# Patient Record
Sex: Female | Born: 1950 | Race: Black or African American | Hispanic: No | Marital: Single | Smoking: Never smoker
Health system: Southern US, Community
[De-identification: ages and names within clinical notes are randomized; demographics above are authoritative.]

## PROBLEM LIST (undated history)

## (undated) DIAGNOSIS — I10 Essential (primary) hypertension: Secondary | ICD-10-CM

## (undated) DIAGNOSIS — I2510 Atherosclerotic heart disease of native coronary artery without angina pectoris: Secondary | ICD-10-CM

## (undated) DIAGNOSIS — E119 Type 2 diabetes mellitus without complications: Secondary | ICD-10-CM

## (undated) DIAGNOSIS — I509 Heart failure, unspecified: Secondary | ICD-10-CM

## (undated) DIAGNOSIS — N2581 Secondary hyperparathyroidism of renal origin: Secondary | ICD-10-CM

## (undated) DIAGNOSIS — K219 Gastro-esophageal reflux disease without esophagitis: Secondary | ICD-10-CM

## (undated) DIAGNOSIS — Z992 Dependence on renal dialysis: Secondary | ICD-10-CM

## (undated) DIAGNOSIS — D649 Anemia, unspecified: Secondary | ICD-10-CM

## (undated) DIAGNOSIS — J189 Pneumonia, unspecified organism: Secondary | ICD-10-CM

## (undated) DIAGNOSIS — I639 Cerebral infarction, unspecified: Secondary | ICD-10-CM

## (undated) DIAGNOSIS — E785 Hyperlipidemia, unspecified: Secondary | ICD-10-CM

## (undated) DIAGNOSIS — M4842XA Fatigue fracture of vertebra, cervical region, initial encounter for fracture: Secondary | ICD-10-CM

## (undated) DIAGNOSIS — G5621 Lesion of ulnar nerve, right upper limb: Secondary | ICD-10-CM

## (undated) DIAGNOSIS — N186 End stage renal disease: Secondary | ICD-10-CM

## (undated) DIAGNOSIS — E1140 Type 2 diabetes mellitus with diabetic neuropathy, unspecified: Secondary | ICD-10-CM

---

## 2014-06-24 DIAGNOSIS — E119 Type 2 diabetes mellitus without complications: Secondary | ICD-10-CM

## 2014-06-24 DIAGNOSIS — I10 Essential (primary) hypertension: Secondary | ICD-10-CM | POA: Diagnosis present

## 2016-02-25 ENCOUNTER — Other Ambulatory Visit: Payer: Self-pay | Admitting: Cardiology

## 2016-03-01 ENCOUNTER — Ambulatory Visit
Admission: RE | Admit: 2016-03-01 | Discharge: 2016-03-01 | Disposition: A | Discharge status: Home / Self Care | Payer: Medicare Other | Source: Ambulatory Visit | Attending: Cardiology | Admitting: Cardiology

## 2016-03-01 ENCOUNTER — Encounter: Payer: Self-pay | Admitting: *Deleted

## 2016-03-01 ENCOUNTER — Encounter
Admission: RE | Disposition: A | Discharge status: Home / Self Care | Payer: Self-pay | Source: Ambulatory Visit | Attending: Cardiology

## 2016-03-01 DIAGNOSIS — Z8249 Family history of ischemic heart disease and other diseases of the circulatory system: Secondary | ICD-10-CM | POA: Insufficient documentation

## 2016-03-01 DIAGNOSIS — Z7982 Long term (current) use of aspirin: Secondary | ICD-10-CM | POA: Diagnosis not present

## 2016-03-01 DIAGNOSIS — E782 Mixed hyperlipidemia: Secondary | ICD-10-CM | POA: Insufficient documentation

## 2016-03-01 DIAGNOSIS — I10 Essential (primary) hypertension: Secondary | ICD-10-CM | POA: Diagnosis not present

## 2016-03-01 DIAGNOSIS — I259 Chronic ischemic heart disease, unspecified: Secondary | ICD-10-CM | POA: Insufficient documentation

## 2016-03-01 DIAGNOSIS — Z880 Allergy status to penicillin: Secondary | ICD-10-CM | POA: Diagnosis not present

## 2016-03-01 DIAGNOSIS — R0789 Other chest pain: Secondary | ICD-10-CM | POA: Insufficient documentation

## 2016-03-01 DIAGNOSIS — I208 Other forms of angina pectoris: Secondary | ICD-10-CM

## 2016-03-01 DIAGNOSIS — Z794 Long term (current) use of insulin: Secondary | ICD-10-CM | POA: Insufficient documentation

## 2016-03-01 DIAGNOSIS — Z79899 Other long term (current) drug therapy: Secondary | ICD-10-CM | POA: Diagnosis not present

## 2016-03-01 DIAGNOSIS — E119 Type 2 diabetes mellitus without complications: Secondary | ICD-10-CM | POA: Insufficient documentation

## 2016-03-01 DIAGNOSIS — R0600 Dyspnea, unspecified: Secondary | ICD-10-CM | POA: Diagnosis present

## 2016-03-01 HISTORY — PX: CARDIAC CATHETERIZATION: SHX172

## 2016-03-01 HISTORY — DX: Essential (primary) hypertension: I10

## 2016-03-01 HISTORY — DX: Atherosclerotic heart disease of native coronary artery without angina pectoris: I25.10

## 2016-03-01 LAB — GLUCOSE, CAPILLARY: GLUCOSE-CAPILLARY: 100 mg/dL — AB (ref 65–99)

## 2016-03-01 SURGERY — LEFT HEART CATH AND CORONARY ANGIOGRAPHY
Anesthesia: Moderate Sedation | Laterality: Left

## 2016-03-01 MED ORDER — FENTANYL CITRATE (PF) 100 MCG/2ML IJ SOLN
INTRAMUSCULAR | Status: DC | PRN
  Administered 2016-03-01: 25 ug via INTRAVENOUS

## 2016-03-01 MED ORDER — ATROPINE SULFATE 1 MG/10ML IJ SOSY
PREFILLED_SYRINGE | INTRAMUSCULAR | Status: AC
  Filled 2016-03-01: qty 20

## 2016-03-01 MED ORDER — IOPAMIDOL (ISOVUE-300) INJECTION 61%
INTRAVENOUS | Status: DC | PRN
  Administered 2016-03-01: 90 mL via INTRAVENOUS

## 2016-03-01 MED ORDER — SODIUM CHLORIDE 0.9 % WEIGHT BASED INFUSION
3.0000 mL/kg/h | INTRAVENOUS | Status: DC
  Administered 2016-03-01: 3 mL/kg/h via INTRAVENOUS

## 2016-03-01 MED ORDER — SODIUM CHLORIDE 0.9% FLUSH: 3.0000 mL | Freq: Two times a day (BID) | INTRAVENOUS | Status: DC

## 2016-03-01 MED ORDER — MIDAZOLAM HCL 2 MG/2ML IJ SOLN
INTRAMUSCULAR | Status: DC | PRN
  Administered 2016-03-01: 1 mg via INTRAVENOUS

## 2016-03-01 MED ORDER — HEPARIN (PORCINE) IN NACL 2-0.9 UNIT/ML-% IJ SOLN
INTRAMUSCULAR | Status: AC
  Filled 2016-03-01: qty 1000

## 2016-03-01 MED ORDER — EPINEPHRINE HCL 0.1 MG/ML IJ SOSY
PREFILLED_SYRINGE | INTRAMUSCULAR | Status: AC
  Filled 2016-03-01: qty 20

## 2016-03-01 MED ORDER — SODIUM CHLORIDE 0.9% FLUSH: 3.0000 mL | INTRAVENOUS | Status: DC | PRN

## 2016-03-01 MED ORDER — ASPIRIN 81 MG PO CHEW
81.0000 mg | CHEWABLE_TABLET | ORAL | Status: DC
Start: 2016-03-02 — End: 2016-03-01

## 2016-03-01 MED ORDER — FENTANYL CITRATE (PF) 100 MCG/2ML IJ SOLN
INTRAMUSCULAR | Status: AC
  Filled 2016-03-01: qty 2

## 2016-03-01 MED ORDER — MIDAZOLAM HCL 2 MG/2ML IJ SOLN
INTRAMUSCULAR | Status: AC
  Filled 2016-03-01: qty 2

## 2016-03-01 MED ORDER — SODIUM CHLORIDE 0.9 % IV SOLN: 250.0000 mL | INTRAVENOUS | Status: DC | PRN

## 2016-03-01 MED ORDER — SODIUM CHLORIDE 0.9 % WEIGHT BASED INFUSION: 1.0000 mL/kg/h | INTRAVENOUS | Status: DC

## 2016-03-01 MED ORDER — MAGNESIUM SULFATE 50 % IJ SOLN
INTRAMUSCULAR | Status: AC
  Filled 2016-03-01: qty 2

## 2016-03-01 SURGICAL SUPPLY — 9 items
CATH INFINITI 5FR ANG PIGTAIL (CATHETERS) ×3 IMPLANT
CATH INFINITI 5FR JL4 (CATHETERS) ×3 IMPLANT
CATH INFINITI JR4 5F (CATHETERS) ×3 IMPLANT
DEVICE CLOSURE MYNXGRIP 5F (Vascular Products) ×3 IMPLANT
KIT MANI 3VAL PERCEP (MISCELLANEOUS) ×3 IMPLANT
NEEDLE PERC 18GX7CM (NEEDLE) ×3 IMPLANT
PACK CARDIAC CATH (CUSTOM PROCEDURE TRAY) ×3 IMPLANT
SHEATH PINNACLE 5F 10CM (SHEATH) ×3 IMPLANT
WIRE EMERALD 3MM-J .035X150CM (WIRE) ×3 IMPLANT

## 2016-03-01 NOTE — Progress Notes (Signed)
Pt clinically stable post heart cath, no bleeding nor hematoma at right groin site, Dr Ubaldo Glassing in to see pt with questions answered, ready for discharge with rtc appt given,

## 2016-03-01 NOTE — Discharge Instructions (Signed)

## 2017-12-09 ENCOUNTER — Other Ambulatory Visit: Payer: Self-pay

## 2017-12-09 ENCOUNTER — Emergency Department
Admission: EM | Admit: 2017-12-09 | Discharge: 2017-12-09 | Disposition: A | Discharge status: Home / Self Care | Payer: Medicare Other | Attending: Emergency Medicine | Admitting: Emergency Medicine

## 2017-12-09 ENCOUNTER — Emergency Department: Payer: Medicare Other

## 2017-12-09 DIAGNOSIS — R0789 Other chest pain: Secondary | ICD-10-CM | POA: Diagnosis present

## 2017-12-09 DIAGNOSIS — I2510 Atherosclerotic heart disease of native coronary artery without angina pectoris: Secondary | ICD-10-CM | POA: Insufficient documentation

## 2017-12-09 DIAGNOSIS — I10 Essential (primary) hypertension: Secondary | ICD-10-CM | POA: Diagnosis not present

## 2017-12-09 DIAGNOSIS — R0602 Shortness of breath: Secondary | ICD-10-CM | POA: Diagnosis not present

## 2017-12-09 DIAGNOSIS — Z79899 Other long term (current) drug therapy: Secondary | ICD-10-CM | POA: Diagnosis not present

## 2017-12-09 DIAGNOSIS — R079 Chest pain, unspecified: Secondary | ICD-10-CM

## 2017-12-09 DIAGNOSIS — Z7982 Long term (current) use of aspirin: Secondary | ICD-10-CM | POA: Diagnosis not present

## 2017-12-09 LAB — CBC WITH DIFFERENTIAL/PLATELET
Basophils Absolute: 0.1 10*3/uL (ref 0–0.1)
Basophils Relative: 1 %
Eosinophils Absolute: 0.2 10*3/uL (ref 0–0.7)
Eosinophils Relative: 3 %
HEMATOCRIT: 31.7 % — AB (ref 35.0–47.0)
Hemoglobin: 10.3 g/dL — ABNORMAL LOW (ref 12.0–16.0)
LYMPHS ABS: 2 10*3/uL (ref 1.0–3.6)
LYMPHS PCT: 23 %
MCH: 27.3 pg (ref 26.0–34.0)
MCHC: 32.4 g/dL (ref 32.0–36.0)
MCV: 84.2 fL (ref 80.0–100.0)
MONO ABS: 0.6 10*3/uL (ref 0.2–0.9)
MONOS PCT: 7 %
NEUTROS ABS: 5.8 10*3/uL (ref 1.4–6.5)
Neutrophils Relative %: 66 %
Platelets: 218 10*3/uL (ref 150–440)
RBC: 3.77 MIL/uL — ABNORMAL LOW (ref 3.80–5.20)
RDW: 14.6 % — AB (ref 11.5–14.5)
WBC: 8.7 10*3/uL (ref 3.6–11.0)

## 2017-12-09 LAB — TROPONIN I
Troponin I: <0.03 ng/mL (ref <0.03)
Troponin I: <0.03 ng/mL (ref <0.03)

## 2017-12-09 LAB — COMPREHENSIVE METABOLIC PANEL
ALBUMIN: 3.7 g/dL (ref 3.5–5.0)
ALK PHOS: 178 U/L — AB (ref 38–126)
ALT: 26 U/L (ref 14–54)
ANION GAP: 8 (ref 5–15)
AST: 36 U/L (ref 15–41)
BUN: 29 mg/dL — ABNORMAL HIGH (ref 6–20)
CO2: 22 mmol/L (ref 22–32)
Calcium: 9 mg/dL (ref 8.9–10.3)
Chloride: 110 mmol/L (ref 101–111)
Creatinine, Ser: 1.68 mg/dL — ABNORMAL HIGH (ref 0.44–1.00)
GFR calc Af Amer: 36 mL/min — ABNORMAL LOW (ref >60)
GFR calc non Af Amer: 31 mL/min — ABNORMAL LOW (ref >60)
GLUCOSE: 160 mg/dL — AB (ref 65–99)
Potassium: 4.8 mmol/L (ref 3.5–5.1)
SODIUM: 140 mmol/L (ref 135–145)
Total Bilirubin: 0.6 mg/dL (ref 0.3–1.2)
Total Protein: 8 g/dL (ref 6.5–8.1)

## 2017-12-09 MED ORDER — ASPIRIN 81 MG PO CHEW
324.0000 mg | CHEWABLE_TABLET | Freq: Once | ORAL | Status: AC
  Administered 2017-12-09: 324 mg via ORAL
  Filled 2017-12-09: qty 4

## 2017-12-09 NOTE — ED Provider Notes (Signed)
[facility] Provider Note ____________________________________________   I have reviewed the triage vital signs and the triage nursing note.  HISTORY  Chief Complaint Chest Pain   Historian Patient  HPI Deanna [name] is a 67 y.o. female presents by EMS from [facility] with episode of chest pain this morning.  She states that she was bending over to get change when she was down low she had catching pain over the left lower chest wall.  When she stood up she felt like it was severe and sharp and she felt short of breath with it.  Patient states there was a nurse in the area who told her she should go to get checked out.  Patient has primary care at [facility].  Patient reports cardiac cath about 3 years ago and was told there is no problems.  She does take blood pressure medication.  States she was told that she should not take daily aspirin.  But no reported allergy.  No recent illness or coughing or fevers.  No pleuritic chest pain.  Chest pain lasted probably less than 15 or 20 minutes and is currently gone.  Denies indigestion.  Denies trauma or overuse or pulled muscle.     Past Medical History:  Diagnosis Date  . Coronary artery disease   . Hypertension     Patient Active Problem List   Diagnosis Date Noted  . 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 CARDIAC CATHETERIZATION Left 03/01/2016   Procedure: Left Heart Cath and Coronary Angiography;  Surgeon: Teodoro Spray, MD;  Location: [facility];  Service: Cardiovascular;  Laterality: Left;    Prior to Admission medications   Medication Sig Start Date End Date Taking? Authorizing Provider  aspirin EC 81 MG tablet Take 81 mg by mouth daily.    [provider]  atorvastatin (LIPITOR) 40 MG tablet Take 40 mg by mouth daily.    [provider]  gabapentin (NEURONTIN) 100 MG capsule  Take 300 mg by mouth.    [provider]  insulin aspart protamine- aspart (NOVOLOG MIX 70/30) (70-30) 100 UNIT/ML injection Inject into the skin.    [provider]  insulin glargine (LANTUS) 100 UNIT/ML injection Inject 65 Units into the skin at bedtime.    [provider]  lisinopril-hydrochlorothiazide (PRINZIDE,ZESTORETIC) 20-12.5 MG tablet Take 1 tablet by mouth daily.    [provider]  metFORMIN (GLUCOPHAGE) 500 MG tablet Take 500 mg by mouth 2 (two) times daily with a meal.    [provider]  metoprolol succinate (TOPROL-XL) 100 MG 24 hr tablet Take 100 mg by mouth daily. Take with or immediately following a meal.    [provider]    Allergies  Allergen Reactions  . Penicillins Rash    History reviewed. No pertinent family history.  Social History Social History   Tobacco Use  . Smoking status: Never Smoker  Substance Use Topics  . Alcohol use: No  . Drug use: No    Review of Systems  Constitutional: Negative for fever. Eyes: Negative for visual changes. ENT: Negative for sore throat. Cardiovascular: Positive as per HPI for chest pain. Respiratory: She felt a little short of breath during the episode, but none now.  No recent coughing or fevers.   Gastrointestinal: Negative for abdominal pain, vomiting and diarrhea. Genitourinary: Negative for dysuria. Musculoskeletal: Negative for back pain. Skin: Negative for rash. Neurological:  Negative for headache.  ____________________________________________   PHYSICAL EXAM:  VITAL SIGNS: ED Triage Vitals  Enc Vitals Group     BP      Pulse      Resp      Temp      Temp src      SpO2      Weight      Height      Head Circumference      Peak Flow      Pain Score      Pain Loc      Pain Edu?      Excl. in [place]?      Constitutional: Alert and oriented. Well appearing and in no distress. HEENT   Head: Normocephalic and atraumatic.      Eyes:  Conjunctivae are normal. Pupils equal and round.       Ears:         Nose: No congestion/rhinnorhea.   Mouth/Throat: Mucous membranes are moist.   Neck: No stridor. Cardiovascular/Chest: Normal rate, regular rhythm.  No murmurs, rubs, or gallops. Respiratory: Normal respiratory effort without tachypnea nor retractions. Breath sounds are clear and equal bilaterally. No wheezes/rales/rhonchi. Gastrointestinal: Soft. No distention, no guarding, no rebound. Nontender.    Genitourinary/rectal:Deferred Musculoskeletal: Nontender with normal range of motion in all extremities. No joint effusions.  No lower extremity tenderness.  No edema. Neurologic:  Normal speech and language. No gross or focal neurologic deficits are appreciated. Skin:  Skin is warm, dry and intact. No rash noted. Psychiatric: Mood and affect are normal. Speech and behavior are normal. Patient exhibits appropriate insight and judgment.   ____________________________________________  LABS (pertinent positives/negatives) I, Lisa Roca, MD the attending physician have reviewed the labs noted below.  Labs Reviewed  COMPREHENSIVE METABOLIC PANEL - Abnormal; Notable for the following components:      Result Value   Glucose, Bld 160 (*)    BUN 29 (*)    Creatinine, Ser 1.68 (*)    Alkaline Phosphatase 178 (*)    GFR calc non Af Amer 31 (*)    GFR calc Af Amer 36 (*)    All other components within normal limits  CBC WITH DIFFERENTIAL/PLATELET - Abnormal; Notable for the following components:   RBC 3.77 (*)    Hemoglobin 10.3 (*)    HCT 31.7 (*)    RDW 14.6 (*)    All other components within normal limits  TROPONIN I  TROPONIN I    ____________________________________________    EKG I, Lisa Roca, MD, the attending physician have personally viewed and interpreted all ECGs.  94 bpm. normal sinus rhythm.  Narrow QS.  Normal axis.  Normal ST and T  wave ____________________________________________  RADIOLOGY   Chest x-ray two-view reviewed and interpreted by myself: no pneumonia or edema  Radiologist interpretation:FINDINGS: Cardiac contours upper limits of normal. No consolidative pulmonary opacities. No pleural effusion or pneumothorax. Left basilar atelectasis. Thoracic spine degenerative changes.  IMPRESSION: Left basilar atelectasis. __________________________________________  PROCEDURES  Procedure(s) performed: None  Procedures  Critical Care performed: None   ____________________________________________  ED COURSE / ASSESSMENT AND PLAN  Pertinent labs & imaging results that were available during my care of the patient were reviewed by me and considered in my medical decision making (see chart for details).   Chart review indicates patient had seen Dr. Ubaldo Glassing in 2017 and had a cardiac cath at that point time which showed no intervenable lesions.  She did have coronary disease with 100%  RCA lesion but she had collateral flow to the distal RCA.  No interventions at that time.  Does not look like she has had follow-up with cardiology since that point time.  At that point time she was on 81 mg aspirin.  Quite unclear to me why she would be told not to be on aspirin right now.  She reports no history of internal bleeding.  She does have stage III kidney disease.  We discussed that given no allergy, one-time dosing today seems safe and reasonable.  Initial laboratory studies are all reassuring.  As is her initial EKG.  Patient was really hesitant to stay for repeat testing, but chose to repeat at about 2 hours as a compromise.  She understands that typically I would retest at 3-4 hours but she is not willing to wait that long.  Unclear etiology of the chest discomfort episode, but is gone now, seems unlikely ACS.  Potentially musculoskeletal or even esophageal spasm.  We discussed return precautions and were  to follow-up.  DIFFERENTIAL DIAGNOSIS: Differential diagnosis includes, but is not limited to, ACS, aortic dissection, pulmonary embolism, cardiac tamponade, pneumothorax, pneumonia, pericarditis, myocarditis, GI-related causes including esophagitis/gastritis, and musculoskeletal chest wall pain.   CONSULTATIONS:  None   Patient / Family / Caregiver informed of clinical course, medical decision-making process, and agree with plan.   I discussed return precautions, follow-up instructions, and discharge instructions with patient and/or family.  Discharge Instructions :You are evaluated for chest discomfort, and although no certain cause was found, your exam and evaluation are overall reassuring in the emergency department today.  Return to emergency department immediately for any worsening or uncontrolled chest pain, nausea, sweats, weakness, numbness, dizziness passing out, or any other symptoms concerning to you.    ___________________________________________   FINAL CLINICAL IMPRESSION(S) / ED DIAGNOSES   Final diagnoses:  Nonspecific chest pain      ___________________________________________        Note: This dictation was prepared with Dragon dictation. Any transcriptional errors that result from this process are unintentional    Lisa Roca, MD 12/09/17 1439

## 2017-12-09 NOTE — Discharge Instructions (Signed)
You are evaluated for chest discomfort, and although no certain cause was found, your exam and evaluation are overall reassuring in the emergency department today.  Return to emergency department immediately for any worsening or uncontrolled chest pain, nausea, sweats, weakness, numbness, dizziness passing out, or any other symptoms concerning to you.

## 2017-12-09 NOTE — ED Triage Notes (Signed)
Pt arrived via [facility] after she was at work and developed chest pain, on arrival here chest pain has resolved

## 2018-04-18 ENCOUNTER — Other Ambulatory Visit: Payer: Self-pay | Admitting: Internal Medicine

## 2018-04-18 DIAGNOSIS — Z13820 Encounter for screening for osteoporosis: Secondary | ICD-10-CM

## 2018-05-09 ENCOUNTER — Ambulatory Visit
Admission: RE | Admit: 2018-05-09 | Discharge: 2018-05-09 | Disposition: A | Discharge status: Home / Self Care | Payer: Medicare Other | Source: Ambulatory Visit | Attending: Internal Medicine | Admitting: Internal Medicine

## 2018-05-09 DIAGNOSIS — Z13820 Encounter for screening for osteoporosis: Secondary | ICD-10-CM | POA: Insufficient documentation

## 2018-05-09 DIAGNOSIS — M85851 Other specified disorders of bone density and structure, right thigh: Secondary | ICD-10-CM | POA: Diagnosis not present

## 2021-05-27 ENCOUNTER — Other Ambulatory Visit: Payer: Self-pay | Admitting: Family Medicine

## 2021-05-27 ENCOUNTER — Ambulatory Visit
Admission: RE | Admit: 2021-05-27 | Discharge: 2021-05-27 | Disposition: A | Discharge status: Home / Self Care | Payer: Medicare HMO | Source: Ambulatory Visit | Attending: Family Medicine | Admitting: Family Medicine

## 2021-05-27 ENCOUNTER — Ambulatory Visit
Admission: RE | Admit: 2021-05-27 | Discharge: 2021-05-27 | Disposition: A | Discharge status: Home / Self Care | Payer: Medicare HMO | Attending: Family Medicine | Admitting: Family Medicine

## 2021-05-27 DIAGNOSIS — R0609 Other forms of dyspnea: Secondary | ICD-10-CM

## 2021-05-27 DIAGNOSIS — R0600 Dyspnea, unspecified: Secondary | ICD-10-CM

## 2021-09-12 ENCOUNTER — Other Ambulatory Visit: Payer: Self-pay | Admitting: Family Medicine

## 2021-09-12 DIAGNOSIS — Z1231 Encounter for screening mammogram for malignant neoplasm of breast: Secondary | ICD-10-CM

## 2021-09-23 ENCOUNTER — Emergency Department
Admission: EM | Admit: 2021-09-23 | Discharge: 2021-09-23 | Disposition: A | Discharge status: Home / Self Care | Payer: Medicare HMO | Attending: Emergency Medicine | Admitting: Emergency Medicine

## 2021-09-23 ENCOUNTER — Other Ambulatory Visit: Payer: Self-pay

## 2021-09-23 DIAGNOSIS — H6992 Unspecified Eustachian tube disorder, left ear: Secondary | ICD-10-CM | POA: Insufficient documentation

## 2021-09-23 DIAGNOSIS — Z7982 Long term (current) use of aspirin: Secondary | ICD-10-CM | POA: Insufficient documentation

## 2021-09-23 DIAGNOSIS — I2510 Atherosclerotic heart disease of native coronary artery without angina pectoris: Secondary | ICD-10-CM | POA: Insufficient documentation

## 2021-09-23 DIAGNOSIS — Z79899 Other long term (current) drug therapy: Secondary | ICD-10-CM | POA: Insufficient documentation

## 2021-09-23 DIAGNOSIS — I10 Essential (primary) hypertension: Secondary | ICD-10-CM | POA: Insufficient documentation

## 2021-09-23 DIAGNOSIS — Z955 Presence of coronary angioplasty implant and graft: Secondary | ICD-10-CM | POA: Insufficient documentation

## 2021-09-23 DIAGNOSIS — H60392 Other infective otitis externa, left ear: Secondary | ICD-10-CM | POA: Diagnosis not present

## 2021-09-23 DIAGNOSIS — H6121 Impacted cerumen, right ear: Secondary | ICD-10-CM | POA: Insufficient documentation

## 2021-09-23 DIAGNOSIS — H9201 Otalgia, right ear: Secondary | ICD-10-CM | POA: Diagnosis present

## 2021-09-23 DIAGNOSIS — H6982 Other specified disorders of Eustachian tube, left ear: Secondary | ICD-10-CM

## 2021-09-23 MED ORDER — NEOMYCIN-POLYMYXIN-HC 3.5-10000-1 OT SOLN: 3.0000 [drp] | Freq: Four times a day (QID) | OTIC | 0 refills | Status: AC

## 2021-09-23 MED ORDER — FLUTICASONE PROPIONATE 50 MCG/ACT NA SUSP: 1.0000 | Freq: Two times a day (BID) | NASAL | 0 refills | Status: DC

## 2021-09-23 MED ORDER — CARBAMIDE PEROXIDE 6.5 % OT SOLN: 5.0000 [drp] | Freq: Two times a day (BID) | OTIC | 1 refills | Status: DC

## 2021-09-23 NOTE — ED Notes (Signed)
Pt complains of pain in both ears and hearing loss. Denies any other symptom

## 2021-09-23 NOTE — ED Triage Notes (Signed)
C/o hearing loss to right ear x3 months, and acute hearing loss with drainage and pain to left ear x2 weeks. Denies fevers. Pt. States she has had colds with cough intermittently x2 months. No meds today.

## 2021-09-23 NOTE — ED Provider Notes (Signed)
[facility] Emergency Department Provider Note  ____________________________________________  Time seen: Approximately 10:21 PM  I have reviewed the triage vital signs and the nursing notes.   HISTORY  Chief Complaint Ear Drainage (C/o hearing loss to right ear x3 months, and acute hearing loss with drainage and pain to left ear x2 weeks. Denies fevers. Pt. States she has had colds with cough intermittently x2 months.)    HPI Deanna [name] is a 70 y.o. female who presents to the emergency department complaining of bilateral ear issues.  Patient states that she is having some muffled hearing as well as left ear pain to the right, she states that it feels like there is something in her right ear.  No fevers or chills, nasal congestion, sore throat, cough.  She has been using water and hydrogen peroxide in both ears trying to alleviate symptoms.       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 Authorizing Provider  carbamide peroxide (DEBROX) 6.5 % OTIC solution Place 5 drops into the right ear 2 (two) times daily. 09/23/21 09/23/22 Yes Kelsee Preslar, Charline Bills, PA-C  fluticasone (FLONASE) 50 MCG/ACT nasal spray Place 1 spray into both nostrils 2 (two) times daily. 09/23/21  Yes Leviticus Harton, Charline Bills, PA-C  neomycin-polymyxin-hydrocortisone (CORTISPORIN) OTIC solution Place 3 drops into the left ear 4 (four) times daily for 10 days. 09/23/21 10/03/21 Yes Enrrique Mierzwa, Charline Bills, PA-C  aspirin EC 81 MG  tablet Take 81 mg by mouth daily.    [provider]  atorvastatin (LIPITOR) 40 MG tablet Take 40 mg by mouth daily.    [provider]  gabapentin (NEURONTIN) 100 MG capsule Take 300 mg by mouth.    [provider]  insulin aspart protamine- aspart (NOVOLOG MIX 70/30) (70-30) 100 UNIT/ML injection Inject into the skin.    [provider]  insulin glargine (LANTUS) 100 UNIT/ML injection Inject 65 Units into the skin at bedtime.    [provider]  lisinopril-hydrochlorothiazide (PRINZIDE,ZESTORETIC) 20-12.5 MG tablet Take 1 tablet by mouth daily.    [provider]  metFORMIN (GLUCOPHAGE) 500 MG tablet Take 500 mg by mouth 2 (two) times daily with a meal.    [provider]  metoprolol succinate (TOPROL-XL) 100 MG 24 hr tablet Take 100 mg by mouth daily. Take with or immediately following a meal.    [provider]    Allergies Penicillins  No family history on file.  Social History Social History   Tobacco Use   Smoking status: Never  Substance Use Topics   Alcohol use: No   Drug use: No     Review of Systems  Constitutional: No fever/chills Eyes: No visual changes. No discharge ENT: Bilateral ear issues Cardiovascular: no chest pain. Respiratory: no cough. No SOB. Gastrointestinal: No abdominal pain.  No nausea, no vomiting.  No diarrhea.  No constipation. Musculoskeletal: Negative for musculoskeletal pain. Skin: Negative for rash, abrasions, lacerations, ecchymosis. Neurological: Negative for headaches, focal weakness or numbness.  10 System ROS otherwise negative.  ____________________________________________   PHYSICAL EXAM:  VITAL SIGNS: ED Triage Vitals  Enc Vitals Group     BP 09/23/21 2052 (!) 143/71     Pulse Rate 09/23/21 2052 95     Resp --      Temp 09/23/21 2052 98.9 F (37.2 C)     Temp Source 09/23/21 2052 Oral     SpO2 09/23/21 2052 98 %     Weight --      Height --       Head Circumference --      Peak Flow --      Pain Score 09/23/21 2056 0     Pain Loc --      Pain Edu? --      Excl. in [place]? --      Constitutional: Alert and oriented. Well appearing and in no acute distress. Eyes: Conjunctivae are normal. PERRL. EOMI. Head: Atraumatic. ENT:      Ears: Visualization of the left EAC reveals erythema and edema consistent with otitis externa.  Canal is clear of cerumen.  TM is slightly bulging consistent with eustachian tube dysfunction but no injection concerning for otitis media.  EAC on right is completely blocked with hardened cerumen.  Unable to dislodge with ear curette.  Unable to see TM at this time.      Nose: No congestion/rhinnorhea.      Mouth/Throat: Mucous membranes are moist.  Neck: No stridor.   Cardiovascular: Normal rate, regular rhythm. Normal S1 and S2.  Good peripheral circulation. Respiratory: Normal respiratory effort without tachypnea or retractions. Lungs CTAB. Good air entry to the bases with no decreased or absent breath sounds. Musculoskeletal: Full range of motion to all extremities. No gross deformities appreciated. Neurologic:  Normal speech and language. No gross focal neurologic deficits are appreciated.  Skin:  Skin is warm, dry and intact. No rash noted. Psychiatric: Mood and affect are normal. Speech and behavior are normal. Patient exhibits appropriate insight and judgement.   ____________________________________________   LABS (all labs ordered are listed, but only abnormal results are displayed)  Labs Reviewed - No data to display ____________________________________________  EKG   ____________________________________________  RADIOLOGY   No results found.  ____________________________________________    PROCEDURES  Procedure(s) performed:    Procedures    Medications - No data to display   ____________________________________________   INITIAL IMPRESSION / ASSESSMENT AND PLAN / ED  COURSE  Pertinent labs & imaging results that were available during my care of the patient were reviewed by me and considered in my medical decision making (see chart for details).  Review of the [place] CSRS was performed in accordance of the [place] prior to dispensing any controlled drugs.           Patient's diagnosis is consistent with otitis externa and eustachian tube dysfunction to the left ear, cerumen impaction to the right ear.  Patient presents with bilateral ear issues.  Patiently placed on Debrox drops for cerumen impaction, Cortisporin drops for otitis externa and Flonase for eustachian tube dysfunction.  Follow-up primary care or ENT as needed.  Return precautions discussed with the patient..  Patient is given ED precautions to return to the ED for any worsening or new symptoms.     ____________________________________________  FINAL CLINICAL IMPRESSION(S) / ED DIAGNOSES  Final diagnoses:  Impacted cerumen of right ear  Other infective acute otitis externa of left ear  Eustachian tube dysfunction, left      NEW  MEDICATIONS STARTED DURING THIS VISIT:  ED Discharge Orders          Ordered    fluticasone (FLONASE) 50 MCG/ACT nasal spray  2 times daily        09/23/21 2233    carbamide peroxide (DEBROX) 6.5 % OTIC solution  2 times daily        09/23/21 2233    neomycin-polymyxin-hydrocortisone (CORTISPORIN) OTIC solution  4 times daily        09/23/21 2233                This chart was dictated using voice recognition software/Dragon. Despite best efforts to proofread, errors can occur which can change the meaning. Any change was purely unintentional.    Darletta Moll, PA-C 09/23/21 2235    Naaman Plummer, MD 09/24/21 [phone]

## 2021-10-31 DIAGNOSIS — I5020 Unspecified systolic (congestive) heart failure: Secondary | ICD-10-CM | POA: Diagnosis present

## 2021-12-24 DIAGNOSIS — T464X5A Adverse effect of angiotensin-converting-enzyme inhibitors, initial encounter: Secondary | ICD-10-CM

## 2021-12-24 HISTORY — DX: Adverse effect of angiotensin-converting-enzyme inhibitors, initial encounter: T46.4X5A

## 2021-12-26 ENCOUNTER — Emergency Department: Payer: Medicare HMO

## 2021-12-26 ENCOUNTER — Inpatient Hospital Stay
Admission: EM | Admit: 2021-12-26 | Discharge: 2022-01-13 | DRG: 064 | Disposition: A | Discharge status: Home Health | Payer: Medicare HMO | Attending: Internal Medicine | Admitting: Internal Medicine

## 2021-12-26 ENCOUNTER — Other Ambulatory Visit: Payer: Self-pay

## 2021-12-26 DIAGNOSIS — D649 Anemia, unspecified: Secondary | ICD-10-CM | POA: Diagnosis present

## 2021-12-26 DIAGNOSIS — Z7982 Long term (current) use of aspirin: Secondary | ICD-10-CM

## 2021-12-26 DIAGNOSIS — T380X5A Adverse effect of glucocorticoids and synthetic analogues, initial encounter: Secondary | ICD-10-CM | POA: Diagnosis not present

## 2021-12-26 DIAGNOSIS — J122 Parainfluenza virus pneumonia: Secondary | ICD-10-CM | POA: Diagnosis present

## 2021-12-26 DIAGNOSIS — M4802 Spinal stenosis, cervical region: Secondary | ICD-10-CM | POA: Diagnosis present

## 2021-12-26 DIAGNOSIS — R29702 NIHSS score 2: Secondary | ICD-10-CM | POA: Diagnosis not present

## 2021-12-26 DIAGNOSIS — R531 Weakness: Secondary | ICD-10-CM

## 2021-12-26 DIAGNOSIS — Z880 Allergy status to penicillin: Secondary | ICD-10-CM

## 2021-12-26 DIAGNOSIS — I429 Cardiomyopathy, unspecified: Secondary | ICD-10-CM | POA: Diagnosis present

## 2021-12-26 DIAGNOSIS — Z794 Long term (current) use of insulin: Secondary | ICD-10-CM

## 2021-12-26 DIAGNOSIS — R778 Other specified abnormalities of plasma proteins: Secondary | ICD-10-CM

## 2021-12-26 DIAGNOSIS — I639 Cerebral infarction, unspecified: Secondary | ICD-10-CM | POA: Diagnosis not present

## 2021-12-26 DIAGNOSIS — I2510 Atherosclerotic heart disease of native coronary artery without angina pectoris: Secondary | ICD-10-CM | POA: Diagnosis present

## 2021-12-26 DIAGNOSIS — R29706 NIHSS score 6: Secondary | ICD-10-CM | POA: Diagnosis not present

## 2021-12-26 DIAGNOSIS — R10A1 Flank pain, right side: Secondary | ICD-10-CM

## 2021-12-26 DIAGNOSIS — E1165 Type 2 diabetes mellitus with hyperglycemia: Secondary | ICD-10-CM | POA: Diagnosis not present

## 2021-12-26 DIAGNOSIS — Z955 Presence of coronary angioplasty implant and graft: Secondary | ICD-10-CM

## 2021-12-26 DIAGNOSIS — N186 End stage renal disease: Secondary | ICD-10-CM | POA: Diagnosis present

## 2021-12-26 DIAGNOSIS — E785 Hyperlipidemia, unspecified: Secondary | ICD-10-CM | POA: Diagnosis present

## 2021-12-26 DIAGNOSIS — E1140 Type 2 diabetes mellitus with diabetic neuropathy, unspecified: Secondary | ICD-10-CM | POA: Diagnosis present

## 2021-12-26 DIAGNOSIS — J181 Lobar pneumonia, unspecified organism: Secondary | ICD-10-CM

## 2021-12-26 DIAGNOSIS — G8191 Hemiplegia, unspecified affecting right dominant side: Secondary | ICD-10-CM | POA: Diagnosis present

## 2021-12-26 DIAGNOSIS — I10 Essential (primary) hypertension: Secondary | ICD-10-CM | POA: Diagnosis present

## 2021-12-26 DIAGNOSIS — N189 Chronic kidney disease, unspecified: Secondary | ICD-10-CM | POA: Diagnosis present

## 2021-12-26 DIAGNOSIS — Z992 Dependence on renal dialysis: Secondary | ICD-10-CM

## 2021-12-26 DIAGNOSIS — D631 Anemia in chronic kidney disease: Secondary | ICD-10-CM | POA: Diagnosis present

## 2021-12-26 DIAGNOSIS — M6281 Muscle weakness (generalized): Principal | ICD-10-CM

## 2021-12-26 DIAGNOSIS — N289 Disorder of kidney and ureter, unspecified: Secondary | ICD-10-CM

## 2021-12-26 DIAGNOSIS — E871 Hypo-osmolality and hyponatremia: Secondary | ICD-10-CM | POA: Diagnosis present

## 2021-12-26 DIAGNOSIS — N179 Acute kidney failure, unspecified: Secondary | ICD-10-CM | POA: Diagnosis present

## 2021-12-26 DIAGNOSIS — E1122 Type 2 diabetes mellitus with diabetic chronic kidney disease: Secondary | ICD-10-CM | POA: Diagnosis present

## 2021-12-26 DIAGNOSIS — E119 Type 2 diabetes mellitus without complications: Secondary | ICD-10-CM

## 2021-12-26 DIAGNOSIS — Z7984 Long term (current) use of oral hypoglycemic drugs: Secondary | ICD-10-CM

## 2021-12-26 DIAGNOSIS — Z9181 History of falling: Secondary | ICD-10-CM

## 2021-12-26 DIAGNOSIS — E875 Hyperkalemia: Secondary | ICD-10-CM

## 2021-12-26 DIAGNOSIS — I5020 Unspecified systolic (congestive) heart failure: Secondary | ICD-10-CM | POA: Diagnosis present

## 2021-12-26 DIAGNOSIS — J9601 Acute respiratory failure with hypoxia: Secondary | ICD-10-CM | POA: Diagnosis present

## 2021-12-26 DIAGNOSIS — R200 Anesthesia of skin: Secondary | ICD-10-CM

## 2021-12-26 DIAGNOSIS — N2581 Secondary hyperparathyroidism of renal origin: Secondary | ICD-10-CM | POA: Diagnosis present

## 2021-12-26 DIAGNOSIS — B181 Chronic viral hepatitis B without delta-agent: Secondary | ICD-10-CM | POA: Diagnosis present

## 2021-12-26 DIAGNOSIS — E8809 Other disorders of plasma-protein metabolism, not elsewhere classified: Secondary | ICD-10-CM | POA: Diagnosis present

## 2021-12-26 DIAGNOSIS — E8889 Other specified metabolic disorders: Secondary | ICD-10-CM | POA: Diagnosis present

## 2021-12-26 DIAGNOSIS — Z888 Allergy status to other drugs, medicaments and biological substances status: Secondary | ICD-10-CM

## 2021-12-26 DIAGNOSIS — R29701 NIHSS score 1: Secondary | ICD-10-CM | POA: Diagnosis present

## 2021-12-26 DIAGNOSIS — R109 Unspecified abdominal pain: Secondary | ICD-10-CM

## 2021-12-26 DIAGNOSIS — Z20822 Contact with and (suspected) exposure to covid-19: Secondary | ICD-10-CM | POA: Diagnosis present

## 2021-12-26 DIAGNOSIS — D472 Monoclonal gammopathy: Secondary | ICD-10-CM | POA: Diagnosis present

## 2021-12-26 DIAGNOSIS — R29703 NIHSS score 3: Secondary | ICD-10-CM | POA: Diagnosis not present

## 2021-12-26 DIAGNOSIS — J9811 Atelectasis: Secondary | ICD-10-CM | POA: Diagnosis present

## 2021-12-26 DIAGNOSIS — I5023 Acute on chronic systolic (congestive) heart failure: Secondary | ICD-10-CM | POA: Diagnosis present

## 2021-12-26 DIAGNOSIS — N170 Acute kidney failure with tubular necrosis: Secondary | ICD-10-CM | POA: Diagnosis present

## 2021-12-26 DIAGNOSIS — Z79899 Other long term (current) drug therapy: Secondary | ICD-10-CM

## 2021-12-26 DIAGNOSIS — J1008 Influenza due to other identified influenza virus with other specified pneumonia: Secondary | ICD-10-CM | POA: Diagnosis present

## 2021-12-26 DIAGNOSIS — R7989 Other specified abnormal findings of blood chemistry: Secondary | ICD-10-CM

## 2021-12-26 DIAGNOSIS — R29898 Other symptoms and signs involving the musculoskeletal system: Secondary | ICD-10-CM | POA: Diagnosis present

## 2021-12-26 DIAGNOSIS — I132 Hypertensive heart and chronic kidney disease with heart failure and with stage 5 chronic kidney disease, or end stage renal disease: Secondary | ICD-10-CM | POA: Diagnosis present

## 2021-12-26 DIAGNOSIS — E876 Hypokalemia: Secondary | ICD-10-CM | POA: Diagnosis present

## 2021-12-26 LAB — COMPREHENSIVE METABOLIC PANEL
ALT: 8 U/L (ref 0–44)
AST: 13 U/L — ABNORMAL LOW (ref 15–41)
Albumin: 3.2 g/dL — ABNORMAL LOW (ref 3.5–5.0)
Alkaline Phosphatase: 116 U/L (ref 38–126)
Anion gap: 10 (ref 5–15)
BUN: 47 mg/dL — ABNORMAL HIGH (ref 8–23)
CO2: 22 mmol/L (ref 22–32)
Calcium: 7.4 mg/dL — ABNORMAL LOW (ref 8.9–10.3)
Chloride: 104 mmol/L (ref 98–111)
Creatinine, Ser: 3.84 mg/dL — ABNORMAL HIGH (ref 0.44–1.00)
GFR, Estimated: 12 mL/min — ABNORMAL LOW (ref >60)
Glucose, Bld: 117 mg/dL — ABNORMAL HIGH (ref 70–99)
Potassium: 3.2 mmol/L — ABNORMAL LOW (ref 3.5–5.1)
Sodium: 136 mmol/L (ref 135–145)
Total Bilirubin: 0.4 mg/dL (ref 0.3–1.2)
Total Protein: 7.6 g/dL (ref 6.5–8.1)

## 2021-12-26 LAB — DIFFERENTIAL
Abs Immature Granulocytes: 0.05 10*3/uL (ref 0.00–0.07)
Basophils Absolute: 0 10*3/uL (ref 0.0–0.1)
Basophils Relative: 0 %
Eosinophils Absolute: 0.1 10*3/uL (ref 0.0–0.5)
Eosinophils Relative: 1 %
Immature Granulocytes: 1 %
Lymphocytes Relative: 20 %
Lymphs Abs: 1.9 10*3/uL (ref 0.7–4.0)
Monocytes Absolute: 0.6 10*3/uL (ref 0.1–1.0)
Monocytes Relative: 7 %
Neutro Abs: 6.9 10*3/uL (ref 1.7–7.7)
Neutrophils Relative %: 71 %

## 2021-12-26 LAB — CBC
HCT: 32.8 % — ABNORMAL LOW (ref 36.0–46.0)
Hemoglobin: 10.2 g/dL — ABNORMAL LOW (ref 12.0–15.0)
MCH: 26.5 pg (ref 26.0–34.0)
MCHC: 31.1 g/dL (ref 30.0–36.0)
MCV: 85.2 fL (ref 80.0–100.0)
Platelets: 234 10*3/uL (ref 150–400)
RBC: 3.85 MIL/uL — ABNORMAL LOW (ref 3.87–5.11)
RDW: 14.6 % (ref 11.5–15.5)
WBC: 9.6 10*3/uL (ref 4.0–10.5)
nRBC: 0 % (ref 0.0–0.2)

## 2021-12-26 LAB — PROTIME-INR
INR: 1 (ref 0.8–1.2)
Prothrombin Time: 13.1 seconds (ref 11.4–15.2)

## 2021-12-26 LAB — URINALYSIS, ROUTINE W REFLEX MICROSCOPIC
Bacteria, UA: NONE SEEN
Bilirubin Urine: NEGATIVE
Glucose, UA: 150 mg/dL — AB
Ketones, ur: NEGATIVE mg/dL
Nitrite: NEGATIVE
Protein, ur: 100 mg/dL — AB
Specific Gravity, Urine: 1.011 (ref 1.005–1.030)
pH: 5 (ref 5.0–8.0)

## 2021-12-26 LAB — APTT: aPTT: 30 seconds (ref 24–36)

## 2021-12-26 LAB — TROPONIN I (HIGH SENSITIVITY)
Troponin I (High Sensitivity): 22 ng/L — ABNORMAL HIGH (ref <18)
Troponin I (High Sensitivity): 24 ng/L — ABNORMAL HIGH (ref <18)

## 2021-12-26 LAB — BRAIN NATRIURETIC PEPTIDE: B Natriuretic Peptide: 203 pg/mL — ABNORMAL HIGH (ref 0.0–100.0)

## 2021-12-26 MED ORDER — SODIUM CHLORIDE 0.9% FLUSH: 3.0000 mL | Freq: Once | INTRAVENOUS | Status: DC

## 2021-12-26 MED ORDER — POTASSIUM CHLORIDE 10 MEQ/100ML IV SOLN
10.0000 meq | INTRAVENOUS | Status: AC
  Administered 2021-12-26 – 2021-12-27 (×2): 10 meq via INTRAVENOUS
  Filled 2021-12-26: qty 100

## 2021-12-26 MED ORDER — SODIUM CHLORIDE 0.9 % IV BOLUS
1000.0000 mL | Freq: Once | INTRAVENOUS | Status: AC
  Administered 2021-12-26: 1000 mL via INTRAVENOUS

## 2021-12-26 NOTE — ED Notes (Signed)
Report received from Katie, RN

## 2021-12-26 NOTE — ED Notes (Signed)
Dr. Patel at the bedside  

## 2021-12-26 NOTE — Assessment & Plan Note (Addendum)
Continue insulin Semglee.  NovoLog and sliding scale insulin.   ? ?

## 2021-12-26 NOTE — Assessment & Plan Note (Addendum)
Right-sided weakness not explained by MRI of the brain.  Neurology ordered MRI of the cervical spine and thoracic spine and an MRI of the brain. ? ?

## 2021-12-26 NOTE — ED Triage Notes (Addendum)
Pt comes with c/o numbness two days ago. Pt also recently started on new medication and since has been having several issues. Pt has been falling, feeling weak and dizziness.  ? ?Pt states she can't lift her right arm. Pt states the symptoms come and go.  ? ? ?

## 2021-12-26 NOTE — Assessment & Plan Note (Addendum)
Patient is euvolemic at this point.  With acute kidney injury holding diuretics.  Repeating echo with stroke. Last EF last year 30%. ?

## 2021-12-26 NOTE — Assessment & Plan Note (Addendum)
Restart Toprol low-dose at night. ?

## 2021-12-26 NOTE — Consult Note (Signed)
[facility] ?[facility] Tele[facility] Consult [facility] ? ?Stat Consult ? ?Patient Name:   Deanna [name], Deanna [name] ?Date of Birth:   03-Mar-1951 ?Identification Number:   MRN - [id number] ?Date of Service:   12/26/2021 20:23:05 ? ?Diagnosis: ?      I63.9 - Cerebrovascular accident (CVA), unspecified mechanism ([place]) ? ?Impression ?Patient presents to the hospital for right-sided sensorimotor symptoms. Onset 2 days ago, well outside the window for any acute stroke therapies. Recommend admission to the hospital for stroke work-up. MRI brain without contrast, TTE. Continue aspirin. Neuro follow-up. ? ?Our recommendations are outlined below. ? ?Disposition: ?Neurology will follow ? ? ? ?---------------------------------------------------------------------------------------------------- ?Advanced Imaging: ?Advanced Imaging Not Completed because: ? ?Subacute strokes on CT and/or LKW beyond 6-24 hrs per guidelines for thrombectomy consideration, vascular studies can be completed routinely on admission ? ? ? ?Metrics: ?[facility] Notification Time: 12/26/2021 20:21:00 ?Stamp Time: 12/26/2021 20:23:05 ?Callback Response Time: 12/26/2021 20:23:31 ? ? ?CT HEAD: ?As Per Radiologist CT Head Showed No Acute Hemorrhage or Acute Core Infarct ? ? ? ?---------------------------------------------------------------------------------------------------- ? ?Chief Complaint: ?Right-sided weakness ? ?History of Present Illness: ?Patient is a 71 year old Female. ?Patient presents to the hospital for right-sided weakness. Started 2 days ago. Involving the arm and leg. Never had this before. Denies any history of stroke. She does have a history of CKD, cardiomyopathy, CHF, hypertension, hyperlipidemia, diabetes, monoclonal gammopathy. On exam she has decreased sensation on the right side as well as right-sided weakness when compared to the left. She also seems to be having some pain in her right shoulder and right arm as well that seems to  be limiting some movements in her right arm which she says is also new over the past 2 days. ? ? ?Past Medical History: ?Other PMH:  See HPI Above ? ?Medications: ? ?No Anticoagulant use  ?Antiplatelet use: Yes asa ?Reviewed EMR for current medications ? ?Allergies:  ?Reviewed ? ?Social History: ?Drug Use: No ? ?Family History: ? ?There is no family history of premature cerebrovascular disease pertinent to this consultation ? ?ROS : ?14 Points Review of Systems was performed and was negative except mentioned in HPI. ? ?Past Surgical History: ?There Is No Surgical History Contributory To Today?s Visit ? ? ?Examination: ?BP(/), Pulse(80), ?1A: Level of Consciousness - Alert; keenly responsive + 0 ?1B: Ask Month and Age - Both Questions Right + 0 ?1C: Blink Eyes & Squeeze Hands - Performs Both Tasks + 0 ?2: Test Horizontal Extraocular Movements - Normal + 0 ?3: Test Visual Fields - No Visual Loss + 0 ?4: Test Facial Palsy (Use Grimace if Obtunded) - Normal symmetry + 0 ?5A: Test Left Arm Motor Drift - No Drift for 10 Seconds + 0 ?5B: Test Right Arm Motor Drift - No Effort Against Gravity + 3 ?6A: Test Left Leg Motor Drift - No Drift for 5 Seconds + 0 ?6B: Test Right Leg Motor Drift - Some Effort Against Gravity + 2 ?7: Test Limb Ataxia (FNF/Heel-Shin) - No Ataxia + 0 ?8: Test Sensation - Mild-Moderate Loss: Less Sharp/More Dull + 1 ?9: Test Language/Aphasia - Normal; No aphasia + 0 ?10: Test Dysarthria - Normal + 0 ?11: Test Extinction/Inattention - No abnormality + 0 ? ?NIHSS Score: 6 ? ? ? ? ?Patient / Family was informed the Neurology Consult would occur via TeleHealth consult by way of interactive audio and video telecommunications and consented to receiving care in this manner. ? ?Patient is being evaluated for possible acute neurologic impairment and high probability of imminent or life -  threatening deterioration.I spent total of 35 minutes providing care to this patient, including time for face to face visit  via telemedicine, review of medical records, imaging studies and discussion of findings with providers, the patient and / or family. ? ? ?Dr Knox Royalty ? ? ?[facility] ?[phone] ? ?Case [id number] ? ?

## 2021-12-26 NOTE — ED Notes (Signed)
ED Provider at bedside. 

## 2021-12-26 NOTE — ED Provider Notes (Signed)
? ?[facility] ?Provider Note ? ? Event Date/Time  ? First MD Initiated Contact with Patient 12/26/21 1839   ?  (approximate) ?History  ?Numbness ? ?HPI ?Deanna [name] is a 71 y.o. female with a stated past medical history of hypertension, CAD, and type 2 diabetes who presents for right upper and lower extremity numbness/weakness that began 2 days prior to arrival and has been worsening since onset.  Patient states that she cannot walk without dragging her right foot and has had to walk with a walker over the past 2 days.  Patient states that she has had recent medication changes but cannot tell me exactly what medications were changed.  Patient states that she is currently on gabapentin for neuropathy at 300 3 times daily but it is "not working anymore".  Patient also endorsing decreased p.o. intake over the last few weeks as well.  Patient denies any headache, vision changes, slurred speech, facial droop, or weakness/numbness/paresthesias in the left side ?Physical Exam  ?Triage Vital Signs: ?ED Triage Vitals  ?Enc Vitals Group  ?   BP 12/26/21 1621 139/75  ?   Pulse Rate 12/26/21 1621 89  ?   Resp 12/26/21 1621 19  ?   Temp 12/26/21 1621 98 ?F (36.7 ?C)  ?   Temp src --   ?   SpO2 12/26/21 1621 100 %  ?   Weight --   ?   Height --   ?   Head Circumference --   ?   Peak Flow --   ?   Pain Score 12/26/21 1619 5  ?   Pain Loc --   ?   Pain Edu? --   ?   Excl. in [place]? --   ? ?Most recent vital signs: ?Vitals:  ? 12/26/21 2145 12/26/21 2200  ?BP: (!) 163/86 (!) 168/83  ?Pulse: 94 (!) 101  ?Resp: 14 19  ?Temp:    ?SpO2: 100% 100%  ? ?General: Awake, oriented x4. ?CV:  Good peripheral perfusion.  ?Resp:  Normal effort.  ?Abd:  No distention.  ?Other:  Patient is an elderly African-American female laying in bed in no distress.  Significant weakness to the right upper extremity grip.  If I hold patient's right arm up, she is able to keep it straight out in front of her without drift however she is  unable to lift her arm into that position.  Patient's left lower extremity weakness and drift ?ED Results / Procedures / Treatments  ?Labs ?(all labs ordered are listed, but only abnormal results are displayed) ?Labs Reviewed  ?CBC - Abnormal; Notable for the following components:  ?    Result Value  ? RBC 3.85 (*)   ? Hemoglobin 10.2 (*)   ? HCT 32.8 (*)   ? All other components within normal limits  ?COMPREHENSIVE METABOLIC PANEL - Abnormal; Notable for the following components:  ? Potassium 3.2 (*)   ? Glucose, Bld 117 (*)   ? BUN 47 (*)   ? Creatinine, Ser 3.84 (*)   ? Calcium 7.4 (*)   ? Albumin 3.2 (*)   ? AST 13 (*)   ? GFR, Estimated 12 (*)   ? All other components within normal limits  ?BRAIN NATRIURETIC PEPTIDE - Abnormal; Notable for the following components:  ? B Natriuretic Peptide 203.0 (*)   ? All other components within normal limits  ?URINALYSIS, ROUTINE W REFLEX MICROSCOPIC - Abnormal; Notable for the following components:  ? Color, Urine  YELLOW (*)   ? APPearance HAZY (*)   ? Glucose, UA 150 (*)   ? Hgb urine dipstick SMALL (*)   ? Protein, ur 100 (*)   ? Leukocytes,Ua TRACE (*)   ? All other components within normal limits  ?TROPONIN I (HIGH SENSITIVITY) - Abnormal; Notable for the following components:  ? Troponin I (High Sensitivity) 22 (*)   ? All other components within normal limits  ?TROPONIN I (HIGH SENSITIVITY) - Abnormal; Notable for the following components:  ? Troponin I (High Sensitivity) 24 (*)   ? All other components within normal limits  ?DIFFERENTIAL  ?PROTIME-INR  ?APTT  ?CBG MONITORING, ED  ? ?EKG ?ED ECG REPORT ?I, Naaman Plummer, the attending physician, personally viewed and interpreted this ECG. ?Date: 12/26/2021 ?EKG Time: 1623 ?Rate: 91 ?Rhythm: normal sinus rhythm ?QRS Axis: normal ?Intervals: normal ?ST/T Wave abnormalities: normal ?Narrative Interpretation: no evidence of acute ischemia ?RADIOLOGY ?ED MD interpretation: CT of the head without contrast interpreted by me  shows no evidence of acute abnormalities including no intracerebral hemorrhage, obvious masses, or significant edema ?-Agree with radiology assessment ?Official radiology report(s): ?CT HEAD WO CONTRAST ? ?Result Date: 12/26/2021 ?CLINICAL DATA:  Intermittent numbness, weakness, and dizziness. Unable to lift right arm. EXAM: CT HEAD WITHOUT CONTRAST TECHNIQUE: Contiguous axial images were obtained from the base of the skull through the vertex without intravenous contrast. RADIATION DOSE REDUCTION: This exam was performed according to the departmental dose-optimization program which includes automated exposure control, adjustment of the mA and/or kV according to patient size and/or use of iterative reconstruction technique. COMPARISON:  None. FINDINGS: Brain: No evidence of acute infarction, hemorrhage, hydrocephalus, extra-axial collection or mass lesion/mass effect. Cerebral volume within normal limits for age. Scattered mild periventricular and subcortical white matter hypodensities are nonspecific, but favored to reflect chronic microvascular ischemic changes. Vascular: Atherosclerotic vascular calcification of the carotid siphons. No hyperdense vessel. Skull: Normal. Negative for fracture or focal lesion. Sinuses/Orbits: No acute finding. Scattered mild paranasal sinus mucosal thickening. The orbits are unremarkable. Other: None. IMPRESSION: 1. No acute intracranial abnormality. 2. Mild chronic microvascular ischemic changes. Electronically Signed   By: Titus Dubin M.D.   On: 12/26/2021 16:52   ?PROCEDURES: ?Critical Care performed: No ?.1-3 Lead EKG Interpretation ?Performed by: Naaman Plummer, MD ?Authorized by: Naaman Plummer, MD  ? ?  Interpretation: abnormal   ?  ECG rate:  102 ?  ECG rate assessment: tachycardic   ?  Rhythm: sinus tachycardia   ?  Ectopy: none   ?  Conduction: normal   ?MEDICATIONS ORDERED IN ED: ?Medications  ?potassium chloride 10 mEq in 100 mL IVPB (0 mEq Intravenous Paused 12/26/21  2225)  ?sodium chloride 0.9 % bolus 1,000 mL (0 mLs Intravenous Paused 12/26/21 2239)  ? ?IMPRESSION / MDM / ASSESSMENT AND PLAN / ED COURSE  ?I reviewed the triage vital signs and the nursing notes. ?             ?               ?The patient is on the cardiac monitor to evaluate for evidence of arrhythmia and/or significant heart rate changes. ?Patient is a 71 year old female who presents with symptoms concerning for nonacute stroke ?PMH risk factors: Hypertension, hyperlipidemia ?Neurologic Deficits: Right upper and lower extremity weakness, right upper and lower extremity numbness ?Last known Well Time: 2 days prior to arrival ?NIH Stroke Score: 4 ?Given History and Exam I have lower suspicion for infectious  etiology, neurologic changes secondary to toxicologic ingestion, seizure, complex migraine. ?Presentation concerning for possible stroke requiring workup. ? ?Workup: ?Labs: POC glucose, CBC, BMP, LFTs, Troponin, PT/INR, PTT, Type and Screen ?Other Diagnostics: ECG, CXR, non-contrast head CT followed by CTA brain and neck (to r/o large vessel occlusion amenable to thrombectomy) ?Interventions: Patient's not eligible for TPA due to time since symptom onset ? ?Consult: hospitalist ?Disposition: Admit ? ?  ?FINAL CLINICAL IMPRESSION(S) / ED DIAGNOSES  ? ?Final diagnoses:  ?Muscle weakness of right upper extremity  ?Weakness of right lower extremity  ?Right sided numbness  ? ?Rx / DC Orders  ? ?ED Discharge Orders   ? ? None  ? ?  ? ?Note:  This document was prepared using Dragon voice recognition software and may include unintentional dictation errors. ?  ?Naaman Plummer, MD ?12/26/21 2315 ? ?

## 2021-12-26 NOTE — ED Notes (Signed)
Pt to MRI

## 2021-12-26 NOTE — H&P (Signed)
?History and Physical  ? ? ?PatientSkyelyn [name] EXH:[id number] DOB: March 07, 1951 ?DOA: 12/26/2021 ?DOS: the patient was seen and examined on 12/27/2021 ?PCP: [facility] [place]  ?Patient coming from: Home ? ?Chief Complaint:  ?Chief Complaint  ?Patient presents with  ? Numbness  ? ?HPI: Deanna [name] is a 71 y.o. female with medical history significant of hypertension, CAD with recent stents presenting with right-sided weakness, worse since yesterday evening.  Daughter states that she thinks that after having the cath in the right arm she started having weakness in the right arm discussed with her that usually cardiac catheterization does not cause weakness she also has weakness in her right leg.  Patient is otherwise unable to tell me any other details but denies any complaints and does not report any headaches blurred vision speech or chest pain/ palpitation / abd pain fevers chills or any other complaints.  ? ?In the emergency room today patient hemoglobin of 10.2, otherwise normal CBC, CMP shows potassium of 3.2 glucose 117 creatinine of 3.84 with a GFR of 12, troponin of 22 with repeat  of 24, patient's BNP is 3.0, CBC shows a hemoglobin of 10.0 and otherwise normal white count and platelet count. ? ?Review of Systems: Review of Systems  ?Neurological:  Positive for weakness.  ?All other systems reviewed and are negative. ? ?Past Medical History:  ?Diagnosis Date  ? Coronary artery disease   ? Hypertension   ? ?Past Surgical History:  ?Procedure Laterality Date  ? CARDIAC CATHETERIZATION Left 03/01/2016  ? Procedure: Left Heart Cath and Coronary Angiography;  Surgeon: Teodoro Spray, MD;  Location: [facility];  Service: Cardiovascular;  Laterality: Left;  ? ?Social History:  reports that she has never smoked. She does not have any smokeless tobacco history on file. She reports that she does not drink alcohol and does not use drugs. ? ?Allergies  ?Allergen Reactions  ? Penicillins  Rash  ? ? ?No family history on file. ? ?Prior to Admission medications   ?Medication Sig Start Date End Date Taking? Authorizing Provider  ?aspirin EC 81 MG tablet Take 81 mg by mouth daily.    [provider]  ?atorvastatin (LIPITOR) 40 MG tablet Take 40 mg by mouth daily.    [provider]  ?carbamide peroxide (DEBROX) 6.5 % OTIC solution Place 5 drops into the right ear 2 (two) times daily. 09/23/21 09/23/22  Cuthriell, Charline Bills, PA-C  ?fluticasone (FLONASE) 50 MCG/ACT nasal spray Place 1 spray into both nostrils 2 (two) times daily. 09/23/21   Cuthriell, Charline Bills, PA-C  ?gabapentin (NEURONTIN) 100 MG capsule Take 300 mg by mouth.    [provider]  ?insulin aspart protamine- aspart (NOVOLOG MIX 70/30) (70-30) 100 UNIT/ML injection Inject into the skin.    [provider]  ?insulin glargine (LANTUS) 100 UNIT/ML injection Inject 65 Units into the skin at bedtime.    [provider]  ?lisinopril-hydrochlorothiazide (PRINZIDE,ZESTORETIC) 20-12.5 MG tablet Take 1 tablet by mouth daily.    [provider]  ?metFORMIN (GLUCOPHAGE) 500 MG tablet Take 500 mg by mouth 2 (two) times daily with a meal.    [provider]  ?metoprolol succinate (TOPROL-XL) 100 MG 24 hr tablet Take 100 mg by mouth daily. Take with or immediately following a meal.    [provider]  ? ? ?Physical Exam: ?Vitals:  ? 12/26/21 2310 12/26/21 2330 12/27/21 0000 12/27/21 0030  ?BP: (!) 169/85 (!) 168/70 (!) 151/72 (!) 158/73  ?  Pulse: 90 89 91 91  ?Resp: '12 14 20 '$ (!) 24  ?Temp:      ?SpO2: 100% 100% 100% 100%  ?Physical Exam ?Vitals and nursing note reviewed.  ?Constitutional:   ?   General: She is not in acute distress. ?   Appearance: Normal appearance. She is not ill-appearing, toxic-appearing or diaphoretic.  ?HENT:  ?   Head: Normocephalic and atraumatic.  ?   Right Ear: Hearing and external ear normal.  ?   Left Ear: Hearing and external ear normal.  ?   Nose: Nose  normal. No nasal deformity.  ?   Mouth/Throat:  ?   Lips: Pink.  ?   Mouth: Mucous membranes are moist.  ?   Tongue: No lesions. Tongue does not deviate from midline.  ?   Pharynx: Oropharynx is clear.  ?Eyes:  ?   Extraocular Movements: Extraocular movements intact.  ?   Pupils: Pupils are equal, round, and reactive to light.  ?Neck:  ?   Vascular: No carotid bruit.  ?Cardiovascular:  ?   Rate and Rhythm: Normal rate and regular rhythm.  ?   Pulses: Normal pulses.  ?   Heart sounds: Normal heart sounds.  ?Pulmonary:  ?   Effort: Pulmonary effort is normal.  ?   Breath sounds: Normal breath sounds.  ?Abdominal:  ?   General: Bowel sounds are normal. There is no distension.  ?   Palpations: Abdomen is soft. There is no mass.  ?   Tenderness: There is no abdominal tenderness. There is no guarding.  ?   Hernia: No hernia is present.  ?Musculoskeletal:  ?   Right lower leg: No edema.  ?   Left lower leg: No edema.  ?Skin: ?   General: Skin is warm.  ?Neurological:  ?   Mental Status: She is alert and oriented to person, place, and time.  ?   Cranial Nerves: Cranial nerves 2-12 are intact. No cranial nerve deficit, dysarthria or facial asymmetry.  ?   Motor: Weakness present.  ?   Deep Tendon Reflexes:  ?   Reflex Scores: ?     Bicep reflexes are 1+ on the right side and 1+ on the left side. ?     Patellar reflexes are 1+ on the right side and 1+ on the left side. ?   Comments: Weakness on right side and unable to move the [place] and RLE. ?  ?Psychiatric:     ?   Attention and Perception: Attention normal.     ?   Mood and Affect: Mood normal.     ?   Speech: Speech normal.     ?   Behavior: Behavior normal.     ?   Cognition and Memory: Cognition normal.  ? ? ?Data Reviewed: ?Results for orders placed or performed during the hospital encounter of 12/26/21 (from the past 24 hour(s))  ?CBC     Status: Abnormal  ? Collection Time: 12/26/21  4:21 PM  ?Result Value Ref Range  ? WBC 9.6 4.0 - 10.5 K/uL  ? RBC 3.85 (L) 3.87 -  5.11 MIL/uL  ? Hemoglobin 10.2 (L) 12.0 - 15.0 g/dL  ? HCT 32.8 (L) 36.0 - 46.0 %  ? MCV 85.2 80.0 - 100.0 fL  ? MCH 26.5 26.0 - 34.0 pg  ? MCHC 31.1 30.0 - 36.0 g/dL  ? RDW 14.6 11.5 - 15.5 %  ? Platelets 234 150 - 400 K/uL  ? nRBC 0.0 0.0 -  0.2 %  ?Differential     Status: None  ? Collection Time: 12/26/21  4:21 PM  ?Result Value Ref Range  ? Neutrophils Relative % 71 %  ? Neutro Abs 6.9 1.7 - 7.7 K/uL  ? Lymphocytes Relative 20 %  ? Lymphs Abs 1.9 0.7 - 4.0 K/uL  ? Monocytes Relative 7 %  ? Monocytes Absolute 0.6 0.1 - 1.0 K/uL  ? Eosinophils Relative 1 %  ? Eosinophils Absolute 0.1 0.0 - 0.5 K/uL  ? Basophils Relative 0 %  ? Basophils Absolute 0.0 0.0 - 0.1 K/uL  ? Immature Granulocytes 1 %  ? Abs Immature Granulocytes 0.05 0.00 - 0.07 K/uL  ?Comprehensive metabolic panel     Status: Abnormal  ? Collection Time: 12/26/21  4:21 PM  ?Result Value Ref Range  ? Sodium 136 135 - 145 mmol/L  ? Potassium 3.2 (L) 3.5 - 5.1 mmol/L  ? Chloride 104 98 - 111 mmol/L  ? CO2 22 22 - 32 mmol/L  ? Glucose, Bld 117 (H) 70 - 99 mg/dL  ? BUN 47 (H) 8 - 23 mg/dL  ? Creatinine, Ser 3.84 (H) 0.44 - 1.00 mg/dL  ? Calcium 7.4 (L) 8.9 - 10.3 mg/dL  ? Total Protein 7.6 6.5 - 8.1 g/dL  ? Albumin 3.2 (L) 3.5 - 5.0 g/dL  ? AST 13 (L) 15 - 41 U/L  ? ALT 8 0 - 44 U/L  ? Alkaline Phosphatase 116 38 - 126 U/L  ? Total Bilirubin 0.4 0.3 - 1.2 mg/dL  ? GFR, Estimated 12 (L) >60 mL/min  ? Anion gap 10 5 - 15  ?Brain natriuretic peptide     Status: Abnormal  ? Collection Time: 12/26/21  4:21 PM  ?Result Value Ref Range  ? B Natriuretic Peptide 203.0 (H) 0.0 - 100.0 pg/mL  ?Urinalysis, Routine w reflex microscopic     Status: Abnormal  ? Collection Time: 12/26/21  4:21 PM  ?Result Value Ref Range  ? Color, Urine YELLOW (A) YELLOW  ? APPearance HAZY (A) CLEAR  ? Specific Gravity, Urine 1.011 1.005 - 1.030  ? pH 5.0 5.0 - 8.0  ? Glucose, UA 150 (A) NEGATIVE mg/dL  ? Hgb urine dipstick SMALL (A) NEGATIVE  ? Bilirubin Urine NEGATIVE NEGATIVE  ? Ketones,  ur NEGATIVE NEGATIVE mg/dL  ? Protein, ur 100 (A) NEGATIVE mg/dL  ? Nitrite NEGATIVE NEGATIVE  ? Leukocytes,Ua TRACE (A) NEGATIVE  ? RBC / HPF 0-5 0 - 5 RBC/hpf  ? WBC, UA 0-5 0 - 5 WBC/hpf  ? Bacteria, UA

## 2021-12-26 NOTE — ED Notes (Addendum)
Teleneurologist at the bedside evaluating patient ?

## 2021-12-26 NOTE — ED Notes (Signed)
Called pt's grand-daughter, [name], and provided an update. States she is on the way here to see pt ?

## 2021-12-27 ENCOUNTER — Observation Stay: Payer: Medicare HMO

## 2021-12-27 ENCOUNTER — Encounter: Payer: Self-pay | Admitting: Internal Medicine

## 2021-12-27 DIAGNOSIS — D649 Anemia, unspecified: Secondary | ICD-10-CM | POA: Diagnosis present

## 2021-12-27 DIAGNOSIS — I639 Cerebral infarction, unspecified: Secondary | ICD-10-CM | POA: Diagnosis not present

## 2021-12-27 DIAGNOSIS — E876 Hypokalemia: Secondary | ICD-10-CM | POA: Diagnosis present

## 2021-12-27 DIAGNOSIS — J181 Lobar pneumonia, unspecified organism: Secondary | ICD-10-CM

## 2021-12-27 DIAGNOSIS — R531 Weakness: Secondary | ICD-10-CM

## 2021-12-27 DIAGNOSIS — N179 Acute kidney failure, unspecified: Secondary | ICD-10-CM | POA: Diagnosis not present

## 2021-12-27 DIAGNOSIS — N189 Chronic kidney disease, unspecified: Secondary | ICD-10-CM

## 2021-12-27 DIAGNOSIS — E119 Type 2 diabetes mellitus without complications: Secondary | ICD-10-CM

## 2021-12-27 LAB — CBC
HCT: 32.7 % — ABNORMAL LOW (ref 36.0–46.0)
Hemoglobin: 10.3 g/dL — ABNORMAL LOW (ref 12.0–15.0)
MCH: 26.8 pg (ref 26.0–34.0)
MCHC: 31.5 g/dL (ref 30.0–36.0)
MCV: 84.9 fL (ref 80.0–100.0)
Platelets: 231 10*3/uL (ref 150–400)
RBC: 3.85 MIL/uL — ABNORMAL LOW (ref 3.87–5.11)
RDW: 14.6 % (ref 11.5–15.5)
WBC: 12.3 10*3/uL — ABNORMAL HIGH (ref 4.0–10.5)
nRBC: 0 % (ref 0.0–0.2)

## 2021-12-27 LAB — URINE DRUG SCREEN, QUALITATIVE (ARMC ONLY)
Amphetamines, Ur Screen: NOT DETECTED
Barbiturates, Ur Screen: NOT DETECTED
Benzodiazepine, Ur Scrn: NOT DETECTED
Cannabinoid 50 Ng, Ur ~~LOC~~: NOT DETECTED
Cocaine Metabolite,Ur ~~LOC~~: NOT DETECTED
MDMA (Ecstasy)Ur Screen: NOT DETECTED
Methadone Scn, Ur: NOT DETECTED
Opiate, Ur Screen: NOT DETECTED
Phencyclidine (PCP) Ur S: NOT DETECTED
Tricyclic, Ur Screen: NOT DETECTED

## 2021-12-27 LAB — HIV ANTIBODY (ROUTINE TESTING W REFLEX): HIV Screen 4th Generation wRfx: NONREACTIVE

## 2021-12-27 LAB — GLUCOSE, CAPILLARY
Glucose-Capillary: 152 mg/dL — ABNORMAL HIGH (ref 70–99)
Glucose-Capillary: 124 mg/dL — ABNORMAL HIGH (ref 70–99)
Glucose-Capillary: 146 mg/dL — ABNORMAL HIGH (ref 70–99)

## 2021-12-27 LAB — LIPID PANEL
Cholesterol: 182 mg/dL (ref 0–200)
HDL: 80 mg/dL (ref >40)
LDL Cholesterol: 87 mg/dL (ref 0–99)
Total CHOL/HDL Ratio: 2.3 RATIO
Triglycerides: 77 mg/dL (ref <150)
VLDL: 15 mg/dL (ref 0–40)

## 2021-12-27 LAB — RESP PANEL BY RT-PCR (FLU A&B, COVID) ARPGX2
Influenza A by PCR: NEGATIVE
Influenza B by PCR: NEGATIVE
SARS Coronavirus 2 by RT PCR: NEGATIVE

## 2021-12-27 LAB — CREATININE, SERUM
Creatinine, Ser: 3.49 mg/dL — ABNORMAL HIGH (ref 0.44–1.00)
GFR, Estimated: 14 mL/min — ABNORMAL LOW (ref >60)

## 2021-12-27 MED ORDER — ACETAMINOPHEN 325 MG PO TABS
650.0000 mg | ORAL_TABLET | ORAL | Status: DC | PRN
  Administered 2021-12-27 – 2022-01-06 (×11): 650 mg via ORAL
  Filled 2021-12-27 (×11): qty 2

## 2021-12-27 MED ORDER — ENSURE ENLIVE PO LIQD
237.0000 mL | Freq: Two times a day (BID) | ORAL | Status: DC
  Administered 2021-12-28 – 2022-01-01 (×9): 237 mL via ORAL

## 2021-12-27 MED ORDER — AZITHROMYCIN 500 MG PO TABS
250.0000 mg | ORAL_TABLET | Freq: Every day | ORAL | Status: AC
  Administered 2021-12-28 – 2021-12-31 (×4): 250 mg via ORAL
  Filled 2021-12-27 (×4): qty 1

## 2021-12-27 MED ORDER — ACETAMINOPHEN 650 MG RE SUPP: 650.0000 mg | RECTAL | Status: DC | PRN

## 2021-12-27 MED ORDER — AZITHROMYCIN 500 MG PO TABS
500.0000 mg | ORAL_TABLET | Freq: Every day | ORAL | Status: AC
  Administered 2021-12-27: 500 mg via ORAL
  Filled 2021-12-27: qty 1

## 2021-12-27 MED ORDER — STROKE: EARLY STAGES OF RECOVERY BOOK: Freq: Once | Status: DC

## 2021-12-27 MED ORDER — INSULIN ASPART 100 UNIT/ML IJ SOLN
0.0000 [IU] | Freq: Three times a day (TID) | INTRAMUSCULAR | Status: DC
  Administered 2021-12-31: 5 [IU] via SUBCUTANEOUS
  Administered 2021-12-31: 3 [IU] via SUBCUTANEOUS
  Administered 2021-12-31: 2 [IU] via SUBCUTANEOUS
  Administered 2022-01-01: 5 [IU] via SUBCUTANEOUS
  Filled 2021-12-27 (×6): qty 1

## 2021-12-27 MED ORDER — ATORVASTATIN CALCIUM 20 MG PO TABS
40.0000 mg | ORAL_TABLET | Freq: Every evening | ORAL | Status: DC
  Administered 2021-12-27: 40 mg via ORAL
  Filled 2021-12-27: qty 2

## 2021-12-27 MED ORDER — ASPIRIN 300 MG RE SUPP
300.0000 mg | Freq: Every day | RECTAL | Status: DC
  Filled 2021-12-27: qty 1

## 2021-12-27 MED ORDER — ACETAMINOPHEN 160 MG/5ML PO SOLN
650.0000 mg | ORAL | Status: DC | PRN
  Filled 2021-12-27: qty 20.3

## 2021-12-27 MED ORDER — INSULIN ASPART 100 UNIT/ML IJ SOLN
0.0000 [IU] | Freq: Every day | INTRAMUSCULAR | Status: DC
  Administered 2021-12-30: 2 [IU] via SUBCUTANEOUS
  Administered 2021-12-31: 3 [IU] via SUBCUTANEOUS
  Administered 2022-01-02 (×2): 5 [IU] via SUBCUTANEOUS
  Administered 2022-01-03: 4 [IU] via SUBCUTANEOUS
  Filled 2021-12-27 (×5): qty 1

## 2021-12-27 MED ORDER — SODIUM CHLORIDE 0.9 % IV SOLN
2.0000 g | INTRAVENOUS | Status: AC
  Administered 2021-12-27 – 2021-12-31 (×5): 2 g via INTRAVENOUS
  Filled 2021-12-27 (×5): qty 2

## 2021-12-27 MED ORDER — CLOPIDOGREL BISULFATE 75 MG PO TABS
75.0000 mg | ORAL_TABLET | Freq: Every day | ORAL | Status: DC
  Administered 2021-12-27 – 2021-12-28 (×2): 75 mg via ORAL
  Filled 2021-12-27 (×2): qty 1

## 2021-12-27 MED ORDER — ASPIRIN 325 MG PO TABS
325.0000 mg | ORAL_TABLET | Freq: Every day | ORAL | Status: DC
  Administered 2021-12-27: 325 mg via ORAL
  Filled 2021-12-27: qty 1

## 2021-12-27 MED ORDER — METOPROLOL SUCCINATE ER 50 MG PO TB24
25.0000 mg | ORAL_TABLET | Freq: Every day | ORAL | Status: DC
  Administered 2021-12-27 – 2022-01-07 (×11): 25 mg via ORAL
  Filled 2021-12-27 (×12): qty 1

## 2021-12-27 MED ORDER — ASPIRIN EC 81 MG PO TBEC
81.0000 mg | DELAYED_RELEASE_TABLET | Freq: Every day | ORAL | Status: DC
Start: 2021-12-28 — End: 2022-01-13
  Administered 2021-12-28 – 2022-01-12 (×16): 81 mg via ORAL
  Filled 2021-12-27 (×16): qty 1

## 2021-12-27 MED ORDER — SODIUM CHLORIDE 0.9 % IV SOLN: INTRAVENOUS | Status: DC

## 2021-12-27 MED ORDER — ADULT MULTIVITAMIN W/MINERALS CH
1.0000 | ORAL_TABLET | Freq: Every day | ORAL | Status: DC
  Administered 2021-12-27 – 2022-01-04 (×8): 1 via ORAL
  Filled 2021-12-27 (×8): qty 1

## 2021-12-27 MED ORDER — HEPARIN SODIUM (PORCINE) 5000 UNIT/ML IJ SOLN
5000.0000 [IU] | Freq: Three times a day (TID) | INTRAMUSCULAR | Status: DC
  Administered 2021-12-27 – 2022-01-07 (×31): 5000 [IU] via SUBCUTANEOUS
  Filled 2021-12-27 (×33): qty 1

## 2021-12-27 NOTE — Evaluation (Signed)
Physical Therapy Evaluation Patient Details Name: Deanna [name] MRN: [id number] DOB: 22-Jan-1951 Today's Date: 12/27/2021  History of Present Illness  71 y.o. female with medical history significant of hypertension, CAD with recent stents presenting with right-sided weakness, worse since yesterday evening. 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 No  hemorrhage or mass effect.  Clinical Impression  Pt seen by OT earlier this date and she had issues with dizziness while trying to move/standing.  No symptoms during PT exam, took BPs multiple times and multiple postures with no overt or symptomatic changes and consistent readings in the 140-150s/70-90 range.  Pt reports she is unable to do more than a few steps at a time with the walker and someone close in her apartment, reports she has fallen multiple times in the bathroom (even with help) and does not hae grab bars, etc in her bathroom, recommending bed side commode with rails for safety.  Pt was able to ambulate more than her self reported baseline, was highly fatigued with the effort after the 15 ft she managed today.     Recommendations for follow up therapy are one component of a multi-disciplinary discharge planning process, led by the attending physician.  Recommendations may be updated based on patient status, additional functional criteria and insurance authorization.  Follow Up Recommendations Home health PT    Assistance Recommended at Discharge Intermittent Supervision/Assistance  Patient can return home with the following  Help with stairs or ramp for entrance;Assist for transportation;Assistance with cooking/housework;A little help with walking and/or transfers;A little help with bathing/dressing/bathroom    Equipment Recommendations BSC/3in1  Recommendations for Other Services       Functional Status Assessment Patient has  had a recent decline in their functional status and demonstrates the ability to make significant improvements in function in a reasonable and predictable amount of time.     Precautions / Restrictions Precautions Precautions: Fall Restrictions Weight Bearing Restrictions: No      Mobility  Bed Mobility Overal bed mobility: Needs Assistance Bed Mobility: Supine to Sit     Supine to sit: Supervision     General bed mobility comments: Pt able to get herself to sitting w/o assist, or hesitation.  No lighthheadedness, etc    Transfers Overall transfer level: Modified independent Equipment used: Rolling walker (2 wheels)               General transfer comment: heavy forward lean and use of UEs, but able to rise w/o direct assist and only minimal additional cuing for set up and encouragement    Ambulation/Gait Ambulation/Gait assistance: Min guard Gait Distance (Feet): 15 Feet Assistive device: Rolling walker (2 wheels)         General Gait Details: Pt with some R LE WBing hesitancy that she reports as near baseline/not new.  She did not show a lot of confidence but did not have any LOBs, dizziness or buckling and was able to walk at least as far as her normal self reported in-home typical limit.  Stairs            Wheelchair Mobility    Modified Rankin (Stroke Patients Only)       Balance Overall balance assessment: Needs assistance Sitting-balance support: No upper extremity supported, Feet supported Sitting balance-[name]: Fair Sitting balance - Comments: no dizziness reported or overt symptoms in seated     Standing balance-[name]: Fair Standing balance comment: reliant  on walker, poor standing tolerance                             Pertinent Vitals/Pain Pain Assessment Pain Assessment: Faces Faces Pain Scale: Hurts a little bit Pain Location: chest pain when she coughs    Home Living Family/patient expects to be  discharged to:: Private residence Living Arrangements: Other relatives (grandchildren) Available Help at Discharge: Family;Available 24 hours/day Type of Home: Apartment Home Access: Stairs to enter Entrance Stairs-Rails: Right Entrance Stairs-Number of Steps: 14   Home Layout: One level Home Equipment: Building control surveyor (4 wheels)      Prior Function Prior Level of Function : Needs assist       Physical Assist : Mobility (physical);ADLs (physical) Mobility (physical): Transfers;Gait;Stairs ADLs (physical): Bathing;Dressing;Toileting;IADLs Mobility Comments: Pt reports that she always has someone with her but does not always need phyiscal assist, reports functional mobility of short distances (~24ft) with RW, and heavy assist stair navigation. Pt endorses countless falls (d/t knee buckling and "passing out"). Pt reports that her caregivers have fallen when providing assistance during mobility ADLs Comments: At baseline, pt is independent with feeding, seated grooming, seated UB dressing, and seated peri-care. Family assists with LB dressing, bathing, and toilet transfers     Hand Dominance   Dominant Hand: Right    Extremity/Trunk Assessment   Upper Extremity Assessment Upper Extremity Assessment: Generalized weakness;RUE deficits/detail (strength in b/l UE is symmetrical. Sensation RUE<LUE. Coordination WFL) RUE Deficits / Details: functional t/o, appears to have = strength, AROM and coordination in b/l UEs RUE Sensation: decreased light touch RUE Coordination: WNL    Lower Extremity Assessment Lower Extremity Assessment: Overall WFL for tasks assessed (mild hesitancy with R LE movement vs L, but functional t/o)       Communication   Communication: No difficulties  Cognition Arousal/Alertness: Awake/alert Behavior During Therapy: WFL for tasks assessed/performed Overall Cognitive Status: Within Functional Limits for tasks assessed                                           General Comments General comments (skin integrity, edema, etc.): While sitting EOB, pt reporting dizziness and visual changes. BP supine: 136/83, BP sitting EOB 142/78    Exercises     Assessment/Plan    PT Assessment Patient needs continued PT services  PT Problem List Decreased strength;Decreased range of motion;Decreased activity tolerance;Decreased balance;Decreased mobility;Decreased knowledge of use of DME;Decreased safety awareness;Cardiopulmonary status limiting activity       PT Treatment Interventions DME instruction;Gait training;Stair training;Functional mobility training;Therapeutic activities;Therapeutic exercise;Balance training;Neuromuscular re-education;Patient/family education    PT Goals (Current goals can be found in the Care Plan section)  Acute Rehab PT Goals Patient Stated Goal: Go home PT Goal Formulation: With patient Time For Goal Achievement: 01/10/22 Potential to Achieve Goals: Good    Frequency Min 2X/week     Co-evaluation               AM-PAC PT "6 Clicks" Mobility  Outcome Measure Help needed turning from your back to your side while in a flat bed without using bedrails?: A Little Help needed moving from lying on your back to sitting on the side of a flat bed without using bedrails?: A Little Help needed moving to and from a bed to a chair (including a wheelchair)?: A Little Help needed  standing up from a chair using your arms (e.g., wheelchair or bedside chair)?: A Little Help needed to walk in hospital room?: A Lot Help needed climbing 3-5 steps with a railing? : A Lot 6 Click Score: 16    End of Session Equipment Utilized During Treatment: Gait belt Activity Tolerance: Patient limited by fatigue Patient left: with chair alarm set;with call bell/phone within reach Nurse Communication: Mobility status PT Visit Diagnosis: Muscle weakness (generalized) (M62.81);Difficulty in walking, not elsewhere classified  (R26.2);History of falling (Z91.81)    Time: 6962-9528 PT Time Calculation (min) (ACUTE ONLY): 25 min   Charges:   PT Evaluation $PT Eval Low Complexity: 1 Low PT Treatments $Gait Training: 8-22 mins        Malachi Pro, DPT 12/27/2021, 1:34 PM

## 2021-12-27 NOTE — Care Management (Signed)
RNCM attempted to see patient, patient requested to be left alone so she can sleep.  RNCM will return for assessment. ?

## 2021-12-27 NOTE — Assessment & Plan Note (Addendum)
Continue aspirin Plavix and Lipitor.  LDL 87 ?

## 2021-12-27 NOTE — Progress Notes (Signed)
Initial Nutrition Assessment ? ?DOCUMENTATION CODES:  ? ?Not applicable ? ?INTERVENTION:  ?- Liberalize diet from a heart healthy to a regular diet to provide widest variety of menu options to enhance nutritional adequacy as pt continues with poor PO intake ? ?- Ensure Enlive po BID, each supplement provides 350 kcal and 20 grams of protein. ? ?- MVI with minerals daily ? ?NUTRITION DIAGNOSIS:  ? ?Inadequate oral intake related to poor appetite, early satiety as evidenced by per patient/family report. ? ?GOAL:  ? ?Patient will meet greater than or equal to 90% of their needs ? ?MONITOR:  ? ?PO intake, Supplement acceptance, Diet advancement, Labs, Weight trends ? ?REASON FOR ASSESSMENT:  ? ?Consult ?Assessment of nutrition requirement/status ? ?ASSESSMENT:  ? ?Pt admitted from home with R sided weakness, admitted to r/o stroke. PMH significant for HTN and CAD with recent stents. ? ?Pt expressed frustrations as she has been trying to rest and has not been able to. Pt reports poor appetite and PO intake for the past 4-5 months since starting a new medication. She endorses early satiety and has only been taking a few bites of food as able. She denies nausea, vomiting, and difficulty with chewing and swallowing.  ? ?Pt states that prior to these past 4-5 months, her usual weight was 180 lbs and she is now 148 lbs. Unfortunately, unable to review this weight loss per review of chart as there is limited documentation of recent weight history. Current admit weight noted to be 149 lbs. Will continue to monitor throughout admission. ? ?Medications: SSI, IV abx ? ?Labs: potassium 3.2, BUN 47, Cr 3.49, corrected calcium 8.04, AST 13, CBG 152 ? ?NUTRITION - FOCUSED PHYSICAL EXAM: ?Deferred to follow up. Pt declined. ? ?Diet Order:   ?Diet Order   ? ?       ?  Diet Heart Room service appropriate? Yes; Fluid consistency: Thin  Diet effective now       ?  ? ?  ?  ? ?  ? ? ?EDUCATION NEEDS:  ? ?No education needs have been  identified at this time ? ?Skin:  Skin Assessment: Reviewed RN Assessment ? ?Last BM:  4/2 ? ?Height:  ? ?Ht Readings from Last 1 Encounters:  ?12/27/21 '6\' 1"'$  (1.854 m)  ? ? ?Weight:  ? ?Wt Readings from Last 1 Encounters:  ?12/27/21 67.8 kg  ? ?BMI:  Body mass index is 19.71 kg/m?. ? ?Estimated Nutritional Needs:  ? ?Kcal:  1700-1900 ? ?Protein:  85-100g ? ?Fluid:  >/=1.7L ? ?Clayborne Dana, RDN, LDN ?Clinical Nutrition ?

## 2021-12-27 NOTE — Evaluation (Addendum)
Occupational Therapy Evaluation ?Patient Details ?Name: Deanna [name] ?MRN: [id number] ?DOB: 01-28-1951 ?Today's Date: 12/27/2021 ? ? ?History of Present Illness 71 y.o. female with medical history significant of hypertension, CAD with recent stents presenting with right-sided weakness, worse since yesterday evening. 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 No  hemorrhage or mass effect.  ? ?Clinical Impression ?  ?Pt seen for OT evaluation this date. Upon arrival to room, pt awake and sitting upright in bed. Pt was observed to have R lateral lean while sitting in bed. Pt A&Ox4 and agreeable to OT eval/tx. Pt reports that at baseline, she is independent with feeding, seated grooming, seated UB dressing, and seated peri-care. Pt reports that her grandchildren assist with LB dressing, bathing, and all mobility (recieves +2 assist for ADL transfers). Pt endorses that she has had countless falls over the past 12 months (d/t knee buckling and "passing out"). Pt reports that her caregivers have fallen when providing assistance during mobility. ? ?Pt performed bed mobility requiring only SUPERVISION d/t pt reporting dizziness and visual changes ("seeing yellow dots") when sitting EOB. Pt returned to supine and BP obtained (136/83). Following second supine>sit transfer, pt continuing to c/o of dizziness and visual changes (BP 142/78). Pt unable to engage in seated UB ADLs while seated EOB d/t decreased sitting balance and pt reporting worsening of symptoms and initiating return to supine. RN & MD informed and present during session. While supine, pt endorsed decreased RUE sensation, however coordination WFL and strength 3/5 (symmetrical to LUE). Pt able to wash face with washcloth using RUE and requiring only SET-UP assist. Pt would benefit from additional skilled OT services to maximize return to PLOF and minimize risk  of future falls, injury, caregiver burden, and readmission. Upon discharge, recommend HHOT as pt appears to be nearing her baseline.      ?   ? ?Recommendations for follow up therapy are one component of a multi-disciplinary discharge planning process, led by the attending physician.  Recommendations may be updated based on patient status, additional functional criteria and insurance authorization.  ? ?Follow Up Recommendations ? Home health OT  ?  ?Assistance Recommended at Discharge Frequent or constant Supervision/Assistance  ?Patient can return home with the following Two people to help with walking and/or transfers;Two people to help with bathing/dressing/bathroom;Assistance with cooking/housework;Help with stairs or ramp for entrance ? ?  ?Functional Status Assessment ? Patient has had a recent decline in their functional status and demonstrates the ability to make significant improvements in function in a reasonable and predictable amount of time.  ?Equipment Recommendations ? Other (comment) (defer to next venue of care)  ?  ?   ?Precautions / Restrictions Precautions ?Precautions: Fall ?Restrictions ?Weight Bearing Restrictions: No  ? ?  ? ?Mobility Bed Mobility ?Overal bed mobility: Needs Assistance ?Bed Mobility: Supine to Sit, Sit to Supine ?  ?  ?Supine to sit: Supervision, HOB elevated ?Sit to supine: Supervision, HOB elevated ?  ?General bed mobility comments: Requires increased time/effort and use of bedrails. Requires supervision 2/2 pt reporting dizziness and visual changes when sitting EOB ?  ? ?Transfers ?  ?  ?  ?  ?  ?  ?  ?  ?  ?General transfer comment: pt unable to attempt this date 2/2 pt reporting dizziness and visual changes when sitting EOB ?  ? ?  ?Balance Overall balance assessment: Needs assistance ?Sitting-balance support: No upper extremity  supported, Feet supported ?Sitting balance-[name]: Poor ?Sitting balance - Comments: While sitting upright in bed, pt demonstrates R  lateral lean. While sitting EOB, pt demonstrates R lateral and posterior lean with UE unsupported ?Postural control: Right lateral lean, Posterior lean ?  ?  ?  ?  ?  ?  ?  ?  ?  ?  ?  ?  ?  ?  ?   ? ?ADL either performed or assessed with clinical judgement  ? ?ADL Overall ADL's : Needs assistance/impaired ?  ?  ?Grooming: Dance movement psychotherapist;Set up;Bed level ?Grooming Details (indicate cue type and reason): With bed in chair position, pt able to use RUE to wash face with washcloth ?  ?  ?  ?  ?  ?  ?  ?  ?  ?  ?  ?  ?  ?  ?  ?General ADL Comments: Unable to complete ADLs while seated EOB 2/2 suspected vasovagal episode; RN & MD informed and present during session  ? ? ? ?Vision Ability to See in Adequate Light: 0 Adequate ?Patient Visual Report: Other (comment) (reports seeing "yellow dots" when sitting upright. No visual deficits when in bed) ?   ?   ?   ?   ? ?Pertinent Vitals/Pain Pain Assessment ?Pain Assessment: Faces ?Faces Pain Scale: Hurts a little bit ?Pain Location: chest pain ?Pain Descriptors / Indicators: Aching ?Pain Intervention(s): Monitored during session, Other (comment) (RN & MD informed)  ? ? ? ?Hand Dominance Right ?  ?Extremity/Trunk Assessment Upper Extremity Assessment ?Upper Extremity Assessment: Generalized weakness;RUE deficits/detail (strength in b/l UE is symmetrical. Sensation RUE<LUE. Coordination WFL) ?RUE Deficits / Details: grossly at least 3/5 in all movements. Pt endorses decreased light touch. ?RUE Sensation: decreased light touch ?RUE Coordination: WNL ?  ?Lower Extremity Assessment ?Lower Extremity Assessment: Generalized weakness ?  ?  ?  ?Communication Communication ?Communication: No difficulties ?  ?Cognition Arousal/Alertness: Awake/alert ?Behavior During Therapy: [facility] for tasks assessed/performed ?Overall Cognitive Status: Within Functional Limits for tasks assessed ?  ?  ?  ?  ?  ?  ?  ?  ?  ?  ?  ?  ?  ?  ?  ?  ?General Comments: A&Ox4 ?  ?  ?General Comments  While sitting  EOB, pt reporting dizziness and visual changes. BP supine: 136/83, BP sitting EOB 142/78 ? ?  ?   ?   ? ? ?Home Living Family/patient expects to be discharged to:: Private residence ?Living Arrangements: Other (Comment) (grandson and granddaughter) ?Available Help at Discharge: Family;Available 24 hours/day ?Type of Home: Apartment ?Home Access: Stairs to enter ?Entrance Stairs-Number of Steps: 14 ?Entrance Stairs-Rails: Right ?Home Layout: One level ?  ?  ?Bathroom Shower/Tub: Tub/shower unit ?  ?Bathroom Toilet: Standard ?  ?  ?Home Equipment: Associate Professor (4 wheels) ?  ?  ?  ? ?  ?Prior Functioning/Environment Prior Level of Function : Needs assist ?  ?  ?  ?Physical Assist : Mobility (physical);ADLs (physical) ?Mobility (physical): Transfers;Gait;Stairs ?ADLs (physical): Bathing;Dressing;Toileting;IADLs ?Mobility Comments: At baseline, pt reports that she requires +2 assist for sit>stand transfers, functional mobility of short distances (no more than ~73f) with RW, and stair navigation. Pt endorses countless falls (d/t knee buckling and "passing out"). Pt reports that her caregivers have fallen when providing assistance during mobility ?ADLs Comments: At baseline, pt is independent with feeding, seated grooming, seated UB dressing, and seated peri-care. Family assists with LB dressing, bathing, and toilet transfers ?  ? ?  ?  ?  OT Problem List: Decreased strength;Impaired balance (sitting and/or standing);Decreased safety awareness;Impaired sensation ?  ?   ?OT Treatment/Interventions: Self-care/ADL training;Therapeutic exercise;Neuromuscular education;DME and/or AE instruction;Therapeutic activities;Patient/family education;Balance training  ?  ?OT Goals(Current goals can be found in the care plan section) Acute Rehab OT Goals ?Patient Stated Goal: to return home ?OT Goal Formulation: With patient ?Time For Goal Achievement: 01/10/22 ?Potential to Achieve Goals: Fair ?ADL Goals ?Pt Will Perform  Grooming: with modified independence;sitting ?Pt Will Perform Upper Body Dressing: with modified independence;sitting ?Pt Will Perform Lower Body Dressing: with mod assist;sitting/lateral leans  ?OT Frequency: Mi

## 2021-12-27 NOTE — Progress Notes (Signed)
SLP Cancellation Note ? ?Patient Details ?Name: Deanna [name] ?MRN: [id number] ?DOB: December 11, 1950 ? ? ?Cancelled treatment:       Reason Eval/Treat Not Completed: SLP screened, no needs identified, will sign off (chart reivewed; consulted NSG then met w/ pt in room. Breakfast arrived.) ?Pt denied any difficulty swallowing and is currently on a regular diet; tolerates swallowing pills w/ water per NSG. Pt fed self some of her breakfast meal during this visit then requested to go to the bathroom. Pt conversed in conversation w/out expressive/receptive deficits noted; pt denied any speech-language deficits. Speech clear. ?No further skilled ST services indicated as pt appears at her baseline. Pt agreed. NSG to reconsult if any change in status while admitted.   ? ? ? ? ? ? ?Orinda Kenner, MS, CCC-SLP ?Speech Language Pathologist ?Rehab Services; [place] ?[phone] (ascom) ?Deanna [name] ?12/27/2021, 9:56 AM ?

## 2021-12-27 NOTE — Assessment & Plan Note (Addendum)
Hemoglobin dropped to 7.8.  No obvious bleeding.  Neupogen per nephrology.  Close monitoring and transfuse as needed ? ?

## 2021-12-27 NOTE — Assessment & Plan Note (Addendum)
Acute kidney injury on chronic kidney disease stage IV.  Baseline creatinine around 3.19 as per care everywhere. ?Lab Results  ?Component Value Date  ? CREATININE 2.95 (H) 01/08/2022  ? CREATININE 2.34 (H) 01/07/2022  ? CREATININE 2.11 (H) 01/07/2022  ?Dialysis per nephrology ?

## 2021-12-27 NOTE — Progress Notes (Signed)
?Progress Note ? ? ?PatientSwetha [name] ZJQ:[id number] DOB: [birth year] DOA: 12/26/2021     0 ?DOS: the patient was seen and examined on 12/27/2021 ?  ?Brief hospital course: ?71 year old female with past medical history of CAD, hypertension and chronic kidney disease stage IV.  She presents with right-sided numbness and weakness. ? ?MRI of the brain shows a punctate focus of reduced diffusion within the right superior pericallosal white matter.  Case discussed with neurology and this likely would not be causing her right-sided weakness so MRI of the cervical and thoracic spine ordered an MRA of the brain ordered. ? ?While I was in the room the patient had a vasovagal event where she got dizzy and lightheaded and was seeing spots.  This resolved quickly.  The patient was not orthostatic before this and blood pressure was okay during the events. ? ?Assessment and Plan: ?* Right sided weakness ?Right-sided weakness not explained by MRI of the brain.  Neurology ordered MRI of the cervical spine and thoracic spine and an MRI of the brain. ? ? ?Acute CVA (cerebrovascular accident) ([place]) ?Patient started on aspirin Plavix and Lipitor.  LDL 87 ? ?Lobar pneumonia ([place]) ?Right lobar pneumonia.  Start Rocephin and Zithromax.  COVID test negative.  Respiratory panel pending.  Had fever of 100.1 earlier today. ? ?Acute kidney injury superimposed on chronic kidney disease ([place]) ?Acute kidney injury on chronic kidney disease stage IV.  Creatinine 3.84 on presentation down to 3.49 today.  Baseline creatinine around 3.19 as per care everywhere. ? ?Anemia ?Anemia unspecified.  Last hemoglobin 10.3.  Will check iron studies. ? ? ?Hypokalemia ?Potassium 3.2 on presentation.  Careful with replacement secondary to CKD stage IV. ? ?Type 2 diabetes mellitus without complications ([place]) ?Holding long-acting insulin.  Starting sliding scale insulin.  Blood sugar 152 ? ? ?HFrEF (heart failure with reduced ejection fraction) ([place]) ?Patient is  euvolemic at this point.  With acute kidney injury holding diuretics.  Repeating echo with stroke. Last EF last year 30%. ? ?Essential (primary) hypertension ?Restart Toprol low-dose at night. ? ? ? ? ?  ? ?Subjective: Patient came in with right-sided weakness and numbness hopefully.  While I was in the room with her the patient did have to lie down and she was seen spots with both eyes.  This soon passed and she felt a little bit better. ? ?Physical Exam: ?Vitals:  ? 12/27/21 0929 12/27/21 1110 12/27/21 1120 12/27/21 1300  ?BP: (!) 155/69  (!) 144/72   ?Pulse: (!) 102  (!) 101   ?Resp: 16  16   ?Temp: 100.1 ?F (37.8 ?C) 98.9 ?F (37.2 ?C) 98.5 ?F (36.9 ?C)   ?TempSrc:  Oral Oral   ?SpO2: 99%  98%   ?Weight:    67.8 kg  ?Height:    '6\' 1"'$  (1.854 m)  ? ?Physical Exam ?HENT:  ?   Head: Normocephalic.  ?   Mouth/Throat:  ?   Pharynx: No oropharyngeal exudate.  ?Eyes:  ?   General: Lids are normal.  ?   Conjunctiva/sclera: Conjunctivae normal.  ?Cardiovascular:  ?   Rate and Rhythm: Normal rate and regular rhythm.  ?   Heart sounds: Normal heart sounds, S1 normal and S2 normal.  ?Pulmonary:  ?   Breath sounds: Examination of the right-lower field reveals decreased breath sounds and rhonchi. Examination of the left-lower field reveals decreased breath sounds and rhonchi. Decreased breath sounds and rhonchi present. No wheezing or rales.  ?Abdominal:  ?   Palpations:  Abdomen is soft.  ?   Tenderness: There is no abdominal tenderness.  ?Musculoskeletal:  ?   Right lower leg: No swelling.  ?   Left lower leg: No swelling.  ?Skin: ?   General: Skin is warm.  ?   Findings: No rash.  ?Neurological:  ?   Mental Status: She is alert and oriented to person, place, and time.  ?   Comments: Right leg 4 out of 5 power left leg 4+ out of 5 power.  5 out of 5 grip strength bilaterally.  Able to lift up arms bilaterally.  ?  ?Data Reviewed: ?Chest x-ray possible right lower lobe pneumonia, last creatinine 3.49, white blood cell count  12.3, hemoglobin 10.3 ? ?Family Communication: Spoke with granddaughter earlier today ? ?Disposition: ?Status is: Observation ?Patient with right-sided weakness and stroke seen on MRI does not correlate with her right-sided weakness.  Neurology ordered an MRI of the cervical spine and thoracic spine and MRA of the brain. ? ?Planned Discharge Destination: Home with Home Health ? ? ?Author: ?Loletha Grayer, MD ?12/27/2021 4:55 PM ? ?For on call review [email or web address].  ?

## 2021-12-27 NOTE — Assessment & Plan Note (Addendum)
On cefepime.  Respiratory panel positive for parainfluenza virus ?

## 2021-12-27 NOTE — Consult Note (Signed)
Neurology Consultation ?Reason for Consult:  ?Requesting Physician: Loletha Grayer ? ?CC: Right-sided weakness ? ?History is obtained from: Patient and chart review ? ?HPI: Deanna [name] is a 71 y.o. female with past medical history significant for hypertension, hyperlipidemia, heart failure with reduced EF, type 2 diabetes, AKI on CKD, prior meralgia paresthetica of the right side, polyneuropathy, chronic dizziness. ? ?She reports that she began to have some right-sided weakness over the weekend and that this is gradually and progressively worsened.  History and examination are somewhat limited by the fact that she is very tired, uncomfortable, and somewhat irritable though she does attempt to cooperate.  She notes she is been having some fevers, that she has frequent cough/colds nearly constantly and that she has had increased urination in the past few days, but no sensory symptoms and no bowel changes.  She reports she has had some medication changes but is unclear exactly what may have changed recently.  She denies pain or weakness on the left side but notes only right-sided issues.  She does endorse frequent falls and lightheadedness with presyncope and syncope. ? ?PT/OT evaluation report patient is at her baseline and symmetric on their evaluation ?"While supine, pt endorsed decreased RUE sensation, however coordination WFL and strength 3/5 (symmetrical to LUE). " ?"Pt was able to ambulate more than her self reported baseline, was highly fatigued with the effort after the 15 ft she managed today." ? ?LKW: April 1 ?tPA given?: No, out of the window ?Premorbid modified rankin scale: 2-3 ?    2 - Slight disability. Able to look after own affairs without assistance, but unable to carry out all previous activities. ?    3 - Moderate disability. Requires some help, but able to walk unassisted. ? ? ?ROS: All other review of systems was negative except as noted in the HPI.  ? ?Past Medical History:  ?Diagnosis  Date  ? Coronary artery disease   ? Hypertension   ? ?Past Surgical History:  ?Procedure Laterality Date  ? CARDIAC CATHETERIZATION Left 03/01/2016  ? Procedure: Left Heart Cath and Coronary Angiography;  Surgeon: Teodoro Spray, MD;  Location: [facility];  Service: Cardiovascular;  Laterality: Left;  ? ?Current Outpatient Medications  ?Medication Instructions  ? aspirin EC 81 mg, Oral, Daily  ? atorvastatin (LIPITOR) 40 mg, Oral, Daily  ? carbamide peroxide (DEBROX) 6.5 % OTIC solution 5 drops, Right EAR, 2 times daily  ? fluticasone (FLONASE) 50 MCG/ACT nasal spray 1 spray, Each Nare, 2 times daily  ? gabapentin (NEURONTIN) 300 mg, Oral  ? insulin aspart protamine- aspart (NOVOLOG MIX 70/30) (70-30) 100 UNIT/ML injection Inject into the skin.  ? insulin glargine (LANTUS) 65 Units, Subcutaneous, Daily at bedtime  ? lisinopril-hydrochlorothiazide (PRINZIDE,ZESTORETIC) 20-12.5 MG tablet 1 tablet, Oral, Daily  ? metFORMIN (GLUCOPHAGE) 500 mg, 2 times daily with meals  ? metoprolol succinate (TOPROL-XL) 100 mg, Daily  ? ? ? ?Current Facility-Administered Medications:  ?   stroke: mapping our early stages of recovery book, , Does not apply, Once, Para Skeans, MD ?  0.9 %  sodium chloride infusion, , Intravenous, Continuous, Para Skeans, MD ?  acetaminophen (TYLENOL) tablet 650 mg, 650 mg, Oral, Q4H PRN, 650 mg at 12/27/21 0931 **OR** acetaminophen (TYLENOL) 160 MG/5ML solution 650 mg, 650 mg, Per Tube, Q4H PRN **OR** acetaminophen (TYLENOL) suppository 650 mg, 650 mg, Rectal, Q4H PRN, Para Skeans, MD ?  Derrill Memo ON 12/28/2021] aspirin EC tablet 81 mg, 81 mg, Oral, Daily,  Loletha Grayer, MD ?  atorvastatin (LIPITOR) tablet 40 mg, 40 mg, Oral, QPM, Wieting, Richard, MD ?  azithromycin (ZITHROMAX) tablet 500 mg, 500 mg, Oral, Daily **FOLLOWED BY** [START ON 12/28/2021] azithromycin (ZITHROMAX) tablet 250 mg, 250 mg, Oral, Daily, Wieting, Richard, MD ?  cefTRIAXone (ROCEPHIN) 2 g in sodium chloride 0.9 % 100 mL  IVPB, 2 g, Intravenous, Q24H, Wieting, Richard, MD, Last Rate: 200 mL/hr at 12/27/21 1244, 2 g at 12/27/21 1244 ?  clopidogrel (PLAVIX) tablet 75 mg, 75 mg, Oral, Daily, Wieting, Richard, MD ?  heparin injection 5,000 Units, 5,000 Units, Subcutaneous, Q8H, Para Skeans, MD, 5,000 Units at 12/27/21 0257 ?  insulin aspart (novoLOG) injection 0-5 Units, 0-5 Units, Subcutaneous, QHS, Wieting, Richard, MD ?  insulin aspart (novoLOG) injection 0-6 Units, 0-6 Units, Subcutaneous, TID WC, Wieting, Richard, MD ?  metoprolol succinate (TOPROL-XL) 24 hr tablet 25 mg, 25 mg, Oral, QHS, Loletha Grayer, MD ? ? ?No family history on file. ?Denies any family autoimmune history ? ?Social History:  reports that she has never smoked. She does not have any smokeless tobacco history on file. She reports that she does not drink alcohol and does not use drugs. ? ? ?Exam: ?Current vital signs: ?BP (!) 144/72 (BP Location: Left Arm)   Pulse (!) 101   Temp 98.5 ?F (36.9 ?C) (Oral)   Resp 16   Ht '6\' 1"'$  (1.854 m)   Wt 67.8 kg   SpO2 98%   BMI 19.71 kg/m?  ?Vital signs in last 24 hours: ?Temp:  [98 ?F (36.7 ?C)-100.1 ?F (37.8 ?C)] 98.5 ?F (36.9 ?C) (04/04 1120) ?Pulse Rate:  [82-102] 101 (04/04 1120) ?Resp:  [12-24] 16 (04/04 1120) ?BP: (139-176)/(69-97) 144/72 (04/04 1120) ?SpO2:  [98 %-100 %] 98 % (04/04 1120) ?Weight:  [67.8 kg] 67.8 kg (04/04 1300) ? ? ?Physical Exam  ?Constitutional: Appears chronically ill and cachectic ?Psych: Irritable but intermittently cooperative ?Eyes: No scleral injection ?HENT: No oropharyngeal obstruction.  ?MSK: no joint deformities.  She reports significant tenderness to palpation at several points in the cervical and thoracic spine, but not in the lumbar spine ?Cardiovascular: Perfusing extremities well ?Respiratory: Coughing frequently and quite tachypneic even at rest ?GI: Soft.  No distension. There is no tenderness.  ?Skin: Warm dry and intact visible skin ? ?Neuro: ?Mental Status: ?Patient is  awake, alert, oriented to person, place, month, year, and situation. ?Patient is able to give a clear and coherent history, but limited due to irritability/fatigue ?No signs of aphasia or neglect ?Cranial Nerves: ?II: Visual Fields are full.  ?III,IV, VI: EOMI without ptosis or diploplia.  ?V: Facial sensation is symmetric to light touch ?VII: Facial movement is symmetric.  ?VIII: hearing is intact to voice ?Motor: ?Tone is normal. Bulk is normal.  5/5 strength throughout the left upper extremity, the right upper extremity seems to be significantly pain limited, especially elbow flexion/extension (deltoid testing deferred by patient).  At best she gives 3/5 in elbow flexion/extension on the right, but she is 5/5 in the hand.  She is able to lift both legs antigravity, but refused further testing of the lower extremities ?Sensory: ?Sensation is symmetric to light touch and temperature in the arms and legs. ?Deep Tendon Reflexes: ?2+ and symmetric in the biceps, pain limited in the patella (jumps and screams with attempts to elicit reflex) ?Cerebellar: ?Deferred by patient ?Gait:  ?Deferred by patient due to fatigue ? ?NIHSS total 4 ?Score breakdown: 2 points for right arm weakness and 2 points  for right leg weakness ?Performed at 2:30 PM time of patient arrival to ED  ? ? ?I have reviewed labs in epic and the results pertinent to this consultation are: ? ?Basic Metabolic Panel: ?Recent Labs  ?Lab 12/26/21 ?1621 12/27/21 ?5400  ?NA 136  --   ?K 3.2*  --   ?CL 104  --   ?CO2 22  --   ?GLUCOSE 117*  --   ?BUN 47*  --   ?CREATININE 3.84* 3.49*  ?CALCIUM 7.4*  --   ? ?GFR 12-14  ? ?CBC: ?Recent Labs  ?Lab 12/26/21 ?1621 12/27/21 ?8676  ?WBC 9.6 12.3*  ?NEUTROABS 6.9  --   ?HGB 10.2* 10.3*  ?HCT 32.8* 32.7*  ?MCV 85.2 84.9  ?PLT 234 231  ? ? ?Coagulation Studies: ?Recent Labs  ?  12/26/21 ?1707  ?LABPROT 13.1  ?INR 1.0  ?  ? ? ?I have reviewed the images obtained: ? ?MRI brain personally reviewed, agree with radiology  ?1.  Punctate focus of reduced diffusion within the superior right ?pericallosal white matter, likely a small acute/subacute infarct. No ?hemorrhage or mass effect. ?2. Moderate chronic small vessel ischemic changes an

## 2021-12-27 NOTE — Hospital Course (Addendum)
71 year old female with past medical history of CAD, hypertension and chronic kidney disease stage IV presented with right-sided numbness and weakness. ? ?MRI of the brain shows a punctate focus of reduced diffusion within the right superior pericallosal white matter. MRI of the cervical and thoracic spine showed mild spinal canal stenosis and moderate right neuroforaminal stenosis at C6-7, severe right C5-6 neuroforaminal stenosis. ? ?4/15-patient had a rapid response yesterday afternoon for acute onset of right flank pain.  Work-up showed pulmonary edema, lateral ST depression in the EKG and elevated troponin thought to be due to demand ischemia.  Patient was transferred to stepdown for close monitoring ? ?4/16: Heparin for 48 hours per cardiology conservative management.  To be stopped on 4/17.  Lokelma given for hyperkalemia today.  Nephro, cardio, PCCM following ? ?4/17: Dialysis today followed by administration.  Removal of HD catheter.  Permacath on Wednesday by vascular.  Heparin drip to be stopped later today.  1 PRBC transfusion for hemoglobin of 7 ?

## 2021-12-27 NOTE — Assessment & Plan Note (Addendum)
Potassium 3.2 on presentation.  Careful with replacement secondary to CKD stage IV. ?

## 2021-12-28 ENCOUNTER — Observation Stay: Payer: Medicare HMO

## 2021-12-28 ENCOUNTER — Observation Stay
Admit: 2021-12-28 | Discharge: 2021-12-28 | Disposition: A | Discharge status: Home / Self Care | Payer: Medicare HMO | Attending: Internal Medicine | Admitting: Internal Medicine

## 2021-12-28 DIAGNOSIS — I5023 Acute on chronic systolic (congestive) heart failure: Secondary | ICD-10-CM | POA: Diagnosis present

## 2021-12-28 DIAGNOSIS — R531 Weakness: Secondary | ICD-10-CM | POA: Diagnosis not present

## 2021-12-28 DIAGNOSIS — N189 Chronic kidney disease, unspecified: Secondary | ICD-10-CM | POA: Diagnosis not present

## 2021-12-28 DIAGNOSIS — D631 Anemia in chronic kidney disease: Secondary | ICD-10-CM | POA: Diagnosis present

## 2021-12-28 DIAGNOSIS — R29703 NIHSS score 3: Secondary | ICD-10-CM | POA: Diagnosis not present

## 2021-12-28 DIAGNOSIS — G8191 Hemiplegia, unspecified affecting right dominant side: Secondary | ICD-10-CM | POA: Diagnosis present

## 2021-12-28 DIAGNOSIS — E8889 Other specified metabolic disorders: Secondary | ICD-10-CM | POA: Diagnosis present

## 2021-12-28 DIAGNOSIS — B181 Chronic viral hepatitis B without delta-agent: Secondary | ICD-10-CM | POA: Diagnosis present

## 2021-12-28 DIAGNOSIS — J9601 Acute respiratory failure with hypoxia: Secondary | ICD-10-CM | POA: Diagnosis present

## 2021-12-28 DIAGNOSIS — I132 Hypertensive heart and chronic kidney disease with heart failure and with stage 5 chronic kidney disease, or end stage renal disease: Secondary | ICD-10-CM | POA: Diagnosis present

## 2021-12-28 DIAGNOSIS — J1008 Influenza due to other identified influenza virus with other specified pneumonia: Secondary | ICD-10-CM | POA: Diagnosis present

## 2021-12-28 DIAGNOSIS — I429 Cardiomyopathy, unspecified: Secondary | ICD-10-CM | POA: Diagnosis present

## 2021-12-28 DIAGNOSIS — E875 Hyperkalemia: Secondary | ICD-10-CM | POA: Diagnosis not present

## 2021-12-28 DIAGNOSIS — I10 Essential (primary) hypertension: Secondary | ICD-10-CM | POA: Diagnosis not present

## 2021-12-28 DIAGNOSIS — E1122 Type 2 diabetes mellitus with diabetic chronic kidney disease: Secondary | ICD-10-CM | POA: Diagnosis present

## 2021-12-28 DIAGNOSIS — N2581 Secondary hyperparathyroidism of renal origin: Secondary | ICD-10-CM | POA: Diagnosis present

## 2021-12-28 DIAGNOSIS — N186 End stage renal disease: Secondary | ICD-10-CM | POA: Diagnosis present

## 2021-12-28 DIAGNOSIS — R29701 NIHSS score 1: Secondary | ICD-10-CM | POA: Diagnosis present

## 2021-12-28 DIAGNOSIS — M542 Cervicalgia: Secondary | ICD-10-CM | POA: Diagnosis not present

## 2021-12-28 DIAGNOSIS — Z992 Dependence on renal dialysis: Secondary | ICD-10-CM | POA: Diagnosis not present

## 2021-12-28 DIAGNOSIS — N170 Acute kidney failure with tubular necrosis: Secondary | ICD-10-CM | POA: Diagnosis present

## 2021-12-28 DIAGNOSIS — Z20822 Contact with and (suspected) exposure to covid-19: Secondary | ICD-10-CM | POA: Diagnosis present

## 2021-12-28 DIAGNOSIS — R29706 NIHSS score 6: Secondary | ICD-10-CM | POA: Diagnosis not present

## 2021-12-28 DIAGNOSIS — I639 Cerebral infarction, unspecified: Secondary | ICD-10-CM | POA: Diagnosis present

## 2021-12-28 DIAGNOSIS — N179 Acute kidney failure, unspecified: Secondary | ICD-10-CM | POA: Diagnosis not present

## 2021-12-28 DIAGNOSIS — J122 Parainfluenza virus pneumonia: Secondary | ICD-10-CM | POA: Diagnosis present

## 2021-12-28 DIAGNOSIS — J9811 Atelectasis: Secondary | ICD-10-CM | POA: Diagnosis present

## 2021-12-28 DIAGNOSIS — J181 Lobar pneumonia, unspecified organism: Secondary | ICD-10-CM | POA: Diagnosis not present

## 2021-12-28 DIAGNOSIS — E871 Hypo-osmolality and hyponatremia: Secondary | ICD-10-CM | POA: Diagnosis present

## 2021-12-28 DIAGNOSIS — R29702 NIHSS score 2: Secondary | ICD-10-CM | POA: Diagnosis not present

## 2021-12-28 DIAGNOSIS — E1169 Type 2 diabetes mellitus with other specified complication: Secondary | ICD-10-CM | POA: Diagnosis not present

## 2021-12-28 LAB — RESPIRATORY PANEL BY PCR

## 2021-12-28 LAB — GLUCOSE, CAPILLARY
Glucose-Capillary: 92 mg/dL (ref 70–99)
Glucose-Capillary: 131 mg/dL — ABNORMAL HIGH (ref 70–99)
Glucose-Capillary: 130 mg/dL — ABNORMAL HIGH (ref 70–99)
Glucose-Capillary: 137 mg/dL — ABNORMAL HIGH (ref 70–99)

## 2021-12-28 LAB — HEMOGLOBIN A1C
Hgb A1c MFr Bld: 5.5 % (ref 4.8–5.6)
Mean Plasma Glucose: 111 mg/dL

## 2021-12-28 LAB — COMPREHENSIVE METABOLIC PANEL
ALT: 6 U/L (ref 0–44)
AST: 11 U/L — ABNORMAL LOW (ref 15–41)
Albumin: 2.6 g/dL — ABNORMAL LOW (ref 3.5–5.0)
Alkaline Phosphatase: 95 U/L (ref 38–126)
Anion gap: 9 (ref 5–15)
BUN: 44 mg/dL — ABNORMAL HIGH (ref 8–23)
CO2: 22 mmol/L (ref 22–32)
Calcium: 7.9 mg/dL — ABNORMAL LOW (ref 8.9–10.3)
Chloride: 109 mmol/L (ref 98–111)
Creatinine, Ser: 3.19 mg/dL — ABNORMAL HIGH (ref 0.44–1.00)
GFR, Estimated: 15 mL/min — ABNORMAL LOW (ref >60)
Glucose, Bld: 140 mg/dL — ABNORMAL HIGH (ref 70–99)
Potassium: 4.3 mmol/L (ref 3.5–5.1)
Sodium: 140 mmol/L (ref 135–145)
Total Bilirubin: 0.3 mg/dL (ref 0.3–1.2)
Total Protein: 6.7 g/dL (ref 6.5–8.1)

## 2021-12-28 LAB — CBC
HCT: 29 % — ABNORMAL LOW (ref 36.0–46.0)
Hemoglobin: 9.1 g/dL — ABNORMAL LOW (ref 12.0–15.0)
MCH: 26.5 pg (ref 26.0–34.0)
MCHC: 31.4 g/dL (ref 30.0–36.0)
MCV: 84.5 fL (ref 80.0–100.0)
Platelets: 233 10*3/uL (ref 150–400)
RBC: 3.43 MIL/uL — ABNORMAL LOW (ref 3.87–5.11)
RDW: 14.4 % (ref 11.5–15.5)
WBC: 18.2 10*3/uL — ABNORMAL HIGH (ref 4.0–10.5)
nRBC: 0 % (ref 0.0–0.2)

## 2021-12-28 LAB — IRON AND TIBC
Iron: 14 ug/dL — ABNORMAL LOW (ref 28–170)
Saturation Ratios: 7 % — ABNORMAL LOW (ref 10.4–31.8)
TIBC: 200 ug/dL — ABNORMAL LOW (ref 250–450)
UIBC: 186 ug/dL

## 2021-12-28 LAB — ECHOCARDIOGRAM COMPLETE BUBBLE STUDY
AR max vel: 2.45 cm2
AV Area VTI: 2.66 cm2
AV Area mean vel: 2.26 cm2
AV Mean grad: 4 mmHg
AV Peak grad: 6.9 mmHg
Ao pk vel: 1.31 m/s
Area-P 1/2: 8.16 cm2
MV M vel: 5.89 m/s
MV Peak grad: 138.8 mmHg
MV VTI: 2.38 cm2
S' Lateral: 3.6 cm

## 2021-12-28 LAB — FERRITIN: Ferritin: 117 ng/mL (ref 11–307)

## 2021-12-28 LAB — PROCALCITONIN: Procalcitonin: 0.43 ng/mL

## 2021-12-28 MED ORDER — ATORVASTATIN CALCIUM 20 MG PO TABS
80.0000 mg | ORAL_TABLET | Freq: Every evening | ORAL | Status: DC
  Administered 2021-12-28 – 2022-01-05 (×8): 80 mg via ORAL
  Filled 2021-12-28 (×8): qty 4

## 2021-12-28 MED ORDER — CLOPIDOGREL BISULFATE 75 MG PO TABS
75.0000 mg | ORAL_TABLET | Freq: Every day | ORAL | Status: DC
Start: 2021-12-29 — End: 2022-01-13
  Administered 2021-12-29 – 2022-01-12 (×15): 75 mg via ORAL
  Filled 2021-12-28 (×15): qty 1

## 2021-12-28 NOTE — Plan of Care (Signed)
?  Problem: Education: ?Goal: Knowledge of General Education information will improve ?Description: Including pain rating scale, medication(s)/side effects and non-pharmacologic comfort measures ?Outcome: Progressing ?  ?Problem: Health Behavior/Discharge Planning: ?Goal: Ability to manage health-related needs will improve ?Outcome: Progressing ?  ?Problem: Clinical Measurements: ?Goal: Ability to maintain clinical measurements within normal limits will improve ?Outcome: Progressing ?Goal: Will remain free from infection ?Outcome: Progressing ?Goal: Diagnostic test results will improve ?Outcome: Progressing ?Goal: Respiratory complications will improve ?Outcome: Progressing ?Goal: Cardiovascular complication will be avoided ?Outcome: Progressing ?  ?Problem: Activity: ?Goal: Risk for activity intolerance will decrease ?Outcome: Progressing ?  ?Problem: Nutrition: ?Goal: Adequate nutrition will be maintained ?Outcome: Progressing ?  ?Problem: Coping: ?Goal: Level of anxiety will decrease ?Outcome: Progressing ?  ?Problem: Elimination: ?Goal: Will not experience complications related to bowel motility ?Outcome: Progressing ?Goal: Will not experience complications related to urinary retention ?Outcome: Progressing ?  ?Problem: Pain Managment: ?Goal: General experience of comfort will improve ?Outcome: Progressing ?  ?Problem: Safety: ?Goal: Ability to remain free from injury will improve ?Outcome: Progressing ?  ?Problem: Skin Integrity: ?Goal: Risk for impaired skin integrity will decrease ?Outcome: Progressing ?  ?Problem: Education: ?Goal: Knowledge of disease or condition will improve ?Outcome: Progressing ?Goal: Knowledge of secondary prevention will improve (SELECT ALL) ?Outcome: Progressing ?Goal: Knowledge of patient specific risk factors will improve (INDIVIDUALIZE FOR PATIENT) ?Outcome: Progressing ?  ?

## 2021-12-28 NOTE — Plan of Care (Signed)
MRI cervical spine personally reviewed, agree with radiology that she had some severe neuroforaminal stenosis at the right C5/C6 nerve root which could certainly be explaining her right upper extremity proximal greater than distal weakness on my examination yesterday. ? ?She also appears to have tested positive for parainfluenza virus with rising leukocytosis. ? ?I do recommend neurosurgery evaluation given the significant functional impairment in her dominant hand ? ?Lesleigh Noe MD-PhD ?[facility] ?[phone] ?[facility] coverage for [facility] is from 8 AM to 4 AM in-house and 4 PM to 8 PM by telephone/video. 8 PM to 8 AM emergent questions or overnight urgent questions should be addressed to Teleneurology On-call or [facility] neurohospitalist; contact information can be found on AMION ? ?No charge plan of care note ?

## 2021-12-28 NOTE — Progress Notes (Signed)
*  PRELIMINARY RESULTS* ?Echocardiogram ?2D Echocardiogram has been performed. ? ?Deanna [name] ?12/28/2021, 3:06 PM ?

## 2021-12-28 NOTE — Progress Notes (Signed)
Asked to bedside by Remo Lipps with Cardiology to participate in echocardiogram with bubble study.  Utilizing right PIV, post flush, completed bubble study.  Post bubble study, reclamped PIV. ?

## 2021-12-28 NOTE — Progress Notes (Signed)
Neurology Progress Note ? ?Major interval events/subjective: ?- Feeling much improved  ?- Possible visual hallucination reported to family which family reports they think may have been to garner their attention  ? ?Exam: ?Vitals:  ? 12/28/21 0742 12/28/21 1202  ?BP: 139/62 (!) 149/82  ?Pulse: 81 87  ?Resp: 16 16  ?Temp: 98.5 ?F (36.9 ?C) 99.7 ?F (37.6 ?C)  ?SpO2: 100% 99%  ? ?Gen: In bed, comfortable  ?Resp: non-labored breathing, no grossly audible wheezing, no longer coughing ?Cardiac: Perfusing extremities well  ? ?Neuro: ?MS: Awake, alert, oriented to situation and recalls internal medicine Dr.'s name ?CN: face symmetric, EOMI,  ?Motor: 5/5 bilateral UE.  ?Gait: ambulates with walker with a stooped gait ? ? ?Pertinent data: ? ?Positive for parainfluenza  ? ? ?Basic Metabolic Panel: ?Recent Labs  ?Lab 12/26/21 ?1621 12/27/21 ?3474 12/28/21 ?2595  ?NA 136  --  140  ?K 3.2*  --  4.3  ?CL 104  --  109  ?CO2 22  --  22  ?GLUCOSE 117*  --  140*  ?BUN 47*  --  44*  ?CREATININE 3.84* 3.49* 3.19*  ?CALCIUM 7.4*  --  7.9*  ? ? ?CBC: ?Recent Labs  ?Lab 12/26/21 ?1621 12/27/21 ?6387 12/28/21 ?5643  ?WBC 9.6 12.3* 18.2*  ?NEUTROABS 6.9  --   --   ?HGB 10.2* 10.3* 9.1*  ?HCT 32.8* 32.7* 29.0*  ?MCV 85.2 84.9 84.5  ?PLT 234 231 233  ? ? ?Coagulation Studies: ?Recent Labs  ?  12/26/21 ?1707  ?LABPROT 13.1  ?INR 1.0  ?  ?Lab Results  ?Component Value Date  ? CHOL 182 12/27/2021  ? HDL 80 12/27/2021  ? [place] 87 12/27/2021  ? TRIG 77 12/27/2021  ? CHOLHDL 2.3 12/27/2021  ? ? ?Lab Results  ?Component Value Date  ? HGBA1C 5.5 12/27/2021  ? ? ? ?MRI C-spine and T-spine personally reviewed ?1. Mild spinal canal stenosis and moderate right neural foraminal ?stenosis at C6-7. ?2. Severe right C5-6 neural foraminal stenosis. ?3. Moderate bilateral T11-12 neural foraminal stenosis ? ?Impression: Patient has returned to baseline and has markedly improved.  Discussed with family at bedside that her very small punctate stroke may be  related to her recent catheterization, and would be expected to be asymptomatic.  Discussed outpatient follow-up with Dr. Lacinda Axon for her neuroforaminal stenosis which may have contributed to her right arm weakness on my examination yesterday ? ?Recommendations: ?-Continue dual antiplatelet therapy for 21 days, then aspirin monotherapy ?-Increase atorvastatin to 80 mg nightly for LDL goal < 70 ?-No further inpatient neurological work-up indicated ? ?Lesleigh Noe MD-PhD ?[facility] ?[phone]  ? ?Greater than 35 minutes were spent in care of this patient today of which greater than 50% was at bedside discussing results and plan with family ?

## 2021-12-28 NOTE — Progress Notes (Signed)
?PROGRESS NOTE ? ? ? ?Deanna [name]  WER:[id number] DOB: 02/11/1951 DOA: 12/26/2021 ?PCP: [facility] ? ?Assessment & Plan: ?  ?Principal Problem: ?  Right sided weakness ?Active Problems: ?  Acute CVA (cerebrovascular accident) ([place]) ?  Acute kidney injury superimposed on chronic kidney disease ([place]) ?  Lobar pneumonia ([place]) ?  Essential (primary) hypertension ?  HFrEF (heart failure with reduced ejection fraction) ([place]) ?  Type 2 diabetes mellitus without complications ([place]) ?  Hypokalemia ?  Anemia ? ?  ?  ?Acute CVA: as per MRI. Continue on aspirin, plavix & statin. Neuro following and recs apprec  ?  ?Lobar pneumonia: possibly secondary to parainfluenza virus 3 & possible superimposed bacterial infection. Continue on IV rocephin, azithromycin, bronchodilators. COVID19 was neg. [place]cal 0.43 ? 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 F/u w/ neuro surg outpatient, Dr. Lacinda Axon  ?  ?AKI on CKDIV: baseline Cr 3.19. Cr is labile. Avoid nephrotoxic meds  ? ?ACD: likely secondary to CKD. No need for a transfusion currently  ?  ?Hypokalemia: WNL today  ?  ?DM2: well controlled, HbA1c 5.5. Continue on SSI w/ accuchecks  ?  ?Chronic systolic CHF: appears euvolemic. Hold lasix. Monitor I/Os  ?  ?HTN: continue on metoprolol  ? ? ?DVT prophylaxis: SCDs ?Code Status: full  ?Family Communication: discussed pt's care w/ pt's granddaughter, [name], and answered her questions  ?Disposition Plan: depends on PT/OT recs ? ?Level of care: Telemetry Medical ? ?Status is: Observation ?The patient remains OBS appropriate and will d/c before 2 midnights. ? ? ? ?Consultants:  ?Neuro ?Neuro surg  ? ?Procedures: ? ?Antimicrobials: azithromycin, rocephin  ? ? ?Subjective: ?Pt c/o fatigue  ? ?Objective: ?Vitals:  ? 12/27/21 1724 12/27/21 1736 12/27/21 1926 12/28/21 1950  ?BP: (!) 167/72  (!) 150/78 (!) 143/72  ?Pulse: (!) 116 (!) 109 97 84   ?Resp: '18  20 17  '$ ?Temp: (!) 100.4 ?F (38 ?C)  98.4 ?F (36.9 ?C) (!) 97.2 ?F (36.2 ?C)  ?TempSrc: Oral  Oral Oral  ?SpO2: 100%  99% 100%  ?Weight:      ?Height:      ? ? ?Intake/Output Summary (Last 24 hours) at 12/28/2021 0740 ?Last data filed at 12/27/2021 1500 ?Gross per 24 hour  ?Intake 550 ml  ?Output --  ?Net 550 ml  ? ?Filed Weights  ? 12/27/21 1300  ?Weight: 67.8 kg  ? ? ?Examination: ? ?General exam: Appears calm and comfortable  ?Respiratory system: diminished breath sounds b/l  ?Cardiovascular system: S1 & S2 +. No rubs, gallops or clicks. \ ?Gastrointestinal system: Abdomen is nondistended, soft and nontender. Normal bowel sounds heard. ?Central nervous system: Alert and oriented. Moves all extremities, decreased ROM of b/l LE ?Psychiatry: Judgement and insight appear normal. Flat mood and affect.  ? ? ? ?Data Reviewed: I have personally reviewed following labs and imaging studies ? ?CBC: ?Recent Labs  ?Lab 12/26/21 ?1621 12/27/21 ?9326 12/28/21 ?7124  ?WBC 9.6 12.3* 18.2*  ?NEUTROABS 6.9  --   --   ?HGB 10.2* 10.3* 9.1*  ?HCT 32.8* 32.7* 29.0*  ?MCV 85.2 84.9 84.5  ?PLT 234 231 233  ? ?Basic Metabolic Panel: ?Recent Labs  ?Lab 12/26/21 ?1621 12/27/21 ?5809 12/28/21 ?9833  ?NA 136  --  140  ?K 3.2*  --  4.3  ?CL 104  --  109  ?CO2 22  --  22  ?GLUCOSE 117*  --  140*  ?BUN 47*  --  44*  ?CREATININE 3.84* 3.49* 3.19*  ?CALCIUM 7.4*  --  7.9*  ? ?GFR: ?Estimated Creatinine Clearance: 17.6 mL/min (A) (by C-G formula based on SCr of 3.19 mg/dL (H)). ?Liver Function Tests: ?Recent Labs  ?Lab 12/26/21 ?1621 12/28/21 ?6578  ?AST 13* 11*  ?ALT 8 6  ?ALKPHOS 116 95  ?BILITOT 0.4 0.3  ?PROT 7.6 6.7  ?ALBUMIN 3.2* 2.6*  ? ?No results for input(s): LIPASE, AMYLASE in the last 168 hours. ?No results for input(s): AMMONIA in the last 168 hours. ?Coagulation Profile: ?Recent Labs  ?Lab 12/26/21 ?1707  ?INR 1.0  ? ?Cardiac Enzymes: ?No results for input(s): CKTOTAL, CKMB, CKMBINDEX, TROPONINI in the last 168 hours. ?BNP  (last 3 results) ?No results for input(s): PROBNP in the last 8760 hours. ?HbA1C: ?Recent Labs  ?  12/27/21 ?0611  ?HGBA1C 5.5  ? ?CBG: ?Recent Labs  ?Lab 12/27/21 ?1242 12/27/21 ?1726 12/27/21 ?2133  ?GLUCAP 152* 124* 146*  ? ?Lipid Profile: ?Recent Labs  ?  12/27/21 ?0611  ?CHOL 182  ?HDL 80  ?[place] 87  ?TRIG 77  ?CHOLHDL 2.3  ? ?Thyroid Function Tests: ?No results for input(s): TSH, T4TOTAL, FREET4, T3FREE, THYROIDAB in the last 72 hours. ?Anemia Panel: ?Recent Labs  ?  12/28/21 ?0611  ?FERRITIN 117  ?TIBC 200*  ?IRON 14*  ? ?Sepsis Labs: ?No results for input(s): PROCALCITON, LATICACIDVEN in the last 168 hours. ? ?Recent Results (from the past 240 hour(s))  ?Respiratory (~20 pathogens) panel by PCR     Status: Abnormal  ? Collection Time: 12/27/21 11:32 AM  ? Specimen: Nasopharyngeal Swab; Respiratory  ?Result Value Ref Range Status  ? Adenovirus NOT DETECTED NOT DETECTED Final  ? Coronavirus 229E NOT DETECTED NOT DETECTED Final  ?  Comment: (NOTE) ?The Coronavirus on the Respiratory Panel, DOES NOT test for the novel  ?Coronavirus (2019 nCoV) ?  ? Coronavirus HKU1 NOT DETECTED NOT DETECTED Final  ? Coronavirus NL63 NOT DETECTED NOT DETECTED Final  ? Coronavirus OC43 NOT DETECTED NOT DETECTED Final  ? Metapneumovirus NOT DETECTED NOT DETECTED Final  ? Rhinovirus / Enterovirus NOT DETECTED NOT DETECTED Final  ? Influenza A NOT DETECTED NOT DETECTED Final  ? Influenza B NOT DETECTED NOT DETECTED Final  ? Parainfluenza Virus 1 NOT DETECTED NOT DETECTED Final  ? Parainfluenza Virus 2 NOT DETECTED NOT DETECTED Final  ? Parainfluenza Virus 3 DETECTED (A) NOT DETECTED Final  ? Parainfluenza Virus 4 NOT DETECTED NOT DETECTED Final  ? Respiratory Syncytial Virus NOT DETECTED NOT DETECTED Final  ? Bordetella pertussis NOT DETECTED NOT DETECTED Final  ? Bordetella Parapertussis NOT DETECTED NOT DETECTED Final  ? Chlamydophila pneumoniae NOT DETECTED NOT DETECTED Final  ? Mycoplasma pneumoniae NOT DETECTED NOT DETECTED  Final  ?  Comment: Performed at [facility], [place] [address], [place], [place] [address]  ?Resp Panel by RT-PCR (Flu A&B, Covid) Nasopharyngeal Swab     Status: None  ? Collection Time: 12/27/21 11:32 AM  ? Specimen: Nasopharyngeal Swab; Nasopharyngeal(NP) swabs in vial transport medium  ?Result Value Ref Range Status  ? SARS Coronavirus 2 by RT PCR NEGATIVE NEGATIVE Final  ?  Comment: (NOTE) ?SARS-CoV-2 target nucleic acids are NOT DETECTED. ? ?The SARS-CoV-2 RNA is generally detectable in upper respiratory ?specimens during the acute phase of infection. The lowest ?concentration of SARS-CoV-2 viral copies this assay can detect is ?138 copies/mL. A negative result does not preclude SARS-Cov-2 ?infection and should not be used  as the sole basis for treatment or ?other patient management decisions. A negative result may occur with  ?improper specimen collection/handling, submission of specimen other ?than nasopharyngeal swab, presence of viral mutation(s) within the ?areas targeted by this assay, and inadequate number of viral ?copies(<138 copies/mL). A negative result must be combined with ?clinical observations, patient history, and epidemiological ?information. The expected result is Negative. ? ?Fact Sheet for Patients:  ?EntrepreneurPulse.com.au ? ?Fact Sheet for Healthcare Providers:  ?IncredibleEmployment.be ? ?This test is no t yet approved or cleared by the [place] FDA and  ?has been authorized for detection and/or diagnosis of SARS-CoV-2 by ?FDA under an Emergency Use Authorization (EUA). This EUA will remain  ?in effect (meaning this test can be used) for the duration of the ?COVID-19 declaration under Section 564(b)(1) of the Act, 21 ?U.S.C.section 360bbb-3(b)(1), unless the authorization is terminated  ?or revoked sooner.  ? ? ?  ? Influenza A by PCR NEGATIVE NEGATIVE Final  ? Influenza B by PCR NEGATIVE NEGATIVE Final  ?  Comment: (NOTE) ?The Xpert Xpress  SARS-CoV-2/FLU/RSV plus assay is intended as an aid ?in the diagnosis of influenza from Nasopharyngeal swab specimens and ?should not be used as a sole basis for treatment. Nasal washings and ?aspirates are u

## 2021-12-29 ENCOUNTER — Encounter: Payer: Self-pay | Admitting: Internal Medicine

## 2021-12-29 DIAGNOSIS — I10 Essential (primary) hypertension: Secondary | ICD-10-CM | POA: Diagnosis not present

## 2021-12-29 DIAGNOSIS — J181 Lobar pneumonia, unspecified organism: Secondary | ICD-10-CM | POA: Diagnosis not present

## 2021-12-29 DIAGNOSIS — I639 Cerebral infarction, unspecified: Secondary | ICD-10-CM | POA: Diagnosis not present

## 2021-12-29 LAB — BASIC METABOLIC PANEL
Anion gap: 10 (ref 5–15)
BUN: 45 mg/dL — ABNORMAL HIGH (ref 8–23)
CO2: 21 mmol/L — ABNORMAL LOW (ref 22–32)
Calcium: 7.8 mg/dL — ABNORMAL LOW (ref 8.9–10.3)
Chloride: 107 mmol/L (ref 98–111)
Creatinine, Ser: 3.31 mg/dL — ABNORMAL HIGH (ref 0.44–1.00)
GFR, Estimated: 14 mL/min — ABNORMAL LOW (ref >60)
Glucose, Bld: 99 mg/dL (ref 70–99)
Potassium: 4.3 mmol/L (ref 3.5–5.1)
Sodium: 138 mmol/L (ref 135–145)

## 2021-12-29 LAB — CBC
HCT: 30.4 % — ABNORMAL LOW (ref 36.0–46.0)
Hemoglobin: 9.4 g/dL — ABNORMAL LOW (ref 12.0–15.0)
MCH: 26.4 pg (ref 26.0–34.0)
MCHC: 30.9 g/dL (ref 30.0–36.0)
MCV: 85.4 fL (ref 80.0–100.0)
Platelets: 253 K/uL (ref 150–400)
RBC: 3.56 MIL/uL — ABNORMAL LOW (ref 3.87–5.11)
RDW: 14.4 % (ref 11.5–15.5)
WBC: 17 K/uL — ABNORMAL HIGH (ref 4.0–10.5)
nRBC: 0 % (ref 0.0–0.2)

## 2021-12-29 MED ORDER — ONDANSETRON HCL 4 MG/2ML IJ SOLN
4.0000 mg | Freq: Four times a day (QID) | INTRAMUSCULAR | Status: DC | PRN
  Administered 2021-12-30: 4 mg via INTRAVENOUS
  Filled 2021-12-29: qty 2

## 2021-12-29 MED ORDER — BENZONATATE 100 MG PO CAPS
200.0000 mg | ORAL_CAPSULE | Freq: Three times a day (TID) | ORAL | Status: DC | PRN
  Administered 2021-12-29 – 2022-01-04 (×6): 200 mg via ORAL
  Filled 2021-12-29 (×6): qty 2

## 2021-12-29 NOTE — Care Management (Cosign Needed)
Patient will require a commode chair with fixed arms ([id number])  ?The beneficiary is physically incapable of utilizing regular toilet facilities, as would occurring when:  ?The beneficiary is confined to one level of the home environment and there is no toilet on that level and the detachable arms feature is necessary to facilitate transferring the beneficiary.   ?

## 2021-12-29 NOTE — Plan of Care (Signed)
?  Problem: Education: ?Goal: Knowledge of General Education information will improve ?Description: Including pain rating scale, medication(s)/side effects and non-pharmacologic comfort measures ?Outcome: Progressing ?  ?Problem: Health Behavior/Discharge Planning: ?Goal: Ability to manage health-related needs will improve ?Outcome: Progressing ?  ?Problem: Clinical Measurements: ?Goal: Ability to maintain clinical measurements within normal limits will improve ?Outcome: Progressing ?Goal: Will remain free from infection ?Outcome: Progressing ?Goal: Diagnostic test results will improve ?Outcome: Progressing ?Goal: Respiratory complications will improve ?Outcome: Progressing ?Goal: Cardiovascular complication will be avoided ?Outcome: Progressing ?  ?Problem: Activity: ?Goal: Risk for activity intolerance will decrease ?Outcome: Progressing ?  ?Problem: Nutrition: ?Goal: Adequate nutrition will be maintained ?Outcome: Progressing ?  ?Problem: Coping: ?Goal: Level of anxiety will decrease ?Outcome: Progressing ?  ?Problem: Elimination: ?Goal: Will not experience complications related to bowel motility ?Outcome: Progressing ?Goal: Will not experience complications related to urinary retention ?Outcome: Progressing ?  ?Problem: Pain Managment: ?Goal: General experience of comfort will improve ?Outcome: Progressing ?  ?Problem: Safety: ?Goal: Ability to remain free from injury will improve ?Outcome: Progressing ?  ?Problem: Skin Integrity: ?Goal: Risk for impaired skin integrity will decrease ?Outcome: Progressing ?  ?Problem: Education: ?Goal: Knowledge of disease or condition will improve ?Outcome: Progressing ?Goal: Knowledge of secondary prevention will improve (SELECT ALL) ?Outcome: Progressing ?Goal: Knowledge of patient specific risk factors will improve (INDIVIDUALIZE FOR PATIENT) ?Outcome: Progressing ?  ?

## 2021-12-29 NOTE — Progress Notes (Signed)
PT Cancellation Note ? ?Patient Details ?Name: Alazae Sistrunk ?MRN: [id number] ?DOB: 09-27-1950 ? ? ?Cancelled Treatment:    Reason Eval/Treat Not Completed: Other (comment) ?Entered pt's room, she was pleasant but refused PT this date stating "I've been throwing up all day, I just don't feel like it."  Will maintain on caseload and see as appropriate.  ? ?Kreg Shropshire, DPT ?12/29/2021, 5:22 PM ?

## 2021-12-29 NOTE — Progress Notes (Signed)
?PROGRESS NOTE ? ? ? ?Deanna [name]  GMW:[id number] DOB: 06-May-1951 DOA: 12/26/2021 ?PCP: [facility] ? ?Assessment & Plan: ?  ?Principal Problem: ?  Right sided weakness ?Active Problems: ?  Acute CVA (cerebrovascular accident) ([place]) ?  Acute kidney injury superimposed on chronic kidney disease ([place]) ?  Lobar pneumonia ([place]) ?  Essential (primary) hypertension ?  HFrEF (heart failure with reduced ejection fraction) ([place]) ?  Type 2 diabetes mellitus without complications ([place]) ?  Hypokalemia ?  Anemia ?  CVA (cerebral vascular accident) [facility]) ? ?  ?  ?Acute CVA: as per MRI. Continue on aspirin, plavix x 21 days and then just aspirin monotherapy as per neuro. Continue on statin  ?  ?Lobar pneumonia: possibly secondary to parainfluenza virus 3 & possible superimposed bacterial infection. Continue on IV ceftriaxone, azithromycin, bronchodilators. Influenza, COVID19 were both neg. Procal 0.43. Spiked a fever and will need to be afebrile x 24 hrs before d/c  ? 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 F/u w/ neuro surg outpatient, Dr. Lacinda Axon  ?  ?AKI on CKDIV: baseline Cr 3.19. Cr is trending up slightly today  ? ?ACD: likely secondary to CKD. No need for a transfusion currently  ?  ?Hypokalemia: WNL today  ?  ?DM2: HbA1c 5.5, well controlled. Continue on SSI w/ accuchecks  ?  ?Chronic systolic CHF: appears euvolemic. Monitor I/Os. Continue on metoprolol & hold lasix  ?  ?HTN: continue on metoprolol ? ? ?DVT prophylaxis: SCDs ?Code Status: full  ?Family Communication: discussed pt's care w/ pt's adopted granddaughter, [name], and answered her questions  ?Disposition Plan: depends on PT/OT recs ? ?Level of care: Telemetry Medical ? ?Status is: Inpatient ?Remains inpatient appropriate because: spiked a fever, needs to afebrile x 24 hrs prior to d/c  ? ? ? ? ? ?Consultants:  ?Neuro ?Neuro surg   ? ?Procedures: ? ?Antimicrobials: azithromycin, rocephin  ? ? ?Subjective: ?Pt c/o malaise  ? ?Objective: ?Vitals:  ? 12/28/21 1648 12/28/21 2009 12/28/21 2200 12/29/21 0450  ?BP: (!) 148/74 (!) 145/73  (!) 147/70  ?Pulse: (!) 102 95  84  ?Resp: '19 18  16  '$ ?Temp: 100.1 ?F (37.8 ?C) (!) 100.7 ?F (38.2 ?C) 99.3 ?F (37.4 ?C) 98.5 ?F (36.9 ?C)  ?TempSrc: Oral Oral Oral   ?SpO2: 99% 97%  99%  ?Weight:      ?Height:      ? ? ?Intake/Output Summary (Last 24 hours) at 12/29/2021 0727 ?Last data filed at 12/29/2021 0600 ?Gross per 24 hour  ?Intake 240 ml  ?Output --  ?Net 240 ml  ? ?Filed Weights  ? 12/27/21 1300  ?Weight: 67.8 kg  ? ? ?Examination: ? ?General exam: Appears comfortable  ?Respiratory system: decreased breath sounds b/l  ?Cardiovascular system: S1&S2+. No rubs or clicks  ?Gastrointestinal system: Abd is soft, NT, ND & normal bowel sounds  ?Central nervous system: Alert and oriented. Moves all extremities  ?Psychiatry: judgement and insight appears normal. Appropriate mood and affect  ? ? ? ?Data Reviewed: I have personally reviewed following labs and imaging studies ? ?CBC: ?Recent Labs  ?Lab 12/26/21 ?1621 12/27/21 ?4403 12/28/21 ?4742 12/29/21 ?5956  ?WBC 9.6 12.3* 18.2* 17.0*  ?NEUTROABS 6.9  --   --   --   ?HGB 10.2* 10.3* 9.1* 9.4*  ?HCT 32.8* 32.7* 29.0* 30.4*  ?MCV 85.2 84.9 84.5 85.4  ?PLT 234 231 233 253  ? ?Basic Metabolic  Panel: ?Recent Labs  ?Lab 12/26/21 ?1621 12/27/21 ?7062 12/28/21 ?3762 12/29/21 ?8315  ?NA 136  --  140 138  ?K 3.2*  --  4.3 4.3  ?CL 104  --  109 107  ?CO2 22  --  22 21*  ?GLUCOSE 117*  --  140* 99  ?BUN 47*  --  44* 45*  ?CREATININE 3.84* 3.49* 3.19* 3.31*  ?CALCIUM 7.4*  --  7.9* 7.8*  ? ?GFR: ?Estimated Creatinine Clearance: 16.9 mL/min (A) (by C-G formula based on SCr of 3.31 mg/dL (H)). ?Liver Function Tests: ?Recent Labs  ?Lab 12/26/21 ?1621 12/28/21 ?1761  ?AST 13* 11*  ?ALT 8 6  ?ALKPHOS 116 95  ?BILITOT 0.4 0.3  ?PROT 7.6 6.7  ?ALBUMIN 3.2* 2.6*  ? ?No results for  input(s): LIPASE, AMYLASE in the last 168 hours. ?No results for input(s): AMMONIA in the last 168 hours. ?Coagulation Profile: ?Recent Labs  ?Lab 12/26/21 ?1707  ?INR 1.0  ? ?Cardiac Enzymes: ?No results for input(s): CKTOTAL, CKMB, CKMBINDEX, TROPONINI in the last 168 hours. ?BNP (last 3 results) ?No results for input(s): PROBNP in the last 8760 hours. ?HbA1C: ?Recent Labs  ?  12/27/21 ?0611  ?HGBA1C 5.5  ? ?CBG: ?Recent Labs  ?Lab 12/27/21 ?2133 12/28/21 ?0743 12/28/21 ?1204 12/28/21 ?1650 12/28/21 ?2109  ?GLUCAP 146* 92 131* 130* 137*  ? ?Lipid Profile: ?Recent Labs  ?  12/27/21 ?0611  ?CHOL 182  ?HDL 80  ?[place] 87  ?TRIG 77  ?CHOLHDL 2.3  ? ?Thyroid Function Tests: ?No results for input(s): TSH, T4TOTAL, FREET4, T3FREE, THYROIDAB in the last 72 hours. ?Anemia Panel: ?Recent Labs  ?  12/28/21 ?0611  ?FERRITIN 117  ?TIBC 200*  ?IRON 14*  ? ?Sepsis Labs: ?Recent Labs  ?Lab 12/28/21 ?0611  ?PROCALCITON 0.43  ? ? ?Recent Results (from the past 240 hour(s))  ?Respiratory (~20 pathogens) panel by PCR     Status: Abnormal  ? Collection Time: 12/27/21 11:32 AM  ? Specimen: Nasopharyngeal Swab; Respiratory  ?Result Value Ref Range Status  ? Adenovirus NOT DETECTED NOT DETECTED Final  ? Coronavirus 229E NOT DETECTED NOT DETECTED Final  ?  Comment: (NOTE) ?The Coronavirus on the Respiratory Panel, DOES NOT test for the novel  ?Coronavirus (2019 nCoV) ?  ? Coronavirus HKU1 NOT DETECTED NOT DETECTED Final  ? Coronavirus NL63 NOT DETECTED NOT DETECTED Final  ? Coronavirus OC43 NOT DETECTED NOT DETECTED Final  ? Metapneumovirus NOT DETECTED NOT DETECTED Final  ? Rhinovirus / Enterovirus NOT DETECTED NOT DETECTED Final  ? Influenza A NOT DETECTED NOT DETECTED Final  ? Influenza B NOT DETECTED NOT DETECTED Final  ? Parainfluenza Virus 1 NOT DETECTED NOT DETECTED Final  ? Parainfluenza Virus 2 NOT DETECTED NOT DETECTED Final  ? Parainfluenza Virus 3 DETECTED (A) NOT DETECTED Final  ? Parainfluenza Virus 4 NOT DETECTED NOT  DETECTED Final  ? Respiratory Syncytial Virus NOT DETECTED NOT DETECTED Final  ? Bordetella pertussis NOT DETECTED NOT DETECTED Final  ? Bordetella Parapertussis NOT DETECTED NOT DETECTED Final  ? Chlamydophila pneumoniae NOT DETECTED NOT DETECTED Final  ? Mycoplasma pneumoniae NOT DETECTED NOT DETECTED Final  ?  Comment: Performed at [facility], [place] [address] [place], [place] [address]  ?Resp Panel by RT-PCR (Flu A&B, Covid) Nasopharyngeal Swab     Status: None  ? Collection Time: 12/27/21 11:32 AM  ? Specimen: Nasopharyngeal Swab; Nasopharyngeal(NP) swabs in vial transport medium  ?Result Value Ref Range Status  ? SARS Coronavirus 2 by  RT PCR NEGATIVE NEGATIVE Final  ?  Comment: (NOTE) ?SARS-CoV-2 target nucleic acids are NOT DETECTED. ? ?The SARS-CoV-2 RNA is generally detectable in upper respiratory ?specimens during the acute phase of infection. The lowest ?concentration of SARS-CoV-2 viral copies this assay can detect is ?138 copies/mL. A negative result does not preclude SARS-Cov-2 ?infection and should not be used as the sole basis for treatment or ?other patient management decisions. A negative result may occur with  ?improper specimen collection/handling, submission of specimen other ?than nasopharyngeal swab, presence of viral mutation(s) within the ?areas targeted by this assay, and inadequate number of viral ?copies(<138 copies/mL). A negative result must be combined with ?clinical observations, patient history, and epidemiological ?information. The expected result is Negative. ? ?Fact Sheet for Patients:  ?EntrepreneurPulse.com.au ? ?Fact Sheet for Healthcare Providers:  ?IncredibleEmployment.be ? ?This test is no t yet approved or cleared by the [place] FDA and  ?has been authorized for detection and/or diagnosis of SARS-CoV-2 by ?FDA under an Emergency Use Authorization (EUA). This EUA will remain  ?in effect (meaning this test can be used) for the  duration of the ?COVID-19 declaration under Section 564(b)(1) of the Act, 21 ?U.S.C.section 360bbb-3(b)(1), unless the authorization is terminated  ?or revoked sooner.  ? ? ?  ? Influenza A by PCR NEGATIVE NEGATIVE Final  ? Influenza B by PCR

## 2021-12-29 NOTE — Progress Notes (Signed)
Tech reported that patient refused blood glucose check. Day team also reported that she refused. Spoke with patient and she is irritible about the frequency of the interventions that we provide. She states that she is "tired of being stuck!".  ?

## 2021-12-29 NOTE — Consult Note (Signed)
Neurosurgery-New Consultation Evaluation ?12/29/2021 ?Deanna [name] [id number] ? ?Identifying Statement: ?Deanna [name] is a 71 y.o. female from [place] [address] with intermittent weakness ? ?Physician Requesting Consultation: Eppie Gibson, MD ? ?History of Present Illness: ?Deanna [name] is admitted for resolved weakness she had on the right side. She states that she felt the weakness in her arm and leg. She felt it increasing over a few days prompting ED visit. 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 She had no left side weakness. She does not report any radiating pain in arms or legs but does have some low back pain. Overall, here, she feels that the weakness has resolved. She does feel some difficulty standing and walking as she states her legs will give out on her at times. She had a MRI of the brain that showed no stroke and MRI cervical spine with some stenosis. We are consulted given this finding.  ? ?Past Medical History:  ?Past Medical History:  ?Diagnosis Date  ? Coronary artery disease   ? Hypertension   ? ? ?Social History: ?Social History  ? ?Socioeconomic History  ? Marital status: Single  ?  Spouse name: Not on file  ? Number of children: Not on file  ? Years of education: Not on file  ? Highest education level: Not on file  ?Occupational History  ? Not on file  ?Tobacco Use  ? Smoking status: Never  ? Smokeless tobacco: Not on file  ?Substance and Sexual Activity  ? Alcohol use: No  ? Drug use: No  ? Sexual activity: Not on file  ?Other Topics Concern  ? Not on file  ?Social History Narrative  ? Not on file  ? ?Social Determinants of Health  ? ?Financial Resource Strain: Not on file  ?Food Insecurity: Not on file  ?Transportation Needs: Not on file  ?Physical Activity: Not on file  ?Stress: Not on file  ?Social Connections: Not on file  ?Intimate Partner Violence: Not on file  ? ?Family History: ?History reviewed. No pertinent family history. ? ?Review of  Systems: ? ?Review of Systems - General ROS: Negative ?Psychological ROS: Negative ?Ophthalmic ROS: Negative ?ENT ROS: Negative ?Hematological and Lymphatic ROS: Negative  ?Endocrine ROS: Negative ?Respiratory ROS: Negative ?Cardiovascular ROS: Negative ?Gastrointestinal ROS: Negative ?Genito-Urinary ROS: Negative ?Musculoskeletal ROS: Positive for back pain ?Neurological ROS: Negative for numbness  ?Dermatological ROS: Negative ? ?Physical Exam: ?BP 132/74 (BP Location: Right Arm)   Pulse 91   Temp 98.2 ?F (36.8 ?C) (Oral)   Resp 18   Ht '6\' 1"'$  (1.854 m)   Wt 67.8 kg   SpO2 100%   BMI 19.71 kg/m?  Body mass index is 19.71 kg/m?Marland Kitchen Body surface area is 1.87 meters squared. ?General appearance: Alert, cooperative, in no acute distress ?Head: Normocephalic, atraumatic ?Eyes: Normal, EOM intact ?Oropharynx: Wearing facemask ?Neck: Supple, ROM appears full ?Ext: No edema in LE bilaterally, good distal pulses ? ?Neurologic exam:  ?Mental status: alertness: alert, affect: normal ?Speech: fluent and clear ?Motor:strength symmetric 5/5 in bilateral upper and lower extremities ?Sensory: intact to light touch in all extremities ?Gait: not tested ? ? ?Imaging: ?MRI Cervical spine: 1. Mild spinal canal stenosis and moderate right neural foraminal ?stenosis at C6-7. 2. Severe right C5-6 neural foraminal stenosis. ?3. Moderate bilateral T11-12 neural foraminal stenosis. ?  ? ? ?Impression/Plan:  ?Deanna [name] is here for evaluation of resolved weakness. Given this presentation, it is unlikely related to anything in the cervical  spine. However, on MRI she does have some central stenosis that is not acute but it is reasonable to see her in clinic to go over in detail. Patient requires no surgical intervention at this time.  ? ? ?1.  Diagnosis: Cervical spinal stenosis ? ?2.  Plan ?- Follow up as outpatient ?  ?

## 2021-12-29 NOTE — TOC Initial Note (Addendum)
Transition of Care (TOC) - Initial/Assessment Note  ? ? ?Patient Details  ?Name: Deanna [name] ?MRN: [id number] ?Date of Birth: March 27, 1951 ? ?Transition of Care (TOC) CM/SW Contact:    ?Pete Pelt, RN ?Phone Number: ?12/29/2021, 2:37 PM ? ?Clinical Narrative:    Patient lives at home with family.  She states she is up to date with her PCP, and family transports her and waits with her.  Patient denies any concerns with medications.               ?  ?Patient is amenable to DME being delivered to her hospital room and home health.  [facility] will accept patient.  Patient has no concerns about returning home at discharge.  TOC contact information provided.  TOC to follow for needs. ? ?Addendum 1511 hours:  Patient requested rolling walker.  RNCM discussed with PT, as it was not initially recommended. PT agrees with request since patient is not known to have one at home.  Equipment requested with [employer] to be delivered to room ? ?Correction 0855 on 12/30/2021:  [facility] to serve patient, not [facility]. ? ?Expected Discharge Plan: [place] ?Barriers to Discharge: Continued Medical Work up ? ? ?Patient Goals and CMS Choice ?  ?  ?  ? ?Expected Discharge Plan and Services ?Expected Discharge Plan: [place] ?  ?Discharge Planning Services: CM Consult ?Post Acute Care Choice: Durable Medical Equipment, Home Health ?Living arrangements for the past 2 months: [place] ?                ?DME Arranged: 3-N-1 ?DME Agency: [employer] ?Date DME Agency Contacted: 12/29/21 ?Time DME Agency Contacted: [phone] ?Representative spoke with at DME Agency: Suanne Marker ?HH Arranged: OT, PT ?  ?  ?  ?  ? ?Prior Living Arrangements/Services ?Living arrangements for the past 2 months: [place] ?Lives with:: Self, Relatives ?Patient language and need for interpreter reviewed:: Yes (No interpreter required) ?Do you feel safe going back to the place where you live?: Yes      ?Need for  Family Participation in Patient Care: Yes (Comment) ?Care giver support system in place?: Yes (comment) ?Current home services: DME, Home OT ?Criminal Activity/Legal Involvement Pertinent to Current Situation/Hospitalization: No - Comment as needed ? ?Activities of Daily Living ?Home Assistive Devices/Equipment: Wheelchair, Environmental consultant (specify type) ?ADL Screening (condition at time of admission) ?Patient's cognitive ability adequate to safely complete daily activities?: Yes ?Is the patient deaf or have difficulty hearing?: No ?Does the patient have difficulty seeing, even when wearing glasses/contacts?: No ?Does the patient have difficulty concentrating, remembering, or making decisions?: No ?Patient able to express need for assistance with ADLs?: Yes ?Does the patient have difficulty dressing or bathing?: No ?Independently performs ADLs?: Yes (appropriate for developmental age) ?Does the patient have difficulty walking or climbing stairs?: Yes ?Weakness of Legs: Right ?Weakness of Arms/Hands: Right ? ?Permission Sought/Granted ?Permission sought to share information with : Case Manager ?Permission granted to share information with : Yes, Verbal Permission Granted ?   ? Permission granted to share info w AGENCY: Home health agency, DME agency ?   ?   ? ?Emotional Assessment ?Appearance:: Appears stated age ?Attitude/Demeanor/Rapport: Gracious, Engaged ?Affect (typically observed): Pleasant, Appropriate ?Orientation: : Oriented to Self, Oriented to Place, Oriented to  Time, Oriented to Situation ?Alcohol / Substance Use: Not Applicable ?Psych Involvement: No (comment) ? ?Admission diagnosis:  Muscle weakness of right upper extremity [M62.81] ?Right sided  weakness [R53.1] ?Weakness of right lower extremity [R29.898] ?Right sided numbness [R20.0] ?CVA (cerebral vascular accident) [facility]) [I63.9] ?Patient Active Problem List  ? Diagnosis Date Noted  ? CVA (cerebral vascular accident) ([place]) 12/28/2021  ? Hypokalemia  12/27/2021  ? Acute kidney injury superimposed on chronic kidney disease ([place]) 12/27/2021  ? Anemia 12/27/2021  ? Acute CVA (cerebrovascular accident) ([place]) 12/27/2021  ? Lobar pneumonia ([place]) 12/27/2021  ? Right sided weakness 12/26/2021  ? HFrEF (heart failure with reduced ejection fraction) ([place]) 10/31/2021  ? Angina of effort ([place]) 03/01/2016  ? Essential (primary) hypertension 06/24/2014  ? Type 2 diabetes mellitus without complications ([place]) 09/81/1914  ? ?PCP:  [facility] ?Pharmacy:   ?[facility] (N), [place] [facility] [place] ?[address] ([place]) [place] [address] ?Phone: [phone] Fax: [phone] ? ? ? ? ?Social Determinants of Health (SDOH) Interventions ?  ? ?Readmission Risk Interventions ? ?  12/29/2021  ?  2:33 PM  ?Readmission Risk Prevention Plan  ?Transportation Screening Complete  ?PCP or Specialist Appt within 5-7 Days Complete  ?Home Care Screening Complete  ?Medication Review (RN CM) Complete  ? ? ? ?

## 2021-12-30 DIAGNOSIS — I639 Cerebral infarction, unspecified: Secondary | ICD-10-CM | POA: Diagnosis not present

## 2021-12-30 DIAGNOSIS — M542 Cervicalgia: Secondary | ICD-10-CM

## 2021-12-30 DIAGNOSIS — J181 Lobar pneumonia, unspecified organism: Secondary | ICD-10-CM | POA: Diagnosis not present

## 2021-12-30 LAB — CBC
HCT: 29.9 % — ABNORMAL LOW (ref 36.0–46.0)
Hemoglobin: 9.3 g/dL — ABNORMAL LOW (ref 12.0–15.0)
MCH: 26.6 pg (ref 26.0–34.0)
MCHC: 31.1 g/dL (ref 30.0–36.0)
MCV: 85.7 fL (ref 80.0–100.0)
Platelets: 275 10*3/uL (ref 150–400)
RBC: 3.49 MIL/uL — ABNORMAL LOW (ref 3.87–5.11)
RDW: 14.4 % (ref 11.5–15.5)
WBC: 11.3 10*3/uL — ABNORMAL HIGH (ref 4.0–10.5)
nRBC: 0 % (ref 0.0–0.2)

## 2021-12-30 LAB — BASIC METABOLIC PANEL
Anion gap: 11 (ref 5–15)
BUN: 46 mg/dL — ABNORMAL HIGH (ref 8–23)
CO2: 19 mmol/L — ABNORMAL LOW (ref 22–32)
Calcium: 7.9 mg/dL — ABNORMAL LOW (ref 8.9–10.3)
Chloride: 108 mmol/L (ref 98–111)
Creatinine, Ser: 2.91 mg/dL — ABNORMAL HIGH (ref 0.44–1.00)
GFR, Estimated: 17 mL/min — ABNORMAL LOW (ref >60)
Glucose, Bld: 107 mg/dL — ABNORMAL HIGH (ref 70–99)
Potassium: 5.1 mmol/L (ref 3.5–5.1)
Sodium: 138 mmol/L (ref 135–145)

## 2021-12-30 LAB — GLUCOSE, CAPILLARY
Glucose-Capillary: 112 mg/dL — ABNORMAL HIGH (ref 70–99)
Glucose-Capillary: 97 mg/dL (ref 70–99)
Glucose-Capillary: 97 mg/dL (ref 70–99)
Glucose-Capillary: 216 mg/dL — ABNORMAL HIGH (ref 70–99)

## 2021-12-30 MED ORDER — GUAIFENESIN-DM 100-10 MG/5ML PO SYRP
5.0000 mL | ORAL_SOLUTION | ORAL | Status: DC | PRN
  Administered 2021-12-30: 5 mL via ORAL
  Filled 2021-12-30: qty 10

## 2021-12-30 MED ORDER — GABAPENTIN 300 MG PO CAPS
300.0000 mg | ORAL_CAPSULE | Freq: Every day | ORAL | Status: DC
  Administered 2021-12-30 – 2022-01-12 (×14): 300 mg via ORAL
  Filled 2021-12-30 (×15): qty 1

## 2021-12-30 MED ORDER — MORPHINE SULFATE (PF) 2 MG/ML IV SOLN
1.0000 mg | Freq: Once | INTRAVENOUS | Status: AC
  Administered 2021-12-30: 1 mg via INTRAVENOUS
  Filled 2021-12-30: qty 1

## 2021-12-30 MED ORDER — PREDNISONE 50 MG PO TABS
50.0000 mg | ORAL_TABLET | Freq: Two times a day (BID) | ORAL | Status: DC
  Administered 2021-12-30 – 2021-12-31 (×3): 50 mg via ORAL
  Filled 2021-12-30 (×3): qty 1

## 2021-12-30 MED ORDER — MORPHINE SULFATE (PF) 2 MG/ML IV SOLN
2.0000 mg | INTRAVENOUS | Status: DC | PRN
  Administered 2022-01-07: 2 mg via INTRAVENOUS
  Filled 2021-12-30: qty 1

## 2021-12-30 MED ORDER — OXYCODONE HCL 5 MG PO TABS
10.0000 mg | ORAL_TABLET | Freq: Four times a day (QID) | ORAL | Status: DC | PRN
Start: 2021-12-30 — End: 2022-01-09
  Administered 2021-12-30 – 2022-01-03 (×4): 10 mg via ORAL
  Filled 2021-12-30 (×4): qty 2

## 2021-12-30 NOTE — Progress Notes (Signed)
Patient had refused meds and accuchecks on day shift. Refused meds and accucheck on night shift. Patient has been irritable about the care frequency and education does not help alleviate her agitation. During the tech's last round of vitals, patient complained of lower back pain #8 that radiates to neck. Blood pressure and heart rate were elevated. Heart rate of 114 caused MEWS to turn yellow. Confirmed tachycardia by looking at her rhythm on monitor. Patient did not have pain med for pain rating of #8. Contacted the provider Sharion Settler and informed her of patient's refusing care, her BP, heart rate and pain complaint. Provider ordered morphine. Administered to patient. Patient allowed me to give her heparin shot but complained when lab came to draw blood. Reeducated patient and she then allowed lab to draw blood. Patient has been afebrile over the night. ?

## 2021-12-30 NOTE — Progress Notes (Signed)
Occupational Therapy Treatment ?Patient Details ?Name: Deanna [name] ?MRN: [id number] ?DOB: 1951-04-20 ?Today's Date: 12/30/2021 ? ? ?History of present illness 71 y.o. female with medical history significant of hypertension, CAD with recent stents presenting with right-sided weakness, worse since yesterday evening. 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 No  hemorrhage or mass effect. ?  ?OT comments ? Deanna [name] was seen for OT treatment on this date. Upon arrival to room pt seated on toilet initiating standing for pericare, cues to complete seated. CGA for toilet t/f - pt reaching out for furniture/wall support however denies need for RW demonstrating poor safety awareness. SBA hand washing standing sinkside. Pt completes vision screening with notable L vertical scanning deficits and poor insight. Reviewed bed level HEP with good carryover of technique. Pt making progress toward goals. Pt continues to benefit from skilled OT services to maximize return to PLOF and minimize risk of future falls, injury, caregiver burden, and readmission. Will continue to follow POC. Discharge recommendation remains appropriate.  ?  ? ?Recommendations for follow up therapy are one component of a multi-disciplinary discharge planning process, led by the attending physician.  Recommendations may be updated based on patient status, additional functional criteria and insurance authorization. ?   ?Follow Up Recommendations ? Home health OT  ?  ?Assistance Recommended at Discharge Frequent or constant Supervision/Assistance  ?Patient can return home with the following ? Help with stairs or ramp for entrance;Assistance with cooking/housework;A little help with walking and/or transfers;A little help with bathing/dressing/bathroom ?  ?Equipment Recommendations ? BSC/3in1  ?  ?Recommendations for Other Services   ? ?  ?Precautions /  Restrictions Precautions ?Precautions: Fall ?Restrictions ?Weight Bearing Restrictions: No  ? ? ?  ? ?Mobility Bed Mobility ?Overal bed mobility: Needs Assistance ?Bed Mobility: Sit to Supine ?  ?  ?  ?Sit to supine: Supervision ?  ?  ?  ? ?Transfers ?Overall transfer level: Needs assistance ?Equipment used: None ?Transfers: Sit to/from Stand ?Sit to Stand: Supervision ?  ?  ?  ?  ?  ?General transfer comment: use of grab bar ?  ?  ?Balance Overall balance assessment: Needs assistance ?Sitting-balance support: No upper extremity supported, Feet supported ?Sitting balance-[name]: Fair ?  ?  ?Standing balance support: No upper extremity supported, During functional activity ?Standing balance-[name]: Fair ?  ?  ?  ?  ?  ?  ?  ?  ?  ?  ?  ?  ?   ? ?ADL either performed or assessed with clinical judgement  ? ?ADL Overall ADL's : Needs assistance/impaired ?  ?  ?  ?  ?  ?  ?  ?  ?  ?  ?  ?  ?  ?  ?  ?  ?  ?  ?  ?General ADL Comments: CGA for toilet t/f, SUPERVISION perihygiene. SBA hand washing standing sinkside ?  ? ? ? ?Cognition Arousal/Alertness: Awake/alert ?Behavior During Therapy: [facility] for tasks assessed/performed ?Overall Cognitive Status: Within Functional Limits for tasks assessed ?  ?  ?  ?  ?  ?  ?  ?  ?  ?  ?  ?  ?  ?  ?  ?  ?General Comments: poor safety awareness ?  ?  ?   ? ?Pertinent Vitals/ Pain       Pain Assessment ?Pain Assessment: Faces ?Faces Pain Scale: Hurts a little bit ?Pain Location:  L knee with mobility ?Pain Descriptors / Indicators: Aching ?Pain Intervention(s): Limited activity within patient's tolerance, Repositioned ? ? ?Frequency ? Min 2X/week  ? ? ? ? ?  ?Progress Toward Goals ? ?OT Goals(current goals can now be found in the care plan section) ? Progress towards OT goals: Progressing toward goals ? ?Acute Rehab OT Goals ?Patient Stated Goal: to go home ?OT Goal Formulation: With patient ?Time For Goal Achievement: 01/10/22 ?Potential to Achieve Goals: Fair ?ADL Goals ?Pt Will  Perform Grooming: with modified independence;sitting ?Pt Will Perform Upper Body Dressing: with modified independence;sitting ?Pt Will Perform Lower Body Dressing: with mod assist;sitting/lateral leans  ?Plan Discharge plan remains appropriate;Frequency remains appropriate   ? ?Co-evaluation ? ? ?   ?  ?  ?  ?  ? ?  ?AM-PAC OT "6 Clicks" Daily Activity     ?Outcome Measure ? ? Help from another person eating meals?: None ?Help from another person taking care of personal grooming?: A Little ?Help from another person toileting, which includes using toliet, bedpan, or urinal?: A Little ?Help from another person bathing (including washing, rinsing, drying)?: A Little ?Help from another person to put on and taking off regular upper body clothing?: None ?Help from another person to put on and taking off regular lower body clothing?: A Little ?6 Click Score: 20 ? ?  ?End of Session   ? ?OT Visit Diagnosis: History of falling (Z91.81);Muscle weakness (generalized) (M62.81);Hemiplegia and hemiparesis ?Hemiplegia - Right/Left: Right ?Hemiplegia - dominant/non-dominant: Dominant ?Hemiplegia - caused by: Cerebral infarction ?  ?Activity Tolerance Patient limited by fatigue ?  ?Patient Left in bed;with call bell/phone within reach;with bed alarm set ?  ?Nurse Communication   ?  ? ?   ? ?Time: 9509-3267 ?OT Time Calculation (min): 9 min ? ?Charges: OT General Charges ?$OT Visit: 1 Visit ?OT Treatments ?$Self Care/Home Management : 8-22 mins ? ?Dessie Coma, M.S. OTR/L  ?12/30/21, 3:52 PM  ?ascom [phone] ? ?

## 2021-12-30 NOTE — Progress Notes (Signed)
PT Cancellation Note ? ?Patient Details ?Name: Deanna [name] ?MRN: [id number] ?DOB: [birth year] ? ? ?Cancelled Treatment:    Reason Eval/Treat Not Completed: Other (comment) ? ?Offered and encouraged x 2 today.  She refuses x 2 with non-specific complaints.  Explained risks/benefits of participation but she continues to decline.  Plan is to return home but she has yet to participate in session since evaluation on Tuesday.   ? ? ?Chesley Noon ?12/30/2021, 12:03 PM ?

## 2021-12-30 NOTE — Progress Notes (Signed)
Cross Cover ?Dose of morphine ordered for c/o 8/10 back pain that radiates to her neck accompanied by elevated blood pressure and heart rate ?

## 2021-12-30 NOTE — Care Management Important Message (Signed)
Important Message ? ?Patient Details  ?Name: Deanna [name] ?MRN: [id number] ?Date of Birth: October 05, 1950 ? ? ?Medicare Important Message Given:  Yes ? ? ? ? ?[name] A Shelva Hetzer ?12/30/2021, 9:54 AM ?

## 2021-12-30 NOTE — Progress Notes (Signed)
?PROGRESS NOTE ? ? ? ?Deanna [name]  DJS:[id number] DOB: 1951/02/13 DOA: 12/26/2021 ?PCP: [facility] ? ?Assessment & Plan: ?  ?Principal Problem: ?  Right sided weakness ?Active Problems: ?  Acute CVA (cerebrovascular accident) ([place]) ?  Acute kidney injury superimposed on chronic kidney disease ([place]) ?  Lobar pneumonia ([place]) ?  Essential (primary) hypertension ?  HFrEF (heart failure with reduced ejection fraction) ([place]) ?  Type 2 diabetes mellitus without complications ([place]) ?  Hypokalemia ?  Anemia ?  CVA (cerebral vascular accident) [facility]) ? ?  ?  ?Acute CVA: as per MRI. Continue on aspirin, plavix x 21 days and then just aspirin monotherapy as per neuro. Continue on statin  ?  ?Lobar pneumonia: possibly secondary to parainfluenza virus 3 & possible superimposed bacterial infection. Continue on IV rocephin, azithromycin, & bronchodilators.Influenza, COVID19 were both neg. Procal 0.43. Afebrile x 24 hrs.  ? 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 Will start steroids and start oxycodone as pain is increased today. F/u w/ neuro surg outpatient, Dr. Lacinda Axon  ?  ?AKI on CKDIV: baseline Cr 3.19. Cr is labile  ? ?ACD: likely secondary to CKD. Will transfuse if Hb < 7.0  ?  ?Hypokalemia: WNL today  ?  ?DM2: well controlled, HbA1c 5.5. Continue on SSI w/ accuchecks  ?  ?Chronic systolic CHF: appears euvolemic. Continue on metoprolol. Hold lasix. Monitor I/Os  ?  ?HTN: continue on metoprolol  ? ? ?DVT prophylaxis: SCDs ?Code Status: full  ?Family Communication: called pt's granddaughter, [name], but no answer & unable to leave a voicemail  ?Disposition Plan: d/c home w/ HH  ? ?Level of care: Telemetry Medical ? ?Status is: Inpatient ?Remains inpatient appropriate because: increased neck pain today, will likely d/c home tomorrow  ? ? ? ? ? ?Consultants:  ?Neuro ?Neuro surg  ? ?Procedures: ? ?Antimicrobials: azithromycin,  rocephin  ? ? ?Subjective: ?Pt c/o neck pain   ? ?Objective: ?Vitals:  ? 12/29/21 2005 12/30/21 8850 12/30/21 0723 12/30/21 0820  ?BP: 132/74 (!) 169/85 (!) 161/67 (!) 162/79  ?Pulse: 91 (!) 114 (!) 111 (!) 106  ?Resp: '18 18  16  '$ ?Temp: 98.2 ?F (36.8 ?C) 99.3 ?F (37.4 ?C) 98.9 ?F (37.2 ?C) 99.8 ?F (37.7 ?C)  ?TempSrc: Oral Oral Oral Oral  ?SpO2: 100% 99% 98% 100%  ?Weight:      ?Height:      ? ? ?Intake/Output Summary (Last 24 hours) at 12/30/2021 0927 ?Last data filed at 12/30/2021 0700 ?Gross per 24 hour  ?Intake 0 ml  ?Output --  ?Net 0 ml  ? ?Filed Weights  ? 12/27/21 1300  ?Weight: 67.8 kg  ? ? ?Examination: ? ?General exam: Appears calm but uncomfortable  ?Respiratory system: diminished breath sounds b/l ?Cardiovascular system: S1 & S2+. No rubs or clicks  ?Gastrointestinal system: Abd is soft, NT, ND & hypoactive bowel sounds  ?Central nervous system: alert and oriented. Moves all extremities  ?Psychiatry: judgement and insight appears normal. Flat mood and affect ? ? ? ?Data Reviewed: I have personally reviewed following labs and imaging studies ? ?CBC: ?Recent Labs  ?Lab 12/26/21 ?1621 12/27/21 ?2774 12/28/21 ?1287 12/29/21 ?8676 12/30/21 ?7209  ?WBC 9.6 12.3* 18.2* 17.0* 11.3*  ?NEUTROABS 6.9  --   --   --   --   ?HGB 10.2* 10.3* 9.1* 9.4* 9.3*  ?HCT 32.8* 32.7* 29.0* 30.4* 29.9*  ?MCV 85.2 84.9 84.5 85.4  85.7  ?PLT 234 231 233 253 275  ? ?Basic Metabolic Panel: ?Recent Labs  ?Lab 12/26/21 ?1621 12/27/21 ?1443 12/28/21 ?1540 12/29/21 ?0867 12/30/21 ?6195  ?NA 136  --  140 138 138  ?K 3.2*  --  4.3 4.3 5.1  ?CL 104  --  109 107 108  ?CO2 22  --  22 21* 19*  ?GLUCOSE 117*  --  140* 99 107*  ?BUN 47*  --  44* 45* 46*  ?CREATININE 3.84* 3.49* 3.19* 3.31* 2.91*  ?CALCIUM 7.4*  --  7.9* 7.8* 7.9*  ? ?GFR: ?Estimated Creatinine Clearance: 19.3 mL/min (A) (by C-G formula based on SCr of 2.91 mg/dL (H)). ?Liver Function Tests: ?Recent Labs  ?Lab 12/26/21 ?1621 12/28/21 ?0932  ?AST 13* 11*  ?ALT 8 6  ?ALKPHOS 116 95   ?BILITOT 0.4 0.3  ?PROT 7.6 6.7  ?ALBUMIN 3.2* 2.6*  ? ?No results for input(s): LIPASE, AMYLASE in the last 168 hours. ?No results for input(s): AMMONIA in the last 168 hours. ?Coagulation Profile: ?Recent Labs  ?Lab 12/26/21 ?1707  ?INR 1.0  ? ?Cardiac Enzymes: ?No results for input(s): CKTOTAL, CKMB, CKMBINDEX, TROPONINI in the last 168 hours. ?BNP (last 3 results) ?No results for input(s): PROBNP in the last 8760 hours. ?HbA1C: ?No results for input(s): HGBA1C in the last 72 hours. ? ?CBG: ?Recent Labs  ?Lab 12/28/21 ?6712 12/28/21 ?1204 12/28/21 ?1650 12/28/21 ?2109 12/30/21 ?4580  ?GLUCAP 92 131* 130* 137* 112*  ? ?Lipid Profile: ?No results for input(s): CHOL, HDL, LDLCALC, TRIG, CHOLHDL, LDLDIRECT in the last 72 hours. ? ?Thyroid Function Tests: ?No results for input(s): TSH, T4TOTAL, FREET4, T3FREE, THYROIDAB in the last 72 hours. ?Anemia Panel: ?Recent Labs  ?  12/28/21 ?0611  ?FERRITIN 117  ?TIBC 200*  ?IRON 14*  ? ?Sepsis Labs: ?Recent Labs  ?Lab 12/28/21 ?0611  ?PROCALCITON 0.43  ? ? ?Recent Results (from the past 240 hour(s))  ?Respiratory (~20 pathogens) panel by PCR     Status: Abnormal  ? Collection Time: 12/27/21 11:32 AM  ? Specimen: Nasopharyngeal Swab; Respiratory  ?Result Value Ref Range Status  ? Adenovirus NOT DETECTED NOT DETECTED Final  ? Coronavirus 229E NOT DETECTED NOT DETECTED Final  ?  Comment: (NOTE) ?The Coronavirus on the Respiratory Panel, DOES NOT test for the novel  ?Coronavirus (2019 nCoV) ?  ? Coronavirus HKU1 NOT DETECTED NOT DETECTED Final  ? Coronavirus NL63 NOT DETECTED NOT DETECTED Final  ? Coronavirus OC43 NOT DETECTED NOT DETECTED Final  ? Metapneumovirus NOT DETECTED NOT DETECTED Final  ? Rhinovirus / Enterovirus NOT DETECTED NOT DETECTED Final  ? Influenza A NOT DETECTED NOT DETECTED Final  ? Influenza B NOT DETECTED NOT DETECTED Final  ? Parainfluenza Virus 1 NOT DETECTED NOT DETECTED Final  ? Parainfluenza Virus 2 NOT DETECTED NOT DETECTED Final  ? Parainfluenza  Virus 3 DETECTED (A) NOT DETECTED Final  ? Parainfluenza Virus 4 NOT DETECTED NOT DETECTED Final  ? Respiratory Syncytial Virus NOT DETECTED NOT DETECTED Final  ? Bordetella pertussis NOT DETECTED NOT DETECTED Final  ? Bordetella Parapertussis NOT DETECTED NOT DETECTED Final  ? Chlamydophila pneumoniae NOT DETECTED NOT DETECTED Final  ? Mycoplasma pneumoniae NOT DETECTED NOT DETECTED Final  ?  Comment: Performed at [facility], [place] [address] [place], [place] [address]  ?Resp Panel by RT-PCR (Flu A&B, Covid) Nasopharyngeal Swab     Status: None  ? Collection Time: 12/27/21 11:32 AM  ? Specimen: Nasopharyngeal Swab; Nasopharyngeal(NP) swabs in vial transport  medium  ?Result Value Ref Range Status  ? SARS Coronavirus 2 by RT PCR NEGATIVE NEGATIVE Final  ?  Comment: (NOTE) ?SARS-CoV-2 target nucleic acids are NOT DETECTED. ? ?The SARS-CoV-2 RNA is generally detectable in upper respiratory ?specimens during the acute phase of infection. The lowest ?concentration of SARS-CoV-2 viral copies this assay can detect is ?138 copies/mL. A negative result does not preclude SARS-Cov-2 ?infection and should not be used as the sole basis for treatment or ?other patient management decisions. A negative result may occur with  ?improper specimen collection/handling, submission of specimen other ?than nasopharyngeal swab, presence of viral mutation(s) within the ?areas targeted by this assay, and inadequate number of viral ?copies(<138 copies/mL). A negative result must be combined with ?clinical observations, patient history, and epidemiological ?information. The expected result is Negative. ? ?Fact Sheet for Patients:  ?EntrepreneurPulse.com.au ? ?Fact Sheet for Healthcare Providers:  ?IncredibleEmployment.be ? ?This test is no t yet approved or cleared by the [place] FDA and  ?has been authorized for detection and/or diagnosis of SARS-CoV-2 by ?FDA under an Emergency Use Authorization  (EUA). This EUA will remain  ?in effect (meaning this test can be used) for the duration of the ?COVID-19 declaration under Section 564(b)(1) of the Act, 21 ?U.S.C.section 360bbb-3(b)(1), unless the autho

## 2021-12-30 NOTE — Plan of Care (Signed)
?  Problem: Clinical Measurements: ?Goal: Ability to maintain clinical measurements within normal limits will improve ?Outcome: Progressing ?Goal: Will remain free from infection ?Outcome: Progressing ?Goal: Diagnostic test results will improve ?Outcome: Progressing ?Goal: Respiratory complications will improve ?Outcome: Progressing ?Goal: Cardiovascular complication will be avoided ?Outcome: Progressing ?  ?Problem: Activity: ?Goal: Risk for activity intolerance will decrease ?Outcome: Progressing ?  ?Problem: Nutrition: ?Goal: Adequate nutrition will be maintained ?Outcome: Progressing ?  ?Problem: Elimination: ?Goal: Will not experience complications related to bowel motility ?Outcome: Progressing ?Goal: Will not experience complications related to urinary retention ?Outcome: Progressing ?  ?Problem: Pain Managment: ?Goal: General experience of comfort will improve ?Outcome: Progressing ?  ?Problem: Safety: ?Goal: Ability to remain free from injury will improve ?Outcome: Progressing ?  ?Problem: Skin Integrity: ?Goal: Risk for impaired skin integrity will decrease ?Outcome: Progressing ?  ?Problem: Education: ?Goal: Knowledge of disease or condition will improve ?Outcome: Progressing ?Goal: Knowledge of secondary prevention will improve (SELECT ALL) ?Outcome: Progressing ?Goal: Knowledge of patient specific risk factors will improve (INDIVIDUALIZE FOR PATIENT) ?Outcome: Progressing ?  ?

## 2021-12-30 NOTE — Plan of Care (Signed)
°  Problem: Education: °Goal: Knowledge of General Education information will improve °Description: Including pain rating scale, medication(s)/side effects and non-pharmacologic comfort measures °Outcome: Progressing °  °Problem: Health Behavior/Discharge Planning: °Goal: Ability to manage health-related needs will improve °Outcome: Progressing °  °Problem: Clinical Measurements: °Goal: Cardiovascular complication will be avoided °Outcome: Progressing °  °

## 2021-12-31 DIAGNOSIS — J181 Lobar pneumonia, unspecified organism: Secondary | ICD-10-CM | POA: Diagnosis not present

## 2021-12-31 DIAGNOSIS — E875 Hyperkalemia: Secondary | ICD-10-CM

## 2021-12-31 DIAGNOSIS — I639 Cerebral infarction, unspecified: Secondary | ICD-10-CM | POA: Diagnosis not present

## 2021-12-31 LAB — BASIC METABOLIC PANEL
Anion gap: 10 (ref 5–15)
BUN: 47 mg/dL — ABNORMAL HIGH (ref 8–23)
CO2: 21 mmol/L — ABNORMAL LOW (ref 22–32)
Calcium: 8.7 mg/dL — ABNORMAL LOW (ref 8.9–10.3)
Chloride: 100 mmol/L (ref 98–111)
Creatinine, Ser: 3.08 mg/dL — ABNORMAL HIGH (ref 0.44–1.00)
GFR, Estimated: 16 mL/min — ABNORMAL LOW (ref >60)
Glucose, Bld: 277 mg/dL — ABNORMAL HIGH (ref 70–99)
Potassium: 5.7 mmol/L — ABNORMAL HIGH (ref 3.5–5.1)
Sodium: 131 mmol/L — ABNORMAL LOW (ref 135–145)

## 2021-12-31 LAB — CBC
HCT: 29 % — ABNORMAL LOW (ref 36.0–46.0)
Hemoglobin: 9 g/dL — ABNORMAL LOW (ref 12.0–15.0)
MCH: 26.4 pg (ref 26.0–34.0)
MCHC: 31 g/dL (ref 30.0–36.0)
MCV: 85 fL (ref 80.0–100.0)
Platelets: 293 10*3/uL (ref 150–400)
RBC: 3.41 MIL/uL — ABNORMAL LOW (ref 3.87–5.11)
RDW: 14.1 % (ref 11.5–15.5)
WBC: 7.4 10*3/uL (ref 4.0–10.5)
nRBC: 0 % (ref 0.0–0.2)

## 2021-12-31 LAB — GLUCOSE, CAPILLARY
Glucose-Capillary: 220 mg/dL — ABNORMAL HIGH (ref 70–99)
Glucose-Capillary: 256 mg/dL — ABNORMAL HIGH (ref 70–99)
Glucose-Capillary: 374 mg/dL — ABNORMAL HIGH (ref 70–99)
Glucose-Capillary: 282 mg/dL — ABNORMAL HIGH (ref 70–99)

## 2021-12-31 LAB — POTASSIUM: Potassium: 5.5 mmol/L — ABNORMAL HIGH (ref 3.5–5.1)

## 2021-12-31 MED ORDER — FUROSEMIDE 10 MG/ML IJ SOLN
40.0000 mg | Freq: Once | INTRAMUSCULAR | Status: AC
Start: 2021-12-31 — End: 2021-12-31
  Administered 2021-12-31: 40 mg via INTRAVENOUS
  Filled 2021-12-31: qty 4

## 2021-12-31 MED ORDER — SODIUM ZIRCONIUM CYCLOSILICATE 10 G PO PACK
10.0000 g | PACK | Freq: Once | ORAL | Status: AC
  Administered 2021-12-31: 10 g via ORAL
  Filled 2021-12-31: qty 1

## 2021-12-31 NOTE — Plan of Care (Signed)
?  Problem: Education: ?Goal: Knowledge of General Education information will improve ?Description: Including pain rating scale, medication(s)/side effects and non-pharmacologic comfort measures ?Outcome: Progressing ?  ?Problem: Health Behavior/Discharge Planning: ?Goal: Ability to manage health-related needs will improve ?Outcome: Progressing ?  ?Problem: Clinical Measurements: ?Goal: Ability to maintain clinical measurements within normal limits will improve ?Outcome: Progressing ?Goal: Will remain free from infection ?Outcome: Progressing ?Goal: Diagnostic test results will improve ?Outcome: Progressing ?Goal: Respiratory complications will improve ?Outcome: Progressing ?Goal: Cardiovascular complication will be avoided ?Outcome: Progressing ?  ?Problem: Activity: ?Goal: Risk for activity intolerance will decrease ?Outcome: Progressing ?  ?Problem: Nutrition: ?Goal: Adequate nutrition will be maintained ?Outcome: Progressing ?  ?Problem: Coping: ?Goal: Level of anxiety will decrease ?Outcome: Progressing ?  ?Problem: Elimination: ?Goal: Will not experience complications related to bowel motility ?Outcome: Progressing ?Goal: Will not experience complications related to urinary retention ?Outcome: Progressing ?  ?Problem: Pain Managment: ?Goal: General experience of comfort will improve ?Outcome: Progressing ?  ?Problem: Safety: ?Goal: Ability to remain free from injury will improve ?Outcome: Progressing ?  ?Problem: Skin Integrity: ?Goal: Risk for impaired skin integrity will decrease ?Outcome: Progressing ?  ?Problem: Education: ?Goal: Knowledge of disease or condition will improve ?Outcome: Progressing ?Goal: Knowledge of secondary prevention will improve (SELECT ALL) ?Outcome: Progressing ?Goal: Knowledge of patient specific risk factors will improve (INDIVIDUALIZE FOR PATIENT) ?Outcome: Progressing ?  ?

## 2021-12-31 NOTE — Progress Notes (Signed)
?PROGRESS NOTE ? ? ? ?Deanna [name]  RWE:[id number] DOB: May 07, 1951 DOA: 12/26/2021 ?PCP: [facility] [place] ? ?Assessment & Plan: ?  ?Principal Problem: ?  Right sided weakness ?Active Problems: ?  Acute CVA (cerebrovascular accident) ([place]) ?  Acute kidney injury superimposed on chronic kidney disease ([place]) ?  Lobar pneumonia ([place]) ?  Essential (primary) hypertension ?  HFrEF (heart failure with reduced ejection fraction) ([place]) ?  Type 2 diabetes mellitus without complications ([place]) ?  Hypokalemia ?  Anemia ?  CVA (cerebral vascular accident) [facility]) ? ?  ?  ?Acute CVA: as per MRI. Continue on plavix, aspirin x 21 days and then just aspirin monotherapy as per neuro. Continue on statin  ? ?Lobar pneumonia: possibly secondary to parainfluenza virus 3 & possible superimposed bacterial infection. Continue on IV rocephin, bronchodilators. Influenza, COVID19 were both neg. Procal 0.43. Afebrile x 48 hrs. ? 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 Will continue on steroids and oxycodone prn. Neck pain has improved today. F/u w/ neuro surg outpatient, Dr. Lacinda Axon  ? ?Hyperkalemia: lokelma & lasix x1. Repeat potassium level ordered  ?  ?AKI on CKDIV: baseline Cr 3.19.  Cr is labile  ? ?ACD: likely secondary to CKD. H&H are labile  ?  ?DM2: HbA1c 5.5, well controlled. Continue on SSI w/ accuchecks. BS increased secondary to steroid use  ?  ?Chronic systolic CHF: appears euvolemic. Continue on metoprolol. Hold lasix. Monitor I/Os  ?  ?HTN: continue on metoprolol  ? ? ?DVT prophylaxis: heparin Sq ?Code Status: full  ?Family Communication: called pt's granddaughter again today, [name], but no answer & unable to leave a voicemail  ?Disposition Plan: d/c home w/ HH  ? ?Level of care: Telemetry Medical ? ?Status is: Inpatient ?Remains inpatient appropriate because: possibly d/c this afternoon if potassium level is back WNL   ? ? ?Consultants:  ?Neuro ?Neuro surg  ? ?Procedures: ? ?Antimicrobials: rocephin  ? ? ?Subjective: ?Pt c/o fatigue   ? ?Objective: ?Vitals:  ? 12/30/21 1614 12/30/21 2129 12/31/21 0426 12/31/21 0737  ?BP: (!) 162/83 (!) 158/84 (!) 156/67 (!) 159/81  ?Pulse: 83 95 80 87  ?Resp: '16 16 16 17  '$ ?Temp: 98.1 ?F (36.7 ?C) 98.2 ?F (36.8 ?C) 98.2 ?F (36.8 ?C)   ?TempSrc: Oral Oral    ?SpO2: 100% 99% 100% 97%  ?Weight:      ?Height:      ? ?No intake or output data in the 24 hours ending 12/31/21 0738 ? ?Filed Weights  ? 12/27/21 1300  ?Weight: 67.8 kg  ? ? ?Examination: ? ?General exam: Appears calm & comfortable  ?Respiratory system: decreased breath sounds b/l  ?Cardiovascular system: S1/S2+. No rubs or gallops  ?Gastrointestinal system: Abd is soft, NT, ND & hypoactive bowel sounds   ?Central nervous system: alert and oriented. Moves all extremities  ?Psychiatry: judgement and insight appears normal. Appropriate mood and affect ? ? ? ?Data Reviewed: I have personally reviewed following labs and imaging studies ? ?CBC: ?Recent Labs  ?Lab 12/26/21 ?1621 12/27/21 ?6195 12/28/21 ?0932 12/29/21 ?6712 12/30/21 ?4580 12/31/21 ?0500  ?WBC 9.6 12.3* 18.2* 17.0* 11.3* 7.4  ?NEUTROABS 6.9  --   --   --   --   --   ?HGB 10.2* 10.3* 9.1* 9.4* 9.3* 9.0*  ?HCT 32.8* 32.7* 29.0* 30.4* 29.9* 29.0*  ?MCV 85.2 84.9 84.5 85.4 85.7 85.0  ?PLT 234 231 233 253 275 293  ? ?  Basic Metabolic Panel: ?Recent Labs  ?Lab 12/26/21 ?1621 12/27/21 ?0932 12/28/21 ?3557 12/29/21 ?3220 12/30/21 ?2542 12/31/21 ?0500  ?NA 136  --  140 138 138 131*  ?K 3.2*  --  4.3 4.3 5.1 5.7*  ?CL 104  --  109 107 108 100  ?CO2 22  --  22 21* 19* 21*  ?GLUCOSE 117*  --  140* 99 107* 277*  ?BUN 47*  --  44* 45* 46* 47*  ?CREATININE 3.84* 3.49* 3.19* 3.31* 2.91* 3.08*  ?CALCIUM 7.4*  --  7.9* 7.8* 7.9* 8.7*  ? ?GFR: ?Estimated Creatinine Clearance: 18.2 mL/min (A) (by C-G formula based on SCr of 3.08 mg/dL (H)). ?Liver Function Tests: ?Recent Labs  ?Lab 12/26/21 ?1621  12/28/21 ?7062  ?AST 13* 11*  ?ALT 8 6  ?ALKPHOS 116 95  ?BILITOT 0.4 0.3  ?PROT 7.6 6.7  ?ALBUMIN 3.2* 2.6*  ? ?No results for input(s): LIPASE, AMYLASE in the last 168 hours. ?No results for input(s): AMMONIA in the last 168 hours. ?Coagulation Profile: ?Recent Labs  ?Lab 12/26/21 ?1707  ?INR 1.0  ? ?Cardiac Enzymes: ?No results for input(s): CKTOTAL, CKMB, CKMBINDEX, TROPONINI in the last 168 hours. ?BNP (last 3 results) ?No results for input(s): PROBNP in the last 8760 hours. ?HbA1C: ?No results for input(s): HGBA1C in the last 72 hours. ? ?CBG: ?Recent Labs  ?Lab 12/30/21 ?0822 12/30/21 ?1135 12/30/21 ?1613 12/30/21 ?2138 12/31/21 ?0730  ?GLUCAP 112* 97 97 216* 220*  ? ?Lipid Profile: ?No results for input(s): CHOL, HDL, LDLCALC, TRIG, CHOLHDL, LDLDIRECT in the last 72 hours. ? ?Thyroid Function Tests: ?No results for input(s): TSH, T4TOTAL, FREET4, T3FREE, THYROIDAB in the last 72 hours. ?Anemia Panel: ?No results for input(s): VITAMINB12, FOLATE, FERRITIN, TIBC, IRON, RETICCTPCT in the last 72 hours. ? ?Sepsis Labs: ?Recent Labs  ?Lab 12/28/21 ?0611  ?PROCALCITON 0.43  ? ? ?Recent Results (from the past 240 hour(s))  ?Respiratory (~20 pathogens) panel by PCR     Status: Abnormal  ? Collection Time: 12/27/21 11:32 AM  ? Specimen: Nasopharyngeal Swab; Respiratory  ?Result Value Ref Range Status  ? Adenovirus NOT DETECTED NOT DETECTED Final  ? Coronavirus 229E NOT DETECTED NOT DETECTED Final  ?  Comment: (NOTE) ?The Coronavirus on the Respiratory Panel, DOES NOT test for the novel  ?Coronavirus (2019 nCoV) ?  ? Coronavirus HKU1 NOT DETECTED NOT DETECTED Final  ? Coronavirus NL63 NOT DETECTED NOT DETECTED Final  ? Coronavirus OC43 NOT DETECTED NOT DETECTED Final  ? Metapneumovirus NOT DETECTED NOT DETECTED Final  ? Rhinovirus / Enterovirus NOT DETECTED NOT DETECTED Final  ? Influenza A NOT DETECTED NOT DETECTED Final  ? Influenza B NOT DETECTED NOT DETECTED Final  ? Parainfluenza Virus 1 NOT DETECTED NOT DETECTED  Final  ? Parainfluenza Virus 2 NOT DETECTED NOT DETECTED Final  ? Parainfluenza Virus 3 DETECTED (A) NOT DETECTED Final  ? Parainfluenza Virus 4 NOT DETECTED NOT DETECTED Final  ? Respiratory Syncytial Virus NOT DETECTED NOT DETECTED Final  ? Bordetella pertussis NOT DETECTED NOT DETECTED Final  ? Bordetella Parapertussis NOT DETECTED NOT DETECTED Final  ? Chlamydophila pneumoniae NOT DETECTED NOT DETECTED Final  ? Mycoplasma pneumoniae NOT DETECTED NOT DETECTED Final  ?  Comment: Performed at [facility], [place] [address] [place], [place] [address]  ?Resp Panel by RT-PCR (Flu A&B, Covid) Nasopharyngeal Swab     Status: None  ? Collection Time: 12/27/21 11:32 AM  ? Specimen: Nasopharyngeal Swab; Nasopharyngeal(NP) swabs in vial transport medium  ?  Result Value Ref Range Status  ? SARS Coronavirus 2 by RT PCR NEGATIVE NEGATIVE Final  ?  Comment: (NOTE) ?SARS-CoV-2 target nucleic acids are NOT DETECTED. ? ?The SARS-CoV-2 RNA is generally detectable in upper respiratory ?specimens during the acute phase of infection. The lowest ?concentration of SARS-CoV-2 viral copies this assay can detect is ?138 copies/mL. A negative result does not preclude SARS-Cov-2 ?infection and should not be used as the sole basis for treatment or ?other patient management decisions. A negative result may occur with  ?improper specimen collection/handling, submission of specimen other ?than nasopharyngeal swab, presence of viral mutation(s) within the ?areas targeted by this assay, and inadequate number of viral ?copies(<138 copies/mL). A negative result must be combined with ?clinical observations, patient history, and epidemiological ?information. The expected result is Negative. ? ?Fact Sheet for Patients:  ?EntrepreneurPulse.com.au ? ?Fact Sheet for Healthcare Providers:  ?IncredibleEmployment.be ? ?This test is no t yet approved or cleared by the [place] FDA and  ?has been authorized for  detection and/or diagnosis of SARS-CoV-2 by ?FDA under an Emergency Use Authorization (EUA). This EUA will remain  ?in effect (meaning this test can be used) for the duration of the ?COVID-19 declaration under Section 564(b)(

## 2021-12-31 NOTE — Progress Notes (Signed)
?   12/30/21 0519  ?Assess: MEWS Score  ?Temp 99.3 ?F (37.4 ?C)  ?BP (!) 169/85  ?Pulse Rate (!) 114  ?Resp 18  ?SpO2 99 %  ?Assess: MEWS Score  ?MEWS Temp 0  ?MEWS Systolic 0  ?MEWS Pulse 2  ?MEWS RR 0  ?MEWS LOC 0  ?MEWS Score 2  ?MEWS Score Color Yellow  ?Assess: if the MEWS score is Yellow or Red  ?Were vital signs taken at a resting state? Yes  ?Focused Assessment No change from prior assessment  ?Does the patient meet 2 or more of the SIRS criteria? No  ?MEWS guidelines implemented *See Row Information* Yes  ?Treat  ?MEWS Interventions Administered prn meds/treatments  ?Pain Scale 0-10  ?Pain Score 8  ?Pain Type Acute pain  ?Pain Location Back  ?Pain Orientation Lower  ?Pain Radiating Towards Neck  ?Pain Descriptors / Indicators Sharp  ?Pain Onset Awakened from sleep  ?Patients Stated Pain Goal 0  ?Pain Intervention(s) MD notified (Comment) ?(awaiting response)  ?Take Vital Signs  ?Increase Vital Sign Frequency  Yellow: Q 2hr X 2 then Q 4hr X 2, if remains yellow, continue Q 4hrs  ?Escalate  ?MEWS: Escalate Yellow: discuss with charge nurse/RN and consider discussing with provider and RRT  ?Notify: Charge Nurse/RN  ?Name of Charge Nurse/RN Notified Kennyth Lose, RN  ?Date Charge Nurse/RN Notified 12/30/21  ?Time Charge Nurse/RN Notified [phone]  ?Notify: Provider  ?Provider Name/Title Sharion Settler, NP  ?Date Provider Notified 12/30/21  ?Time Provider Notified 0530  ?Notification Type Page ?(MESSAGE)  ?Notification Reason Other (Comment) ?(Yellow MEWS)  ?Provider response See new orders ?(MORPHINE ORDERED)  ?Date of Provider Response 12/30/21  ?Time of Provider Response [phone]  ?Document  ?Progress note created (see row info) Yes ?(SEE NOTE)  ?Assess: SIRS CRITERIA  ?SIRS Temperature  0  ?SIRS Pulse 1  ?SIRS Respirations  0  ?SIRS WBC 0  ?SIRS Score Sum  1  ? ? ?

## 2022-01-01 ENCOUNTER — Inpatient Hospital Stay: Payer: Medicare HMO

## 2022-01-01 DIAGNOSIS — E875 Hyperkalemia: Secondary | ICD-10-CM | POA: Diagnosis not present

## 2022-01-01 DIAGNOSIS — I639 Cerebral infarction, unspecified: Secondary | ICD-10-CM | POA: Diagnosis not present

## 2022-01-01 DIAGNOSIS — J181 Lobar pneumonia, unspecified organism: Secondary | ICD-10-CM | POA: Diagnosis not present

## 2022-01-01 LAB — BASIC METABOLIC PANEL
Anion gap: 9 (ref 5–15)
Anion gap: 8 (ref 5–15)
BUN: 58 mg/dL — ABNORMAL HIGH (ref 8–23)
BUN: 60 mg/dL — ABNORMAL HIGH (ref 8–23)
CO2: 22 mmol/L (ref 22–32)
CO2: 22 mmol/L (ref 22–32)
Calcium: 8.3 mg/dL — ABNORMAL LOW (ref 8.9–10.3)
Calcium: 8.4 mg/dL — ABNORMAL LOW (ref 8.9–10.3)
Chloride: 100 mmol/L (ref 98–111)
Chloride: 100 mmol/L (ref 98–111)
Creatinine, Ser: 3.16 mg/dL — ABNORMAL HIGH (ref 0.44–1.00)
Creatinine, Ser: 3.26 mg/dL — ABNORMAL HIGH (ref 0.44–1.00)
GFR, Estimated: 15 mL/min — ABNORMAL LOW (ref >60)
GFR, Estimated: 15 mL/min — ABNORMAL LOW (ref >60)
Glucose, Bld: 352 mg/dL — ABNORMAL HIGH (ref 70–99)
Glucose, Bld: 361 mg/dL — ABNORMAL HIGH (ref 70–99)
Potassium: 6.4 mmol/L (ref 3.5–5.1)
Potassium: 6.5 mmol/L (ref 3.5–5.1)
Sodium: 131 mmol/L — ABNORMAL LOW (ref 135–145)
Sodium: 130 mmol/L — ABNORMAL LOW (ref 135–145)

## 2022-01-01 LAB — GLUCOSE, CAPILLARY
Glucose-Capillary: 311 mg/dL — ABNORMAL HIGH (ref 70–99)
Glucose-Capillary: 305 mg/dL — ABNORMAL HIGH (ref 70–99)
Glucose-Capillary: 307 mg/dL — ABNORMAL HIGH (ref 70–99)
Glucose-Capillary: 378 mg/dL — ABNORMAL HIGH (ref 70–99)
Glucose-Capillary: 396 mg/dL — ABNORMAL HIGH (ref 70–99)
Glucose-Capillary: 446 mg/dL — ABNORMAL HIGH (ref 70–99)

## 2022-01-01 LAB — FOLATE: Folate: 5.4 ng/mL — ABNORMAL LOW (ref >5.9)

## 2022-01-01 LAB — CBC
HCT: 29.2 % — ABNORMAL LOW (ref 36.0–46.0)
Hemoglobin: 9 g/dL — ABNORMAL LOW (ref 12.0–15.0)
MCH: 25.9 pg — ABNORMAL LOW (ref 26.0–34.0)
MCHC: 30.8 g/dL (ref 30.0–36.0)
MCV: 84.1 fL (ref 80.0–100.0)
Platelets: 333 10*3/uL (ref 150–400)
RBC: 3.47 MIL/uL — ABNORMAL LOW (ref 3.87–5.11)
RDW: 13.9 % (ref 11.5–15.5)
WBC: 10.4 10*3/uL (ref 4.0–10.5)
nRBC: 0 % (ref 0.0–0.2)

## 2022-01-01 LAB — IRON AND TIBC
Iron: 79 ug/dL (ref 28–170)
Saturation Ratios: 33 % — ABNORMAL HIGH (ref 10.4–31.8)
TIBC: 239 ug/dL — ABNORMAL LOW (ref 250–450)
UIBC: 160 ug/dL

## 2022-01-01 LAB — VITAMIN B12: Vitamin B-12: 522 pg/mL (ref 180–914)

## 2022-01-01 LAB — POTASSIUM
Potassium: 5.6 mmol/L — ABNORMAL HIGH (ref 3.5–5.1)
Potassium: 5.5 mmol/L — ABNORMAL HIGH (ref 3.5–5.1)
Potassium: 6.3 mmol/L (ref 3.5–5.1)

## 2022-01-01 LAB — FERRITIN: Ferritin: 143 ng/mL (ref 11–307)

## 2022-01-01 MED ORDER — INSULIN ASPART 100 UNIT/ML IV SOLN
10.0000 [IU] | Freq: Once | INTRAVENOUS | Status: AC
  Administered 2022-01-01: 10 [IU] via INTRAVENOUS
  Filled 2022-01-01: qty 0.1

## 2022-01-01 MED ORDER — SODIUM ZIRCONIUM CYCLOSILICATE 10 G PO PACK
10.0000 g | PACK | Freq: Three times a day (TID) | ORAL | Status: DC
Start: 2022-01-01 — End: 2022-01-02
  Administered 2022-01-01 – 2022-01-02 (×2): 10 g via ORAL
  Filled 2022-01-01 (×3): qty 1

## 2022-01-01 MED ORDER — PREDNISONE 20 MG PO TABS: 40.0000 mg | ORAL_TABLET | Freq: Two times a day (BID) | ORAL | Status: DC

## 2022-01-01 MED ORDER — INSULIN ASPART 100 UNIT/ML IJ SOLN
15.0000 [IU] | Freq: Once | INTRAMUSCULAR | Status: AC
  Administered 2022-01-01: 15 [IU] via SUBCUTANEOUS
  Filled 2022-01-01: qty 1

## 2022-01-01 MED ORDER — SODIUM BICARBONATE 8.4 % IV SOLN
50.0000 meq | Freq: Once | INTRAVENOUS | Status: AC
  Administered 2022-01-01: 50 meq via INTRAVENOUS
  Filled 2022-01-01: qty 50

## 2022-01-01 MED ORDER — INSULIN ASPART 100 UNIT/ML IJ SOLN: 0.0000 [IU] | Freq: Three times a day (TID) | INTRAMUSCULAR | Status: DC

## 2022-01-01 MED ORDER — PREDNISONE 20 MG PO TABS
40.0000 mg | ORAL_TABLET | Freq: Two times a day (BID) | ORAL | Status: DC
  Administered 2022-01-01 (×2): 40 mg via ORAL
  Filled 2022-01-01 (×2): qty 2

## 2022-01-01 MED ORDER — SODIUM ZIRCONIUM CYCLOSILICATE 10 G PO PACK
10.0000 g | PACK | Freq: Two times a day (BID) | ORAL | Status: DC
  Administered 2022-01-01: 10 g via ORAL
  Filled 2022-01-01 (×2): qty 1

## 2022-01-01 NOTE — Progress Notes (Signed)
Critical potassium of 6.4. Hassan Rowan, NP, notified.  ?Orders placed by provider ?Continuous pulse ox placed and EKG completed prior to shift change. Telemetry continued.  ?Remainder of orders will be carried out by next shift, handoff completed ?

## 2022-01-01 NOTE — Progress Notes (Signed)
?PROGRESS NOTE ? ? ? ?Deanna [name]  ZOX:[id number] DOB: 07/09/1951 DOA: 12/26/2021 ?PCP: [facility] [place] ? ?Assessment & Plan: ?  ?Principal Problem: ?  Right sided weakness ?Active Problems: ?  Acute CVA (cerebrovascular accident) ([name]) ?  Acute kidney injury superimposed on chronic kidney disease ([name]) ?  Lobar pneumonia ([name]) ?  Essential (primary) hypertension ?  HFrEF (heart failure with reduced ejection fraction) ([place]) ?  Type 2 diabetes mellitus without complications ([place]) ?  Hypokalemia ?  Anemia ?  CVA (cerebral vascular accident) [facility]) ? ?  ?  ?Acute CVA: as per MRI. Continue on statin. Continue on plavix, aspirin x 21 days & then just aspirin monotherapy as per neuro.  ? ?Lobar pneumonia: possibly secondary to parainfluenza virus 3 & possible superimposed bacterial infection. Completed abx course. Influenza, COVID19 were both neg. Procal 0.43. Afebrile > 48 hrs  ? 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 Start steroid taper and continue on oxycodone prn. Back pain is still present but neck pain has improved. F/u w/ neuro surg outpatient, Dr. Lacinda Axon  ? ?Hyperkalemia: trending up. Continue on lokelma. S/p insulin x1. Repeat potassium ordered  ?  ?AKI on CKDIV: baseline Cr 3.19. Cr is trending up from day prior. Nephro consult ? ?ACD: likely secondary to CKD. H&H are labile  ?  ?DM2: HbA1c 5.5, well controlled. Continue on SSI w/ accuchecks. BS increased secondary to steroid use  ?  ?Chronic systolic CHF: appears euvolemic. Continue on metoprolol. Hold lasix. Monitor I/Os  ?  ?HTN: continue on BB ? ? ?DVT prophylaxis: heparin Sq ?Code Status: full  ?Family Communication: discussed pt's care w/ pt's granddaughter, [name], and answered her questions  ?Disposition Plan: d/c home w/ HH  ? ?Level of care: Telemetry Medical ? ?Status is: Inpatient ?Remains inpatient appropriate because: hyperkalemia   ? ? ?Consultants:  ?Neuro ?Neuro surg  ?Nephro  ? ?Procedures: ? ?Antimicrobials:  ? ? ?Subjective: ?Pt c/o back pain   ? ?Objective: ?Vitals:  ? 12/31/21 1614 12/31/21 2049 01/01/22 0115 01/01/22 0535  ?BP: (!) 166/79 (!) 187/95 (!) 187/90 (!) 159/75  ?Pulse: 84 86 89 73  ?Resp: '17 16 16 16  '$ ?Temp: 98.2 ?F (36.8 ?C) 97.9 ?F (36.6 ?C) 98.2 ?F (36.8 ?C) 97.7 ?F (36.5 ?C)  ?TempSrc:      ?SpO2: 100% 100% 99% 98%  ?Weight:      ?Height:      ? ? ?Intake/Output Summary (Last 24 hours) at 01/01/2022 0726 ?Last data filed at 12/31/2021 2149 ?Gross per 24 hour  ?Intake 840 ml  ?Output --  ?Net 840 ml  ? ? ?Filed Weights  ? 12/27/21 1300  ?Weight: 67.8 kg  ? ? ?Examination: ? ?General exam: Appears comfortable   ?Respiratory system: clear breath sounds b/l  ?Cardiovascular system: S1 & S2+. No rubs or clicks   ?Gastrointestinal system: Abd is soft, NT, ND & normal bowel sounds  ?Central nervous system: alert and oriented. Moves all extremities  ?Psychiatry: judgement and insight appears normal. Appropriate mood and affect ? ? ? ?Data Reviewed: I have personally reviewed following labs and imaging studies ? ?CBC: ?Recent Labs  ?Lab 12/26/21 ?1621 12/27/21 ?8119 12/28/21 ?1478 12/29/21 ?2956 12/30/21 ?2130 12/31/21 ?0500 01/01/22 ?0446  ?WBC 9.6   < > 18.2* 17.0* 11.3* 7.4 10.4  ?NEUTROABS 6.9  --   --   --   --   --   --   ?  HGB 10.2*   < > 9.1* 9.4* 9.3* 9.0* 9.0*  ?HCT 32.8*   < > 29.0* 30.4* 29.9* 29.0* 29.2*  ?MCV 85.2   < > 84.5 85.4 85.7 85.0 84.1  ?PLT 234   < > 233 253 275 293 333  ? < > = values in this interval not displayed.  ? ?Basic Metabolic Panel: ?Recent Labs  ?Lab 12/28/21 ?0611 12/29/21 ?0938 12/30/21 ?1829 12/31/21 ?0500 12/31/21 ?1301 01/01/22 ?0446  ?NA 140 138 138 131*  --  131*  ?K 4.3 4.3 5.1 5.7* 5.5* 6.4*  ?CL 109 107 108 100  --  100  ?CO2 22 21* 19* 21*  --  22  ?GLUCOSE 140* 99 107* 277*  --  352*  ?BUN 44* 45* 46* 47*  --  58*  ?CREATININE 3.19* 3.31* 2.91* 3.08*  --  3.16*  ?CALCIUM 7.9* 7.8* 7.9*  8.7*  --  8.3*  ? ?GFR: ?Estimated Creatinine Clearance: 17.7 mL/min (A) (by C-G formula based on SCr of 3.16 mg/dL (H)). ?Liver Function Tests: ?Recent Labs  ?Lab 12/26/21 ?1621 12/28/21 ?9371  ?AST 13* 11*  ?ALT 8 6  ?ALKPHOS 116 95  ?BILITOT 0.4 0.3  ?PROT 7.6 6.7  ?ALBUMIN 3.2* 2.6*  ? ?No results for input(s): LIPASE, AMYLASE in the last 168 hours. ?No results for input(s): AMMONIA in the last 168 hours. ?Coagulation Profile: ?Recent Labs  ?Lab 12/26/21 ?1707  ?INR 1.0  ? ?Cardiac Enzymes: ?No results for input(s): CKTOTAL, CKMB, CKMBINDEX, TROPONINI in the last 168 hours. ?BNP (last 3 results) ?No results for input(s): PROBNP in the last 8760 hours. ?HbA1C: ?No results for input(s): HGBA1C in the last 72 hours. ? ?CBG: ?Recent Labs  ?Lab 12/30/21 ?2138 12/31/21 ?0730 12/31/21 ?1202 12/31/21 ?1611 12/31/21 ?2148  ?GLUCAP 216* 220* 256* 374* 282*  ? ?Lipid Profile: ?No results for input(s): CHOL, HDL, LDLCALC, TRIG, CHOLHDL, LDLDIRECT in the last 72 hours. ? ?Thyroid Function Tests: ?No results for input(s): TSH, T4TOTAL, FREET4, T3FREE, THYROIDAB in the last 72 hours. ?Anemia Panel: ?No results for input(s): VITAMINB12, FOLATE, FERRITIN, TIBC, IRON, RETICCTPCT in the last 72 hours. ? ?Sepsis Labs: ?Recent Labs  ?Lab 12/28/21 ?0611  ?PROCALCITON 0.43  ? ? ?Recent Results (from the past 240 hour(s))  ?Respiratory (~20 pathogens) panel by PCR     Status: Abnormal  ? Collection Time: 12/27/21 11:32 AM  ? Specimen: Nasopharyngeal Swab; Respiratory  ?Result Value Ref Range Status  ? Adenovirus NOT DETECTED NOT DETECTED Final  ? Coronavirus 229E NOT DETECTED NOT DETECTED Final  ?  Comment: (NOTE) ?The Coronavirus on the Respiratory Panel, DOES NOT test for the novel  ?Coronavirus (2019 nCoV) ?  ? Coronavirus HKU1 NOT DETECTED NOT DETECTED Final  ? Coronavirus NL63 NOT DETECTED NOT DETECTED Final  ? Coronavirus OC43 NOT DETECTED NOT DETECTED Final  ? Metapneumovirus NOT DETECTED NOT DETECTED Final  ? Rhinovirus /  Enterovirus NOT DETECTED NOT DETECTED Final  ? Influenza A NOT DETECTED NOT DETECTED Final  ? Influenza B NOT DETECTED NOT DETECTED Final  ? Parainfluenza Virus 1 NOT DETECTED NOT DETECTED Final  ? Parainfluenza Virus 2 NOT DETECTED NOT DETECTED Final  ? Parainfluenza Virus 3 DETECTED (A) NOT DETECTED Final  ? Parainfluenza Virus 4 NOT DETECTED NOT DETECTED Final  ? Respiratory Syncytial Virus NOT DETECTED NOT DETECTED Final  ? Bordetella pertussis NOT DETECTED NOT DETECTED Final  ? Bordetella Parapertussis NOT DETECTED NOT DETECTED Final  ? Chlamydophila pneumoniae NOT DETECTED NOT DETECTED Final  ?  Mycoplasma pneumoniae NOT DETECTED NOT DETECTED Final  ?  Comment: Performed at [facility], [place] [address] [place], [place] [address]  ?Resp Panel by RT-PCR (Flu A&B, Covid) Nasopharyngeal Swab     Status: None  ? Collection Time: 12/27/21 11:32 AM  ? Specimen: Nasopharyngeal Swab; Nasopharyngeal(NP) swabs in vial transport medium  ?Result Value Ref Range Status  ? SARS Coronavirus 2 by RT PCR NEGATIVE NEGATIVE Final  ?  Comment: (NOTE) ?SARS-CoV-2 target nucleic acids are NOT DETECTED. ? ?The SARS-CoV-2 RNA is generally detectable in upper respiratory ?specimens during the acute phase of infection. The lowest ?concentration of SARS-CoV-2 viral copies this assay can detect is ?138 copies/mL. A negative result does not preclude SARS-Cov-2 ?infection and should not be used as the sole basis for treatment or ?other patient management decisions. A negative result may occur with  ?improper specimen collection/handling, submission of specimen other ?than nasopharyngeal swab, presence of viral mutation(s) within the ?areas targeted by this assay, and inadequate number of viral ?copies(<138 copies/mL). A negative result must be combined with ?clinical observations, patient history, and epidemiological ?information. The expected result is Negative. ? ?Fact Sheet for Patients:   ?EntrepreneurPulse.com.au ? ?Fact Sheet for Healthcare Providers:  ?IncredibleEmployment.be ? ?This test is no t yet approved or cleared by the [place] FDA and  ?has been authorized for detection and/or diagnosis of SARS-

## 2022-01-01 NOTE — Progress Notes (Signed)
Date and time results received: 01/01/22 0735 ? ? ?Test: Potassium level ?Critical Value: 6.5 ? ?Name of Provider Notified:Dr. Jimmye Norman ? ?Orders Received? Or Actions Taken?:Will administer Insulin aspart 10units IV and Lokelma.  ?

## 2022-01-01 NOTE — Progress Notes (Signed)
Critical value for serum Potassium 6.3. Dr. Jimmye Norman and Dr. Theador Hawthorne has been notified. Order received for Lokelma TID.  ?

## 2022-01-01 NOTE — Consult Note (Signed)
Deanna [name] MRN: [id number] DOB/AGE: 70-30-1952 70 y.o. Primary Care Physician:[facility] Admit date: 12/26/2021 Chief Complaint:  Chief Complaint  Patient presents with   Numbness   HPI: Patient is a 71 year old African-American female with a past medical history of coronary artery disease, status post PCI, CHF with reduced ejection fraction, CKD, diabetes mellitus, hypertension who was brought to the ER with chief complaint of numbness.  History of present illness dates back to April 3 when patient was brought to the ER with chief complaint of weakness in the right arm Upon evaluation in the ER patient was found to have creatinine of 3.8, hemoglobin of 10.0 and was admitted with right-sided weakness  Neurology was consulted.  Patient was thought to have acute CVA but neurology also requested for MRI of C-spine and T-spine Patient was also found to have severe neuro foraminal stenosis  Patient will also found to have lobar pneumonia secondary to parainfluenza virus 3 and possible superimposed bacterial infection. Patient was high to clinic at the time of admission and patient potassium was repleted Patient is now hyperkalemic and nephrology was consulted.   Patient main complaint in today visit was that she was supposed to be discharged few days back but she is still in the hospital.Patient next major concern was steroids are making her sugar go high.   Patient offers no complaint of chest pain No complaint of palpitations No complaint of fever cough or chills    Past Medical History:  Diagnosis Date   Coronary artery disease    Hypertension         History reviewed. No pertinent family history. Family history-no family history of ESRD   Social History:  reports that she has never smoked. She does not have any smokeless tobacco history on file. She reports that she does not drink alcohol and does not use drugs.   Allergies:  Allergies   Allergen Reactions   Penicillins Rash    Medications Prior to Admission  Medication Sig Dispense Refill   aspirin EC 81 MG tablet Take 81 mg by mouth daily.     atorvastatin (LIPITOR) 40 MG tablet Take 40 mg by mouth daily.     gabapentin (NEURONTIN) 100 MG capsule Take 300 mg by mouth.     insulin glargine (LANTUS) 100 UNIT/ML injection Inject 65 Units into the skin at bedtime.     lisinopril-hydrochlorothiazide (PRINZIDE,ZESTORETIC) 20-12.5 MG tablet Take 1 tablet by mouth daily.     carbamide peroxide (DEBROX) 6.5 % OTIC solution Place 5 drops into the right ear 2 (two) times daily. (Patient not taking: Reported on 12/26/2021) 15 mL 1   fluticasone (FLONASE) 50 MCG/ACT nasal spray Place 1 spray into both nostrils 2 (two) times daily. (Patient not taking: Reported on 12/26/2021) 16 g 0   insulin aspart protamine- aspart (NOVOLOG MIX 70/30) (70-30) 100 UNIT/ML injection Inject into the skin. (Patient not taking: Reported on 12/26/2021)     metFORMIN (GLUCOPHAGE) 500 MG tablet Take 500 mg by mouth 2 (two) times daily with a meal. (Patient not taking: Reported on 12/26/2021)     metoprolol succinate (TOPROL-XL) 100 MG 24 hr tablet Take 100 mg by mouth daily. Take with or immediately following a meal. (Patient not taking: Reported on 12/26/2021)         MVH:QIONG from the symptoms mentioned above,there are no other symptoms referable to all systems reviewed.    stroke: mapping our early stages of recovery book   Does not apply  Once   aspirin EC  81 mg Oral Daily   atorvastatin  80 mg Oral QPM   clopidogrel  75 mg Oral Daily   feeding supplement  237 mL Oral BID BM   gabapentin  300 mg Oral QHS   heparin  5,000 Units Subcutaneous Q8H   insulin aspart  0-5 Units Subcutaneous QHS   insulin aspart  0-6 Units Subcutaneous TID WC   metoprolol succinate  25 mg Oral QHS   multivitamin with minerals  1 tablet Oral Daily   predniSONE  50 mg Oral BID WC   sodium zirconium cyclosilicate  10 g Oral BID        Physical Exam: Vital signs in last 24 hours: Temp:  [97.6 F (36.4 C)-98.2 F (36.8 C)] 97.7 F (36.5 C) (04/09 0535) Pulse Rate:  [73-89] 73 (04/09 0535) Resp:  [15-17] 16 (04/09 0535) BP: (112-187)/(75-96) 159/75 (04/09 0535) SpO2:  [96 %-100 %] 98 % (04/09 0535) Weight change:  Last BM Date : 12/30/21  Intake/Output from previous day: 04/08 0701 - 04/09 0700 In: 840 [P.O.:840] Out: -  Total I/O In: -  Out: 1 [Stool:1]   Physical Exam: General- pt is awake,alert, oriented to time place and person Resp- No acute REsp distress, CTA B/L NO Rhonchi CVS- S1S2 regular in rate and rhythm GIT- BS+, soft, NT, ND EXT- NO LE Edema, no Cyanosis CNS- CN 2-12 grossly intact. Moving all 4 extremities Psych- normal mood and affect    Lab Results: CBC Recent Labs    12/31/21 0500 01/01/22 0446  WBC 7.4 10.4  HGB 9.0* 9.0*  HCT 29.0* 29.2*  PLT 293 333    BMET Recent Labs    01/01/22 0446 01/01/22 0634  NA 131* 130*  K 6.4* 6.5*  CL 100 100  CO2 22 22  GLUCOSE 352* 361*  BUN 58* 60*  CREATININE 3.16* 3.26*  CALCIUM 8.3* 8.4*    MICRO Recent Results (from the past 240 hour(s))  Respiratory (~20 pathogens) panel by PCR     Status: Abnormal   Collection Time: 12/27/21 11:32 AM   Specimen: Nasopharyngeal Swab; Respiratory  Result Value Ref Range Status   Adenovirus NOT DETECTED NOT DETECTED Final   Coronavirus 229E NOT DETECTED NOT DETECTED Final    Comment: (NOTE) The Coronavirus on the Respiratory Panel, DOES NOT test for the novel  Coronavirus (2019 nCoV)    Coronavirus HKU1 NOT DETECTED NOT DETECTED Final   Coronavirus NL63 NOT DETECTED NOT DETECTED Final   Coronavirus OC43 NOT DETECTED NOT DETECTED Final   Metapneumovirus NOT DETECTED NOT DETECTED Final   Rhinovirus / Enterovirus NOT DETECTED NOT DETECTED Final   Influenza A NOT DETECTED NOT DETECTED Final   Influenza B NOT DETECTED NOT DETECTED Final   Parainfluenza Virus 1 NOT DETECTED NOT  DETECTED Final   Parainfluenza Virus 2 NOT DETECTED NOT DETECTED Final   Parainfluenza Virus 3 DETECTED (A) NOT DETECTED Final   Parainfluenza Virus 4 NOT DETECTED NOT DETECTED Final   Respiratory Syncytial Virus NOT DETECTED NOT DETECTED Final   Bordetella pertussis NOT DETECTED NOT DETECTED Final   Bordetella Parapertussis NOT DETECTED NOT DETECTED Final   Chlamydophila pneumoniae NOT DETECTED NOT DETECTED Final   Mycoplasma pneumoniae NOT DETECTED NOT DETECTED Final    Comment: Performed at [facility], [address], [place], [place] [address]  Resp Panel by RT-PCR (Flu A&B, Covid) Nasopharyngeal Swab     Status: None   Collection Time: 12/27/21 11:32  AM   Specimen: Nasopharyngeal Swab; Nasopharyngeal(NP) swabs in vial transport medium  Result Value Ref Range Status   SARS Coronavirus 2 by RT PCR NEGATIVE NEGATIVE Final    Comment: (NOTE) SARS-CoV-2 target nucleic acids are NOT DETECTED.  The SARS-CoV-2 RNA is generally detectable in upper respiratory specimens during the acute phase of infection. The lowest concentration of SARS-CoV-2 viral copies this assay can detect is 138 copies/mL. A negative result does not preclude SARS-Cov-2 infection and should not be used as the sole basis for treatment or other patient management decisions. A negative result may occur with  improper specimen collection/handling, submission of specimen other than nasopharyngeal swab, presence of viral mutation(s) within the areas targeted by this assay, and inadequate number of viral copies(<138 copies/mL). A negative result must be combined with clinical observations, patient history, and epidemiological information. The expected result is Negative.  Fact Sheet for Patients:  BloggerCourse.com  Fact Sheet for Healthcare Providers:  SeriousBroker.it  This test is no t yet approved or cleared by the [place] FDA and  has been authorized  for detection and/or diagnosis of SARS-CoV-2 by FDA under an Emergency Use Authorization (EUA). This EUA will remain  in effect (meaning this test can be used) for the duration of the COVID-19 declaration under Section 564(b)(1) of the Act, 21 U.S.C.section 360bbb-3(b)(1), unless the authorization is terminated  or revoked sooner.       Influenza A by PCR NEGATIVE NEGATIVE Final   Influenza B by PCR NEGATIVE NEGATIVE Final    Comment: (NOTE) The Xpert Xpress SARS-CoV-2/FLU/RSV plus assay is intended as an aid in the diagnosis of influenza from Nasopharyngeal swab specimens and should not be used as a sole basis for treatment. Nasal washings and aspirates are unacceptable for Xpert Xpress SARS-CoV-2/FLU/RSV testing.  Fact Sheet for Patients: BloggerCourse.com  Fact Sheet for Healthcare Providers: SeriousBroker.it  This test is not yet approved or cleared by the [place] FDA and has been authorized for detection and/or diagnosis of SARS-CoV-2 by FDA under an Emergency Use Authorization (EUA). This EUA will remain in effect (meaning this test can be used) for the duration of the COVID-19 declaration under Section 564(b)(1) of the Act, 21 U.S.C. section 360bbb-3(b)(1), unless the authorization is terminated or revoked.  Performed at [facility], [address] [place], [place] [address]       Lab Results  Component Value Date   CALCIUM 8.4 (L) 01/01/2022      Impression:  Patient is a 71 year old African-American female with a past medical history of coronary artery disease, status post PCI, CHF with reduced ejection fraction, CKD, diabetes mellitus, hypertension who was brought to the ER with chief complaint of numbness.      Acute CVA (cerebrovascular accident) (HCC)   Acute kidney injury superimposed on chronic kidney disease (HCC)   Lobar pneumonia (HCC)   Essential (primary) hypertension   HFrEF  (heart failure with reduced ejection fraction) (HCC)   Type 2 diabetes mellitus without complications (HCC)   Hyperkalemia   Anemia    1)Renal   Acute kidney injury Patient has AKI secondary to ATN Patient had a peak creatinine of 3.8 at the time of admission.  Patient creatinine improved to 2.9 and is now at 3.3.   Patient has AKI on CKD. Patient has CKD stage IV Patient has CKD most likely secondary to diabetes mellitus/hypertension. There is possible contribution from age associated decline. Patient also has seen nephrologist at [facility] with the plan to have  renal biopsy done. Patient has had CKD going back to 2017 with a creatinine was 1.07 and it has progressed to creatinine of 2.6 by November 2022.Patient creatinine as an outpatient on March 24 was at 3.2.  Patient is currently on Plavix and aspirin so cannot plan for inpatient biopsy at this time I discussed with the patient about possible need for renal placement therapy for hyperkalemia    2)HTN Blood pressure is uncontrolled Patient is on metoprolol   3)Anemia oF chronic disease HGb at goal (9--11)  We will ask for anemia work-up  4)CKD Mineral-Bone Disorder-Secondary hyperparathyroidism Patient does have a history of secondary hyperparathyroidism Patient has had an intact PTH of 814 going back to November 2022  5)CVA Patient admitted with acute CVA Patient is currently on aspirin and Plavix Patient is being followed by neurology and the primary team   6)Hyperkalemia Patient earlier had hypokalemia at the time of presentation with potassium of 3.2, patient potassium is now high at 6.5 Patient is on Lokelma and Lasix   7)Acid base Co2 at goal   8)Hyponatremia Patient has hyponatremia secondary to hyperglycemia   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 as an outpatient at [facility]   10)Lobar pneumonia Secondary to parainfluenza virus 3 and possibly superimposed bacterial infection Patient is on IV  antibiotics Primary team is following closely   11)Diabetes mellitus type II Patient currently has uncontrolled hyperglycemia secondary to steroids Primary team is following   Plan:  Patient has complicated medical issues of acute kidney injury, CKD stage IV, hypertension, anemia of chronic disease, secondary hyperparathyroidism, admitted with acute CVA, currently on aspirin and Plavix, hyponatremia, uncontrolled hyperglycemia, admitted with hypokalemia but now hyperkalemic I agree with the medical management of hyperkalemia We will ask for anemia profile Will ask for renal ultrasound Patient does not need acute dialysis at this time as patient does not have any EKG changes. In case patient does not respond to [facility] and Lasix patient will need renal placement therapy I did discuss this with the patient and her family member after taking permission from patient. I also discussed the case with the primary attending.   Fredericka Bottcher s Wolfgang [place] 01/01/2022, 8:22 AM

## 2022-01-02 DIAGNOSIS — E875 Hyperkalemia: Secondary | ICD-10-CM | POA: Diagnosis not present

## 2022-01-02 DIAGNOSIS — E1169 Type 2 diabetes mellitus with other specified complication: Secondary | ICD-10-CM

## 2022-01-02 DIAGNOSIS — I639 Cerebral infarction, unspecified: Secondary | ICD-10-CM | POA: Diagnosis not present

## 2022-01-02 LAB — GLUCOSE, CAPILLARY
Glucose-Capillary: 463 mg/dL — ABNORMAL HIGH (ref 70–99)
Glucose-Capillary: 477 mg/dL — ABNORMAL HIGH (ref 70–99)
Glucose-Capillary: 509 mg/dL (ref 70–99)
Glucose-Capillary: 441 mg/dL — ABNORMAL HIGH (ref 70–99)
Glucose-Capillary: 427 mg/dL — ABNORMAL HIGH (ref 70–99)

## 2022-01-02 LAB — BASIC METABOLIC PANEL
Anion gap: 11 (ref 5–15)
BUN: 71 mg/dL — ABNORMAL HIGH (ref 8–23)
CO2: 22 mmol/L (ref 22–32)
Calcium: 8 mg/dL — ABNORMAL LOW (ref 8.9–10.3)
Chloride: 94 mmol/L — ABNORMAL LOW (ref 98–111)
Creatinine, Ser: 3.58 mg/dL — ABNORMAL HIGH (ref 0.44–1.00)
GFR, Estimated: 13 mL/min — ABNORMAL LOW (ref >60)
Glucose, Bld: 527 mg/dL (ref 70–99)
Potassium: 5.5 mmol/L — ABNORMAL HIGH (ref 3.5–5.1)
Sodium: 127 mmol/L — ABNORMAL LOW (ref 135–145)

## 2022-01-02 LAB — GLUCOSE, RANDOM
Glucose, Bld: 497 mg/dL — ABNORMAL HIGH (ref 70–99)
Glucose, Bld: 509 mg/dL (ref 70–99)

## 2022-01-02 LAB — CBC
HCT: 28.7 % — ABNORMAL LOW (ref 36.0–46.0)
Hemoglobin: 8.9 g/dL — ABNORMAL LOW (ref 12.0–15.0)
MCH: 26.2 pg (ref 26.0–34.0)
MCHC: 31 g/dL (ref 30.0–36.0)
MCV: 84.4 fL (ref 80.0–100.0)
Platelets: 349 10*3/uL (ref 150–400)
RBC: 3.4 MIL/uL — ABNORMAL LOW (ref 3.87–5.11)
RDW: 14.1 % (ref 11.5–15.5)
WBC: 14.3 10*3/uL — ABNORMAL HIGH (ref 4.0–10.5)
nRBC: 0 % (ref 0.0–0.2)

## 2022-01-02 LAB — POTASSIUM
Potassium: 5 mmol/L (ref 3.5–5.1)
Potassium: 5.1 mmol/L (ref 3.5–5.1)
Potassium: 5.7 mmol/L — ABNORMAL HIGH (ref 3.5–5.1)

## 2022-01-02 MED ORDER — INSULIN ASPART 100 UNIT/ML IJ SOLN
0.0000 [IU] | Freq: Three times a day (TID) | INTRAMUSCULAR | Status: DC
  Administered 2022-01-02 – 2022-01-04 (×7): 20 [IU] via SUBCUTANEOUS
  Administered 2022-01-04: 4 [IU] via SUBCUTANEOUS
  Administered 2022-01-04 – 2022-01-05 (×2): 20 [IU] via SUBCUTANEOUS
  Administered 2022-01-05 (×2): 3 [IU] via SUBCUTANEOUS
  Administered 2022-01-06 (×2): 4 [IU] via SUBCUTANEOUS
  Administered 2022-01-07: 7 [IU] via SUBCUTANEOUS
  Administered 2022-01-08: 4 [IU] via SUBCUTANEOUS
  Administered 2022-01-08: 7 [IU] via SUBCUTANEOUS
  Administered 2022-01-09 – 2022-01-10 (×2): 11 [IU] via SUBCUTANEOUS
  Administered 2022-01-11: 4 [IU] via SUBCUTANEOUS
  Administered 2022-01-12: 7 [IU] via SUBCUTANEOUS
  Filled 2022-01-02 (×21): qty 1

## 2022-01-02 MED ORDER — INSULIN ASPART 100 UNIT/ML IJ SOLN
20.0000 [IU] | Freq: Once | INTRAMUSCULAR | Status: AC
  Administered 2022-01-02: 20 [IU] via SUBCUTANEOUS
  Filled 2022-01-02: qty 1

## 2022-01-02 MED ORDER — INSULIN ASPART 100 UNIT/ML IJ SOLN
10.0000 [IU] | Freq: Once | INTRAMUSCULAR | Status: AC
  Administered 2022-01-02: 10 [IU] via SUBCUTANEOUS
  Filled 2022-01-02: qty 1

## 2022-01-02 MED ORDER — INSULIN ASPART 100 UNIT/ML IJ SOLN: 10.0000 [IU] | Freq: Once | INTRAMUSCULAR | Status: DC

## 2022-01-02 MED ORDER — GLUCERNA SHAKE PO LIQD
237.0000 mL | Freq: Two times a day (BID) | ORAL | Status: DC
  Administered 2022-01-02 (×2): 237 mL via ORAL

## 2022-01-02 MED ORDER — INSULIN GLARGINE-YFGN 100 UNIT/ML ~~LOC~~ SOLN
15.0000 [IU] | Freq: Every day | SUBCUTANEOUS | Status: DC
Start: 2022-01-02 — End: 2022-01-03
  Administered 2022-01-02: 15 [IU] via SUBCUTANEOUS
  Filled 2022-01-02 (×2): qty 0.15

## 2022-01-02 MED ORDER — SODIUM ZIRCONIUM CYCLOSILICATE 10 G PO PACK
10.0000 g | PACK | Freq: Every day | ORAL | Status: DC
  Administered 2022-01-03: 10 g via ORAL
  Filled 2022-01-02 (×2): qty 1

## 2022-01-02 MED ORDER — PREDNISONE 20 MG PO TABS
40.0000 mg | ORAL_TABLET | Freq: Every day | ORAL | Status: DC
  Administered 2022-01-02: 40 mg via ORAL
  Filled 2022-01-02: qty 2

## 2022-01-02 MED ORDER — SODIUM CHLORIDE 0.9 % IV SOLN: INTRAVENOUS | Status: DC

## 2022-01-02 NOTE — TOC Progression Note (Signed)
Transition of Care (TOC) - Progression Note  ? ? ?Patient Details  ?Name: Roise Churchman ?MRN: [id number] ?Date of Birth: [birth year] ? ?Transition of Care (TOC) CM/SW Contact  ?Pete Pelt, RN ?Phone Number: ?01/02/2022, 2:53 PM ? ?Clinical Narrative:   Patient requires continued medical workup for several concerns.  TOC to follow for needs. ? ? ? ?Expected Discharge Plan: [place] ?Barriers to Discharge: Continued Medical Work up ? ?Expected Discharge Plan and Services ?Expected Discharge Plan: [place] ?  ?Discharge Planning Services: CM Consult ?Post Acute Care Choice: Durable Medical Equipment, Home Health ?Living arrangements for the past 2 months: [place] ?                ?DME Arranged: 3-N-1 ?DME Agency: [employer] ?Date DME Agency Contacted: 12/29/21 ?Time DME Agency Contacted: [phone] ?Representative spoke with at DME Agency: Suanne Marker ?HH Arranged: OT, PT ?  ?  ?  ?  ? ? ?Social Determinants of Health (SDOH) Interventions ?  ? ?Readmission Risk Interventions ? ?  12/29/2021  ?  2:33 PM  ?Readmission Risk Prevention Plan  ?Transportation Screening Complete  ?PCP or Specialist Appt within 5-7 Days Complete  ?Home Care Screening Complete  ?Medication Review (RN CM) Complete  ? ? ?

## 2022-01-02 NOTE — Progress Notes (Signed)
Capillary blood sugar recheck - 463.  ?Deanna [name] reported eating pears, slice of cake, and bag of chips since receiving the last dose of Novolog. ?On-call provider notified of glucose.  ?Orders placed by provider for an additional 20 units of Novolog and to recheck with breakfast.  ?Deanna [name] reports taking 65 units of lantus in addition to using an insulin sliding scale at home ? ? ?

## 2022-01-02 NOTE — Progress Notes (Signed)
PT Cancellation Note ? ?Patient Details ?Name: Deanna [name] ?MRN: [id number] ?DOB: [birth year] ? ? ?Cancelled Treatment:    Reason Eval/Treat Not Completed: Medical issues which prohibited therapy. Patient with excessive Blood Glucose, 509 this afternoon; contraindicated for exertional activity at this time.. Will hold this pm and continue to monitor for appropriateness.  ? ? ?Natha Guin ?01/02/2022, 3:11 PM ?

## 2022-01-02 NOTE — Progress Notes (Signed)
?[facility]  ?ROUNDING NOTE  ? ?Subjective:  ? ?Patient resting in bed ?States she was receiving assistance to bathroom when she became dizzy.  ?Appetite improving ?Denies nausea and vomiting ?Denies shortness of breath, remains on room air ? ?Creatinine 3.58 ?Sodium 127 ?Potassium 5.5 ?BUN 74 ?UOP 483m on night shift ? ?Objective:  ?Vital signs in last 24 hours:  ?Temp:  [97.6 ?F (36.4 ?C)-97.9 ?F (36.6 ?C)] 97.6 ?F (36.4 ?C) (04/10 0818) ?Pulse Rate:  [84-97] 96 (04/10 0818) ?Resp:  [16-18] 17 (04/10 0818) ?BP: (152-173)/(70-85) 153/74 (04/10 0818) ?SpO2:  [97 %-100 %] 100 % (04/10 0818) ? ?Weight change:  ?Filed Weights  ? 12/27/21 1300  ?Weight: 67.8 kg  ? ? ?Intake/Output: ?I/O last 3 completed shifts: ?In: 600 [P.O.:600] ?Out: 401 [Urine:400; Stool:1] ?  ?Intake/Output this shift: ? Total I/O ?In: 240 [P.O.:240] ?Out: 100 [Urine:100] ? ?Physical Exam: ?General: NAD, laying in bed  ?Head: Normocephalic, atraumatic. Moist oral mucosal membranes  ?Eyes: Anicteric  ?Lungs:  Clear to auscultation, normal effort  ?Heart: Regular rate and rhythm  ?Abdomen:  Soft, nontender  ?Extremities:  No peripheral edema.  ?Neurologic: Nonfocal, moving all four extremities  ?Skin: No lesions  ?Access: none  ? ? ?Basic Metabolic Panel: ?Recent Labs  ?Lab 12/30/21 ?0611 12/31/21 ?0500 12/31/21 ?1301 01/01/22 ?0446 01/01/22 ?0952804/09/23 ?1032 01/01/22 ?1438 01/01/22 ?1821 01/01/22 ?2413204/10/23 ?04401 ?NA 138 131*  --  131* 130*  --   --   --   --  127*  ?K 5.1 5.7*   < > 6.4* 6.5* 5.6* 5.5* 6.3* 5.7* 5.5*  ?CL 108 100  --  100 100  --   --   --   --  94*  ?CO2 19* 21*  --  22 22  --   --   --   --  22  ?GLUCOSE 107* 277*  --  352* 361*  --   --   --  509* 527*  ?BUN 46* 47*  --  58* 60*  --   --   --   --  71*  ?CREATININE 2.91* 3.08*  --  3.16* 3.26*  --   --   --   --  3.58*  ?CALCIUM 7.9* 8.7*  --  8.3* 8.4*  --   --   --   --  8.0*  ? < > = values in this interval not displayed.  ? ? ?Liver Function  Tests: ?Recent Labs  ?Lab 12/26/21 ?1621 12/28/21 ?00272 ?AST 13* 11*  ?ALT 8 6  ?ALKPHOS 116 95  ?BILITOT 0.4 0.3  ?PROT 7.6 6.7  ?ALBUMIN 3.2* 2.6*  ? ?No results for input(s): LIPASE, AMYLASE in the last 168 hours. ?No results for input(s): AMMONIA in the last 168 hours. ? ?CBC: ?Recent Labs  ?Lab 12/26/21 ?1621 12/27/21 ?0536604/06/23 ?0440304/07/23 ?0474204/08/23 ?0500 01/01/22 ?0446 01/02/22 ?05956 ?WBC 9.6   < > 17.0* 11.3* 7.4 10.4 14.3*  ?NEUTROABS 6.9  --   --   --   --   --   --   ?HGB 10.2*   < > 9.4* 9.3* 9.0* 9.0* 8.9*  ?HCT 32.8*   < > 30.4* 29.9* 29.0* 29.2* 28.7*  ?MCV 85.2   < > 85.4 85.7 85.0 84.1 84.4  ?PLT 234   < > 253 275 293 333 349  ? < > = values in this interval not displayed.  ? ? ?Cardiac Enzymes: ?No results for input(s):  CKTOTAL, CKMB, CKMBINDEX, TROPONINI in the last 168 hours. ? ?BNP: ?Invalid input(s): POCBNP ? ?CBG: ?Recent Labs  ?Lab 01/01/22 ?1246 01/01/22 ?1540 01/01/22 ?2214 01/02/22 ?0358 01/02/22 ?0819  ?GLUCAP [place] ?Microbiology: ?Results for orders placed or performed during the hospital encounter of 12/26/21  ?Respiratory (~20 pathogens) panel by PCR     Status: Abnormal  ? Collection Time: 12/27/21 11:32 AM  ? Specimen: Nasopharyngeal Swab; Respiratory  ?Result Value Ref Range Status  ? Adenovirus NOT DETECTED NOT DETECTED Final  ? Coronavirus 229E NOT DETECTED NOT DETECTED Final  ?  Comment: (NOTE) ?The Coronavirus on the Respiratory Panel, DOES NOT test for the novel  ?Coronavirus (2019 nCoV) ?  ? Coronavirus HKU1 NOT DETECTED NOT DETECTED Final  ? Coronavirus NL63 NOT DETECTED NOT DETECTED Final  ? Coronavirus OC43 NOT DETECTED NOT DETECTED Final  ? Metapneumovirus NOT DETECTED NOT DETECTED Final  ? Rhinovirus / Enterovirus NOT DETECTED NOT DETECTED Final  ? Influenza A NOT DETECTED NOT DETECTED Final  ? Influenza B NOT DETECTED NOT DETECTED Final  ? Parainfluenza Virus 1 NOT DETECTED NOT DETECTED Final  ? Parainfluenza Virus 2 NOT DETECTED NOT  DETECTED Final  ? Parainfluenza Virus 3 DETECTED (A) NOT DETECTED Final  ? Parainfluenza Virus 4 NOT DETECTED NOT DETECTED Final  ? Respiratory Syncytial Virus NOT DETECTED NOT DETECTED Final  ? Bordetella pertussis NOT DETECTED NOT DETECTED Final  ? Bordetella Parapertussis NOT DETECTED NOT DETECTED Final  ? Chlamydophila pneumoniae NOT DETECTED NOT DETECTED Final  ? Mycoplasma pneumoniae NOT DETECTED NOT DETECTED Final  ?  Comment: Performed at [facility], [place] [address] [place], [place] [address]  ?Resp Panel by RT-PCR (Flu A&B, Covid) Nasopharyngeal Swab     Status: None  ? Collection Time: 12/27/21 11:32 AM  ? Specimen: Nasopharyngeal Swab; Nasopharyngeal(NP) swabs in vial transport medium  ?Result Value Ref Range Status  ? SARS Coronavirus 2 by RT PCR NEGATIVE NEGATIVE Final  ?  Comment: (NOTE) ?SARS-CoV-2 target nucleic acids are NOT DETECTED. ? ?The SARS-CoV-2 RNA is generally detectable in upper respiratory ?specimens during the acute phase of infection. The lowest ?concentration of SARS-CoV-2 viral copies this assay can detect is ?138 copies/mL. A negative result does not preclude SARS-Cov-2 ?infection and should not be used as the sole basis for treatment or ?other patient management decisions. A negative result may occur with  ?improper specimen collection/handling, submission of specimen other ?than nasopharyngeal swab, presence of viral mutation(s) within the ?areas targeted by this assay, and inadequate number of viral ?copies(<138 copies/mL). A negative result must be combined with ?clinical observations, patient history, and epidemiological ?information. The expected result is Negative. ? ?Fact Sheet for Patients:  ?EntrepreneurPulse.com.au ? ?Fact Sheet for Healthcare Providers:  ?IncredibleEmployment.be ? ?This test is no t yet approved or cleared by the [place] FDA and  ?has been authorized for detection and/or diagnosis of SARS-CoV-2 by ?FDA  under an Emergency Use Authorization (EUA). This EUA will remain  ?in effect (meaning this test can be used) for the duration of the ?COVID-19 declaration under Section 564(b)(1) of the Act, 21 ?U.S.C.section 360bbb-3(b)(1), unless the authorization is terminated  ?or revoked sooner.  ? ? ?  ? Influenza A by PCR NEGATIVE NEGATIVE Final  ? Influenza B by PCR NEGATIVE NEGATIVE Final  ?  Comment: (NOTE) ?The Xpert Xpress SARS-CoV-2/FLU/RSV plus assay is intended as an aid ?in the diagnosis of influenza from Nasopharyngeal swab specimens and ?should  not be used as a sole basis for treatment. Nasal washings and ?aspirates are unacceptable for Xpert Xpress SARS-CoV-2/FLU/RSV ?testing. ? ?Fact Sheet for Patients: ?EntrepreneurPulse.com.au ? ?Fact Sheet for Healthcare Providers: ?IncredibleEmployment.be ? ?This test is not yet approved or cleared by the [place] FDA and ?has been authorized for detection and/or diagnosis of SARS-CoV-2 by ?FDA under an Emergency Use Authorization (EUA). This EUA will remain ?in effect (meaning this test can be used) for the duration of the ?COVID-19 declaration under Section 564(b)(1) of the Act, 21 U.S.C. ?section 360bbb-3(b)(1), unless the authorization is terminated or ?revoked. ? ?Performed at [facility], [place], ?[place] [address] ?  ? ? ?Coagulation Studies: ?No results for input(s): LABPROT, INR in the last 72 hours. ? ?Urinalysis: ?No results for input(s): COLORURINE, LABSPEC, [place], GLUCOSEU, HGBUR, BILIRUBINUR, KETONESUR, PROTEINUR, UROBILINOGEN, NITRITE, LEUKOCYTESUR in the last 72 hours. ? ?Invalid input(s): APPERANCEUR  ? ? ?Imaging: ?US RENAL ? ?Result Date: 01/01/2022 ?CLINICAL DATA:  Acute tubular necrosis EXAM: RENAL / URINARY TRACT ULTRASOUND COMPLETE COMPARISON:  None. FINDINGS: Right Kidney: Renal measurements: 9.6 x 4.1 x 4.3 cm = volume: 88 mL. Echogenicity within normal limits. No mass or  hydronephrosis visualized. Left Kidney: Renal measurements: 9.8 x 4.6 x 4.4 cm = volume: 103 mL. Echogenicity within normal limits. No mass or hydronephrosis visualized. Bladder: Appears normal for degree of bladder distentio

## 2022-01-02 NOTE — Progress Notes (Signed)
?PROGRESS NOTE ? ? ? ?Elonda Record  NAT:[id number] DOB: April 12, 1951 DOA: 12/26/2021 ?PCP: [facility] [place] ? ?Assessment & Plan: ?  ?Principal Problem: ?  Right sided weakness ?Active Problems: ?  Acute CVA (cerebrovascular accident) ([place]) ?  Acute kidney injury superimposed on chronic kidney disease ([place]) ?  Lobar pneumonia ([place]) ?  Essential (primary) hypertension ?  HFrEF (heart failure with reduced ejection fraction) ([place]) ?  Type 2 diabetes mellitus without complications ([place]) ?  Hypokalemia ?  Anemia ?  CVA (cerebral vascular accident) [facility]) ? ?  ?  ?Acute CVA: as per MRI. Continue on plavix, aspirin x 21 days & then aspirin monotherapy as per neuro  ? ?Lobar pneumonia: possibly secondary to parainfluenza virus 3 & possible superimposed bacterial infection. Completed abx course. COVID19, influenza were both neg.  ? 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 Continue on steroid taper as BS are significantly elevated. Continue on oxycodone. Will need to f/u outpatient w/ neuro surg, Dr. Lacinda Axon ? ?Hyperkalemia: still elevated but trending down. Continue lokelma. Nephro following and recs apprec  ?  ?AKI on CKDIV: baseline Cr 3.19. Cr continues to trend up. Nephro following and recs apprec  ? ?ACD: likely secondary to CKD. H&H are labile   ?  ?DM2: well controlled, HbA1c is 5.5. Started on glargine, increased SSI w/ accuchecks. Decreased steroid dose and change diet to carb modified. Elevated sugars likely secondary to current steroid use  ?  ?Chronic systolic CHF: appears euvolemic. Continue on metoprolol. Hold lasix. Monitor I/Os  ?  ?HTN: continue on metoprolol  ? ? ?DVT prophylaxis: heparin sq  ?Code Status: full  ?Family Communication: discussed pt's care w/ pt's granddaughter, [name], and answered her questions  ?Disposition Plan: d/c home w/ HH  ? ?Level of care: Telemetry Medical ? ?Status is:  Inpatient ?Remains inpatient appropriate because: hyperkalemia, elevated BS ? ? ?Consultants:  ?Neuro ?Neuro surg  ?Nephro  ? ?Procedures: ? ?Antimicrobials:  ? ? ?Subjective: ?Pt c/o malaise  ? ?Objective: ?Vitals:  ? 01/01/22 1155 01/01/22 1612 01/01/22 2025 01/02/22 4270  ?BP: (!) 153/75 (!) 162/75 (!) 173/85 (!) 152/70  ?Pulse: 84 85 86 97  ?Resp: '18  18 16  '$ ?Temp: 97.6 ?F (36.4 ?C) 97.6 ?F (36.4 ?C) 97.7 ?F (36.5 ?C) 97.9 ?F (36.6 ?C)  ?TempSrc: Oral Oral    ?SpO2: 97% 100% 100% 97%  ?Weight:      ?Height:      ? ? ?Intake/Output Summary (Last 24 hours) at 01/02/2022 0730 ?Last data filed at 01/01/2022 2026 ?Gross per 24 hour  ?Intake 240 ml  ?Output 401 ml  ?Net -161 ml  ? ? ?Filed Weights  ? 12/27/21 1300  ?Weight: 67.8 kg  ? ? ?Examination: ? ?General exam: Appears calm but uncomfortable  ?Respiratory system: clear breath sounds b/l. No wheezes ?Cardiovascular system: S1/S2+. No gallops or rubs  ?Gastrointestinal system: abd is soft, NT, ND & normal bowel sounds  ?Central nervous system: Alert and oriented. Moves all extremities   ?Psychiatry: Judgement and insight appears normal. Flat mood and affect  ? ? ? ?Data Reviewed: I have personally reviewed following labs and imaging studies ? ?CBC: ?Recent Labs  ?Lab 12/26/21 ?1621 12/27/21 ?6237 12/29/21 ?6283 12/30/21 ?1517 12/31/21 ?0500 01/01/22 ?0446 01/02/22 ?6160  ?WBC 9.6   < > 17.0* 11.3* 7.4 10.4 14.3*  ?NEUTROABS 6.9  --   --   --   --   --   --   ?  HGB 10.2*   < > 9.4* 9.3* 9.0* 9.0* 8.9*  ?HCT 32.8*   < > 30.4* 29.9* 29.0* 29.2* 28.7*  ?MCV 85.2   < > 85.4 85.7 85.0 84.1 84.4  ?PLT 234   < > 253 275 293 333 349  ? < > = values in this interval not displayed.  ? ?Basic Metabolic Panel: ?Recent Labs  ?Lab 12/30/21 ?0611 12/31/21 ?0500 12/31/21 ?1301 01/01/22 ?0446 01/01/22 ?2836 01/01/22 ?1032 01/01/22 ?1438 01/01/22 ?1821 01/01/22 ?6294 01/02/22 ?7654  ?NA 138 131*  --  131* 130*  --   --   --   --  127*  ?K 5.1 5.7*   < > 6.4* 6.5* 5.6* 5.5* 6.3* 5.7*  5.5*  ?CL 108 100  --  100 100  --   --   --   --  94*  ?CO2 19* 21*  --  22 22  --   --   --   --  22  ?GLUCOSE 107* 277*  --  352* 361*  --   --   --  509* 527*  ?BUN 46* 47*  --  58* 60*  --   --   --   --  71*  ?CREATININE 2.91* 3.08*  --  3.16* 3.26*  --   --   --   --  3.58*  ?CALCIUM 7.9* 8.7*  --  8.3* 8.4*  --   --   --   --  8.0*  ? < > = values in this interval not displayed.  ? ?GFR: ?Estimated Creatinine Clearance: 15.7 mL/min (A) (by C-G formula based on SCr of 3.58 mg/dL (H)). ?Liver Function Tests: ?Recent Labs  ?Lab 12/26/21 ?1621 12/28/21 ?6503  ?AST 13* 11*  ?ALT 8 6  ?ALKPHOS 116 95  ?BILITOT 0.4 0.3  ?PROT 7.6 6.7  ?ALBUMIN 3.2* 2.6*  ? ?No results for input(s): LIPASE, AMYLASE in the last 168 hours. ?No results for input(s): AMMONIA in the last 168 hours. ?Coagulation Profile: ?Recent Labs  ?Lab 12/26/21 ?1707  ?INR 1.0  ? ?Cardiac Enzymes: ?No results for input(s): CKTOTAL, CKMB, CKMBINDEX, TROPONINI in the last 168 hours. ?BNP (last 3 results) ?No results for input(s): PROBNP in the last 8760 hours. ?HbA1C: ?No results for input(s): HGBA1C in the last 72 hours. ? ?CBG: ?Recent Labs  ?Lab 01/01/22 ?1000 01/01/22 ?1246 01/01/22 ?1540 01/01/22 ?2214 01/02/22 ?0358  ?GLUCAP [place] ?Lipid Profile: ?No results for input(s): CHOL, HDL, LDLCALC, TRIG, CHOLHDL, LDLDIRECT in the last 72 hours. ? ?Thyroid Function Tests: ?No results for input(s): TSH, T4TOTAL, FREET4, T3FREE, THYROIDAB in the last 72 hours. ?Anemia Panel: ?Recent Labs  ?  01/01/22 ?1032  ?VITAMINB12 522  ?FOLATE 5.4*  ?FERRITIN 143  ?TIBC 239*  ?IRON 79  ? ? ?Sepsis Labs: ?Recent Labs  ?Lab 12/28/21 ?0611  ?PROCALCITON 0.43  ? ? ?Recent Results (from the past 240 hour(s))  ?Respiratory (~20 pathogens) panel by PCR     Status: Abnormal  ? Collection Time: 12/27/21 11:32 AM  ? Specimen: Nasopharyngeal Swab; Respiratory  ?Result Value Ref Range Status  ? Adenovirus NOT DETECTED NOT DETECTED Final  ? Coronavirus 229E  NOT DETECTED NOT DETECTED Final  ?  Comment: (NOTE) ?The Coronavirus on the Respiratory Panel, DOES NOT test for the novel  ?Coronavirus (2019 nCoV) ?  ? Coronavirus HKU1 NOT DETECTED NOT DETECTED Final  ? Coronavirus NL63 NOT DETECTED NOT DETECTED Final  ? Coronavirus OC43 NOT DETECTED  NOT DETECTED Final  ? Metapneumovirus NOT DETECTED NOT DETECTED Final  ? Rhinovirus / Enterovirus NOT DETECTED NOT DETECTED Final  ? Influenza A NOT DETECTED NOT DETECTED Final  ? Influenza B NOT DETECTED NOT DETECTED Final  ? Parainfluenza Virus 1 NOT DETECTED NOT DETECTED Final  ? Parainfluenza Virus 2 NOT DETECTED NOT DETECTED Final  ? Parainfluenza Virus 3 DETECTED (A) NOT DETECTED Final  ? Parainfluenza Virus 4 NOT DETECTED NOT DETECTED Final  ? Respiratory Syncytial Virus NOT DETECTED NOT DETECTED Final  ? Bordetella pertussis NOT DETECTED NOT DETECTED Final  ? Bordetella Parapertussis NOT DETECTED NOT DETECTED Final  ? Chlamydophila pneumoniae NOT DETECTED NOT DETECTED Final  ? Mycoplasma pneumoniae NOT DETECTED NOT DETECTED Final  ?  Comment: Performed at [facility], [place] [address] [place], [place] [address]  ?Resp Panel by RT-PCR (Flu A&B, Covid) Nasopharyngeal Swab     Status: None  ? Collection Time: 12/27/21 11:32 AM  ? Specimen: Nasopharyngeal Swab; Nasopharyngeal(NP) swabs in vial transport medium  ?Result Value Ref Range Status  ? SARS Coronavirus 2 by RT PCR NEGATIVE NEGATIVE Final  ?  Comment: (NOTE) ?SARS-CoV-2 target nucleic acids are NOT DETECTED. ? ?The SARS-CoV-2 RNA is generally detectable in upper respiratory ?specimens during the acute phase of infection. The lowest ?concentration of SARS-CoV-2 viral copies this assay can detect is ?138 copies/mL. A negative result does not preclude SARS-Cov-2 ?infection and should not be used as the sole basis for treatment or ?other patient management decisions. A negative result may occur with  ?improper specimen collection/handling, submission of specimen  other ?than nasopharyngeal swab, presence of viral mutation(s) within the ?areas targeted by this assay, and inadequate number of viral ?copies(<138 copies/mL). A negative result must be combined with ?clinical observations, pat

## 2022-01-02 NOTE — Progress Notes (Signed)
OT Cancellation Note ? ?Patient Details ?Name: Deanna [name] ?MRN: [id number] ?DOB: 04-05-1951 ? ? ?Cancelled Treatment:    Reason Eval/Treat Not Completed: Medical issues which prohibited therapy. Chart reviewed - pt noted to have glucose critically high at 509 this afternoon; contraindicated for exertional activity at this time. Will continue to follow and provide OT services as pt medically appropriate to participate in therapy.   ? ?Fredirick Maudlin, OTR/L ?[place] ? ?

## 2022-01-02 NOTE — Progress Notes (Signed)
Inpatient Diabetes Program Recommendations ? ?AACE/ADA: New Consensus Statement on Inpatient Glycemic Control (2015) ? ?Target Ranges:  Prepandial:   less than 140 mg/dL ?     Peak postprandial:   less than 180 mg/dL (1-2 hours) ?     Critically ill patients:  140 - 180 mg/dL  ? ? Latest Reference Range & Units 12/27/21 06:11  ?Hemoglobin A1C 4.8 - 5.6 % 5.5  ? ? Latest Reference Range & Units 01/01/22 07:39 01/01/22 08:50 01/01/22 10:00 01/01/22 12:46 01/01/22 15:40 01/01/22 22:14  ?Glucose-Capillary 70 - 99 mg/dL 311 (H) ? ?10 units Novolog ? 305 (H) 307 (H) 378 (H) ? ?5 units Novolog ? 396 (H) ? ?15 units Novolog ? 446 (H)  ? ? ?Admit with: Right sided weakness/ Acute CVA/ Pneumonia ? ?History: DM, CKD ? ?Home DM Meds: Lantus 65 units QHS ? ?Current Orders: Novolog Moderate Correction Scale/ SSI (0-15 units) TID AC + HS ? ? ? ? ?MD- Note patient receiving Prednisone 40 mg BID. Per Home Med Rec, pt take Lantus at home.   ? ?Please consider: ? ?1. Start Semglee 30 units Daily (50% home dose) ? ?2. Start Novolog Meal Coverage: Novolog 3 units TID with meals ?HOLD if pt eats <50% meals ? ? ? ?--Will follow patient during hospitalization-- ? ?Wyn Quaker RN, MSN, CDE ?Diabetes Coordinator ?Inpatient Glycemic Control Team ?Team Pager: [phone] (8a-5p) ? ? ? ? ?

## 2022-01-02 NOTE — Progress Notes (Signed)
CRITICAL VALUE STICKER ? ?CRITICAL VALUE:  ?Potassium 5.7 ?Serum glucose: 509 ? ?RECEIVER (on-site recipient of call): ?Leafy Kindle ? ?DATE & TIME NOTIFIED:  ?01/02/22 0026 ? ?MESSENGER (representative from lab): ? ?MD NOTIFIED:  ?Dr. Otilio Miu ? ?TIME OF NOTIFICATION: ?01/02/22 0037 ? ?RESPONSE:  ?Potassium: Coninue Lokelma, no additional orders or interventions at this time ?Glucose: Administer 15units Novolog (MD ordered an additional 10 units Novolog to be given with the HS sliding scale for a total of 15 units Novolog) ? ?

## 2022-01-03 DIAGNOSIS — E875 Hyperkalemia: Secondary | ICD-10-CM | POA: Diagnosis not present

## 2022-01-03 DIAGNOSIS — E1169 Type 2 diabetes mellitus with other specified complication: Secondary | ICD-10-CM | POA: Diagnosis not present

## 2022-01-03 DIAGNOSIS — I639 Cerebral infarction, unspecified: Secondary | ICD-10-CM | POA: Diagnosis not present

## 2022-01-03 LAB — CBC
HCT: 27.1 % — ABNORMAL LOW (ref 36.0–46.0)
Hemoglobin: 8.3 g/dL — ABNORMAL LOW (ref 12.0–15.0)
MCH: 26 pg (ref 26.0–34.0)
MCHC: 30.6 g/dL (ref 30.0–36.0)
MCV: 85 fL (ref 80.0–100.0)
Platelets: 321 10*3/uL (ref 150–400)
RBC: 3.19 MIL/uL — ABNORMAL LOW (ref 3.87–5.11)
RDW: 14.1 % (ref 11.5–15.5)
WBC: 16.7 10*3/uL — ABNORMAL HIGH (ref 4.0–10.5)
nRBC: 0 % (ref 0.0–0.2)

## 2022-01-03 LAB — BASIC METABOLIC PANEL
Anion gap: 10 (ref 5–15)
BUN: 82 mg/dL — ABNORMAL HIGH (ref 8–23)
CO2: 21 mmol/L — ABNORMAL LOW (ref 22–32)
Calcium: 8.4 mg/dL — ABNORMAL LOW (ref 8.9–10.3)
Chloride: 98 mmol/L (ref 98–111)
Creatinine, Ser: 3.87 mg/dL — ABNORMAL HIGH (ref 0.44–1.00)
GFR, Estimated: 12 mL/min — ABNORMAL LOW (ref >60)
Glucose, Bld: 454 mg/dL — ABNORMAL HIGH (ref 70–99)
Potassium: 6.1 mmol/L — ABNORMAL HIGH (ref 3.5–5.1)
Sodium: 129 mmol/L — ABNORMAL LOW (ref 135–145)

## 2022-01-03 LAB — GLUCOSE, CAPILLARY
Glucose-Capillary: 534 mg/dL (ref 70–99)
Glucose-Capillary: 535 mg/dL (ref 70–99)
Glucose-Capillary: 478 mg/dL — ABNORMAL HIGH (ref 70–99)
Glucose-Capillary: 347 mg/dL — ABNORMAL HIGH (ref 70–99)

## 2022-01-03 MED ORDER — PATIROMER SORBITEX CALCIUM 8.4 G PO PACK
16.8000 g | PACK | Freq: Every day | ORAL | Status: DC
  Administered 2022-01-03 – 2022-01-06 (×4): 16.8 g via ORAL
  Filled 2022-01-03 (×4): qty 2

## 2022-01-03 MED ORDER — GLUCERNA SHAKE PO LIQD
237.0000 mL | Freq: Three times a day (TID) | ORAL | Status: DC
Start: 2022-01-03 — End: 2022-01-03

## 2022-01-03 MED ORDER — PREDNISONE 20 MG PO TABS
20.0000 mg | ORAL_TABLET | Freq: Every day | ORAL | Status: DC
  Administered 2022-01-03: 20 mg via ORAL
  Filled 2022-01-03: qty 1

## 2022-01-03 MED ORDER — INSULIN GLARGINE-YFGN 100 UNIT/ML ~~LOC~~ SOLN
25.0000 [IU] | Freq: Every day | SUBCUTANEOUS | Status: DC
  Administered 2022-01-03: 25 [IU] via SUBCUTANEOUS
  Filled 2022-01-03: qty 0.25

## 2022-01-03 MED ORDER — INSULIN GLARGINE-YFGN 100 UNIT/ML ~~LOC~~ SOLN
35.0000 [IU] | Freq: Every day | SUBCUTANEOUS | Status: DC
  Administered 2022-01-04 – 2022-01-12 (×7): 35 [IU] via SUBCUTANEOUS
  Filled 2022-01-03 (×9): qty 0.35

## 2022-01-03 MED ORDER — GLUCERNA SHAKE PO LIQD
237.0000 mL | ORAL | Status: DC
Start: 2022-01-03 — End: 2022-01-03

## 2022-01-03 MED ORDER — NEPRO/CARBSTEADY PO LIQD
237.0000 mL | Freq: Every day | ORAL | Status: DC
Start: 2022-01-03 — End: 2022-01-13
  Administered 2022-01-03 – 2022-01-12 (×10): 237 mL via ORAL

## 2022-01-03 NOTE — Progress Notes (Signed)
Inpatient Diabetes Program Recommendations ? ?AACE/ADA: New Consensus Statement on Inpatient Glycemic Control (2015) ? ?Target Ranges:  Prepandial:   less than 140 mg/dL ?     Peak postprandial:   less than 180 mg/dL (1-2 hours) ?     Critically ill patients:  140 - 180 mg/dL  ? ? Latest Reference Range & Units 01/02/22 03:58 01/02/22 08:19 01/02/22 12:09 01/02/22 15:52 01/02/22 21:14  ?Glucose-Capillary 70 - 99 mg/dL 463 (H) 477 (H) 509 (HH) 441 (H) 427 (H)  ? ? Latest Reference Range & Units 01/03/22 07:55  ?Glucose-Capillary 70 - 99 mg/dL 534 (HH)  ? ? ? ?Admit with: Right sided weakness/ Acute CVA/ Pneumonia ?  ?History: DM, CKD ?  ?Home DM Meds: Lantus 65 units QHS ?  ?Current Orders: Novolog Moderate Correction Scale/ SSI (0-15 units) TID AC + HS ?     Semglee 25 mg daily ?  ?  ?  ?  ?MD- Note Prednisone reduced to 20 mg daily today.  ?Also note Semglee increased to 25 units daily this AM. ? ?CBGs still severely elevated--If this trend continues despite reduction of Prednisone, Please consider: ?  ?1. Increase Semglee to 45 units Daily (70% home dose) ?  ?2. Start Novolog Meal Coverage: Novolog 4 units TID with meals ?HOLD if pt eats <50% meals ? ? ?--Will follow patient during hospitalization-- ? ?Wyn Quaker RN, MSN, CDE ?Diabetes Coordinator ?Inpatient Glycemic Control Team ?Team Pager: [phone] (8a-5p) ? ?  ? ?

## 2022-01-03 NOTE — Progress Notes (Addendum)
?PROGRESS NOTE ? ? ?HPI was taken from Dr. Florina [name]: ?Deanna [name] is a 71 y.o. female with medical history significant of hypertension, CAD with recent stents presenting with right-sided weakness, worse since yesterday evening.  Daughter states that she thinks that after having the cath in the right arm she started having weakness in the right arm discussed with her that usually cardiac catheterization does not cause weakness she also has weakness in her right leg.  Patient is otherwise unable to tell me any other details but denies any complaints and does not report any headaches blurred vision speech or chest pain/ palpitation / abd pain fevers chills or any other complaints.  ?  ?In the emergency room today patient hemoglobin of 10.2, otherwise normal CBC, CMP shows potassium of 3.2 glucose 117 creatinine of 3.84 with a GFR of 12, troponin of 22 with repeat  of 24, patient's BNP is 3.0, CBC shows a hemoglobin of 10.0 and otherwise normal white count and platelet count. ?  ?As per Dr. Leslye [name]: ?71 year old female with past medical history of CAD, hypertension and chronic kidney disease stage IV.  She presents with right-sided numbness and weakness. ?  ?MRI of the brain shows a punctate focus of reduced diffusion within the right superior pericallosal white matter.  Case discussed with neurology and this likely would not be causing her right-sided weakness so MRI of the cervical and thoracic spine ordered an MRA of the brain ordered. ?  ?While I was in the room the patient had a vasovagal event where she got dizzy and lightheaded and was seeing spots.  This resolved quickly.  The patient was not orthostatic before this and blood pressure was okay during the events. ? ?As per Dr. Jimmye [name] 4/5-4/11/23: Pt was found to have acute CVA. Pt will continue on plavix, aspirin 21 day and then just aspirin monotherapy as per neuro. Furthermore, pt was found to have lobar pneumonia secondary to parainfluenza virus and  possible superimpose bacterial infection. Pt completed a course of abxs inpatient. Of note, pt has AKI on CKDIV w/ Cr trending up daily and hyperkalemia. Pt is lokelma and nephro is following. Furthermore, pt has been c/o neck & back for mod-severe foraminal stenosis and pt was started on steroids which improved pain but caused pt's BS to go up. Have been increasing glargine daily and pt is already on resistance SSI. Today will be the last day of steroids. Last HbA1c was 5.5.  ? ? ? ?Deanna [name]  GYI:[id number] DOB: 14-May-1951 DOA: 12/26/2021 ?PCP: [facility] [place] ? ?Assessment & Plan: ?  ?Principal Problem: ?  Right sided weakness ?Active Problems: ?  Acute CVA (cerebrovascular accident) ([place]) ?  Acute kidney injury superimposed on chronic kidney disease ([place]) ?  Lobar pneumonia ([place]) ?  Essential (primary) hypertension ?  HFrEF (heart failure with reduced ejection fraction) ([place]) ?  Type 2 diabetes mellitus without complications ([place]) ?  Hypokalemia ?  Anemia ?  CVA (cerebral vascular accident) [facility]) ? ?  ?  ?Acute CVA: as per MRI. Continue on plavix, aspirin x 21 days & then aspirin monotherapy as per neuro  ? ?Lobar pneumonia: possibly secondary to parainfluenza virus 3 & possible superimposed bacterial infection. Completed abx course. COVID19, influenza were both neg  ? 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 Last day of steroids as BS continue to be significantly elevated. Continue on oxycodone. Will need to f/u  outpatient w/ neuro surg, Dr. Lacinda [name] ? ?Hyperkalemia: labile. Continue on lokelma. Nephro following and recs apprec ?  ?AKI on CKDIV: baseline Cr 3.19. Cr is trending up daily. Was suppose to get kidney biopsy at [facility] next week. Nephro following and recs apprec  ? ?ACD: likely secondary to CKD. No need for a transfusion currently  ?  ?DM2: well controlled, HbA1c is 5.5. Increased glargine dose today,  continue on SSI w/ accuchecks. BS elevated likely secondary to steroid use and will be the last day of steroids. Carb modified diet  ?  ?Chronic systolic CHF: appears euvolemic. Continue on BB. Hold lasix. Monitor I/Os ?  ?HTN: continue on BB ? ? ?DVT prophylaxis: heparin sq  ?Code Status: full  ?Family Communication: called pt's granddaughter, Deanna [name], no answer & unable to leave a voicemail  ?Disposition Plan: d/c home w/ HH  ? ?Level of care: Telemetry Medical ? ?Status is: Inpatient ?Remains inpatient appropriate because: hyperkalemia, elevated BS ? ? ?Consultants:  ?Neuro ?Neuro surg  ?Nephro  ? ?Procedures: ? ?Antimicrobials:  ? ? ?Subjective: ?Pt c/o fatigue ? ?Objective: ?Vitals:  ? 01/02/22 1551 01/02/22 2112 01/03/22 0114 01/03/22 0432  ?BP: (!) 169/77 (!) 161/84 (!) 142/78 (!) 165/79  ?Pulse: 84 88 80 83  ?Resp:  '18 18 17  '$ ?Temp: (!) 97.5 ?F (36.4 ?C) (!) 97.4 ?F (36.3 ?C) 98.4 ?F (36.9 ?C) 97.7 ?F (36.5 ?C)  ?TempSrc: Oral Oral Oral Oral  ?SpO2: 100% 99% 100% 99%  ?Weight:      ?Height:      ? ? ?Intake/Output Summary (Last 24 hours) at 01/03/2022 0640 ?Last data filed at 01/03/2022 0500 ?Gross per 24 hour  ?Intake 704.3 ml  ?Output 100 ml  ?Net 604.3 ml  ? ? ?Filed Weights  ? 12/27/21 1300  ?Weight: 67.8 kg  ? ? ?Examination: ? ?General exam: Appears calm & comfortable  ?Respiratory system: clear breath sounds b/l  ?Cardiovascular system: S1 & S2+. No rubs or gallops  ?Gastrointestinal system: Abd is soft, NT, ND & hypoactive bowel sounds  ?Central nervous system: Alert and oriented. Moves all extremities   ?Psychiatry: Judgement and insight appears normal. Flat mood and affect ? ? ? ?Data Reviewed: I have personally reviewed following labs and imaging studies ? ?CBC: ?Recent Labs  ?Lab 12/30/21 ?0611 12/31/21 ?0500 01/01/22 ?0446 01/02/22 ?8177 01/03/22 ?1165  ?WBC 11.3* 7.4 10.4 14.3* 16.7*  ?HGB 9.3* 9.0* 9.0* 8.9* 8.3*  ?HCT 29.9* 29.0* 29.2* 28.7* 27.1*  ?MCV 85.7 85.0 84.1 84.4 85.0  ?PLT 275 293  333 349 321  ? ?Basic Metabolic Panel: ?Recent Labs  ?Lab 12/31/21 ?0500 12/31/21 ?1301 01/01/22 ?0446 01/01/22 ?7903 01/01/22 ?1032 01/01/22 ?2331 01/02/22 ?8333 01/02/22 ?1047 01/02/22 ?1428 01/02/22 ?2227 01/03/22 ?8329  ?NA 131*  --  131* 130*  --   --  127*  --   --   --  129*  ?K 5.7*   < > 6.4* 6.5*   < > 5.7* 5.5* 5.1 5.0  --  6.1*  ?CL 100  --  100 100  --   --  94*  --   --   --  98  ?CO2 21*  --  22 22  --   --  22  --   --   --  21*  ?GLUCOSE 277*  --  352* 361*  --  509* 527*  --   --  497* 454*  ?BUN 47*  --  58* 60*  --   --  71*  --   --   --  82*  ?CREATININE 3.08*  --  3.16* 3.26*  --   --  3.58*  --   --   --  3.87*  ?CALCIUM 8.7*  --  8.3* 8.4*  --   --  8.0*  --   --   --  8.4*  ? < > = values in this interval not displayed.  ? ?GFR: ?Estimated Creatinine Clearance: 14.5 mL/min (A) (by C-G formula based on SCr of 3.87 mg/dL (H)). ?Liver Function Tests: ?Recent Labs  ?Lab 12/28/21 ?0611  ?AST 11*  ?ALT 6  ?ALKPHOS 95  ?BILITOT 0.3  ?PROT 6.7  ?ALBUMIN 2.6*  ? ?No results for input(s): LIPASE, AMYLASE in the last 168 hours. ?No results for input(s): AMMONIA in the last 168 hours. ?Coagulation Profile: ?No results for input(s): INR, PROTIME in the last 168 hours. ? ?Cardiac Enzymes: ?No results for input(s): CKTOTAL, CKMB, CKMBINDEX, TROPONINI in the last 168 hours. ?BNP (last 3 results) ?No results for input(s): PROBNP in the last 8760 hours. ?HbA1C: ?No results for input(s): HGBA1C in the last 72 hours. ? ?CBG: ?Recent Labs  ?Lab 01/02/22 ?0358 01/02/22 ?6237 01/02/22 ?1209 01/02/22 ?1552 01/02/22 ?2114  ?GLUCAP [place] ?Lipid Profile: ?No results for input(s): CHOL, HDL, LDLCALC, TRIG, CHOLHDL, LDLDIRECT in the last 72 hours. ? ?Thyroid Function Tests: ?No results for input(s): TSH, T4TOTAL, FREET4, T3FREE, THYROIDAB in the last 72 hours. ?Anemia Panel: ?Recent Labs  ?  01/01/22 ?1032  ?VITAMINB12 522  ?FOLATE 5.4*  ?FERRITIN 143  ?TIBC 239*  ?IRON 79  ? ? ?Sepsis  Labs: ?Recent Labs  ?Lab 12/28/21 ?0611  ?PROCALCITON 0.43  ? ? ?Recent Results (from the past 240 hour(s))  ?Respiratory (~20 pathogens) panel by PCR     Status: Abnormal  ? Collection Time: 12/27/21 11:32 AM  ? Sp

## 2022-01-03 NOTE — Progress Notes (Signed)
PT Cancellation Note ? ?Patient Details ?Name: Deanna [name] ?MRN: [id number] ?DOB: Jan 24, 1951 ? ? ?Cancelled Treatment:    Reason Eval/Treat Not Completed: Medical issues which prohibited therapy. Pt chart reviewed. Pt continues to have BG > 500 recorded at 535 at 12:32 p.m. Treatment at this time contraindicated. Will continue to monitor and treat as medically appropriate. ? ? ?Salem Caster. Fairly IV, PT, DPT ?Physical Therapist- [place]  ?[facility]  ?01/03/2022, 12:52 PM ?

## 2022-01-03 NOTE — Progress Notes (Signed)
Initial Nutrition Assessment ? ?DOCUMENTATION CODES:  ? ?Not applicable ? ?INTERVENTION:  ? ?-Dlc Glucerna shake  ?-Nepro Shake po daily, each supplement provides 425 kcal and 19 grams protein  ?-MVI with minerals daily ? ?NUTRITION DIAGNOSIS:  ? ?Inadequate oral intake related to poor appetite, early satiety as evidenced by per patient/family report. ? ?Ongoing ? ?GOAL:  ? ?Patient will meet greater than or equal to 90% of their needs ? ?Progressing ? ?MONITOR:  ? ?PO intake, Supplement acceptance, Diet advancement, Labs, Weight trends ? ?REASON FOR ASSESSMENT:  ? ?Consult ?Assessment of nutrition requirement/status ? ?ASSESSMENT:  ? ?Pt admitted from home with R sided weakness, admitted to r/o stroke. PMH significant for HTN and CAD with recent stents. ? ?4/5- s/p MRI of cervical spine- revealed severe neuroforaminal stenosis at the right C5/C6 nerve ? ?Reviewed I/O's: +604 ml x 24 hours and +2.2 L since admission ? ?UOP: 100 ml x 24 hours ? ?Pt sleeping soundly at time of visit. She did not arouse to voice. Noted completed Ensure at bedside. Pt has been consuming 75-100% of meals. ? ?Per nephrology notes, renal biopsy being considered. Pt on lokelma due to high potassium ? ?Medications reviewed and include prednisone.  ? ?Labs reviewed: Na: 129, K: 6.1, CBGS: 427-534 (inpatient orders for glycemic control are 0-20 units insulin aspart TID with meals, 0-5 units insulin aspart daily at bedtime, and 25 units insulin glargine-yfgn daily).   ? ?Diet Order:   ?Diet Order   ? ?       ?  Diet Carb Modified Fluid consistency: Thin; Room service appropriate? Yes  Diet effective now       ?  ? ?  ?  ? ?  ? ? ?EDUCATION NEEDS:  ? ?No education needs have been identified at this time ? ?Skin:  Skin Assessment: Reviewed RN Assessment ? ?Last BM:  01/03/22 ? ?Height:  ? ?Ht Readings from Last 1 Encounters:  ?12/27/21 '6\' 1"'$  (1.854 m)  ? ? ?Weight:  ? ?Wt Readings from Last 1 Encounters:  ?12/27/21 67.8 kg  ? ?BMI:  Body mass  index is 19.71 kg/m?. ? ?Estimated Nutritional Needs:  ? ?Kcal:  1700-1900 ? ?Protein:  85-100g ? ?Fluid:  >/=1.7L ? ? ? ?Loistine Chance, RD, LDN, CDCES ?Registered Dietitian II ?Certified Diabetes Care and Education Specialist ?Please refer to [facility] for RD and/or RD on-call/weekend/after hours pager  ?

## 2022-01-03 NOTE — Progress Notes (Signed)
?[facility]  ?ROUNDING NOTE  ? ?Subjective:  ? ?Patient seen resting quietly, alert and oriented ?Poor appetite remains, patient states he continues to eat at least half of meals because he knows he needs to eat some. ?Remains on room air, denies pain or discomfort ? ?Creatinine increased 3.87 ?BUN 82 ?Sodium 129 ?Potassium 6.1 ? ?Objective:  ?Vital signs in last 24 hours:  ?Temp:  [97.4 ?F (36.3 ?C)-98.4 ?F (36.9 ?C)] 98.3 ?F (36.8 ?C) (04/11 1230) ?Pulse Rate:  [80-96] 96 (04/11 1230) ?Resp:  [15-18] 17 (04/11 1230) ?BP: (142-169)/(65-84) 147/65 (04/11 1230) ?SpO2:  [98 %-100 %] 98 % (04/11 1230) ? ?Weight change:  ?Filed Weights  ? 12/27/21 1300  ?Weight: 67.8 kg  ? ? ?Intake/Output: ?I/O last 3 completed shifts: ?In: 704.3 [P.O.:240; I.V.:464.3] ?Out: 500 [Urine:500] ?  ?Intake/Output this shift: ? No intake/output data recorded. ? ?Physical Exam: ?General: NAD, laying in bed  ?Head: Normocephalic, atraumatic. Moist oral mucosal membranes  ?Eyes: Anicteric  ?Lungs:  Clear to auscultation, normal effort  ?Heart: Regular rate and rhythm  ?Abdomen:  Soft, nontender  ?Extremities:  No peripheral edema.  ?Neurologic: Nonfocal, moving all four extremities  ?Skin: No lesions  ?Access: none  ? ? ?Basic Metabolic Panel: ?Recent Labs  ?Lab 12/31/21 ?0500 12/31/21 ?1301 01/01/22 ?0446 01/01/22 ?1478 01/01/22 ?1032 01/01/22 ?2331 01/02/22 ?2956 01/02/22 ?1047 01/02/22 ?1428 01/02/22 ?2227 01/03/22 ?2130  ?NA 131*  --  131* 130*  --   --  127*  --   --   --  129*  ?K 5.7*   < > 6.4* 6.5*   < > 5.7* 5.5* 5.1 5.0  --  6.1*  ?CL 100  --  100 100  --   --  94*  --   --   --  98  ?CO2 21*  --  22 22  --   --  22  --   --   --  21*  ?GLUCOSE 277*  --  352* 361*  --  509* 527*  --   --  497* 454*  ?BUN 47*  --  58* 60*  --   --  71*  --   --   --  82*  ?CREATININE 3.08*  --  3.16* 3.26*  --   --  3.58*  --   --   --  3.87*  ?CALCIUM 8.7*  --  8.3* 8.4*  --   --  8.0*  --   --   --  8.4*  ? < > = values in this  interval not displayed.  ? ? ? ?Liver Function Tests: ?Recent Labs  ?Lab 12/28/21 ?0611  ?AST 11*  ?ALT 6  ?ALKPHOS 95  ?BILITOT 0.3  ?PROT 6.7  ?ALBUMIN 2.6*  ? ? ?No results for input(s): LIPASE, AMYLASE in the last 168 hours. ?No results for input(s): AMMONIA in the last 168 hours. ? ?CBC: ?Recent Labs  ?Lab 12/30/21 ?0611 12/31/21 ?0500 01/01/22 ?0446 01/02/22 ?8657 01/03/22 ?8469  ?WBC 11.3* 7.4 10.4 14.3* 16.7*  ?HGB 9.3* 9.0* 9.0* 8.9* 8.3*  ?HCT 29.9* 29.0* 29.2* 28.7* 27.1*  ?MCV 85.7 85.0 84.1 84.4 85.0  ?PLT 275 293 333 349 321  ? ? ? ?Cardiac Enzymes: ?No results for input(s): CKTOTAL, CKMB, CKMBINDEX, TROPONINI in the last 168 hours. ? ?BNP: ?Invalid input(s): POCBNP ? ?CBG: ?Recent Labs  ?Lab 01/02/22 ?1209 01/02/22 ?1552 01/02/22 ?2114 01/03/22 ?0755 01/03/22 ?1232  ?GLUCAP [place] ? ?  Microbiology: ?Results for orders placed or performed during the hospital encounter of 12/26/21  ?Respiratory (~20 pathogens) panel by PCR     Status: Abnormal  ? Collection Time: 12/27/21 11:32 AM  ? Specimen: Nasopharyngeal Swab; Respiratory  ?Result Value Ref Range Status  ? Adenovirus NOT DETECTED NOT DETECTED Final  ? Coronavirus 229E NOT DETECTED NOT DETECTED Final  ?  Comment: (NOTE) ?The Coronavirus on the Respiratory Panel, DOES NOT test for the novel  ?Coronavirus (2019 nCoV) ?  ? Coronavirus HKU1 NOT DETECTED NOT DETECTED Final  ? Coronavirus NL63 NOT DETECTED NOT DETECTED Final  ? Coronavirus OC43 NOT DETECTED NOT DETECTED Final  ? Metapneumovirus NOT DETECTED NOT DETECTED Final  ? Rhinovirus / Enterovirus NOT DETECTED NOT DETECTED Final  ? Influenza A NOT DETECTED NOT DETECTED Final  ? Influenza B NOT DETECTED NOT DETECTED Final  ? Parainfluenza Virus 1 NOT DETECTED NOT DETECTED Final  ? Parainfluenza Virus 2 NOT DETECTED NOT DETECTED Final  ? Parainfluenza Virus 3 DETECTED (A) NOT DETECTED Final  ? Parainfluenza Virus 4 NOT DETECTED NOT DETECTED Final  ? Respiratory Syncytial Virus NOT  DETECTED NOT DETECTED Final  ? Bordetella pertussis NOT DETECTED NOT DETECTED Final  ? Bordetella Parapertussis NOT DETECTED NOT DETECTED Final  ? Chlamydophila pneumoniae NOT DETECTED NOT DETECTED Final  ? Mycoplasma pneumoniae NOT DETECTED NOT DETECTED Final  ?  Comment: Performed at [facility], [place] [address] [place], [place] [address]  ?Resp Panel by RT-PCR (Flu A&B, Covid) Nasopharyngeal Swab     Status: None  ? Collection Time: 12/27/21 11:32 AM  ? Specimen: Nasopharyngeal Swab; Nasopharyngeal(NP) swabs in vial transport medium  ?Result Value Ref Range Status  ? SARS Coronavirus 2 by RT PCR NEGATIVE NEGATIVE Final  ?  Comment: (NOTE) ?SARS-CoV-2 target nucleic acids are NOT DETECTED. ? ?The SARS-CoV-2 RNA is generally detectable in upper respiratory ?specimens during the acute phase of infection. The lowest ?concentration of SARS-CoV-2 viral copies this assay can detect is ?138 copies/mL. A negative result does not preclude SARS-Cov-2 ?infection and should not be used as the sole basis for treatment or ?other patient management decisions. A negative result may occur with  ?improper specimen collection/handling, submission of specimen other ?than nasopharyngeal swab, presence of viral mutation(s) within the ?areas targeted by this assay, and inadequate number of viral ?copies(<138 copies/mL). A negative result must be combined with ?clinical observations, patient history, and epidemiological ?information. The expected result is Negative. ? ?Fact Sheet for Patients:  ?EntrepreneurPulse.com.au ? ?Fact Sheet for Healthcare Providers:  ?IncredibleEmployment.be ? ?This test is no t yet approved or cleared by the [place] FDA and  ?has been authorized for detection and/or diagnosis of SARS-CoV-2 by ?FDA under an Emergency Use Authorization (EUA). This EUA will remain  ?in effect (meaning this test can be used) for the duration of the ?COVID-19 declaration under Section  564(b)(1) of the Act, 21 ?U.S.C.section 360bbb-3(b)(1), unless the authorization is terminated  ?or revoked sooner.  ? ? ?  ? Influenza A by PCR NEGATIVE NEGATIVE Final  ? Influenza B by PCR NEGATIVE NEGATIVE Final  ?  Comment: (NOTE) ?The Xpert Xpress SARS-CoV-2/FLU/RSV plus assay is intended as an aid ?in the diagnosis of influenza from Nasopharyngeal swab specimens and ?should not be used as a sole basis for treatment. Nasal washings and ?aspirates are unacceptable for Xpert Xpress SARS-CoV-2/FLU/RSV ?testing. ? ?Fact Sheet for Patients: ?EntrepreneurPulse.com.au ? ?Fact Sheet for Healthcare Providers: ?IncredibleEmployment.be ? ?This test is not yet approved  or cleared by the [place] and ?has been authorized for detection and/or diagnosis of SARS-CoV-2 by ?FDA under an Emergency Use Authorization (EUA). This EUA will remain ?in effect (meaning this test can be used) for the duration of the ?COVID-19 declaration under Section 564(b)(1) of the Act, 21 U.S.C. ?section 360bbb-3(b)(1), unless the authorization is terminated or ?revoked. ? ?Performed at [facility], [place], ?[place] [address] ?  ? ? ?Coagulation Studies: ?No results for input(s): LABPROT, INR in the last 72 hours. ? ?Urinalysis: ?No results for input(s): COLORURINE, LABSPEC, [place], GLUCOSEU, HGBUR, BILIRUBINUR, KETONESUR, PROTEINUR, UROBILINOGEN, NITRITE, LEUKOCYTESUR in the last 72 hours. ? ?Invalid input(s): APPERANCEUR  ? ? ?Imaging: ?US RENAL ? ?Result Date: 01/01/2022 ?CLINICAL DATA:  Acute tubular necrosis EXAM: RENAL / URINARY TRACT ULTRASOUND COMPLETE COMPARISON:  None. FINDINGS: Right Kidney: Renal measurements: 9.6 x 4.1 x 4.3 cm = volume: 88 mL. Echogenicity within normal limits. No mass or hydronephrosis visualized. Left Kidney: Renal measurements: 9.8 x 4.6 x 4.4 cm = volume: 103 mL. Echogenicity within normal limits. No mass or hydronephrosis visualized. Bladder:  Appears normal for degree of bladder distention. Other: None. IMPRESSION: No ultrasound abnormality of the kidneys.  No hydronephrosis. Electronically Signed   By: Delanna Ahmadi M.D.   On: 01/01/2022 14:06

## 2022-01-04 ENCOUNTER — Inpatient Hospital Stay
Admission: EM | Disposition: A | Discharge status: Home Health | Payer: Self-pay | Source: Home / Self Care | Attending: Internal Medicine

## 2022-01-04 ENCOUNTER — Encounter: Payer: Self-pay | Admitting: Vascular Surgery

## 2022-01-04 DIAGNOSIS — R739 Hyperglycemia, unspecified: Secondary | ICD-10-CM

## 2022-01-04 DIAGNOSIS — R531 Weakness: Secondary | ICD-10-CM | POA: Diagnosis not present

## 2022-01-04 HISTORY — PX: TEMPORARY DIALYSIS CATHETER: CATH118312

## 2022-01-04 LAB — BASIC METABOLIC PANEL
Anion gap: 10 (ref 5–15)
BUN: 90 mg/dL — ABNORMAL HIGH (ref 8–23)
CO2: 20 mmol/L — ABNORMAL LOW (ref 22–32)
Calcium: 8.3 mg/dL — ABNORMAL LOW (ref 8.9–10.3)
Chloride: 96 mmol/L — ABNORMAL LOW (ref 98–111)
Creatinine, Ser: 4.29 mg/dL — ABNORMAL HIGH (ref 0.44–1.00)
GFR, Estimated: 11 mL/min — ABNORMAL LOW (ref >60)
Glucose, Bld: 419 mg/dL — ABNORMAL HIGH (ref 70–99)
Potassium: 6.7 mmol/L (ref 3.5–5.1)
Sodium: 126 mmol/L — ABNORMAL LOW (ref 135–145)

## 2022-01-04 LAB — CREATININE, SERUM
Creatinine, Ser: 4.5 mg/dL — ABNORMAL HIGH (ref 0.44–1.00)
GFR, Estimated: 10 mL/min — ABNORMAL LOW (ref >60)

## 2022-01-04 LAB — GLUCOSE, CAPILLARY
Glucose-Capillary: 454 mg/dL — ABNORMAL HIGH (ref 70–99)
Glucose-Capillary: 413 mg/dL — ABNORMAL HIGH (ref 70–99)
Glucose-Capillary: 157 mg/dL — ABNORMAL HIGH (ref 70–99)
Glucose-Capillary: 167 mg/dL — ABNORMAL HIGH (ref 70–99)

## 2022-01-04 LAB — CBC
HCT: 26.3 % — ABNORMAL LOW (ref 36.0–46.0)
Hemoglobin: 8.1 g/dL — ABNORMAL LOW (ref 12.0–15.0)
MCH: 26 pg (ref 26.0–34.0)
MCHC: 30.8 g/dL (ref 30.0–36.0)
MCV: 84.6 fL (ref 80.0–100.0)
Platelets: 338 10*3/uL (ref 150–400)
RBC: 3.11 MIL/uL — ABNORMAL LOW (ref 3.87–5.11)
RDW: 14.4 % (ref 11.5–15.5)
WBC: 15.7 10*3/uL — ABNORMAL HIGH (ref 4.0–10.5)
nRBC: 0 % (ref 0.0–0.2)

## 2022-01-04 LAB — POTASSIUM: Potassium: 5.5 mmol/L — ABNORMAL HIGH (ref 3.5–5.1)

## 2022-01-04 LAB — BUN: BUN: 101 mg/dL — ABNORMAL HIGH (ref 8–23)

## 2022-01-04 SURGERY — TEMPORARY DIALYSIS CATHETER
Anesthesia: Moderate Sedation

## 2022-01-04 MED ORDER — INSULIN ASPART 100 UNIT/ML IJ SOLN
4.0000 [IU] | Freq: Three times a day (TID) | INTRAMUSCULAR | Status: DC
  Administered 2022-01-04 – 2022-01-12 (×13): 4 [IU] via SUBCUTANEOUS
  Filled 2022-01-04 (×10): qty 1

## 2022-01-04 MED ORDER — ONDANSETRON HCL 4 MG/2ML IJ SOLN
4.0000 mg | Freq: Four times a day (QID) | INTRAMUSCULAR | Status: DC | PRN
Start: 2022-01-04 — End: 2022-01-09
  Administered 2022-01-07: 4 mg via INTRAVENOUS
  Filled 2022-01-04: qty 2

## 2022-01-04 MED ORDER — HEPARIN SODIUM (PORCINE) 1000 UNIT/ML DIALYSIS
1000.0000 [IU] | INTRAMUSCULAR | Status: DC | PRN
  Filled 2022-01-04: qty 1

## 2022-01-04 MED ORDER — LIDOCAINE-PRILOCAINE 2.5-2.5 % EX CREA
1.0000 "application " | TOPICAL_CREAM | CUTANEOUS | Status: DC | PRN
  Filled 2022-01-04: qty 5

## 2022-01-04 MED ORDER — LIDOCAINE HCL (PF) 1 % IJ SOLN
5.0000 mL | INTRAMUSCULAR | Status: DC | PRN
  Filled 2022-01-04: qty 5

## 2022-01-04 MED ORDER — FAMOTIDINE 20 MG PO TABS: 40.0000 mg | ORAL_TABLET | Freq: Once | ORAL | Status: DC | PRN

## 2022-01-04 MED ORDER — MIDAZOLAM HCL 2 MG/ML PO SYRP: 8.0000 mg | ORAL_SOLUTION | Freq: Once | ORAL | Status: DC | PRN

## 2022-01-04 MED ORDER — METHYLPREDNISOLONE SODIUM SUCC 125 MG IJ SOLR: 125.0000 mg | Freq: Once | INTRAMUSCULAR | Status: DC | PRN

## 2022-01-04 MED ORDER — CHLORHEXIDINE GLUCONATE CLOTH 2 % EX PADS
6.0000 | MEDICATED_PAD | Freq: Every day | CUTANEOUS | Status: DC
  Administered 2022-01-05 – 2022-01-13 (×9): 6 via TOPICAL

## 2022-01-04 MED ORDER — SODIUM ZIRCONIUM CYCLOSILICATE 10 G PO PACK: 10.0000 g | PACK | Freq: Two times a day (BID) | ORAL | Status: DC

## 2022-01-04 MED ORDER — RENA-VITE PO TABS
1.0000 | ORAL_TABLET | Freq: Every day | ORAL | Status: DC
  Administered 2022-01-05 – 2022-01-12 (×8): 1 via ORAL
  Filled 2022-01-04 (×9): qty 1

## 2022-01-04 MED ORDER — HEPARIN SODIUM (PORCINE) 1000 UNIT/ML IJ SOLN
INTRAMUSCULAR | Status: AC
  Filled 2022-01-04: qty 10

## 2022-01-04 MED ORDER — PENTAFLUOROPROP-TETRAFLUOROETH EX AERO: 1.0000 "application " | INHALATION_SPRAY | CUTANEOUS | Status: DC | PRN

## 2022-01-04 MED ORDER — ALTEPLASE 2 MG IJ SOLR
2.0000 mg | Freq: Once | INTRAMUSCULAR | Status: DC | PRN
  Filled 2022-01-04: qty 2

## 2022-01-04 MED ORDER — VANCOMYCIN HCL IN DEXTROSE 1-5 GM/200ML-% IV SOLN: 1000.0000 mg | INTRAVENOUS | Status: DC

## 2022-01-04 MED ORDER — SODIUM CHLORIDE 0.9 % IV SOLN: 100.0000 mL | INTRAVENOUS | Status: DC | PRN

## 2022-01-04 MED ORDER — SODIUM CHLORIDE 0.9 % IV SOLN: INTRAVENOUS | Status: DC

## 2022-01-04 MED ORDER — DIPHENHYDRAMINE HCL 50 MG/ML IJ SOLN: 50.0000 mg | Freq: Once | INTRAMUSCULAR | Status: DC | PRN

## 2022-01-04 MED ORDER — HYDROMORPHONE HCL 1 MG/ML IJ SOLN: 1.0000 mg | Freq: Once | INTRAMUSCULAR | Status: DC | PRN

## 2022-01-04 SURGICAL SUPPLY — 1 items: KIT DIALYSIS CATH TRI 30X13 (CATHETERS) ×1 IMPLANT

## 2022-01-04 NOTE — Progress Notes (Signed)
Nutrition Follow-up ? ?DOCUMENTATION CODES:  ? ?Not applicable ? ?INTERVENTION:  ? ?-Continue Nepro Shake po daily, each supplement provides 425 kcal and 19 grams protein  ?-D/c MVI with minerals daily ?-Renal MVI daily ? ?NUTRITION DIAGNOSIS:  ? ?Inadequate oral intake related to poor appetite, early satiety as evidenced by per patient/family report. ? ?Ongoing ? ?GOAL:  ? ?Patient will meet greater than or equal to 90% of their needs ? ?Progressing  ? ?MONITOR:  ? ?PO intake, Supplement acceptance, Diet advancement, Labs, Weight trends ? ?REASON FOR ASSESSMENT:  ? ?Consult ?Assessment of nutrition requirement/status ? ?ASSESSMENT:  ? ?Pt admitted from home with R sided weakness, admitted to r/o stroke. PMH significant for HTN and CAD with recent stents. ? ?Reviewed I/O's: +288 ml x 24 hours and +2.5 L since admission  ? ?Per nephrology notes, renal biopsy being considered. ? ?Pt tearful at time of visit. She reports that she just received some bad new from MD. RD actively listened to pt concerns and provided environment for pt to express her feeling. RD provided emotional support and validated her feelings. She shares that she thinks she is receiving mixed messages from providers and that the plan is for her to start HD today, which she hopes is short term. ? ?Pt reports fair appetite. Noted meal completions 75-100%. Pt is drinking supplements.  ? ?Noted plan to place HD cath with vascular surgery today.  ? ?Medications reviewed and include veltassa.  ? ?Labs reviewed: Na: 126, K: 6.7, CBGS: 347-413 (inpatient orders for glycemic control are 0-20 units insulin aspart TID with meals, 0-5 units insulin aspart daily at bedtime, 4 units insulin aspart TID with meals, and 35 units insulin glargine-yfgn daily at bedtime).   ? ?NUTRITION - FOCUSED PHYSICAL EXAM: ? ?Flowsheet Row Most Recent Value  ?Orbital Region No depletion  ?Upper Arm Region No depletion  ?Thoracic and Lumbar Region No depletion  ?Buccal Region No  depletion  ?Temple Region No depletion  ?Clavicle Bone Region No depletion  ?Clavicle and Acromion Bone Region No depletion  ?Scapular Bone Region No depletion  ?Dorsal Hand No depletion  ?Patellar Region No depletion  ?Anterior Thigh Region No depletion  ?Posterior Calf Region No depletion  ?Edema (RD Assessment) Mild  ?Hair Reviewed  ?Eyes Reviewed  ?Mouth Reviewed  ?Skin Reviewed  ?Nails Reviewed  ? ?  ? ? ?Diet Order:   ?Diet Order   ? ?       ?  Diet NPO time specified  Diet effective midnight       ?  ?  Diet Carb Modified Fluid consistency: Thin; Room service appropriate? Yes  Diet effective now       ?  ? ?  ?  ? ?  ? ? ?EDUCATION NEEDS:  ? ?No education needs have been identified at this time ? ?Skin:  Skin Assessment: Reviewed RN Assessment ? ?Last BM:  01/03/22 ? ?Height:  ? ?Ht Readings from Last 1 Encounters:  ?12/27/21 '6\' 1"'$  (1.854 m)  ? ? ?Weight:  ? ?Wt Readings from Last 1 Encounters:  ?12/27/21 67.8 kg  ? ?BMI:  Body mass index is 19.71 kg/m?. ? ?Estimated Nutritional Needs:  ? ?Kcal:  1850-2050 ? ?Protein:  100-115 grams ? ?Fluid:  1000 ml + UOP ? ? ? ?Loistine Chance, RD, LDN, CDCES ?Registered Dietitian II ?Certified Diabetes Care and Education Specialist ?Please refer to [facility] for RD and/or RD on-call/weekend/after hours pager  ?

## 2022-01-04 NOTE — Progress Notes (Addendum)
?PROGRESS NOTE ? ?Deanna [name] NAT:[id number] DOB: 1950/10/19 DOA: 12/26/2021 ?PCP: [facility] [place] ? ? LOS: 7 days  ? ?Brief Narrative / Interim history: ?71 year old female with past medical history of CAD, hypertension and chronic kidney disease stage IV.  She presents with right-sided numbness and weakness. MRI of the brain shows a punctate focus of reduced diffusion within the right superior pericallosal white matter.  Case discussed with neurology and this likely would not be causing her right-sided weakness so MRI of the cervical and thoracic spine ordered an MRA of the brain ordered. While I was in the room the patient had a vasovagal event where she got dizzy and lightheaded and was seeing spots.  This resolved quickly.  The patient was not orthostatic before this and blood pressure was okay during the events. ? ?Subjective / 24h Interval events: ?No complaints this morning.  ? ?Assesement and Plan: ?Principal Problem: ?  Right sided weakness ?Active Problems: ?  Acute CVA (cerebrovascular accident) ([place]) ?  Acute kidney injury superimposed on chronic kidney disease ([place]) ?  Lobar pneumonia ([place]) ?  Essential (primary) hypertension ?  HFrEF (heart failure with reduced ejection fraction) ([place]) ?  Type 2 diabetes mellitus without complications ([place]) ?  Hypokalemia ?  Anemia ?  CVA (cerebral vascular accident) [facility]) ? ? ?Assessment and Plan: ?Principal problem ?Right sided weakness -Right-sided weakness not explained by MRI of the brain.  She underwent a C-spine MRI which showed mild spinal canal stenosis and moderate right neuroforaminal stenosis at C6-7, severe right C5-6 neuroforaminal stenosis.  Neurology and neurosurgery evaluated, it is not felt to be related to C-spine, and this could be further addressed as an outpatient.  Overall improving ? ?Active problems ?Acute CVA (cerebrovascular accident) [facility]) -neurology consulted and followed patient while hospitalized.  Underwent a  full work-up, currently recommending aspirin and Plavix for 21 days and aspirin alone.  She is to continue on statin. ? ?Lobar pneumonia ([place]) -Right lobar pneumonia.  Completed ceftriaxone and azithromycin. Parainfluenza positive ? ?Acute kidney injury superimposed on chronic kidney disease ([place]) -Acute kidney injury on chronic kidney disease stage IV.  Nephrology following, due to hyperkalemia she is starting on HD today  ? ?Anemia -due to CKD. No bleeding, monitor ? ?Hypokalemia / hyperkalemia -Potassium 3.2 on presentation, now persistently hyperkalemic despite treatment. To start HD per nephrology  ? ?Type 2 diabetes mellitus without complications ([facility]) -continue glargine, add novolog ? ?CBG (last 3)  ?Recent Labs  ?  01/03/22 ?2146 01/04/22 ?4270 01/04/22 ?1213  ?GLUCAP [place] ?HFrEF (heart failure with reduced ejection fraction) ([place]) -Patient is euvolemic at this point.  With acute kidney injury holding diuretics. EF 30-35% ? ?Essential (primary) hypertension -continue toprol ? ?Scheduled Meds: ? [MAR Hold]  stroke: early stages of recovery book   Does not apply Once  ? [MAR Hold] aspirin EC  81 mg Oral Daily  ? [MAR Hold] atorvastatin  80 mg Oral QPM  ? [MAR Hold] clopidogrel  75 mg Oral Daily  ? [MAR Hold] feeding supplement (NEPRO CARB STEADY)  237 mL Oral QHS  ? [MAR Hold] gabapentin  300 mg Oral QHS  ? [MAR Hold] heparin  5,000 Units Subcutaneous Q8H  ? [MAR Hold] insulin aspart  0-20 Units Subcutaneous TID WC  ? [MAR Hold] insulin aspart  0-5 Units Subcutaneous QHS  ? insulin aspart  4 Units Subcutaneous TID WC  ? [MAR Hold] insulin glargine-yfgn  35 Units Subcutaneous Daily  ? [  MAR Hold] metoprolol succinate  25 mg Oral QHS  ? [MAR Hold] multivitamin with minerals  1 tablet Oral Daily  ? [MAR Hold] patiromer  16.8 g Oral Daily  ? ?Continuous Infusions: ? sodium chloride 50 mL/hr at 01/02/22 1830  ? sodium chloride    ? vancomycin    ? ?PRN Meds:.[MAR Hold] acetaminophen **OR** [MAR Hold]  acetaminophen (TYLENOL) oral liquid 160 mg/5 mL **OR** [MAR Hold] acetaminophen, [MAR Hold] benzonatate, diphenhydrAMINE, famotidine, [MAR Hold] guaiFENesin-dextromethorphan, [MAR Hold]  HYDROmorphone (DILAUDID) injection, methylPREDNISolone (SOLU-MEDROL) injection, midazolam, [MAR Hold]  morphine injection, [MAR Hold] ondansetron (ZOFRAN) IV, [MAR Hold] ondansetron (ZOFRAN) IV, [MAR Hold] oxyCODONE ? ?Diet Orders (From admission, onward)  ? ?  Start     Ordered  ? 01/05/22 0001  Diet NPO time specified  Diet effective midnight       ? 01/04/22 1204  ? 01/02/22 0733  Diet Carb Modified Fluid consistency: Thin; Room service appropriate? Yes  Diet effective now       ?Question Answer Comment  ?Diet-HS Snack? Nothing   ?Calorie Level Medium 1600-2000   ?Fluid consistency: Thin   ?Room service appropriate? Yes   ?  ? 01/02/22 0732  ? ?  ?  ? ?  ? ? ?DVT prophylaxis: Place and maintain sequential compression device Start: 12/28/21 1332 ?heparin injection 5,000 Units Start: 12/27/21 0200 ? ? ?Lab Results  ?Component Value Date  ? PLT 338 01/04/2022  ? ? ?  Code Status: Full Code ? ?Family Communication: no family at bedside  ? ?Status is: Inpatient ? ?Remains inpatient appropriate because: needs HD ? ?Level of care: Med-Surg ? ?Consultants:  ?Nephrology  ?Neurology ?Neurosurgery  ? ? ?Objective: ?Vitals:  ? 01/04/22 0504 01/04/22 0858 01/04/22 1247 01/04/22 1336  ?BP: 136/61 (!) 144/70 (!) 147/67 135/62  ?Pulse: 85 89 87 83  ?Resp: '16 19 18   '$ ?Temp: 97.7 ?F (36.5 ?C)     ?TempSrc:      ?SpO2: 99% 98% 99% 97%  ?Weight:      ?Height:      ? ? ?Intake/Output Summary (Last 24 hours) at 01/04/2022 1406 ?Last data filed at 01/03/2022 1500 ?Gross per 24 hour  ?Intake 287.98 ml  ?Output --  ?Net 287.98 ml  ? ?Wt Readings from Last 3 Encounters:  ?12/27/21 67.8 kg  ?12/09/17 83.9 kg  ?03/01/16 78.5 kg  ? ? ?Examination: ? ?Constitutional: NAD ?Eyes: no scleral icterus ?ENMT: Mucous membranes are moist.  ?Neck: normal,  supple ?Respiratory: clear to auscultation bilaterally, no wheezing, no crackles.  ?Cardiovascular: Regular rate and rhythm, no murmurs / rubs / gallops.  ?Abdomen: non distended, no tenderness. Bowel sounds positive.  ?Musculoskeletal: no clubbing / cyanosis.  ?Skin: no rashes ?Neurologic: non focal   ? ? ?Data Reviewed: I have independently reviewed following labs and imaging studies  ? ?CBC ?Recent Labs  ?Lab 12/31/21 ?0500 01/01/22 ?0446 01/02/22 ?1610 01/03/22 ?9604 01/04/22 ?0422  ?WBC 7.4 10.4 14.3* 16.7* 15.7*  ?HGB 9.0* 9.0* 8.9* 8.3* 8.1*  ?HCT 29.0* 29.2* 28.7* 27.1* 26.3*  ?PLT 293 333 349 321 338  ?MCV 85.0 84.1 84.4 85.0 84.6  ?MCH 26.4 25.9* 26.2 26.0 26.0  ?MCHC 31.0 30.8 31.0 30.6 30.8  ?RDW 14.1 13.9 14.1 14.1 14.4  ? ? ?Recent Labs  ?Lab 01/01/22 ?0446 01/01/22 ?5409 01/01/22 ?1032 01/01/22 ?2331 01/02/22 ?8119 01/02/22 ?1047 01/02/22 ?1428 01/02/22 ?2227 01/03/22 ?1478 01/04/22 ?0422 01/04/22 ?1225  ?NA 131* 130*  --   --  127*  --   --   --  129* 126*  --   ?K 6.4* 6.5*   < > 5.7* 5.5* 5.1 5.0  --  6.1* 6.7*  --   ?CL 100 100  --   --  94*  --   --   --  98 96*  --   ?CO2 22 22  --   --  22  --   --   --  21* 20*  --   ?GLUCOSE 352* 361*  --  509* 527*  --   --  497* 454* 419*  --   ?BUN 58* 60*  --   --  71*  --   --   --  82* 90* 101*  ?CREATININE 3.16* 3.26*  --   --  3.58*  --   --   --  3.87* 4.29* 4.50*  ?CALCIUM 8.3* 8.4*  --   --  8.0*  --   --   --  8.4* 8.3*  --   ? < > = values in this interval not displayed.  ? ? ?------------------------------------------------------------------------------------------------------------------ ?No results for input(s): CHOL, HDL, LDLCALC, TRIG, CHOLHDL, LDLDIRECT in the last 72 hours. ? ?Lab Results  ?Component Value Date  ? HGBA1C 5.5 12/27/2021  ? ?------------------------------------------------------------------------------------------------------------------ ?No results for input(s): TSH, T4TOTAL, T3FREE, THYROIDAB in the last 72  hours. ? ?Invalid input(s): FREET3 ? ?Cardiac Enzymes ?No results for input(s): CKMB, TROPONINI, MYOGLOBIN in the last 168 hours. ? ?Invalid input(s): CK ?-------------------------------------------------------------------------------

## 2022-01-04 NOTE — Progress Notes (Signed)
Hemodialysis Post Treatment Note ? ?04 January 2022  ? ?Access: Right Femoral Catheter  ? ?UF Removed: 0 ml ? ?Next Scheduled Treatment: 01/05/22  ? ?Note: ?Patient tolerated treatment without incident, goals of hemodialysis met. Blood flow rate maintained throughout course of treatment, targeted UF achieved. Patient initiates first treatment of a new Hemodialysis start. Vital signs remained stable throughout treatment, patient slept having had catheter placed earlier in the morning. Patient became nauseated at the end of treatment citing NPO status of the causative factor. No bleeding noted from insertion site. Patient will return on 01/05/22 for second treatment. Labs drawn per order, no med's given. Report given to primary nurse, patient transported to room assigned.  ?

## 2022-01-04 NOTE — Progress Notes (Signed)
PT Cancellation Note ? ?Patient Details ?Name: Deanna [name] ?MRN: [id number] ?DOB: 23-Aug-1951 ? ? ?Cancelled Treatment:    Reason Eval/Treat Not Completed: Medical issues which prohibited therapy. Pt's chart reviewed. Pt with critically high K+ value of 6.7 which is contraindicated for therapeutic intervention. PT to re-attempt when medically appropriate. ? ? ?Salem Caster. Fairly IV, PT, DPT ?Physical Therapist- [place]  ?[facility]  ?01/04/2022, 10:55 AM ?

## 2022-01-04 NOTE — Progress Notes (Signed)
OT Cancellation Note ? ?Patient Details ?Name: Deanna [name] ?MRN: [id number] ?DOB: Oct 21, 1950 ? ? ?Cancelled Treatment:    Reason Eval/Treat Not Completed: Medical issues which prohibited therapy. Pt with critically high potassium of 6.7 which is contraindicated for therapeutic intervention. OT to re-attempt when pt is next available to participate.  ? ?Darleen Crocker, [place], OTR/L , CBIS ?ascom [phone]  ?01/04/22, 8:55 AM  ?

## 2022-01-04 NOTE — Consult Note (Signed)
?[facility] ?Vascular Consult Note ? ?MRN : [id number] ? ?Deanna [name] is a 71 y.o. (07-09-1951) female who presents with chief complaint of  ?Chief Complaint  ?Patient presents with  ? Numbness  ?. ? ?History of Present Illness: Patient is admitted to the hospital with stroke symptoms and has developed acute renal failure. She has hyperkalemia. The patient reports shortness of breath and fatigue.  The patient is critically ill with a K of 6.7 and renal failure. The nephrology service has decided to initiate dialysis at this time, and we are asked to place a temporary dialysis catheter for immediate dialysis use.   ? ?Current Facility-Administered Medications  ?Medication Dose Route Frequency Provider Last Rate Last Admin  ? [MAR Hold]  stroke: mapping our early stages of recovery book   Does not apply Once Para Skeans, MD      ? 0.9 %  sodium chloride infusion   Intravenous Continuous Colon Flattery, NP 50 mL/hr at 01/02/22 1830 New Bag at 01/02/22 1830  ? 0.9 %  sodium chloride infusion   Intravenous Continuous Kris Hartmann, NP      ? [MAR Hold] acetaminophen (TYLENOL) tablet 650 mg  650 mg Oral Q4H PRN Para Skeans, MD   650 mg at 01/03/22 1255  ? Or  ? [MAR Hold] acetaminophen (TYLENOL) 160 MG/5ML solution 650 mg  650 mg Per Tube Q4H PRN Para Skeans, MD      ? Or  ? [MAR Hold] acetaminophen (TYLENOL) suppository 650 mg  650 mg Rectal Q4H PRN Para Skeans, MD      ? Doug Sou Hold] aspirin EC tablet 81 mg  81 mg Oral Daily Loletha Grayer, MD   81 mg at 01/04/22 0905  ? [MAR Hold] atorvastatin (LIPITOR) tablet 80 mg  80 mg Oral QPM Bhagat, Srishti L, MD   80 mg at 01/03/22 1759  ? [MAR Hold] benzonatate (TESSALON) capsule 200 mg  200 mg Oral TID PRN Wyvonnia Dusky, MD   200 mg at 01/04/22 1024  ? [MAR Hold] clopidogrel (PLAVIX) tablet 75 mg  75 mg Oral Daily Bhagat, Srishti L, MD   75 mg at 01/04/22 0905  ? diphenhydrAMINE (BENADRYL) injection 50 mg  50 mg Intravenous  Once PRN Kris Hartmann, NP      ? famotidine (PEPCID) tablet 40 mg  40 mg Oral Once PRN Kris Hartmann, NP      ? [MAR Hold] feeding supplement (NEPRO CARB STEADY) liquid 237 mL  237 mL Oral QHS Wyvonnia Dusky, MD   237 mL at 01/03/22 2200  ? [MAR Hold] gabapentin (NEURONTIN) capsule 300 mg  300 mg Oral QHS Wyvonnia Dusky, MD   300 mg at 01/03/22 2200  ? [MAR Hold] guaiFENesin-dextromethorphan (ROBITUSSIN DM) 100-10 MG/5ML syrup 5 mL  5 mL Oral Q4H PRN Wyvonnia Dusky, MD   5 mL at 12/30/21 0848  ? [MAR Hold] heparin injection 5,000 Units  5,000 Units Subcutaneous Q8H Para Skeans, MD   5,000 Units at 01/04/22 4709  ? [MAR Hold] HYDROmorphone (DILAUDID) injection 1 mg  1 mg Intravenous Once PRN Kris Hartmann, NP      ? [MAR Hold] insulin aspart (novoLOG) injection 0-20 Units  0-20 Units Subcutaneous TID WC Wyvonnia Dusky, MD   20 Units at 01/04/22 6283  ? [MAR Hold] insulin aspart (novoLOG) injection 0-5 Units  0-5 Units Subcutaneous QHS Loletha Grayer, MD   4 Units at  01/03/22 2200  ? insulin aspart (novoLOG) injection 4 Units  4 Units Subcutaneous TID WC Caren Griffins, MD      ? Doug Sou Hold] insulin glargine-yfgn [facility]) injection 35 Units  35 Units Subcutaneous Daily Wyvonnia Dusky, MD   35 Units at 01/04/22 1019  ? methylPREDNISolone sodium succinate (SOLU-MEDROL) 125 mg/2 mL injection 125 mg  125 mg Intravenous Once PRN Kris Hartmann, NP      ? [MAR Hold] metoprolol succinate (TOPROL-XL) 24 hr tablet 25 mg  25 mg Oral QHS Loletha Grayer, MD   25 mg at 01/03/22 2200  ? midazolam (VERSED) 2 MG/ML syrup 8 mg  8 mg Oral Once PRN Kris Hartmann, NP      ? [MAR Hold] morphine (PF) 2 MG/ML injection 2 mg  2 mg Intravenous Q4H PRN Wyvonnia Dusky, MD      ? Doug Sou Hold] multivitamin with minerals tablet 1 tablet  1 tablet Oral Daily Loletha Grayer, MD   1 tablet at 01/04/22 0905  ? [MAR Hold] ondansetron (ZOFRAN) injection 4 mg  4 mg Intravenous Q6H PRN Wyvonnia Dusky,  MD   4 mg at 12/30/21 1629  ? [MAR Hold] ondansetron (ZOFRAN) injection 4 mg  4 mg Intravenous Q6H PRN Kris Hartmann, NP      ? [MAR Hold] oxyCODONE (Oxy IR/ROXICODONE) immediate release tablet 10 mg  10 mg Oral Q6H PRN Wyvonnia Dusky, MD   10 mg at 01/03/22 2202  ? [MAR Hold] patiromer (VELTASSA) packet 16.8 g  16.8 g Oral Daily Breeze, Benancio Deeds, NP   16.8 g at 01/04/22 1030  ? vancomycin (VANCOCIN) IVPB 1000 mg/200 mL premix  1,000 mg Intravenous 60 min Pre-Op Kris Hartmann, NP      ? ? ?Past Medical History:  ?Diagnosis Date  ? Coronary artery disease   ? Hypertension   ? ? ?Past Surgical History:  ?Procedure Laterality Date  ? CARDIAC CATHETERIZATION Left 03/01/2016  ? Procedure: Left Heart Cath and Coronary Angiography;  Surgeon: Teodoro Spray, MD;  Location: [facility];  Service: Cardiovascular;  Laterality: Left;  ? ? ? ?Social History  ? ?Tobacco Use  ? Smoking status: Never  ?Substance Use Topics  ? Alcohol use: No  ? Drug use: No  ? ? ?Family History ?No history of bleeding disorders, clotting disorders, autoimmune diseases, or aneurysms ? ?Allergies  ?Allergen Reactions  ? Penicillins Rash  ? ? ? ?REVIEW OF SYSTEMS (Negative unless checked) ? ?Constitutional: '[]'$ Weight loss  '[]'$ Fever  '[]'$ Chills ?Cardiac: '[]'$ Chest pain   '[]'$ Chest pressure   '[]'$ Palpitations   '[]'$ Shortness of breath when laying flat   '[x]'$ Shortness of breath at rest   '[x]'$ Shortness of breath with exertion. ?Vascular:  '[]'$ Pain in legs with walking   '[]'$ Pain in legs at rest   '[]'$ Pain in legs when laying flat   '[]'$ Claudication   '[]'$ Pain in feet when walking  '[]'$ Pain in feet at rest  '[]'$ Pain in feet when laying flat   '[]'$ History of DVT   '[]'$ Phlebitis   '[]'$ Swelling in legs   '[]'$ Varicose veins   '[]'$ Non-healing ulcers ?Pulmonary:   '[]'$ Uses home oxygen   '[]'$ Productive cough   '[]'$ Hemoptysis   '[]'$ Wheeze  '[]'$ COPD   '[]'$ Asthma ?Neurologic:  '[]'$ Dizziness  '[]'$ Blackouts   '[]'$ Seizures   '[x]'$ History of stroke   '[]'$ History of TIA  '[]'$ Aphasia   '[]'$ Temporary blindness    '[]'$ Dysphagia   '[]'$ Weakness or numbness in arms   '[]'$ Weakness or numbness in legs ?Musculoskeletal:  [  x]Arthritis   '[]'$ Joint swelling   '[]'$ Joint pain   '[]'$ Low back pain ?Hematologic:  '[]'$ Easy bruising  '[]'$ Easy bleeding   '[]'$ Hypercoagulable state   '[x]'$ Anemic  '[]'$ Hepatitis ?Gastrointestinal:  '[]'$ Blood in stool   '[]'$ Vomiting blood  '[]'$ Gastroesophageal reflux/heartburn   '[]'$ Difficulty swallowing. ?Genitourinary:  '[x]'$ Chronic kidney disease   '[]'$ Difficult urination  '[]'$ Frequent urination  '[]'$ Burning with urination   '[]'$ Blood in urine ?Skin:  '[]'$ Rashes   '[]'$ Ulcers   '[]'$ Wounds ?Psychological:  '[]'$ History of anxiety   '[]'$  History of major depression. ? ? ?  ? ?Physical Examination ? ?Vitals:  ? 01/03/22 2213 01/04/22 0504 01/04/22 0858 01/04/22 1247  ?BP: (!) 148/79 136/61 (!) 144/70 (!) 147/67  ?Pulse: 82 85 89 87  ?Resp:  '16 19 18  '$ ?Temp:  97.7 ?F (36.5 ?C)    ?TempSrc:      ?SpO2:  99% 98% 99%  ?Weight:      ?Height:      ? ?Body mass index is 19.71 kg/m?. ?Gen: thin, chronically ill appearing ?Head: [place]/AT, + temporalis wasting ?Ear/Nose/Throat: Hearing grossly intact, nares w/o erythema or drainage ?Eyes: Sclera non-icteric, conjunctiva clear ?Neck: Supple, no nuchal rigidity.  No JVD.  ?Pulmonary:  Good air movement, clear to auscultation bilaterally.  ?Cardiac: RRR, no JVD ?Gastrointestinal: soft, non-tender/non-distended. No guarding/reflex.  ?Musculoskeletal: M/S 5/5 throughout.  Extremities without ischemic changes.  No deformity or atrophy. Mild Edema in the lower extremities bilaterally ?Psychiatric: Difficult to assess due to the severity of patient's illness. ?Dermatologic: No rashes or ulcers noted.   ?Lymph : No Cervical, Axillary, or Inguinal lymphadenopathy. ? ? ? ? ?CBC ?Lab Results  ?Component Value Date  ? WBC 15.7 (H) 01/04/2022  ? HGB 8.1 (L) 01/04/2022  ? HCT 26.3 (L) 01/04/2022  ? MCV 84.6 01/04/2022  ? PLT 338 01/04/2022  ? ? ?BMET ?   ?Component Value Date/Time  ? NA 126 (L) 01/04/2022 0422  ? K 6.7 (HH) 01/04/2022 0422  ?  CL 96 (L) 01/04/2022 0422  ? CO2 20 (L) 01/04/2022 0422  ? GLUCOSE 419 (H) 01/04/2022 0422  ? BUN 101 (H) 01/04/2022 1225  ? CREATININE 4.50 (H) 01/04/2022 1225  ? CALCIUM 8.3 (L) 01/04/2022 0422  ? GFRNONAA

## 2022-01-04 NOTE — Op Note (Signed)
?  OPERATIVE NOTE ? ? ?PROCEDURE: ?Ultrasound guidance for vascular access right femoral vein ?Placement of a 30 cm triple-lumen dialysis catheter right femoral vein ? ?PRE-OPERATIVE DIAGNOSIS: 1. Acute renal failure ?2.  Hyperkalemia ? ?POST-OPERATIVE DIAGNOSIS: Same ? ?SURGEON: Leotis Pain, MD ? ?ASSISTANT(S): None ? ?ANESTHESIA: local ? ?ESTIMATED BLOOD LOSS: Minimal  ? ?FINDING(S): ?1.  None ? ?SPECIMEN(S):  None ? ?INDICATIONS:    ?Patient is a 71 y.o.female who presents with hyperkalemia and acute kidney injury requiring immediate dialysis.  We are asked to place a temporary dialysis catheter.  Risks and benefits were discussed, and informed consent was obtained.. ? ?DESCRIPTION: ?After obtaining full informed written consent, the patient was laid flat in the bed.  The right groin was sterilely prepped and draped in a sterile surgical field was created. The right femoral vein was visualized with ultrasound and found to be widely patent. It was then accessed under direct guidance without difficulty with a Seldinger needle and a permanent image was recorded. A J-wire was then placed. After skin nick and dilatation, a 30 cm triple-lumen dialysis catheter was placed over the wire and the wire was removed. The lumens withdrew dark red nonpulsatile blood and flushed easily with sterile saline. The catheter was secured to the skin with 3 nylon sutures. Sterile dressing was placed. ? ?COMPLICATIONS: None ? ?CONDITION: Stable ? ?Leotis Pain ?01/04/2022 ?1:35 PM ? ?This note was created with Dragon Medical transcription system. Any errors in dictation are purely unintentional.  ?

## 2022-01-04 NOTE — TOC Progression Note (Signed)
Transition of Care (TOC) - Progression Note  ? ? ?Patient Details  ?Name: Deanna [name] ?MRN: [id number] ?Date of Birth: 1951-01-05 ? ?Transition of Care (TOC) CM/SW Contact  ?Pete Pelt, RN ?Phone Number: ?01/04/2022, 12:46 PM ? ?Clinical Narrative:   Patient is requiring continuing medical workup at this time.  Toc to follow for needs. ? ? ? ?Expected Discharge Plan: [place] ?Barriers to Discharge: Continued Medical Work up ? ?Expected Discharge Plan and Services ?Expected Discharge Plan: [place] ?  ?Discharge Planning Services: CM Consult ?Post Acute Care Choice: Durable Medical Equipment, Home Health ?Living arrangements for the past 2 months: [place] ?                ?DME Arranged: 3-N-1 ?DME Agency: [employer] ?Date DME Agency Contacted: 12/29/21 ?Time DME Agency Contacted: [phone] ?Representative spoke with at DME Agency: Suanne Marker ?HH Arranged: OT, PT ?  ?  ?  ?  ? ? ?Social Determinants of Health (SDOH) Interventions ?  ? ?Readmission Risk Interventions ? ?  12/29/2021  ?  2:33 PM  ?Readmission Risk Prevention Plan  ?Transportation Screening Complete  ?PCP or Specialist Appt within 5-7 Days Complete  ?Home Care Screening Complete  ?Medication Review (RN CM) Complete  ? ? ?

## 2022-01-04 NOTE — Progress Notes (Addendum)
?[facility]  ?ROUNDING NOTE  ? ?Subjective:  ? ?Patient states she's not feeling well today.  ?Complains of dizziness ?Reduced appetite ?Denies shortness of breath ? ?Creatinine 4.59 ? ? ?Objective:  ?Vital signs in last 24 hours:  ?Temp:  [97.7 ?F (36.5 ?C)-98.1 ?F (36.7 ?C)] 97.7 ?F (36.5 ?C) (04/12 0504) ?Pulse Rate:  [68-92] 83 (04/12 1336) ?Resp:  [16-19] 18 (04/12 1247) ?BP: (135-148)/(58-79) 135/62 (04/12 1336) ?SpO2:  [97 %-100 %] 97 % (04/12 1336) ? ?Weight change:  ?Filed Weights  ? 12/27/21 1300  ?Weight: 67.8 kg  ? ? ?Intake/Output: ?I/O last 3 completed shifts: ?In: 752.3 [I.V.:752.3] ?Out: -  ?  ?Intake/Output this shift: ? No intake/output data recorded. ? ?Physical Exam: ?General: NAD, laying in bed  ?Head: Normocephalic, atraumatic. Moist oral mucosal membranes  ?Eyes: Anicteric  ?Lungs:  Clear to auscultation, normal effort  ?Heart: Regular rate and rhythm  ?Abdomen:  Soft, nontender  ?Extremities:  No peripheral edema.  ?Neurologic: Nonfocal, moving all four extremities  ?Skin: No lesions  ?Access: none  ? ? ?Basic Metabolic Panel: ?Recent Labs  ?Lab 01/01/22 ?0446 01/01/22 ?7741 01/01/22 ?1032 01/01/22 ?2331 01/02/22 ?2878 01/02/22 ?1047 01/02/22 ?1428 01/02/22 ?2227 01/03/22 ?6767 01/04/22 ?0422 01/04/22 ?1225  ?NA 131* 130*  --   --  127*  --   --   --  129* 126*  --   ?K 6.4* 6.5*   < > 5.7* 5.5* 5.1 5.0  --  6.1* 6.7*  --   ?CL 100 100  --   --  94*  --   --   --  98 96*  --   ?CO2 22 22  --   --  22  --   --   --  21* 20*  --   ?GLUCOSE 352* 361*  --  509* 527*  --   --  497* 454* 419*  --   ?BUN 58* 60*  --   --  71*  --   --   --  82* 90* 101*  ?CREATININE 3.16* 3.26*  --   --  3.58*  --   --   --  3.87* 4.29* 4.50*  ?CALCIUM 8.3* 8.4*  --   --  8.0*  --   --   --  8.4* 8.3*  --   ? < > = values in this interval not displayed.  ? ? ? ?Liver Function Tests: ?No results for input(s): AST, ALT, ALKPHOS, BILITOT, PROT, ALBUMIN in the last 168 hours. ? ?No results for input(s):  LIPASE, AMYLASE in the last 168 hours. ?No results for input(s): AMMONIA in the last 168 hours. ? ?CBC: ?Recent Labs  ?Lab 12/31/21 ?0500 01/01/22 ?0446 01/02/22 ?2094 01/03/22 ?7096 01/04/22 ?0422  ?WBC 7.4 10.4 14.3* 16.7* 15.7*  ?HGB 9.0* 9.0* 8.9* 8.3* 8.1*  ?HCT 29.0* 29.2* 28.7* 27.1* 26.3*  ?MCV 85.0 84.1 84.4 85.0 84.6  ?PLT 293 333 349 321 338  ? ? ? ?Cardiac Enzymes: ?No results for input(s): CKTOTAL, CKMB, CKMBINDEX, TROPONINI in the last 168 hours. ? ?BNP: ?Invalid input(s): POCBNP ? ?CBG: ?Recent Labs  ?Lab 01/03/22 ?1232 01/03/22 ?1627 01/03/22 ?2146 01/04/22 ?2836 01/04/22 ?1213  ?[place] ? ? ?Microbiology: ?Results for orders placed or performed during the hospital encounter of 12/26/21  ?Respiratory (~20 pathogens) panel by PCR     Status: Abnormal  ? Collection Time: 12/27/21 11:32 AM  ? Specimen: Nasopharyngeal Swab; Respiratory  ?Result Value  Ref Range Status  ? Adenovirus NOT DETECTED NOT DETECTED Final  ? Coronavirus 229E NOT DETECTED NOT DETECTED Final  ?  Comment: (NOTE) ?The Coronavirus on the Respiratory Panel, DOES NOT test for the novel  ?Coronavirus (2019 nCoV) ?  ? Coronavirus HKU1 NOT DETECTED NOT DETECTED Final  ? Coronavirus NL63 NOT DETECTED NOT DETECTED Final  ? Coronavirus OC43 NOT DETECTED NOT DETECTED Final  ? Metapneumovirus NOT DETECTED NOT DETECTED Final  ? Rhinovirus / Enterovirus NOT DETECTED NOT DETECTED Final  ? Influenza A NOT DETECTED NOT DETECTED Final  ? Influenza B NOT DETECTED NOT DETECTED Final  ? Parainfluenza Virus 1 NOT DETECTED NOT DETECTED Final  ? Parainfluenza Virus 2 NOT DETECTED NOT DETECTED Final  ? Parainfluenza Virus 3 DETECTED (A) NOT DETECTED Final  ? Parainfluenza Virus 4 NOT DETECTED NOT DETECTED Final  ? Respiratory Syncytial Virus NOT DETECTED NOT DETECTED Final  ? Bordetella pertussis NOT DETECTED NOT DETECTED Final  ? Bordetella Parapertussis NOT DETECTED NOT DETECTED Final  ? Chlamydophila pneumoniae NOT DETECTED NOT  DETECTED Final  ? Mycoplasma pneumoniae NOT DETECTED NOT DETECTED Final  ?  Comment: Performed at [facility], [place] [address], [place], [place] [address]  ?Resp Panel by RT-PCR (Flu A&B, Covid) Nasopharyngeal Swab     Status: None  ? Collection Time: 12/27/21 11:32 AM  ? Specimen: Nasopharyngeal Swab; Nasopharyngeal(NP) swabs in vial transport medium  ?Result Value Ref Range Status  ? SARS Coronavirus 2 by RT PCR NEGATIVE NEGATIVE Final  ?  Comment: (NOTE) ?SARS-CoV-2 target nucleic acids are NOT DETECTED. ? ?The SARS-CoV-2 RNA is generally detectable in upper respiratory ?specimens during the acute phase of infection. The lowest ?concentration of SARS-CoV-2 viral copies this assay can detect is ?138 copies/mL. A negative result does not preclude SARS-Cov-2 ?infection and should not be used as the sole basis for treatment or ?other patient management decisions. A negative result may occur with  ?improper specimen collection/handling, submission of specimen other ?than nasopharyngeal swab, presence of viral mutation(s) within the ?areas targeted by this assay, and inadequate number of viral ?copies(<138 copies/mL). A negative result must be combined with ?clinical observations, patient history, and epidemiological ?information. The expected result is Negative. ? ?Fact Sheet for Patients:  ?EntrepreneurPulse.com.au ? ?Fact Sheet for Healthcare Providers:  ?IncredibleEmployment.be ? ?This test is no t yet approved or cleared by the [place] FDA and  ?has been authorized for detection and/or diagnosis of SARS-CoV-2 by ?FDA under an Emergency Use Authorization (EUA). This EUA will remain  ?in effect (meaning this test can be used) for the duration of the ?COVID-19 declaration under Section 564(b)(1) of the Act, 21 ?U.S.C.section 360bbb-3(b)(1), unless the authorization is terminated  ?or revoked sooner.  ? ? ?  ? Influenza A by PCR NEGATIVE NEGATIVE Final  ? Influenza B by  PCR NEGATIVE NEGATIVE Final  ?  Comment: (NOTE) ?The Xpert Xpress SARS-CoV-2/FLU/RSV plus assay is intended as an aid ?in the diagnosis of influenza from Nasopharyngeal swab specimens and ?should not be used as a sole basis for treatment. Nasal washings and ?aspirates are unacceptable for Xpert Xpress SARS-CoV-2/FLU/RSV ?testing. ? ?Fact Sheet for Patients: ?EntrepreneurPulse.com.au ? ?Fact Sheet for Healthcare Providers: ?IncredibleEmployment.be ? ?This test is not yet approved or cleared by the [place] FDA and ?has been authorized for detection and/or diagnosis of SARS-CoV-2 by ?FDA under an Emergency Use Authorization (EUA). This EUA will remain ?in effect (meaning this test can be used) for the duration of the ?  COVID-19 declaration under Section 564(b)(1) of the Act, 21 U.S.C. ?section 360bbb-3(b)(1), unless the authorization is terminated or ?revoked. ? ?Performed at [facility], [place], ?[place] [address] ?  ? ? ?Coagulation Studies: ?No results for input(s): LABPROT, INR in the last 72 hours. ? ?Urinalysis: ?No results for input(s): COLORURINE, LABSPEC, [place], GLUCOSEU, HGBUR, BILIRUBINUR, KETONESUR, PROTEINUR, UROBILINOGEN, NITRITE, LEUKOCYTESUR in the last 72 hours. ? ?Invalid input(s): APPERANCEUR  ? ? ?Imaging: ?PERIPHERAL VASCULAR CATHETERIZATION ? ?Result Date: 01/04/2022 ?See surgical note for result.  ? ? ?Medications:  ? ? sodium chloride 50 mL/hr at 01/02/22 1830  ? ?  stroke: early stages of recovery book   Does not apply Once  ? aspirin EC  81 mg Oral Daily  ? atorvastatin  80 mg Oral QPM  ? clopidogrel  75 mg Oral Daily  ? feeding supplement (NEPRO CARB STEADY)  237 mL Oral QHS  ? gabapentin  300 mg Oral QHS  ? heparin  5,000 Units Subcutaneous Q8H  ? insulin aspart  0-20 Units Subcutaneous TID WC  ? insulin aspart  0-5 Units Subcutaneous QHS  ? insulin aspart  4 Units Subcutaneous TID WC  ? insulin glargine-yfgn  35 Units  Subcutaneous Daily  ? metoprolol succinate  25 mg Oral QHS  ? [START ON 01/05/2022] multivitamin  1 tablet Oral QHS  ? patiromer  16.8 g Oral Daily  ? ?acetaminophen **OR** acetaminophen (TYLENOL) oral liquid

## 2022-01-05 DIAGNOSIS — R531 Weakness: Secondary | ICD-10-CM | POA: Diagnosis not present

## 2022-01-05 LAB — COMPREHENSIVE METABOLIC PANEL
ALT: 152 U/L — ABNORMAL HIGH (ref 0–44)
AST: 94 U/L — ABNORMAL HIGH (ref 15–41)
Albumin: 2.3 g/dL — ABNORMAL LOW (ref 3.5–5.0)
Alkaline Phosphatase: 152 U/L — ABNORMAL HIGH (ref 38–126)
Anion gap: 9 (ref 5–15)
BUN: 80 mg/dL — ABNORMAL HIGH (ref 8–23)
CO2: 23 mmol/L (ref 22–32)
Calcium: 8.1 mg/dL — ABNORMAL LOW (ref 8.9–10.3)
Chloride: 99 mmol/L (ref 98–111)
Creatinine, Ser: 3.65 mg/dL — ABNORMAL HIGH (ref 0.44–1.00)
GFR, Estimated: 13 mL/min — ABNORMAL LOW (ref >60)
Glucose, Bld: 251 mg/dL — ABNORMAL HIGH (ref 70–99)
Potassium: 5.5 mmol/L — ABNORMAL HIGH (ref 3.5–5.1)
Sodium: 131 mmol/L — ABNORMAL LOW (ref 135–145)
Total Bilirubin: 0.5 mg/dL (ref 0.3–1.2)
Total Protein: 6.2 g/dL — ABNORMAL LOW (ref 6.5–8.1)

## 2022-01-05 LAB — CBC
HCT: 24.8 % — ABNORMAL LOW (ref 36.0–46.0)
Hemoglobin: 7.6 g/dL — ABNORMAL LOW (ref 12.0–15.0)
MCH: 25.9 pg — ABNORMAL LOW (ref 26.0–34.0)
MCHC: 30.6 g/dL (ref 30.0–36.0)
MCV: 84.4 fL (ref 80.0–100.0)
Platelets: 330 10*3/uL (ref 150–400)
RBC: 2.94 MIL/uL — ABNORMAL LOW (ref 3.87–5.11)
RDW: 14.5 % (ref 11.5–15.5)
WBC: 16.1 10*3/uL — ABNORMAL HIGH (ref 4.0–10.5)
nRBC: 0.1 % (ref 0.0–0.2)

## 2022-01-05 LAB — HEPATITIS B SURFACE ANTIBODY,QUALITATIVE: Hep B S Ab: NONREACTIVE

## 2022-01-05 LAB — PHOSPHORUS: Phosphorus: 5 mg/dL — ABNORMAL HIGH (ref 2.5–4.6)

## 2022-01-05 LAB — GLUCOSE, CAPILLARY
Glucose-Capillary: 378 mg/dL — ABNORMAL HIGH (ref 70–99)
Glucose-Capillary: 123 mg/dL — ABNORMAL HIGH (ref 70–99)
Glucose-Capillary: 127 mg/dL — ABNORMAL HIGH (ref 70–99)
Glucose-Capillary: 112 mg/dL — ABNORMAL HIGH (ref 70–99)

## 2022-01-05 LAB — HEPATITIS B SURFACE ANTIGEN: Hepatitis B Surface Ag: NONREACTIVE

## 2022-01-05 LAB — HEPATITIS B SURFACE ANTIBODY, QUANTITATIVE: Hep B S AB Quant (Post): <3.1 m[IU]/mL — ABNORMAL LOW (ref >9.9)

## 2022-01-05 LAB — MAGNESIUM: Magnesium: 1.6 mg/dL — ABNORMAL LOW (ref 1.7–2.4)

## 2022-01-05 LAB — HEPATITIS B CORE ANTIBODY, TOTAL: Hep B Core Total Ab: REACTIVE — AB

## 2022-01-05 MED ORDER — EPOETIN ALFA 40000 UNIT/ML IJ SOLN
20000.0000 [IU] | INTRAMUSCULAR | Status: DC
  Administered 2022-01-05 – 2022-01-12 (×2): 20000 [IU] via SUBCUTANEOUS
  Filled 2022-01-05 (×2): qty 1

## 2022-01-05 MED ORDER — HEPARIN SODIUM (PORCINE) 1000 UNIT/ML IJ SOLN
INTRAMUSCULAR | Status: AC
  Administered 2022-01-05: 3600 [IU] via INTRAVENOUS_CENTRAL
  Filled 2022-01-05: qty 10

## 2022-01-05 NOTE — Progress Notes (Signed)
?PROGRESS NOTE ? ?Deanna [name] TDH:[id number] DOB: 14-Mar-1951 DOA: 12/26/2021 ?PCP: [facility] [place] ? ? LOS: 8 days  ? ?Brief Narrative / Interim history: ?71 year old female with past medical history of CAD, hypertension and chronic kidney disease stage IV.  She presents with right-sided numbness and weakness. MRI of the brain shows a punctate focus of reduced diffusion within the right superior pericallosal white matter.  Case discussed with neurology and this likely would not be causing her right-sided weakness so MRI of the cervical and thoracic spine ordered an MRA of the brain ordered. While I was in the room the patient had a vasovagal event where she got dizzy and lightheaded and was seeing spots.  This resolved quickly.  The patient was not orthostatic before this and blood pressure was okay during the events. ? ?Subjective / 24h Interval events: ?No complaints.  Underwent dialysis yesterday, she tells me that she felt tired afterwards but overall tolerated it well ? ?Assesement and Plan: ?Principal Problem: ?  Right sided weakness ?Active Problems: ?  Acute CVA (cerebrovascular accident) ([place]) ?  Acute kidney injury superimposed on chronic kidney disease ([place]) ?  Lobar pneumonia ([place]) ?  Essential (primary) hypertension ?  HFrEF (heart failure with reduced ejection fraction) ([place]) ?  Type 2 diabetes mellitus without complications ([place]) ?  Hypokalemia ?  Anemia ?  CVA (cerebral vascular accident) [facility]) ? ? ?Assessment and Plan: ?Principal problem ?Right sided weakness -Right-sided weakness not explained by MRI of the brain.  She underwent a C-spine MRI which showed mild spinal canal stenosis and moderate right neuroforaminal stenosis at C6-7, severe right C5-6 neuroforaminal stenosis.  Neurology and neurosurgery evaluated, it is not felt to be related to C-spine, and this could be further addressed as an outpatient.  Improving, continue PT ? ?Active problems ?Acute CVA  (cerebrovascular accident) [facility]) -neurology consulted and followed patient while hospitalized.  Underwent a full work-up, currently recommending aspirin and Plavix for 21 days and aspirin alone.  Continue statin along with dual antiplatelets ? ?Lobar pneumonia ([place]) -Right lobar pneumonia possibly due to parainfluenza.  Also completed a course of ceftriaxone and azithromycin.  ? ?Acute kidney injury superimposed on chronic kidney disease ([place]) -Acute kidney injury on chronic kidney disease stage IV.  Nephrology consulted.  She had persistent hyperkalemia that did not respond to medications, eventually vascular surgery was consulted and underwent a dialysis catheter placement on 4/12.  She had the first HD session 4/12, repeat HD today 4/13. ? ?Anemia -due to CKD. No bleeding, monitor ? ?Hypokalemia / hyperkalemia -Potassium 3.2 on presentation, now persistently hyperkalemic despite treatment.  HD 4/12 and again today/13.  Monitor potassium closely ? ?Type 2 diabetes mellitus without complications (H[facility]) -continue glargine, add novolog ? ?CBG (last 3)  ?Recent Labs  ?  01/04/22 ?1949 01/04/22 ?2222 01/05/22 ?6468  ?GLUCAP 157* 167* 378*  ? ? ?HFrEF (heart failure with reduced ejection fraction) ([place]) -a little bit more short of breath this morning, stop IV fluids.  With acute kidney injury holding diuretics. EF 30-35%.  To undergo HD today ? ?Essential (primary) hypertension -continue toprol ? ?Scheduled Meds: ?  stroke: early stages of recovery book   Does not apply Once  ? aspirin EC  81 mg Oral Daily  ? atorvastatin  80 mg Oral QPM  ? Chlorhexidine Gluconate Cloth  6 each Topical Q0600  ? clopidogrel  75 mg Oral Daily  ? feeding supplement (NEPRO CARB STEADY)  237 mL Oral QHS  ?  gabapentin  300 mg Oral QHS  ? heparin  5,000 Units Subcutaneous Q8H  ? insulin aspart  0-20 Units Subcutaneous TID WC  ? insulin aspart  0-5 Units Subcutaneous QHS  ? insulin aspart  4 Units Subcutaneous TID WC  ? insulin glargine-yfgn  35  Units Subcutaneous Daily  ? metoprolol succinate  25 mg Oral QHS  ? multivitamin  1 tablet Oral QHS  ? patiromer  16.8 g Oral Daily  ? ?Continuous Infusions: ? ? ?PRN Meds:.acetaminophen **OR** acetaminophen (TYLENOL) oral liquid 160 mg/5 mL **OR** acetaminophen, benzonatate, guaiFENesin-dextromethorphan, HYDROmorphone (DILAUDID) injection, morphine injection, ondansetron (ZOFRAN) IV, ondansetron (ZOFRAN) IV, oxyCODONE ? ?Diet Orders (From admission, onward)  ? ?  Start     Ordered  ? 01/04/22 1411  Diet renal/carb modified with fluid restriction Diet-HS Snack? Nothing; Fluid restriction: 1200 mL Fluid; Room service appropriate? Yes; Fluid consistency: Thin  Diet effective now       ?Question Answer Comment  ?Diet-HS Snack? Nothing   ?Fluid restriction: 1200 mL Fluid   ?Room service appropriate? Yes   ?Fluid consistency: Thin   ?  ? 01/04/22 1410  ? ?  ?  ? ?  ? ? ?DVT prophylaxis: Place and maintain sequential compression device Start: 12/28/21 1332 ?heparin injection 5,000 Units Start: 12/27/21 0200 ? ? ?Lab Results  ?Component Value Date  ? PLT 330 01/05/2022  ? ? ?  Code Status: Full Code ? ?Family Communication: no family at bedside  ? ?Status is: Inpatient ? ?Remains inpatient appropriate because: needs HD ? ?Level of care: Med-Surg ? ?Consultants:  ?Nephrology  ?Neurology ?Neurosurgery  ? ? ?Objective: ?Vitals:  ? 01/04/22 1900 01/04/22 2118 01/05/22 0617 01/05/22 0725  ?BP:  139/63 (!) 148/66 128/60  ?Pulse: 74 92 96 93  ?Resp: '17 16 16 18  '$ ?Temp:  97.6 ?F (36.4 ?C) 97.8 ?F (36.6 ?C) 98.6 ?F (37 ?C)  ?TempSrc:   Oral   ?SpO2: 99% 97% 97% 97%  ?Weight:      ?Height:      ? ? ?Intake/Output Summary (Last 24 hours) at 01/05/2022 0809 ?Last data filed at 01/04/2022 1844 ?Gross per 24 hour  ?Intake --  ?Output 0 ml  ?Net 0 ml  ? ? ?Wt Readings from Last 3 Encounters:  ?01/04/22 85.5 kg  ?12/09/17 83.9 kg  ?03/01/16 78.5 kg  ? ? ?Examination: ? ?Constitutional: NAD ?Eyes: lids and conjunctivae normal, no scleral  icterus ?ENMT: mmm ?Neck: normal, supple ?Respiratory: Diminished at the bases but overall clear, no wheezing, no crackles. Normal respiratory effort.  ?Cardiovascular: Regular rate and rhythm, no murmurs / rubs / gallops.  ?Abdomen: soft, no distention, no tenderness. Bowel sounds positive.  ?Skin: no rashes ?Neurologic: no focal deficits, equal strength ? ? ?Data Reviewed: I have independently reviewed following labs and imaging studies  ? ?CBC ?Recent Labs  ?Lab 01/01/22 ?0446 01/02/22 ?6629 01/03/22 ?4765 01/04/22 ?0422 01/05/22 ?4650  ?WBC 10.4 14.3* 16.7* 15.7* 16.1*  ?HGB 9.0* 8.9* 8.3* 8.1* 7.6*  ?HCT 29.2* 28.7* 27.1* 26.3* 24.8*  ?PLT 333 349 321 338 330  ?MCV 84.1 84.4 85.0 84.6 84.4  ?MCH 25.9* 26.2 26.0 26.0 25.9*  ?MCHC 30.8 31.0 30.6 30.8 30.6  ?RDW 13.9 14.1 14.1 14.4 14.5  ? ? ? ?Recent Labs  ?Lab 01/01/22 ?0634 01/01/22 ?1032 01/02/22 ?3546 01/02/22 ?1047 01/02/22 ?1428 01/02/22 ?2227 01/03/22 ?5681 01/04/22 ?0422 01/04/22 ?1225 01/04/22 ?1647 01/05/22 ?2751  ?NA 130*  --  127*  --   --   --  129* 126*  --   --  131*  ?K 6.5*   < > 5.5*   < > 5.0  --  6.1* 6.7*  --  5.5* 5.5*  ?CL 100  --  94*  --   --   --  98 96*  --   --  99  ?CO2 22  --  22  --   --   --  21* 20*  --   --  23  ?GLUCOSE 361*   < > 527*  --   --  497* 454* 419*  --   --  251*  ?BUN 60*  --  71*  --   --   --  82* 90* 101*  --  80*  ?CREATININE 3.26*  --  3.58*  --   --   --  3.87* 4.29* 4.50*  --  3.65*  ?CALCIUM 8.4*  --  8.0*  --   --   --  8.4* 8.3*  --   --  8.1*  ?AST  --   --   --   --   --   --   --   --   --   --  94*  ?ALT  --   --   --   --   --   --   --   --   --   --  152*  ?ALKPHOS  --   --   --   --   --   --   --   --   --   --  152*  ?BILITOT  --   --   --   --   --   --   --   --   --   --  0.5  ?ALBUMIN  --   --   --   --   --   --   --   --   --   --  2.3*  ?MG  --   --   --   --   --   --   --   --   --   --  1.6*  ? < > = values in this interval not displayed.   ? ? ? ?------------------------------------------------------------------------------------------------------------------ ?No results for input(s): CHOL, HDL, LDLCALC, TRIG, CHOLHDL, LDLDIRECT in the last 72 hours. ? ?Lab Results  ?Component Value Date  ? HGBA1C 5

## 2022-01-05 NOTE — Progress Notes (Signed)
Physical Therapy Treatment ?Patient Details ?Name: Deanna [name] ?MRN: [id number] ?DOB: [birth year] ?Today's Date: 01/05/2022 ? ? ?History of Present Illness 71 y.o. female with medical history significant of hypertension, CAD with recent stents presenting with right-sided weakness, worse since yesterday evening. 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 No  hemorrhage or mass effect. ? ?  ?PT Comments  ? ? Pt received upright In bed, agreeable to light PT session due to fatigue from HD with encouragement. Pt remains supervision with bed mobility, appears with minor dizziness endorsing "room spinning" then "seeing stars". Appears pt has been screened earlier in admission with negative orthostatics, possible vestibular involvement based off of reports of room spinning with short periods with head changes specifically with bed mobility.  Pt tolerating manually resisted LE therex with focus on RLE well with STS transfer to RW with minguard ambulating 3' to recliner with poor safety awareness and use of AD despite mod to max VC's. Pt all needs in reach. Will continue to follow acutely to safely progress/maintain pt's level of function. D/c recs remain appropriate. ?  ?Recommendations for follow up therapy are one component of a multi-disciplinary discharge planning process, led by the attending physician.  Recommendations may be updated based on patient status, additional functional criteria and insurance authorization. ? ?Follow Up Recommendations ? Home health PT ?  ?  ?Assistance Recommended at Discharge Intermittent Supervision/Assistance  ?Patient can return home with the following Help with stairs or ramp for entrance;Assist for transportation;Assistance with cooking/housework;A little help with walking and/or transfers;A little help with bathing/dressing/bathroom ?  ?Equipment Recommendations ? BSC/3in1  ?   ?Recommendations for Other Services   ? ? ?  ?Precautions / Restrictions Precautions ?Precautions: Fall ?Restrictions ?Weight Bearing Restrictions: No  ?  ? ?Mobility ? Bed Mobility ?Overal bed mobility: Needs Assistance ?Bed Mobility: Supine to Sit ?  ?  ?Supine to sit: Supervision ?  ?  ?General bed mobility comments: in recliner post session ?Patient Response: Cooperative ? ?Transfers ?Overall transfer level: Needs assistance ?Equipment used: Rolling walker (2 wheels) ?Transfers: Sit to/from Stand ?Sit to Stand: Min guard ?  ?  ?  ?  ?  ?General transfer comment: use of grab bar, increased time to perform ?  ? ?Ambulation/Gait ?Ambulation/Gait assistance: Min guard ?Gait Distance (Feet): 3 Feet ?Assistive device: Rolling walker (2 wheels) ?Gait Pattern/deviations: Step-to pattern ?  ?  ?  ?General Gait Details: ambulating to recliner declining further mobility due to fatigue after HD ? ? ?Stairs ?  ?  ?  ?  ?  ? ? ?Wheelchair Mobility ?  ? ?Modified Rankin (Stroke Patients Only) ?  ? ? ?  ?Balance Overall balance assessment: Needs assistance ?Sitting-balance support: No upper extremity supported, Feet supported ?Sitting balance-[name]: Good ?Sitting balance - Comments: dizziness with positional changes transferring to seated ?  ?Standing balance support: Bilateral upper extremity supported, During functional activity ?Standing balance-[name]: Fair ?Standing balance comment: reliant on walker, poor standing tolerance ?  ?  ?  ?  ?  ?  ?  ?  ?  ?  ?  ?  ? ?  ?Cognition Arousal/Alertness: Awake/alert ?Behavior During Therapy: [facility] for tasks assessed/performed ?Overall Cognitive Status: Within Functional Limits for tasks assessed ?  ?  ?  ?  ?  ?  ?  ?  ?  ?  ?  ?  ?  ?  ?  ?  ?  General Comments: remains with poor safety awareness despite VC's ?  ?  ? ?  ?Exercises General Exercises - Lower Extremity ?Long Arc Quad: AROM, Strengthening, Both, 10 reps, Seated (with manual resistance on RLE) ?Hip  ABduction/ADduction: AROM, Strengthening, Both, 10 reps, Seated (with manual resistance) ?Hip Flexion/Marching: AROM, Strengthening, Both, 10 reps, Seated ? ?  ?General Comments   ?  ?  ? ?Pertinent Vitals/Pain Pain Assessment ?Pain Assessment: Faces ?Faces Pain Scale: Hurts a little bit ?Pain Location: L knee with mobility ?Pain Descriptors / Indicators: Aching ?Pain Intervention(s): Limited activity within patient's tolerance, Repositioned  ? ? ?Home Living   ?  ?  ?  ?  ?  ?  ?  ?  ?  ?   ?  ?Prior Function    ?  ?  ?   ? ?PT Goals (current goals can now be found in the care plan section) Acute Rehab PT Goals ?Patient Stated Goal: Go home ?PT Goal Formulation: With patient ?Time For Goal Achievement: 01/10/22 ?Potential to Achieve Goals: Good ?Progress towards PT goals: Progressing toward goals ? ?  ?Frequency ? ? ? Min 2X/week ? ? ? ?  ?PT Plan Current plan remains appropriate  ? ? ?Co-evaluation   ?  ?  ?  ?  ? ?  ?AM-PAC PT "6 Clicks" Mobility   ?Outcome Measure ? Help needed turning from your back to your side while in a flat bed without using bedrails?: A Little ?Help needed moving from lying on your back to sitting on the side of a flat bed without using bedrails?: A Little ?Help needed moving to and from a bed to a chair (including a wheelchair)?: A Little ?Help needed standing up from a chair using your arms (e.g., wheelchair or bedside chair)?: A Little ?Help needed to walk in hospital room?: A Lot ?Help needed climbing 3-5 steps with a railing? : A Lot ?6 Click Score: 16 ? ?  ?End of Session Equipment Utilized During Treatment: Gait belt ?Activity Tolerance: Patient limited by fatigue ?Patient left: with chair alarm set;with call bell/phone within reach ?Nurse Communication: Mobility status ?PT Visit Diagnosis: Muscle weakness (generalized) (M62.81);Difficulty in walking, not elsewhere classified (R26.2);History of falling (Z91.81) ?  ? ? ?Time: 3202-3343 ?PT Time Calculation (min) (ACUTE ONLY): 20  min ? ?Charges:  $Therapeutic Exercise: 8-22 mins          ?          ? ?Salem Caster. Fairly IV, PT, DPT ?Physical Therapist- [place]  ?[facility]  ?01/05/2022, 3:53 PM ? ?

## 2022-01-05 NOTE — Plan of Care (Signed)
?  Problem: Education: ?Goal: Knowledge of General Education information will improve ?Description: Including pain rating scale, medication(s)/side effects and non-pharmacologic comfort measures ?Outcome: Progressing ?  ?Problem: Health Behavior/Discharge Planning: ?Goal: Ability to manage health-related needs will improve ?Outcome: Progressing ?  ?Problem: Clinical Measurements: ?Goal: Ability to maintain clinical measurements within normal limits will improve ?Outcome: Progressing ?Goal: Will remain free from infection ?Outcome: Progressing ?Goal: Diagnostic test results will improve ?Outcome: Progressing ?Goal: Respiratory complications will improve ?Outcome: Progressing ?Goal: Cardiovascular complication will be avoided ?Outcome: Progressing ?  ?Problem: Activity: ?Goal: Risk for activity intolerance will decrease ?Outcome: Progressing ?  ?Problem: Nutrition: ?Goal: Adequate nutrition will be maintained ?Outcome: Progressing ?  ?Problem: Coping: ?Goal: Level of anxiety will decrease ?Outcome: Progressing ?  ?Problem: Elimination: ?Goal: Will not experience complications related to bowel motility ?Outcome: Progressing ?Goal: Will not experience complications related to urinary retention ?Outcome: Progressing ?  ?Problem: Pain Managment: ?Goal: General experience of comfort will improve ?Outcome: Progressing ?  ?Problem: Safety: ?Goal: Ability to remain free from injury will improve ?Outcome: Progressing ?  ?Problem: Skin Integrity: ?Goal: Risk for impaired skin integrity will decrease ?Outcome: Progressing ?  ?Problem: Education: ?Goal: Knowledge of disease or condition will improve ?Outcome: Progressing ?Goal: Knowledge of secondary prevention will improve (SELECT ALL) ?Outcome: Progressing ?Goal: Knowledge of patient specific risk factors will improve (INDIVIDUALIZE FOR PATIENT) ?Outcome: Progressing ?  ?

## 2022-01-05 NOTE — Progress Notes (Signed)
OT Cancellation Note ? ?Patient Details ?Name: Deanna [name] ?MRN: [id number] ?DOB: Mar 02, 1951 ? ? ?Cancelled Treatment:    Reason Eval/Treat Not Completed: Patient at procedure or test/ unavailable. Pt currently off the unit for dialysis. OT will re-attempt when pt is next available. ? ?Darleen Crocker, MS, OTR/L , CBIS ?ascom [phone]  ?01/05/22, 11:32 AM  ?

## 2022-01-05 NOTE — Progress Notes (Signed)
Hemodialysis Post Treatment Note: ? ?Tx date:01/05/2022 ?Tx time: 2 hours and 30 minutes ?Access:Right femoral catherter ?UF Removed: no fluid removal as ordered ? ?Note: HD treatment completed, tolerated well, no complications. Patient asymptomatic. ? ? ? ? ? ? ? ? ?  ?

## 2022-01-05 NOTE — Progress Notes (Signed)
?[facility]  ?ROUNDING NOTE  ? ?Subjective:  ? ?Right femoral dialysis catheter was placed. ?Patient underwent first dialysis treatment yesterday. ?She will be undergoing second dialysis treatment today. ?Still complaining of some numbness on her right side. ? ? ?Objective:  ?Vital signs in last 24 hours:  ?Temp:  [97.6 ?F (36.4 ?C)-98.7 ?F (37.1 ?C)] 98.7 ?F (37.1 ?C) (04/13 1012) ?Pulse Rate:  [59-96] 84 (04/13 1200) ?Resp:  [12-24] 21 (04/13 1200) ?BP: (102-165)/(51-70) 119/51 (04/13 1200) ?SpO2:  [97 %-100 %] 100 % (04/13 1200) ?Weight:  [81.6 kg-85.5 kg] 81.6 kg (04/13 1012) ? ?Weight change:  ?Filed Weights  ? 01/04/22 1630 01/04/22 1844 01/05/22 1012  ?Weight: 82.1 kg 85.5 kg 81.6 kg  ? ? ?Intake/Output: ?No intake/output data recorded. ?  ?Intake/Output this shift: ? No intake/output data recorded. ? ?Physical Exam: ?General: NAD, laying in bed  ?Head: Normocephalic, atraumatic. Moist oral mucosal membranes  ?Eyes: Anicteric  ?Lungs:  Clear to auscultation, normal effort  ?Heart: Regular rate and rhythm  ?Abdomen:  Soft, nontender  ?Extremities:  No peripheral edema.  ?Neurologic: Nonfocal, moving all four extremities  ?Skin: No lesions  ?Access: Right femoral nontunneled hemodialysis catheter  ? ? ?Basic Metabolic Panel: ?Recent Labs  ?Lab 01/01/22 ?0634 01/01/22 ?1032 01/02/22 ?4128 01/02/22 ?1047 01/02/22 ?1428 01/02/22 ?2227 01/03/22 ?7867 01/04/22 ?0422 01/04/22 ?1225 01/04/22 ?1647 01/05/22 ?6720  ?NA 130*  --  127*  --   --   --  129* 126*  --   --  131*  ?K 6.5*   < > 5.5*   < > 5.0  --  6.1* 6.7*  --  5.5* 5.5*  ?CL 100  --  94*  --   --   --  98 96*  --   --  99  ?CO2 22  --  22  --   --   --  21* 20*  --   --  23  ?GLUCOSE 361*   < > 527*  --   --  497* 454* 419*  --   --  251*  ?BUN 60*  --  71*  --   --   --  82* 90* 101*  --  80*  ?CREATININE 3.26*  --  3.58*  --   --   --  3.87* 4.29* 4.50*  --  3.65*  ?CALCIUM 8.4*  --  8.0*  --   --   --  8.4* 8.3*  --   --  8.1*  ?MG  --   --    --   --   --   --   --   --   --   --  1.6*  ?PHOS  --   --   --   --   --   --   --   --   --   --  5.0*  ? < > = values in this interval not displayed.  ? ? ? ?Liver Function Tests: ?Recent Labs  ?Lab 01/05/22 ?0313  ?AST 94*  ?ALT 152*  ?ALKPHOS 152*  ?BILITOT 0.5  ?PROT 6.2*  ?ALBUMIN 2.3*  ? ? ?No results for input(s): LIPASE, AMYLASE in the last 168 hours. ?No results for input(s): AMMONIA in the last 168 hours. ? ?CBC: ?Recent Labs  ?Lab 01/01/22 ?0446 01/02/22 ?9470 01/03/22 ?9628 01/04/22 ?0422 01/05/22 ?3662  ?WBC 10.4 14.3* 16.7* 15.7* 16.1*  ?HGB 9.0* 8.9* 8.3* 8.1* 7.6*  ?HCT 29.2* 28.7* 27.1* 26.3* 24.8*  ?  MCV 84.1 84.4 85.0 84.6 84.4  ?PLT 333 349 321 338 330  ? ? ? ?Cardiac Enzymes: ?No results for input(s): CKTOTAL, CKMB, CKMBINDEX, TROPONINI in the last 168 hours. ? ?BNP: ?Invalid input(s): POCBNP ? ?CBG: ?Recent Labs  ?Lab 01/04/22 ?0822 01/04/22 ?1213 01/04/22 ?1949 01/04/22 ?2222 01/05/22 ?5427  ?GLUCAP 062* 376* 283* 151* 761*  ? ? ? ?Microbiology: ?Results for orders placed or performed during the hospital encounter of 12/26/21  ?Respiratory (~20 pathogens) panel by PCR     Status: Abnormal  ? Collection Time: 12/27/21 11:32 AM  ? Specimen: Nasopharyngeal Swab; Respiratory  ?Result Value Ref Range Status  ? Adenovirus NOT DETECTED NOT DETECTED Final  ? Coronavirus 229E NOT DETECTED NOT DETECTED Final  ?  Comment: (NOTE) ?The Coronavirus on the Respiratory Panel, DOES NOT test for the novel  ?Coronavirus (2019 nCoV) ?  ? Coronavirus HKU1 NOT DETECTED NOT DETECTED Final  ? Coronavirus NL63 NOT DETECTED NOT DETECTED Final  ? Coronavirus OC43 NOT DETECTED NOT DETECTED Final  ? Metapneumovirus NOT DETECTED NOT DETECTED Final  ? Rhinovirus / Enterovirus NOT DETECTED NOT DETECTED Final  ? Influenza A NOT DETECTED NOT DETECTED Final  ? Influenza B NOT DETECTED NOT DETECTED Final  ? Parainfluenza Virus 1 NOT DETECTED NOT DETECTED Final  ? Parainfluenza Virus 2 NOT DETECTED NOT DETECTED Final  ?  Parainfluenza Virus 3 DETECTED (A) NOT DETECTED Final  ? Parainfluenza Virus 4 NOT DETECTED NOT DETECTED Final  ? Respiratory Syncytial Virus NOT DETECTED NOT DETECTED Final  ? Bordetella pertussis NOT DETECTED NOT DETECTED Final  ? Bordetella Parapertussis NOT DETECTED NOT DETECTED Final  ? Chlamydophila pneumoniae NOT DETECTED NOT DETECTED Final  ? Mycoplasma pneumoniae NOT DETECTED NOT DETECTED Final  ?  Comment: Performed at [facility], [place] [address] [place], [place] [address]  ?Resp Panel by RT-PCR (Flu A&B, Covid) Nasopharyngeal Swab     Status: None  ? Collection Time: 12/27/21 11:32 AM  ? Specimen: Nasopharyngeal Swab; Nasopharyngeal(NP) swabs in vial transport medium  ?Result Value Ref Range Status  ? SARS Coronavirus 2 by RT PCR NEGATIVE NEGATIVE Final  ?  Comment: (NOTE) ?SARS-CoV-2 target nucleic acids are NOT DETECTED. ? ?The SARS-CoV-2 RNA is generally detectable in upper respiratory ?specimens during the acute phase of infection. The lowest ?concentration of SARS-CoV-2 viral copies this assay can detect is ?138 copies/mL. A negative result does not preclude SARS-Cov-2 ?infection and should not be used as the sole basis for treatment or ?other patient management decisions. A negative result may occur with  ?improper specimen collection/handling, submission of specimen other ?than nasopharyngeal swab, presence of viral mutation(s) within the ?areas targeted by this assay, and inadequate number of viral ?copies(<138 copies/mL). A negative result must be combined with ?clinical observations, patient history, and epidemiological ?information. The expected result is Negative. ? ?Fact Sheet for Patients:  ?EntrepreneurPulse.com.au ? ?Fact Sheet for Healthcare Providers:  ?IncredibleEmployment.be ? ?This test is no t yet approved or cleared by the [place] FDA and  ?has been authorized for detection and/or diagnosis of SARS-CoV-2 by ?FDA under an Emergency Use  Authorization (EUA). This EUA will remain  ?in effect (meaning this test can be used) for the duration of the ?COVID-19 declaration under Section 564(b)(1) of the Act, 21 ?U.S.C.section 360bbb-3(b)(1), unless the authorization is terminated  ?or revoked sooner.  ? ? ?  ? Influenza A by PCR NEGATIVE NEGATIVE Final  ? Influenza B by PCR NEGATIVE NEGATIVE Final  ?  Comment: (NOTE) ?The Xpert Xpress SARS-CoV-2/FLU/RSV plus assay is intended as an aid ?in the diagnosis of influenza from Nasopharyngeal swab specimens and ?should not be used as a sole basis for treatment. Nasal washings and ?aspirates are unacceptable for Xpert Xpress SARS-CoV-2/FLU/RSV ?testing. ? ?Fact Sheet for Patients: ?EntrepreneurPulse.com.au ? ?Fact Sheet for Healthcare Providers: ?IncredibleEmployment.be ? ?This test is not yet approved or cleared by the [place] FDA and ?has been authorized for detection and/or diagnosis of SARS-CoV-2 by ?FDA under an Emergency Use Authorization (EUA). This EUA will remain ?in effect (meaning this test can be used) for the duration of the ?COVID-19 declaration under Section 564(b)(1) of the Act, 21 U.S.C. ?section 360bbb-3(b)(1), unless the authorization is terminated or ?revoked. ? ?Performed at [facility], [place], ?[place] [address] ?  ? ? ?Coagulation Studies: ?No results for input(s): LABPROT, INR in the last 72 hours. ? ?Urinalysis: ?No results for input(s): COLORURINE, LABSPEC, [place], GLUCOSEU, HGBUR, BILIRUBINUR, KETONESUR, PROTEINUR, UROBILINOGEN, NITRITE, LEUKOCYTESUR in the last 72 hours. ? ?Invalid input(s): APPERANCEUR  ? ? ?Imaging: ?PERIPHERAL VASCULAR CATHETERIZATION ? ?Result Date: 01/04/2022 ?See surgical note for result.  ? ? ?Medications:  ? ? ? ?  stroke: early stages of recovery book   Does not apply Once  ? aspirin EC  81 mg Oral Daily  ? atorvastatin  80 mg Oral QPM  ? Chlorhexidine Gluconate Cloth  6 each Topical Q0600   ? clopidogrel  75 mg Oral Daily  ? feeding supplement (NEPRO CARB STEADY)  237 mL Oral QHS  ? gabapentin  300 mg Oral QHS  ? heparin  5,000 Units Subcutaneous Q8H  ? insulin aspart  0-20 Units Subcutaneou

## 2022-01-05 NOTE — Progress Notes (Signed)
PT Cancellation Note ? ?Patient Details ?Name: Deanna [name] ?MRN: [id number] ?DOB: 1951/01/12 ? ? ?Cancelled Treatment:    Reason Eval/Treat Not Completed: Patient at procedure or test/unavailable. Pt off floor for HD. Will continue to follow and intervene as medically appropriate and available. ? ? ?Salem Caster. Fairly IV, PT, DPT ?Physical Therapist- [place]  ?[facility]  ?01/05/2022, 12:48 PM ?

## 2022-01-06 DIAGNOSIS — R531 Weakness: Secondary | ICD-10-CM | POA: Diagnosis not present

## 2022-01-06 LAB — RENAL FUNCTION PANEL
Albumin: 2.4 g/dL — ABNORMAL LOW (ref 3.5–5.0)
Anion gap: 9 (ref 5–15)
BUN: 68 mg/dL — ABNORMAL HIGH (ref 8–23)
CO2: 25 mmol/L (ref 22–32)
Calcium: 8.3 mg/dL — ABNORMAL LOW (ref 8.9–10.3)
Chloride: 101 mmol/L (ref 98–111)
Creatinine, Ser: 2.88 mg/dL — ABNORMAL HIGH (ref 0.44–1.00)
GFR, Estimated: 17 mL/min — ABNORMAL LOW (ref >60)
Glucose, Bld: 142 mg/dL — ABNORMAL HIGH (ref 70–99)
Phosphorus: 5.3 mg/dL — ABNORMAL HIGH (ref 2.5–4.6)
Potassium: 5.1 mmol/L (ref 3.5–5.1)
Sodium: 135 mmol/L (ref 135–145)

## 2022-01-06 LAB — CBC
HCT: 23.1 % — ABNORMAL LOW (ref 36.0–46.0)
HCT: 20.4 % — ABNORMAL LOW (ref 36.0–46.0)
Hemoglobin: 7.2 g/dL — ABNORMAL LOW (ref 12.0–15.0)
Hemoglobin: 6.5 g/dL — ABNORMAL LOW (ref 12.0–15.0)
MCH: 26.7 pg (ref 26.0–34.0)
MCH: 27 pg (ref 26.0–34.0)
MCHC: 31.2 g/dL (ref 30.0–36.0)
MCHC: 31.9 g/dL (ref 30.0–36.0)
MCV: 85.6 fL (ref 80.0–100.0)
MCV: 84.6 fL (ref 80.0–100.0)
Platelets: 286 10*3/uL (ref 150–400)
Platelets: 276 10*3/uL (ref 150–400)
RBC: 2.7 MIL/uL — ABNORMAL LOW (ref 3.87–5.11)
RBC: 2.41 MIL/uL — ABNORMAL LOW (ref 3.87–5.11)
RDW: 14.6 % (ref 11.5–15.5)
RDW: 14.8 % (ref 11.5–15.5)
WBC: 16.8 10*3/uL — ABNORMAL HIGH (ref 4.0–10.5)
WBC: 14.9 10*3/uL — ABNORMAL HIGH (ref 4.0–10.5)
nRBC: 0 % (ref 0.0–0.2)
nRBC: 0 % (ref 0.0–0.2)

## 2022-01-06 LAB — GLUCOSE, CAPILLARY
Glucose-Capillary: 187 mg/dL — ABNORMAL HIGH (ref 70–99)
Glucose-Capillary: 169 mg/dL — ABNORMAL HIGH (ref 70–99)
Glucose-Capillary: 126 mg/dL — ABNORMAL HIGH (ref 70–99)

## 2022-01-06 LAB — ABO/RH: ABO/RH(D): A NEG

## 2022-01-06 LAB — PREPARE RBC (CROSSMATCH)

## 2022-01-06 LAB — MAGNESIUM: Magnesium: 1.7 mg/dL (ref 1.7–2.4)

## 2022-01-06 LAB — PHOSPHORUS: Phosphorus: 5.3 mg/dL — ABNORMAL HIGH (ref 2.5–4.6)

## 2022-01-06 MED ORDER — HEPARIN SODIUM (PORCINE) 1000 UNIT/ML IJ SOLN
INTRAMUSCULAR | Status: AC
  Administered 2022-01-06: 3600 [IU]
  Filled 2022-01-06: qty 10

## 2022-01-06 MED ORDER — SODIUM CHLORIDE 0.9% IV SOLUTION: Freq: Once | INTRAVENOUS | Status: DC

## 2022-01-06 NOTE — Progress Notes (Addendum)
Hemodialysis Post Treatment Note: ? ?Tx date:01/06/2022 ?Tx time:3 hours ?Access:Right femoral catheter ?UF Removed: 0.5L ? ?Note: ? ?HD completed tolerated well, no complaints. Blood transfusion was also completed during the treatment.  No complications. Patient asymptomatic. ? ? ? ? ? ? ? ? ?  ?

## 2022-01-06 NOTE — TOC Progression Note (Signed)
Transition of Care (TOC) - Progression Note  ? ? ?Patient Details  ?Name: Deanna [name] ?MRN: [id number] ?Date of Birth: Feb 26, 1951 ? ?Transition of Care (TOC) CM/SW Contact  ?Pete Pelt, RN ?Phone Number: ?01/06/2022, 3:44 PM ? ?Clinical Narrative:   Continued medical workup has hh and DME   Discharge TBD ? ? ? ?Expected Discharge Plan: [place] ?Barriers to Discharge: Continued Medical Work up ? ?Expected Discharge Plan and Services ?Expected Discharge Plan: [place] ?  ?Discharge Planning Services: CM Consult ?Post Acute Care Choice: Durable Medical Equipment, Home Health ?Living arrangements for the past 2 months: [place] ?                ?DME Arranged: 3-N-1 ?DME Agency: [employer] ?Date DME Agency Contacted: 12/29/21 ?Time DME Agency Contacted: [phone] ?Representative spoke with at DME Agency: Suanne Marker ?HH Arranged: OT, PT ?  ?  ?  ?  ? ? ?Social Determinants of Health (SDOH) Interventions ?  ? ?Readmission Risk Interventions ? ?  12/29/2021  ?  2:33 PM  ?Readmission Risk Prevention Plan  ?Transportation Screening Complete  ?PCP or Specialist Appt within 5-7 Days Complete  ?Home Care Screening Complete  ?Medication Review (RN CM) Complete  ? ? ?

## 2022-01-06 NOTE — Progress Notes (Signed)
?[facility]  ?ROUNDING NOTE  ? ?Subjective:  ? ?Patient seen and evaluated at bedside today. ?Daughter at bedside. ?Patient was a bit upset as she states that she did not understand why she was receiving a blood transfusion. ?We advised the patient that hemoglobin had dropped to 6.5 and that it was advisable to obtain blood transfusion today. ?After discussion she agreed to take the blood transfusion. ?Patient also due for hemodialysis treatment today, this would be her third treatment. ? ? ?Objective:  ?Vital signs in last 24 hours:  ?Temp:  [98.1 ?F (36.7 ?C)-99.5 ?F (37.5 ?C)] 98.5 ?F (36.9 ?C) (04/14 1800) ?Pulse Rate:  [87-101] 87 (04/14 1830) ?Resp:  [17-24] 21 (04/14 1830) ?BP: (119-144)/(55-75) 136/67 (04/14 1830) ?SpO2:  [97 %-100 %] 99 % (04/14 1830) ?Weight:  [82.2 kg] 82.2 kg (04/14 1557) ? ?Weight change: -0.5 kg ?Filed Weights  ? 01/05/22 1012 01/05/22 1327 01/06/22 1557  ?Weight: 81.6 kg 81.3 kg 82.2 kg  ? ? ?Intake/Output: ?I/O last 3 completed shifts: ?In: 63 [P.O.:237] ?Out: 0  ?  ?Intake/Output this shift: ? Total I/O ?In: 450 [Blood:450] ?Out: -  ? ?Physical Exam: ?General: NAD, laying in bed  ?Head: Normocephalic, atraumatic. Moist oral mucosal membranes  ?Eyes: Anicteric  ?Lungs:  Clear to auscultation, normal effort  ?Heart: Regular rate and rhythm  ?Abdomen:  Soft, nontender  ?Extremities: No peripheral edema.  ?Neurologic: Nonfocal, moving all four extremities  ?Skin: No lesions  ?Access: Right femoral nontunneled hemodialysis catheter  ? ? ?Basic Metabolic Panel: ?Recent Labs  ?Lab 01/02/22 ?8144 01/02/22 ?1047 01/02/22 ?2227 01/03/22 ?8185 01/04/22 ?0422 01/04/22 ?1225 01/04/22 ?1647 01/05/22 ?6314 01/06/22 ?0431  ?NA 127*  --   --  129* 126*  --   --  131* 135  ?K 5.5*   < >  --  6.1* 6.7*  --  5.5* 5.5* 5.1  ?CL 94*  --   --  98 96*  --   --  99 101  ?CO2 22  --   --  21* 20*  --   --  23 25  ?GLUCOSE 527*  --  497* 454* 419*  --   --  251* 142*  ?BUN 71*  --   --  82* 90*  101*  --  80* 68*  ?CREATININE 3.58*  --   --  3.87* 4.29* 4.50*  --  3.65* 2.88*  ?CALCIUM 8.0*  --   --  8.4* 8.3*  --   --  8.1* 8.3*  ?MG  --   --   --   --   --   --   --  1.6* 1.7  ?PHOS  --   --   --   --   --   --   --  5.0* 5.3*  5.3*  ? < > = values in this interval not displayed.  ? ? ? ?Liver Function Tests: ?Recent Labs  ?Lab 01/05/22 ?0313 01/06/22 ?0431  ?AST 94*  --   ?ALT 152*  --   ?ALKPHOS 152*  --   ?BILITOT 0.5  --   ?PROT 6.2*  --   ?ALBUMIN 2.3* 2.4*  ? ? ?No results for input(s): LIPASE, AMYLASE in the last 168 hours. ?No results for input(s): AMMONIA in the last 168 hours. ? ?CBC: ?Recent Labs  ?Lab 01/03/22 ?9702 01/04/22 ?0422 01/05/22 ?6378 01/06/22 ?0431 01/06/22 ?1015  ?WBC 16.7* 15.7* 16.1* 16.8* 14.9*  ?HGB 8.3* 8.1* 7.6* 7.2* 6.5*  ?HCT 27.1*  26.3* 24.8* 23.1* 20.4*  ?MCV 85.0 84.6 84.4 85.6 84.6  ?PLT 321 338 330 286 276  ? ? ? ?Cardiac Enzymes: ?No results for input(s): CKTOTAL, CKMB, CKMBINDEX, TROPONINI in the last 168 hours. ? ?BNP: ?Invalid input(s): POCBNP ? ?CBG: ?Recent Labs  ?Lab 01/05/22 ?1324 01/05/22 ?1558 01/05/22 ?2129 01/06/22 ?0749 01/06/22 ?1200  ?GLUCAP 123* 127* 112* 187* 169*  ? ? ? ?Microbiology: ?Results for orders placed or performed during the hospital encounter of 12/26/21  ?Respiratory (~20 pathogens) panel by PCR     Status: Abnormal  ? Collection Time: 12/27/21 11:32 AM  ? Specimen: Nasopharyngeal Swab; Respiratory  ?Result Value Ref Range Status  ? Adenovirus NOT DETECTED NOT DETECTED Final  ? Coronavirus 229E NOT DETECTED NOT DETECTED Final  ?  Comment: (NOTE) ?The Coronavirus on the Respiratory Panel, DOES NOT test for the novel  ?Coronavirus (2019 nCoV) ?  ? Coronavirus HKU1 NOT DETECTED NOT DETECTED Final  ? Coronavirus NL63 NOT DETECTED NOT DETECTED Final  ? Coronavirus OC43 NOT DETECTED NOT DETECTED Final  ? Metapneumovirus NOT DETECTED NOT DETECTED Final  ? Rhinovirus / Enterovirus NOT DETECTED NOT DETECTED Final  ? Influenza A NOT DETECTED NOT  DETECTED Final  ? Influenza B NOT DETECTED NOT DETECTED Final  ? Parainfluenza Virus 1 NOT DETECTED NOT DETECTED Final  ? Parainfluenza Virus 2 NOT DETECTED NOT DETECTED Final  ? Parainfluenza Virus 3 DETECTED (A) NOT DETECTED Final  ? Parainfluenza Virus 4 NOT DETECTED NOT DETECTED Final  ? Respiratory Syncytial Virus NOT DETECTED NOT DETECTED Final  ? Bordetella pertussis NOT DETECTED NOT DETECTED Final  ? Bordetella Parapertussis NOT DETECTED NOT DETECTED Final  ? Chlamydophila pneumoniae NOT DETECTED NOT DETECTED Final  ? Mycoplasma pneumoniae NOT DETECTED NOT DETECTED Final  ?  Comment: Performed at [facility], [place] [address] [place], [place] [address]  ?Resp Panel by RT-PCR (Flu A&B, Covid) Nasopharyngeal Swab     Status: None  ? Collection Time: 12/27/21 11:32 AM  ? Specimen: Nasopharyngeal Swab; Nasopharyngeal(NP) swabs in vial transport medium  ?Result Value Ref Range Status  ? SARS Coronavirus 2 by RT PCR NEGATIVE NEGATIVE Final  ?  Comment: (NOTE) ?SARS-CoV-2 target nucleic acids are NOT DETECTED. ? ?The SARS-CoV-2 RNA is generally detectable in upper respiratory ?specimens during the acute phase of infection. The lowest ?concentration of SARS-CoV-2 viral copies this assay can detect is ?138 copies/mL. A negative result does not preclude SARS-Cov-2 ?infection and should not be used as the sole basis for treatment or ?other patient management decisions. A negative result may occur with  ?improper specimen collection/handling, submission of specimen other ?than nasopharyngeal swab, presence of viral mutation(s) within the ?areas targeted by this assay, and inadequate number of viral ?copies(<138 copies/mL). A negative result must be combined with ?clinical observations, patient history, and epidemiological ?information. The expected result is Negative. ? ?Fact Sheet for Patients:  ?EntrepreneurPulse.com.au ? ?Fact Sheet for Healthcare Providers:   ?IncredibleEmployment.be ? ?This test is no t yet approved or cleared by the [place] FDA and  ?has been authorized for detection and/or diagnosis of SARS-CoV-2 by ?FDA under an Emergency Use Authorization (EUA). This EUA will remain  ?in effect (meaning this test can be used) for the duration of the ?COVID-19 declaration under Section 564(b)(1) of the Act, 21 ?U.S.C.section 360bbb-3(b)(1), unless the authorization is terminated  ?or revoked sooner.  ? ? ?  ? Influenza A by PCR NEGATIVE NEGATIVE Final  ? Influenza B by PCR NEGATIVE  NEGATIVE Final  ?  Comment: (NOTE) ?The Xpert Xpress SARS-CoV-2/FLU/RSV plus assay is intended as an aid ?in the diagnosis of influenza from Nasopharyngeal swab specimens and ?should not be used as a sole basis for treatment. Nasal washings and ?aspirates are unacceptable for Xpert Xpress SARS-CoV-2/FLU/RSV ?testing. ? ?Fact Sheet for Patients: ?EntrepreneurPulse.com.au ? ?Fact Sheet for Healthcare Providers: ?IncredibleEmployment.be ? ?This test is not yet approved or cleared by the [place] FDA and ?has been authorized for detection and/or diagnosis of SARS-CoV-2 by ?FDA under an Emergency Use Authorization (EUA). This EUA will remain ?in effect (meaning this test can be used) for the duration of the ?COVID-19 declaration under Section 564(b)(1) of the Act, 21 U.S.C. ?section 360bbb-3(b)(1), unless the authorization is terminated or ?revoked. ? ?Performed at [facility], [place], ?[place] [address] ?  ? ? ?Coagulation Studies: ?No results for input(s): LABPROT, INR in the last 72 hours. ? ?Urinalysis: ?No results for input(s): COLORURINE, LABSPEC, [place], GLUCOSEU, HGBUR, BILIRUBINUR, KETONESUR, PROTEINUR, UROBILINOGEN, NITRITE, LEUKOCYTESUR in the last 72 hours. ? ?Invalid input(s): APPERANCEUR  ? ? ?Imaging: ?No results found. ? ? ?Medications:  ? ? ? ?  stroke: early stages of recovery book    Does not apply Once  ? sodium chloride   Intravenous Once  ? aspirin EC  81 mg Oral Daily  ? atorvastatin  80 mg Oral QPM  ? Chlorhexidine Gluconate Cloth  6 each Topical Q0600  ? clopidogrel  75 mg Oral Daily  ? epoetin (EPOGEN/PROCRIT)

## 2022-01-06 NOTE — Progress Notes (Signed)
OT Cancellation Note ? ?Patient Details ?Name: Deanna [name] ?MRN: [id number] ?DOB: Sep 05, 1951 ? ? ?Cancelled Treatment:    Reason Eval/Treat Not Completed: Medical issues which prohibited therapy. Upon attempt, pt noted with drop in Hgb, currently 6.5 and receiving a blood transfusion. Will hold OT tx at this time and re-attempt at later date/time as medically appropriate.  ? ?Ardeth Perfect., MPH, MS, OTR/L ?ascom [phone] ?01/06/22, 2:46 PM ? ?

## 2022-01-06 NOTE — Progress Notes (Signed)
?PROGRESS NOTE ? ?Deanna [name] NLZ:[id number] DOB: [birth year] DOA: 12/26/2021 ?PCP: [facility] [place] ? ? LOS: 9 days  ? ?Brief Narrative / Interim history: ?71 year old female with past medical history of CAD, hypertension and chronic kidney disease stage IV.  She presents with right-sided numbness and weakness. MRI of the brain shows a punctate focus of reduced diffusion within the right superior pericallosal white matter.  Case discussed with neurology and this likely would not be causing her right-sided weakness so MRI of the cervical and thoracic spine ordered an MRA of the brain ordered. While I was in the room the patient had a vasovagal event where she got dizzy and lightheaded and was seeing spots.  This resolved quickly.  The patient was not orthostatic before this and blood pressure was okay during the events. ? ?Subjective / 24h Interval events: ?Still little bit weak on the right side but ambulatory and can move around in the room. ? ?Assesement and Plan: ?Principal Problem: ?  Right sided weakness ?Active Problems: ?  Acute CVA (cerebrovascular accident) ([place]) ?  Acute kidney injury superimposed on chronic kidney disease ([place]) ?  Lobar pneumonia ([place]) ?  Essential (primary) hypertension ?  HFrEF (heart failure with reduced ejection fraction) ([place]) ?  Type 2 diabetes mellitus without complications ([place]) ?  Hypokalemia ?  Anemia ?  CVA (cerebral vascular accident) [facility]) ? ? ?Assessment and Plan: ?Principal problem ?Right sided weakness -Right-sided weakness not explained by MRI of the brain.  She underwent a C-spine MRI which showed mild spinal canal stenosis and moderate right neuroforaminal stenosis at C6-7, severe right C5-6 neuroforaminal stenosis.  Neurology and neurosurgery evaluated, it is not felt to be related to C-spine, and this could be further addressed as an outpatient.  Stable, continue physical therapy ? ?Active problems ?Acute CVA (cerebrovascular accident) [facility])  -neurology consulted and followed patient while hospitalized.  Underwent a full work-up, currently recommending aspirin and Plavix for 21 days and aspirin alone.  Continue statin along with dual antiplatelets ? ?Acute kidney injury superimposed on chronic kidney disease ([place]) -Acute kidney injury on chronic kidney disease stage IV.  Nephrology consulted.  She had persistent hyperkalemia that did not respond to medications, eventually vascular surgery was consulted and underwent a dialysis catheter placement on 4/12.  She underwent HD 4/12, 4/13.  Potassium better this morning but still on the high side.  Management per nephrology, continue to closely monitor ?Lobar pneumonia ([place]) -Right lobar pneumonia possibly due to parainfluenza.  Also completed a course of ceftriaxone and azithromycin.  ? ?Anemia -due to CKD, hemoglobin a little bit lower this morning, no bleeding, continue to monitor ? ?Hypokalemia / hyperkalemia -Potassium 3.2 on presentation, now persistently hyperkalemic despite treatment.  HD 4/12 and again today/13.  Monitor potassium closely ? ?Type 2 diabetes mellitus without complications ([facility]) -continue glargine, continue NovoLog, CBGs overall stable ? ?CBG (last 3)  ?Recent Labs  ?  01/05/22 ?1558 01/05/22 ?2129 01/06/22 ?0749  ?GLUCAP 127* 112* 187*  ? ? ?HFrEF (heart failure with reduced ejection fraction) (HCC) -EF 30-35%, hold diuretics with acute kidney injury.  She underwent dialysis x2, appears more euvolemic this morning. ? ?Essential (primary) hypertension -continue toprol, blood pressure stable ? ?Scheduled Meds: ?  stroke: early stages of recovery book   Does not apply Once  ? aspirin EC  81 mg Oral Daily  ? atorvastatin  80 mg Oral QPM  ? Chlorhexidine Gluconate Cloth  6 each Topical Q0600  ? clopidogrel  75 mg Oral Daily  ? epoetin (EPOGEN/PROCRIT) injection  20,000 Units Subcutaneous Weekly  ? feeding supplement (NEPRO CARB STEADY)  237 mL Oral QHS  ? gabapentin  300 mg Oral QHS  ?  heparin  5,000 Units Subcutaneous Q8H  ? insulin aspart  0-20 Units Subcutaneous TID WC  ? insulin aspart  0-5 Units Subcutaneous QHS  ? insulin aspart  4 Units Subcutaneous TID WC  ? insulin glargine-yfgn  35 Units Subcutaneous Daily  ? metoprolol succinate  25 mg Oral QHS  ? multivitamin  1 tablet Oral QHS  ? patiromer  16.8 g Oral Daily  ? ?Continuous Infusions: ? ? ?PRN Meds:.acetaminophen **OR** acetaminophen (TYLENOL) oral liquid 160 mg/5 mL **OR** acetaminophen, benzonatate, guaiFENesin-dextromethorphan, HYDROmorphone (DILAUDID) injection, morphine injection, ondansetron (ZOFRAN) IV, ondansetron (ZOFRAN) IV, oxyCODONE ? ?Diet Orders (From admission, onward)  ? ?  Start     Ordered  ? 01/04/22 1411  Diet renal/carb modified with fluid restriction Diet-HS Snack? Nothing; Fluid restriction: 1200 mL Fluid; Room service appropriate? Yes; Fluid consistency: Thin  Diet effective now       ?Question Answer Comment  ?Diet-HS Snack? Nothing   ?Fluid restriction: 1200 mL Fluid   ?Room service appropriate? Yes   ?Fluid consistency: Thin   ?  ? 01/04/22 1410  ? ?  ?  ? ?  ? ? ?DVT prophylaxis: Place and maintain sequential compression device Start: 12/28/21 1332 ?heparin injection 5,000 Units Start: 12/27/21 0200 ? ? ?Lab Results  ?Component Value Date  ? PLT 286 01/06/2022  ? ? ?  Code Status: Full Code ? ?Family Communication: no family at bedside  ? ?Status is: Inpatient ? ?Remains inpatient appropriate because: needs HD ? ?Level of care: Med-Surg ? ?Consultants:  ?Nephrology  ?Neurology ?Neurosurgery  ? ? ?Objective: ?Vitals:  ? 01/05/22 1653 01/05/22 2057 01/06/22 0405 01/06/22 0748  ?BP: (!) 135/49 (!) 119/55 140/60 139/61  ?Pulse: 91 88 100 (!) 101  ?Resp: 17   17  ?Temp: 98.9 ?F (37.2 ?C) 98.1 ?F (36.7 ?C) 98.9 ?F (37.2 ?C) 99.5 ?F (37.5 ?C)  ?TempSrc:   Oral   ?SpO2: 99% 97% 97% 99%  ?Weight:      ?Height:      ? ? ?Intake/Output Summary (Last 24 hours) at 01/06/2022 0756 ?Last data filed at 01/05/2022 2151 ?Gross  per 24 hour  ?Intake 237 ml  ?Output 0 ml  ?Net 237 ml  ? ? ?Wt Readings from Last 3 Encounters:  ?01/05/22 81.3 kg  ?12/09/17 83.9 kg  ?03/01/16 78.5 kg  ? ? ?Examination: ? ?Constitutional: NAD ?Eyes: lids and conjunctivae normal, no scleral icterus ?ENMT: mmm ?Neck: normal, supple ?Respiratory: clear to auscultation bilaterally, no wheezing, no crackles. Normal respiratory effort.  ?Cardiovascular: Regular rate and rhythm, no murmurs / rubs / gallops. No LE edema. ?Abdomen: soft, no distention, no tenderness. Bowel sounds positive.  ?Skin: no rashes ?Neurologic: no focal deficits, equal strength ? ? ?Data Reviewed: I have independently reviewed following labs and imaging studies  ? ?CBC ?Recent Labs  ?Lab 01/02/22 ?5176 01/03/22 ?1607 01/04/22 ?0422 01/05/22 ?3710 01/06/22 ?0431  ?WBC 14.3* 16.7* 15.7* 16.1* 16.8*  ?HGB 8.9* 8.3* 8.1* 7.6* 7.2*  ?HCT 28.7* 27.1* 26.3* 24.8* 23.1*  ?PLT 349 321 338 330 286  ?MCV 84.4 85.0 84.6 84.4 85.6  ?MCH 26.2 26.0 26.0 25.9* 26.7  ?MCHC 31.0 30.6 30.8 30.6 31.2  ?RDW 14.1 14.1 14.4 14.5 14.6  ? ? ? ?Recent Labs  ?Lab 01/02/22 ?6269 01/02/22 ?  1047 01/02/22 ?2227 01/03/22 ?2202 01/04/22 ?0422 01/04/22 ?1225 01/04/22 ?1647 01/05/22 ?5427 01/06/22 ?0431  ?NA 127*  --   --  129* 126*  --   --  131* 135  ?K 5.5*   < >  --  6.1* 6.7*  --  5.5* 5.5* 5.1  ?CL 94*  --   --  98 96*  --   --  99 101  ?CO2 22  --   --  21* 20*  --   --  23 25  ?GLUCOSE 527*  --  497* 454* 419*  --   --  251* 142*  ?BUN 71*  --   --  82* 90* 101*  --  80* 68*  ?CREATININE 3.58*  --   --  3.87* 4.29* 4.50*  --  3.65* 2.88*  ?CALCIUM 8.0*  --   --  8.4* 8.3*  --   --  8.1* 8.3*  ?AST  --   --   --   --   --   --   --  94*  --   ?ALT  --   --   --   --   --   --   --  152*  --   ?ALKPHOS  --   --   --   --   --   --   --  152*  --   ?BILITOT  --   --   --   --   --   --   --  0.5  --   ?ALBUMIN  --   --   --   --   --   --   --  2.3* 2.4*  ?MG  --   --   --   --   --   --   --  1.6* 1.7  ? < > = values in  this interval not displayed.  ? ? ? ?------------------------------------------------------------------------------------------------------------------ ?No results for input(s): CHOL, HDL, LDLCALC, TRIG, CHOLHD

## 2022-01-06 NOTE — Progress Notes (Signed)
PT Cancellation Note ? ?Patient Details ?Name: Deanna [name] ?MRN: [id number] ?DOB: Mar 12, 1951 ? ? ?Cancelled Treatment:    Reason Eval/Treat Not Completed: Medical issues which prohibited therapy. Per EMR down trending Hg noted from 7.2 at 0431 to 6.5 at 10:15. Exertional activity contraindicated at this time. PT to re-attempt as able when medically appropriate. ? ? ?Salem Caster. Fairly IV, PT, DPT ?Physical Therapist- [place]  ?[facility]  ?01/06/2022, 1:31 PM ?

## 2022-01-07 ENCOUNTER — Inpatient Hospital Stay: Payer: Medicare HMO

## 2022-01-07 DIAGNOSIS — R109 Unspecified abdominal pain: Secondary | ICD-10-CM

## 2022-01-07 DIAGNOSIS — R10A1 Flank pain, right side: Secondary | ICD-10-CM

## 2022-01-07 DIAGNOSIS — R531 Weakness: Secondary | ICD-10-CM | POA: Diagnosis not present

## 2022-01-07 DIAGNOSIS — R29898 Other symptoms and signs involving the musculoskeletal system: Secondary | ICD-10-CM | POA: Diagnosis present

## 2022-01-07 DIAGNOSIS — R200 Anesthesia of skin: Secondary | ICD-10-CM

## 2022-01-07 LAB — COMPREHENSIVE METABOLIC PANEL
ALT: 174 U/L — ABNORMAL HIGH (ref 0–44)
ALT: 192 U/L — ABNORMAL HIGH (ref 0–44)
AST: 99 U/L — ABNORMAL HIGH (ref 15–41)
AST: 155 U/L — ABNORMAL HIGH (ref 15–41)
Albumin: 2.5 g/dL — ABNORMAL LOW (ref 3.5–5.0)
Albumin: 2.5 g/dL — ABNORMAL LOW (ref 3.5–5.0)
Alkaline Phosphatase: 178 U/L — ABNORMAL HIGH (ref 38–126)
Alkaline Phosphatase: 227 U/L — ABNORMAL HIGH (ref 38–126)
Anion gap: 9 (ref 5–15)
Anion gap: 10 (ref 5–15)
BUN: 42 mg/dL — ABNORMAL HIGH (ref 8–23)
BUN: 48 mg/dL — ABNORMAL HIGH (ref 8–23)
CO2: 29 mmol/L (ref 22–32)
CO2: 27 mmol/L (ref 22–32)
Calcium: 8.5 mg/dL — ABNORMAL LOW (ref 8.9–10.3)
Calcium: 8.1 mg/dL — ABNORMAL LOW (ref 8.9–10.3)
Chloride: 99 mmol/L (ref 98–111)
Chloride: 99 mmol/L (ref 98–111)
Creatinine, Ser: 2.11 mg/dL — ABNORMAL HIGH (ref 0.44–1.00)
Creatinine, Ser: 2.34 mg/dL — ABNORMAL HIGH (ref 0.44–1.00)
GFR, Estimated: 25 mL/min — ABNORMAL LOW (ref >60)
GFR, Estimated: 22 mL/min — ABNORMAL LOW (ref >60)
Glucose, Bld: 59 mg/dL — ABNORMAL LOW (ref 70–99)
Glucose, Bld: 243 mg/dL — ABNORMAL HIGH (ref 70–99)
Potassium: 4.9 mmol/L (ref 3.5–5.1)
Potassium: 5.3 mmol/L — ABNORMAL HIGH (ref 3.5–5.1)
Sodium: 137 mmol/L (ref 135–145)
Sodium: 136 mmol/L (ref 135–145)
Total Bilirubin: 0.5 mg/dL (ref 0.3–1.2)
Total Bilirubin: 0.7 mg/dL (ref 0.3–1.2)
Total Protein: 6.3 g/dL — ABNORMAL LOW (ref 6.5–8.1)
Total Protein: 6.5 g/dL (ref 6.5–8.1)

## 2022-01-07 LAB — CBC
HCT: 27.4 % — ABNORMAL LOW (ref 36.0–46.0)
HCT: 27.9 % — ABNORMAL LOW (ref 36.0–46.0)
Hemoglobin: 8.9 g/dL — ABNORMAL LOW (ref 12.0–15.0)
Hemoglobin: 8.7 g/dL — ABNORMAL LOW (ref 12.0–15.0)
MCH: 27.1 pg (ref 26.0–34.0)
MCH: 26.9 pg (ref 26.0–34.0)
MCHC: 32.5 g/dL (ref 30.0–36.0)
MCHC: 31.2 g/dL (ref 30.0–36.0)
MCV: 83.3 fL (ref 80.0–100.0)
MCV: 86.1 fL (ref 80.0–100.0)
Platelets: 265 10*3/uL (ref 150–400)
Platelets: 253 10*3/uL (ref 150–400)
RBC: 3.29 MIL/uL — ABNORMAL LOW (ref 3.87–5.11)
RBC: 3.24 MIL/uL — ABNORMAL LOW (ref 3.87–5.11)
RDW: 14.6 % (ref 11.5–15.5)
RDW: 15 % (ref 11.5–15.5)
WBC: 20.4 10*3/uL — ABNORMAL HIGH (ref 4.0–10.5)
WBC: 25.1 10*3/uL — ABNORMAL HIGH (ref 4.0–10.5)
nRBC: 0.1 % (ref 0.0–0.2)
nRBC: 0.1 % (ref 0.0–0.2)

## 2022-01-07 LAB — GLUCOSE, CAPILLARY
Glucose-Capillary: 66 mg/dL — ABNORMAL LOW (ref 70–99)
Glucose-Capillary: 150 mg/dL — ABNORMAL HIGH (ref 70–99)
Glucose-Capillary: 63 mg/dL — ABNORMAL LOW (ref 70–99)
Glucose-Capillary: 95 mg/dL (ref 70–99)
Glucose-Capillary: 171 mg/dL — ABNORMAL HIGH (ref 70–99)

## 2022-01-07 LAB — TROPONIN I (HIGH SENSITIVITY)
Troponin I (High Sensitivity): 1329 ng/L (ref <18)
Troponin I (High Sensitivity): 1591 ng/L (ref <18)

## 2022-01-07 LAB — BRAIN NATRIURETIC PEPTIDE: B Natriuretic Peptide: 1709.2 pg/mL — ABNORMAL HIGH (ref 0.0–100.0)

## 2022-01-07 LAB — PROTIME-INR
INR: 1 (ref 0.8–1.2)
Prothrombin Time: 13.3 seconds (ref 11.4–15.2)

## 2022-01-07 LAB — D-DIMER, QUANTITATIVE: D-Dimer, Quant: 2.54 ug/mL-FEU — ABNORMAL HIGH (ref 0.00–0.50)

## 2022-01-07 LAB — MRSA NEXT GEN BY PCR, NASAL: MRSA by PCR Next Gen: NOT DETECTED

## 2022-01-07 LAB — PROCALCITONIN: Procalcitonin: 0.92 ng/mL

## 2022-01-07 MED ORDER — FUROSEMIDE 10 MG/ML IJ SOLN
40.0000 mg | INTRAMUSCULAR | Status: AC
  Administered 2022-01-07: 40 mg via INTRAVENOUS
  Filled 2022-01-07: qty 4

## 2022-01-07 MED ORDER — SODIUM CHLORIDE 0.9 % IV SOLN
2.0000 g | INTRAVENOUS | Status: DC
  Administered 2022-01-07 – 2022-01-10 (×4): 2 g via INTRAVENOUS
  Filled 2022-01-07 (×2): qty 2
  Filled 2022-01-07 (×2): qty 12.5

## 2022-01-07 MED ORDER — HEPARIN (PORCINE) 25000 UT/250ML-% IV SOLN
950.0000 [IU]/h | INTRAVENOUS | Status: AC
  Administered 2022-01-07: 950 [IU]/h via INTRAVENOUS
  Filled 2022-01-07 (×2): qty 250

## 2022-01-07 MED ORDER — VANCOMYCIN HCL 2000 MG/400ML IV SOLN
2000.0000 mg | Freq: Once | INTRAVENOUS | Status: AC
  Administered 2022-01-07: 2000 mg via INTRAVENOUS
  Filled 2022-01-07: qty 400

## 2022-01-07 MED ORDER — VANCOMYCIN HCL 1750 MG/350ML IV SOLN: 1750.0000 mg | INTRAVENOUS | Status: DC

## 2022-01-07 MED ORDER — LABETALOL HCL 5 MG/ML IV SOLN
10.0000 mg | INTRAVENOUS | Status: DC | PRN
  Administered 2022-01-07: 10 mg via INTRAVENOUS
  Filled 2022-01-07: qty 4

## 2022-01-07 MED ORDER — METOPROLOL TARTRATE 5 MG/5ML IV SOLN: 2.5000 mg | INTRAVENOUS | Status: DC | PRN

## 2022-01-07 NOTE — Progress Notes (Signed)
?  Chaplain On-Call responded to Rapid Response notification at 1236 hours. ? ?Patient was being transported to the CT Scan area for procedures. ? ?No family is present. ? ?Chaplain is available for additional support if requested. ? ?Chaplain Pollyann Samples ?M.Div., B[facility] ?

## 2022-01-07 NOTE — Plan of Care (Signed)
Continuing with plan of care. 

## 2022-01-07 NOTE — Progress Notes (Signed)
?   01/07/22 1230  ?Provider Notification  ?Provider Name/Title gherghe  ?Date Provider Notified 01/07/22  ?Time Provider Notified 1230  ?Method of Notification  ?(secure message)  ?Notification Reason Change in status ?(RRT)  ?Provider response Evaluate remotely;See new orders;Other (Comment) ?(page Dr. Manuella Ghazi)  ?Date of Provider Response 01/07/22  ?Time of Provider Response 1230  ?Rapid Response Notification  ?Name of Rapid Response RN Notified [facility] Nails  ?Date Rapid Response Notified 01/07/22  ?Time Rapid Response Notified 1230  ?Note  ?Observations patient complaint of nausea, flank pain, SOB anxious  ? ?Patient called at for Nausea medicine, Zofran '4mg'$  given IV. Patient called out again for pain medication. On assessment patient found diaphoretic, nausea with complaint of right flank pain, with wet congested cough. Morphine '2mg'$  IV administered. Vital signs obtained BP 209/108, HR 134. O2 was was administered Orders received from Dr. Orpah Greek stat EKG, '10mg'$  Labetalol IV administered. RRT called. Dr. Manuella Ghazi took over patient care. CT of chest ordered. Patient took to CT scan then to ICU 5. Bedside report given to receiving nurse. ?

## 2022-01-07 NOTE — Consult Note (Addendum)
? ?CRITICAL CARE PROGRESS NOTE ? ? ? ?Name: Deanna [name] ?MRN: [id number] ?DOB: 03-14-1951 ? ?   ?LOS: 10 ? ? ?SUBJECTIVE FINDINGS & SIGNIFICANT EVENTS  ? ? ?Patient description:  ?71 yo F with HFrEF, CAD, HTN, CKD on hd had rapid response reports acute SOB and Dyspnea with desaturation, had CT done showing cental predominant GGO with R moderate plueral effusion and anasarca. Has R fem HD catheter.  She was initially admitted with viral LRTI due to parainfluenza. PCCM consult for further evaluation and management  ? ?Lines/tubes ?:  ? ?Microbiology/Sepsis markers: ?Results for orders placed or performed during the hospital encounter of 12/26/21  ?Respiratory (~20 pathogens) panel by PCR     Status: Abnormal  ? Collection Time: 12/27/21 11:32 AM  ? Specimen: Nasopharyngeal Swab; Respiratory  ?Result Value Ref Range Status  ? Adenovirus NOT DETECTED NOT DETECTED Final  ? Coronavirus 229E NOT DETECTED NOT DETECTED Final  ?  Comment: (NOTE) ?The Coronavirus on the Respiratory Panel, DOES NOT test for the novel  ?Coronavirus (2019 nCoV) ?  ? Coronavirus HKU1 NOT DETECTED NOT DETECTED Final  ? Coronavirus NL63 NOT DETECTED NOT DETECTED Final  ? Coronavirus OC43 NOT DETECTED NOT DETECTED Final  ? Metapneumovirus NOT DETECTED NOT DETECTED Final  ? Rhinovirus / Enterovirus NOT DETECTED NOT DETECTED Final  ? Influenza A NOT DETECTED NOT DETECTED Final  ? Influenza B NOT DETECTED NOT DETECTED Final  ? Parainfluenza Virus 1 NOT DETECTED NOT DETECTED Final  ? Parainfluenza Virus 2 NOT DETECTED NOT DETECTED Final  ? Parainfluenza Virus 3 DETECTED (A) NOT DETECTED Final  ? Parainfluenza Virus 4 NOT DETECTED NOT DETECTED Final  ? Respiratory Syncytial Virus NOT DETECTED NOT DETECTED Final  ? Bordetella pertussis NOT DETECTED NOT DETECTED Final  ? Bordetella  Parapertussis NOT DETECTED NOT DETECTED Final  ? Chlamydophila pneumoniae NOT DETECTED NOT DETECTED Final  ? Mycoplasma pneumoniae NOT DETECTED NOT DETECTED Final  ?  Comment: Performed at [facility], [place] [address], [place], [place] [address]  ?Resp Panel by RT-PCR (Flu A&B, Covid) Nasopharyngeal Swab     Status: None  ? Collection Time: 12/27/21 11:32 AM  ? Specimen: Nasopharyngeal Swab; Nasopharyngeal(NP) swabs in vial transport medium  ?Result Value Ref Range Status  ? SARS Coronavirus 2 by RT PCR NEGATIVE NEGATIVE Final  ?  Comment: (NOTE) ?SARS-CoV-2 target nucleic acids are NOT DETECTED. ? ?The SARS-CoV-2 RNA is generally detectable in upper respiratory ?specimens during the acute phase of infection. The lowest ?concentration of SARS-CoV-2 viral copies this assay can detect is ?138 copies/mL. A negative result does not preclude SARS-Cov-2 ?infection and should not be used as the sole basis for treatment or ?other patient management decisions. A negative result may occur with  ?improper specimen collection/handling, submission of specimen other ?than nasopharyngeal swab, presence of viral mutation(s) within the ?areas targeted by this assay, and inadequate number of viral ?copies(<138 copies/mL). A negative result must be combined with ?clinical observations, patient history, and epidemiological ?information. The expected result is Negative. ? ?Fact Sheet for Patients:  ?EntrepreneurPulse.com.au ? ?Fact Sheet for Healthcare Providers:  ?IncredibleEmployment.be ? ?This test is no t yet approved or cleared by the [place] FDA and  ?has been authorized for detection and/or diagnosis of SARS-CoV-2 by ?FDA under an Emergency Use Authorization (EUA). This EUA will remain  ?in effect (meaning this test can be used) for the duration of the ?COVID-19 declaration under Section 564(b)(1) of the Act, 21 ?U.S.C.section 360bbb-3(b)(1), unless  the authorization is terminated   ?or revoked sooner.  ? ? ?  ? Influenza A by PCR NEGATIVE NEGATIVE Final  ? Influenza B by PCR NEGATIVE NEGATIVE Final  ?  Comment: (NOTE) ?The Xpert Xpress SARS-CoV-2/FLU/RSV plus assay is intended as an aid ?in the diagnosis of influenza from Nasopharyngeal swab specimens and ?should not be used as a sole basis for treatment. Nasal washings and ?aspirates are unacceptable for Xpert Xpress SARS-CoV-2/FLU/RSV ?testing. ? ?Fact Sheet for Patients: ?EntrepreneurPulse.com.au ? ?Fact Sheet for Healthcare Providers: ?IncredibleEmployment.be ? ?This test is not yet approved or cleared by the [place] FDA and ?has been authorized for detection and/or diagnosis of SARS-CoV-2 by ?FDA under an Emergency Use Authorization (EUA). This EUA will remain ?in effect (meaning this test can be used) for the duration of the ?COVID-19 declaration under Section 564(b)(1) of the Act, 21 U.S.C. ?section 360bbb-3(b)(1), unless the authorization is terminated or ?revoked. ? ?Performed at [facility], [place], ?[place] [address] ?  ?MRSA Next Gen by PCR, Nasal     Status: None  ? Collection Time: 01/07/22  1:34 PM  ? Specimen: Nasal Mucosa; Nasal Swab  ?Result Value Ref Range Status  ? MRSA by PCR Next Gen NOT DETECTED NOT DETECTED Final  ?  Comment: (NOTE) ?The GeneXpert MRSA Assay (FDA approved for NASAL specimens only), ?is one component of a comprehensive MRSA colonization surveillance ?program. It is not intended to diagnose MRSA infection nor to guide ?or monitor treatment for MRSA infections. ?Test performance is not FDA approved in patients less than 2 years ?old. ?Performed at [facility], [place], ?[place] [address] ?  ? ? ?Anti-infectives:  ?Anti-infectives (From admission, onward)  ? ? Start     Dose/Rate Route Frequency Ordered Stop  ? 01/09/22 1500  vancomycin (VANCOREADY) IVPB 1750 mg/350 mL       ? 1,750 mg ?175 mL/hr over 120  Minutes Intravenous Every 48 hours 01/07/22 1436    ? 01/07/22 1530  vancomycin (VANCOREADY) IVPB 2000 mg/400 mL       ? 2,000 mg ?200 mL/hr over 120 Minutes Intravenous  Once 01/07/22 1436    ? 01/07/22 1530  ceFEPIme (MAXIPIME) 2 g in sodium chloride 0.9 % 100 mL IVPB       ? 2 g ?200 mL/hr over 30 Minutes Intravenous Every 24 hours 01/07/22 1436    ? 01/04/22 1204  vancomycin (VANCOCIN) IVPB 1000 mg/200 mL premix  Status:  Discontinued       ? 1,000 mg ?200 mL/hr over 60 Minutes Intravenous 60 min pre-op 01/04/22 1204 01/04/22 1410  ? 12/28/21 1000  azithromycin (ZITHROMAX) tablet 250 mg       ?See Hyperspace for full Linked Orders Report.  ? 250 mg Oral Daily 12/27/21 1114 12/31/21 0813  ? 12/27/21 1200  cefTRIAXone (ROCEPHIN) 2 g in sodium chloride 0.9 % 100 mL IVPB       ? 2 g ?200 mL/hr over 30 Minutes Intravenous Every 24 hours 12/27/21 1114 12/31/21 1300  ? 12/27/21 1200  azithromycin (ZITHROMAX) tablet 500 mg       ?See Hyperspace for full Linked Orders Report.  ? 500 mg Oral Daily 12/27/21 1114 12/27/21 1454  ? ?  ? ? ? ?Consults: ?Treatment Team:  ?Lorenza Chick, MD ?Deetta Perla, MD ?Liana Gerold, MD  ? ? ? ? ?PAST MEDICAL HISTORY  ? ?Past Medical History:  ?Diagnosis Date  ? Coronary artery disease   ?  Hypertension   ? ? ? ?SURGICAL HISTORY  ? ?Past Surgical History:  ?Procedure Laterality Date  ? CARDIAC CATHETERIZATION Left 03/01/2016  ? Procedure: Left Heart Cath and Coronary Angiography;  Surgeon: Teodoro Spray, MD;  Location: [place] [facility];  Service: Cardiovascular;  Laterality: Left;  ? TEMPORARY DIALYSIS CATHETER N/A 01/04/2022  ? Procedure: TEMPORARY DIALYSIS CATHETER;  Surgeon: Algernon Huxley, MD;  Location: [place] [facility];  Service: Cardiovascular;  Laterality: N/A;  ? ? ? ?FAMILY HISTORY  ? ?History reviewed. No pertinent family history. ? ? ?SOCIAL HISTORY  ? ?Social History  ? ?Tobacco Use  ? Smoking status: Never  ?Substance Use Topics  ? Alcohol use: No  ? Drug use:  No  ? ? ? ?MEDICATIONS  ? ?Current Medication: ? ?Current Facility-Administered Medications:  ?   stroke: mapping our early stages of recovery book, , Does not apply, Once, Dew, Erskine Squibb, MD ?  0.9 %  sodium chlor

## 2022-01-07 NOTE — Progress Notes (Addendum)
?PROGRESS NOTE ? ?Deanna [name] OVF:[id number] DOB: [birth year] DOA: 12/26/2021 ?[facility]P: [facility] [place] ? ? LOS: 10 days  ? ?Brief Narrative / Interim history: ?71 year old female with past medical history of CAD, hypertension and chronic kidney disease stage IV.  She presents with right-sided numbness and weakness. MRI of the brain shows a punctate focus of reduced diffusion within the right superior pericallosal white matter.  Case discussed with neurology and this likely would not be causing her right-sided weakness so MRI of the cervical and thoracic spine ordered an MRA of the brain ordered. While I was in the room the patient had a vasovagal event where she got dizzy and lightheaded and was seeing spots.  This resolved quickly.  The patient was not orthostatic before this and blood pressure was okay during the events. ? ?Subjective / 24h Interval events: ?Doing well this morning.  Still complains of right-sided weakness. ? ?Assesement and Plan: ?Principal Problem: ?  Right sided weakness ?Active Problems: ?  Acute CVA (cerebrovascular accident) ([place]) ?  Acute kidney injury superimposed on chronic kidney disease ([place]) ?  Lobar pneumonia ([place]) ?  Essential (primary) hypertension ?  HFrEF (heart failure with reduced ejection fraction) ([place]) ?  Type 2 diabetes mellitus without complications ([place]) ?  Hypokalemia ?  Anemia ?  CVA (cerebral vascular accident) [facility]) ? ? ?Assessment and Plan: ?Principal problem ?Right sided weakness -Right-sided weakness not explained by MRI of the brain.  She underwent a C-spine MRI which showed mild spinal canal stenosis and moderate right neuroforaminal stenosis at C6-7, severe right C5-6 neuroforaminal stenosis.  Neurology and neurosurgery evaluated, it is not felt to be related to C-spine, and this could be further addressed as an outpatient.  Stable, continue physical therapy ? ?Active problems ?Acute CVA (cerebrovascular accident) [facility]) -neurology consulted  and followed patient while hospitalized.  Underwent a full work-up, currently recommending aspirin and Plavix for 21 days and aspirin alone.  Continue statin along with dual antiplatelets ? ?Leukocytosis - closely monitor. Clinically without cough, chest congestion, dysuria or any other symptoms to suggest an infectious process.  Monitor fever curve, currently afebrile ? ?Elevated LFTs -unclear cause, perhaps high-dose statin.  Obtain right upper quadrant ultrasound, hold statin, closely monitor.  She is asymptomatic ? ?Acute kidney injury superimposed on chronic kidney disease ([place]) -Acute kidney injury on chronic kidney disease stage IV.  Nephrology consulted.  She had persistent hyperkalemia that did not respond to medications, eventually vascular surgery was consulted and underwent a dialysis catheter placement on 4/12.  She underwent HD 4/12, 4/13, 4/14.  Monitor off dialysis this weekend to assess for renal recovery ? ?Lobar pneumonia ([place]) -Right lobar pneumonia possibly due to parainfluenza.  Also completed a course of ceftriaxone and azithromycin.  ? ?Anemia -due to CKD, hemoglobin a little bit lower this morning, no bleeding, continue to monitor ? ?Hypokalemia / hyperkalemia -Potassium 3.2 on presentation, now persistently hyperkalemic despite treatment.  Nephrology consulted, underwent HD 4/12, 4/13, 4/14.  Now being monitored off dialysis this weekend to assess for renal recovery ? ?Type 2 diabetes mellitus without complications ([facility]CC) -continue glargine, continue NovoLog, CBGs overall stable ? ?CBG (last 3)  ?Recent Labs  ?  01/06/22 ?0749 01/06/22 ?1200 01/06/22 ?2023  ?GLUCAP 187* 169* 126*  ? ? ?HFrEF (heart failure with reduced ejection fraction) (HCC) -EF 30-35%, hold diuretics with acute kidney injury.  She underwent dialysis x3, currently appears euvolemic ? ?Essential (primary) hypertension -continue toprol, blood pressure stable ? ?Scheduled Meds: ?  stroke: early stages of recovery book   Does  not apply Once  ? sodium chloride   Intravenous Once  ? aspirin EC  81 mg Oral Daily  ? atorvastatin  80 mg Oral QPM  ? Chlorhexidine Gluconate Cloth  6 each Topical Q0600  ? clopidogrel  75 mg Oral Daily  ? epoetin (EPOGEN/PROCRIT) injection  20,000 Units Subcutaneous Weekly  ? feeding supplement (NEPRO CARB STEADY)  237 mL Oral QHS  ? gabapentin  300 mg Oral QHS  ? heparin  5,000 Units Subcutaneous Q8H  ? insulin aspart  0-20 Units Subcutaneous TID WC  ? insulin aspart  0-5 Units Subcutaneous QHS  ? insulin aspart  4 Units Subcutaneous TID WC  ? insulin glargine-yfgn  35 Units Subcutaneous Daily  ? metoprolol succinate  25 mg Oral QHS  ? multivitamin  1 tablet Oral QHS  ? ?Continuous Infusions: ? ? ?PRN Meds:.acetaminophen **OR** acetaminophen (TYLENOL) oral liquid 160 mg/5 mL **OR** acetaminophen, benzonatate, guaiFENesin-dextromethorphan, HYDROmorphone (DILAUDID) injection, morphine injection, ondansetron (ZOFRAN) IV, ondansetron (ZOFRAN) IV, oxyCODONE ? ?Diet Orders (From admission, onward)  ? ?  Start     Ordered  ? 01/04/22 1411  Diet renal/carb modified with fluid restriction Diet-HS Snack? Nothing; Fluid restriction: 1200 mL Fluid; Room service appropriate? Yes; Fluid consistency: Thin  Diet effective now       ?Question Answer Comment  ?Diet-HS Snack? Nothing   ?Fluid restriction: 1200 mL Fluid   ?Room service appropriate? Yes   ?Fluid consistency: Thin   ?  ? 01/04/22 1410  ? ?  ?  ? ?  ? ? ?DVT prophylaxis: Place and maintain sequential compression device Start: 12/28/21 1332 ?heparin injection 5,000 Units Start: 12/27/21 0200 ? ? ?Lab Results  ?Component Value Date  ? PLT 265 01/07/2022  ? ? ?  Code Status: Full Code ? ?Family Communication: no family at bedside  ? ?Status is: Inpatient ? ?Remains inpatient appropriate because: needs HD ? ?Level of care: Med-Surg ? ?Consultants:  ?Nephrology  ?Neurology ?Neurosurgery  ? ? ?Objective: ?Vitals:  ? 01/06/22 1900 01/06/22 1915 01/06/22 2016 01/07/22 0504   ?BP: 132/64 (!) 143/67 (!) 153/74 (!) 153/72  ?Pulse: 88 95 (!) 104 (!) 107  ?Resp: '19 20 18 14  '$ ?Temp:  98.4 ?F (36.9 ?C) 98.7 ?F (37.1 ?C) 98 ?F (36.7 ?C)  ?TempSrc:  Oral Oral   ?SpO2: 99% 100% 99% (!) 89%  ?Weight:      ?Height:      ? ? ?Intake/Output Summary (Last 24 hours) at 01/07/2022 0724 ?Last data filed at 01/06/2022 2100 ?Gross per 24 hour  ?Intake 1167 ml  ?Output 500 ml  ?Net 667 ml  ? ? ?Wt Readings from Last 3 Encounters:  ?01/06/22 82.2 kg  ?12/09/17 83.9 kg  ?03/01/16 78.5 kg  ? ? ?Examination: ? ?Constitutional: NAD ?Eyes: lids and conjunctivae normal, no scleral icterus ?ENMT: mmm ?Neck: normal, supple ?Respiratory: clear to auscultation bilaterally, no wheezing, no crackles. Normal respiratory effort.  ?Cardiovascular: Regular rate and rhythm, no murmurs / rubs / gallops. No LE edema. ?Abdomen: soft, no distention, no tenderness. Bowel sounds positive.  ?Skin: no rashes ? ?Data Reviewed: I have independently reviewed following labs and imaging studies  ? ?CBC ?Recent Labs  ?Lab 01/04/22 ?0422 01/05/22 ?0313 01/06/22 ?0431 01/06/22 ?1015 01/07/22 ?5465  ?WBC 15.7* 16.1* 16.8* 14.9* 20.4*  ?HGB 8.1* 7.6* 7.2* 6.5* 8.9*  ?HCT 26.3* 24.8* 23.1* 20.4* 27.4*  ?PLT 338 330 286 276 265  ?MCV 84.6  84.4 85.6 84.6 83.3  ?MCH 26.0 25.9* 26.7 27.0 27.1  ?MCHC 30.8 30.6 31.2 31.9 32.5  ?RDW 14.4 14.5 14.6 14.8 14.6  ? ? ? ?Recent Labs  ?Lab 01/03/22 ?2902 01/04/22 ?0422 01/04/22 ?1225 01/04/22 ?1647 01/05/22 ?1115 01/06/22 ?5208 01/07/22 ?0223  ?NA 129* 126*  --   --  131* 135 137  ?K 6.1* 6.7*  --  5.5* 5.5* 5.1 4.9  ?CL 98 96*  --   --  99 101 99  ?CO2 21* 20*  --   --  '23 25 29  '$ ?GLUCOSE 454* 419*  --   --  251* 142* 59*  ?BUN 82* 90* 101*  --  80* 68* 42*  ?CREATININE 3.87* 4.29* 4.50*  --  3.65* 2.88* 2.11*  ?CALCIUM 8.4* 8.3*  --   --  8.1* 8.3* 8.5*  ?AST  --   --   --   --  94*  --  99*  ?ALT  --   --   --   --  152*  --  174*  ?ALKPHOS  --   --   --   --  152*  --  178*  ?BILITOT  --   --   --   --   0.5  --  0.5  ?ALBUMIN  --   --   --   --  2.3* 2.4* 2.5*  ?MG  --   --   --   --  1.6* 1.7  --   ? ? ? ?---------------------------------------------------------------------------------------------------

## 2022-01-07 NOTE — Progress Notes (Signed)
PT Cancellation Note ? ?Patient Details ?Name: Marilin Stonerock ?MRN: [id number] ?DOB: 09/11/1951 ? ? ?Cancelled Treatment:    Reason Eval/Treat Not Completed: Medical issues which prohibited therapy. Patient with rapid response this pm. Transferred to CCU. Will need new/continuation order when appropriate. Completing current order. ? ? ?Khylin Gutridge ?01/07/2022, 2:38 PM ?

## 2022-01-07 NOTE — Plan of Care (Signed)
  Problem: Education: Goal: Knowledge of General Education information will improve Description: Including pain rating scale, medication(s)/side effects and non-pharmacologic comfort measures Outcome: Progressing   Problem: Nutrition: Goal: Adequate nutrition will be maintained Outcome: Progressing   

## 2022-01-07 NOTE — Progress Notes (Signed)
Pharmacy Antibiotic Note ? ?Deanna [name] is a 71 y.o. female admitted on 12/26/2021 with pneumonia (HCAP). PMH includes CKD stage 4. Pharmacy has been consulted for vancomycin and cefepime dosing. ? ?Chest xray: patchy consolidation of lung bases, superimposed pneumonia is not excluded. Pt was transferred to ICU after rapid response event. Renal function at baseline (baseline scr 2.61). ? ?Plan: ?Vancomycin 2,000 mg x 1 loading dose ?Vancomycin 1750 mg IV Q 48 hrs.  ?Goal AUC 400-550. ?Expected AUC: 511.4 ?SCr used: 2.11 ? ?Cefepime 2 grams every 24 hours ? ?Follow up MRSA PCR. Monitor renal function, clinical course, length of therapy. ? ?Height: '6\' 1"'$  (185.4 cm) ?Weight: 82.2 kg (181 lb 3.5 oz) ?IBW/kg (Calculated) : 75.4 ? ?Temp (24hrs), Avg:98.5 ?F (36.9 ?C), Min:98 ?F (36.7 ?C), Max:99 ?F (37.2 ?C) ? ?Recent Labs  ?Lab 01/04/22 ?0422 01/04/22 ?1225 01/05/22 ?0313 01/06/22 ?0431 01/06/22 ?1015 01/07/22 ?6256 01/07/22 ?1327  ?WBC 15.7*  --  16.1* 16.8* 14.9* 20.4* 25.1*  ?CREATININE 4.29* 4.50* 3.65* 2.88*  --  2.11*  --   ?  ?Estimated Creatinine Clearance: 29.5 mL/min (A) (by C-G formula based on SCr of 2.11 mg/dL (H)).   ? ?Allergies  ?Allergen Reactions  ? Penicillins Rash  ? ? ?Antimicrobials this admission: ?0416 Vancomycin >>  ?0416 Cefepime >>  ? ?Dose adjustments this admission: ?N/a ? ?Microbiology results: ?3893 MRSA PCR: in process ? ?Thank you for allowing pharmacy to be a part of this patient?s care. ? ?Wynelle Cleveland, PharmD ?Pharmacy Resident  ?01/07/2022 ?2:38 PM ?

## 2022-01-07 NOTE — Progress Notes (Signed)
Patient's right femoral access site has pigtail attachment which IV team confirmed is good for antibiotics and blood draws. ?

## 2022-01-07 NOTE — Progress Notes (Signed)
Received IV consult for midline placement. Patient currently receiving HD. Right femoral [place] with pigtail in place. Notified primary RN Claiborne Billings that pigtail can be used for lab draws and IVF/IV medicines. Also placed ultrasound guided 20g 1.88" cath for IV antibiotics. ?

## 2022-01-07 NOTE — Final Progress Note (Signed)
Cross covering note ? ?Patient had a rapid response event.  She has severe 10 out of 10 pain on her right flank, hypoxia with oxygen saturation of 89% on room air and tachycardia ? ?Patient seen and examined.  She does seem somewhat anxious.  Has tenderness on her right flank on exam. ? ?We will obtain stat CT chest/abdomen/pelvis, ABG, EKG, CBC, CMP, troponins, chest x-ray ? ?transfer to ICU/stepdown after CT scan for close monitoring ? ?Discussed with ICU team.  Consult intensivist for critical care management ? ? ? ?Updated patient's granddaughter over phone and discussed management plan with nursing, consultants and attending. ? ?Time spent: 35 minutes ? ? ?

## 2022-01-07 NOTE — Progress Notes (Signed)
ANTICOAGULATION CONSULT NOTE ? ?Pharmacy Consult for heparin infusion ?Indication: chest pain/ACS ? ?Allergies  ?Allergen Reactions  ? Penicillins Rash  ? ? ?Patient Measurements: ?Height: '6\' 1"'$  (185.4 cm) ?Weight: 82.2 kg (181 lb 3.5 oz) ?IBW/kg (Calculated) : 75.4 ?Heparin Dosing Weight: 82.2 kg ? ?Vital Signs: ?Temp: 98.9 ?F (37.2 ?C) (04/15 1600) ?Temp Source: Oral (04/15 1600) ?BP: 126/71 (04/15 1600) ?Pulse Rate: 107 (04/15 1600) ? ?Labs: ?Recent Labs  ?  01/06/22 ?0431 01/06/22 ?1015 01/07/22 ?1696 01/07/22 ?1327 01/07/22 ?1521  ?HGB 7.2* 6.5* 8.9* 8.7*  --   ?HCT 23.1* 20.4* 27.4* 27.9*  --   ?PLT 286 276 265 253  --   ?CREATININE 2.88*  --  2.11* 2.34*  --   ?TROPONINIHS  --   --   --  1,329* 1,591*  ? ? ?Estimated Creatinine Clearance: 26.6 mL/min (A) (by C-G formula based on SCr of 2.34 mg/dL (H)). ? ? ?Medical History: ?Past Medical History:  ?Diagnosis Date  ? Coronary artery disease   ? Hypertension   ? ? ?Medications:  ?Per chart review, no anticoagulation PTA. Was on [place] heparin for DVT ppx - last dose 4/15 '@1408'$  ? ?Assessment: ?71 year old female with past medical history of CAD, hypertension and chronic kidney disease stage IV. Pharmacy consulted for heparin dosing/monitoring.  ? ?Goal of Therapy:  ?Heparin level 0.3-0.7 units/ml ?Monitor platelets by anticoagulation protocol: Yes ?  ?Plan:  ?No initial bolus. Will start heparin infusion at 950 units/hr ?Check anti-Xa level in 8 hours and daily while on heparin ?Continue to monitor H&H and platelets ? ?Hercules Hasler O Abbee Cremeens ?01/07/2022,4:48 PM ? ? ?

## 2022-01-07 NOTE — Progress Notes (Signed)
?   01/07/22 1230  ?Vitals  ?BP (!) 209/108  ?MAP (mmHg) 115  ?BP Location Left Arm  ?BP Method Automatic  ?Patient Position (if appropriate) Sitting  ?Pulse Rate (!) 134  ?Pulse Rate Source Monitor  ?Resp (!) 30  ?Level of Consciousness  ?Level of Consciousness Alert  ?MEWS COLOR  ?MEWS Score Color Red  ?Oxygen Therapy  ?SpO2 (!) 79 %  ?O2 Device Room Air  ?MEWS Score  ?MEWS Temp 0  ?MEWS Systolic 2  ?MEWS Pulse 3  ?MEWS RR 2  ?MEWS LOC 0  ?MEWS Score 7  ?Provider Notification  ?Provider Name/Title gherghe  ?Date Provider Notified 01/07/22  ?Time Provider Notified 1230  ?Method of Notification  ?(secure message)  ?Notification Reason Change in status ?(RRT)  ?Provider response Evaluate remotely;See new orders;Other (Comment) ?(page Dr. Manuella Ghazi)  ?Date of Provider Response 01/07/22  ?Time of Provider Response 1230  ?Rapid Response Notification  ?Name of Rapid Response RN Notified [place] Nails  ?Date Rapid Response Notified 01/07/22  ?Time Rapid Response Notified 1230  ?Note  ?Observations patient complaint of nausea, flank pain, SOB anxious  ? ? ?

## 2022-01-07 NOTE — Progress Notes (Signed)
?   01/07/22 1230  ?Assess: MEWS Score  ?BP (!) 209/108  ?Pulse Rate (!) 134  ?Resp (!) 30  ?Level of Consciousness Alert  ?SpO2 (!) 79 %  ?O2 Device Room Air  ?Assess: MEWS Score  ?MEWS Temp 0  ?MEWS Systolic 2  ?MEWS Pulse 3  ?MEWS RR 2  ?MEWS LOC 0  ?MEWS Score 7  ?MEWS Score Color Red  ?Assess: if the MEWS score is Yellow or Red  ?Were vital signs taken at a resting state? Yes  ?Focused Assessment Change from prior assessment (see assessment flowsheet)  ?Does the patient meet 2 or more of the SIRS criteria? Yes  ?Early Detection of Sepsis Score *See Row Information* High  ?MEWS guidelines implemented *See Row Information* Yes  ?Treat  ?MEWS Interventions Administered prn meds/treatments  ?Pain Scale 0-10  ?Pain Score 10  ?Pain Type Acute pain  ?Pain Location Abdomen ?(Right Flank)  ?Pain Orientation Right  ?Pain Onset Sudden  ?Complains of Nausea /  Vomiting;Shortness of breath;Restless  ?Interventions Medication (see MAR)  ?Nausea relieved by Antiemetic  ?Patients response to intervention Decreased  ?Take Vital Signs  ?Increase Vital Sign Frequency  Red: Q 1hr X 4 then Q 4hr X 4, if remains red, continue Q 4hrs  ?Escalate  ?MEWS: Escalate Red: discuss with charge nurse/RN and provider, consider discussing with RRT  ?Notify: Charge Nurse/RN  ?Name of Charge Nurse/RN Notified Shirlean Mylar  ?Date Charge Nurse/RN Notified 01/07/22  ?Time Charge Nurse/RN Notified 1230  ?Notify: Provider  ?Provider Name/Title Gherghe  ?Date Provider Notified 01/07/22  ?Time Provider Notified 1230  ?Notification Type  ?(secure chat)  ?Notification Reason Change in status  ?Provider response Evaluate remotely;See new orders;Other (Comment) ?(page Dr. Manuella Ghazi)  ?Date of Provider Response 01/07/22  ?Time of Provider Response 1230  ?Notify: Rapid Response  ?Name of Rapid Response RN Notified Purcell Nails  ?Date Rapid Response Notified 01/07/22  ?Time Rapid Response Notified 1230  ?Document  ?Patient Outcome Transferred/level of care increased   ?Progress note created (see row info) Yes  ?Assess: SIRS CRITERIA  ?SIRS Temperature  0  ?SIRS Pulse 1  ?SIRS Respirations  1  ?SIRS WBC 1  ?SIRS Score Sum  3  ? ? ?

## 2022-01-07 NOTE — Consult Note (Signed)
[facility] Cardiology ? ?CARDIOLOGY CONSULT NOTE  ?Patient ID: ?Deanna [name] ?MRN: [id number] ?DOB/AGE: [birth year] 71 y.o. ? ?Admit date: 12/26/2021 ?Referring Physician Costin Ghergehe ?Primary Physician [facility], [place] ?Primary Cardiologist Baxter Hire, MD ?Reason for Consultation Elevated troponin ? ?HPI:  ?Deanna [name] is a 71 year old female with CKD 5, HFrEF with EF 30%), CAD (CTO RCA, moderate disease elsewhere by cath 12/15/2021), type 2 diabetes, hypertension, hyperlipidemia who was admitted to the hospital with progressive renal failure.  Cardiology is consulted for an elevated troponin in the setting of severe right-sided flank pain, and elevated white blood cell count possibly due to infectious source. ? ?She was initially admitted to the hospital on 12/26/21 with right-sided numbness and weakness for which the etiology is not completely clear but is being treated empirically for stroke.  During which time she developed hyperkalemia and was dialyzed on 4/12, 4/13, and 4/14, though now showing signs of renal recovery.  On 01/07/2022 she developed severe right flank pain rated 10 out of 10 in severity, and developed hypoxia with an oxygen saturation of 89% on room air as well as tachycardia.  Following this the EKG showed sinus tachycardia with lateral ST depressions and her troponin was checked and discovered to be 1300.  Notably her white blood cell count is 25,000, and she recently received 2 units of packed red blood cells on 01/06/2022 for hemoglobin of 6.5 with no overt bleeding.  She had a CT of her abdomen and pelvis which showed pulmonary edema in her lower lung bases. ? ?At the time of my evaluation she comfortable and denies any chest pain or shortness of breath, though she is on supplemental oxygen. She says that her pain in her RUQ started after a RUQ ultrasound.  ? ?Review of systems complete and found to be negative unless listed above  ? ? ? ?Past Medical History:  ?Diagnosis Date   ? Coronary artery disease   ? Hypertension   ?  ?Past Surgical History:  ?Procedure Laterality Date  ? CARDIAC CATHETERIZATION Left 03/01/2016  ? Procedure: Left Heart Cath and Coronary Angiography;  Surgeon: Teodoro Spray, MD;  Location: [facility];  Service: Cardiovascular;  Laterality: Left;  ? TEMPORARY DIALYSIS CATHETER N/A 01/04/2022  ? Procedure: TEMPORARY DIALYSIS CATHETER;  Surgeon: Algernon Huxley, MD;  Location: [facility];  Service: Cardiovascular;  Laterality: N/A;  ?  ?Medications Prior to Admission  ?Medication Sig Dispense Refill Last Dose  ? aspirin EC 81 MG tablet Take 81 mg by mouth daily.   12/25/2021  ? atorvastatin (LIPITOR) 40 MG tablet Take 40 mg by mouth daily.   12/24/2021  ? gabapentin (NEURONTIN) 100 MG capsule Take 300 mg by mouth.   12/24/2021  ? insulin glargine (LANTUS) 100 UNIT/ML injection Inject 65 Units into the skin at bedtime.   12/24/2021 at 2300  ? lisinopril-hydrochlorothiazide (PRINZIDE,ZESTORETIC) 20-12.5 MG tablet Take 1 tablet by mouth daily.   12/24/2021  ? carbamide peroxide (DEBROX) 6.5 % OTIC solution Place 5 drops into the right ear 2 (two) times daily. (Patient not taking: Reported on 12/26/2021) 15 mL 1 Not Taking  ? fluticasone (FLONASE) 50 MCG/ACT nasal spray Place 1 spray into both nostrils 2 (two) times daily. (Patient not taking: Reported on 12/26/2021) 16 g 0 Not Taking  ? insulin aspart protamine- aspart (NOVOLOG MIX 70/30) (70-30) 100 UNIT/ML injection Inject into the skin. (Patient not taking: Reported on 12/26/2021)   Not Taking  ? metFORMIN (GLUCOPHAGE) 500  MG tablet Take 500 mg by mouth 2 (two) times daily with a meal. (Patient not taking: Reported on 12/26/2021)   Not Taking  ? metoprolol succinate (TOPROL-XL) 100 MG 24 hr tablet Take 100 mg by mouth daily. Take with or immediately following a meal. (Patient not taking: Reported on 12/26/2021)   Not Taking  ? ?Social History  ? ?Socioeconomic History  ? Marital status: Single  ?  Spouse name: Not on file   ? Number of children: Not on file  ? Years of education: Not on file  ? Highest education level: Not on file  ?Occupational History  ? Not on file  ?Tobacco Use  ? Smoking status: Never  ? Smokeless tobacco: Not on file  ?Substance and Sexual Activity  ? Alcohol use: No  ? Drug use: No  ? Sexual activity: Not on file  ?Other Topics Concern  ? Not on file  ?Social History Narrative  ? Not on file  ? ?Social Determinants of Health  ? ?Financial Resource Strain: Not on file  ?Food Insecurity: Not on file  ?Transportation Needs: Not on file  ?Physical Activity: Not on file  ?Stress: Not on file  ?Social Connections: Not on file  ?Intimate Partner Violence: Not on file  ?  ?History reviewed. No pertinent family history.  ? ? ?Review of systems complete and found to be negative unless listed above  ? ? ? ? ?PHYSICAL EXAM ? ?General: Well developed, well nourished, in no acute distress ?HEENT:  Normocephalic and atramatic ?Neck:  No JVD.  ?Lungs: Clear bilaterally to auscultation and percussion. ?Heart: HRRR . Normal S1 and S2 without gallops or murmurs.  ?Abdomen: Bowel sounds are positive, abdomen soft and non-tender  ?Msk:  Back normal, normal gait. Normal strength and tone for age. ?Extremities: No clubbing, cyanosis or edema.   ?Neuro: Alert and oriented X 3. ?Psych:  Good affect, responds appropriately ? ?Labs: ?  ?Lab Results  ?Component Value Date  ? WBC 25.1 (H) 01/07/2022  ? HGB 8.7 (L) 01/07/2022  ? HCT 27.9 (L) 01/07/2022  ? MCV 86.1 01/07/2022  ? PLT 253 01/07/2022  ?  ?Recent Labs  ?Lab 01/07/22 ?1327  ?NA 136  ?K 5.3*  ?CL 99  ?CO2 27  ?BUN 48*  ?CREATININE 2.34*  ?CALCIUM 8.1*  ?PROT 6.5  ?BILITOT 0.7  ?ALKPHOS 227*  ?ALT 192*  ?AST 155*  ?GLUCOSE 243*  ? ?Lab Results  ?Component Value Date  ? TROPONINI <0.03 12/09/2017  ?  ?Lab Results  ?Component Value Date  ? CHOL 182 12/27/2021  ? ?Lab Results  ?Component Value Date  ? HDL 80 12/27/2021  ? ?Lab Results  ?Component Value Date  ? Gem 87 12/27/2021   ? ?Lab Results  ?Component Value Date  ? TRIG 77 12/27/2021  ? ?Lab Results  ?Component Value Date  ? CHOLHDL 2.3 12/27/2021  ? ?No results found for: LDLDIRECT  ?  ?Radiology: CT HEAD WO CONTRAST ? ?Result Date: 12/26/2021 ?CLINICAL DATA:  Intermittent numbness, weakness, and dizziness. Unable to lift right arm. EXAM: CT HEAD WITHOUT CONTRAST TECHNIQUE: Contiguous axial images were obtained from the base of the skull through the vertex without intravenous contrast. RADIATION DOSE REDUCTION: This exam was performed according to the departmental dose-optimization program which includes automated exposure control, adjustment of the mA and/or kV according to patient size and/or use of iterative reconstruction technique. COMPARISON:  None. FINDINGS: Brain: No evidence of acute infarction, hemorrhage, hydrocephalus, extra-axial collection or mass lesion/mass  effect. Cerebral volume within normal limits for age. Scattered mild periventricular and subcortical white matter hypodensities are nonspecific, but favored to reflect chronic microvascular ischemic changes. Vascular: Atherosclerotic vascular calcification of the carotid siphons. No hyperdense vessel. Skull: Normal. Negative for fracture or focal lesion. Sinuses/Orbits: No acute finding. Scattered mild paranasal sinus mucosal thickening. The orbits are unremarkable. Other: None. IMPRESSION: 1. No acute intracranial abnormality. 2. Mild chronic microvascular ischemic changes. Electronically Signed   By: Titus Dubin M.D.   On: 12/26/2021 16:52  ? ?MR ANGIO HEAD WO CONTRAST ? ?Result Date: 12/28/2021 ?CLINICAL DATA:  Stroke follow-up EXAM: MRA HEAD WITHOUT CONTRAST TECHNIQUE: Angiographic images of the Circle of Willis were acquired using MRA technique without intravenous contrast. COMPARISON:  No pertinent prior exam. FINDINGS: POSTERIOR CIRCULATION: --Vertebral arteries: Normal --Inferior cerebellar arteries: Normal. --Basilar artery: Normal. --Superior cerebellar  arteries: Normal. --Posterior cerebral arteries: Normal. ANTERIOR CIRCULATION: --Intracranial internal carotid arteries: Normal. --Anterior cerebral arteries (ACA): Normal. --Middle cerebral arteries (MCA

## 2022-01-07 NOTE — Progress Notes (Signed)
?[facility]  ?ROUNDING NOTE  ? ?Subjective:  ? ?Patient seen and evaluated at bedside today. ?Feels a little frustrated today as she does not fully understand what is going on with her.  Patient and nurse report that she is voiding more often. ?Serum creatinine has improved today. ? ? ? ?Objective:  ?Vital signs in last 24 hours:  ?Temp:  [98 ?F (36.7 ?C)-98.7 ?F (37.1 ?C)] 98 ?F (36.7 ?C) (04/15 0504) ?Pulse Rate:  [86-107] 107 (04/15 0504) ?Resp:  [14-24] 14 (04/15 0504) ?BP: (131-153)/(64-75) 153/72 (04/15 0504) ?SpO2:  [89 %-100 %] 89 % (04/15 0504) ?Weight:  [82.2 kg] 82.2 kg (04/14 1557) ? ?Weight change: 0.6 kg ?Filed Weights  ? 01/05/22 1012 01/05/22 1327 01/06/22 1557  ?Weight: 81.6 kg 81.3 kg 82.2 kg  ? ? ?Intake/Output: ?I/O last 3 completed shifts: ?In: 5681 [P.O.:717; Blood:450; NG/GT:237] ?Out: 500 [Other:500] ?  ?Intake/Output this shift: ? No intake/output data recorded. ? ?Physical Exam: ?General: NAD, laying in bed  ?Head: Normocephalic, atraumatic. Moist oral mucosal membranes  ?Eyes: Anicteric  ?Lungs:  Clear to auscultation, normal effort  ?Heart: Regular rate and rhythm  ?Abdomen:  Soft, nontender  ?Extremities: No peripheral edema.  ?Neurologic: Nonfocal, moving all four extremities  ?Skin: No lesions  ?Access: Right femoral nontunneled hemodialysis catheter  ? ? ?Basic Metabolic Panel: ?Recent Labs  ?Lab 01/03/22 ?2751 01/04/22 ?0422 01/04/22 ?1225 01/04/22 ?1647 01/05/22 ?7001 01/06/22 ?7494 01/07/22 ?4967  ?NA 129* 126*  --   --  131* 135 137  ?K 6.1* 6.7*  --  5.5* 5.5* 5.1 4.9  ?CL 98 96*  --   --  99 101 99  ?CO2 21* 20*  --   --  '23 25 29  '$ ?GLUCOSE 454* 419*  --   --  251* 142* 59*  ?BUN 82* 90* 101*  --  80* 68* 42*  ?CREATININE 3.87* 4.29* 4.50*  --  3.65* 2.88* 2.11*  ?CALCIUM 8.4* 8.3*  --   --  8.1* 8.3* 8.5*  ?MG  --   --   --   --  1.6* 1.7  --   ?PHOS  --   --   --   --  5.0* 5.3*  5.3*  --   ? ? ? ?Liver Function Tests: ?Recent Labs  ?Lab 01/05/22 ?0313  01/06/22 ?0431 01/07/22 ?5916  ?AST 94*  --  99*  ?ALT 152*  --  174*  ?ALKPHOS 152*  --  178*  ?BILITOT 0.5  --  0.5  ?PROT 6.2*  --  6.3*  ?ALBUMIN 2.3* 2.4* 2.5*  ? ? ?No results for input(s): LIPASE, AMYLASE in the last 168 hours. ?No results for input(s): AMMONIA in the last 168 hours. ? ?CBC: ?Recent Labs  ?Lab 01/04/22 ?0422 01/05/22 ?0313 01/06/22 ?0431 01/06/22 ?1015 01/07/22 ?3846  ?WBC 15.7* 16.1* 16.8* 14.9* 20.4*  ?HGB 8.1* 7.6* 7.2* 6.5* 8.9*  ?HCT 26.3* 24.8* 23.1* 20.4* 27.4*  ?MCV 84.6 84.4 85.6 84.6 83.3  ?PLT 338 330 286 276 265  ? ? ? ?Cardiac Enzymes: ?No results for input(s): CKTOTAL, CKMB, CKMBINDEX, TROPONINI in the last 168 hours. ? ?BNP: ?Invalid input(s): POCBNP ? ?CBG: ?Recent Labs  ?Lab 01/05/22 ?2129 01/06/22 ?0749 01/06/22 ?1200 01/06/22 ?2023 01/07/22 ?0920  ?GLUCAP 112* 187* 169* 126* 66*  ? ? ? ?Microbiology: ?Results for orders placed or performed during the hospital encounter of 12/26/21  ?Respiratory (~20 pathogens) panel by PCR     Status: Abnormal  ? Collection Time:  12/27/21 11:32 AM  ? Specimen: Nasopharyngeal Swab; Respiratory  ?Result Value Ref Range Status  ? Adenovirus NOT DETECTED NOT DETECTED Final  ? Coronavirus 229E NOT DETECTED NOT DETECTED Final  ?  Comment: (NOTE) ?The Coronavirus on the Respiratory Panel, DOES NOT test for the novel  ?Coronavirus (2019 nCoV) ?  ? Coronavirus HKU1 NOT DETECTED NOT DETECTED Final  ? Coronavirus NL63 NOT DETECTED NOT DETECTED Final  ? Coronavirus OC43 NOT DETECTED NOT DETECTED Final  ? Metapneumovirus NOT DETECTED NOT DETECTED Final  ? Rhinovirus / Enterovirus NOT DETECTED NOT DETECTED Final  ? Influenza A NOT DETECTED NOT DETECTED Final  ? Influenza B NOT DETECTED NOT DETECTED Final  ? Parainfluenza Virus 1 NOT DETECTED NOT DETECTED Final  ? Parainfluenza Virus 2 NOT DETECTED NOT DETECTED Final  ? Parainfluenza Virus 3 DETECTED (A) NOT DETECTED Final  ? Parainfluenza Virus 4 NOT DETECTED NOT DETECTED Final  ? Respiratory Syncytial  Virus NOT DETECTED NOT DETECTED Final  ? Bordetella pertussis NOT DETECTED NOT DETECTED Final  ? Bordetella Parapertussis NOT DETECTED NOT DETECTED Final  ? Chlamydophila pneumoniae NOT DETECTED NOT DETECTED Final  ? Mycoplasma pneumoniae NOT DETECTED NOT DETECTED Final  ?  Comment: Performed at [facility], [place] [address], [place], [place] [address]  ?Resp Panel by RT-PCR (Flu A&B, Covid) Nasopharyngeal Swab     Status: None  ? Collection Time: 12/27/21 11:32 AM  ? Specimen: Nasopharyngeal Swab; Nasopharyngeal(NP) swabs in vial transport medium  ?Result Value Ref Range Status  ? SARS Coronavirus 2 by RT PCR NEGATIVE NEGATIVE Final  ?  Comment: (NOTE) ?SARS-CoV-2 target nucleic acids are NOT DETECTED. ? ?The SARS-CoV-2 RNA is generally detectable in upper respiratory ?specimens during the acute phase of infection. The lowest ?concentration of SARS-CoV-2 viral copies this assay can detect is ?138 copies/mL. A negative result does not preclude SARS-Cov-2 ?infection and should not be used as the sole basis for treatment or ?other patient management decisions. A negative result may occur with  ?improper specimen collection/handling, submission of specimen other ?than nasopharyngeal swab, presence of viral mutation(s) within the ?areas targeted by this assay, and inadequate number of viral ?copies(<138 copies/mL). A negative result must be combined with ?clinical observations, patient history, and epidemiological ?information. The expected result is Negative. ? ?Fact Sheet for Patients:  ?EntrepreneurPulse.com.au ? ?Fact Sheet for Healthcare Providers:  ?IncredibleEmployment.be ? ?This test is no t yet approved or cleared by the [place] FDA and  ?has been authorized for detection and/or diagnosis of SARS-CoV-2 by ?FDA under an Emergency Use Authorization (EUA). This EUA will remain  ?in effect (meaning this test can be used) for the duration of the ?COVID-19 declaration  under Section 564(b)(1) of the Act, 21 ?U.S.C.section 360bbb-3(b)(1), unless the authorization is terminated  ?or revoked sooner.  ? ? ?  ? Influenza A by PCR NEGATIVE NEGATIVE Final  ? Influenza B by PCR NEGATIVE NEGATIVE Final  ?  Comment: (NOTE) ?The Xpert Xpress SARS-CoV-2/FLU/RSV plus assay is intended as an aid ?in the diagnosis of influenza from Nasopharyngeal swab specimens and ?should not be used as a sole basis for treatment. Nasal washings and ?aspirates are unacceptable for Xpert Xpress SARS-CoV-2/FLU/RSV ?testing. ? ?Fact Sheet for Patients: ?EntrepreneurPulse.com.au ? ?Fact Sheet for Healthcare Providers: ?IncredibleEmployment.be ? ?This test is not yet approved or cleared by the [place] FDA and ?has been authorized for detection and/or diagnosis of SARS-CoV-2 by ?FDA under an Emergency Use Authorization (EUA). This EUA will remain ?in  effect (meaning this test can be used) for the duration of the ?COVID-19 declaration under Section 564(b)(1) of the Act, 21 U.S.C. ?section 360bbb-3(b)(1), unless the authorization is terminated or ?revoked. ? ?Performed at [facility], [place], ?[place] [address] ?  ? ? ?Coagulation Studies: ?No results for input(s): LABPROT, INR in the last 72 hours. ? ?Urinalysis: ?No results for input(s): COLORURINE, LABSPEC, [place], GLUCOSEU, HGBUR, BILIRUBINUR, KETONESUR, PROTEINUR, UROBILINOGEN, NITRITE, LEUKOCYTESUR in the last 72 hours. ? ?Invalid input(s): APPERANCEUR  ? ? ?Imaging: ?US Abdomen Limited RUQ (LIVER/GB) ? ?Result Date: 01/07/2022 ?CLINICAL DATA:  Elevated liver function test. EXAM: ULTRASOUND ABDOMEN LIMITED RIGHT UPPER QUADRANT COMPARISON:  None. FINDINGS: Gallbladder: 1 cm gallstone in the lower gallbladder segment/neck. No gallbladder wall thickening or pericholecystic fluid. No sonographic Murphy's sign. Common bile duct: Diameter: 3 mm Liver: No focal lesion identified. Within normal limits  in parenchymal echogenicity. Portal vein is patent on color Doppler imaging with normal direction of blood flow towards the liver. Other: Right pleural effusion. IMPRESSION: 1. No acute findings.  No evidence of acu

## 2022-01-07 NOTE — Progress Notes (Signed)
OT Cancellation Note ? ?Patient Details ?Name: Deanna [name] ?MRN: [id number] ?DOB: Feb 13, 1951 ? ? ?Cancelled Treatment:    Reason Eval/Treat Not Completed: Other (comment) (pt with rapid response this afternoon, now in CCU. Will hold OT on this date, reattempt as approrpiate.) ? ?Shanon Payor, OTD OTR/L  ?01/07/22, 3:24 PM  ?

## 2022-01-08 ENCOUNTER — Inpatient Hospital Stay: Payer: Medicare HMO

## 2022-01-08 DIAGNOSIS — I639 Cerebral infarction, unspecified: Secondary | ICD-10-CM | POA: Diagnosis not present

## 2022-01-08 DIAGNOSIS — R531 Weakness: Secondary | ICD-10-CM | POA: Diagnosis not present

## 2022-01-08 DIAGNOSIS — N179 Acute kidney failure, unspecified: Secondary | ICD-10-CM | POA: Diagnosis not present

## 2022-01-08 DIAGNOSIS — R778 Other specified abnormalities of plasma proteins: Secondary | ICD-10-CM

## 2022-01-08 DIAGNOSIS — E875 Hyperkalemia: Secondary | ICD-10-CM

## 2022-01-08 DIAGNOSIS — J181 Lobar pneumonia, unspecified organism: Secondary | ICD-10-CM | POA: Diagnosis not present

## 2022-01-08 LAB — COMPREHENSIVE METABOLIC PANEL WITH GFR
ALT: 192 U/L — ABNORMAL HIGH (ref 0–44)
AST: 131 U/L — ABNORMAL HIGH (ref 15–41)
Albumin: 2.3 g/dL — ABNORMAL LOW (ref 3.5–5.0)
Alkaline Phosphatase: 218 U/L — ABNORMAL HIGH (ref 38–126)
Anion gap: 9 (ref 5–15)
BUN: 62 mg/dL — ABNORMAL HIGH (ref 8–23)
CO2: 29 mmol/L (ref 22–32)
Calcium: 8.2 mg/dL — ABNORMAL LOW (ref 8.9–10.3)
Chloride: 98 mmol/L (ref 98–111)
Creatinine, Ser: 2.95 mg/dL — ABNORMAL HIGH (ref 0.44–1.00)
GFR, Estimated: 17 mL/min — ABNORMAL LOW
Glucose, Bld: 172 mg/dL — ABNORMAL HIGH (ref 70–99)
Potassium: 5.7 mmol/L — ABNORMAL HIGH (ref 3.5–5.1)
Sodium: 136 mmol/L (ref 135–145)
Total Bilirubin: 0.4 mg/dL (ref 0.3–1.2)
Total Protein: 6.2 g/dL — ABNORMAL LOW (ref 6.5–8.1)

## 2022-01-08 LAB — BLOOD GAS, ARTERIAL
Acid-Base Excess: 1 mmol/L (ref 0.0–2.0)
Bicarbonate: 27.1 mmol/L (ref 20.0–28.0)
O2 Content: 4 L/min
O2 Saturation: 84.6 %
Patient temperature: 37
pCO2 arterial: 49 mmHg — ABNORMAL HIGH (ref 32–48)
pH, Arterial: 7.35 (ref 7.35–7.45)
pO2, Arterial: 54 mmHg — ABNORMAL LOW (ref 83–108)

## 2022-01-08 LAB — CBC
HCT: 24.9 % — ABNORMAL LOW (ref 36.0–46.0)
Hemoglobin: 7.8 g/dL — ABNORMAL LOW (ref 12.0–15.0)
MCH: 26.8 pg (ref 26.0–34.0)
MCHC: 31.3 g/dL (ref 30.0–36.0)
MCV: 85.6 fL (ref 80.0–100.0)
Platelets: 235 10*3/uL (ref 150–400)
RBC: 2.91 MIL/uL — ABNORMAL LOW (ref 3.87–5.11)
RDW: 15.2 % (ref 11.5–15.5)
WBC: 15 10*3/uL — ABNORMAL HIGH (ref 4.0–10.5)
nRBC: 0 % (ref 0.0–0.2)

## 2022-01-08 LAB — GLUCOSE, CAPILLARY
Glucose-Capillary: 160 mg/dL — ABNORMAL HIGH (ref 70–99)
Glucose-Capillary: 120 mg/dL — ABNORMAL HIGH (ref 70–99)
Glucose-Capillary: 231 mg/dL — ABNORMAL HIGH (ref 70–99)
Glucose-Capillary: 106 mg/dL — ABNORMAL HIGH (ref 70–99)

## 2022-01-08 LAB — TROPONIN I (HIGH SENSITIVITY)
Troponin I (High Sensitivity): 2265 ng/L
Troponin I (High Sensitivity): 1960 ng/L (ref <18)
Troponin I (High Sensitivity): 2044 ng/L (ref <18)

## 2022-01-08 LAB — C-REACTIVE PROTEIN: CRP: 9.1 mg/dL — ABNORMAL HIGH (ref <1.0)

## 2022-01-08 LAB — MAGNESIUM: Magnesium: 1.7 mg/dL (ref 1.7–2.4)

## 2022-01-08 LAB — PHOSPHORUS: Phosphorus: 6.1 mg/dL — ABNORMAL HIGH (ref 2.5–4.6)

## 2022-01-08 LAB — HEPARIN LEVEL (UNFRACTIONATED)
Heparin Unfractionated: 0.53 IU/mL (ref 0.30–0.70)
Heparin Unfractionated: 0.51 IU/mL (ref 0.30–0.70)

## 2022-01-08 MED ORDER — METOPROLOL SUCCINATE ER 50 MG PO TB24
50.0000 mg | ORAL_TABLET | Freq: Every day | ORAL | Status: DC
Start: 2022-01-08 — End: 2022-01-09
  Administered 2022-01-08: 50 mg via ORAL
  Filled 2022-01-08 (×2): qty 1

## 2022-01-08 MED ORDER — SODIUM ZIRCONIUM CYCLOSILICATE 10 G PO PACK
10.0000 g | PACK | Freq: Every day | ORAL | Status: DC
  Administered 2022-01-08 – 2022-01-12 (×4): 10 g via ORAL
  Filled 2022-01-08: qty 1
  Filled 2022-01-08: qty 2
  Filled 2022-01-08 (×4): qty 1

## 2022-01-08 MED ORDER — PATIROMER SORBITEX CALCIUM 8.4 G PO PACK
16.8000 g | PACK | Freq: Once | ORAL | Status: AC
  Administered 2022-01-08: 16.8 g via ORAL
  Filled 2022-01-08: qty 2

## 2022-01-08 MED ORDER — HEPARIN SODIUM (PORCINE) 1000 UNIT/ML IJ SOLN
INTRAMUSCULAR | Status: AC
  Filled 2022-01-08: qty 10

## 2022-01-08 NOTE — Procedures (Signed)
Pt was seen at bedside for diagnostic and therapeutic thoracentesis. Upon US examination, there was no fluid on L and a very small fluid pocket on R that was not large enough to safely access.  ?Exam was ended and explained to patient.  ?Patient was in agreement with findings.  ?Care team notified of findings.  ? ? ? ?Narda Rutherford, AGNP-BC ?01/08/2022, 11:37 AM ? ? ? ?

## 2022-01-08 NOTE — Progress Notes (Signed)
ANTICOAGULATION CONSULT NOTE ? ?Pharmacy Consult for heparin infusion ?Indication: chest pain/ACS ? ?Allergies  ?Allergen Reactions  ? Penicillins Rash  ? ? ?Patient Measurements: ?Height: '6\' 1"'$  (185.4 cm) ?Weight: 82.2 kg (181 lb 3.5 oz) ?IBW/kg (Calculated) : 75.4 ?Heparin Dosing Weight: 82.2 kg ? ?Vital Signs: ?Temp: 98.1 ?F (36.7 ?C) (04/16 0800) ?Temp Source: Oral (04/16 0800) ?BP: 134/80 (04/16 0800) ?Pulse Rate: 97 (04/16 0800) ? ?Labs: ?Recent Labs  ?  01/07/22 ?0651 01/07/22 ?1327 01/07/22 ?1521 01/07/22 ?1700 01/08/22 ?0156 01/08/22 ?0159 01/08/22 ?0931  ?HGB 8.9* 8.7*  --   --   --  7.8*  --   ?HCT 27.4* 27.9*  --   --   --  24.9*  --   ?PLT 265 253  --   --   --  235  --   ?LABPROT  --   --   --  13.3  --   --   --   ?INR  --   --   --  1.0  --   --   --   ?HEPARINUNFRC  --   --   --   --  0.53  --  0.51  ?CREATININE 2.11* 2.34*  --   --   --  2.95*  --   ?TROPONINIHS  --  1,329* 1,591*  --   --   --  2,265*  ? ? ? ?Estimated Creatinine Clearance: 21.1 mL/min (A) (by C-G formula based on SCr of 2.95 mg/dL (H)). ? ? ?Medical History: ?Past Medical History:  ?Diagnosis Date  ? Coronary artery disease   ? Hypertension   ? ? ?Medications:  ?Per chart review, no anticoagulation PTA. Was on [place] heparin for DVT ppx - last dose 4/15 '@1408'$  ? ?Assessment: ?71 year old female with past medical history of CAD, hypertension and chronic kidney disease stage IV. Pharmacy consulted for heparin dosing/monitoring.  ? ?Goal of Therapy:  ?Heparin level 0.3-0.7 units/ml ?Monitor platelets by anticoagulation protocol: Yes ? ?Results: ?Date/time Level  Comment ?4/16 0156 HL 0.53 Therapeutic x 1 , infusion @ 950un/hr ?4/16 0931 HL 0.51 Therapeutic x 2 ?  ?Plan:  ?Heparin level therapeutic x 2. Will continue heparin infusion at 950 units/hr ?Check anti-Xa level with AM labs ?Continue to monitor H&H and platelets ? ?Pearla Dubonnet, PharmD ?Clinical Pharmacist ?01/08/2022 ?10:16 AM ? ? ? ?

## 2022-01-08 NOTE — Progress Notes (Signed)
Patient is to have a bedside ultrasound and thoracentesis, NP confirmed to not stop heparin before procedure, continue with heparin gtt. ?

## 2022-01-08 NOTE — Plan of Care (Signed)
Continuing with plan of care. 

## 2022-01-08 NOTE — Assessment & Plan Note (Signed)
Slowly improving.  No clear etiology ?

## 2022-01-08 NOTE — Progress Notes (Signed)
?Progress Note ? ? ?PatientYosselyn [name] LEX:[id number] DOB: 02/16/1951 DOA: 12/26/2021     11 ?DOS: the patient was seen and examined on 01/08/2022 ?  ?Brief hospital course: ?71 year old female with past medical history of CAD, hypertension and chronic kidney disease stage IV presented with right-sided numbness and weakness. ? ?MRI of the brain shows a punctate focus of reduced diffusion within the right superior pericallosal white matter. MRI of the cervical and thoracic spine showed mild spinal canal stenosis and moderate right neuroforaminal stenosis at C6-7, severe right C5-6 neuroforaminal stenosis. ? ?4/15-patient had a rapid response yesterday afternoon for acute onset of right flank pain.  Work-up showed pulmonary edema, lateral ST depression in the EKG and elevated troponin thought to be due to demand ischemia.  Patient was transferred to stepdown for close monitoring ? ?4/16: Heparin for 48 hours per cardiology conservative management.  To be stopped on 4/17.  Lokelma given for hyperkalemia today.  Nephro, cardio, PCCM following ? ? ?Assessment and Plan: ?* Right sided weakness ?Right-sided weakness not explained by MRI of the brain.  Neurology ordered MRI of the cervical spine and thoracic spine and an MRI of the brain. ? ? ?Acute CVA (cerebrovascular accident) ([place]) ?Continue aspirin Plavix and Lipitor.  LDL 87 ? ?Lobar pneumonia ([place]) ?On cefepime.  Respiratory panel positive for parainfluenza virus ? ?Acute kidney injury superimposed on chronic kidney disease ([place]) ?Acute kidney injury on chronic kidney disease stage IV.  Baseline creatinine around 3.19 as per care everywhere. ?Lab Results  ?Component Value Date  ? CREATININE 2.95 (H) 01/08/2022  ? CREATININE 2.34 (H) 01/07/2022  ? CREATININE 2.11 (H) 01/07/2022  ?Dialysis per nephrology ? ?Hyperkalemia ?Nephro is given Lokelma.  Recheck potassium in the morning ? ?Elevated troponin ?Due to demand ischemia.  EKG yesterday showed lateral ST  depression.  Cardiology seen and recommends heparin for 48 hours.  To be stopped on 4/17 ? ?Weakness of right lower extremity ?Slowly improving.  No clear etiology ? ?Acute right flank pain ?Yesterday evening of no clear etiology.  Her flank pain is resolved now.  CT abdomen pelvis did not show any acute pathology ? ?Anemia ?Hemoglobin dropped to 7.8.  No obvious bleeding.  Neupogen per nephrology.  Close monitoring and transfuse as needed ? ? ?Hypokalemia ?Potassium 3.2 on presentation.  Careful with replacement secondary to CKD stage IV. ? ?Type 2 diabetes mellitus without complications ([place]) ? Continue insulin Semglee.  NovoLog and sliding scale insulin.   ? ? ?HFrEF (heart failure with reduced ejection fraction) ([place]) ?Patient is euvolemic at this point.  With acute kidney injury holding diuretics.  Repeating echo with stroke. Last EF last year 30%. ? ?Essential (primary) hypertension ?Restart Toprol low-dose at night. ? ? ? ? ?  ? ?Subjective: She is feeling much better.  Denies any flank pain ? ?Physical Exam: ?Vitals:  ? 01/08/22 1000 01/08/22 1100 01/08/22 1200 01/08/22 1300  ?BP:    (!) 127/59  ?Pulse: (!) 108 (!) 105 (!) 109 (!) 108  ?Resp: (!) 21 (!) '21 19 20  '$ ?Temp:   99.3 ?F (37.4 ?C)   ?TempSrc:      ?SpO2: 95% 95% 92% 93%  ?Weight:      ?Height:      ? ?71 year old female lying in the bed comfortably without any acute distress ?Lungs clear to auscultation by laterally no wheezing rales rhonchi crepitation ?Cardiovascular S1-S2 normal, no murmur or gallop ?Abdomen soft, benign ?Neuro alert and oriented ?Skin no rash or lesion ? ?  Data Reviewed: ? ?Potassium 5.7, creatinine 2.95 ? ?Family Communication: Granddaughter was updated over phone yesterday by myself ? ?Disposition: ?Status is: Inpatient ?Remains inpatient appropriate because: Transfer to floor.  Ongoing management of kidney failure and elevated troponin ? ? Planned Discharge Destination: Home with Home Health ? ? ? DVT prophylaxis- on heparin  drip ?Time spent: 35 minutes ? ?Author: ?Max Sane, MD ?01/08/2022 2:29 PM ? ?For on call review [email or web address].  ?

## 2022-01-08 NOTE — Consult Note (Signed)
[facility] Cardiology ? ?CARDIOLOGY CONSULT NOTE  ?Patient ID: ?Deanna [name] ?MRN: [id number] ?DOB/AGE: 1950/10/29 71 y.o. ? ?Admit date: 12/26/2021 ?Referring Physician Costin Ghergehe ?Primary Physician Center, [place] ?Primary Cardiologist Baxter Hire, MD ?Reason for Consultation Elevated troponin ? ?HPI:  ?Deanna [name] is a 71 year old female with CKD 5, HFrEF with EF 30%), CAD (CTO RCA, moderate disease elsewhere by cath 12/15/2021), type 2 diabetes, hypertension, hyperlipidemia who was admitted to the hospital with progressive renal failure.  Cardiology is consulted for an elevated troponin in the setting of severe right-sided flank pain, and elevated white blood cell count possibly due to infectious source. ? ?Interval history: ?- WBC improving on antibiotics.  ?- She denies any shortness of breath or chest pain.  ?- Troponin increased this morning to 2200.  ? ?Review of systems complete and found to be negative unless listed above  ? ? ? ?Past Medical History:  ?Diagnosis Date  ? Coronary artery disease   ? Hypertension   ?  ?Past Surgical History:  ?Procedure Laterality Date  ? CARDIAC CATHETERIZATION Left 03/01/2016  ? Procedure: Left Heart Cath and Coronary Angiography;  Surgeon: Teodoro Spray, MD;  Location: [place] [facility];  Service: Cardiovascular;  Laterality: Left;  ? TEMPORARY DIALYSIS CATHETER N/A 01/04/2022  ? Procedure: TEMPORARY DIALYSIS CATHETER;  Surgeon: Algernon Huxley, MD;  Location: [place] [facility];  Service: Cardiovascular;  Laterality: N/A;  ?  ?Medications Prior to Admission  ?Medication Sig Dispense Refill Last Dose  ? aspirin EC 81 MG tablet Take 81 mg by mouth daily.   12/25/2021  ? atorvastatin (LIPITOR) 40 MG tablet Take 40 mg by mouth daily.   12/24/2021  ? gabapentin (NEURONTIN) 100 MG capsule Take 300 mg by mouth.   12/24/2021  ? insulin glargine (LANTUS) 100 UNIT/ML injection Inject 65 Units into the skin at bedtime.   12/24/2021 at 2300  ?  lisinopril-hydrochlorothiazide (PRINZIDE,ZESTORETIC) 20-12.5 MG tablet Take 1 tablet by mouth daily.   12/24/2021  ? carbamide peroxide (DEBROX) 6.5 % OTIC solution Place 5 drops into the right ear 2 (two) times daily. (Patient not taking: Reported on 12/26/2021) 15 mL 1 Not Taking  ? fluticasone (FLONASE) 50 MCG/ACT nasal spray Place 1 spray into both nostrils 2 (two) times daily. (Patient not taking: Reported on 12/26/2021) 16 g 0 Not Taking  ? insulin aspart protamine- aspart (NOVOLOG MIX 70/30) (70-30) 100 UNIT/ML injection Inject into the skin. (Patient not taking: Reported on 12/26/2021)   Not Taking  ? metFORMIN (GLUCOPHAGE) 500 MG tablet Take 500 mg by mouth 2 (two) times daily with a meal. (Patient not taking: Reported on 12/26/2021)   Not Taking  ? metoprolol succinate (TOPROL-XL) 100 MG 24 hr tablet Take 100 mg by mouth daily. Take with or immediately following a meal. (Patient not taking: Reported on 12/26/2021)   Not Taking  ? ?Social History  ? ?Socioeconomic History  ? Marital status: Single  ?  Spouse name: Not on file  ? Number of children: Not on file  ? Years of education: Not on file  ? Highest education level: Not on file  ?Occupational History  ? Not on file  ?Tobacco Use  ? Smoking status: Never  ? Smokeless tobacco: Not on file  ?Substance and Sexual Activity  ? Alcohol use: No  ? Drug use: No  ? Sexual activity: Not on file  ?Other Topics Concern  ? Not on file  ?Social History Narrative  ? Not on file  ? ?  Social Determinants of Health  ? ?Financial Resource Strain: Not on file  ?Food Insecurity: Not on file  ?Transportation Needs: Not on file  ?Physical Activity: Not on file  ?Stress: Not on file  ?Social Connections: Not on file  ?Intimate Partner Violence: Not on file  ?  ?History reviewed. No pertinent family history.  ? ? ?Review of systems complete and found to be negative unless listed above  ? ? ? ? ?PHYSICAL EXAM ? ?General: Well developed, well nourished, in no acute distress ?HEENT:   Normocephalic and atramatic ?Neck:  No JVD.  ?Lungs: Decreased breath sounds in right lung base. ?Heart: Tachycardic but regular . Normal S1 and S2 without gallops or murmurs.  ?Abdomen: Bowel sounds are positive, abdomen soft and non-tender  ?Msk:  Back normal, normal gait. Normal strength and tone for age. ?Extremities: No clubbing, cyanosis or edema.  HD catheter in right groin. ?Neuro: Alert and oriented X 3. ?Psych:  Good affect, responds appropriately ? ?Labs: ?  ?Lab Results  ?Component Value Date  ? WBC 15.0 (H) 01/08/2022  ? HGB 7.8 (L) 01/08/2022  ? HCT 24.9 (L) 01/08/2022  ? MCV 85.6 01/08/2022  ? PLT 235 01/08/2022  ?  ?Recent Labs  ?Lab 01/08/22 ?0159  ?NA 136  ?K 5.7*  ?CL 98  ?CO2 29  ?BUN 62*  ?CREATININE 2.95*  ?CALCIUM 8.2*  ?PROT 6.2*  ?BILITOT 0.4  ?ALKPHOS 218*  ?ALT 192*  ?AST 131*  ?GLUCOSE 172*  ? ? ?Lab Results  ?Component Value Date  ? TROPONINI <0.03 12/09/2017  ? ?  ?Lab Results  ?Component Value Date  ? CHOL 182 12/27/2021  ? ?Lab Results  ?Component Value Date  ? HDL 80 12/27/2021  ? ?Lab Results  ?Component Value Date  ? [place] 87 12/27/2021  ? ?Lab Results  ?Component Value Date  ? TRIG 77 12/27/2021  ? ?Lab Results  ?Component Value Date  ? CHOLHDL 2.3 12/27/2021  ? ?No results found for: LDLDIRECT  ?  ?Radiology: CT HEAD WO CONTRAST ? ?Result Date: 12/26/2021 ?CLINICAL DATA:  Intermittent numbness, weakness, and dizziness. Unable to lift right arm. EXAM: CT HEAD WITHOUT CONTRAST TECHNIQUE: Contiguous axial images were obtained from the base of the skull through the vertex without intravenous contrast. RADIATION DOSE REDUCTION: This exam was performed according to the departmental dose-optimization program which includes automated exposure control, adjustment of the mA and/or kV according to patient size and/or use of iterative reconstruction technique. COMPARISON:  None. FINDINGS: Brain: No evidence of acute infarction, hemorrhage, hydrocephalus, extra-axial collection or mass  lesion/mass effect. Cerebral volume within normal limits for age. Scattered mild periventricular and subcortical white matter hypodensities are nonspecific, but favored to reflect chronic microvascular ischemic changes. Vascular: Atherosclerotic vascular calcification of the carotid siphons. No hyperdense vessel. Skull: Normal. Negative for fracture or focal lesion. Sinuses/Orbits: No acute finding. Scattered mild paranasal sinus mucosal thickening. The orbits are unremarkable. Other: None. IMPRESSION: 1. No acute intracranial abnormality. 2. Mild chronic microvascular ischemic changes. Electronically Signed   By: Titus Dubin M.D.   On: 12/26/2021 16:52  ? ?MR ANGIO HEAD WO CONTRAST ? ?Result Date: 12/28/2021 ?CLINICAL DATA:  Stroke follow-up EXAM: MRA HEAD WITHOUT CONTRAST TECHNIQUE: Angiographic images of the Circle of Willis were acquired using MRA technique without intravenous contrast. COMPARISON:  No pertinent prior exam. FINDINGS: POSTERIOR CIRCULATION: --Vertebral arteries: Normal --Inferior cerebellar arteries: Normal. --Basilar artery: Normal. --Superior cerebellar arteries: Normal. --Posterior cerebral arteries: Normal. ANTERIOR CIRCULATION: --Intracranial internal carotid arteries: Normal. --  Anterior cerebral arteries (ACA): Normal. --Middle cerebral arteries (MCA): Normal. ANATOMIC VARIANTS: None Other: None. IMPRESSION: Normal intracranial MRA. Electronically Signed   By: Ulyses Jarred M.D.   On: 12/28/2021 02:10  ? ?MR Brain Wo Contrast (neuro protocol) ? ?Result Date: 12/26/2021 ?CLINICAL DATA:  Acute neurologic deficit EXAM: MRI HEAD WITHOUT CONTRAST TECHNIQUE: Multiplanar, multiecho pulse sequences of the brain and surrounding structures were obtained without intravenous contrast. COMPARISON:  None. FINDINGS: Brain: Punctate focus of reduced diffusion within the superior right pericallosal white matter. No other diffusion abnormality. No acute or chronic hemorrhage. Hyperintense T2-weighted signal  is moderately widespread throughout the white matter. Generalized volume loss without a clear lobar predilection. The midline structures are normal. Vascular: Major flow voids are preserved. Skull and upper cervical spine: Normal calvarium

## 2022-01-08 NOTE — Assessment & Plan Note (Signed)
Due to demand ischemia.  EKG yesterday showed lateral ST depression.  Cardiology seen and recommends heparin for 48 hours.  To be stopped on 4/17 ?

## 2022-01-08 NOTE — Progress Notes (Signed)
?[facility]  ?ROUNDING NOTE  ? ?Subjective:  ? ?Patient was moved to the ICU yesterday after rapid response ?Potassium noted to be elevated today at 5.7 ?Creatinine has further increased 2.95 from 2.11 yesterday morning. ?Patient reports that she was having severe right flank pain that was associated with nausea. ?Nursing staff reports that after she had a good bowel movement, pain has resolved ?Patient does not complain of pain this morning ?She is getting ready to eat her breakfast. ? ? ?Objective:  ?Vital signs in last 24 hours:  ?Temp:  [98 ?F (36.7 ?C)-99.1 ?F (37.3 ?C)] 98.1 ?F (36.7 ?C) (04/16 0800) ?Pulse Rate:  [95-134] 97 (04/16 0800) ?Resp:  [12-30] 16 (04/16 0800) ?BP: (113-209)/(58-108) 134/80 (04/16 0800) ?SpO2:  [79 %-100 %] 95 % (04/16 0800) ? ?Weight change:  ?Filed Weights  ? 01/05/22 1012 01/05/22 1327 01/06/22 1557  ?Weight: 81.6 kg 81.3 kg 82.2 kg  ? ? ?Intake/Output: ?I/O last 3 completed shifts: ?In: 2315.5 [P.O.:1440; I.V.:128.2; NG/GT:237; IV Piggyback:510.3] ?Out: 973 [Urine:275; Other:500] ?  ?Intake/Output this shift: ? Total I/O ?In: 7.5 [I.V.:7.5] ?Out: -  ? ?Physical Exam: ?General: NAD, sitting up in bed  ?Head: Normocephalic, atraumatic. Moist oral mucosal membranes  ?Eyes: Anicteric  ?Lungs:  Mild scattered rhonchi, otherwise normal effort  ?Heart: Regular rate and rhythm  ?Abdomen:  Soft, nontender  ?Extremities: No peripheral edema.  ?Neurologic: Nonfocal, moving all four extremities  ?Skin: No lesions  ?Access: Right femoral nontunneled hemodialysis catheter  ? ? ?Basic Metabolic Panel: ?Recent Labs  ?Lab 01/05/22 ?0313 01/06/22 ?0431 01/07/22 ?5329 01/07/22 ?1327 01/08/22 ?0159  ?NA 131* 135 137 136 136  ?K 5.5* 5.1 4.9 5.3* 5.7*  ?CL 99 101 99 99 98  ?CO2 '23 25 29 27 29  '$ ?GLUCOSE 251* 142* 59* 243* 172*  ?BUN 80* 68* 42* 48* 62*  ?CREATININE 3.65* 2.88* 2.11* 2.34* 2.95*  ?CALCIUM 8.1* 8.3* 8.5* 8.1* 8.2*  ?MG 1.6* 1.7  --   --  1.7  ?PHOS 5.0* 5.3*  5.3*  --    --  6.1*  ? ? ? ?Liver Function Tests: ?Recent Labs  ?Lab 01/05/22 ?0313 01/06/22 ?0431 01/07/22 ?9242 01/07/22 ?1327 01/08/22 ?0159  ?AST 94*  --  99* 155* 131*  ?ALT 152*  --  174* 192* 192*  ?ALKPHOS 152*  --  178* 227* 218*  ?BILITOT 0.5  --  0.5 0.7 0.4  ?PROT 6.2*  --  6.3* 6.5 6.2*  ?ALBUMIN 2.3* 2.4* 2.5* 2.5* 2.3*  ? ? ?No results for input(s): LIPASE, AMYLASE in the last 168 hours. ?No results for input(s): AMMONIA in the last 168 hours. ? ?CBC: ?Recent Labs  ?Lab 01/06/22 ?0431 01/06/22 ?1015 01/07/22 ?6834 01/07/22 ?1327 01/08/22 ?0159  ?WBC 16.8* 14.9* 20.4* 25.1* 15.0*  ?HGB 7.2* 6.5* 8.9* 8.7* 7.8*  ?HCT 23.1* 20.4* 27.4* 27.9* 24.9*  ?MCV 85.6 84.6 83.3 86.1 85.6  ?PLT 286 276 265 253 235  ? ? ? ?Cardiac Enzymes: ?No results for input(s): CKTOTAL, CKMB, CKMBINDEX, TROPONINI in the last 168 hours. ? ?BNP: ?Invalid input(s): POCBNP ? ?CBG: ?Recent Labs  ?Lab 01/07/22 ?1139 01/07/22 ?1723 01/07/22 ?1843 01/07/22 ?2107 01/08/22 ?1962  ?GLUCAP 150* 63* 95 171* 160*  ? ? ? ?Microbiology: ?Results for orders placed or performed during the hospital encounter of 12/26/21  ?Respiratory (~20 pathogens) panel by PCR     Status: Abnormal  ? Collection Time: 12/27/21 11:32 AM  ? Specimen: Nasopharyngeal Swab; Respiratory  ?Result Value Ref Range Status  ?  Adenovirus NOT DETECTED NOT DETECTED Final  ? Coronavirus 229E NOT DETECTED NOT DETECTED Final  ?  Comment: (NOTE) ?The Coronavirus on the Respiratory Panel, DOES NOT test for the novel  ?Coronavirus (2019 nCoV) ?  ? Coronavirus HKU1 NOT DETECTED NOT DETECTED Final  ? Coronavirus NL63 NOT DETECTED NOT DETECTED Final  ? Coronavirus OC43 NOT DETECTED NOT DETECTED Final  ? Metapneumovirus NOT DETECTED NOT DETECTED Final  ? Rhinovirus / Enterovirus NOT DETECTED NOT DETECTED Final  ? Influenza A NOT DETECTED NOT DETECTED Final  ? Influenza B NOT DETECTED NOT DETECTED Final  ? Parainfluenza Virus 1 NOT DETECTED NOT DETECTED Final  ? Parainfluenza Virus 2 NOT DETECTED  NOT DETECTED Final  ? Parainfluenza Virus 3 DETECTED (A) NOT DETECTED Final  ? Parainfluenza Virus 4 NOT DETECTED NOT DETECTED Final  ? Respiratory Syncytial Virus NOT DETECTED NOT DETECTED Final  ? Bordetella pertussis NOT DETECTED NOT DETECTED Final  ? Bordetella Parapertussis NOT DETECTED NOT DETECTED Final  ? Chlamydophila pneumoniae NOT DETECTED NOT DETECTED Final  ? Mycoplasma pneumoniae NOT DETECTED NOT DETECTED Final  ?  Comment: Performed at [facility], [place] [address] [place], [place] [address]  ?Resp Panel by RT-PCR (Flu A&B, Covid) Nasopharyngeal Swab     Status: None  ? Collection Time: 12/27/21 11:32 AM  ? Specimen: Nasopharyngeal Swab; Nasopharyngeal(NP) swabs in vial transport medium  ?Result Value Ref Range Status  ? SARS Coronavirus 2 by RT PCR NEGATIVE NEGATIVE Final  ?  Comment: (NOTE) ?SARS-CoV-2 target nucleic acids are NOT DETECTED. ? ?The SARS-CoV-2 RNA is generally detectable in upper respiratory ?specimens during the acute phase of infection. The lowest ?concentration of SARS-CoV-2 viral copies this assay can detect is ?138 copies/mL. A negative result does not preclude SARS-Cov-2 ?infection and should not be used as the sole basis for treatment or ?other patient management decisions. A negative result may occur with  ?improper specimen collection/handling, submission of specimen other ?than nasopharyngeal swab, presence of viral mutation(s) within the ?areas targeted by this assay, and inadequate number of viral ?copies(<138 copies/mL). A negative result must be combined with ?clinical observations, patient history, and epidemiological ?information. The expected result is Negative. ? ?Fact Sheet for Patients:  ?EntrepreneurPulse.com.au ? ?Fact Sheet for Healthcare Providers:  ?IncredibleEmployment.be ? ?This test is no t yet approved or cleared by the [place] FDA and  ?has been authorized for detection and/or diagnosis of SARS-CoV-2 by ?FDA  under an Emergency Use Authorization (EUA). This EUA will remain  ?in effect (meaning this test can be used) for the duration of the ?COVID-19 declaration under Section 564(b)(1) of the Act, 21 ?U.S.C.section 360bbb-3(b)(1), unless the authorization is terminated  ?or revoked sooner.  ? ? ?  ? Influenza A by PCR NEGATIVE NEGATIVE Final  ? Influenza B by PCR NEGATIVE NEGATIVE Final  ?  Comment: (NOTE) ?The Xpert Xpress SARS-CoV-2/FLU/RSV plus assay is intended as an aid ?in the diagnosis of influenza from Nasopharyngeal swab specimens and ?should not be used as a sole basis for treatment. Nasal washings and ?aspirates are unacceptable for Xpert Xpress SARS-CoV-2/FLU/RSV ?testing. ? ?Fact Sheet for Patients: ?EntrepreneurPulse.com.au ? ?Fact Sheet for Healthcare Providers: ?IncredibleEmployment.be ? ?This test is not yet approved or cleared by the [place] FDA and ?has been authorized for detection and/or diagnosis of SARS-CoV-2 by ?FDA under an Emergency Use Authorization (EUA). This EUA will remain ?in effect (meaning this test can be used) for the duration of the ?COVID-19 declaration under Section 564(b)(1)  of the Act, 21 U.S.C. ?section 360bbb-3(b)(1), unless the authorization is terminated or ?revoked. ? ?Performed at [facility], [place], ?[place] [address] ?  ?MRSA Next Gen by PCR, Nasal     Status: None  ? Collection Time: 01/07/22  1:34 PM  ? Specimen: Nasal Mucosa; Nasal Swab  ?Result Value Ref Range Status  ? MRSA by PCR Next Gen NOT DETECTED NOT DETECTED Final  ?  Comment: (NOTE) ?The GeneXpert MRSA Assay (FDA approved for NASAL specimens only), ?is one component of a comprehensive MRSA colonization surveillance ?program. It is not intended to diagnose MRSA infection nor to guide ?or monitor treatment for MRSA infections. ?Test performance is not FDA approved in patients less than 2 years ?old. ?Performed at [facility], [place], ?[place] [address] ?  ?Culture, blood (Routine X 2) w Reflex to ID Panel     Status: None (Preliminary result)  ? Collection Time: 01/07/22  5:51 PM  ? Specimen: BLOOD  ?Result Value Re

## 2022-01-08 NOTE — Assessment & Plan Note (Signed)
Nephro is given Lokelma.  Recheck potassium in the morning ?

## 2022-01-08 NOTE — Assessment & Plan Note (Signed)
Yesterday evening of no clear etiology.  Her flank pain is resolved now.  CT abdomen pelvis did not show any acute pathology ?

## 2022-01-08 NOTE — Progress Notes (Signed)
Troponin level is result is 2265, Dr. Manuella Ghazi notified who reached out to Dr. Corky Sox.  Dr. Corky Sox saw patient and orders placed for troponin level and repeat in two hours. ?

## 2022-01-08 NOTE — Progress Notes (Signed)
ANTICOAGULATION CONSULT NOTE ? ?Pharmacy Consult for heparin infusion ?Indication: chest pain/ACS ? ?Allergies  ?Allergen Reactions  ? Penicillins Rash  ? ? ?Patient Measurements: ?Height: '6\' 1"'$  (185.4 cm) ?Weight: 82.2 kg (181 lb 3.5 oz) ?IBW/kg (Calculated) : 75.4 ?Heparin Dosing Weight: 82.2 kg ? ?Vital Signs: ?Temp: 99 ?F (37.2 ?C) (04/15 2343) ?Temp Source: Oral (04/15 2343) ?BP: 139/67 (04/16 0300) ?Pulse Rate: 105 (04/16 0300) ? ?Labs: ?Recent Labs  ?  01/07/22 ?0651 01/07/22 ?1327 01/07/22 ?1521 01/07/22 ?1700 01/08/22 ?0156 01/08/22 ?0159  ?HGB 8.9* 8.7*  --   --   --  7.8*  ?HCT 27.4* 27.9*  --   --   --  24.9*  ?PLT 265 253  --   --   --  235  ?LABPROT  --   --   --  13.3  --   --   ?INR  --   --   --  1.0  --   --   ?HEPARINUNFRC  --   --   --   --  0.53  --   ?CREATININE 2.11* 2.34*  --   --   --  2.95*  ?TROPONINIHS  --  0,165* 1,591*  --   --   --   ? ? ? ?Estimated Creatinine Clearance: 21.1 mL/min (A) (by C-G formula based on SCr of 2.95 mg/dL (H)). ? ? ?Medical History: ?Past Medical History:  ?Diagnosis Date  ? Coronary artery disease   ? Hypertension   ? ? ?Medications:  ?Per chart review, no anticoagulation PTA. Was on [place] heparin for DVT ppx - last dose 4/15 '@1408'$  ? ?Assessment: ?71 year old female with past medical history of CAD, hypertension and chronic kidney disease stage IV. Pharmacy consulted for heparin dosing/monitoring.  ? ?Goal of Therapy:  ?Heparin level 0.3-0.7 units/ml ?Monitor platelets by anticoagulation protocol: Yes ? ?Results: ?Date/time Level  Comment ?4/16 0156 HL 0.53 Therapeutic x 1 , infusion @ 950un/hr ?  ?Plan:  ?Heparin level therapeutic x 1. Will continue heparin infusion at 950 units/hr ?Check anti-Xa level in 8 hours to confirm therapeutic rate ?Continue to monitor H&H and platelets ? ?Nivea Wojdyla Rodriguez-Guzman PharmD, BCPS ?01/08/2022 3:23 AM ? ? ?

## 2022-01-08 NOTE — Progress Notes (Signed)
? ?CRITICAL CARE PROGRESS NOTE ? ? ? ?Name: Deanna [name] ?MRN: [id number] ?DOB: November 01, 1950 ? ?   ?LOS: 11 ? ? ?SUBJECTIVE FINDINGS & SIGNIFICANT EVENTS  ? ? ?Patient description:  ?71 yo F with HFrEF, CAD, HTN, CKD on hd had rapid response reports acute SOB and Dyspnea with desaturation, had CT done showing cental predominant GGO with R moderate plueral effusion and anasarca. Has R fem HD catheter.  She was initially admitted with viral LRTI due to parainfluenza. PCCM consult for further evaluation and management  ? ? ?01/08/22- no acute events overnight, patient had IR eval for thora but no safe window found effusion remains untapped on left.  ? ?Lines/tubes ?:  ? ?Microbiology/Sepsis markers: ?Results for orders placed or performed during the hospital encounter of 12/26/21  ?Respiratory (~20 pathogens) panel by PCR     Status: Abnormal  ? Collection Time: 12/27/21 11:32 AM  ? Specimen: Nasopharyngeal Swab; Respiratory  ?Result Value Ref Range Status  ? Adenovirus NOT DETECTED NOT DETECTED Final  ? Coronavirus 229E NOT DETECTED NOT DETECTED Final  ?  Comment: (NOTE) ?The Coronavirus on the Respiratory Panel, DOES NOT test for the novel  ?Coronavirus (2019 nCoV) ?  ? Coronavirus HKU1 NOT DETECTED NOT DETECTED Final  ? Coronavirus NL63 NOT DETECTED NOT DETECTED Final  ? Coronavirus OC43 NOT DETECTED NOT DETECTED Final  ? Metapneumovirus NOT DETECTED NOT DETECTED Final  ? Rhinovirus / Enterovirus NOT DETECTED NOT DETECTED Final  ? Influenza A NOT DETECTED NOT DETECTED Final  ? Influenza B NOT DETECTED NOT DETECTED Final  ? Parainfluenza Virus 1 NOT DETECTED NOT DETECTED Final  ? Parainfluenza Virus 2 NOT DETECTED NOT DETECTED Final  ? Parainfluenza Virus 3 DETECTED (A) NOT DETECTED Final  ? Parainfluenza Virus 4 NOT DETECTED NOT DETECTED Final  ?  Respiratory Syncytial Virus NOT DETECTED NOT DETECTED Final  ? Bordetella pertussis NOT DETECTED NOT DETECTED Final  ? Bordetella Parapertussis NOT DETECTED NOT DETECTED Final  ? Chlamydophila pneumoniae NOT DETECTED NOT DETECTED Final  ? Mycoplasma pneumoniae NOT DETECTED NOT DETECTED Final  ?  Comment: Performed at [facility], [place] [address], [place], [place] [address]  ?Resp Panel by RT-PCR (Flu A&B, Covid) Nasopharyngeal Swab     Status: None  ? Collection Time: 12/27/21 11:32 AM  ? Specimen: Nasopharyngeal Swab; Nasopharyngeal(NP) swabs in vial transport medium  ?Result Value Ref Range Status  ? SARS Coronavirus 2 by RT PCR NEGATIVE NEGATIVE Final  ?  Comment: (NOTE) ?SARS-CoV-2 target nucleic acids are NOT DETECTED. ? ?The SARS-CoV-2 RNA is generally detectable in upper respiratory ?specimens during the acute phase of infection. The lowest ?concentration of SARS-CoV-2 viral copies this assay can detect is ?138 copies/mL. A negative result does not preclude SARS-Cov-2 ?infection and should not be used as the sole basis for treatment or ?other patient management decisions. A negative result may occur with  ?improper specimen collection/handling, submission of specimen other ?than nasopharyngeal swab, presence of viral mutation(s) within the ?areas targeted by this assay, and inadequate number of viral ?copies(<138 copies/mL). A negative result must be combined with ?clinical observations, patient history, and epidemiological ?information. The expected result is Negative. ? ?Fact Sheet for Patients:  ?EntrepreneurPulse.com.au ? ?Fact Sheet for Healthcare Providers:  ?IncredibleEmployment.be ? ?This test is no t yet approved or cleared by the [place] FDA and  ?has been authorized for detection and/or diagnosis of SARS-CoV-2 by ?FDA under an Emergency Use Authorization (EUA). This EUA will remain  ?in  effect (meaning this test can be used) for the duration of  the ?COVID-19 declaration under Section 564(b)(1) of the Act, 21 ?U.S.C.section 360bbb-3(b)(1), unless the authorization is terminated  ?or revoked sooner.  ? ? ?  ? Influenza A by PCR NEGATIVE NEGATIVE Final  ? Influenza B by PCR NEGATIVE NEGATIVE Final  ?  Comment: (NOTE) ?The Xpert Xpress SARS-CoV-2/FLU/RSV plus assay is intended as an aid ?in the diagnosis of influenza from Nasopharyngeal swab specimens and ?should not be used as a sole basis for treatment. Nasal washings and ?aspirates are unacceptable for Xpert Xpress SARS-CoV-2/FLU/RSV ?testing. ? ?Fact Sheet for Patients: ?EntrepreneurPulse.com.au ? ?Fact Sheet for Healthcare Providers: ?IncredibleEmployment.be ? ?This test is not yet approved or cleared by the [place] FDA and ?has been authorized for detection and/or diagnosis of SARS-CoV-2 by ?FDA under an Emergency Use Authorization (EUA). This EUA will remain ?in effect (meaning this test can be used) for the duration of the ?COVID-19 declaration under Section 564(b)(1) of the Act, 21 U.S.C. ?section 360bbb-3(b)(1), unless the authorization is terminated or ?revoked. ? ?Performed at [facility], [place], ?[place] [address] ?  ?MRSA Next Gen by PCR, Nasal     Status: None  ? Collection Time: 01/07/22  1:34 PM  ? Specimen: Nasal Mucosa; Nasal Swab  ?Result Value Ref Range Status  ? MRSA by PCR Next Gen NOT DETECTED NOT DETECTED Final  ?  Comment: (NOTE) ?The GeneXpert MRSA Assay (FDA approved for NASAL specimens only), ?is one component of a comprehensive MRSA colonization surveillance ?program. It is not intended to diagnose MRSA infection nor to guide ?or monitor treatment for MRSA infections. ?Test performance is not FDA approved in patients less than 2 years ?old. ?Performed at [facility], [place], ?[place] [address] ?  ?Culture, blood (Routine X 2) w Reflex to ID Panel     Status: None (Preliminary result)   ? Collection Time: 01/07/22  5:51 PM  ? Specimen: BLOOD  ?Result Value Ref Range Status  ? Specimen Description BLOOD RIGHT ANTECUBITAL  Final  ? Special Requests   Final  ?  BOTTLES DRAWN AEROBIC AND ANAEROBIC Blood Culture adequate volume  ? Culture   Final  ?  NO GROWTH < 12 HOURS ?Performed at [facility], [address] [place], [place] [address] ?  ? Report Status PENDING  Incomplete  ?Culture, blood (Routine X 2) w Reflex to ID Panel     Status: None (Preliminary result)  ? Collection Time: 01/07/22  5:51 PM  ? Specimen: BLOOD  ?Result Value Ref Range Status  ? Specimen Description BLOOD BLOOD LEFT WRIST  Final  ? Special Requests   Final  ?  BOTTLES DRAWN AEROBIC AND ANAEROBIC Blood Culture adequate volume  ? Culture   Final  ?  NO GROWTH < 12 HOURS ?Performed at [facility], [address] [place], [place] [address] ?  ? Report Status PENDING  Incomplete  ? ? ?Anti-infectives:  ?Anti-infectives (From admission, onward)  ? ? Start     Dose/Rate Route Frequency Ordered Stop  ? 01/09/22 1800  vancomycin (VANCOREADY) IVPB 1750 mg/350 mL  Status:  Discontinued       ? 1,750 mg ?175 mL/hr over 120 Minutes Intravenous Every 48 hours 01/07/22 1436 01/08/22 1025  ? 01/07/22 1530  vancomycin (VANCOREADY) IVPB 2000 mg/400 mL       ? 2,000 mg ?200 mL/hr over 120 Minutes Intravenous  Once 01/07/22 1436  01/07/22 1927  ? 01/07/22 1530  ceFEPIme (MAXIPIME) 2 g in sodium chloride 0.9 % 100 mL IVPB       ? 2 g ?200 mL/hr over 30 Minutes Intravenous Every 24 hours 01/07/22 1436    ? 01/04/22 1204  vancomycin (VANCOCIN) IVPB 1000 mg/200 mL premix  Status:  Discontinued       ? 1,000 mg ?200 mL/hr over 60 Minutes Intravenous 60 min pre-op 01/04/22 1204 01/04/22 1410  ? 12/28/21 1000  azithromycin (ZITHROMAX) tablet 250 mg       ?See Hyperspace for full Linked Orders Report.  ? 250 mg Oral Daily 12/27/21 1114 12/31/21 0813  ? 12/27/21 1200  cefTRIAXone (ROCEPHIN) 2 g in sodium chloride 0.9 % 100 mL IVPB        ? 2 g ?200 mL/hr over 30 Minutes Intravenous Every 24 hours 12/27/21 1114 12/31/21 1300  ? 12/27/21 1200  azithromycin (ZITHROMAX) tablet 500 mg       ?See Hyperspace for full Linked Orders Report.  ? 5

## 2022-01-09 DIAGNOSIS — J181 Lobar pneumonia, unspecified organism: Secondary | ICD-10-CM | POA: Diagnosis not present

## 2022-01-09 DIAGNOSIS — N179 Acute kidney failure, unspecified: Secondary | ICD-10-CM | POA: Diagnosis not present

## 2022-01-09 DIAGNOSIS — I639 Cerebral infarction, unspecified: Secondary | ICD-10-CM | POA: Diagnosis not present

## 2022-01-09 DIAGNOSIS — R531 Weakness: Secondary | ICD-10-CM | POA: Diagnosis not present

## 2022-01-09 LAB — CBC
HCT: 22.4 % — ABNORMAL LOW (ref 36.0–46.0)
Hemoglobin: 7 g/dL — ABNORMAL LOW (ref 12.0–15.0)
MCH: 27.3 pg (ref 26.0–34.0)
MCHC: 31.3 g/dL (ref 30.0–36.0)
MCV: 87.5 fL (ref 80.0–100.0)
Platelets: 238 10*3/uL (ref 150–400)
RBC: 2.56 MIL/uL — ABNORMAL LOW (ref 3.87–5.11)
RDW: 15.2 % (ref 11.5–15.5)
WBC: 11 10*3/uL — ABNORMAL HIGH (ref 4.0–10.5)
nRBC: 0.2 % (ref 0.0–0.2)

## 2022-01-09 LAB — IRON AND TIBC
Iron: 17 ug/dL — ABNORMAL LOW (ref 28–170)
Saturation Ratios: 6 % — ABNORMAL LOW (ref 10.4–31.8)
TIBC: 265 ug/dL (ref 250–450)
UIBC: 248 ug/dL

## 2022-01-09 LAB — POTASSIUM
Potassium: 6.1 mmol/L — ABNORMAL HIGH (ref 3.5–5.1)
Potassium: 4.6 mmol/L (ref 3.5–5.1)

## 2022-01-09 LAB — GLUCOSE, CAPILLARY
Glucose-Capillary: 110 mg/dL — ABNORMAL HIGH (ref 70–99)
Glucose-Capillary: 188 mg/dL — ABNORMAL HIGH (ref 70–99)
Glucose-Capillary: 224 mg/dL — ABNORMAL HIGH (ref 70–99)
Glucose-Capillary: 264 mg/dL — ABNORMAL HIGH (ref 70–99)
Glucose-Capillary: 95 mg/dL (ref 70–99)

## 2022-01-09 LAB — PARATHYROID HORMONE, INTACT (NO CA): PTH: 396 pg/mL — ABNORMAL HIGH (ref 15–65)

## 2022-01-09 LAB — RENAL FUNCTION PANEL
Albumin: 2.3 g/dL — ABNORMAL LOW (ref 3.5–5.0)
Anion gap: 12 (ref 5–15)
BUN: 75 mg/dL — ABNORMAL HIGH (ref 8–23)
CO2: 29 mmol/L (ref 22–32)
Calcium: 8.5 mg/dL — ABNORMAL LOW (ref 8.9–10.3)
Chloride: 97 mmol/L — ABNORMAL LOW (ref 98–111)
Creatinine, Ser: 3.5 mg/dL — ABNORMAL HIGH (ref 0.44–1.00)
GFR, Estimated: 13 mL/min — ABNORMAL LOW (ref >60)
Glucose, Bld: 197 mg/dL — ABNORMAL HIGH (ref 70–99)
Phosphorus: 5.8 mg/dL — ABNORMAL HIGH (ref 2.5–4.6)
Potassium: 5.9 mmol/L — ABNORMAL HIGH (ref 3.5–5.1)
Sodium: 138 mmol/L (ref 135–145)

## 2022-01-09 LAB — PREPARE RBC (CROSSMATCH)

## 2022-01-09 LAB — TRANSFERRIN: Transferrin: 193 mg/dL (ref 192–382)

## 2022-01-09 LAB — HEPARIN LEVEL (UNFRACTIONATED): Heparin Unfractionated: 0.3 IU/mL (ref 0.30–0.70)

## 2022-01-09 LAB — MAGNESIUM: Magnesium: 1.8 mg/dL (ref 1.7–2.4)

## 2022-01-09 LAB — FERRITIN: Ferritin: 216 ng/mL (ref 11–307)

## 2022-01-09 MED ORDER — SACUBITRIL-VALSARTAN 24-26 MG PO TABS
1.0000 | ORAL_TABLET | Freq: Two times a day (BID) | ORAL | Status: DC
  Administered 2022-01-09 – 2022-01-12 (×6): 1 via ORAL
  Filled 2022-01-09 (×9): qty 1

## 2022-01-09 MED ORDER — DIPHENHYDRAMINE HCL 50 MG/ML IJ SOLN: 50.0000 mg | Freq: Once | INTRAMUSCULAR | Status: DC | PRN

## 2022-01-09 MED ORDER — METOPROLOL TARTRATE 25 MG PO TABS
25.0000 mg | ORAL_TABLET | Freq: Two times a day (BID) | ORAL | Status: DC
  Administered 2022-01-09 – 2022-01-12 (×5): 25 mg via ORAL
  Filled 2022-01-09 (×5): qty 1

## 2022-01-09 MED ORDER — ONDANSETRON HCL 4 MG/2ML IJ SOLN
4.0000 mg | Freq: Four times a day (QID) | INTRAMUSCULAR | Status: DC | PRN
Start: 2022-01-09 — End: 2022-01-13

## 2022-01-09 MED ORDER — OXYCODONE HCL 5 MG PO TABS
10.0000 mg | ORAL_TABLET | Freq: Four times a day (QID) | ORAL | Status: DC | PRN
  Administered 2022-01-10: 5 mg via ORAL
  Filled 2022-01-09: qty 2

## 2022-01-09 MED ORDER — MIDAZOLAM HCL 2 MG/ML PO SYRP
8.0000 mg | ORAL_SOLUTION | Freq: Once | ORAL | Status: DC | PRN
Start: 2022-01-09 — End: 2022-01-10
  Filled 2022-01-09: qty 4

## 2022-01-09 MED ORDER — SODIUM CHLORIDE 0.9% IV SOLUTION: Freq: Once | INTRAVENOUS | Status: DC

## 2022-01-09 MED ORDER — HYDROMORPHONE HCL 1 MG/ML IJ SOLN: 1.0000 mg | Freq: Once | INTRAMUSCULAR | Status: DC | PRN

## 2022-01-09 MED ORDER — CHLORHEXIDINE GLUCONATE CLOTH 2 % EX PADS
6.0000 | MEDICATED_PAD | Freq: Every day | CUTANEOUS | Status: DC
  Administered 2022-01-10: 6 via TOPICAL

## 2022-01-09 MED ORDER — CARVEDILOL 6.25 MG PO TABS: 12.5000 mg | ORAL_TABLET | Freq: Two times a day (BID) | ORAL | Status: DC

## 2022-01-09 MED ORDER — HEPARIN SODIUM (PORCINE) 1000 UNIT/ML IJ SOLN
INTRAMUSCULAR | Status: AC
  Administered 2022-01-09: 3600 [IU]
  Filled 2022-01-09: qty 10

## 2022-01-09 MED ORDER — METHYLPREDNISOLONE SODIUM SUCC 125 MG IJ SOLR
125.0000 mg | Freq: Once | INTRAMUSCULAR | Status: DC | PRN
Start: 2022-01-09 — End: 2022-01-10

## 2022-01-09 MED ORDER — FAMOTIDINE 20 MG PO TABS: 40.0000 mg | ORAL_TABLET | Freq: Once | ORAL | Status: DC | PRN

## 2022-01-09 NOTE — Assessment & Plan Note (Addendum)
Acute kidney injury on chronic kidney disease stage IV.  Baseline creatinine around 3.19 as per care everywhere. ?Lab Results  ?Component Value Date  ? CREATININE 3.50 (H) 01/09/2022  ? CREATININE 2.95 (H) 01/08/2022  ? CREATININE 2.34 (H) 01/07/2022  ?Dialysis session today. Next one Wednesday  ?

## 2022-01-09 NOTE — Progress Notes (Signed)
ANTICOAGULATION CONSULT NOTE ? ?Pharmacy Consult for heparin infusion ?Indication: chest pain/ACS ? ?Allergies  ?Allergen Reactions  ? Penicillins Rash  ? ? ?Patient Measurements: ?Height: '6\' 1"'$  (185.4 cm) ?Weight: 82.2 kg (181 lb 3.5 oz) ?IBW/kg (Calculated) : 75.4 ?Heparin Dosing Weight: 82.2 kg ? ?Vital Signs: ?Temp: 98.3 ?F (36.8 ?C) (04/17 2423) ?Temp Source: Oral (04/16 1940) ?BP: 158/76 (04/17 0027) ?Pulse Rate: 110 (04/17 0027) ? ?Labs: ?Recent Labs  ?  01/07/22 ?1327 01/07/22 ?1521 01/07/22 ?1700 01/08/22 ?0156 01/08/22 ?0159 01/08/22 ?0931 01/08/22 ?1213 01/08/22 ?1406 01/09/22 ?5361  ?HGB 8.7*  --   --   --  7.8*  --   --   --  7.0*  ?HCT 27.9*  --   --   --  24.9*  --   --   --  22.4*  ?PLT 253  --   --   --  235  --   --   --  238  ?LABPROT  --   --  13.3  --   --   --   --   --   --   ?INR  --   --  1.0  --   --   --   --   --   --   ?HEPARINUNFRC  --   --   --  0.53  --  0.51  --   --  0.30  ?CREATININE 2.34*  --   --   --  2.95*  --   --   --  3.50*  ?TROPONINIHS 1,329*   < >  --   --   --  2,265* 1,960* 2,044*  --   ? < > = values in this interval not displayed.  ? ? ? ?Estimated Creatinine Clearance: 17.8 mL/min (A) (by C-G formula based on SCr of 3.5 mg/dL (H)). ? ? ?Medical History: ?Past Medical History:  ?Diagnosis Date  ? Coronary artery disease   ? Hypertension   ? ? ?Medications:  ?Per chart review, no anticoagulation PTA. Was on [place] heparin for DVT ppx - last dose 4/15 '@1408'$  ? ?Assessment: ?71 year old female with past medical history of CAD, hypertension and chronic kidney disease stage IV. Pharmacy consulted for heparin dosing/monitoring.  ? ?Goal of Therapy:  ?Heparin level 0.3-0.7 units/ml ?Monitor platelets by anticoagulation protocol: Yes ? ?Results: ?Date/time Level  Comment ?4/16 0156 HL 0.53 Therapeutic x 1 , infusion @ 950un/hr ?4/16 0931 HL 0.51 Therapeutic x 2 ?4/17 0537 HL 0.30 Therapeutic (low part of range) ?  ?Plan:  ?Heparin level remains therapeutic but al lowest part of  range and decreased since significantly deceased. Will continue heparin infusion at 950 units/hr and repeat HL in 8hrs. ?Continue to monitor H&H and platelets ? ?Desia Saban Rodriguez-Guzman PharmD, BCPS ?01/09/2022 6:30 AM ? ? ? ? ?

## 2022-01-09 NOTE — Assessment & Plan Note (Signed)
Status post Lokelma yesterday.  Potassium still 6.1.  Now getting dialysis today through temporary HD catheter ?

## 2022-01-09 NOTE — TOC Progression Note (Signed)
Transition of Care (TOC) - Progression Note  ? ? ?Patient Details  ?Name: Deanna [name] ?MRN: [id number] ?Date of Birth: May 25, 1951 ? ?Transition of Care (TOC) CM/SW Contact  ?Candie Chroman, LCSW ?Phone Number: ?01/09/2022, 10:13 AM ? ?Clinical Narrative:  Confirmed HD coordinator is following for potential outpatient HD placement.  ? ?Expected Discharge Plan: [place] ?Barriers to Discharge: Continued Medical Work up ? ?Expected Discharge Plan and Services ?Expected Discharge Plan: [place] ?  ?Discharge Planning Services: CM Consult ?Post Acute Care Choice: Durable Medical Equipment, Home Health ?Living arrangements for the past 2 months: [place] ?                ?DME Arranged: 3-N-1 ?DME Agency: [employer] ?Date DME Agency Contacted: 12/29/21 ?Time DME Agency Contacted: [id number] ?Representative spoke with at DME Agency: Suanne Marker ?HH Arranged: OT, PT ?  ?  ?  ?  ? ? ?Social Determinants of Health (SDOH) Interventions ?  ? ?Readmission Risk Interventions ? ?  12/29/2021  ?  2:33 PM  ?Readmission Risk Prevention Plan  ?Transportation Screening Complete  ?PCP or Specialist Appt within 5-7 Days Complete  ?Home Care Screening Complete  ?Medication Review (RN CM) Complete  ? ? ?

## 2022-01-09 NOTE — Assessment & Plan Note (Signed)
Continue aspirin Plavix and Lipitor.  LDL 87 ?

## 2022-01-09 NOTE — Progress Notes (Signed)
Hemodialysis Post Treatment Note: ? ?Tx date:01/09/2022 ?Tx time:2 hours and 30 minutes ?Access:Right femoral catheter ?UF Removed: 521m ? ?Note:  ? ?HD completed, tolerated well. No complications. ? ?Blood transfusion done during treatment. Completed and no reaction during transfusion. Patient is asymptomatic.  ? ?Latest potassium is 4.6 ? ? ? ? ? ? ? ? ?  ?

## 2022-01-09 NOTE — Assessment & Plan Note (Signed)
Continue insulin Semglee.  NovoLog and sliding scale insulin.   ? ?

## 2022-01-09 NOTE — Assessment & Plan Note (Signed)
Slowly improving.  No clear etiology.  Continue PT, OT ?

## 2022-01-09 NOTE — Progress Notes (Signed)
[facility] Cardiology ? ?CARDIOLOGY CONSULT NOTE  ?Patient ID: ?Deanna [name] ?MRN: [id number] ?DOB/AGE: 1951/05/17 71 y.o. ? ?Admit date: 12/26/2021 ?Referring Physician Costin Ghergehe ?Primary Physician Center, [place] ?Primary Cardiologist Baxter Hire, MD ?Reason for Consultation Elevated troponin ? ?HPI:  ?Deanna [name] is a 71 year old female with CKD 5, HFrEF with EF 30%), CAD (CTO RCA, moderate disease elsewhere by cath 12/15/2021), type 2 diabetes, hypertension, hyperlipidemia who was admitted to the hospital with progressive renal failure.  Cardiology is consulted for an elevated troponin in the setting of severe right-sided flank pain, and elevated white blood cell count possibly due to infectious source. ? ?Interval history: ?- seen after dialysis today, removed 54m and transfused 1 unit  ?- feels well with more energy than she has had in a while.  ?- denies chest pain, SOB, palpitations.  ? ?Review of systems complete and found to be negative unless listed above  ? ? ? ?Past Medical History:  ?Diagnosis Date  ? Coronary artery disease   ? Hypertension   ?  ?Past Surgical History:  ?Procedure Laterality Date  ? CARDIAC CATHETERIZATION Left 03/01/2016  ? Procedure: Left Heart Cath and Coronary Angiography;  Surgeon: KTeodoro Spray MD;  [facility];  Service: Cardiovascular;  Laterality: Left;  ? TEMPORARY DIALYSIS CATHETER N/A 01/04/2022  ? Procedure: TEMPORARY DIALYSIS CATHETER;  Surgeon: DAlgernon Huxley MD;  [facility];  Service: Cardiovascular;  Laterality: N/A;  ?  ?Medications Prior to Admission  ?Medication Sig Dispense Refill Last Dose  ? aspirin EC 81 MG tablet Take 81 mg by mouth daily.   12/25/2021  ? atorvastatin (LIPITOR) 40 MG tablet Take 40 mg by mouth daily.   12/24/2021  ? carvedilol (COREG) 12.5 MG tablet Take 12.5 mg by mouth 2 (two) times daily with a meal.   4/2  ? gabapentin (NEURONTIN) 100 MG capsule Take 300 mg by mouth.   12/24/2021  ?  insulin glargine (LANTUS) 100 UNIT/ML injection Inject 65 Units into the skin at bedtime.   12/24/2021 at 2300  ? sacubitril-valsartan (ENTRESTO) 24-26 MG Take 1 tablet by mouth 2 (two) times daily.   4/2  ? empagliflozin (JARDIANCE) 10 MG TABS tablet Take 10 mg by mouth daily. (Patient not taking: Reported on 01/09/2022)   Not Taking  ? metFORMIN (GLUCOPHAGE) 500 MG tablet Take 500 mg by mouth 2 (two) times daily with a meal. (Patient not taking: Reported on 12/26/2021)   Not Taking  ? ?Social History  ? ?Socioeconomic History  ? Marital status: Single  ?  Spouse name: Not on file  ? Number of children: Not on file  ? Years of education: Not on file  ? Highest education level: Not on file  ?Occupational History  ? Not on file  ?Tobacco Use  ? Smoking status: Never  ? Smokeless tobacco: Not on file  ?Substance and Sexual Activity  ? Alcohol use: No  ? Drug use: No  ? Sexual activity: Not on file  ?Other Topics Concern  ? Not on file  ?Social History Narrative  ? Not on file  ? ?Social Determinants of Health  ? ?Financial Resource Strain: Not on file  ?Food Insecurity: Not on file  ?Transportation Needs: Not on file  ?Physical Activity: Not on file  ?Stress: Not on file  ?Social Connections: Not on file  ?Intimate Partner Violence: Not on file  ?  ?History reviewed. No pertinent family history.  ? ?PHYSICAL EXAM ? ?  General: Elderly black female, Well developed, well nourished, in no acute distress. Sitting at incline in hospital bed.  ?HEENT:  Normocephalic and atraumatic ?Neck:  No JVD.  ?Lungs: Normal respiratory effort on 2.5L by [place]. Decreased breath sounds in right lung base. ?Heart: Tachycardic but regular . Normal S1 and S2 without gallops or murmurs.  ?Abdomen: non-distended appearing.  ?Msk: Normal strength and tone for age. ?Extremities: No clubbing, cyanosis. Trace bilateral ankle edema.  HD catheter in right groin. ?Neuro: Alert and oriented X 3. ?Psych:  Good affect, responds appropriately ? ?Labs: ?  ?Lab  Results  ?Component Value Date  ? WBC 11.0 (H) 01/09/2022  ? HGB 7.0 (L) 01/09/2022  ? HCT 22.4 (L) 01/09/2022  ? MCV 87.5 01/09/2022  ? PLT 238 01/09/2022  ?  ?Recent Labs  ?Lab 01/08/22 ?0159 01/09/22 ?6644 01/09/22 ?1034 01/09/22 ?1124  ?NA 136 138  --   --   ?K 5.7* 5.9*   < > 4.6  ?CL 98 97*  --   --   ?CO2 29 29  --   --   ?BUN 62* 75*  --   --   ?CREATININE 2.95* 3.50*  --   --   ?CALCIUM 8.2* 8.5*  --   --   ?PROT 6.2*  --   --   --   ?BILITOT 0.4  --   --   --   ?ALKPHOS 218*  --   --   --   ?ALT 192*  --   --   --   ?AST 131*  --   --   --   ?GLUCOSE 172* 197*  --   --   ? < > = values in this interval not displayed.  ? ? ?Lab Results  ?Component Value Date  ? TROPONINI <0.03 12/09/2017  ? ?  ?Lab Results  ?Component Value Date  ? CHOL 182 12/27/2021  ? ?Lab Results  ?Component Value Date  ? HDL 80 12/27/2021  ? ?Lab Results  ?Component Value Date  ? [place] 87 12/27/2021  ? ?Lab Results  ?Component Value Date  ? TRIG 77 12/27/2021  ? ?Lab Results  ?Component Value Date  ? CHOLHDL 2.3 12/27/2021  ? ?No results found for: LDLDIRECT  ?  ?Radiology: CT HEAD WO CONTRAST ? ?Result Date: 12/26/2021 ?CLINICAL DATA:  Intermittent numbness, weakness, and dizziness. Unable to lift right arm. EXAM: CT HEAD WITHOUT CONTRAST TECHNIQUE: Contiguous axial images were obtained from the base of the skull through the vertex without intravenous contrast. RADIATION DOSE REDUCTION: This exam was performed according to the departmental dose-optimization program which includes automated exposure control, adjustment of the mA and/or kV according to patient size and/or use of iterative reconstruction technique. COMPARISON:  None. FINDINGS: Brain: No evidence of acute infarction, hemorrhage, hydrocephalus, extra-axial collection or mass lesion/mass effect. Cerebral volume within normal limits for age. Scattered mild periventricular and subcortical white matter hypodensities are nonspecific, but favored to reflect chronic microvascular  ischemic changes. Vascular: Atherosclerotic vascular calcification of the carotid siphons. No hyperdense vessel. Skull: Normal. Negative for fracture or focal lesion. Sinuses/Orbits: No acute finding. Scattered mild paranasal sinus mucosal thickening. The orbits are unremarkable. Other: None. IMPRESSION: 1. No acute intracranial abnormality. 2. Mild chronic microvascular ischemic changes. Electronically Signed   By: Titus Dubin M.D.   On: 12/26/2021 16:52  ? ?MR ANGIO HEAD WO CONTRAST ? ?Result Date: 12/28/2021 ?CLINICAL DATA:  Stroke follow-up EXAM: MRA HEAD WITHOUT CONTRAST TECHNIQUE: Angiographic images of the  Circle of Willis were acquired using MRA technique without intravenous contrast. COMPARISON:  No pertinent prior exam. FINDINGS: POSTERIOR CIRCULATION: --Vertebral arteries: Normal --Inferior cerebellar arteries: Normal. --Basilar artery: Normal. --Superior cerebellar arteries: Normal. --Posterior cerebral arteries: Normal. ANTERIOR CIRCULATION: --Intracranial internal carotid arteries: Normal. --Anterior cerebral arteries (ACA): Normal. --Middle cerebral arteries (MCA): Normal. ANATOMIC VARIANTS: None Other: None. IMPRESSION: Normal intracranial MRA. Electronically Signed   By: Ulyses Jarred M.D.   On: 12/28/2021 02:10  ? ?MR Brain Wo Contrast (neuro protocol) ? ?Result Date: 12/26/2021 ?CLINICAL DATA:  Acute neurologic deficit EXAM: MRI HEAD WITHOUT CONTRAST TECHNIQUE: Multiplanar, multiecho pulse sequences of the brain and surrounding structures were obtained without intravenous contrast. COMPARISON:  None. FINDINGS: Brain: Punctate focus of reduced diffusion within the superior right pericallosal white matter. No other diffusion abnormality. No acute or chronic hemorrhage. Hyperintense T2-weighted signal is moderately widespread throughout the white matter. Generalized volume loss without a clear lobar predilection. The midline structures are normal. Vascular: Major flow voids are preserved. Skull and  upper cervical spine: Normal calvarium and skull base. Visualized upper cervical spine and soft tissues are normal. Sinuses/Orbits:Bilateral mastoid fluid.  Normal orbits. IMPRESSION: 1. Punctate focus of r

## 2022-01-09 NOTE — Progress Notes (Signed)
OT Cancellation Note ? ?Patient Details ?Name: Deanna [name] ?MRN: [id number] ?DOB: 10-10-1950 ? ? ?Cancelled Treatment:    Reason Eval/Treat Not Completed: Medical issues which prohibited therapy. Chart reviewed - pt noted to have K+ critically high at 5.9; contraindicated for exertional activity at this time. Additionally, pt noted to have transferred to CCU 4/15 and OT currently without continue at transfer orders. OT to sign off at this time. Please re-consult when pt becomes medically appropriate.  ? ?Fredirick Maudlin, OTR/L ?[place] ? ?

## 2022-01-09 NOTE — Progress Notes (Signed)
?Progress Note ? ? ?PatientShakeria [name] CZY:[id number] DOB: [birth year] DOA: 12/26/2021     12 ?DOS: the patient was seen and examined on 01/09/2022 ?  ?Brief hospital course: ?71 year old female with past medical history of CAD, hypertension and chronic kidney disease stage IV presented with right-sided numbness and weakness. ? ?MRI of the brain shows a punctate focus of reduced diffusion within the right superior pericallosal white matter. MRI of the cervical and thoracic spine showed mild spinal canal stenosis and moderate right neuroforaminal stenosis at C6-7, severe right C5-6 neuroforaminal stenosis. ? ?4/15-patient had a rapid response yesterday afternoon for acute onset of right flank pain.  Work-up showed pulmonary edema, lateral ST depression in the EKG and elevated troponin thought to be due to demand ischemia.  Patient was transferred to stepdown for close monitoring ? ?4/16: Heparin for 48 hours per cardiology conservative management.  To be stopped on 4/17.  Lokelma given for hyperkalemia today.  Nephro, cardio, PCCM following ? ?4/17: Dialysis today followed by administration.  Removal of HD catheter.  Permacath on Wednesday by vascular.  Heparin drip to be stopped later today.  1 PRBC transfusion for hemoglobin of 7 ? ? ?Assessment and Plan: ?* Right sided weakness ?Right-sided weakness not explained by MRI of the brain.  Neurology ordered MRI of the cervical spine and thoracic spine and an MRI of the brain. ? ? ?Acute CVA (cerebrovascular accident) ([name]) ?Continue aspirin Plavix and Lipitor.  LDL 87 ? ?Lobar pneumonia ([name]) ?On cefepime day 2/5.  Respiratory panel positive for parainfluenza virus. ? ?Acute kidney injury superimposed on chronic kidney disease ([name]) ?Acute kidney injury on chronic kidney disease stage IV.  Baseline creatinine around 3.19 as per care everywhere. ?Lab Results  ?Component Value Date  ? CREATININE 3.50 (H) 01/09/2022  ? CREATININE 2.95 (H) 01/08/2022  ? CREATININE 2.34  (H) 01/07/2022  ?Dialysis session today. Next one Wednesday  ? ?Hyperkalemia ?Status post Lokelma yesterday.  Potassium still 6.1.  Now getting dialysis today through temporary HD catheter ? ?Elevated troponin ?Due to demand ischemia.  EKG showed lateral ST depression.  Cardiology seen and recommends heparin for 48 hours.  To be stopped on 4/17 ? ?Weakness of right lower extremity ?Slowly improving.  No clear etiology.  Continue PT, OT ? ?Acute right flank pain ?Transient on 4/15 and now resolved.  CT abdomen pelvis not showing any acute pathology ? ?Anemia ?Hemoglobin dropped to 7.0.Marland Kitchen  1 PRBC transfusion today. Epogen per nephro ? ? ?Hypokalemia ?Potassium 3.2 on presentation.  Careful with replacement secondary to CKD stage IV. ? ?Type 2 diabetes mellitus without complications ([name]) ?Continue insulin Semglee.  NovoLog and sliding scale insulin.   ? ? ?HFrEF (heart failure with reduced ejection fraction) ([name]) ?Patient is euvolemic at this point.  With acute kidney injury holding diuretics.  Repeating echo with stroke. Last EF last year 30%. ? ?Essential (primary) hypertension ?Restart Toprol low-dose at night. ? ? ? ? ?  ? ?Subjective: agreeable with transfusion as Hb 7.0 and also HD, no new c/o ? ?Physical Exam: ?Vitals:  ? 01/09/22 1230 01/09/22 1245 01/09/22 1300 01/09/22 1315  ?BP: (!) 126/59 (!) 121/56 (!) 120/59 127/64  ?Pulse: (!) 102 (!) 102 98 (!) 102  ?Resp: 17 (!) '21 19 19  '$ ?Temp:   98.5 ?F (36.9 ?C)   ?TempSrc:   Oral   ?SpO2: 98% 100% 100% 99%  ?Weight:   82.5 kg   ?Height:      ? ?71 year old female lying in  the bed comfortably without any acute distress ?Lungs clear to auscultation by laterally no wheezing rales rhonchi crepitation ?Cardiovascular S1-S2 normal, no murmur or gallop ?Abdomen soft, benign ?Neuro alert and oriented ?Skin no rash or lesion ? ?Data Reviewed: ? ?K 6.1 ? ?Family Communication: Updated Grand daughter over phone ? ?Disposition: ?Status is: Inpatient ?Remains inpatient  appropriate because: needs HD ? ? Planned Discharge Destination: Home with Home Health ? ? ?DVT prophylaxis - SCDs ?Time spent: 35 minutes ? ?Author: ?Max Sane, MD ?01/09/2022 3:47 PM ? ?For on call review [email or web address].  ?

## 2022-01-09 NOTE — Assessment & Plan Note (Signed)
On cefepime day 2/5.  Respiratory panel positive for parainfluenza virus. ?

## 2022-01-09 NOTE — Assessment & Plan Note (Signed)
Due to demand ischemia.  EKG showed lateral ST depression.  Cardiology seen and recommends heparin for 48 hours.  To be stopped on 4/17 ?

## 2022-01-09 NOTE — Assessment & Plan Note (Signed)
Hemoglobin dropped to 7.0.Marland Kitchen  1 PRBC transfusion today. Epogen per nephro ? ?

## 2022-01-09 NOTE — Assessment & Plan Note (Signed)
Transient on 4/15 and now resolved.  CT abdomen pelvis not showing any acute pathology ?

## 2022-01-09 NOTE — Progress Notes (Addendum)
?[facility]  ?ROUNDING NOTE  ? ?Subjective:  ? ?Patient seen resting in bed ?Alert and oriented  ?Complains of constant hunger, reports drinking 3 Ensures daily with meals ?Denies nausea and vomiting ?Denies shortness of breath ?Also complaints of fatigue  ? ? ?Objective:  ?Vital signs in last 24 hours:  ?Temp:  [98.3 ?F (36.8 ?C)-99.6 ?F (37.6 ?C)] 98.7 ?F (37.1 ?C) (04/17 1148) ?Pulse Rate:  [101-118] 101 (04/17 1215) ?Resp:  [14-30] 14 (04/17 1215) ?BP: (115-158)/(53-85) 115/54 (04/17 1200) ?SpO2:  [93 %-100 %] 100 % (04/17 1215) ?Weight:  [83.5 kg] 83.5 kg (04/17 1009) ? ?Weight change:  ?Filed Weights  ? 01/05/22 1327 01/06/22 1557 01/09/22 1009  ?Weight: 81.3 kg 82.2 kg 83.5 kg  ? ? ?Intake/Output: ?I/O last 3 completed shifts: ?In: 2856.8 [P.O.:2290; I.V.:264.8; IV [id number]:662] ?Out: 275 [Urine:275] ?  ?Intake/Output this shift: ? Total I/O ?In: 550 [P.O.:240; Blood:310] ?Out: -  ? ?Physical Exam: ?General: NAD, sitting up in bed  ?Head: Normocephalic, atraumatic. Moist oral mucosal membranes  ?Eyes: Anicteric  ?Lungs:  Mild scattered rhonchi, otherwise normal effort  ?Heart: Regular rate and rhythm  ?Abdomen:  Soft, nontender  ?Extremities: No peripheral edema.  ?Neurologic: Nonfocal, moving all four extremities  ?Skin: No lesions  ?Access: Right femoral nontunneled hemodialysis catheter  ? ? ?Basic Metabolic Panel: ?Recent Labs  ?Lab 01/05/22 ?0313 01/06/22 ?0431 01/07/22 ?0651 01/07/22 ?1327 01/08/22 ?0159 01/09/22 ?9476 01/09/22 ?1034 01/09/22 ?1124  ?NA 131* 135 137 136 136 138  --   --   ?K 5.5* 5.1 4.9 5.3* 5.7* 5.9* 6.1* 4.6  ?CL 99 101 99 99 98 97*  --   --   ?CO2 '23 25 29 27 29 29  '$ --   --   ?GLUCOSE 251* 142* 59* 243* 172* 197*  --   --   ?BUN 80* 68* 42* 48* 62* 75*  --   --   ?CREATININE 3.65* 2.88* 2.11* 2.34* 2.95* 3.50*  --   --   ?CALCIUM 8.1* 8.3* 8.5* 8.1* 8.2* 8.5*  --   --   ?MG 1.6* 1.7  --   --  1.7 1.8  --   --   ?PHOS 5.0* 5.3*  5.3*  --   --  6.1* 5.8*  --   --    ? ? ? ?Liver Function Tests: ?Recent Labs  ?Lab 01/05/22 ?0313 01/06/22 ?0431 01/07/22 ?0651 01/07/22 ?1327 01/08/22 ?0159 01/09/22 ?5465  ?AST 94*  --  99* 155* 131*  --   ?ALT 152*  --  174* 192* 192*  --   ?ALKPHOS 152*  --  178* 227* 218*  --   ?BILITOT 0.5  --  0.5 0.7 0.4  --   ?PROT 6.2*  --  6.3* 6.5 6.2*  --   ?ALBUMIN 2.3* 2.4* 2.5* 2.5* 2.3* 2.3*  ? ? ?No results for input(s): LIPASE, AMYLASE in the last 168 hours. ?No results for input(s): AMMONIA in the last 168 hours. ? ?CBC: ?Recent Labs  ?Lab 01/06/22 ?1015 01/07/22 ?0651 01/07/22 ?1327 01/08/22 ?0159 01/09/22 ?0354  ?WBC 14.9* 20.4* 25.1* 15.0* 11.0*  ?HGB 6.5* 8.9* 8.7* 7.8* 7.0*  ?HCT 20.4* 27.4* 27.9* 24.9* 22.4*  ?MCV 84.6 83.3 86.1 85.6 87.5  ?PLT 276 265 253 235 238  ? ? ? ?Cardiac Enzymes: ?No results for input(s): CKTOTAL, CKMB, CKMBINDEX, TROPONINI in the last 168 hours. ? ?BNP: ?Invalid input(s): POCBNP ? ?CBG: ?Recent Labs  ?Lab 01/08/22 ?1117 01/08/22 ?1704 01/08/22 ?2118 01/09/22 ?0025  01/09/22 ?0258  ?GLUCAP 120* 231* 106* 188* 264*  ? ? ? ?Microbiology: ?Results for orders placed or performed during the hospital encounter of 12/26/21  ?Respiratory (~20 pathogens) panel by PCR     Status: Abnormal  ? Collection Time: 12/27/21 11:32 AM  ? Specimen: Nasopharyngeal Swab; Respiratory  ?Result Value Ref Range Status  ? Adenovirus NOT DETECTED NOT DETECTED Final  ? Coronavirus 229E NOT DETECTED NOT DETECTED Final  ?  Comment: (NOTE) ?The Coronavirus on the Respiratory Panel, DOES NOT test for the novel  ?Coronavirus (2019 nCoV) ?  ? Coronavirus HKU1 NOT DETECTED NOT DETECTED Final  ? Coronavirus NL63 NOT DETECTED NOT DETECTED Final  ? Coronavirus OC43 NOT DETECTED NOT DETECTED Final  ? Metapneumovirus NOT DETECTED NOT DETECTED Final  ? Rhinovirus / Enterovirus NOT DETECTED NOT DETECTED Final  ? Influenza A NOT DETECTED NOT DETECTED Final  ? Influenza B NOT DETECTED NOT DETECTED Final  ? Parainfluenza Virus 1 NOT DETECTED NOT DETECTED Final   ? Parainfluenza Virus 2 NOT DETECTED NOT DETECTED Final  ? Parainfluenza Virus 3 DETECTED (A) NOT DETECTED Final  ? Parainfluenza Virus 4 NOT DETECTED NOT DETECTED Final  ? Respiratory Syncytial Virus NOT DETECTED NOT DETECTED Final  ? Bordetella pertussis NOT DETECTED NOT DETECTED Final  ? Bordetella Parapertussis NOT DETECTED NOT DETECTED Final  ? Chlamydophila pneumoniae NOT DETECTED NOT DETECTED Final  ? Mycoplasma pneumoniae NOT DETECTED NOT DETECTED Final  ?  Comment: Performed at [facility], [place] [address], [place], [place] [address]  ?Resp Panel by RT-PCR (Flu A&B, Covid) Nasopharyngeal Swab     Status: None  ? Collection Time: 12/27/21 11:32 AM  ? Specimen: Nasopharyngeal Swab; Nasopharyngeal(NP) swabs in vial transport medium  ?Result Value Ref Range Status  ? SARS Coronavirus 2 by RT PCR NEGATIVE NEGATIVE Final  ?  Comment: (NOTE) ?SARS-CoV-2 target nucleic acids are NOT DETECTED. ? ?The SARS-CoV-2 RNA is generally detectable in upper respiratory ?specimens during the acute phase of infection. The lowest ?concentration of SARS-CoV-2 viral copies this assay can detect is ?138 copies/mL. A negative result does not preclude SARS-Cov-2 ?infection and should not be used as the sole basis for treatment or ?other patient management decisions. A negative result may occur with  ?improper specimen collection/handling, submission of specimen other ?than nasopharyngeal swab, presence of viral mutation(s) within the ?areas targeted by this assay, and inadequate number of viral ?copies(<138 copies/mL). A negative result must be combined with ?clinical observations, patient history, and epidemiological ?information. The expected result is Negative. ? ?Fact Sheet for Patients:  ?EntrepreneurPulse.com.au ? ?Fact Sheet for Healthcare Providers:  ?IncredibleEmployment.be ? ?This test is no t yet approved or cleared by the [place] FDA and  ?has been authorized for detection  and/or diagnosis of SARS-CoV-2 by ?FDA under an Emergency Use Authorization (EUA). This EUA will remain  ?in effect (meaning this test can be used) for the duration of the ?COVID-19 declaration under Section 564(b)(1) of the Act, 21 ?U.S.C.section 360bbb-3(b)(1), unless the authorization is terminated  ?or revoked sooner.  ? ? ?  ? Influenza A by PCR NEGATIVE NEGATIVE Final  ? Influenza B by PCR NEGATIVE NEGATIVE Final  ?  Comment: (NOTE) ?The Xpert Xpress SARS-CoV-2/FLU/RSV plus assay is intended as an aid ?in the diagnosis of influenza from Nasopharyngeal swab specimens and ?should not be used as a sole basis for treatment. Nasal washings and ?aspirates are unacceptable for Xpert Xpress SARS-CoV-2/FLU/RSV ?testing. ? ?Fact Sheet for Patients: ?EntrepreneurPulse.com.au ? ?  Fact Sheet for Healthcare Providers: ?IncredibleEmployment.be ? ?This test is not yet approved or cleared by the [place] FDA and ?has been authorized for detection and/or diagnosis of SARS-CoV-2 by ?FDA under an Emergency Use Authorization (EUA). This EUA will remain ?in effect (meaning this test can be used) for the duration of the ?COVID-19 declaration under Section 564(b)(1) of the Act, 21 U.S.C. ?section 360bbb-3(b)(1), unless the authorization is terminated or ?revoked. ? ?Performed at [facility], [place], ?[place] [address] ?  ?MRSA Next Gen by PCR, Nasal     Status: None  ? Collection Time: 01/07/22  1:34 PM  ? Specimen: Nasal Mucosa; Nasal Swab  ?Result Value Ref Range Status  ? MRSA by PCR Next Gen NOT DETECTED NOT DETECTED Final  ?  Comment: (NOTE) ?The GeneXpert MRSA Assay (FDA approved for NASAL specimens only), ?is one component of a comprehensive MRSA colonization surveillance ?program. It is not intended to diagnose MRSA infection nor to guide ?or monitor treatment for MRSA infections. ?Test performance is not FDA approved in patients less than 2  years ?old. ?Performed at [facility], [place], ?[place] [address] ?  ?Culture, blood (Routine X 2) w Reflex to ID Panel     Status: None (Preliminary result)  ? Collection Time: 01/07/22  5:51 PM

## 2022-01-10 ENCOUNTER — Inpatient Hospital Stay: Payer: Medicare HMO

## 2022-01-10 ENCOUNTER — Encounter
Admission: EM | Disposition: A | Discharge status: Home Health | Payer: Self-pay | Source: Home / Self Care | Attending: Internal Medicine

## 2022-01-10 HISTORY — PX: DIALYSIS/PERMA CATHETER INSERTION: CATH118288

## 2022-01-10 LAB — BPAM RBC
Blood Product Expiration Date: 202305012359
Blood Product Expiration Date: 202305012359
ISSUE DATE / TIME: 202304141619
ISSUE DATE / TIME: 202304171042
Unit Type and Rh: 600
Unit Type and Rh: 600

## 2022-01-10 LAB — RENAL FUNCTION PANEL
Albumin: 2.3 g/dL — ABNORMAL LOW (ref 3.5–5.0)
Anion gap: 9 (ref 5–15)
BUN: 56 mg/dL — ABNORMAL HIGH (ref 8–23)
CO2: 27 mmol/L (ref 22–32)
Calcium: 8.4 mg/dL — ABNORMAL LOW (ref 8.9–10.3)
Chloride: 99 mmol/L (ref 98–111)
Creatinine, Ser: 2.98 mg/dL — ABNORMAL HIGH (ref 0.44–1.00)
GFR, Estimated: 16 mL/min — ABNORMAL LOW (ref >60)
Glucose, Bld: 258 mg/dL — ABNORMAL HIGH (ref 70–99)
Phosphorus: 5 mg/dL — ABNORMAL HIGH (ref 2.5–4.6)
Potassium: 5.1 mmol/L (ref 3.5–5.1)
Sodium: 135 mmol/L (ref 135–145)

## 2022-01-10 LAB — GLUCOSE, CAPILLARY
Glucose-Capillary: 80 mg/dL (ref 70–99)
Glucose-Capillary: 119 mg/dL — ABNORMAL HIGH (ref 70–99)
Glucose-Capillary: 105 mg/dL — ABNORMAL HIGH (ref 70–99)
Glucose-Capillary: 263 mg/dL — ABNORMAL HIGH (ref 70–99)
Glucose-Capillary: 97 mg/dL (ref 70–99)
Glucose-Capillary: 173 mg/dL — ABNORMAL HIGH (ref 70–99)

## 2022-01-10 LAB — TYPE AND SCREEN
ABO/RH(D): A NEG
Antibody Screen: NEGATIVE
Unit division: 0
Unit division: 0

## 2022-01-10 LAB — CBC
HCT: 26.3 % — ABNORMAL LOW (ref 36.0–46.0)
Hemoglobin: 8.1 g/dL — ABNORMAL LOW (ref 12.0–15.0)
MCH: 26.6 pg (ref 26.0–34.0)
MCHC: 30.8 g/dL (ref 30.0–36.0)
MCV: 86.2 fL (ref 80.0–100.0)
Platelets: 268 10*3/uL (ref 150–400)
RBC: 3.05 MIL/uL — ABNORMAL LOW (ref 3.87–5.11)
RDW: 15.4 % (ref 11.5–15.5)
WBC: 8.2 10*3/uL (ref 4.0–10.5)
nRBC: 0 % (ref 0.0–0.2)

## 2022-01-10 SURGERY — DIALYSIS/PERMA CATHETER INSERTION
Anesthesia: Moderate Sedation | Laterality: Right

## 2022-01-10 MED ORDER — SODIUM CHLORIDE 0.9 % IV SOLN: INTRAVENOUS | Status: DC

## 2022-01-10 MED ORDER — SODIUM CHLORIDE 0.9 % IV SOLN
INTRAVENOUS | Status: DC | PRN
Start: 2022-01-10 — End: 2022-01-13

## 2022-01-10 MED ORDER — DEXTROSE 50 % IV SOLN
25.0000 mL | Freq: Once | INTRAVENOUS | Status: AC
  Administered 2022-01-10: 25 mL via INTRAVENOUS
  Filled 2022-01-10: qty 50

## 2022-01-10 MED ORDER — FENTANYL CITRATE PF 50 MCG/ML IJ SOSY
PREFILLED_SYRINGE | INTRAMUSCULAR | Status: AC
  Filled 2022-01-10: qty 1

## 2022-01-10 MED ORDER — SODIUM CHLORIDE FLUSH 0.9 % IV SOLN
INTRAVENOUS | Status: AC
  Filled 2022-01-10: qty 20

## 2022-01-10 MED ORDER — VANCOMYCIN HCL IN DEXTROSE 1-5 GM/200ML-% IV SOLN
1000.0000 mg | INTRAVENOUS | Status: AC
  Administered 2022-01-10: 1000 mg via INTRAVENOUS
  Filled 2022-01-10: qty 200

## 2022-01-10 MED ORDER — FENTANYL CITRATE (PF) 100 MCG/2ML IJ SOLN
INTRAMUSCULAR | Status: DC | PRN
  Administered 2022-01-10 (×2): 25 ug via INTRAVENOUS

## 2022-01-10 MED ORDER — MIDAZOLAM HCL 2 MG/2ML IJ SOLN
INTRAMUSCULAR | Status: DC | PRN
  Administered 2022-01-10 (×2): 1 mg via INTRAVENOUS

## 2022-01-10 MED ORDER — MIDAZOLAM HCL 2 MG/2ML IJ SOLN
INTRAMUSCULAR | Status: AC
  Filled 2022-01-10: qty 2

## 2022-01-10 SURGICAL SUPPLY — 10 items
ADH SKN CLS APL DERMABOND .7 (GAUZE/BANDAGES/DRESSINGS) ×1
BIOPATCH RED 1 DISK 7.0 (GAUZE/BANDAGES/DRESSINGS) ×1 IMPLANT
CATH PALINDROME-P 19CM W/VT (CATHETERS) ×1 IMPLANT
COVER PROBE U/S 5X48 (MISCELLANEOUS) ×1 IMPLANT
DERMABOND ADVANCED (GAUZE/BANDAGES/DRESSINGS) ×1
DERMABOND ADVANCED .7 DNX12 (GAUZE/BANDAGES/DRESSINGS) IMPLANT
PACK ANGIOGRAPHY (CUSTOM PROCEDURE TRAY) ×1 IMPLANT
SUT MNCRL AB 4-0 PS2 18 (SUTURE) ×1 IMPLANT
SUT PROLENE 0 CT 1 30 (SUTURE) ×1 IMPLANT
TOWEL OR 17X26 4PK STRL BLUE (TOWEL DISPOSABLE) ×1 IMPLANT

## 2022-01-10 NOTE — Progress Notes (Signed)
[facility] Cardiology ? ?CARDIOLOGY CONSULT NOTE  ?Patient ID: ?Deanna [name] ?MRN: [id number] ?DOB/AGE: [birth year] 71 y.o. ? ?Admit date: 12/26/2021 ?Referring Physician Costin Ghergehe ?Primary [facility]ician [facility], [place] ?Primary Cardiologist Baxter Hire, MD ?Reason for Consultation Elevated troponin ? ?HPI:  ?Deanna [name] is a 71 year old female with CKD 5, HFrEF with EF 30%), CAD (CTO RCA, moderate disease elsewhere by cath 12/15/2021), type 2 diabetes, hypertension, hyperlipidemia who was admitted to the hospital with progressive renal failure.  Cardiology is consulted for an elevated troponin in the setting of severe right-sided flank pain, and elevated white blood cell count possibly due to infectious source. ? ?Interval history: ?- had permacath placement this morning, complaining of some soreness in the the right side of her neck ?-Feels fatigued today, questions why.  Denies chest pain, shortness of breath, palpitations. ? ?Review of systems complete and found to be negative unless listed above  ? ? ? ?Past Medical History:  ?Diagnosis Date  ? Coronary artery disease   ? Hypertension   ?  ?Past Surgical History:  ?Procedure Laterality Date  ? CARDIAC CATHETERIZATION Left 03/01/2016  ? Procedure: Left Heart Cath and Coronary Angiography;  Surgeon: Teodoro Spray, MD;  Location: [facility];  Service: Cardiovascular;  Laterality: Left;  ? TEMPORARY DIALYSIS CATHETER N/A 01/04/2022  ? Procedure: TEMPORARY DIALYSIS CATHETER;  Surgeon: Algernon Huxley, MD;  Location: [facility];  Service: Cardiovascular;  Laterality: N/A;  ?  ?Medications Prior to Admission  ?Medication Sig Dispense Refill Last Dose  ? aspirin EC 81 MG tablet Take 81 mg by mouth daily.   12/25/2021  ? atorvastatin (LIPITOR) 40 MG tablet Take 40 mg by mouth daily.   12/24/2021  ? carvedilol (COREG) 12.5 MG tablet Take 12.5 mg by mouth 2 (two) times daily with a meal.   4/2  ? gabapentin (NEURONTIN) 100 MG capsule Take 300 mg  by mouth.   12/24/2021  ? insulin glargine (LANTUS) 100 UNIT/ML injection Inject 65 Units into the skin at bedtime.   12/24/2021 at 2300  ? sacubitril-valsartan (ENTRESTO) 24-26 MG Take 1 tablet by mouth 2 (two) times daily.   4/2  ? empagliflozin (JARDIANCE) 10 MG TABS tablet Take 10 mg by mouth daily. (Patient not taking: Reported on 01/09/2022)   Not Taking  ? metFORMIN (GLUCOPHAGE) 500 MG tablet Take 500 mg by mouth 2 (two) times daily with a meal. (Patient not taking: Reported on 12/26/2021)   Not Taking  ? ?Social History  ? ?Socioeconomic History  ? Marital status: Single  ?  Spouse name: Not on file  ? Number of children: Not on file  ? Years of education: Not on file  ? Highest education level: Not on file  ?Occupational History  ? Not on file  ?Tobacco Use  ? Smoking status: Never  ? Smokeless tobacco: Not on file  ?Substance and Sexual Activity  ? Alcohol use: No  ? Drug use: No  ? Sexual activity: Not on file  ?Other Topics Concern  ? Not on file  ?Social History Narrative  ? Not on file  ? ?Social Determinants of Health  ? ?Financial Resource Strain: Not on file  ?Food Insecurity: Not on file  ?Transportation Needs: Not on file  ?Physical Activity: Not on file  ?Stress: Not on file  ?Social Connections: Not on file  ?Intimate Partner Violence: Not on file  ?  ?History reviewed. No pertinent family history.  ? ?PHYSICAL EXAM ? ?General: Elderly  black female, Well developed, well nourished, in no acute distress. Sitting at incline in hospital bed.  ?HEENT:  Normocephalic and atraumatic ?Neck:  No JVD.  ?Lungs: Normal respiratory effort on room air. Clear to ascultation. ?Heart: Tachycardic but regular . Normal S1 and S2 without gallops or murmurs.  ?Abdomen: non-distended appearing.  ?Msk: Normal strength and tone for age. ?Extremities: No clubbing, cyanosis. Trace bilateral ankle edema.  ?Neuro: Alert and oriented X 3. ?Psych:  Good affect, responds appropriately ? ?Labs: ?  ?Lab Results  ?Component Value Date   ? WBC 11.0 (H) 01/09/2022  ? HGB 7.0 (L) 01/09/2022  ? HCT 22.4 (L) 01/09/2022  ? MCV 87.5 01/09/2022  ? PLT 238 01/09/2022  ?  ?Recent Labs  ?Lab 01/08/22 ?0159 01/09/22 ?0086 01/09/22 ?1034 01/09/22 ?1124  ?NA 136 138  --   --   ?K 5.7* 5.9*   < > 4.6  ?CL 98 97*  --   --   ?CO2 29 29  --   --   ?BUN 62* 75*  --   --   ?CREATININE 2.95* 3.50*  --   --   ?CALCIUM 8.2* 8.5*  --   --   ?PROT 6.2*  --   --   --   ?BILITOT 0.4  --   --   --   ?ALKPHOS 218*  --   --   --   ?ALT 192*  --   --   --   ?AST 131*  --   --   --   ?GLUCOSE 172* 197*  --   --   ? < > = values in this interval not displayed.  ? ? ?Lab Results  ?Component Value Date  ? TROPONINI <0.03 12/09/2017  ? ?  ?Lab Results  ?Component Value Date  ? CHOL 182 12/27/2021  ? ?Lab Results  ?Component Value Date  ? HDL 80 12/27/2021  ? ?Lab Results  ?Component Value Date  ? [place] 87 12/27/2021  ? ?Lab Results  ?Component Value Date  ? TRIG 77 12/27/2021  ? ?Lab Results  ?Component Value Date  ? CHOLHDL 2.3 12/27/2021  ? ?No results found for: LDLDIRECT  ?  ?Radiology: CT HEAD WO CONTRAST ? ?Result Date: 12/26/2021 ?CLINICAL DATA:  Intermittent numbness, weakness, and dizziness. Unable to lift right arm. EXAM: CT HEAD WITHOUT CONTRAST TECHNIQUE: Contiguous axial images were obtained from the base of the skull through the vertex without intravenous contrast. RADIATION DOSE REDUCTION: This exam was performed according to the departmental dose-optimization program which includes automated exposure control, adjustment of the mA and/or kV according to patient size and/or use of iterative reconstruction technique. COMPARISON:  None. FINDINGS: Brain: No evidence of acute infarction, hemorrhage, hydrocephalus, extra-axial collection or mass lesion/mass effect. Cerebral volume within normal limits for age. Scattered mild periventricular and subcortical white matter hypodensities are nonspecific, but favored to reflect chronic microvascular ischemic changes. Vascular:  Atherosclerotic vascular calcification of the carotid siphons. No hyperdense vessel. Skull: Normal. Negative for fracture or focal lesion. Sinuses/Orbits: No acute finding. Scattered mild paranasal sinus mucosal thickening. The orbits are unremarkable. Other: None. IMPRESSION: 1. No acute intracranial abnormality. 2. Mild chronic microvascular ischemic changes. Electronically Signed   By: Titus Dubin M.D.   On: 12/26/2021 16:52  ? ?MR ANGIO HEAD WO CONTRAST ? ?Result Date: 12/28/2021 ?CLINICAL DATA:  Stroke follow-up EXAM: MRA HEAD WITHOUT CONTRAST TECHNIQUE: Angiographic images of the Circle of Willis were acquired using MRA technique without intravenous contrast. COMPARISON:  No pertinent prior exam. FINDINGS: POSTERIOR CIRCULATION: --Vertebral arteries: Normal --Inferior cerebellar arteries: Normal. --Basilar artery: Normal. --Superior cerebellar arteries: Normal. --Posterior cerebral arteries: Normal. ANTERIOR CIRCULATION: --Intracranial internal carotid arteries: Normal. --Anterior cerebral arteries (ACA): Normal. --Middle cerebral arteries (MCA): Normal. ANATOMIC VARIANTS: None Other: None. IMPRESSION: Normal intracranial MRA. Electronically Signed   By: Ulyses Jarred M.D.   On: 12/28/2021 02:10  ? ?MR Brain Wo Contrast (neuro protocol) ? ?Result Date: 12/26/2021 ?CLINICAL DATA:  Acute neurologic deficit EXAM: MRI HEAD WITHOUT CONTRAST TECHNIQUE: Multiplanar, multiecho pulse sequences of the brain and surrounding structures were obtained without intravenous contrast. COMPARISON:  None. FINDINGS: Brain: Punctate focus of reduced diffusion within the superior right pericallosal white matter. No other diffusion abnormality. No acute or chronic hemorrhage. Hyperintense T2-weighted signal is moderately widespread throughout the white matter. Generalized volume loss without a clear lobar predilection. The midline structures are normal. Vascular: Major flow voids are preserved. Skull and upper cervical spine: Normal  calvarium and skull base. Visualized upper cervical spine and soft tissues are normal. Sinuses/Orbits:Bilateral mastoid fluid.  Normal orbits. IMPRESSION: 1. Punctate focus of reduced diffusion within

## 2022-01-10 NOTE — Progress Notes (Addendum)
Nutrition Follow-up ? ?DOCUMENTATION CODES:  ? ?Not applicable ? ?INTERVENTION:  ? ?-Continue Nepro Shake po daily, each supplement provides 425 kcal and 19 grams protein  ?-Renal MVI ?-Double protein portions with meals ?-Liberalize diet to carb modified ? ?NUTRITION DIAGNOSIS:  ? ?Inadequate oral intake related to poor appetite, early satiety as evidenced by per patient/family report. ? ?Ongoing ? ?GOAL:  ? ?Patient will meet greater than or equal to 90% of their needs ? ?Progressing  ? ?MONITOR:  ? ?PO intake, Supplement acceptance, Diet advancement, Labs, Weight trends ? ?REASON FOR ASSESSMENT:  ? ?Consult ?Assessment of nutrition requirement/status ? ?ASSESSMENT:  ? ?Pt admitted from home with R sided weakness, admitted to r/o stroke. PMH significant for HTN and CAD with recent stents. ? ?4/12- s/p PROCEDURE: ?Ultrasound guidance for vascular access right femoral vein ?Placement of a 30 cm triple-lumen dialysis catheter right femoral vein ?  ?4/18- s/p PROCEDURE: ?Ultrasound guidance for vascular access to the right internal jugular vein ?Fluoroscopic guidance for placement of catheter ?Placement of a 19 cm tip to cuff tunneled hemodialysis catheter via the right internal jugular vein ? ?Reviewed I/O's: +149 ml x 24 hours and +6.7 ml since admission  ? ?Per nephrology notes, pt with minimal improvements with renal function due to HD and will require HD at discharge.  ? ?Pt sleeping soundly at time of visit. Pt did not respond to voice.  ? ?Noted good meal completions 75-100%. Pt is consuming Nepro supplements. Per MD notes, pt complaining of excess hunger; RD will liberalize diet to wider variety of meal selections.  ? ?Medications reviewed and include lokelma.  ? ?Labs reviewed: CBGS: 80-263 (inpatient orders for glycemic control are 0-20 units insulin aspart TID with meals, 0-5 units insulin aspart daily at bedtime, 4 units insulin aspart TID with meals, and 35 units insulin glargine-yfgn daily).   ? ?Diet  Order:   ?Diet Order   ? ?       ?  Diet heart healthy/carb modified Room service appropriate? Yes; Fluid consistency: Thin  Diet effective now       ?  ? ?  ?  ? ?  ? ? ?EDUCATION NEEDS:  ? ?No education needs have been identified at this time ? ?Skin:  Skin Assessment: Reviewed RN Assessment ? ?Last BM:  01/09/22 ? ?Height:  ? ?Ht Readings from Last 1 Encounters:  ?01/10/22 '6\' 1"'$  (1.854 m)  ? ? ?Weight:  ? ?Wt Readings from Last 1 Encounters:  ?01/09/22 82.5 kg  ? ?BMI:  Body mass index is 24 kg/m?. ? ?Estimated Nutritional Needs:  ? ?Kcal:  1850-2050 ? ?Protein:  100-115 grams ? ?Fluid:  1000 ml + UOP ? ? ? ?Loistine Chance, RD, LDN, CDCES ?Registered Dietitian II ?Certified Diabetes Care and Education Specialist ?Please refer to [facility] for RD and/or RD on-call/weekend/after hours pager  ?

## 2022-01-10 NOTE — Progress Notes (Signed)
?   01/06/22 1645 01/06/22 1700 01/06/22 1715  ?Vitals  ?Temp  --   --   --   ?Temp Source  --   --   --   ?BP 131/65 134/68 134/69  ?MAP (mmHg) 85 88 89  ?Pulse Rate 95 95 94  ?ECG Heart Rate 96 95 94  ?Resp (!) 22 (!) 21 (!) 21  ?During Hemodialysis Assessment  ?Blood Flow Rate (mL/min) 300 mL/min 300 mL/min 300 mL/min  ?Arterial Pressure (mmHg) -110 mmHg -110 mmHg -120 mmHg  ?Venous Pressure (mmHg) 100 mmHg 100 mmHg 100 mmHg  ?Transmembrane Pressure (mmHg) 60 mmHg 60 mmHg 60 mmHg  ?Ultrafiltration Rate (mL/min) 550 mL/min 550 mL/min 550 mL/min  ?Dialysate Flow Rate (mL/min) 600 ml/min 600 ml/min 600 ml/min  ?Conductivity: Machine  13.9 13.9 13.9  ?HD Safety Checks Performed Yes Yes Yes  ?Intra-Hemodialysis Comments Progressing as prescribed Progressing as prescribed Progressing as prescribed  ? ? 01/06/22 1730 01/06/22 1745 01/06/22 1800  ?Vitals  ?Temp  --   --  98.5 ?F (36.9 ?C)  ?Temp Source  --   --  Oral  ?BP 135/70 137/71 (!) 142/72  ?MAP (mmHg) 90 91 93  ?Pulse Rate 94 92 92  ?ECG Heart Rate 92 92 91  ?Resp 20 20 (!) 22  ?During Hemodialysis Assessment  ?Blood Flow Rate (mL/min) 300 mL/min 300 mL/min 300 mL/min  ?Arterial Pressure (mmHg) -120 mmHg -120 mmHg -130 mmHg  ?Venous Pressure (mmHg) 100 mmHg 100 mmHg 110 mmHg  ?Transmembrane Pressure (mmHg) 60 mmHg 60 mmHg 60 mmHg  ?Ultrafiltration Rate (mL/min) 550 mL/min 550 mL/min 550 mL/min  ?Dialysate Flow Rate (mL/min) 600 ml/min 600 ml/min 600 ml/min  ?Conductivity: Machine  13.9 13.9 13.9  ?HD Safety Checks Performed Yes Yes Yes  ?Intra-Hemodialysis Comments Progressing as prescribed Progressing as prescribed Progressing as prescribed  ? ?

## 2022-01-10 NOTE — Plan of Care (Signed)
?  Problem: Education: ?Goal: Knowledge of General Education information will improve ?Description: Including pain rating scale, medication(s)/side effects and non-pharmacologic comfort measures ?Outcome: Progressing ?  ?Problem: Clinical Measurements: ?Goal: Ability to maintain clinical measurements within normal limits will improve ?Outcome: Progressing ?Goal: Respiratory complications will improve ?Outcome: Progressing ?  ?Problem: Activity: ?Goal: Risk for activity intolerance will decrease ?Outcome: Progressing ?  ?Problem: Nutrition: ?Goal: Adequate nutrition will be maintained ?Outcome: Progressing ?  ?Problem: Coping: ?Goal: Level of anxiety will decrease ?Outcome: Progressing ?  ?Problem: Pain Managment: ?Goal: General experience of comfort will improve ?Outcome: Progressing ?  ?Problem: Safety: ?Goal: Ability to remain free from injury will improve ?Outcome: Progressing ?  ?Problem: Skin Integrity: ?Goal: Risk for impaired skin integrity will decrease ?Outcome: Progressing ?  ?

## 2022-01-10 NOTE — Progress Notes (Signed)
?   01/09/22 1025 01/09/22 1030 01/09/22 1045  ?Vitals  ?Temp  --   --   --   ?Temp Source  --   --   --   ?BP  --  137/69  --   ?MAP (mmHg)  --  88 78  ?Pulse Rate  --  (!) 107 (!) 107  ?ECG Heart Rate  --  (!) 107 (!) 108  ?Resp  --  (!) 23 18  ?During Hemodialysis Assessment  ?Blood Flow Rate (mL/min) 300 mL/min 300 mL/min 300 mL/min  ?Arterial Pressure (mmHg) -110 mmHg -110 mmHg -110 mmHg  ?Venous Pressure (mmHg) 130 mmHg 130 mmHg 130 mmHg  ?Transmembrane Pressure (mmHg) 60 mmHg 60 mmHg 60 mmHg  ?Ultrafiltration Rate (mL/min) 400 mL/min 400 mL/min 400 mL/min  ?Dialysate Flow Rate (mL/min) 600 ml/min 600 ml/min 600 ml/min  ?Conductivity: Machine  13.7 13.7 13.7  ?HD Safety Checks Performed Yes Yes Yes  ?Dialysis Fluid Bolus Normal Saline  --   --   ?Bolus Amount (mL) 250 mL  --   --   ?Intra-Hemodialysis Comments Tx initiated Progressing as prescribed Progressing as prescribed  ? ? 01/09/22 1047 01/09/22 1100 01/09/22 1102  ?Vitals  ?Temp 99.1 ?F (37.3 ?C)  --  98.6 ?F (37 ?C)  ?Temp Source  --   --  Oral  ?BP 123/62  --  (!) 118/53  ?MAP (mmHg) 78 75 75  ?Pulse Rate (!) 107 (!) 108 (!) 110  ?ECG Heart Rate (!) 108 (!) 108 (!) 110  ?Resp 20 (!) 22 (!) 23  ?During Hemodialysis Assessment  ?Blood Flow Rate (mL/min)  --  300 mL/min 300 mL/min  ?Arterial Pressure (mmHg)  --  -130 mmHg -130 mmHg  ?Venous Pressure (mmHg)  --  140 mmHg 140 mmHg  ?Transmembrane Pressure (mmHg)  --  70 mmHg 70 mmHg  ?Ultrafiltration Rate (mL/min)  --  550 mL/min 550 mL/min  ?Dialysate Flow Rate (mL/min)  --  600 ml/min 600 ml/min  ?Conductivity: Machine   --  13.7 13.7  ?HD Safety Checks Performed  --  Yes Yes  ?Dialysis Fluid Bolus  --   --   --   ?Bolus Amount (mL)  --   --   --   ?Intra-Hemodialysis Comments  --  Progressing as prescribed Progressing as prescribed  ? ?

## 2022-01-10 NOTE — Progress Notes (Signed)
?   01/06/22 1815 01/06/22 1830 01/06/22 1845  ?Vitals  ?BP (!) 144/75 136/67 135/64  ?MAP (mmHg) 94 88 86  ?Pulse Rate 93 87 86  ?ECG Heart Rate 91 87 87  ?Resp 20 (!) 21 20  ?During Hemodialysis Assessment  ?Blood Flow Rate (mL/min) 300 mL/min 300 mL/min 300 mL/min  ?Arterial Pressure (mmHg) -130 mmHg -130 mmHg -130 mmHg  ?Venous Pressure (mmHg) 120 mmHg 120 mmHg 120 mmHg  ?Transmembrane Pressure (mmHg) 60 mmHg 60 mmHg 60 mmHg  ?Ultrafiltration Rate (mL/min) 550 mL/min 550 mL/min 550 mL/min  ?Dialysate Flow Rate (mL/min) 600 ml/min 600 ml/min 600 ml/min  ?Conductivity: Machine  13.9 13.9 13.8  ?HD Safety Checks Performed Yes Yes Yes  ?Intra-Hemodialysis Comments Progressing as prescribed Progressing as prescribed Progressing as prescribed  ? ? 01/06/22 1900 01/06/22 1902  ?Vitals  ?BP 132/64  --   ?MAP (mmHg) 84  --   ?Pulse Rate 88  --   ?ECG Heart Rate 88  --   ?Resp 19  --   ?During Hemodialysis Assessment  ?Blood Flow Rate (mL/min) 300 mL/min  --   ?Arterial Pressure (mmHg) -130 mmHg  --   ?Venous Pressure (mmHg) 100 mmHg  --   ?Transmembrane Pressure (mmHg) 60 mmHg  --   ?Ultrafiltration Rate (mL/min) 550 mL/min  --   ?Dialysate Flow Rate (mL/min) 600 ml/min  --   ?Conductivity: Machine  13.8  --   ?HD Safety Checks Performed Yes  --   ?Intra-Hemodialysis Comments Progressing as prescribed Tx completed  ? ?

## 2022-01-10 NOTE — Progress Notes (Signed)
? 01/09/22 1115 01/09/22 1130 01/09/22 1145  ?Vitals  ?Temp  --   --   --   ?Temp Source  --   --   --   ?BP (!) 117/57 (!) 117/54 (!) 120/58  ?MAP (mmHg) 74 73 77  ?Pulse Rate (!) 104 (!) 104 (!) 102  ?ECG Heart Rate (!) 105 (!) 104 (!) 104  ?Resp '16 18 18  '$ ?During Hemodialysis Assessment  ?Blood Flow Rate (mL/min) 300 mL/min 300 mL/min 300 mL/min  ?Arterial Pressure (mmHg) -140 mmHg -140 mmHg -140 mmHg  ?Venous Pressure (mmHg) 140 mmHg 140 mmHg 140 mmHg  ?Transmembrane Pressure (mmHg) 60 mmHg 60 mmHg 60 mmHg  ?Ultrafiltration Rate (mL/min) 550 mL/min 550 mL/min 550 mL/min  ?Dialysate Flow Rate (mL/min) 600 ml/min 600 ml/min 600 ml/min  ?Conductivity: Machine  13.7 13.7 13.7  ?HD Safety Checks Performed Yes Yes Yes  ?Dialysate Change  --   --   --   ?Intra-Hemodialysis Comments Progressing as prescribed Progressing as prescribed Progressing as prescribed  ? ? 01/09/22 1148 01/09/22 1152 01/09/22 1200  ?Vitals  ?Temp 98.7 ?F (37.1 ?C)  --   --   ?Temp Source Oral  --   --   ?BP  --  (!) 116/57 (!) 115/54  ?MAP (mmHg)  --  76 73  ?Pulse Rate (!) 102 (!) 102 (!) 101  ?ECG Heart Rate (!) 101 (!) 102 100  ?Resp '14 17 15  '$ ?During Hemodialysis Assessment  ?Blood Flow Rate (mL/min)  --   --  300 mL/min  ?Arterial Pressure (mmHg)  --   --  -140 mmHg  ?Venous Pressure (mmHg)  --   --  140 mmHg  ?Transmembrane Pressure (mmHg)  --   --  60 mmHg  ?Ultrafiltration Rate (mL/min)  --   --  550 mL/min  ?Dialysate Flow Rate (mL/min)  --   --  600 ml/min  ?Conductivity: Machine   --   --  13.7  ?HD Safety Checks Performed  --   --  Yes  ?Dialysate Change  --   --   --   ?Intra-Hemodialysis Comments  --   --  Progressing as prescribed  ? ? 01/09/22 1215 01/09/22 1230 01/09/22 1245  ?Vitals  ?Temp  --   --   --   ?Temp Source  --   --   --   ?BP (!) 123/56 (!) 126/59 (!) 121/56  ?MAP (mmHg) 75 77 74  ?Pulse Rate (!) 101 (!) 102 (!) 102  ?ECG Heart Rate (!) 101 (!) 101 (!) 102  ?Resp 14 17 (!) 21  ?During Hemodialysis Assessment   ?Blood Flow Rate (mL/min) 300 mL/min 300 mL/min 300 mL/min  ?Arterial Pressure (mmHg) -150 mmHg -150 mmHg -150 mmHg  ?Venous Pressure (mmHg) 140 mmHg 140 mmHg 140 mmHg  ?Transmembrane Pressure (mmHg) 60 mmHg 60 mmHg 60 mmHg  ?Ultrafiltration Rate (mL/min) 550 mL/min 550 mL/min 550 mL/min  ?Dialysate Flow Rate (mL/min) 600 ml/min 600 ml/min 600 ml/min  ?Conductivity: Machine  13.7 13.7 13.7  ?HD Safety Checks Performed Yes Yes Yes  ?Dialysate Change 2K  --   --   ?Intra-Hemodialysis Comments Progressing as prescribed Progressing as prescribed Progressing as prescribed  ? ? 01/09/22 1257  ?Vitals  ?Temp  --   ?Temp Source  --   ?BP  --   ?MAP (mmHg)  --   ?Pulse Rate  --   ?ECG Heart Rate  --   ?Resp  --   ?During Hemodialysis  Assessment  ?Blood Flow Rate (mL/min)  --   ?Arterial Pressure (mmHg)  --   ?Venous Pressure (mmHg)  --   ?Transmembrane Pressure (mmHg)  --   ?Ultrafiltration Rate (mL/min)  --   ?Dialysate Flow Rate (mL/min)  --   ?Conductivity: Machine   --   ?HD Safety Checks Performed  --   ?Dialysate Change  --   ?Intra-Hemodialysis Comments Tx completed  ? ?

## 2022-01-10 NOTE — Progress Notes (Signed)
?   01/05/22 1200 01/05/22 1215 01/05/22 1230  ?Vitals  ?BP (!) 119/51 (!) 111/50 (!) 103/49  ?MAP (mmHg) 71 68 (!) 61  ?Pulse Rate 84 83 85  ?ECG Heart Rate 84 83 84  ?Resp (!) 21 (!) 21 (!) 24  ?During Hemodialysis Assessment  ?Blood Flow Rate (mL/min) 250 mL/min 250 mL/min 250 mL/min  ?Arterial Pressure (mmHg) -100 mmHg -100 mmHg -100 mmHg  ?Venous Pressure (mmHg) 90 mmHg 90 mmHg 90 mmHg  ?Transmembrane Pressure (mmHg) 30 mmHg 30 mmHg 30 mmHg  ?Ultrafiltration Rate (mL/min) 200 mL/min 200 mL/min 200 mL/min  ?Dialysate Flow Rate (mL/min) 300 ml/min 300 ml/min 300 ml/min  ?Conductivity: Machine  13.7 13.7 13.7  ?HD Safety Checks Performed Yes Yes Yes  ?Intra-Hemodialysis Comments Progressing as prescribed Progressing as prescribed Progressing as prescribed  ? ? 01/05/22 1245 01/05/22 1300  ?Vitals  ?BP (!) 106/43 (!) 129/59  ?MAP (mmHg) (!) 58 79  ?Pulse Rate 88 86  ?ECG Heart Rate 89 87  ?Resp 17 20  ?During Hemodialysis Assessment  ?Blood Flow Rate (mL/min) 250 mL/min  --   ?Arterial Pressure (mmHg) -100 mmHg  --   ?Venous Pressure (mmHg) 90 mmHg  --   ?Transmembrane Pressure (mmHg) 20 mmHg  --   ?Ultrafiltration Rate (mL/min) 200 mL/min  --   ?Dialysate Flow Rate (mL/min) 300 ml/min  --   ?Conductivity: Machine  13.7  --   ?HD Safety Checks Performed Yes  --   ?Intra-Hemodialysis Comments Progressing as prescribed Tx completed  ? ?

## 2022-01-10 NOTE — Plan of Care (Signed)
PT has not been able to work w/pt since 4/4 when they recommended [facility].  Either pt refused or had medical situations that precluded PT wking w/her.  In getting pt up to [facility] her deconditioning is very evident.  One person can barely do it safely.  PT really needs to reevaluate because I don't think Home w/HH is a safe option any longer and she would benefit from rehab.  ?

## 2022-01-10 NOTE — H&P (Signed)
[facility] ?History & Physical Update ? ?The patient was interviewed and re-examined.  Patient with chronic kidney disease stage IV and admitted for acute renal failure.  We evaluated her on 4/12 with a full consult at that time we placed a temporary hemodialysis catheter.  At this juncture she will need long-term hemodialysis requiring a tunneled hemodialysis catheter.  The patient's previous History and Physical has been reviewed and is unchanged.  There is no change in the plan of care. We plan to proceed with the scheduled procedure.  I discussed the risk benefits and alternatives to placement of a tunneled hemodialysis catheter.  All questions were answered.  Understanding expressed.  Consent obtained and placed in chart. ? ?Evaristo Bury, MD ? ?01/10/2022, 8:57 AM ?  ?

## 2022-01-10 NOTE — Op Note (Signed)
OPERATIVE NOTE ? ? ? ?PRE-OPERATIVE DIAGNOSIS: 1. ESRD ?2 ? ?POST-OPERATIVE DIAGNOSIS: same as above ? ?PROCEDURE: ?Ultrasound guidance for vascular access to the right internal jugular vein ?Fluoroscopic guidance for placement of catheter ?Placement of a 19 cm tip to cuff tunneled hemodialysis catheter via the right internal jugular vein ? ?SURGEON: Jamesetta So, MD ? ?ANESTHESIA:  Local with Moderate conscious sedation for approximately 23 minutes using 2 mg of Versed and 50 mcg of Fentanyl ? ?ESTIMATED BLOOD LOSS: 5 cc ? ?FLUORO TIME: less than one minute ? ?CONTRAST: none ? ?FINDING(S): ?1.  Patent right internal jugular vein ? ?SPECIMEN(S):  None ? ?INDICATIONS:   ?Deanna [name] is a 71 y.o.female who presents with CKD IV with ARF now with ESRD.  The patient needs long term dialysis access for their ESRD, and a Permcath is necessary.  Risks and benefits are discussed and informed consent is obtained.   ? ?DESCRIPTION: ?After obtaining full informed written consent, the patient was brought back to the vascular suited. The patient's right neck and chest were sterilely prepped and draped in a sterile surgical field was created. Moderate conscious sedation was administered during a face to face encounter with the patient throughout the procedure with my supervision of the RN administering medicines and monitoring the patient's vital signs, pulse oximetry, telemetry and mental status throughout from the start of the procedure until the patient was taken to the recovery room.  The right internal jugular vein was visualized with ultrasound and found to be patent. It was then accessed under direct ultrasound guidance and a permanent image was recorded. A wire was placed. After skin nick and dilatation, the peel-away sheath was placed over the wire. ?I then turned my attention to an area under the clavicle. Approximately 1-2 fingerbreadths below the clavicle a small counterincision was created and tunneled from the  subclavicular incision to the access site. Using fluoroscopic guidance, a 19 centimeter tip to cuff tunneled hemodialysis catheter was selected, and tunneled from the subclavicular incision to the access site. It was then placed through the peel-away sheath and the peel-away sheath was removed. Using fluoroscopic guidance the catheter tips were parked in the right atrium. The appropriate distal connectors were placed. It withdrew blood well and flushed easily with heparinized saline and a concentrated heparin solution was then placed. It was secured to the chest wall with 2 Prolene sutures. The access incision was closed single 4-0 Monocryl. A 4-0 Monocryl pursestring suture was placed around the exit site. Sterile dressings were placed. ?The patient tolerated the procedure well and was taken to the recovery room in stable condition. ? ?COMPLICATIONS: None ? ?CONDITION: Stable ? ?Evaristo Bury, MD ?01/10/2022 ?10:37 AM ? ? ?This note was created with Dragon Medical transcription system. Any errors in dictation are purely unintentional.  ?

## 2022-01-10 NOTE — Progress Notes (Signed)
?   01/05/22 1030 01/05/22 1045 01/05/22 1100  ?Vitals  ?BP (!) 129/55 (!) 121/55 (!) 117/53  ?MAP (mmHg) 76 73 72  ?Pulse Rate 90 86 86  ?ECG Heart Rate 91 87 86  ?Resp '14 15 20  '$ ?During Hemodialysis Assessment  ?Blood Flow Rate (mL/min) 250 mL/min 250 mL/min 250 mL/min  ?Arterial Pressure (mmHg) -90 mmHg -90 mmHg -100 mmHg  ?Venous Pressure (mmHg) 80 mmHg 80 mmHg 80 mmHg  ?Transmembrane Pressure (mmHg) 50 mmHg 50 mmHg 50 mmHg  ?Ultrafiltration Rate (mL/min) 200 mL/min 200 mL/min 200 mL/min  ?Dialysate Flow Rate (mL/min) 300 ml/min 300 ml/min 300 ml/min  ?Conductivity: Machine  13.7 13.7 13.7  ?HD Safety Checks Performed Yes Yes Yes  ?Dialysis Fluid Bolus Normal Saline  --   --   ?Bolus Amount (mL) 250 mL  --   --   ?Intra-Hemodialysis Comments Tx initiated Progressing as prescribed Progressing as prescribed  ? ? 01/05/22 1115 01/05/22 1130 01/05/22 1145  ?Vitals  ?BP (!) 112/52 (!) 113/52 (!) 116/54  ?MAP (mmHg) 70 71 73  ?Pulse Rate 85 85 86  ?ECG Heart Rate 85 85 85  ?Resp (!) '22 12 16  '$ ?During Hemodialysis Assessment  ?Blood Flow Rate (mL/min) 250 mL/min 250 mL/min 250 mL/min  ?Arterial Pressure (mmHg) -100 mmHg -100 mmHg -100 mmHg  ?Venous Pressure (mmHg) 80 mmHg 80 mmHg 90 mmHg  ?Transmembrane Pressure (mmHg) 50 mmHg 50 mmHg 30 mmHg  ?Ultrafiltration Rate (mL/min) 200 mL/min 200 mL/min 200 mL/min  ?Dialysate Flow Rate (mL/min) 300 ml/min 300 ml/min 300 ml/min  ?Conductivity: Machine  13.7 13.7 13.7  ?HD Safety Checks Performed Yes Yes Yes  ?Dialysis Fluid Bolus  --   --   --   ?Bolus Amount (mL)  --   --   --   ?Intra-Hemodialysis Comments Progressing as prescribed Progressing as prescribed Progressing as prescribed  ? ?

## 2022-01-10 NOTE — Progress Notes (Signed)
?Deanna [name] Kidney  ?ROUNDING NOTE  ? ?Subjective:  ? ?Patient seen prior to vascular procedure ?Denies pain and discomfort ?Continues to complain of excessive hunger ?Patient seen after procedure, denies pain ?Remains on room air ? ? ?Objective:  ?Vital signs in last 24 hours:  ?Temp:  [98 ?F (36.7 ?C)-98.6 ?F (37 ?C)] 98.1 ?F (36.7 ?C) (04/18 1204) ?Pulse Rate:  [0-103] 93 (04/18 1204) ?Resp:  [14-20] 18 (04/18 1204) ?BP: (120-135)/(54-72) 123/64 (04/18 1204) ?SpO2:  [94 %-100 %] 98 % (04/18 1204) ?Weight:  [82.5 kg] 82.5 kg (04/17 1300) ? ?Weight change:  ?Filed Weights  ? 01/06/22 1557 01/09/22 1009 01/09/22 1300  ?Weight: 82.2 kg 83.5 kg 82.5 kg  ? ? ?Intake/Output: ?I/O last 3 completed shifts: ?In: 1779 [P.O.:960; I.V.:38; Blood:310; IV Piggyback:100] ?Out: 501 [Other:500; Stool:1] ?  ?Intake/Output this shift: ? Total I/O ?In: 200 [IV TJQZESPQZ:300] ?Out: -  ? ?Physical Exam: ?General: NAD, resting in bed  ?Head: Normocephalic, atraumatic. Moist oral mucosal membranes  ?Eyes: Anicteric  ?Lungs:  Diminished, normal effort  ?Heart: Regular rate and rhythm  ?Abdomen:  Soft, nontender  ?Extremities: No peripheral edema.  ?Neurologic: Nonfocal, moving all four extremities  ?Skin: No lesions  ?Access: Rt IJ Permcath placed on 01/10/22   ? ? ?Basic Metabolic Panel: ?Recent Labs  ?Lab 01/05/22 ?0313 01/06/22 ?0431 01/07/22 ?0651 01/07/22 ?1327 01/08/22 ?0159 01/09/22 ?7622 01/09/22 ?1034 01/09/22 ?1124  ?NA 131* 135 137 136 136 138  --   --   ?K 5.5* 5.1 4.9 5.3* 5.7* 5.9* 6.1* 4.6  ?CL 99 101 99 99 98 97*  --   --   ?CO2 '23 25 29 27 29 29  '$ --   --   ?GLUCOSE 251* 142* 59* 243* 172* 197*  --   --   ?BUN 80* 68* 42* 48* 62* 75*  --   --   ?CREATININE 3.65* 2.88* 2.11* 2.34* 2.95* 3.50*  --   --   ?CALCIUM 8.1* 8.3* 8.5* 8.1* 8.2* 8.5*  --   --   ?MG 1.6* 1.7  --   --  1.7 1.8  --   --   ?PHOS 5.0* 5.3*  5.3*  --   --  6.1* 5.8*  --   --   ? ? ? ?Liver Function Tests: ?Recent Labs  ?Lab 01/05/22 ?0313  01/06/22 ?0431 01/07/22 ?0651 01/07/22 ?1327 01/08/22 ?0159 01/09/22 ?6333  ?AST 94*  --  99* 155* 131*  --   ?ALT 152*  --  174* 192* 192*  --   ?ALKPHOS 152*  --  178* 227* 218*  --   ?BILITOT 0.5  --  0.5 0.7 0.4  --   ?PROT 6.2*  --  6.3* 6.5 6.2*  --   ?ALBUMIN 2.3* 2.4* 2.5* 2.5* 2.3* 2.3*  ? ? ?No results for input(s): LIPASE, AMYLASE in the last 168 hours. ?No results for input(s): AMMONIA in the last 168 hours. ? ?CBC: ?Recent Labs  ?Lab 01/06/22 ?1015 01/07/22 ?0651 01/07/22 ?1327 01/08/22 ?0159 01/09/22 ?5456  ?WBC 14.9* 20.4* 25.1* 15.0* 11.0*  ?HGB 6.5* 8.9* 8.7* 7.8* 7.0*  ?HCT 20.4* 27.4* 27.9* 24.9* 22.4*  ?MCV 84.6 83.3 86.1 85.6 87.5  ?PLT 276 265 253 235 238  ? ? ? ?Cardiac Enzymes: ?No results for input(s): CKTOTAL, CKMB, CKMBINDEX, TROPONINI in the last 168 hours. ? ?BNP: ?Invalid input(s): POCBNP ? ?CBG: ?Recent Labs  ?Lab 01/09/22 ?2114 01/10/22 ?0801 01/10/22 ?2563 01/10/22 ?8937 01/10/22 ?1203  ?GLUCAP 110* 80 119*  105* 263*  ? ? ? ?Microbiology: ?Results for orders placed or performed during the hospital encounter of 12/26/21  ?Respiratory (~20 pathogens) panel by PCR     Status: Abnormal  ? Collection Time: 12/27/21 11:32 AM  ? Specimen: Nasopharyngeal Swab; Respiratory  ?Result Value Ref Range Status  ? Adenovirus NOT DETECTED NOT DETECTED Final  ? Coronavirus 229E NOT DETECTED NOT DETECTED Final  ?  Comment: (NOTE) ?The Coronavirus on the Respiratory Panel, DOES NOT test for the novel  ?Coronavirus (2019 nCoV) ?  ? Coronavirus HKU1 NOT DETECTED NOT DETECTED Final  ? Coronavirus NL63 NOT DETECTED NOT DETECTED Final  ? Coronavirus OC43 NOT DETECTED NOT DETECTED Final  ? Metapneumovirus NOT DETECTED NOT DETECTED Final  ? Rhinovirus / Enterovirus NOT DETECTED NOT DETECTED Final  ? Influenza A NOT DETECTED NOT DETECTED Final  ? Influenza B NOT DETECTED NOT DETECTED Final  ? Parainfluenza Virus 1 NOT DETECTED NOT DETECTED Final  ? Parainfluenza Virus 2 NOT DETECTED NOT DETECTED Final  ?  Parainfluenza Virus 3 DETECTED (A) NOT DETECTED Final  ? Parainfluenza Virus 4 NOT DETECTED NOT DETECTED Final  ? Respiratory Syncytial Virus NOT DETECTED NOT DETECTED Final  ? Bordetella pertussis NOT DETECTED NOT DETECTED Final  ? Bordetella Parapertussis NOT DETECTED NOT DETECTED Final  ? Chlamydophila pneumoniae NOT DETECTED NOT DETECTED Final  ? Mycoplasma pneumoniae NOT DETECTED NOT DETECTED Final  ?  Comment: Performed at [facility], [place] [address], [place], [place] [address]  ?Resp Panel by RT-PCR (Flu A&B, Covid) Nasopharyngeal Swab     Status: None  ? Collection Time: 12/27/21 11:32 AM  ? Specimen: Nasopharyngeal Swab; Nasopharyngeal(NP) swabs in vial transport medium  ?Result Value Ref Range Status  ? SARS Coronavirus 2 by RT PCR NEGATIVE NEGATIVE Final  ?  Comment: (NOTE) ?SARS-CoV-2 target nucleic acids are NOT DETECTED. ? ?The SARS-CoV-2 RNA is generally detectable in upper respiratory ?specimens during the acute phase of infection. The lowest ?concentration of SARS-CoV-2 viral copies this assay can detect is ?138 copies/mL. A negative result does not preclude SARS-Cov-2 ?infection and should not be used as the sole basis for treatment or ?other patient management decisions. A negative result may occur with  ?improper specimen collection/handling, submission of specimen other ?than nasopharyngeal swab, presence of viral mutation(s) within the ?areas targeted by this assay, and inadequate number of viral ?copies(<138 copies/mL). A negative result must be combined with ?clinical observations, patient history, and epidemiological ?information. The expected result is Negative. ? ?Fact Sheet for Patients:  ?EntrepreneurPulse.com.au ? ?Fact Sheet for Healthcare Providers:  ?IncredibleEmployment.be ? ?This test is no t yet approved or cleared by the [place] FDA and  ?has been authorized for detection and/or diagnosis of SARS-CoV-2 by ?FDA under an Emergency Use  Authorization (EUA). This EUA will remain  ?in effect (meaning this test can be used) for the duration of the ?COVID-19 declaration under Section 564(b)(1) of the Act, 21 ?U.S.C.section 360bbb-3(b)(1), unless the authorization is terminated  ?or revoked sooner.  ? ? ?  ? Influenza A by PCR NEGATIVE NEGATIVE Final  ? Influenza B by PCR NEGATIVE NEGATIVE Final  ?  Comment: (NOTE) ?The Xpert Xpress SARS-CoV-2/FLU/RSV plus assay is intended as an aid ?in the diagnosis of influenza from Nasopharyngeal swab specimens and ?should not be used as a sole basis for treatment. Nasal washings and ?aspirates are unacceptable for Xpert Xpress SARS-CoV-2/FLU/RSV ?testing. ? ?Fact Sheet for Patients: ?EntrepreneurPulse.com.au ? ?Fact Sheet for Healthcare Providers: ?IncredibleEmployment.be ? ?  This test is not yet approved or cleared by the [place] FDA and ?has been authorized for detection and/or diagnosis of SARS-CoV-2 by ?FDA under an Emergency Use Authorization (EUA). This EUA will remain ?in effect (meaning this test can be used) for the duration of the ?COVID-19 declaration under Section 564(b)(1) of the Act, 21 U.S.C. ?section 360bbb-3(b)(1), unless the authorization is terminated or ?revoked. ? ?Performed at [facility], [place], ?[place] [address] ?  ?MRSA Next Gen by PCR, Nasal     Status: None  ? Collection Time: 01/07/22  1:34 PM  ? Specimen: Nasal Mucosa; Nasal Swab  ?Result Value Ref Range Status  ? MRSA by PCR Next Gen NOT DETECTED NOT DETECTED Final  ?  Comment: (NOTE) ?The GeneXpert MRSA Assay (FDA approved for NASAL specimens only), ?is one component of a comprehensive MRSA colonization surveillance ?program. It is not intended to diagnose MRSA infection nor to guide ?or monitor treatment for MRSA infections. ?Test performance is not FDA approved in patients less than 2 years ?old. ?Performed at [facility], [place], ?[place] [address] ?  ?Culture, blood (Routine X 2) w Reflex to ID Panel     Status: None (Preliminary result)  ? Collection Time: 01/07/22  5:51 PM  ? Specimen: BLOOD  ?Result Value Ref Range Status  ? Specimen Descript

## 2022-01-10 NOTE — Progress Notes (Signed)
?   01/06/22 1604 01/06/22 1615 01/06/22 1622  ?Vitals  ?Temp  --   --  98.5 ?F (36.9 ?C)  ?Temp Source  --   --  Oral  ?BP  --  134/66  --   ?MAP (mmHg)  --  86  --   ?Pulse Rate  --  99 96  ?ECG Heart Rate  --  99 98  ?Resp  --  20 (!) 22  ?During Hemodialysis Assessment  ?Blood Flow Rate (mL/min) 300 mL/min 300 mL/min  --   ?Arterial Pressure (mmHg) -110 mmHg -110 mmHg  --   ?Venous Pressure (mmHg) 80 mmHg 110 mmHg  --   ?Transmembrane Pressure (mmHg) 60 mmHg 60 mmHg  --   ?Ultrafiltration Rate (mL/min) 320 mL/min 320 mL/min  --   ?Dialysate Flow Rate (mL/min) 600 ml/min 600 ml/min  --   ?Conductivity: Machine  13.9 13.9  --   ?HD Safety Checks Performed Yes Yes  --   ?Dialysis Fluid Bolus Normal Saline  --   --   ?Bolus Amount (mL) 250 mL  --   --   ?Intra-Hemodialysis Comments Tx initiated Progressing as prescribed  --   ? ? 01/06/22 1624 01/06/22 1630 01/06/22 1640  ?Vitals  ?Temp 98.5 ?F (36.9 ?C)  --  98.2 ?F (36.8 ?C)  ?Temp Source Oral  --  Oral  ?BP  --  132/66  --   ?MAP (mmHg)  --  85  --   ?Pulse Rate 99 97 95  ?ECG Heart Rate 98 98 96  ?Resp '18 18 17  '$ ?During Hemodialysis Assessment  ?Blood Flow Rate (mL/min)  --  300 mL/min  --   ?Arterial Pressure (mmHg)  --  -110 mmHg  --   ?Venous Pressure (mmHg)  --  100 mmHg  --   ?Transmembrane Pressure (mmHg)  --  60 mmHg  --   ?Ultrafiltration Rate (mL/min)  --  320 mL/min  --   ?Dialysate Flow Rate (mL/min)  --  600 ml/min  --   ?Conductivity: Machine   --  13.9  --   ?HD Safety Checks Performed  --  Yes  --   ?Dialysis Fluid Bolus  --   --   --   ?Bolus Amount (mL)  --   --   --   ?Intra-Hemodialysis Comments  --  Progressing as prescribed  --   ? ? ?

## 2022-01-11 ENCOUNTER — Encounter: Payer: Self-pay | Admitting: Vascular Surgery

## 2022-01-11 DIAGNOSIS — R531 Weakness: Secondary | ICD-10-CM | POA: Diagnosis not present

## 2022-01-11 LAB — GLUCOSE, CAPILLARY
Glucose-Capillary: 268 mg/dL — ABNORMAL HIGH (ref 70–99)
Glucose-Capillary: 155 mg/dL — ABNORMAL HIGH (ref 70–99)
Glucose-Capillary: 92 mg/dL (ref 70–99)
Glucose-Capillary: 73 mg/dL (ref 70–99)
Glucose-Capillary: 102 mg/dL — ABNORMAL HIGH (ref 70–99)

## 2022-01-11 MED ORDER — ALTEPLASE 2 MG IJ SOLR: 2.0000 mg | Freq: Once | INTRAMUSCULAR | Status: DC | PRN

## 2022-01-11 MED ORDER — SODIUM CHLORIDE 0.9 % IV SOLN: 100.0000 mL | INTRAVENOUS | Status: DC | PRN

## 2022-01-11 MED ORDER — LIDOCAINE-PRILOCAINE 2.5-2.5 % EX CREA: 1.0000 "application " | TOPICAL_CREAM | CUTANEOUS | Status: DC | PRN

## 2022-01-11 MED ORDER — PENTAFLUOROPROP-TETRAFLUOROETH EX AERO: 1.0000 "application " | INHALATION_SPRAY | CUTANEOUS | Status: DC | PRN

## 2022-01-11 MED ORDER — LIDOCAINE HCL (PF) 1 % IJ SOLN: 5.0000 mL | INTRAMUSCULAR | Status: DC | PRN

## 2022-01-11 MED ORDER — HEPARIN SODIUM (PORCINE) 1000 UNIT/ML DIALYSIS
1000.0000 [IU] | INTRAMUSCULAR | Status: DC | PRN
  Administered 2022-01-11: 3200 [IU] via INTRAVENOUS_CENTRAL

## 2022-01-11 MED ORDER — SODIUM CHLORIDE 0.9 % IV SOLN
1.0000 g | INTRAVENOUS | Status: AC
  Administered 2022-01-11: 1 g via INTRAVENOUS
  Filled 2022-01-11: qty 1

## 2022-01-11 NOTE — Evaluation (Signed)
Physical Therapy Re- Evaluation ?Patient Details ?Name: Deanna [name] ?MRN: [id number] ?DOB: 1951/04/28 ?Today's Date: 01/11/2022 ? ?History of Present Illness ? 71 y.o. female with medical history significant of hypertension, CAD with recent stents presenting with right-sided weakness, worse since yesterday evening. 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 No  hemorrhage or mass effect. Rapid response and ICU t/f on 01/06/22. ?  ?Clinical Impression ? Pt admitted with above diagnosis. Pt received upright in chair. Agreeable for PT Re-Eval due to change in medical status leading to transfer to CCU on 01/06/22. Home layout and PLOF previously documented. Pt remains to appear near her baseline function. Pt reports baseline issues with ascending 14 stairs to enter apartment now having greater concerns due to hospitalization. At baseline pt is a household ambulator with [phone] and has constant family assist in her home but has had frequent falls prior to admission. To date, pt minguard to stand to RW with VC's for hand placement and ambulating 27' with RW with good stability appreciated. Pt demonstrates decreased step lengths and limited foot clearance bilat with very flexed posture despite VC's to attempt to correct. Pt returned to sitting EOB and supine with supervision. All needs in reach. Due to frequent issues with stairs at baseline and frequent falls, and now dealing with prolonged admission, pt would greatly benefit from STR at discharge to improve strength and endurance prior to transitioning to home environment due to deficits in LE strength/endurance for OOB activity. Pt currently with functional limitations due to the deficits listed below (see PT Problem List). Pt will benefit from skilled PT to increase their independence and safety with mobility to allow discharge to the venue listed below.      ? ?Recommendations for follow up therapy are one component of a multi-disciplinary discharge planning process, led by the attending physician.  Recommendations may be updated based on patient status, additional functional criteria and insurance authorization. ? ?Follow Up Recommendations Skilled nursing-short term rehab (<3 hours/day) ? ?  ?Assistance Recommended at Discharge Intermittent Supervision/Assistance  ?Patient can return home with the following ? Help with stairs or ramp for entrance;Assist for transportation;Assistance with cooking/housework;A little help with walking and/or transfers;A little help with bathing/dressing/bathroom ? ?  ?Equipment Recommendations Other (comment) (tbd by next venue of care)  ?Recommendations for Other Services ?    ?  ?Functional Status Assessment Patient has had a recent decline in their functional status and demonstrates the ability to make significant improvements in function in a reasonable and predictable amount of time.  ? ?  ?Precautions / Restrictions Precautions ?Precautions: Fall ?Restrictions ?Weight Bearing Restrictions: No  ? ?  ? ?Mobility ? Bed Mobility ?  ?  ?  ?  ?  ?  ?  ?General bed mobility comments: NT. In recliner pre and post testing ?Patient Response: Cooperative ? ?Transfers ?Overall transfer level: Needs assistance ?Equipment used: Rolling walker (2 wheels) ?Transfers: Sit to/from Stand ?Sit to Stand: Min guard ?  ?  ?  ?  ?  ?  ?  ? ?Ambulation/Gait ?Ambulation/Gait assistance: Min guard ?Gait Distance (Feet): 60 Feet ?Assistive device: Rolling walker (2 wheels) ?Gait Pattern/deviations: Step-to pattern, Decreased step length - left, Decreased step length - right, Trunk flexed, Narrow base of support ?  ?  ?  ?General Gait Details: AMb slowly but steadily with RW. Decreased foot clearance and step lengths bilaterally ? ?Stairs ?  ?  ?  ?  ?  ? ?  Wheelchair Mobility ?  ? ?Modified Rankin (Stroke Patients Only) ?  ? ?  ? ?Balance Overall balance  assessment: Needs assistance ?Sitting-balance support: No upper extremity supported, Feet supported ?Sitting balance-[name]: Good ?  ?  ?Standing balance support: Bilateral upper extremity supported, During functional activity ?Standing balance-[name]: Fair ?Standing balance comment: reliant on walker, poor standing tolerance ?  ?  ?  ?  ?  ?  ?  ?  ?  ?  ?  ?   ? ? ? ?Pertinent Vitals/Pain Pain Assessment ?Pain Assessment: No/denies pain  ? ? ?Home Living Family/patient expects to be discharged to:: Private residence ?Living Arrangements: Other relatives;Children ?Available Help at Discharge: Family;Available 24 hours/day ?Type of Home: Apartment ?Home Access: Stairs to enter ?Entrance Stairs-Rails: Right ?Entrance Stairs-Number of Steps: 14 ?  ?Home Layout: One level ?Home Equipment: Associate Professor (4 wheels) ?   ?  ?Prior Function Prior Level of Function : Needs assist ?  ?  ?  ?Physical Assist : Mobility (physical);ADLs (physical) ?Mobility (physical): Transfers;Gait;Stairs ?ADLs (physical): Bathing;Dressing;Toileting;IADLs ?Mobility Comments: Pt reports that she always has someone with her but does not always need phyiscal assist, reports functional mobility of short distances (~24f) with RW, and heavy assist stair navigation. Pt endorses countless falls (d/t knee buckling and "passing out"). Pt reports that her caregivers have fallen when providing assistance during mobility ?ADLs Comments: At baseline, pt is independent with feeding, seated grooming, seated UB dressing, and seated peri-care. Family assists with LB dressing, bathing, and toilet transfers ?  ? ? ?Hand Dominance  ? Dominant Hand: Right ? ?  ?Extremity/Trunk Assessment  ? Upper Extremity Assessment ?Upper Extremity Assessment: Defer to OT evaluation ?  ? ?Lower Extremity Assessment ?Lower Extremity Assessment: Overall WFL for tasks assessed ?  ? ?Cervical / Trunk Assessment ?Cervical / Trunk Assessment: Kyphotic   ?Communication  ? Communication: No difficulties  ?Cognition Arousal/Alertness: Awake/alert ?Behavior During Therapy: [facility]for tasks assessed/performed ?Overall Cognitive Status: Within Functional Limits for tasks assessed ?  ?  ?  ?  ?  ?  ?  ?  ?  ?  ?  ?  ?  ?  ?  ?  ?  ?  ?  ? ?  ?General Comments   ? ?  ?Exercises    ? ?Assessment/Plan  ?  ?PT Assessment Patient needs continued PT services  ?PT Problem List Decreased strength;Decreased range of motion;Decreased activity tolerance;Decreased balance;Decreased mobility;Decreased knowledge of use of DME;Decreased safety awareness;Cardiopulmonary status limiting activity ? ?   ?  ?PT Treatment Interventions DME instruction;Gait training;Stair training;Functional mobility training;Therapeutic activities;Therapeutic exercise;Balance training;Neuromuscular re-education;Patient/family education   ? ?PT Goals (Current goals can be found in the Care Plan section)  ?Acute Rehab PT Goals ?Patient Stated Goal: improving strength prior to going home ?PT Goal Formulation: With patient ?Time For Goal Achievement: 01/25/22 ?Potential to Achieve Goals: Good ? ?  ?Frequency Min 2X/week ?  ? ? ?Co-evaluation   ?  ?  ?  ?  ? ? ?  ?AM-PAC PT "6 Clicks" Mobility  ?Outcome Measure Help needed turning from your back to your side while in a flat bed without using bedrails?: A Little ?Help needed moving from lying on your back to sitting on the side of a flat bed without using bedrails?: A Little ?Help needed moving to and from a bed to a chair (including a wheelchair)?: A Little ?Help needed standing up from a chair using your arms (e.g., wheelchair or bedside  chair)?: A Little ?Help needed to walk in hospital room?: A Little ?Help needed climbing 3-5 steps with a railing? : Total ?6 Click Score: 16 ? ?  ?End of Session Equipment Utilized During Treatment: Gait belt ?Activity Tolerance: Patient tolerated treatment well ?Patient left: in bed;with call bell/phone within reach;with bed  alarm set ?Nurse Communication: Mobility status ?PT Visit Diagnosis: Muscle weakness (generalized) (M62.81);Difficulty in walking, not elsewhere classified (R26.2);History of falling (Z91.81) ?  ? ?Time: 9379-0240 ?PT Time Calculation

## 2022-01-11 NOTE — Evaluation (Signed)
Occupational Therapy Re-Evaluation ?Patient Details ?Name: Deanna [name] ?MRN: [id number] ?DOB: June 21, 1951 ?Today's Date: 01/11/2022 ? ? ?History of Present Illness 71 y.o. female with medical history significant of hypertension, CAD with recent stents presenting with right-sided weakness, worse since yesterday evening. 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 No  hemorrhage or mass effect.  ? ?Clinical Impression ?  ?Ms Huckeba was seen for OT re-evaluation on this date following rapid response and ICU t/f on 01/06/22. Upon arrival to room pt reclined in bed, RN at bedside, pt agreeable to tx. Pt requires CGA + RW for ADL t/f, tolerates ~40 ft mobility. Single UE support reaching outside BOS for funcitonal reaching task. Pt endorses 4/10 fatigue. Goals updated. Will continue to follow POC. Discharge recommendation remains appropriate.  ? ?   ? ?Recommendations for follow up therapy are one component of a multi-disciplinary discharge planning process, led by the attending physician.  Recommendations may be updated based on patient status, additional functional criteria and insurance authorization.  ? ?Follow Up Recommendations ? Home health OT  ?  ?Assistance Recommended at Discharge Frequent or constant Supervision/Assistance  ?Patient can return home with the following Help with stairs or ramp for entrance;Assistance with cooking/housework;A little help with walking and/or transfers;A little help with bathing/dressing/bathroom ? ?  ?Functional Status Assessment ? Patient has had a recent decline in their functional status and demonstrates the ability to make significant improvements in function in a reasonable and predictable amount of time.  ?Equipment Recommendations ? BSC/3in1  ?  ?Recommendations for Other Services   ? ? ?  ?Precautions / Restrictions Precautions ?Precautions: Fall ?Restrictions ?Weight  Bearing Restrictions: No  ? ?  ? ?Mobility Bed Mobility ?Overal bed mobility: Needs Assistance ?Bed Mobility: Supine to Sit ?  ?  ?Supine to sit: Supervision ?  ?  ?  ?  ? ?Transfers ?Overall transfer level: Needs assistance ?Equipment used: Rolling walker (2 wheels) ?Transfers: Sit to/from Stand ?Sit to Stand: Min guard ?  ?  ?  ?  ?  ?  ?  ? ?  ?Balance Overall balance assessment: Needs assistance ?Sitting-balance support: No upper extremity supported, Feet supported ?Sitting balance-[name]: Good ?  ?  ?Standing balance support: Single extremity supported, During functional activity ?Standing balance-[name]: Fair ?  ?  ?  ?  ?  ?  ?  ?  ?  ?  ?  ?  ?   ? ?ADL either performed or assessed with clinical judgement  ? ?ADL Overall ADL's : Needs assistance/impaired ?  ?  ?  ?  ?  ?  ?  ?  ?  ?  ?  ?  ?  ?  ?  ?  ?  ?  ?  ?General ADL Comments: CGA + RW for simulated toilet t/f. Single UE support reaching outside BOS for funcitonal reaching task  ? ? ? ? ?Pertinent Vitals/Pain Pain Assessment ?Pain Assessment: No/denies pain  ? ? ? ?Hand Dominance Right ?  ?   ?Communication Communication ?Communication: No difficulties ?  ?Cognition Arousal/Alertness: Awake/alert ?Behavior During Therapy: [facility] for tasks assessed/performed ?Overall Cognitive Status: Within Functional Limits for tasks assessed ?  ?  ?  ?  ?  ?  ?  ?  ?  ?  ?  ?  ?  ?  ?  ?  ?  ?  ?  ?   ?   ?   ? ? ?  Home Living Family/patient expects to be discharged to:: Private residence ?Living Arrangements: Other relatives;Children (grandchildren) ?Available Help at Discharge: Family;Available 24 hours/day ?Type of Home: Apartment ?Home Access: Stairs to enter ?Entrance Stairs-Number of Steps: 14 ?Entrance Stairs-Rails: Right ?Home Layout: One level ?  ?  ?Bathroom Shower/Tub: Tub/shower unit ?  ?Bathroom Toilet: Standard ?  ?  ?Home Equipment: Associate Professor (4 wheels) ?  ?  ?  ? ?  ?Prior Functioning/Environment Prior Level of Function : Needs  assist ?  ?  ?  ?Physical Assist : Mobility (physical);ADLs (physical) ?Mobility (physical): Transfers;Gait;Stairs ?ADLs (physical): Bathing;Dressing;Toileting;IADLs ?Mobility Comments: Pt reports that she always has someone with her but does not always need phyiscal assist, reports functional mobility of short distances (~69f) with RW, and heavy assist stair navigation. Pt endorses countless falls (d/t knee buckling and "passing out"). Pt reports that her caregivers have fallen when providing assistance during mobility ?  ?  ? ?  ?  ?OT Problem List: Decreased strength;Impaired balance (sitting and/or standing);Decreased safety awareness;Impaired sensation ?  ?   ?OT Treatment/Interventions: Self-care/ADL training;Therapeutic exercise;Neuromuscular education;DME and/or AE instruction;Therapeutic activities;Patient/family education;Balance training  ?  ?OT Goals(Current goals can be found in the care plan section) Acute Rehab OT Goals ?Patient Stated Goal: to go home ?OT Goal Formulation: With patient ?Time For Goal Achievement: 01/25/22 ?Potential to Achieve Goals: Fair  ?OT Frequency: Min 2X/week ?  ? ?Co-evaluation   ?  ?  ?  ?  ? ?  ?AM-PAC OT "6 Clicks" Daily Activity     ?Outcome Measure Help from another person eating meals?: None ?Help from another person taking care of personal grooming?: A Little ?Help from another person toileting, which includes using toliet, bedpan, or urinal?: A Little ?Help from another person bathing (including washing, rinsing, drying)?: A Little ?Help from another person to put on and taking off regular upper body clothing?: None ?Help from another person to put on and taking off regular lower body clothing?: A Little ?6 Click Score: 20 ?  ?End of Session   ? ?Activity Tolerance: Patient tolerated treatment well ?Patient left: in chair;with call bell/phone within reach;with chair alarm set ? ?OT Visit Diagnosis: History of falling (Z91.81);Muscle weakness (generalized) (M62.81)   ?              ?Time: [address]?OT Time Calculation (min): 20 min ?Charges:  OT General Charges ?$OT Visit: 1 Visit ?OT Evaluation ?$OT Re-eval: 1 Re-eval ?OT Treatments ?$Self Care/Home Management : 8-22 mins ? ?DDessie Coma M.S. OTR/L  ?01/11/22, 2:53 PM  ?ascom [phone]? ?

## 2022-01-11 NOTE — Progress Notes (Signed)
OT Cancellation Note ? ?Patient Details ?Name: Deanna [name] ?MRN: [id number] ?DOB: 08-12-1951 ? ? ?Cancelled Treatment:    Reason Eval/Treat Not Completed: Patient at procedure or test/ unavailable. Chart reviewed, new orders received. Pt currently off the floor for HD, will reattempt at later time as pt available.  ? ?Dessie Coma, M.S. OTR/L  ?01/11/22, 9:27 AM  ?ascom [phone] ? ?

## 2022-01-11 NOTE — Progress Notes (Signed)
PT Cancellation Note ? ?Patient Details ?Name: Deanna [name] ?MRN: [id number] ?DOB: Jan 02, 1951 ? ? ?Cancelled Treatment:    Reason Eval/Treat Not Completed: Patient at procedure or test/unavailable. Pt off floor for HD. Will re-attempt at later time/date as medically appropriate. ? ? ?Salem Caster. Fairly IV, PT, DPT ?Physical Therapist- [place]  ?[facility]  ?01/11/2022, 9:02 AM ?

## 2022-01-11 NOTE — Progress Notes (Signed)
?PROGRESS NOTE ? ? ? ?Deanna [name]  TKP:[id number] DOB: 11/27/1950 DOA: 12/26/2021 ?PCP: [facility] [place]  ? ? ?Chief Complaint  ?Patient presents with  ? Numbness  ? ? ?Brief Narrative: ?71 year old woman with PMH of CAD, HTN, CKD stage 4 (followed at [facility]) presented with numbness and weakness.  She had MRI of the cervical and thoracic spine which showed stenosis at multiple levels, mild to moderate and severe at right C5-6.  She was initiated on dialysis and has had demand ischemia with a  course of heparin provided, stopped on 4/17.  She underwent dialysis on 4/17.  She underwent catheter placement on 4/18 and dialysis again on 4/19.  No hospitalist note from 4/18, so unsure what was done that day ? ? ?Assessment & Plan: ?  ?Acute kidney injury superimposed on chronic kidney disease, now with ESRD, catheter in place and HD started ?Hyperphosphatemia ?Secondary hyperparathyroidism ?- Being managed by nephrology ?- HD today, patient did well ?- Continue HD as planned, next session Friday ?- Catheter placed yesterday, patient reports pain at site ?- Monitor hyperparathyroidism ? ?Right sided weakness ?- Severe right sided C5-C6 foraminal stenosis (MRI from 12/28/21) ? ?Essential (primary) hypertension ?- Continue metoprolol ?- HD ?   ?HFrEF (heart failure with reduced ejection fraction) ([place]) ?- Continue entresto ?- Continue metoprolol ?- Recheck TnI ?   ?Type 2 diabetes mellitus without complications ([place]) ?- Semglee 35 units daily ?- Novolog 4 units with meals ?- SSI ?- Not receiving meal dosing as of late ?- BS ranging from 90s to > 200s ?   ?Anemia, normocytic, chronic - mildly below baseline ?- No active bleeding, no transfusion needed at this time, no SOB or chest pain ?- EPO ordered by nephrology ?   ?Acute CVA (cerebrovascular accident) ?Punctate pericallosal white matter infarct ?- Continue aspirin/plavix ?   ?Lobar pneumonia ?Parainfluenza virus ?- CXR 4/15 - patchy infiltrate, pulmonary  edema ?- Received 5/5 days of cefepime, last dose 4/19.  ? 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 ID consult for treatment ? ?Elevated troponin ?- Will recheck in the AM now that she is on HD (last check 4/16) ?- No chest pain or any other symptoms ?- Likely related to acute illness.  ?- AM EKG ?   ?Hyperkalemia - resolved ?Hypokalemia - resolved ? ? ?DVT prophylaxis: SCDs and ambulation ?Code Status: Full ? ?Disposition:  ? ?Status is: Inpatient ?Remains inpatient appropriate because: need to establish for HD ?  ?Consultants:  ?Nephrology ?Neurology ?Cardiology ? ?Procedures:  ?Catheter placement 4/18 ? ?Antimicrobials:  ?Cefepime for pneumonia, completed  ? ? ?Subjective: ?Patient was very chipper today.  She noted to be in good spirits after dialysis and not feeling tired.  She notes that she will "run out of here" when discharged.  ? ?Objective: ?Vitals:  ? 01/11/22 1145 01/11/22 1200 01/11/22 1204 01/11/22 1315  ?BP: (!) 144/62 138/63 138/63 (!) 142/73  ?Pulse: 92 89 91 96  ?Resp: (!) 0 15 18 (!) 22  ?Temp:   98.3 ?F (36.8 ?C) 98.3 ?F (36.8 ?C)  ?TempSrc:      ?SpO2: 99% 98% 97% 97%  ?Weight:   83.5 kg   ?Height:      ? ? ?Intake/Output Summary (Last 24 hours) at 01/11/2022 1521 ?Last data filed at 01/11/2022 1204 ?Gross per 24 hour  ?Intake 130.3 ml  ?  Output 500 ml  ?Net -369.7 ml  ? ?Filed Weights  ? 01/09/22 1300 01/11/22 0853 01/11/22 1204  ?Weight: 82.5 kg 84.9 kg 83.5 kg  ? ? ?Examination: ? ?General exam: Appears calm and comfortable, sitting in chair ?Respiratory system: Clear to auscultation. Respiratory effort normal.  No wheezing or crackles ?Cardiovascular system: RR, NR, no murmur, minimal pedal edema, non pitting ?Gastrointestinal system: +BS, NT, ND ?Central nervous system: Alert and oriented. No focal deficit.  Walking with PT and not noted to have any lean or weakness.  ?Extremities: No  edema noted.  ?Skin: No rashes, lesions or ulcers on exposed skin.  She has sutures in place at site of catheter placement on right neck.  ?Psychiatry: Judgement and insight appear normal. Mood & affect appropriate.  ? ? ? ?Data Reviewed: personally reviewed ? ?CBC: ?Recent Labs  ?Lab 01/07/22 ?0651 01/07/22 ?1327 01/08/22 ?0159 01/09/22 ?8828 01/10/22 ?1316  ?WBC 20.4* 25.1* 15.0* 11.0* 8.2  ?HGB 8.9* 8.7* 7.8* 7.0* 8.1*  ?HCT 27.4* 27.9* 24.9* 22.4* 26.3*  ?MCV 83.3 86.1 85.6 87.5 86.2  ?PLT 265 253 235 238 268  ? ? ?Basic Metabolic Panel: ?Recent Labs  ?Lab 01/05/22 ?0313 01/06/22 ?0431 01/07/22 ?0651 01/07/22 ?1327 01/08/22 ?0159 01/09/22 ?0034 01/09/22 ?1034 01/09/22 ?1124 01/10/22 ?1316  ?NA 131* 135 137 136 136 138  --   --  135  ?K 5.5* 5.1 4.9 5.3* 5.7* 5.9* 6.1* 4.6 5.1  ?CL 99 101 99 99 98 97*  --   --  99  ?CO2 '23 25 29 27 29 29  '$ --   --  27  ?GLUCOSE 251* 142* 59* 243* 172* 197*  --   --  258*  ?BUN 80* 68* 42* 48* 62* 75*  --   --  56*  ?CREATININE 3.65* 2.88* 2.11* 2.34* 2.95* 3.50*  --   --  2.98*  ?CALCIUM 8.1* 8.3* 8.5* 8.1* 8.2* 8.5*  --   --  8.4*  ?MG 1.6* 1.7  --   --  1.7 1.8  --   --   --   ?PHOS 5.0* 5.3*  5.3*  --   --  6.1* 5.8*  --   --  5.0*  ? ? ?GFR: ?Estimated Creatinine Clearance: 20.9 mL/min (A) (by C-G formula based on SCr of 2.98 mg/dL (H)). ? ?Liver Function Tests: ?Recent Labs  ?Lab 01/05/22 ?0313 01/06/22 ?0431 01/07/22 ?0651 01/07/22 ?1327 01/08/22 ?0159 01/09/22 ?9179 01/10/22 ?1316  ?AST 94*  --  99* 155* 131*  --   --   ?ALT 152*  --  174* 192* 192*  --   --   ?ALKPHOS 152*  --  178* 227* 218*  --   --   ?BILITOT 0.5  --  0.5 0.7 0.4  --   --   ?PROT 6.2*  --  6.3* 6.5 6.2*  --   --   ?ALBUMIN 2.3*   < > 2.5* 2.5* 2.3* 2.3* 2.3*  ? < > = values in this interval not displayed.  ? ? ?CBG: ?Recent Labs  ?Lab 01/10/22 ?1203 01/10/22 ?1716 01/10/22 ?2221 01/11/22 ?0754 01/11/22 ?1318  ?GLUCAP 263* 97 173* 268* 155*  ? ? ? ?Recent Results (from the past 240 hour(s))  ?MRSA Next  Gen by PCR, Nasal     Status: None  ? Collection Time: 01/07/22  1:34 PM  ? Specimen: Nasal Mucosa; Nasal Swab  ?Result Value Ref Range Status  ? MRSA by PCR Next Gen NOT DETECTED NOT DETECTED  Final  ?  Comment: (NOTE) ?The GeneXpert MRSA Assay (FDA approved for NASAL specimens only), ?is one component of a comprehensive MRSA colonization surveillance ?program. It is not intended to diagnose MRSA infection nor to guide ?or monitor treatment for MRSA infections. ?Test performance is not FDA approved in patients less than 2 years ?old. ?Performed at [facility], [place], ?[place] [address] ?  ?Culture, blood (Routine X 2) w Reflex to ID Panel     Status: None (Preliminary result)  ? Collection Time: 01/07/22  5:51 PM  ? Specimen: BLOOD  ?Result Value Ref Range Status  ? Specimen Description BLOOD RIGHT ANTECUBITAL  Final  ? Special Requests   Final  ?  BOTTLES DRAWN AEROBIC AND ANAEROBIC Blood Culture adequate volume  ? Culture   Final  ?  NO GROWTH 4 DAYS ?Performed at [facility], [address], [place], [place] [address] ?  ? Report Status PENDING  Incomplete  ?Culture, blood (Routine X 2) w Reflex to ID Panel     Status: None (Preliminary result)  ? Collection Time: 01/07/22  5:51 PM  ? Specimen: BLOOD  ?Result Value Ref Range Status  ? Specimen Description BLOOD BLOOD LEFT WRIST  Final  ? Special Requests   Final  ?  BOTTLES DRAWN AEROBIC AND ANAEROBIC Blood Culture adequate volume  ? Culture   Final  ?  NO GROWTH 4 DAYS ?Performed at [facility], [address] [place], [place] [address] ?  ? Report Status PENDING  Incomplete  ?  ? ? ? ? ? ?Radiology Studies: ?PERIPHERAL VASCULAR CATHETERIZATION ? ?Result Date: 01/10/2022 ?See surgical note for result. ? ?DG Chest Port 1 View ? ?Result Date: 01/10/2022 ?CLINICAL DATA:  Central line placement EXAM: PORTABLE CHEST 1 VIEW COMPARISON:  01/07/2022 FINDINGS: Dialysis catheter from an internal jugular approach with tip  at mid to low SVC. Patient rotated left. Mild cardiomegaly. Suspect tiny right pleural effusion. No pneumothorax. Resolved interstitial edema. Low lung volumes with mild bibasilar atelectasis. IMPRESSION

## 2022-01-11 NOTE — Progress Notes (Signed)
Hemodialysis Post Treatment Note: ? ?Tx date:01/11/2022 ?Tx time: 3 hours ?Access: Right CVC ?UF Removed: 43m ? ?Note: ? ?HD completed, tolerated well, No complications. Patient asymptomatic. ? ? ? ? ? ? ? ? ?  ?

## 2022-01-11 NOTE — Progress Notes (Signed)
?[facility]  ?ROUNDING NOTE  ? ?Subjective:  ? ?Patient seen and evaluated during dialysis ?  ?HEMODIALYSIS FLOWSHEET: ? ?Blood Flow Rate (mL/min): 300 mL/min ?Arterial Pressure (mmHg): -120 mmHg ?Venous Pressure (mmHg): 180 mmHg ?Transmembrane Pressure (mmHg): 60 mmHg ?Ultrafiltration Rate (mL/min): 330 mL/min ?Dialysate Flow Rate (mL/min): 600 ml/min ?Conductivity: Machine : 13.6 ?Conductivity: Machine : 13.6 ?Dialysis Fluid Bolus: Normal Saline ?Bolus Amount (mL): 250 mL ?Dialysate Change: 2K ? ?No complaints at this time ?Patient appears in good spirits ?Able to eat chef salad yesterday without nausea ? ? ?Objective:  ?Vital signs in last 24 hours:  ?Temp:  [98 ?F (36.7 ?C)-99.4 ?F (37.4 ?C)] 98.7 ?F (37.1 ?C) (04/19 0845) ?Pulse Rate:  [85-100] 92 (04/19 1100) ?Resp:  [0-20] 19 (04/19 1100) ?BP: (123-153)/(57-70) 140/62 (04/19 1100) ?SpO2:  [96 %-100 %] 99 % (04/19 1100) ?Weight:  [84.9 kg] 84.9 kg (04/19 0853) ? ?Weight change:  ?Filed Weights  ? 01/09/22 1009 01/09/22 1300 01/11/22 0853  ?Weight: 83.5 kg 82.5 kg 84.9 kg  ? ? ?Intake/Output: ?I/O last 3 completed shifts: ?In: 330.3 [I.V.:30.3; IV Piggyback:300] ?Out: 1 [Stool:1] ?  ?Intake/Output this shift: ? No intake/output data recorded. ? ?Physical Exam: ?General: NAD, resting in bed  ?Head: Normocephalic, atraumatic. Moist oral mucosal membranes  ?Eyes: Anicteric  ?Lungs:  Clear to auscultation, normal effort  ?Heart: Regular rate and rhythm  ?Abdomen:  Soft, nontender  ?Extremities: No peripheral edema.  ?Neurologic: Nonfocal, moving all four extremities  ?Skin: No lesions  ?Access: Rt IJ Permcath placed on 01/10/22   ? ? ?Basic Metabolic Panel: ?Recent Labs  ?Lab 01/05/22 ?0313 01/06/22 ?0431 01/07/22 ?0651 01/07/22 ?1327 01/08/22 ?0159 01/09/22 ?2395 01/09/22 ?1034 01/09/22 ?1124 01/10/22 ?1316  ?NA 131* 135 137 136 136 138  --   --  135  ?K 5.5* 5.1 4.9 5.3* 5.7* 5.9* 6.1* 4.6 5.1  ?CL 99 101 99 99 98 97*  --   --  99  ?CO2 '23 25 29 27  29 29  '$ --   --  27  ?GLUCOSE 251* 142* 59* 243* 172* 197*  --   --  258*  ?BUN 80* 68* 42* 48* 62* 75*  --   --  56*  ?CREATININE 3.65* 2.88* 2.11* 2.34* 2.95* 3.50*  --   --  2.98*  ?CALCIUM 8.1* 8.3* 8.5* 8.1* 8.2* 8.5*  --   --  8.4*  ?MG 1.6* 1.7  --   --  1.7 1.8  --   --   --   ?PHOS 5.0* 5.3*  5.3*  --   --  6.1* 5.8*  --   --  5.0*  ? ? ? ?Liver Function Tests: ?Recent Labs  ?Lab 01/05/22 ?0313 01/06/22 ?0431 01/07/22 ?0651 01/07/22 ?1327 01/08/22 ?0159 01/09/22 ?3202 01/10/22 ?1316  ?AST 94*  --  99* 155* 131*  --   --   ?ALT 152*  --  174* 192* 192*  --   --   ?ALKPHOS 152*  --  178* 227* 218*  --   --   ?BILITOT 0.5  --  0.5 0.7 0.4  --   --   ?PROT 6.2*  --  6.3* 6.5 6.2*  --   --   ?ALBUMIN 2.3*   < > 2.5* 2.5* 2.3* 2.3* 2.3*  ? < > = values in this interval not displayed.  ? ? ?No results for input(s): LIPASE, AMYLASE in the last 168 hours. ?No results for input(s): AMMONIA in the last 168  hours. ? ?CBC: ?Recent Labs  ?Lab 01/07/22 ?0651 01/07/22 ?1327 01/08/22 ?0159 01/09/22 ?2841 01/10/22 ?1316  ?WBC 20.4* 25.1* 15.0* 11.0* 8.2  ?HGB 8.9* 8.7* 7.8* 7.0* 8.1*  ?HCT 27.4* 27.9* 24.9* 22.4* 26.3*  ?MCV 83.3 86.1 85.6 87.5 86.2  ?PLT 265 253 235 238 268  ? ? ? ?Cardiac Enzymes: ?No results for input(s): CKTOTAL, CKMB, CKMBINDEX, TROPONINI in the last 168 hours. ? ?BNP: ?Invalid input(s): POCBNP ? ?CBG: ?Recent Labs  ?Lab 01/10/22 ?3244 01/10/22 ?1203 01/10/22 ?1716 01/10/22 ?2221 01/11/22 ?0754  ?GLUCAP 105* 263* 97 173* 268*  ? ? ? ?Microbiology: ?Results for orders placed or performed during the hospital encounter of 12/26/21  ?Respiratory (~20 pathogens) panel by PCR     Status: Abnormal  ? Collection Time: 12/27/21 11:32 AM  ? Specimen: Nasopharyngeal Swab; Respiratory  ?Result Value Ref Range Status  ? Adenovirus NOT DETECTED NOT DETECTED Final  ? Coronavirus 229E NOT DETECTED NOT DETECTED Final  ?  Comment: (NOTE) ?The Coronavirus on the Respiratory Panel, DOES NOT test for the novel  ?Coronavirus  (2019 nCoV) ?  ? Coronavirus HKU1 NOT DETECTED NOT DETECTED Final  ? Coronavirus NL63 NOT DETECTED NOT DETECTED Final  ? Coronavirus OC43 NOT DETECTED NOT DETECTED Final  ? Metapneumovirus NOT DETECTED NOT DETECTED Final  ? Rhinovirus / Enterovirus NOT DETECTED NOT DETECTED Final  ? Influenza A NOT DETECTED NOT DETECTED Final  ? Influenza B NOT DETECTED NOT DETECTED Final  ? Parainfluenza Virus 1 NOT DETECTED NOT DETECTED Final  ? Parainfluenza Virus 2 NOT DETECTED NOT DETECTED Final  ? Parainfluenza Virus 3 DETECTED (A) NOT DETECTED Final  ? Parainfluenza Virus 4 NOT DETECTED NOT DETECTED Final  ? Respiratory Syncytial Virus NOT DETECTED NOT DETECTED Final  ? Bordetella pertussis NOT DETECTED NOT DETECTED Final  ? Bordetella Parapertussis NOT DETECTED NOT DETECTED Final  ? Chlamydophila pneumoniae NOT DETECTED NOT DETECTED Final  ? Mycoplasma pneumoniae NOT DETECTED NOT DETECTED Final  ?  Comment: Performed at [facility], [place] [address] [place], [place] [address]  ?Resp Panel by RT-PCR (Flu A&B, Covid) Nasopharyngeal Swab     Status: None  ? Collection Time: 12/27/21 11:32 AM  ? Specimen: Nasopharyngeal Swab; Nasopharyngeal(NP) swabs in vial transport medium  ?Result Value Ref Range Status  ? SARS Coronavirus 2 by RT PCR NEGATIVE NEGATIVE Final  ?  Comment: (NOTE) ?SARS-CoV-2 target nucleic acids are NOT DETECTED. ? ?The SARS-CoV-2 RNA is generally detectable in upper respiratory ?specimens during the acute phase of infection. The lowest ?concentration of SARS-CoV-2 viral copies this assay can detect is ?138 copies/mL. A negative result does not preclude SARS-Cov-2 ?infection and should not be used as the sole basis for treatment or ?other patient management decisions. A negative result may occur with  ?improper specimen collection/handling, submission of specimen other ?than nasopharyngeal swab, presence of viral mutation(s) within the ?areas targeted by this assay, and inadequate number of  viral ?copies(<138 copies/mL). A negative result must be combined with ?clinical observations, patient history, and epidemiological ?information. The expected result is Negative. ? ?Fact Sheet for Patients:  ?EntrepreneurPulse.com.au ? ?Fact Sheet for Healthcare Providers:  ?IncredibleEmployment.be ? ?This test is no t yet approved or cleared by the [place] FDA and  ?has been authorized for detection and/or diagnosis of SARS-CoV-2 by ?FDA under an Emergency Use Authorization (EUA). This EUA will remain  ?in effect (meaning this test can be used) for the duration of the ?COVID-19 declaration under Section 564(b)(1) of  the Act, 21 ?U.S.C.section 360bbb-3(b)(1), unless the authorization is terminated  ?or revoked sooner.  ? ? ?  ? Influenza A by PCR NEGATIVE NEGATIVE Final  ? Influenza B by PCR NEGATIVE NEGATIVE Final  ?  Comment: (NOTE) ?The Xpert Xpress SARS-CoV-2/FLU/RSV plus assay is intended as an aid ?in the diagnosis of influenza from Nasopharyngeal swab specimens and ?should not be used as a sole basis for treatment. Nasal washings and ?aspirates are unacceptable for Xpert Xpress SARS-CoV-2/FLU/RSV ?testing. ? ?Fact Sheet for Patients: ?EntrepreneurPulse.com.au ? ?Fact Sheet for Healthcare Providers: ?IncredibleEmployment.be ? ?This test is not yet approved or cleared by the [place] FDA and ?has been authorized for detection and/or diagnosis of SARS-CoV-2 by ?FDA under an Emergency Use Authorization (EUA). This EUA will remain ?in effect (meaning this test can be used) for the duration of the ?COVID-19 declaration under Section 564(b)(1) of the Act, 21 U.S.C. ?section 360bbb-3(b)(1), unless the authorization is terminated or ?revoked. ? ?Performed at [facility], [place], ?[place] [address] ?  ?MRSA Next Gen by PCR, Nasal     Status: None  ? Collection Time: 01/07/22  1:34 PM  ? Specimen: Nasal Mucosa;  Nasal Swab  ?Result Value Ref Range Status  ? MRSA by PCR Next Gen NOT DETECTED NOT DETECTED Final  ?  Comment: (NOTE) ?The GeneXpert MRSA Assay (FDA approved for NASAL specimens only), ?is one component of a compreh

## 2022-01-11 NOTE — Plan of Care (Signed)

## 2022-01-12 DIAGNOSIS — R531 Weakness: Secondary | ICD-10-CM | POA: Diagnosis not present

## 2022-01-12 LAB — COMPREHENSIVE METABOLIC PANEL
ALT: 125 U/L — ABNORMAL HIGH (ref 0–44)
AST: 49 U/L — ABNORMAL HIGH (ref 15–41)
Albumin: 2 g/dL — ABNORMAL LOW (ref 3.5–5.0)
Alkaline Phosphatase: 306 U/L — ABNORMAL HIGH (ref 38–126)
Anion gap: 8 (ref 5–15)
BUN: 41 mg/dL — ABNORMAL HIGH (ref 8–23)
CO2: 27 mmol/L (ref 22–32)
Calcium: 8.4 mg/dL — ABNORMAL LOW (ref 8.9–10.3)
Chloride: 102 mmol/L (ref 98–111)
Creatinine, Ser: 2.31 mg/dL — ABNORMAL HIGH (ref 0.44–1.00)
GFR, Estimated: 22 mL/min — ABNORMAL LOW (ref >60)
Glucose, Bld: 75 mg/dL (ref 70–99)
Potassium: 4.3 mmol/L (ref 3.5–5.1)
Sodium: 137 mmol/L (ref 135–145)
Total Bilirubin: 0.4 mg/dL (ref 0.3–1.2)
Total Protein: 6 g/dL — ABNORMAL LOW (ref 6.5–8.1)

## 2022-01-12 LAB — GLUCOSE, CAPILLARY
Glucose-Capillary: 234 mg/dL — ABNORMAL HIGH (ref 70–99)
Glucose-Capillary: 89 mg/dL (ref 70–99)
Glucose-Capillary: 51 mg/dL — ABNORMAL LOW (ref 70–99)
Glucose-Capillary: 94 mg/dL (ref 70–99)
Glucose-Capillary: 100 mg/dL — ABNORMAL HIGH (ref 70–99)
Glucose-Capillary: 66 mg/dL — ABNORMAL LOW (ref 70–99)

## 2022-01-12 LAB — FUNGITELL, SERUM: Fungitell Result: <31 pg/mL (ref <80)

## 2022-01-12 LAB — CULTURE, BLOOD (ROUTINE X 2)
Culture: NO GROWTH
Culture: NO GROWTH
Special Requests: ADEQUATE
Special Requests: ADEQUATE

## 2022-01-12 LAB — TROPONIN I (HIGH SENSITIVITY): Troponin I (High Sensitivity): 678 ng/L (ref <18)

## 2022-01-12 MED ORDER — INSULIN GLARGINE-YFGN 100 UNIT/ML ~~LOC~~ SOLN
30.0000 [IU] | Freq: Every day | SUBCUTANEOUS | Status: DC
  Filled 2022-01-12: qty 0.3

## 2022-01-12 MED ORDER — INSULIN ASPART 100 UNIT/ML IJ SOLN: 2.0000 [IU] | Freq: Three times a day (TID) | INTRAMUSCULAR | Status: DC

## 2022-01-12 MED ORDER — INSULIN ASPART 100 UNIT/ML IJ SOLN: 0.0000 [IU] | Freq: Three times a day (TID) | INTRAMUSCULAR | Status: DC

## 2022-01-12 NOTE — Care Management Important Message (Signed)
Important Message ? ?Patient Details  ?Name: Deanna [name] ?MRN: [id number] ?Date of Birth: [birth year] ? ? ?Medicare Important Message Given:  Yes ? ? ? ? ?[name] ?01/12/2022, 2:47 PM ?

## 2022-01-12 NOTE — Progress Notes (Signed)
PT Cancellation Note ? ?Patient Details ?Name: Falisha Spargur ?MRN: [id number] ?DOB: 04-Dec-1950 ? ? ?Cancelled Treatment:    Reason Eval/Treat Not Completed: Patient declined, no reason specified. Pt declining PT this morning due to tiredness. Pt encouraged to attempt treatment with pt verbalizing later today. Will re-attempt as able. ? ? ?Salem Caster. Fairly IV, PT, DPT ?Physical Therapist- [place]  ?[facility]  ?01/12/2022, 11:41 AM ?

## 2022-01-12 NOTE — Progress Notes (Signed)
Nutrition Follow-up ? ?DOCUMENTATION CODES:  ? ?Not applicable ? ?INTERVENTION:  ? ?-Continue Nepro Shake po daily, each supplement provides 425 kcal and 19 grams protein  ?-Continue renal MVI daily ?-Continue double protein portions with meals ?-Continue carb modified diet ? ?NUTRITION DIAGNOSIS:  ? ?Inadequate oral intake related to poor appetite, early satiety as evidenced by per patient/family report. ? ?Ongoing ? ?GOAL:  ? ?Patient will meet greater than or equal to 90% of their needs ? ?Progressing  ? ?MONITOR:  ? ?PO intake, Supplement acceptance, Diet advancement, Labs, Weight trends ? ?REASON FOR ASSESSMENT:  ? ?Consult ?Assessment of nutrition requirement/status ? ?ASSESSMENT:  ? ?Pt admitted from home with R sided weakness, admitted to r/o stroke. PMH significant for HTN and CAD with recent stents. ? ?4/12- s/p PROCEDURE: ?Ultrasound guidance for vascular access right femoral vein ?Placement of a 30 cm triple-lumen dialysis catheter right femoral vein ?  ?4/18- s/p PROCEDURE: ?Ultrasound guidance for vascular access to the right internal jugular vein ?Fluoroscopic guidance for placement of catheter ?Placement of a 19 cm tip to cuff tunneled hemodialysis catheter via the right internal jugular vein ? ?Reviewed I/O's: -358 ml x 24 hours and +5.7 L since 12/29/21  ? ?Pt remains with good appetite. Noted meal completions 75-100%. Pt also taking Nepro supplements. She no longer complains of excess hunger.  ? ?Per TOC notes, plan possible discharge home tomorrow. Pt awaiting outpatient HD chair placement/ ? ?Medications reviewed and include lokelma.  ? ?Labs reviewed: CBGS: 73-234 (inpatient orders for glycemic control are 0-20 units insulin aspart TID with meals, 0-5 units insulin aspart daily at bedtime, 4 units insulin aspart TID with meals, and 35 units insulin glargine-yfgn daily).   ? ?Diet Order:   ?Diet Order   ? ?       ?  Diet Carb Modified Fluid consistency: Thin; Room service appropriate? Yes  Diet  effective now       ?  ? ?  ?  ? ?  ? ? ?EDUCATION NEEDS:  ? ?No education needs have been identified at this time ? ?Skin:  Skin Assessment: Reviewed RN Assessment ? ?Last BM:  01/11/22 ? ?Height:  ? ?Ht Readings from Last 1 Encounters:  ?01/10/22 '6\' 1"'$  (1.854 m)  ? ? ?Weight:  ? ?Wt Readings from Last 1 Encounters:  ?01/11/22 83.5 kg  ? ?BMI:  Body mass index is 24.29 kg/m?. ? ?Estimated Nutritional Needs:  ? ?Kcal:  1850-2050 ? ?Protein:  100-115 grams ? ?Fluid:  1000 ml + UOP ? ? ? ?Loistine Chance, RD, LDN, CDCES ?Registered Dietitian II ?Certified Diabetes Care and Education Specialist ?Please refer to [facility] for RD and/or RD on-call/weekend/after hours pager  ?

## 2022-01-12 NOTE — Plan of Care (Signed)
Patient has critical troponin of 678.  Notified on call NP. No new orders.  Pt had troponin of 2044 on 4/16. ?

## 2022-01-12 NOTE — Progress Notes (Signed)
Physical Therapy Treatment ?Patient Details ?Name: Deanna [name] ?MRN: [id number] ?DOB: 17-Nov-1950 ?Today's Date: 01/12/2022 ? ? ?History of Present Illness 71 y.o. female with medical history significant of hypertension, CAD with recent stents presenting with right-sided weakness, worse since yesterday evening. 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 No  hemorrhage or mass effect. Rapid response and ICU t/f on 01/06/22. ? ?  ?PT Comments  ? ? Pt received upright in bed, agreeable to PT. Pt remains mod-I to exit bed and supervision to stand to RW but with poor RW set up and hand placement despite education. And VC's. Pt tolerating progression of gait 68' with supervision but pt remains flexed at trunk and requires intermittent VC's for keeping feet within RW BOS and maintaining RW closer to pt's BOS for improved stability and reduced tripping hazard. Pt returned to supine in bed clearly fatigued with increased WOB. HR elevated to low 100's BPM with ambulation but quickly returns to mid 80's with rest.  All needs in reach, pt still displaying muscular weakness and poor tolerance for walking endurance with poor potential to be safe enough to asc 12-14 steps to safely enter home. Thus d/c recs to STR remains appropriate.  ?    ?Recommendations for follow up therapy are one component of a multi-disciplinary discharge planning process, led by the attending physician.  Recommendations may be updated based on patient status, additional functional criteria and insurance authorization. ? ?Follow Up Recommendations ? Skilled nursing-short term rehab (<3 hours/day) ?  ?  ?Assistance Recommended at Discharge Intermittent Supervision/Assistance  ?Patient can return home with the following Help with stairs or ramp for entrance;Assist for transportation;Assistance with cooking/housework;A little help with walking and/or  transfers;A little help with bathing/dressing/bathroom ?  ?Equipment Recommendations ? Other (comment)  ?  ?Recommendations for Other Services   ? ? ?  ?Precautions / Restrictions Precautions ?Precautions: Fall ?Restrictions ?Weight Bearing Restrictions: No  ?  ? ?Mobility ? Bed Mobility ?Overal bed mobility: Needs Assistance ?Bed Mobility: Supine to Sit, Sit to Supine ?  ?  ?Supine to sit: Supervision ?Sit to supine: Supervision ?  ?  ?Patient Response: Cooperative ? ?Transfers ?Overall transfer level: Needs assistance ?Equipment used: Rolling walker (2 wheels) ?Transfers: Sit to/from Stand ?Sit to Stand: Min guard ?  ?  ?  ?  ?  ?General transfer comment: VC's for safe use of RW with limited carryover ?  ? ?Ambulation/Gait ?Ambulation/Gait assistance: Supervision ?Gait Distance (Feet): 80 Feet ?Assistive device: Rolling walker (2 wheels) ?Gait Pattern/deviations: Step-to pattern, Decreased step length - left, Decreased step length - right, Trunk flexed, Narrow base of support ?  ?  ?  ?General Gait Details: AMb slowly but steadily with RW. Decreased foot clearance and step lengths bilaterally. Keeping RW far outside BOS despite VC's ? ? ?Stairs ?  ?  ?  ?  ?  ? ? ?Wheelchair Mobility ?  ? ?Modified Rankin (Stroke Patients Only) ?  ? ? ?  ?Balance Overall balance assessment: Needs assistance ?Sitting-balance support: No upper extremity supported, Feet supported ?Sitting balance-[name]: Good ?  ?  ?Standing balance support: Bilateral upper extremity supported, During functional activity ?Standing balance-[name]: Fair ?Standing balance comment: reliant on walker, poor standing tolerance ?  ?  ?  ?  ?  ?  ?  ?  ?  ?  ?  ?  ? ?  ?Cognition Arousal/Alertness: Awake/alert ?Behavior During  Therapy: WFL for tasks assessed/performed ?Overall Cognitive Status: Within Functional Limits for tasks assessed ?  ?  ?  ?  ?  ?  ?  ?  ?  ?  ?  ?  ?  ?  ?  ?  ?  ?  ?  ? ?  ?Exercises   ? ?  ?General Comments   ?  ?   ? ?Pertinent Vitals/Pain Pain Assessment ?Pain Assessment: No/denies pain  ? ? ?Home Living   ?  ?  ?  ?  ?  ?  ?  ?  ?  ?   ?  ?Prior Function    ?  ?  ?   ? ?PT Goals (current goals can now be found in the care plan section) Acute Rehab PT Goals ?Patient Stated Goal: improving strength prior to going home ?PT Goal Formulation: With patient ?Time For Goal Achievement: 01/25/22 ?Potential to Achieve Goals: Good ?Progress towards PT goals: Progressing toward goals ? ?  ?Frequency ? ? ? Min 2X/week ? ? ? ?  ?PT Plan Current plan remains appropriate  ? ? ?Co-evaluation   ?  ?  ?  ?  ? ?  ?AM-PAC PT "6 Clicks" Mobility   ?Outcome Measure ? Help needed turning from your back to your side while in a flat bed without using bedrails?: A Little ?Help needed moving from lying on your back to sitting on the side of a flat bed without using bedrails?: A Little ?Help needed moving to and from a bed to a chair (including a wheelchair)?: A Little ?Help needed standing up from a chair using your arms (e.g., wheelchair or bedside chair)?: A Little ?Help needed to walk in hospital room?: A Little ?Help needed climbing 3-5 steps with a railing? : A Lot ?6 Click Score: 17 ? ?  ?End of Session Equipment Utilized During Treatment: Gait belt ?Activity Tolerance: Patient tolerated treatment well ?Patient left: in bed;with call bell/phone within reach;with bed alarm set ?Nurse Communication: Mobility status ?PT Visit Diagnosis: Muscle weakness (generalized) (M62.81);Difficulty in walking, not elsewhere classified (R26.2);History of falling (Z91.81) ?  ? ? ?Time: 1344-1400 ?PT Time Calculation (min) (ACUTE ONLY): 16 min ? ?Charges:  $Therapeutic Exercise: 8-22 mins          ?          ?Salem Caster. Fairly IV, PT, DPT ?Physical Therapist- [place]  ?[facility]  ?01/12/2022, 2:24 PM ? ?

## 2022-01-12 NOTE — Progress Notes (Addendum)
?PROGRESS NOTE ? ? ? ?Deanna [name]  BTD:[id number] DOB: June 30, 1951 DOA: 12/26/2021 ?PCP: [facility] [place]  ? ? ?Chief Complaint  ?Patient presents with  ? Numbness  ? ? ?Brief Narrative: ?71 year old woman with PMH of CAD, HTN, CKD stage 4 (followed at [facility]) presented with numbness and weakness.  She had MRI of the cervical and thoracic spine which showed stenosis at multiple levels, mild to moderate and severe at right C5-6.  She was initiated on dialysis and has had demand ischemia with a  course of heparin provided, stopped on 4/17.  She underwent dialysis on 4/17.  She underwent catheter placement on 4/18 and dialysis again on 4/19.  No hospitalist note from 4/18, so unsure what was done that day ? ? ?Assessment & Plan: ?  ?Acute kidney injury superimposed on chronic kidney disease, now with ESRD, catheter in place and HD started ?Hyperphosphatemia ?Secondary hyperparathyroidism ?- Being managed by nephrology ?- HD planned for tomorrow seated in chair ?- tunneled Catheter placed 4/18, pain improved ?- Monitor hyperparathyroidism ? ?Right sided weakness ?- Severe right sided C5-C6 foraminal stenosis (MRI from 12/28/21) ?- Improved today ?- Outpatient follow up ? ?Essential (primary) hypertension ?- Continue metoprolol ?- HD ?- BP mildly elevated, defer further treatment to nephrology ?   ?HFrEF (heart failure with reduced ejection fraction), chronic ?- Continue entresto ?- Continue metoprolol ?- Recheck TnI - trending down with initiation of HD, no further work up ?   ?Type 2 diabetes mellitus without complications ([place]) ?- Semglee decreased to 30 units daily ?- Novolog 2 units with meals ?- SSI, sensitive, ESRD ?- BS ranging from 80s - 100s, but having lows with meal time coverage, changes as noted above.  ?   ?Anemia, normocytic, chronic - mildly below baseline ?- No active bleeding, no transfusion needed at this time, no SOB or chest pain ?- EPO ordered by nephrology ?   ?Acute CVA  (cerebrovascular accident) ?Punctate pericallosal white matter infarct ?- Continue aspirin/plavix ?   ?Lobar pneumonia ?Parainfluenza virus ?- CXR 4/15 - patchy infiltrate, pulmonary edema ?- Received 5/5 days of cefepime, last dose 4/19.  ? 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 ?Elevated troponin ?- down trending, no symptoms ?- No further work up ?   ?Hyperkalemia - resolved ?Hypokalemia - resolved ? ? ?DVT prophylaxis: SCDs and ambulation ?Code Status: Full ? ?Disposition:  ? ?Status is: Inpatient ?Remains inpatient appropriate because: need to establish for HD ?  ?Consultants:  ?Nephrology ?Neurology ?Cardiology ? ?Procedures:  ?Catheter placement 4/18 ? ?Antimicrobials:  ?Cefepime for pneumonia, completed  ? ? ?Subjective: ?Patient somewhat frustrated with insulin dosing this morning, but overall is feeling better.  She is nervous about HD in the chair.  Pain from catheter insertion is improved.  ? ?Objective: ?Vitals:  ? 01/11/22 1616 01/11/22 2041 01/12/22 0102 01/12/22 0518  ?BP: (!) 126/58 128/62 135/66 (!) 149/69  ?Pulse: 93 89 84 92  ?Resp: '17 16 16 16  '$ ?Temp: 98.2 ?F (36.8 ?C) 97.8 ?F (36.6 ?C) 98 ?F (36.7 ?C) 98.3 ?F (36.8 ?C)  ?TempSrc:  Oral Oral Oral  ?SpO2: 98% 99% 95% 98%  ?Weight:      ?Height:      ? ? ?Intake/Output Summary (Last 24 hours) at 01/12/2022 0828 ?Last data filed at 01/12/2022 0444 ?Gross per 24 hour  ?  Intake 141.8 ml  ?Output 500 ml  ?Net -358.2 ml  ? ?Filed Weights  ? 01/09/22 1300 01/11/22 0853 01/11/22 1204  ?Weight: 82.5 kg 84.9 kg 83.5 kg  ? ? ?Examination: ? ?General exam: Appears calm and comfortable, sitting in chair ?Respiratory system: Breathing comfortably on room air.  ?Cardiovascular system: RR, NR, no murmur ?Gastrointestinal system: +BS, NT, ND ?Central nervous system: Alert and oriented, strength is 5/5 throughout with  some subjective weakness of the right arm/hand ?Extremities: No edema noted.  ?Skin: No rashes, lesions or ulcers on exposed skin.  She has sutures in place at site of catheter placement on right neck, appear clean and dry ?Psychiatry: Judgement and insight appear normal. Mood & affect appropriate.  ? ?Data Reviewed: personally reviewed ? ?CBC: ?Recent Labs  ?Lab 01/07/22 ?0651 01/07/22 ?1327 01/08/22 ?0159 01/09/22 ?1517 01/10/22 ?1316  ?WBC 20.4* 25.1* 15.0* 11.0* 8.2  ?HGB 8.9* 8.7* 7.8* 7.0* 8.1*  ?HCT 27.4* 27.9* 24.9* 22.4* 26.3*  ?MCV 83.3 86.1 85.6 87.5 86.2  ?PLT 265 253 235 238 268  ? ? ?Basic Metabolic Panel: ?Recent Labs  ?Lab 01/06/22 ?0431 01/07/22 ?0651 01/07/22 ?1327 01/08/22 ?0159 01/09/22 ?6160 01/09/22 ?1034 01/09/22 ?1124 01/10/22 ?1316 01/12/22 ?0400  ?NA 135   < > 136 136 138  --   --  135 137  ?K 5.1   < > 5.3* 5.7* 5.9* 6.1* 4.6 5.1 4.3  ?CL 101   < > 99 98 97*  --   --  99 102  ?CO2 25   < > '27 29 29  '$ --   --  27 27  ?GLUCOSE 142*   < > 243* 172* 197*  --   --  258* 75  ?BUN 68*   < > 48* 62* 75*  --   --  56* 41*  ?CREATININE 2.88*   < > 2.34* 2.95* 3.50*  --   --  2.98* 2.31*  ?CALCIUM 8.3*   < > 8.1* 8.2* 8.5*  --   --  8.4* 8.4*  ?MG 1.7  --   --  1.7 1.8  --   --   --   --   ?PHOS 5.3*  5.3*  --   --  6.1* 5.8*  --   --  5.0*  --   ? < > = values in this interval not displayed.  ? ? ?GFR: ?Estimated Creatinine Clearance: 27 mL/min (A) (by C-G formula based on SCr of 2.31 mg/dL (H)). ? ?Liver Function Tests: ?Recent Labs  ?Lab 01/07/22 ?0651 01/07/22 ?1327 01/08/22 ?0159 01/09/22 ?7371 01/10/22 ?1316 01/12/22 ?0400  ?AST 99* 155* 131*  --   --  49*  ?ALT 174* 192* 192*  --   --  125*  ?ALKPHOS 178* 227* 218*  --   --  306*  ?BILITOT 0.5 0.7 0.4  --   --  0.4  ?PROT 6.3* 6.5 6.2*  --   --  6.0*  ?ALBUMIN 2.5* 2.5* 2.3* 2.3* 2.3* 2.0*  ? ? ?CBG: ?Recent Labs  ?Lab 01/11/22 ?1318 01/11/22 ?1613 01/11/22 ?1918 01/11/22 ?2042 01/12/22 ?0716  ?GLUCAP 155* 92 73 102* 234*  ? ? ? ?Recent  Results (from the past 240 hour(s))  ?MRSA Next Gen by PCR, Nasal     Status: None  ? Collection Time: 01/07/22  1:34 PM  ? Specimen: Nasal Mucosa; Nasal Swab  ?Result Value Ref Range Status  ? MRSA by PCR Next Gen NOT DETECTED NOT DETECTED Final  ?  Comment: (NOTE) ?The GeneXpert MRSA Assay (FDA approved for NASAL specimens only), ?is one component of a comprehensive MRSA colonization surveillance ?program. It is not intended to diagnose MRSA infection nor to guide ?or monitor treatment for MRSA infections. ?Test performance is not FDA approved in patients less than 2 years ?old. ?Performed at [facility], [place], ?[place] [address] ?  ?Culture, blood (Routine X 2) w Reflex to ID Panel     Status: None (Preliminary result)  ? Collection Time: 01/07/22  5:51 PM  ? Specimen: BLOOD  ?Result Value Ref Range Status  ? Specimen Description BLOOD RIGHT ANTECUBITAL  Final  ? Special Requests   Final  ?  BOTTLES DRAWN AEROBIC AND ANAEROBIC Blood Culture adequate volume  ? Culture   Final  ?  NO GROWTH 4 DAYS ?Performed at [facility], [address] [place], [place] [address] ?  ? Report Status PENDING  Incomplete  ?Culture, blood (Routine X 2) w Reflex to ID Panel     Status: None (Preliminary result)  ? Collection Time: 01/07/22  5:51 PM  ? Specimen: BLOOD  ?Result Value Ref Range Status  ? Specimen Description BLOOD BLOOD LEFT WRIST  Final  ? Special Requests   Final  ?  BOTTLES DRAWN AEROBIC AND ANAEROBIC Blood Culture adequate volume  ? Culture   Final  ?  NO GROWTH 4 DAYS ?Performed at [facility], [address], [place], [place] [address] ?  ? Report Status PENDING  Incomplete  ?  ? ? ? ? ? ?Radiology Studies: ?PERIPHERAL VASCULAR CATHETERIZATION ? ?Result Date: 01/10/2022 ?See surgical note for result. ? ?DG Chest Port 1 View ? ?Result Date: 01/10/2022 ?CLINICAL DATA:  Central line placement EXAM: PORTABLE CHEST 1 VIEW COMPARISON:  01/07/2022 FINDINGS: Dialysis  catheter from an internal jugular approach with tip at mid to low SVC. Patient rotated left. Mild cardiomegaly. Suspect tiny right pleural effusion. No pneumothorax. Resolved interstitial edema. Low lung volumes with mild

## 2022-01-12 NOTE — TOC Progression Note (Signed)
Transition of Care (TOC) - Progression Note  ? ? ?Patient Details  ?Name: Deanna [name] ?MRN: [id number] ?Date of Birth: 1951-01-18 ? ?Transition of Care (TOC) CM/SW Contact  ?Pete Pelt, RN ?Phone Number: ?01/12/2022, 11:40 AM ? ?Clinical Narrative:   Anticipated discharge is tomorrow after dialysis ? ? ? ?Expected Discharge Plan: [place] ?Barriers to Discharge: Continued Medical Work up ? ?Expected Discharge Plan and Services ?Expected Discharge Plan: [place] ?  ?Discharge Planning Services: CM Consult ?Post Acute Care Choice: Durable Medical Equipment, Home Health ?Living arrangements for the past 2 months: [place] ?                ?DME Arranged: 3-N-1 ?DME Agency: [employer] ?Date DME Agency Contacted: 12/29/21 ?Time DME Agency Contacted: [phone] ?Representative spoke with at DME Agency: Suanne Marker ?HH Arranged: OT, PT ?  ?  ?  ?  ? ? ?Social Determinants of Health (SDOH) Interventions ?  ? ?Readmission Risk Interventions ? ?  12/29/2021  ?  2:33 PM  ?Readmission Risk Prevention Plan  ?Transportation Screening Complete  ?PCP or Specialist Appt within 5-7 Days Complete  ?Home Care Screening Complete  ?Medication Review (RN CM) Complete  ? ? ?

## 2022-01-12 NOTE — Progress Notes (Addendum)
?[facility]  ?ROUNDING NOTE  ? ?Subjective:  ? ?Patient seen sitting at side of bed ?Currently eating breakfast, once complained of excessive appetite, Patient states this has resolved.  ?Ambulating in the hallway with assistance and walker ?Sitting up in chair during the day ? Denies shortness of breath ?Endorses increased mood and energy.  ?Denies pain from Permcath site ? ?Objective:  ?Vital signs in last 24 hours:  ?Temp:  [97.8 ?F (36.6 ?C)-98.5 ?F (36.9 ?C)] 98.5 ?F (36.9 ?C) (04/20 5329) ?Pulse Rate:  [84-96] 89 (04/20 0838) ?Resp:  [0-22] 18 (04/20 9242) ?BP: (126-169)/(58-143) 152/71 (04/20 6834) ?SpO2:  [95 %-99 %] 99 % (04/20 0838) ?Weight:  [83.5 kg] 83.5 kg (04/19 1204) ? ?Weight change:  ?Filed Weights  ? 01/09/22 1300 01/11/22 0853 01/11/22 1204  ?Weight: 82.5 kg 84.9 kg 83.5 kg  ? ? ?Intake/Output: ?I/O last 3 completed shifts: ?In: 272.1 [I.V.:72.1; IV Piggyback:200] ?Out: 500 [Other:500] ?  ?Intake/Output this shift: ? No intake/output data recorded. ? ?Physical Exam: ?General: NAD, resting in bed  ?Head: Normocephalic, atraumatic. Moist oral mucosal membranes  ?Eyes: Anicteric  ?Lungs:  Clear to auscultation, normal effort  ?Heart: Regular rate and rhythm  ?Abdomen:  Soft, nontender  ?Extremities: No peripheral edema.  ?Neurologic: Nonfocal, moving all four extremities  ?Skin: No lesions  ?Access: Rt IJ Permcath placed on 01/10/22   ? ? ?Basic Metabolic Panel: ?Recent Labs  ?Lab 01/06/22 ?0431 01/07/22 ?0651 01/07/22 ?1327 01/08/22 ?0159 01/09/22 ?1962 01/09/22 ?1034 01/09/22 ?1124 01/10/22 ?1316 01/12/22 ?0400  ?NA 135   < > 136 136 138  --   --  135 137  ?K 5.1   < > 5.3* 5.7* 5.9* 6.1* 4.6 5.1 4.3  ?CL 101   < > 99 98 97*  --   --  99 102  ?CO2 25   < > '27 29 29  '$ --   --  27 27  ?GLUCOSE 142*   < > 243* 172* 197*  --   --  258* 75  ?BUN 68*   < > 48* 62* 75*  --   --  56* 41*  ?CREATININE 2.88*   < > 2.34* 2.95* 3.50*  --   --  2.98* 2.31*  ?CALCIUM 8.3*   < > 8.1* 8.2* 8.5*  --    --  8.4* 8.4*  ?MG 1.7  --   --  1.7 1.8  --   --   --   --   ?PHOS 5.3*  5.3*  --   --  6.1* 5.8*  --   --  5.0*  --   ? < > = values in this interval not displayed.  ? ? ? ?Liver Function Tests: ?Recent Labs  ?Lab 01/07/22 ?0651 01/07/22 ?1327 01/08/22 ?0159 01/09/22 ?2297 01/10/22 ?1316 01/12/22 ?0400  ?AST 99* 155* 131*  --   --  49*  ?ALT 174* 192* 192*  --   --  125*  ?ALKPHOS 178* 227* 218*  --   --  306*  ?BILITOT 0.5 0.7 0.4  --   --  0.4  ?PROT 6.3* 6.5 6.2*  --   --  6.0*  ?ALBUMIN 2.5* 2.5* 2.3* 2.3* 2.3* 2.0*  ? ? ?No results for input(s): LIPASE, AMYLASE in the last 168 hours. ?No results for input(s): AMMONIA in the last 168 hours. ? ?CBC: ?Recent Labs  ?Lab 01/07/22 ?0651 01/07/22 ?1327 01/08/22 ?0159 01/09/22 ?9892 01/10/22 ?1316  ?WBC 20.4* 25.1* 15.0* 11.0* 8.2  ?  HGB 8.9* 8.7* 7.8* 7.0* 8.1*  ?HCT 27.4* 27.9* 24.9* 22.4* 26.3*  ?MCV 83.3 86.1 85.6 87.5 86.2  ?PLT 265 253 235 238 268  ? ? ? ?Cardiac Enzymes: ?No results for input(s): CKTOTAL, CKMB, CKMBINDEX, TROPONINI in the last 168 hours. ? ?BNP: ?Invalid input(s): POCBNP ? ?CBG: ?Recent Labs  ?Lab 01/11/22 ?1318 01/11/22 ?1613 01/11/22 ?1918 01/11/22 ?2042 01/12/22 ?0716  ?GLUCAP 155* 92 73 102* 234*  ? ? ? ?Microbiology: ?Results for orders placed or performed during the hospital encounter of 12/26/21  ?Respiratory (~20 pathogens) panel by PCR     Status: Abnormal  ? Collection Time: 12/27/21 11:32 AM  ? Specimen: Nasopharyngeal Swab; Respiratory  ?Result Value Ref Range Status  ? Adenovirus NOT DETECTED NOT DETECTED Final  ? Coronavirus 229E NOT DETECTED NOT DETECTED Final  ?  Comment: (NOTE) ?The Coronavirus on the Respiratory Panel, DOES NOT test for the novel  ?Coronavirus (2019 nCoV) ?  ? Coronavirus HKU1 NOT DETECTED NOT DETECTED Final  ? Coronavirus NL63 NOT DETECTED NOT DETECTED Final  ? Coronavirus OC43 NOT DETECTED NOT DETECTED Final  ? Metapneumovirus NOT DETECTED NOT DETECTED Final  ? Rhinovirus / Enterovirus NOT DETECTED NOT  DETECTED Final  ? Influenza A NOT DETECTED NOT DETECTED Final  ? Influenza B NOT DETECTED NOT DETECTED Final  ? Parainfluenza Virus 1 NOT DETECTED NOT DETECTED Final  ? Parainfluenza Virus 2 NOT DETECTED NOT DETECTED Final  ? Parainfluenza Virus 3 DETECTED (A) NOT DETECTED Final  ? Parainfluenza Virus 4 NOT DETECTED NOT DETECTED Final  ? Respiratory Syncytial Virus NOT DETECTED NOT DETECTED Final  ? Bordetella pertussis NOT DETECTED NOT DETECTED Final  ? Bordetella Parapertussis NOT DETECTED NOT DETECTED Final  ? Chlamydophila pneumoniae NOT DETECTED NOT DETECTED Final  ? Mycoplasma pneumoniae NOT DETECTED NOT DETECTED Final  ?  Comment: Performed at [facility], [place] [address], [place],  [address]  ?Resp Panel by RT-PCR (Flu A&B, Covid) Nasopharyngeal Swab     Status: None  ? Collection Time: 12/27/21 11:32 AM  ? Specimen: Nasopharyngeal Swab; Nasopharyngeal(NP) swabs in vial transport medium  ?Result Value Ref Range Status  ? SARS Coronavirus 2 by RT PCR NEGATIVE NEGATIVE Final  ?  Comment: (NOTE) ?SARS-CoV-2 target nucleic acids are NOT DETECTED. ? ?The SARS-CoV-2 RNA is generally detectable in upper respiratory ?specimens during the acute phase of infection. The lowest ?concentration of SARS-CoV-2 viral copies this assay can detect is ?138 copies/mL. A negative result does not preclude SARS-Cov-2 ?infection and should not be used as the sole basis for treatment or ?other patient management decisions. A negative result may occur with  ?improper specimen collection/handling, submission of specimen other ?than nasopharyngeal swab, presence of viral mutation(s) within the ?areas targeted by this assay, and inadequate number of viral ?copies(<138 copies/mL). A negative result must be combined with ?clinical observations, patient history, and epidemiological ?information. The expected result is Negative. ? ?Fact Sheet for Patients:  ?EntrepreneurPulse.com.au ? ?Fact Sheet for Healthcare  Providers:  ?IncredibleEmployment.be ? ?This test is no t yet approved or cleared by the [place] FDA and  ?has been authorized for detection and/or diagnosis of SARS-CoV-2 by ?FDA under an Emergency Use Authorization (EUA). This EUA will remain  ?in effect (meaning this test can be used) for the duration of the ?COVID-19 declaration under Section 564(b)(1) of the Act, 21 ?U.S.C.section 360bbb-3(b)(1), unless the authorization is terminated  ?or revoked sooner.  ? ? ?  ? Influenza A by PCR NEGATIVE  NEGATIVE Final  ? Influenza B by PCR NEGATIVE NEGATIVE Final  ?  Comment: (NOTE) ?The Xpert Xpress SARS-CoV-2/FLU/RSV plus assay is intended as an aid ?in the diagnosis of influenza from Nasopharyngeal swab specimens and ?should not be used as a sole basis for treatment. Nasal washings and ?aspirates are unacceptable for Xpert Xpress SARS-CoV-2/FLU/RSV ?testing. ? ?Fact Sheet for Patients: ?EntrepreneurPulse.com.au ? ?Fact Sheet for Healthcare Providers: ?IncredibleEmployment.be ? ?This test is not yet approved or cleared by the [place] FDA and ?has been authorized for detection and/or diagnosis of SARS-CoV-2 by ?FDA under an Emergency Use Authorization (EUA). This EUA will remain ?in effect (meaning this test can be used) for the duration of the ?COVID-19 declaration under Section 564(b)(1) of the Act, 21 U.S.C. ?section 360bbb-3(b)(1), unless the authorization is terminated or ?revoked. ? ?Performed at [facility], [place], ?[place] [address] ?  ?MRSA Next Gen by PCR, Nasal     Status: None  ? Collection Time: 01/07/22  1:34 PM  ? Specimen: Nasal Mucosa; Nasal Swab  ?Result Value Ref Range Status  ? MRSA by PCR Next Gen NOT DETECTED NOT DETECTED Final  ?  Comment: (NOTE) ?The GeneXpert MRSA Assay (FDA approved for NASAL specimens only), ?is one component of a comprehensive MRSA colonization surveillance ?program. It is not  intended to diagnose MRSA infection nor to guide ?or monitor treatment for MRSA infections. ?Test performance is not FDA approved in patients less than 2 years ?old. ?Performed at [facility], [place]

## 2022-01-12 NOTE — Progress Notes (Signed)
Following for outpatient hemodialysis placement. ?

## 2022-01-13 DIAGNOSIS — R531 Weakness: Secondary | ICD-10-CM | POA: Diagnosis not present

## 2022-01-13 DIAGNOSIS — N179 Acute kidney failure, unspecified: Secondary | ICD-10-CM | POA: Diagnosis not present

## 2022-01-13 DIAGNOSIS — N189 Chronic kidney disease, unspecified: Secondary | ICD-10-CM | POA: Diagnosis not present

## 2022-01-13 DIAGNOSIS — I639 Cerebral infarction, unspecified: Secondary | ICD-10-CM | POA: Diagnosis not present

## 2022-01-13 LAB — CBC
HCT: 26.3 % — ABNORMAL LOW (ref 36.0–46.0)
Hemoglobin: 8.1 g/dL — ABNORMAL LOW (ref 12.0–15.0)
MCH: 26.6 pg (ref 26.0–34.0)
MCHC: 30.8 g/dL (ref 30.0–36.0)
MCV: 86.5 fL (ref 80.0–100.0)
Platelets: 241 10*3/uL (ref 150–400)
RBC: 3.04 MIL/uL — ABNORMAL LOW (ref 3.87–5.11)
RDW: 15 % (ref 11.5–15.5)
WBC: 9 10*3/uL (ref 4.0–10.5)
nRBC: 0 % (ref 0.0–0.2)

## 2022-01-13 LAB — BASIC METABOLIC PANEL
Anion gap: 10 (ref 5–15)
BUN: 66 mg/dL — ABNORMAL HIGH (ref 8–23)
CO2: 26 mmol/L (ref 22–32)
Calcium: 8.3 mg/dL — ABNORMAL LOW (ref 8.9–10.3)
Chloride: 101 mmol/L (ref 98–111)
Creatinine, Ser: 2.89 mg/dL — ABNORMAL HIGH (ref 0.44–1.00)
GFR, Estimated: 17 mL/min — ABNORMAL LOW (ref >60)
Glucose, Bld: 56 mg/dL — ABNORMAL LOW (ref 70–99)
Potassium: 5.1 mmol/L (ref 3.5–5.1)
Sodium: 137 mmol/L (ref 135–145)

## 2022-01-13 LAB — GLUCOSE, CAPILLARY
Glucose-Capillary: 69 mg/dL — ABNORMAL LOW (ref 70–99)
Glucose-Capillary: 160 mg/dL — ABNORMAL HIGH (ref 70–99)
Glucose-Capillary: 181 mg/dL — ABNORMAL HIGH (ref 70–99)

## 2022-01-13 MED ORDER — CLOPIDOGREL BISULFATE 75 MG PO TABS: 75.0000 mg | ORAL_TABLET | Freq: Every day | ORAL | 0 refills | Status: DC

## 2022-01-13 MED ORDER — HEPARIN SODIUM (PORCINE) 1000 UNIT/ML IJ SOLN
INTRAMUSCULAR | Status: AC
  Filled 2022-01-13: qty 10

## 2022-01-13 MED ORDER — INSULIN GLARGINE 100 UNIT/ML ~~LOC~~ SOLN
20.0000 [IU] | Freq: Every day | SUBCUTANEOUS | 11 refills | Status: DC
Start: 2022-01-13 — End: 2022-01-16

## 2022-01-13 MED ORDER — METOPROLOL TARTRATE 25 MG PO TABS: 25.0000 mg | ORAL_TABLET | Freq: Two times a day (BID) | ORAL | 0 refills | Status: AC

## 2022-01-13 NOTE — NC FL2 (Signed)
?[place] MEDICAID FL2 LEVEL OF CARE SCREENING TOOL  ?  ? ?IDENTIFICATION  ?Patient Name: ?Deanna [name] Birthdate: 05-13-1951 Sex: female Admission Date (Current Location): ?12/26/2021  ?[place] and [place] Number: ? Deanna [name] ?  Facility and Address:  ?[facility], [address], [place], [place] [address] ?     Provider Number: ?[id number]  ?Attending Physician Name and Address:  ?Caren Griffins, MD ? Relative Name and Phone Number:  ?Forde Dandy (Granddaughter)   [phone] [facility]) ?   ?Current Level of Care: ?Hospital Recommended Level of Care: ?[place] Prior Approval Number: ?  ? ?Date Approved/Denied: ?  PASRR Number: ?  ? ?Discharge Plan: ?SNF ?  ? ?Current Diagnoses: ?Patient Active Problem List  ? Diagnosis Date Noted  ? Elevated troponin 01/08/2022  ? Hyperkalemia 01/08/2022  ? Acute right flank pain   ? Weakness of right lower extremity   ? Right sided numbness   ? CVA (cerebral vascular accident) ([place]) 12/28/2021  ? Hypokalemia 12/27/2021  ? Acute kidney injury superimposed on chronic kidney disease ([place]) 12/27/2021  ? Anemia 12/27/2021  ? Acute CVA (cerebrovascular accident) ([place]) 12/27/2021  ? Lobar pneumonia ([place]) 12/27/2021  ? Right sided weakness 12/26/2021  ? HFrEF (heart failure with reduced ejection fraction) ([place]) 10/31/2021  ? Angina of effort ([place]) 03/01/2016  ? Essential (primary) hypertension 06/24/2014  ? Type 2 diabetes mellitus without complications ([place]) 07/19/8526  ? ? ?Orientation RESPIRATION BLADDER Height & Weight   ?  ?Self, Time, Situation, Place ? Normal (100% on room air) Continent Weight: 81.1 kg ?Height:  '6\' 1"'$  (185.4 cm)  ?BEHAVIORAL SYMPTOMS/MOOD NEUROLOGICAL BOWEL NUTRITION STATUS  ?    Continent Diet  ?AMBULATORY STATUS COMMUNICATION OF NEEDS Skin   ?Limited Assist Verbally Skin abrasions (Blister Scratch Marks R groin dressed with guaze) ?  ?  ?  ?    ?     ?     ? ? ?Personal Care Assistance Level of Assistance   ?Bathing, Feeding, Dressing Bathing Assistance: Limited assistance ?Feeding assistance: Limited assistance ?Dressing Assistance: Limited assistance ?   ? ?Functional Limitations Info  ?Sight, Hearing, Speech Sight Info: Adequate ?Hearing Info: Adequate ?Speech Info: Adequate (Missing teeth)  ? ? ?SPECIAL CARE FACTORS FREQUENCY  ?PT (By licensed PT), OT (By licensed OT)   ?  ?PT Frequency: Min 5x weekly ?OT Frequency: Min 5x weekly ?  ?  ?  ?   ? ? ?Contractures Contractures Info: Not present  ? ? ?Additional Factors Info  ?Code Status, Allergies Code Status Info: FULL ?Allergies Info: Tomato, penicillins ?  ?  ?  ?   ? ?Current Medications (01/13/2022):  This is the current hospital active medication list ?Current Facility-Administered Medications  ?Medication Dose Route Frequency Provider Last Rate Last Admin  ? 0.9 %  sodium chloride infusion (Manually program via Guardrails IV Fluids)   Intravenous Once Max Sane, MD      ? 0.9 %  sodium chloride infusion   Intravenous PRN Max Sane, MD   Stopped at 01/11/22 2003  ? aspirin EC tablet 81 mg  81 mg Oral Daily Algernon Huxley, MD   81 mg at 01/12/22 7824  ? benzonatate (TESSALON) capsule 200 mg  200 mg Oral TID PRN Algernon Huxley, MD   200 mg at 01/04/22 1024  ? Chlorhexidine Gluconate Cloth 2 % PADS 6 each  6 each Topical Q0600 Colon Flattery, NP   6 each at 01/13/22 2353  ? clopidogrel (PLAVIX)  tablet 75 mg  75 mg Oral Daily Algernon Huxley, MD   75 mg at 01/12/22 3383  ? epoetin alfa (EPOGEN) injection 20,000 Units  20,000 Units Subcutaneous Weekly Holley Raring, [place], MD   20,000 Units at 01/12/22 1500  ? feeding supplement (NEPRO CARB STEADY) liquid 237 mL  237 mL Oral QHS Algernon Huxley, MD   237 mL at 01/12/22 2208  ? gabapentin (NEURONTIN) capsule 300 mg  300 mg Oral QHS Algernon Huxley, MD   300 mg at 01/12/22 2206  ? guaiFENesin-dextromethorphan (ROBITUSSIN DM) 100-10 MG/5ML syrup 5 mL  5 mL Oral Q4H PRN Algernon Huxley, MD   5 mL at 12/30/21 0848  ? HYDROmorphone  (DILAUDID) injection 1 mg  1 mg Intravenous Once PRN Kris Hartmann, NP      ? insulin aspart (novoLOG) injection 0-5 Units  0-5 Units Subcutaneous QHS Algernon Huxley, MD   4 Units at 01/03/22 2200  ? insulin aspart (novoLOG) injection 0-6 Units  0-6 Units Subcutaneous TID WC Gilles Chiquito B, MD      ? insulin aspart (novoLOG) injection 2 Units  2 Units Subcutaneous TID WC Sid Falcon, MD      ? insulin glargine-yfgn (SEMGLEE) injection 30 Units  30 Units Subcutaneous Daily Gilles Chiquito B, MD      ? metoprolol tartrate (LOPRESSOR) injection 2.5 mg  2.5 mg Intravenous Q4H PRN Caren Griffins, MD      ? metoprolol tartrate (LOPRESSOR) tablet 25 mg  25 mg Oral BID Tristan Schroeder, PA-C   25 mg at 01/12/22 2206  ? multivitamin (RENA-VIT) tablet 1 tablet  1 tablet Oral QHS Caren Griffins, MD   1 tablet at 01/12/22 2207  ? ondansetron (ZOFRAN) injection 4 mg  4 mg Intravenous Q6H PRN Kris Hartmann, NP      ? oxyCODONE (Oxy IR/ROXICODONE) immediate release tablet 10 mg  10 mg Oral Q6H PRN Max Sane, MD   5 mg at 01/10/22 2003  ? sacubitril-valsartan (ENTRESTO) 24-26 mg per tablet  1 tablet Oral BID Murlean Iba, MD   1 tablet at 01/12/22 2207  ? sodium zirconium cyclosilicate (LOKELMA) packet 10 g  10 g Oral Daily Murlean Iba, MD   10 g at 01/12/22 1022  ? ? ? ?Discharge Medications: ?Please see discharge summary for a list of discharge medications. ? ?Relevant Imaging Results: ? ?Relevant Lab Results: ? ? ?Additional Information ?SSN [id number]  Dialysis patient ? ?Pete Pelt, RN ? ? ? ? ?

## 2022-01-13 NOTE — Progress Notes (Signed)
Hemodialysis Post Treatment Note ? ?Patient completes scheduled 3-hour treatment without incident. CVC without signs of infection. Prescribed BFR unable to be maintained due to elevated arterial pressure.Targeted UF met with 536 ml fluid removed. Report given to charge nurse.  ?

## 2022-01-13 NOTE — Progress Notes (Addendum)
?[facility]  ?ROUNDING NOTE  ? ?Subjective:  ? ?Patient seen and evaluated during dialysis ?  ?HEMODIALYSIS FLOWSHEET: ? ?Blood Flow Rate (mL/min): 350 mL/min ?Arterial Pressure (mmHg): -140 mmHg ?Venous Pressure (mmHg): 140 mmHg ?Transmembrane Pressure (mmHg): 50 mmHg ?Ultrafiltration Rate (mL/min): 340 mL/min ?Dialysate Flow Rate (mL/min): 600 ml/min ?Conductivity: Machine : 13.7 ?Conductivity: Machine : 13.7 ?Dialysis Fluid Bolus: Normal Saline ?Bolus Amount (mL): 250 mL ?Dialysate Change: 2K ? ?Patient seated in chair, tolerating well ?Patient states overall her appetite is better.  No shortness of breath ? ?Objective:  ?Vital signs in last 24 hours:  ?Temp:  [97.6 ?F (36.4 ?C)-98.5 ?F (36.9 ?C)] 98.5 ?F (36.9 ?C) (04/21 6237) ?Pulse Rate:  [85-97] 86 (04/21 1230) ?Resp:  [15-27] 19 (04/21 1230) ?BP: (131-163)/(64-80) 143/69 (04/21 1145) ?SpO2:  [98 %-100 %] 99 % (04/21 1230) ?Weight:  [81.1 kg] 81.1 kg (04/21 0845) ? ?Weight change:  ?Filed Weights  ? 01/11/22 0853 01/11/22 1204 01/13/22 0845  ?Weight: 84.9 kg 83.5 kg 81.1 kg  ? ? ?Intake/Output: ?I/O last 3 completed shifts: ?In: 38.7 [I.V.:38.7] ?Out: -  ?  ?Intake/Output this shift: ? No intake/output data recorded. ? ?Physical Exam: ?General: NAD,   ?Head: Normocephalic, atraumatic. Moist oral mucosal membranes  ?Eyes: Anicteric  ?Lungs:  Clear to auscultation, normal effort  ?Heart: Regular rate and rhythm  ?Abdomen:  Soft, nontender  ?Extremities: No peripheral edema.  ?Neurologic: Nonfocal, moving all four extremities  ?Skin: No lesions  ?Access: Rt IJ Permcath placed on 01/10/22   ? ? ?Basic Metabolic Panel: ?Recent Labs  ?Lab 01/08/22 ?0159 01/09/22 ?6283 01/09/22 ?1034 01/09/22 ?1124 01/10/22 ?1316 01/12/22 ?0400 01/13/22 ?1517  ?NA 136 138  --   --  135 137 137  ?K 5.7* 5.9* 6.1* 4.6 5.1 4.3 5.1  ?CL 98 97*  --   --  99 102 101  ?CO2 29 29  --   --  '27 27 26  '$ ?GLUCOSE 172* 197*  --   --  258* 75 56*  ?BUN 62* 75*  --   --  56* 41* 66*   ?CREATININE 2.95* 3.50*  --   --  2.98* 2.31* 2.89*  ?CALCIUM 8.2* 8.5*  --   --  8.4* 8.4* 8.3*  ?MG 1.7 1.8  --   --   --   --   --   ?PHOS 6.1* 5.8*  --   --  5.0*  --   --   ? ? ? ?Liver Function Tests: ?Recent Labs  ?Lab 01/07/22 ?0651 01/07/22 ?1327 01/08/22 ?0159 01/09/22 ?6160 01/10/22 ?1316 01/12/22 ?0400  ?AST 99* 155* 131*  --   --  49*  ?ALT 174* 192* 192*  --   --  125*  ?ALKPHOS 178* 227* 218*  --   --  306*  ?BILITOT 0.5 0.7 0.4  --   --  0.4  ?PROT 6.3* 6.5 6.2*  --   --  6.0*  ?ALBUMIN 2.5* 2.5* 2.3* 2.3* 2.3* 2.0*  ? ? ?No results for input(s): LIPASE, AMYLASE in the last 168 hours. ?No results for input(s): AMMONIA in the last 168 hours. ? ?CBC: ?Recent Labs  ?Lab 01/07/22 ?1327 01/08/22 ?0159 01/09/22 ?7371 01/10/22 ?1316 01/13/22 ?0626  ?WBC 25.1* 15.0* 11.0* 8.2 9.0  ?HGB 8.7* 7.8* 7.0* 8.1* 8.1*  ?HCT 27.9* 24.9* 22.4* 26.3* 26.3*  ?MCV 86.1 85.6 87.5 86.2 86.5  ?PLT 253 235 238 268 241  ? ? ? ?Cardiac Enzymes: ?No results for input(s): CKTOTAL,  CKMB, CKMBINDEX, TROPONINI in the last 168 hours. ? ?BNP: ?Invalid input(s): POCBNP ? ?CBG: ?Recent Labs  ?Lab 01/12/22 ?1614 01/12/22 ?2039 01/12/22 ?2213 01/13/22 ?6270 01/13/22 ?0801  ?GLUCAP 94 100* 66* 69* 160*  ? ? ? ?Microbiology: ?Results for orders placed or performed during the hospital encounter of 12/26/21  ?Respiratory (~20 pathogens) panel by PCR     Status: Abnormal  ? Collection Time: 12/27/21 11:32 AM  ? Specimen: Nasopharyngeal Swab; Respiratory  ?Result Value Ref Range Status  ? Adenovirus NOT DETECTED NOT DETECTED Final  ? Coronavirus 229E NOT DETECTED NOT DETECTED Final  ?  Comment: (NOTE) ?The Coronavirus on the Respiratory Panel, DOES NOT test for the novel  ?Coronavirus (2019 nCoV) ?  ? Coronavirus HKU1 NOT DETECTED NOT DETECTED Final  ? Coronavirus NL63 NOT DETECTED NOT DETECTED Final  ? Coronavirus OC43 NOT DETECTED NOT DETECTED Final  ? Metapneumovirus NOT DETECTED NOT DETECTED Final  ? Rhinovirus / Enterovirus NOT DETECTED  NOT DETECTED Final  ? Influenza A NOT DETECTED NOT DETECTED Final  ? Influenza B NOT DETECTED NOT DETECTED Final  ? Parainfluenza Virus 1 NOT DETECTED NOT DETECTED Final  ? Parainfluenza Virus 2 NOT DETECTED NOT DETECTED Final  ? Parainfluenza Virus 3 DETECTED (A) NOT DETECTED Final  ? Parainfluenza Virus 4 NOT DETECTED NOT DETECTED Final  ? Respiratory Syncytial Virus NOT DETECTED NOT DETECTED Final  ? Bordetella pertussis NOT DETECTED NOT DETECTED Final  ? Bordetella Parapertussis NOT DETECTED NOT DETECTED Final  ? Chlamydophila pneumoniae NOT DETECTED NOT DETECTED Final  ? Mycoplasma pneumoniae NOT DETECTED NOT DETECTED Final  ?  Comment: Performed at [facility], [place] [address] [place], [place] [address]  ?Resp Panel by RT-PCR (Flu A&B, Covid) Nasopharyngeal Swab     Status: None  ? Collection Time: 12/27/21 11:32 AM  ? Specimen: Nasopharyngeal Swab; Nasopharyngeal(NP) swabs in vial transport medium  ?Result Value Ref Range Status  ? SARS Coronavirus 2 by RT PCR NEGATIVE NEGATIVE Final  ?  Comment: (NOTE) ?SARS-CoV-2 target nucleic acids are NOT DETECTED. ? ?The SARS-CoV-2 RNA is generally detectable in upper respiratory ?specimens during the acute phase of infection. The lowest ?concentration of SARS-CoV-2 viral copies this assay can detect is ?138 copies/mL. A negative result does not preclude SARS-Cov-2 ?infection and should not be used as the sole basis for treatment or ?other patient management decisions. A negative result may occur with  ?improper specimen collection/handling, submission of specimen other ?than nasopharyngeal swab, presence of viral mutation(s) within the ?areas targeted by this assay, and inadequate number of viral ?copies(<138 copies/mL). A negative result must be combined with ?clinical observations, patient history, and epidemiological ?information. The expected result is Negative. ? ?Fact Sheet for Patients:  ?EntrepreneurPulse.com.au ? ?Fact Sheet for  Healthcare Providers:  ?IncredibleEmployment.be ? ?This test is no t yet approved or cleared by the [place] FDA and  ?has been authorized for detection and/or diagnosis of SARS-CoV-2 by ?FDA under an Emergency Use Authorization (EUA). This EUA will remain  ?in effect (meaning this test can be used) for the duration of the ?COVID-19 declaration under Section 564(b)(1) of the Act, 21 ?U.S.C.section 360bbb-3(b)(1), unless the authorization is terminated  ?or revoked sooner.  ? ? ?  ? Influenza A by PCR NEGATIVE NEGATIVE Final  ? Influenza B by PCR NEGATIVE NEGATIVE Final  ?  Comment: (NOTE) ?The Xpert Xpress SARS-CoV-2/FLU/RSV plus assay is intended as an aid ?in the diagnosis of influenza from Nasopharyngeal swab specimens and ?should  not be used as a sole basis for treatment. Nasal washings and ?aspirates are unacceptable for Xpert Xpress SARS-CoV-2/FLU/RSV ?testing. ? ?Fact Sheet for Patients: ?EntrepreneurPulse.com.au ? ?Fact Sheet for Healthcare Providers: ?IncredibleEmployment.be ? ?This test is not yet approved or cleared by the [place] FDA and ?has been authorized for detection and/or diagnosis of SARS-CoV-2 by ?FDA under an Emergency Use Authorization (EUA). This EUA will remain ?in effect (meaning this test can be used) for the duration of the ?COVID-19 declaration under Section 564(b)(1) of the Act, 21 U.S.C. ?section 360bbb-3(b)(1), unless the authorization is terminated or ?revoked. ? ?Performed at [facility], [place], ?[place] [address] ?  ?MRSA Next Gen by PCR, Nasal     Status: None  ? Collection Time: 01/07/22  1:34 PM  ? Specimen: Nasal Mucosa; Nasal Swab  ?Result Value Ref Range Status  ? MRSA by PCR Next Gen NOT DETECTED NOT DETECTED Final  ?  Comment: (NOTE) ?The GeneXpert MRSA Assay (FDA approved for NASAL specimens only), ?is one component of a comprehensive MRSA colonization surveillance ?program. It is  not intended to diagnose MRSA infection nor to guide ?or monitor treatment for MRSA infections. ?Test performance is not FDA approved in patients less than 2 years ?old. ?Performed at [facility], 12

## 2022-01-13 NOTE — Progress Notes (Signed)
VAST RN received consult to de-access port. Upon reviewing chart, noted patient does not have a port, but a HD catheter. Upon arrival to patient's bedside, noted that [facility] was already capped and heparinized. Asked patient what she needed; she explained her groin [place] was removed on Monday and she wanted the dressing removed (she was told it would be removed Tuesday after 24 hrs). Utilized adhesive remover to take tape and dressing down. In the process, noted a large blood blister which was oozing. Cruzita Lederer, MD came into room to discharge patient. Patient stated she was not ready to be discharged. He visualized and verified blood blister will go away on it's own, but it will take some time due to it's size. Area dressing with smaller gauze and tape. Charge nurse notified.  ?

## 2022-01-13 NOTE — Progress Notes (Signed)
Placement resolved: [facility] 11am, start date 4/25 at 10:50. Confirmed with granddaughter that she will be able to transport patient to treatments. ?

## 2022-01-13 NOTE — NC FL2 (Signed)
?[place] MEDICAID FL2 LEVEL OF CARE SCREENING TOOL  ?  ? ?IDENTIFICATION  ?Patient Name: ?Deanna [name] Birthdate: May 05, 1951 Sex: female Admission Date (Current Location): ?12/26/2021  ?[place] and [place] Number: ? [name] ?  Facility and Address:  ?[facility], [address], [place], [place] [address] ?     Provider Number: ?[id number]  ?Attending Physician Name and Address:  ?Deanna Griffins, [name] ? Relative Name and Phone Number:  ?Deanna [name] (Granddaughter)   [phone] [facility]) ?   ?Current Level of Care: ?Hospital Recommended Level of Care: ?[place] Prior Approval Number: ?  ? ?Date Approved/Denied: ?  PASRR Number: ?[id number] A ? ?Discharge Plan: ?SNF ?  ? ?Current Diagnoses: ?Patient Active Problem List  ? Diagnosis Date Noted  ? Elevated troponin 01/08/2022  ? Hyperkalemia 01/08/2022  ? Acute right flank pain   ? Weakness of right lower extremity   ? Right sided numbness   ? CVA (cerebral vascular accident) ([place]) 12/28/2021  ? Hypokalemia 12/27/2021  ? Acute kidney injury superimposed on chronic kidney disease ([place]) 12/27/2021  ? Anemia 12/27/2021  ? Acute CVA (cerebrovascular accident) ([place]) 12/27/2021  ? Lobar pneumonia ([place]) 12/27/2021  ? Right sided weakness 12/26/2021  ? HFrEF (heart failure with reduced ejection fraction) ([place]) 10/31/2021  ? Angina of effort ([place]) 03/01/2016  ? Essential (primary) hypertension 06/24/2014  ? Type 2 diabetes mellitus without complications ([place]) 21/19/4174  ? ? ?Orientation RESPIRATION BLADDER Height & Weight   ?  ?Self, Time, Situation, Place ? Normal (100% on room air) Continent Weight: 81.1 kg ?Height:  '6\' 1"'$  (185.4 cm)  ?BEHAVIORAL SYMPTOMS/MOOD NEUROLOGICAL BOWEL NUTRITION STATUS  ?    Continent Diet  ?AMBULATORY STATUS COMMUNICATION OF NEEDS Skin   ?Limited Assist Verbally Skin abrasions (Blister Scratch Marks R groin dressed with guaze) ?  ?  ?  ?    ?     ?     ? ? ?Personal Care Assistance Level of  Assistance  ?Bathing, Feeding, Dressing Bathing Assistance: Limited assistance ?Feeding assistance: Limited assistance ?Dressing Assistance: Limited assistance ?   ? ?Functional Limitations Info  ?Sight, Hearing, Speech Sight Info: Adequate ?Hearing Info: Adequate ?Speech Info: Adequate (Missing teeth)  ? ? ?SPECIAL CARE FACTORS FREQUENCY  ?PT (By licensed PT), OT (By licensed OT)   ?  ?PT Frequency: Min 5x weekly ?OT Frequency: Min 5x weekly ?  ?  ?  ?   ? ? ?Contractures Contractures Info: Not present  ? ? ?Additional Factors Info  ?Code Status, Allergies Code Status Info: FULL ?Allergies Info: Tomato, penicillins ?  ?  ?  ?   ? ?Current Medications (01/13/2022):  This is the current hospital active medication list ?Current Facility-Administered Medications  ?Medication Dose Route Frequency Provider Last Rate Last Admin  ? 0.9 %  sodium chloride infusion (Manually program via Guardrails IV Fluids)   Intravenous Once Deanna Sane, [name]      ? 0.9 %  sodium chloride infusion   Intravenous PRN Deanna Sane, [name]   Stopped at 01/11/22 2003  ? aspirin EC tablet 81 mg  81 mg Oral Daily Deanna Huxley, [name]   81 mg at 01/12/22 0814  ? benzonatate (TESSALON) capsule 200 mg  200 mg Oral TID PRN Deanna Huxley, [name]   200 mg at 01/04/22 1024  ? Chlorhexidine Gluconate Cloth 2 % PADS 6 each  6 each Topical Q0600 Deanna Flattery, NP   6 each at 01/13/22 4818  ? clopidogrel (PLAVIX) tablet  75 mg  75 mg Oral Daily Deanna Huxley, [name]   75 mg at 01/12/22 7017  ? epoetin alfa (EPOGEN) injection 20,000 Units  20,000 Units Subcutaneous Weekly Deanna [name], Deanna [name]   20,000 Units at 01/12/22 1500  ? feeding supplement (NEPRO CARB STEADY) liquid 237 mL  237 mL Oral QHS Deanna Huxley, [name]   237 mL at 01/12/22 2208  ? gabapentin (NEURONTIN) capsule 300 mg  300 mg Oral QHS Deanna Huxley, [name]   300 mg at 01/12/22 2206  ? guaiFENesin-dextromethorphan (ROBITUSSIN DM) 100-10 MG/5ML syrup 5 mL  5 mL Oral Q4H PRN Deanna Huxley, [name]   5 mL at 12/30/21 0848  ?  HYDROmorphone (DILAUDID) injection 1 mg  1 mg Intravenous Once PRN Deanna Hartmann, NP      ? insulin aspart (novoLOG) injection 0-5 Units  0-5 Units Subcutaneous QHS Deanna Huxley, [name]   4 Units at 01/03/22 2200  ? insulin aspart (novoLOG) injection 0-6 Units  0-6 Units Subcutaneous TID WC Deanna Chiquito [name], [name]      ? insulin aspart (novoLOG) injection 2 Units  2 Units Subcutaneous TID WC Deanna Falcon, [name]      ? insulin glargine-yfgn (SEMGLEE) injection 30 Units  30 Units Subcutaneous Daily Deanna Chiquito [name], [name]      ? metoprolol tartrate (LOPRESSOR) injection 2.5 mg  2.5 mg Intravenous Q4H PRN Deanna Griffins, [name]      ? metoprolol tartrate (LOPRESSOR) tablet 25 mg  25 mg Oral BID Deanna Schroeder, PA-C   25 mg at 01/12/22 2206  ? multivitamin (RENA-VIT) tablet 1 tablet  1 tablet Oral QHS Deanna Griffins, [name]   1 tablet at 01/12/22 2207  ? ondansetron (ZOFRAN) injection 4 mg  4 mg Intravenous Q6H PRN Deanna Hartmann, NP      ? oxyCODONE (Oxy IR/ROXICODONE) immediate release tablet 10 mg  10 mg Oral Q6H PRN Deanna Sane, [name]   5 mg at 01/10/22 2003  ? sacubitril-valsartan (ENTRESTO) 24-26 mg per tablet  1 tablet Oral BID Deanna Iba, [name]   1 tablet at 01/12/22 2207  ? sodium zirconium cyclosilicate (LOKELMA) packet 10 g  10 g Oral Daily Deanna Iba, [name]   10 g at 01/12/22 1022  ? ? ? ?Discharge Medications: ?Please see discharge summary for a list of discharge medications. ? ?Relevant Imaging Results: ? ?Relevant Lab Results: ? ? ?Additional Information ?SSN [id number]  Dialysis patient ? ?Pete Pelt, RN ? ? ? ? ?

## 2022-01-13 NOTE — TOC Progression Note (Signed)
Transition of Care (TOC) - Progression Note  ? ? ?Patient Details  ?Name: Deanna [name] ?MRN: [id number] ?Date of Birth: 12-11-1950 ? ?Transition of Care (TOC) CM/SW Contact  ?Pete Pelt, RN ?Phone Number: ?01/13/2022, 3:58 PM ? ?Clinical Narrative:   Patient informed MD and other staff police and sheriff were at her home and she could not go home. ?When RNCM entered room, patient says she did not tell anyone that police were at her home, she feels safe and she does not want to go anywhere but home. ? ?Granddaughter [name] states that there were no police at her residence and she has discussed taking patient home with patient's family and all are in agreement. ? ?Patient and granddaughter agree that patient is safe at home.  Granddaughter coming to get patient. ? ? ?Expected Discharge Plan: [place] ?Barriers to Discharge: Continued Medical Work up ? ?Expected Discharge Plan and Services ?Expected Discharge Plan: [place] ?  ?Discharge Planning Services: CM Consult ?Post Acute Care Choice: Durable Medical Equipment, Home Health ?Living arrangements for the past 2 months: [place] ?Expected Discharge Date: 01/13/22               ?DME Arranged: 3-N-1 ?DME Agency: [employer] ?Date DME Agency Contacted: 12/29/21 ?Time DME Agency Contacted: [phone] ?Representative spoke with at DME Agency: Suanne Marker ?HH Arranged: OT, PT ?  ?  ?  ?  ? ? ?Social Determinants of Health (SDOH) Interventions ?  ? ?Readmission Risk Interventions ? ?  12/29/2021  ?  2:33 PM  ?Readmission Risk Prevention Plan  ?Transportation Screening Complete  ?PCP or Specialist Appt within 5-7 Days Complete  ?Home Care Screening Complete  ?Medication Review (RN CM) Complete  ? ? ?

## 2022-01-13 NOTE — Discharge Summary (Addendum)
? ?Physician Discharge Summary  ?Deanna [name] YPP:[id number] DOB: June 19, 1951 DOA: 12/26/2021 ? ?PCP: [facility] ? ?Admit date: 12/26/2021 ?Discharge date: 01/13/2022 ? ?Admitted From: home ?Disposition:  home (declines SNF) ? ?Recommendations for Outpatient Follow-up:  ?Follow up with PCP in 1-2 weeks ?Now on HD TTS ? ?Home Health: PT, OT ?Equipment/Devices: none ? ?Discharge Condition: stable ?CODE STATUS: Full code ?Diet recommendation: renal, heart healthy ? ?HPI: Per admitting MD, ?Deanna [name] is a 71 y.o. female with medical history significant of hypertension, CAD with recent stents presenting with right-sided weakness, worse since yesterday evening.  Daughter states that she thinks that after having the cath in the right arm she started having weakness in the right arm discussed with her that usually cardiac catheterization does not cause weakness she also has weakness in her right leg.  Patient is otherwise unable to tell me any other details but denies any complaints and does not report any headaches blurred vision speech or chest pain/ palpitation / abd pain fevers chills or any other complaints. In the emergency room today patient hemoglobin of 10.2, otherwise normal CBC, CMP shows potassium of 3.2 glucose 117 creatinine of 3.84 with a GFR of 12, troponin of 22 with repeat  of 24, patient's BNP is 3.0, CBC shows a hemoglobin of 10.0 and otherwise normal white count and platelet count. ? ?Hospital Course / Discharge diagnoses: ?Principal Problem: ?  Right sided weakness ?Active Problems: ?  Acute CVA (cerebrovascular accident) ([place]) ?  Acute kidney injury superimposed on chronic kidney disease ([place]) ?  Lobar pneumonia ([place]) ?  Essential (primary) hypertension ?  HFrEF (heart failure with reduced ejection fraction) ([place]) ?  Type 2 diabetes mellitus without complications ([place]) ?  Hypokalemia ?  Anemia ?  CVA (cerebral vascular accident) [facility]) ?  Acute right flank pain ?   Weakness of right lower extremity ?  Right sided numbness ?  Elevated troponin ?  Hyperkalemia ? ? ?Assessment and Plan: ?Acute kidney injury superimposed on chronic kidney disease, now with ESRD, catheter in place and HD started ?Hyperphosphatemia ?Secondary hyperparathyroidism - Being managed by nephrology.  Patient started on dialysis while hospitalized, and appears to have remained dialysis dependent.  An outpatient dialysis spot has been secured on a TTS schedule.  Last underwent dialysis on the day of discharge, 4/21.  She has a tunneled catheter in place, will need outpatient follow-up with vascular surgery for more permanent access ?  ?Right sided weakness- Severe right sided C5-C6 foraminal stenosis (MRI from 12/28/21).  Improved, continue PT ?Essential (primary) hypertension -resume home medications ?HFrEF (heart failure with reduced ejection fraction), chronic -Resume home medications  ?Type 2 diabetes mellitus without complications (H[facility]) -decrease home insulin coverage now that she is on dialysis.   ?Anemia, normocytic, chronic- No active bleeding, no transfusion needed at this time, no SOB or chest pain ?Acute CVA (cerebrovascular accident) ?Punctate pericallosal white matter infarct - Continue aspirin/plavix for 21 days, 1 more week remaining on discharge then she is to continue aspirin alone   ?Lobar pneumonia ?Parainfluenza virus - CXR 4/15 - patchy infiltrate, pulmonary edema.  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 Would have outpatient ID consult for this ?Elevated troponin - down trending, no symptoms ?Hyperkalemia - resolved ?Hypokalemia - resolved ? ?Sepsis ruled out ? ? ?Discharge Instructions ? ? ?Allergies as of 01/13/2022   ? ?  Reactions  ? Tomato   ? Penicillins Rash  ? ?  ? ?  ?Medication List  ?  ? ?STOP taking these medications   ? ?carvedilol 12.5 MG  tablet ?Commonly known as: COREG ?  ?empagliflozin 10 MG Tabs tablet ?Commonly known as: JARDIANCE ?  ?metFORMIN 500 MG tablet ?Commonly known as: GLUCOPHAGE ?  ? ?  ? ?TAKE these medications   ? ?aspirin EC 81 MG tablet ?Take 81 mg by mouth daily. ?  ?atorvastatin 40 MG tablet ?Commonly known as: LIPITOR ?Take 40 mg by mouth daily. ?  ?clopidogrel 75 MG tablet ?Commonly known as: PLAVIX ?Take 1 tablet (75 mg total) by mouth daily for 7 days. ?  ?Entresto 24-26 MG ?Generic drug: sacubitril-valsartan ?Take 1 tablet by mouth 2 (two) times daily. ?  ?gabapentin 100 MG capsule ?Commonly known as: NEURONTIN ?Take 300 mg by mouth. ?  ?insulin glargine 100 UNIT/ML injection ?Commonly known as: LANTUS ?Inject 0.2 mLs (20 Units total) into the skin at bedtime. ?What changed: how much to take ?  ?metoprolol tartrate 25 MG tablet ?Commonly known as: LOPRESSOR ?Take 1 tablet (25 mg total) by mouth 2 (two) times daily. ?  ? ?  ? ?  ?  ? ? ?  ?Durable Medical Equipment  ?(From admission, onward)  ?  ? ? ?  ? ?  Start     Ordered  ? 12/29/21 1454  For home use only DME Walker rolling  Once       ?Question Answer Comment  ?Walker: With 5 Inch Wheels   ?Patient needs a walker to treat with the following condition Impaired ambulation   ?  ? 12/29/21 1453  ? 12/29/21 1454  For home use only DME 3 n 1  Once       ? 12/29/21 1453  ? ?  ?  ? ?  ? ? ? ?Consultations: ?Neurology  ?Spine surgery ?Vascular surgery  ?Nephrology  ?PCCM ? ?Procedures/Studies: ? ?CT HEAD WO CONTRAST ? ?Result Date: 12/26/2021 ?CLINICAL DATA:  Intermittent numbness, weakness, and dizziness. Unable to lift right arm. EXAM: CT HEAD WITHOUT CONTRAST TECHNIQUE: Contiguous axial images were obtained from the base of the skull through the vertex without intravenous contrast. RADIATION DOSE REDUCTION: This exam was performed according to the departmental dose-optimization program which includes automated exposure control, adjustment of the mA and/or kV according to  patient size and/or use of iterative reconstruction technique. COMPARISON:  None. FINDINGS: Brain: No evidence of acute infarction, hemorrhage, hydrocephalus, extra-axial collection or mass lesion/mass effect. Cerebral volume within normal limits for age. Scattered mild periventricular and subcortical white matter hypodensities are nonspecific, but favored to reflect chronic microvascular ischemic changes. Vascular: Atherosclerotic vascular calcification of the carotid siphons. No hyperdense vessel. Skull: Normal. Negative for fracture or focal lesion. Sinuses/Orbits: No acute finding. Scattered mild paranasal sinus mucosal thickening. The orbits are unremarkable. Other: None. IMPRESSION: 1. No acute intracranial abnormality. 2. Mild chronic microvascular ischemic changes. Electronically Signed   By: Titus Dubin M.D.   On: 12/26/2021 16:52  ? ?MR ANGIO HEAD WO CONTRAST ? ?Result Date: 12/28/2021 ?CLINICAL DATA:  Stroke follow-up EXAM: MRA HEAD WITHOUT CONTRAST TECHNIQUE: Angiographic images of the Circle of Willis were acquired using MRA technique without intravenous contrast. COMPARISON:  No pertinent prior exam. FINDINGS: POSTERIOR CIRCULATION: --Vertebral arteries: Normal --Inferior cerebellar arteries: Normal. --Basilar artery: Normal. --Superior cerebellar arteries: Normal. --Posterior cerebral arteries: Normal. ANTERIOR CIRCULATION: --Intracranial internal carotid arteries: Normal. --Anterior cerebral arteries (ACA): Normal. --Middle  cerebral arteries (MCA): Normal. ANATOMIC VARIANTS: None Other: None. IMPRESSION: Normal intracranial MRA. Electronically Signed   By: Ulyses Jarred M.D.   On: 12/28/2021 02:10  ? ?MR Brain Wo Contrast (neuro protocol) ? ?Result Date: 12/26/2021 ?CLINICAL DATA:  Acute neurologic deficit EXAM: MRI HEAD WITHOUT CONTRAST TECHNIQUE: Multiplanar, multiecho pulse sequences of the brain and surrounding structures were obtained without intravenous contrast. COMPARISON:  None. FINDINGS:  Brain: Punctate focus of reduced diffusion within the superior right pericallosal white matter. No other diffusion abnormality. No acute or chronic hemorrhage. Hyperintense T2-weighted signal is moderately widespread throughout

## 2022-01-13 NOTE — TOC Progression Note (Addendum)
Transition of Care (TOC) - Progression Note  ? ? ?Patient Details  ?Name: Deanna [name] ?MRN: [id number] ?Date of Birth: [birth year] ? ?Transition of Care (TOC) CM/SW Contact  ?Pete Pelt, RN ?Phone Number: ?01/13/2022, 9:51 AM ? ?Clinical Narrative:   RNCM spoke to patient's granddaughter, [name], who states that she is patient's caregiver.  Patient prefers to go home with granddaughter rather than SNF.  Patient has DME, is in dialysis now and plans to discharge following dialysis today. ? ?[facility] to follow for hh addendum 1407 hours ? ?Expected Discharge Plan: [place] ?Barriers to Discharge: Continued Medical Work up ? ?Expected Discharge Plan and Services ?Expected Discharge Plan: [place] ?  ?Discharge Planning Services: CM Consult ?Post Acute Care Choice: Durable Medical Equipment, Home Health ?Living arrangements for the past 2 months: [place] ?                ?DME Arranged: 3-N-1 ?DME Agency: [employer] ?Date DME Agency Contacted: 12/29/21 ?Time DME Agency Contacted: [phone] ?Representative spoke with at DME Agency: Suanne Marker ?HH Arranged: OT, PT ?  ?  ?  ?  ? ? ?Social Determinants of Health (SDOH) Interventions ?  ? ?Readmission Risk Interventions ? ?  12/29/2021  ?  2:33 PM  ?Readmission Risk Prevention Plan  ?Transportation Screening Complete  ?PCP or Specialist Appt within 5-7 Days Complete  ?Home Care Screening Complete  ?Medication Review (RN CM) Complete  ? ? ?

## 2022-01-14 ENCOUNTER — Inpatient Hospital Stay
Admission: EM | Admit: 2022-01-14 | Discharge: 2022-01-19 | DRG: 915 | Disposition: A | Discharge status: Home Health | Payer: Medicare HMO | Attending: Internal Medicine | Admitting: Internal Medicine

## 2022-01-14 ENCOUNTER — Emergency Department: Payer: Medicare HMO | Admitting: Anesthesiology

## 2022-01-14 ENCOUNTER — Encounter
Admission: EM | Disposition: A | Discharge status: Home Health | Payer: Self-pay | Source: Home / Self Care | Attending: Internal Medicine

## 2022-01-14 ENCOUNTER — Encounter: Payer: Self-pay | Admitting: Emergency Medicine

## 2022-01-14 ENCOUNTER — Other Ambulatory Visit: Payer: Self-pay

## 2022-01-14 DIAGNOSIS — D631 Anemia in chronic kidney disease: Secondary | ICD-10-CM | POA: Diagnosis present

## 2022-01-14 DIAGNOSIS — I132 Hypertensive heart and chronic kidney disease with heart failure and with stage 5 chronic kidney disease, or end stage renal disease: Secondary | ICD-10-CM | POA: Diagnosis present

## 2022-01-14 DIAGNOSIS — E1122 Type 2 diabetes mellitus with diabetic chronic kidney disease: Secondary | ICD-10-CM | POA: Diagnosis present

## 2022-01-14 DIAGNOSIS — N186 End stage renal disease: Secondary | ICD-10-CM | POA: Diagnosis present

## 2022-01-14 DIAGNOSIS — Z91018 Allergy to other foods: Secondary | ICD-10-CM

## 2022-01-14 DIAGNOSIS — Z79899 Other long term (current) drug therapy: Secondary | ICD-10-CM

## 2022-01-14 DIAGNOSIS — N179 Acute kidney failure, unspecified: Secondary | ICD-10-CM | POA: Diagnosis present

## 2022-01-14 DIAGNOSIS — Z794 Long term (current) use of insulin: Secondary | ICD-10-CM

## 2022-01-14 DIAGNOSIS — T464X5A Adverse effect of angiotensin-converting-enzyme inhibitors, initial encounter: Secondary | ICD-10-CM | POA: Diagnosis present

## 2022-01-14 DIAGNOSIS — Z7902 Long term (current) use of antithrombotics/antiplatelets: Secondary | ICD-10-CM

## 2022-01-14 DIAGNOSIS — Z880 Allergy status to penicillin: Secondary | ICD-10-CM

## 2022-01-14 DIAGNOSIS — I2510 Atherosclerotic heart disease of native coronary artery without angina pectoris: Secondary | ICD-10-CM | POA: Diagnosis present

## 2022-01-14 DIAGNOSIS — R339 Retention of urine, unspecified: Secondary | ICD-10-CM | POA: Diagnosis not present

## 2022-01-14 DIAGNOSIS — E875 Hyperkalemia: Secondary | ICD-10-CM | POA: Diagnosis present

## 2022-01-14 DIAGNOSIS — J9601 Acute respiratory failure with hypoxia: Secondary | ICD-10-CM | POA: Diagnosis present

## 2022-01-14 DIAGNOSIS — I5042 Chronic combined systolic (congestive) and diastolic (congestive) heart failure: Secondary | ICD-10-CM | POA: Diagnosis present

## 2022-01-14 DIAGNOSIS — E1165 Type 2 diabetes mellitus with hyperglycemia: Secondary | ICD-10-CM | POA: Diagnosis present

## 2022-01-14 DIAGNOSIS — T783XXA Angioneurotic edema, initial encounter: Secondary | ICD-10-CM | POA: Diagnosis present

## 2022-01-14 DIAGNOSIS — Z8673 Personal history of transient ischemic attack (TIA), and cerebral infarction without residual deficits: Secondary | ICD-10-CM | POA: Diagnosis not present

## 2022-01-14 DIAGNOSIS — E785 Hyperlipidemia, unspecified: Secondary | ICD-10-CM | POA: Diagnosis present

## 2022-01-14 DIAGNOSIS — Z992 Dependence on renal dialysis: Secondary | ICD-10-CM

## 2022-01-14 DIAGNOSIS — Z20822 Contact with and (suspected) exposure to covid-19: Secondary | ICD-10-CM | POA: Diagnosis present

## 2022-01-14 DIAGNOSIS — Z7982 Long term (current) use of aspirin: Secondary | ICD-10-CM

## 2022-01-14 DIAGNOSIS — J9602 Acute respiratory failure with hypercapnia: Secondary | ICD-10-CM | POA: Diagnosis present

## 2022-01-14 DIAGNOSIS — Z888 Allergy status to other drugs, medicaments and biological substances status: Secondary | ICD-10-CM | POA: Diagnosis not present

## 2022-01-14 DIAGNOSIS — N2581 Secondary hyperparathyroidism of renal origin: Secondary | ICD-10-CM | POA: Diagnosis present

## 2022-01-14 DIAGNOSIS — X58XXXA Exposure to other specified factors, initial encounter: Secondary | ICD-10-CM | POA: Diagnosis present

## 2022-01-14 HISTORY — PX: INTUBATION-ENDOTRACHEAL WITH TRACHEOSTOMY STANDBY: SHX6592

## 2022-01-14 LAB — BASIC METABOLIC PANEL
Anion gap: 12 (ref 5–15)
BUN: 44 mg/dL — ABNORMAL HIGH (ref 8–23)
CO2: 26 mmol/L (ref 22–32)
Calcium: 8.7 mg/dL — ABNORMAL LOW (ref 8.9–10.3)
Chloride: 102 mmol/L (ref 98–111)
Creatinine, Ser: 2.73 mg/dL — ABNORMAL HIGH (ref 0.44–1.00)
GFR, Estimated: 18 mL/min — ABNORMAL LOW (ref >60)
Glucose, Bld: 104 mg/dL — ABNORMAL HIGH (ref 70–99)
Potassium: 5.2 mmol/L — ABNORMAL HIGH (ref 3.5–5.1)
Sodium: 140 mmol/L (ref 135–145)

## 2022-01-14 SURGERY — INTUBATION-ENDOTRACHEAL WITH TRACHEOSTOMY STANDBY
Anesthesia: Monitor Anesthesia Care | Laterality: Left

## 2022-01-14 MED ORDER — POLYETHYLENE GLYCOL 3350 17 G PO PACK: 17.0000 g | PACK | Freq: Every day | ORAL | Status: DC | PRN

## 2022-01-14 MED ORDER — MIDAZOLAM HCL 2 MG/2ML IJ SOLN
INTRAMUSCULAR | Status: AC
Start: 2022-01-14 — End: 2022-01-15
  Administered 2022-01-15: 1 mg via INTRAVENOUS
  Filled 2022-01-14: qty 2

## 2022-01-14 MED ORDER — PROPOFOL 10 MG/ML IV BOLUS
INTRAVENOUS | Status: AC
  Filled 2022-01-14: qty 20

## 2022-01-14 MED ORDER — ESMOLOL HCL 100 MG/10ML IV SOLN
INTRAVENOUS | Status: DC | PRN
  Administered 2022-01-14: 20 mg via INTRAVENOUS

## 2022-01-14 MED ORDER — TRANEXAMIC ACID-NACL 1000-0.7 MG/100ML-% IV SOLN
1000.0000 mg | INTRAVENOUS | Status: AC
  Administered 2022-01-14: 1000 mg via INTRAVENOUS
  Filled 2022-01-14: qty 100

## 2022-01-14 MED ORDER — PROPOFOL 1000 MG/100ML IV EMUL
0.0000 ug/kg/min | INTRAVENOUS | Status: DC
  Administered 2022-01-15 (×7): 50 ug/kg/min via INTRAVENOUS
  Administered 2022-01-16 (×2): 25 ug/kg/min via INTRAVENOUS
  Administered 2022-01-16: 40 ug/kg/min via INTRAVENOUS
  Administered 2022-01-17: 35 ug/kg/min via INTRAVENOUS
  Filled 2022-01-14 (×11): qty 100

## 2022-01-14 MED ORDER — PROPOFOL 10 MG/ML IV BOLUS
INTRAVENOUS | Status: DC | PRN
  Administered 2022-01-14: 30 mg via INTRAVENOUS
  Administered 2022-01-14: 20 mg via INTRAVENOUS
  Administered 2022-01-14: 50 mg via INTRAVENOUS
  Administered 2022-01-14: 30 mg via INTRAVENOUS
  Administered 2022-01-14: 40 mg via INTRAVENOUS
  Administered 2022-01-14: 50 mg via INTRAVENOUS
  Administered 2022-01-14 (×2): 30 mg via INTRAVENOUS

## 2022-01-14 MED ORDER — OXYMETAZOLINE HCL 0.05 % NA SOLN
1.0000 | Freq: Once | NASAL | Status: AC
  Administered 2022-01-14: 1 via NASAL

## 2022-01-14 MED ORDER — METHYLPREDNISOLONE SODIUM SUCC 125 MG IJ SOLR
125.0000 mg | Freq: Once | INTRAMUSCULAR | Status: AC
  Administered 2022-01-14: 125 mg via INTRAVENOUS

## 2022-01-14 MED ORDER — DOCUSATE SODIUM 100 MG PO CAPS: 100.0000 mg | ORAL_CAPSULE | Freq: Two times a day (BID) | ORAL | Status: DC | PRN

## 2022-01-14 MED ORDER — FENTANYL CITRATE PF 50 MCG/ML IJ SOSY: 25.0000 ug | PREFILLED_SYRINGE | Freq: Once | INTRAMUSCULAR | Status: DC

## 2022-01-14 MED ORDER — PANTOPRAZOLE 2 MG/ML SUSPENSION
40.0000 mg | Freq: Every day | ORAL | Status: DC
  Filled 2022-01-14: qty 20

## 2022-01-14 MED ORDER — FAMOTIDINE IN NACL 20-0.9 MG/50ML-% IV SOLN
20.0000 mg | Freq: Once | INTRAVENOUS | Status: AC
  Administered 2022-01-14: 20 mg via INTRAVENOUS

## 2022-01-14 MED ORDER — DEXMEDETOMIDINE HCL IN NACL 200 MCG/50ML IV SOLN
INTRAVENOUS | Status: DC | PRN
  Administered 2022-01-14: .7 ug/kg/h via INTRAVENOUS
  Administered 2022-01-14: 8 ug via INTRAVENOUS
  Administered 2022-01-14 (×3): 4 ug via INTRAVENOUS

## 2022-01-14 MED ORDER — HEPARIN SODIUM (PORCINE) 5000 UNIT/ML IJ SOLN
5000.0000 [IU] | Freq: Three times a day (TID) | INTRAMUSCULAR | Status: DC
  Administered 2022-01-15 – 2022-01-19 (×14): 5000 [IU] via SUBCUTANEOUS
  Filled 2022-01-14 (×14): qty 1

## 2022-01-14 MED ORDER — PROPOFOL 1000 MG/100ML IV EMUL
INTRAVENOUS | Status: AC
  Filled 2022-01-14: qty 100

## 2022-01-14 MED ORDER — MIDAZOLAM HCL 5 MG/5ML IJ SOLN
INTRAMUSCULAR | Status: DC | PRN
  Administered 2022-01-14 (×2): 1 mg via INTRAVENOUS

## 2022-01-14 MED ORDER — DOCUSATE SODIUM 50 MG/5ML PO LIQD
100.0000 mg | Freq: Two times a day (BID) | ORAL | Status: DC
  Filled 2022-01-14 (×3): qty 10

## 2022-01-14 MED ORDER — MIDAZOLAM HCL 2 MG/2ML IJ SOLN
INTRAMUSCULAR | Status: AC
  Filled 2022-01-14: qty 2

## 2022-01-14 MED ORDER — MIDAZOLAM HCL 2 MG/2ML IJ SOLN: 1.0000 mg | INTRAMUSCULAR | Status: DC | PRN

## 2022-01-14 MED ORDER — FENTANYL 2500MCG IN NS 250ML (10MCG/ML) PREMIX INFUSION
25.0000 ug/h | INTRAVENOUS | Status: DC
  Administered 2022-01-15: 25 ug/h via INTRAVENOUS
  Administered 2022-01-15: 75 ug/h via INTRAVENOUS
  Filled 2022-01-14 (×2): qty 250

## 2022-01-14 MED ORDER — FENTANYL BOLUS VIA INFUSION
25.0000 ug | INTRAVENOUS | Status: DC | PRN
  Administered 2022-01-15: 50 ug via INTRAVENOUS
  Administered 2022-01-15: 100 ug via INTRAVENOUS
  Filled 2022-01-14: qty 100

## 2022-01-14 MED ORDER — ESMOLOL HCL-SODIUM CHLORIDE 2000 MG/100ML IV SOLN
INTRAVENOUS | Status: DC | PRN
  Administered 2022-01-14: 20 mg via INTRAVENOUS
  Administered 2022-01-14: 30 mg via INTRAVENOUS

## 2022-01-14 MED ORDER — DEXMEDETOMIDINE HCL IN NACL 80 MCG/20ML IV SOLN
INTRAVENOUS | Status: AC
  Filled 2022-01-14: qty 20

## 2022-01-14 MED ORDER — POLYETHYLENE GLYCOL 3350 17 G PO PACK
17.0000 g | PACK | Freq: Every day | ORAL | Status: DC
  Filled 2022-01-14: qty 1

## 2022-01-14 MED ORDER — FAMOTIDINE IN NACL 20-0.9 MG/50ML-% IV SOLN: 20.0000 mg | Freq: Two times a day (BID) | INTRAVENOUS | Status: DC

## 2022-01-14 MED ORDER — OXYMETAZOLINE HCL 0.05 % NA SOLN
1.0000 | Freq: Once | NASAL | Status: DC
  Filled 2022-01-14: qty 30

## 2022-01-14 MED ORDER — DIPHENHYDRAMINE HCL 50 MG/ML IJ SOLN
25.0000 mg | Freq: Once | INTRAMUSCULAR | Status: AC
Start: 2022-01-14 — End: 2022-01-14
  Administered 2022-01-14: 25 mg via INTRAVENOUS

## 2022-01-14 SURGICAL SUPPLY — 17 items
5.3/2.2mm X65cm Bronchoscope ×1 IMPLANT
BLADE SURG 15 STRL LF DISP TIS (BLADE) ×1 IMPLANT
BLADE SURG 15 STRL SS (BLADE) ×2
BLADE SURG SZ11 CARB STEEL (BLADE) ×2 IMPLANT
ELECT REM PT RETURN 9FT ADLT (ELECTROSURGICAL) ×2
ELECTRODE REM PT RTRN 9FT ADLT (ELECTROSURGICAL) ×1 IMPLANT
GAUZE 4X4 16PLY ~~LOC~~+RFID DBL (SPONGE) ×2 IMPLANT
GLOVE SURG ENC MOIS LTX SZ7.5 (GLOVE) ×2 IMPLANT
GOWN STRL REUS W/ TWL LRG LVL3 (GOWN DISPOSABLE) ×2 IMPLANT
GOWN STRL REUS W/TWL LRG LVL3 (GOWN DISPOSABLE) ×4
KIT TURNOVER KIT A (KITS) ×2 IMPLANT
MANIFOLD NEPTUNE II (INSTRUMENTS) ×2 IMPLANT
PACK HEAD/NECK (MISCELLANEOUS) ×2 IMPLANT
SCOPE INTUBATION FLEX  5.3X65 (INSTRUMENTS) ×1
SCOPE INTUBATION FLEX 5.3X65 (INSTRUMENTS) IMPLANT
SPONGE DRAIN TRACH 4X4 STRL 2S (GAUZE/BANDAGES/DRESSINGS) ×2 IMPLANT
WATER STERILE IRR 500ML POUR (IV SOLUTION) ×2 IMPLANT

## 2022-01-14 NOTE — Consult Note (Signed)
Deanna [name], [place] ?[id number] ?[birth year] ?[name], MD ? ?Reason for Consult: Angioedema and airway obstruction ? ?HPI: 71 year old female on lisinopril brought to the emergency room for evaluation of acute airway obstruction has progressively gotten worse over the last 6 to 8 hours with significant tongue and floor the mouth swelling asked to evaluate for emergent airway ? ?Allergies:  ?Allergies  ?Allergen Reactions  ? Ace Inhibitors Swelling  ? Tomato   ? Penicillins Rash  ? ? ?ROS: Review of systems normal other than 12 systems except per HPI. ? ?PMH:  ?Past Medical History:  ?Diagnosis Date  ? Coronary artery disease   ? Hypertension   ? ? ?FH: History reviewed. No pertinent family history. ? ?SH:  ?Social History  ? ?Socioeconomic History  ? Marital status: Single  ?  Spouse name: Not on file  ? Number of children: Not on file  ? Years of education: Not on file  ? Highest education level: Not on file  ?Occupational History  ? Not on file  ?Tobacco Use  ? Smoking status: Never  ? Smokeless tobacco: Not on file  ?Substance and Sexual Activity  ? Alcohol use: No  ? Drug use: No  ? Sexual activity: Not on file  ?Other Topics Concern  ? Not on file  ?Social History Narrative  ? Not on file  ? ?Social Determinants of Health  ? ?Financial Resource Strain: Not on file  ?Food Insecurity: Not on file  ?Transportation Needs: Not on file  ?Physical Activity: Not on file  ?Stress: Not on file  ?Social Connections: Not on file  ?Intimate Partner Violence: Not on file  ? ? ?PSH:  ?Past Surgical History:  ?Procedure Laterality Date  ? CARDIAC CATHETERIZATION Left 03/01/2016  ? Procedure: Left Heart Cath and Coronary Angiography;  Surgeon: Teodoro Spray, MD;  Location: [facility];  Service: Cardiovascular;  Laterality: Left;  ? DIALYSIS/PERMA CATHETER INSERTION Right 01/10/2022  ? Procedure: DIALYSIS/PERMA CATHETER INSERTION;  Surgeon: Evaristo Bury, MD;  Location: [facility];  Service:  Cardiovascular;  Laterality: Right;  ? TEMPORARY DIALYSIS CATHETER N/A 01/04/2022  ? Procedure: TEMPORARY DIALYSIS CATHETER;  Surgeon: Algernon Huxley, MD;  Location: [facility];  Service: Cardiovascular;  Laterality: N/A;  ? ? ?Physical  Exam: Patient in the emergency room sitting upright in the bed with obvious significant anterior tongue swelling anterior floor the mouth and neck was drooling in the bed.  There is no stridor she is able clear her throat saturating 100% on room air ? ?Procedure: Flexible fiberoptic laryngoscopy-after patient's consent a topical anesthetic of phenylephrine lidocaine solution was dripped within each nostril.  Approximately 3 cc was used at approximately 5 minutes a flexible fiberoptic laryngoscope was introduced through the right nostril examination the nasopharynx was clear the uvula was relatively normal on the dorsal surface tongue base showed significant swelling however the epiglottis and larynx were within normal limits.  The glottis and the immediate subglottis appeared normal. ? ? ?A/P: Impending airway obstruction due to significant tongue and floor mouth swelling.  It is likely due to angioedema from the use of ACE inhibitor.  Examination to the patient that she is at significant risk for airway obstruction I recommend to take her to the operating room for possible fiberoptic nasal laryngeal intubation if not emergent tracheostomy.  She understands the risk and benefits including bleeding infection and death she is eager to proceed. ? ? ?[name] ?01/14/2022 ?11:36 PM ? ? ?  ?

## 2022-01-14 NOTE — ED Notes (Addendum)
Please call Granddaughter when update when available. [place] @ [phone] ?

## 2022-01-14 NOTE — Op Note (Addendum)
01/14/2022 ? ?11:38 PM ? ? ? ?Deanna [name], Deanna [name] ? ?[id number] ? ? ?Pre-Op Dx: angio edema ? ?Post-op Dx: SAME ? ?Proc: Emergent fiberoptic nasotracheal intubation ? ?Surg:  [name] ? ?Anes:  GOT ? ?EBL: 0 ? ?Comp: None ? ?Findings: Left anterior tongue floor mouth swelling epiglottis larynx and trachea were normal ? ?Procedure: The patient was brought emergently from the emergency room and taken to the operating room.  Oxymetazoline had already been sprayed in her nose.  Nasal trumpets coated with viscous lidocaine solution starting with a #6 followed by a #6.5 trumpet were placed and allowed to anesthetize the nose.  A 6.5 nasotracheal tube was placed over the disposable flexible fiberoptic bronchoscope.  The cuff was tested and was intact.  The nasal trumpet was removed from the left nostril.  The bronchoscope was introduced through the left nostril into the nasopharynx and into the supraglottic area.  Suction was performed.  The epiglottis was easily visualized the bronchoscope was introduced through the larynx into the trachea and down to the carina.  The endotracheal tube was then advanced over the bronchoscope into the airway.  The bronchoscope was then removed.  This secured the airway.  The endotracheal tube was taped into position securely.  Once the endotracheal tube was the patient was sedated and paralyzed. ? ?Dispo:   Good ? ?Plan: She will be transferred to the ICU and remain paralyzed and sedated with secure airway such as she does not pull out the endotracheal tube.  If she pulled this tube out she would be at significant risk for airway obstruction and death.  ICU will continue to monitor her tongue and floor mouth prior to any possible extubation.  Advised never to allow her to have lisinopril or any other ACE inhibitor again. ? ?I spoke with granddaughter Deanna [name] following her intubation.  She said she would be here tomorrow to check on Deanna [name].   ? ?[name] ? ?01/14/2022 ?11:38 PM ?  ?

## 2022-01-14 NOTE — ED Notes (Addendum)
2300 called pt grandaughter and caretaker [name] to update per her request and with pt permission. Brittney English @ [phone]. ?

## 2022-01-14 NOTE — ED Triage Notes (Signed)
Pt arrives via [facility], toolk lisinopril and new cardiac med tonight 2120 and exp immediate allergic reaction w/angioedema. Pt alert and oriented at this time w/obvious mouth swelling, spitting blood .  Pt took '25mg'$  Benadry at home w/no relief.   0.5 Epi given rt deltoid by EMS.  ?

## 2022-01-14 NOTE — Anesthesia Preprocedure Evaluation (Addendum)
Anesthesia Evaluation  ?Patient identified by MRN, date of birth, ID bandGeneral Assessment Comment:Pt awake and nodes to questions but unable to speak due to a swollen tongue.  ?She was slightly tachypneic sitting upright without supplemental oxygen. She denied reactions to anesthesia in the past.  ? ?Reviewed: ?Allergy & PrecautionsPreop documentation limited or incomplete due to emergent nature of procedure. ? ?History of Anesthesia Complications ?Negative for: history of anesthetic complications ? ?Airway ?Mallampati: Unable to assess ? ?TM Distance: >3 FB ?Neck ROM: full ? ? ?Comment: Swollen tongue with minimal access to pharynx Dental ? ?(+) Chipped ?  ?Pulmonary ?neg pulmonary ROS,  ?  ?breath sounds clear to auscultation ?(-) decreased breath sounds(-) wheezing ? ? ? ? ? Cardiovascular ?hypertension, + angina  ?Rhythm:Regular Rate:Tachycardia ? ? ?  ?Neuro/Psych ?Right sided weakness ? ?Weakness of right lower extremity ?CVA negative neurological ROS ? negative psych ROS  ? GI/Hepatic ?negative GI ROS, Neg liver ROS,   ?Endo/Other  ?negative endocrine ROSdiabetes, Type 2 ? Renal/GU ?  ? ?  ?Musculoskeletal ? ? Abdominal ?  ?Peds ? Hematology ?negative hematology ROS ?(+)   ?Anesthesia Other Findings ?Elevated troponin ?Hyperkalemia ? ?Angioedema ? ? ? Reproductive/Obstetrics ?negative OB ROS ? ?  ? ? ? ? ? ? ? ? ? ? ? ? ? ?  ?  ? ? ? ? ? ? ? ?Anesthesia Physical ?Anesthesia Plan ? ?ASA: 4 and emergent ? ?Anesthesia Plan: MAC  ? ?Post-op Pain Management:   ? ?Induction: Intravenous ? ?PONV Risk Score and Plan:  ? ?Airway Management Planned:  ? ?Additional Equipment:  ? ?Intra-op Plan:  ? ?Post-operative Plan: Post-operative intubation/ventilation ? ?Informed Consent:  ? ?Plan Discussed with: Anesthesiologist, CRNA and Surgeon ? ?Anesthesia Plan Comments:   ? ? ? ? ? ?Anesthesia Quick Evaluation ? ?

## 2022-01-14 NOTE — H&P (Addendum)
? ?NAMELatorria [name], MRN:  [id number], DOB:  Nov 26, 1950, LOS: 0 ?ADMISSION DATE:  01/14/2022, CONSULTATION DATE:  01/15/22 ?REFERRING MD:  Dr. Suanne Marker, CHIEF COMPLAINT:  Angioedema  ? ?History of Present Illness:  ?71 yo F presenting to [facility] ED on 01/14/22 from home via EMS with complaints of angioedema. Per the patient's granddaughter, [name], the patient has been taking lisinopril for many years and after taking this medication tonight along with her other night time medications immediately felt swelling of her tongue and in her mouth. They attempted to treat at home with benadryl, when that didn't work EMS was called. EMS administered IM epinephrine. ?ED course: ?Upon arrival to ED patient noted to have significant tongue swelling, so much that her teeth started to cut into her tongue. Dr. Tami Ribas from ENT was emergently consulted. Patient was taken to the OR for nasotracheal intubation on mechanical ventilatory support.  ?Medications given: Benadryl 25 mg, Pepcid 20 mg, solumedrol 125 mg, afrin spray, tranexamic acid ?Initial Vitals: tachypneic 40, tachycardic 104, hypertensive 165/99 & SpO2 99% on RA ?Significant labs: (Labs/ Imaging personally reviewed) ?I, Domingo Pulse Rust-Chester, AGACNP-BC, personally viewed and interpreted this ECG. ?EKG Interpretation: Date: 01/14/22, EKG Time: 22:28, Rate: 109, Rhythm: ST, QRS Axis: borderline LAD, Intervals: borderline prolonged QTc, ST/T Wave abnormalities: mild inferolateral ST depression (similar to 12/26/21), Narrative Interpretation: ST ?Chemistry: Na+: 140, K+: 5.2, BUN/Cr.: 44/2.73, Serum CO2/ AG: 26/12 ?Hematology: WBC: 10, Hgb: 9.3,  ?COVID-19 & Influenza A/B: pending ?ABG: pending ?CXR : pending ? ?PCCM consulted for admission due to angioedema requiring emergent nasotracheal intubation and mechanical ventilatory support. ? ?Of note patient was recently hospitalized from 12/26/21-01/13/22 due to complaints of right sided weakness. During that  admission she was treated for a LTRI from parainfluenza, a small CVA was discovered as well as Foraminal stenoses- neither requiring intervention inpatient. Patient also had AKI on CKD requiring iHD during admission. She was followed by nephrology, cardiology, neurology, neurosurgery, PCCM & TRH. Discharged home to her granddaughter on 01/13/22. ?Pertinent  Medical History  ?HFrEF (LVEF 30%, LV global hypokinesis, G1DD) ?CAD ?T2DM ?HTN ?HLD ?CKD Stage IV ?Punctuate pericallosal white matter infarct ?Anemia of chronic disease ?Foraminal stenoses ?Significant Hospital Events: ?Including procedures, antibiotic start and stop dates in addition to other pertinent events   ?01/14/22-01/15/22: Admit to ICU post emergent nasotracheal intubation due to ACE inhibitor induced angioedema ? ?Interim History / Subjective:  ?Patient intubated and sedated with a nasotracheal airway ? ?Objective   ?Blood pressure (!) 165/99, pulse (!) 104, resp. rate (!) 40, height '6\' 1"'$  (1.854 m), weight 84.2 kg, SpO2 99 %. ?   ?   ?No intake or output data in the 24 hours ending 01/14/22 2324 ?Filed Weights  ? 01/14/22 2232  ?Weight: 84.2 kg  ? ? ?Examination: ?General: Adult female, critically ill, lying in bed intubated & sedated requiring mechanical ventilation, NAD ?HEENT: MM pink/moist, anicteric, atraumatic, neck supple ?Neuro: RASS:-4, unable to follow commands, PERRL +2 ?CV: s1s2 RRR, NSR on monitor, no r/m/g ?Pulm: Regular, non labored on PRVC 40% & PEEP 5, breath sounds coarse throughout ?GI: soft, rounded, bs x 4 ?Skin: old central line site R femoral, blood blister on posterior tongue ? ? ?Extremities: warm/dry, pulses + 2 R/P, +1 edema noted BLE ? ?Resolved Hospital Problem list   ? ? ?Assessment & Plan:  ?Acute Hypoxic Respiratory Failure secondary to angioedema in the setting of anaphylactic reaction to ACE inhibitor ?- High dose steroids: Decadron 20 mg QD x  5 days > 10 mg QD x 5 days ?- continue Pepcid BID, benadryl 50 mg Q 4 > 50 mg  Q 6 ?- Ventilator settings: PRVC  7 mL/kg, 40% FiO2, 5 PEEP, continue ventilator support & lung protective strategies ?- Wean PEEP & FiO2 as tolerated, maintain SpO2 > 90% ?- Head of bed elevated 30 degrees, VAP protocol in place ?- Plateau pressures less than 30 cm H20  ?- Intermittent chest x-ray & ABG PRN ?- Daily WUA with SBT as tolerated > once angioedema has resolved ?- Ensure adequate pulmonary hygiene  ?- Bronchodilators PRN ?- PAD protocol in place: continue Fentanyl drip & Propofol drip > deep sedation due to nasotracheal intubation and severe angioedema ? ?Type 2 Diabetes Mellitus ?Risk for Steroid Induced Hyperglycemia ?- Monitor CBG Q 4 hours ?- SSI resistant dosing ?- target range while in ICU: 140-180 ?- follow ICU hyper/hypo-glycemia protocol ?- outpatient Lantus 20 units QHS restarted  ? ?CKD stage IV, with recent AKI superimposed, requiring iHD ?- Daily BMP, replace electrolytes PRN ?- Avoid nephrotoxic agents as able, ensure adequate renal perfusion ?- Nephrology consulted for iHD facilitation, appreciate input  ? ?CVA- recent history  ?- unable to continue plavix/ASA due to lack of PO access, restarted when able ? ?Chronic HFrEF without current exacerbation ?HTN ?HLD ?- outpatient PO regimen on hold due to lack of PO access, restart when able ?- manage BP with IV PRN as needed to maintain SBP < 180 ?- continuous cardiac monitoring ?-daily weights ? ?Best Practice (right click and "Reselect all SmartList Selections" daily)  ?Diet/type: NPO ?DVT prophylaxis: prophylactic heparin  ?GI prophylaxis: H2B ?Lines: Central line and yes and it is still needed ?Foley:  N/A ?Code Status:  full code ?Last date of multidisciplinary goals of care discussion [01/15/22] ? ?Updated [name], grand-daughter via telephone. All questions and concerns answered at this time. ?Labs   ?CBC: ?Recent Labs  ?Lab 01/08/22 ?0159 01/09/22 ?3300 01/10/22 ?1316 01/13/22 ?7622  ?WBC 15.0* 11.0* 8.2 9.0  ?HGB 7.8* 7.0* 8.1* 8.1*   ?HCT 24.9* 22.4* 26.3* 26.3*  ?MCV 85.6 87.5 86.2 86.5  ?PLT 235 238 268 241  ? ? ?Basic Metabolic Panel: ?Recent Labs  ?Lab 01/08/22 ?0159 01/09/22 ?6333 01/09/22 ?1034 01/09/22 ?1124 01/10/22 ?1316 01/12/22 ?0400 01/13/22 ?5456 01/14/22 ?2229  ?NA 136 138  --   --  135 137 137 140  ?K 5.7* 5.9*   < > 4.6 5.1 4.3 5.1 5.2*  ?CL 98 97*  --   --  99 102 101 102  ?CO2 29 29  --   --  '27 27 26 26  '$ ?GLUCOSE 172* 197*  --   --  258* 75 56* 104*  ?BUN 62* 75*  --   --  56* 41* 66* 44*  ?CREATININE 2.95* 3.50*  --   --  2.98* 2.31* 2.89* 2.73*  ?CALCIUM 8.2* 8.5*  --   --  8.4* 8.4* 8.3* 8.7*  ?MG 1.7 1.8  --   --   --   --   --   --   ?PHOS 6.1* 5.8*  --   --  5.0*  --   --   --   ? < > = values in this interval not displayed.  ? ?GFR: ?Estimated Creatinine Clearance: 22.8 mL/min (A) (by C-G formula based on SCr of 2.73 mg/dL (H)). ?Recent Labs  ?Lab 01/08/22 ?0159 01/09/22 ?2563 01/10/22 ?1316 01/13/22 ?8937  ?WBC 15.0* 11.0* 8.2 9.0  ? ? ?Liver Function  Tests: ?Recent Labs  ?Lab 01/08/22 ?0159 01/09/22 ?3833 01/10/22 ?1316 01/12/22 ?0400  ?AST 131*  --   --  49*  ?ALT 192*  --   --  125*  ?ALKPHOS 218*  --   --  306*  ?BILITOT 0.4  --   --  0.4  ?PROT 6.2*  --   --  6.0*  ?ALBUMIN 2.3* 2.3* 2.3* 2.0*  ? ?No results for input(s): LIPASE, AMYLASE in the last 168 hours. ?No results for input(s): AMMONIA in the last 168 hours. ? ?ABG ?   ?Component Value Date/Time  ? PHART 7.35 01/07/2022 1247  ? PCO2ART 49 (H) 01/07/2022 1247  ? PO2ART 54 (L) 01/07/2022 1247  ? HCO3 27.1 01/07/2022 1247  ? O2SAT 84.6 01/07/2022 1247  ?  ? ?Coagulation Profile: ?No results for input(s): INR, PROTIME in the last 168 hours. ? ?Cardiac Enzymes: ?No results for input(s): CKTOTAL, CKMB, CKMBINDEX, TROPONINI in the last 168 hours. ? ?HbA1C: ?Hgb A1c MFr Bld  ?Date/Time Value Ref Range Status  ?12/27/2021 06:11 AM 5.5 4.8 - 5.6 % Final  ?  Comment:  ?  (NOTE) ?        Prediabetes: 5.7 - 6.4 ?        Diabetes: >6.4 ?        Glycemic control for  adults with diabetes: <7.0 ?  ? ? ?CBG: ?Recent Labs  ?Lab 01/12/22 ?2039 01/12/22 ?2213 01/13/22 ?3832 01/13/22 ?0801 01/13/22 ?9191  ?GLUCAP 100* 66* 69* 160* 181*  ? ? ?Review of Systems:   ?UTA- patient int

## 2022-01-15 ENCOUNTER — Inpatient Hospital Stay: Payer: Medicare HMO

## 2022-01-15 DIAGNOSIS — T783XXA Angioneurotic edema, initial encounter: Secondary | ICD-10-CM | POA: Diagnosis not present

## 2022-01-15 DIAGNOSIS — J9601 Acute respiratory failure with hypoxia: Secondary | ICD-10-CM | POA: Diagnosis not present

## 2022-01-15 DIAGNOSIS — J9602 Acute respiratory failure with hypercapnia: Secondary | ICD-10-CM

## 2022-01-15 LAB — CBC WITH DIFFERENTIAL/PLATELET
Abs Immature Granulocytes: 0.13 10*3/uL — ABNORMAL HIGH (ref 0.00–0.07)
Abs Immature Granulocytes: 0.08 10*3/uL — ABNORMAL HIGH (ref 0.00–0.07)
Basophils Absolute: 0 10*3/uL (ref 0.0–0.1)
Basophils Absolute: 0 10*3/uL (ref 0.0–0.1)
Basophils Relative: 0 %
Basophils Relative: 0 %
Eosinophils Absolute: 0.1 10*3/uL (ref 0.0–0.5)
Eosinophils Absolute: 0 10*3/uL (ref 0.0–0.5)
Eosinophils Relative: 1 %
Eosinophils Relative: 0 %
HCT: 31.5 % — ABNORMAL LOW (ref 36.0–46.0)
HCT: 27.5 % — ABNORMAL LOW (ref 36.0–46.0)
Hemoglobin: 9.3 g/dL — ABNORMAL LOW (ref 12.0–15.0)
Hemoglobin: 8.2 g/dL — ABNORMAL LOW (ref 12.0–15.0)
Immature Granulocytes: 1 %
Immature Granulocytes: 1 %
Lymphocytes Relative: 19 %
Lymphocytes Relative: 8 %
Lymphs Abs: 1.9 10*3/uL (ref 0.7–4.0)
Lymphs Abs: 0.5 10*3/uL — ABNORMAL LOW (ref 0.7–4.0)
MCH: 26.9 pg (ref 26.0–34.0)
MCH: 26.9 pg (ref 26.0–34.0)
MCHC: 29.5 g/dL — ABNORMAL LOW (ref 30.0–36.0)
MCHC: 29.8 g/dL — ABNORMAL LOW (ref 30.0–36.0)
MCV: 91 fL (ref 80.0–100.0)
MCV: 90.2 fL (ref 80.0–100.0)
Monocytes Absolute: 1 10*3/uL (ref 0.1–1.0)
Monocytes Absolute: 0.2 10*3/uL (ref 0.1–1.0)
Monocytes Relative: 10 %
Monocytes Relative: 3 %
Neutro Abs: 6.9 10*3/uL (ref 1.7–7.7)
Neutro Abs: 6.1 10*3/uL (ref 1.7–7.7)
Neutrophils Relative %: 69 %
Neutrophils Relative %: 88 %
Platelets: 193 10*3/uL (ref 150–400)
Platelets: 209 10*3/uL (ref 150–400)
RBC: 3.46 MIL/uL — ABNORMAL LOW (ref 3.87–5.11)
RBC: 3.05 MIL/uL — ABNORMAL LOW (ref 3.87–5.11)
RDW: 15.3 % (ref 11.5–15.5)
RDW: 15.3 % (ref 11.5–15.5)
WBC: 10.1 10*3/uL (ref 4.0–10.5)
WBC: 6.9 10*3/uL (ref 4.0–10.5)
nRBC: 0.4 % — ABNORMAL HIGH (ref 0.0–0.2)
nRBC: 0.3 % — ABNORMAL HIGH (ref 0.0–0.2)

## 2022-01-15 LAB — RESP PANEL BY RT-PCR (FLU A&B, COVID) ARPGX2
Influenza A by PCR: NEGATIVE
Influenza B by PCR: NEGATIVE
SARS Coronavirus 2 by RT PCR: NEGATIVE

## 2022-01-15 LAB — COMPREHENSIVE METABOLIC PANEL
ALT: 117 U/L — ABNORMAL HIGH (ref 0–44)
AST: 76 U/L — ABNORMAL HIGH (ref 15–41)
Albumin: 2.4 g/dL — ABNORMAL LOW (ref 3.5–5.0)
Alkaline Phosphatase: 344 U/L — ABNORMAL HIGH (ref 38–126)
Anion gap: 9 (ref 5–15)
BUN: 43 mg/dL — ABNORMAL HIGH (ref 8–23)
CO2: 27 mmol/L (ref 22–32)
Calcium: 8.4 mg/dL — ABNORMAL LOW (ref 8.9–10.3)
Chloride: 104 mmol/L (ref 98–111)
Creatinine, Ser: 2.59 mg/dL — ABNORMAL HIGH (ref 0.44–1.00)
GFR, Estimated: 19 mL/min — ABNORMAL LOW (ref >60)
Glucose, Bld: 93 mg/dL (ref 70–99)
Potassium: 5 mmol/L (ref 3.5–5.1)
Sodium: 140 mmol/L (ref 135–145)
Total Bilirubin: 0.4 mg/dL (ref 0.3–1.2)
Total Protein: 6.8 g/dL (ref 6.5–8.1)

## 2022-01-15 LAB — BLOOD GAS, ARTERIAL
Acid-Base Excess: 5.8 mmol/L — ABNORMAL HIGH (ref 0.0–2.0)
Bicarbonate: 31.2 mmol/L — ABNORMAL HIGH (ref 20.0–28.0)
FIO2: 40 %
MECHVT: 500 mL
Mechanical Rate: 16
O2 Saturation: 100 %
PEEP: 5 cmH2O
Patient temperature: 37
pCO2 arterial: 47 mmHg (ref 32–48)
pH, Arterial: 7.43 (ref 7.35–7.45)
pO2, Arterial: 126 mmHg — ABNORMAL HIGH (ref 83–108)

## 2022-01-15 LAB — GLUCOSE, CAPILLARY
Glucose-Capillary: 101 mg/dL — ABNORMAL HIGH (ref 70–99)
Glucose-Capillary: 111 mg/dL — ABNORMAL HIGH (ref 70–99)
Glucose-Capillary: 145 mg/dL — ABNORMAL HIGH (ref 70–99)
Glucose-Capillary: 151 mg/dL — ABNORMAL HIGH (ref 70–99)
Glucose-Capillary: 153 mg/dL — ABNORMAL HIGH (ref 70–99)
Glucose-Capillary: 80 mg/dL (ref 70–99)

## 2022-01-15 LAB — MAGNESIUM: Magnesium: 1.7 mg/dL (ref 1.7–2.4)

## 2022-01-15 LAB — TRIGLYCERIDES: Triglycerides: 43 mg/dL (ref <150)

## 2022-01-15 LAB — PHOSPHORUS: Phosphorus: 5 mg/dL — ABNORMAL HIGH (ref 2.5–4.6)

## 2022-01-15 MED ORDER — CHLORHEXIDINE GLUCONATE 0.12% ORAL RINSE (MEDLINE KIT)
15.0000 mL | Freq: Two times a day (BID) | OROMUCOSAL | Status: DC
  Administered 2022-01-15 – 2022-01-17 (×5): 15 mL via OROMUCOSAL

## 2022-01-15 MED ORDER — SODIUM CHLORIDE 0.9 % IV SOLN: INTRAVENOUS | Status: DC | PRN

## 2022-01-15 MED ORDER — DEXAMETHASONE SODIUM PHOSPHATE 10 MG/ML IJ SOLN
20.0000 mg | INTRAMUSCULAR | Status: AC
  Administered 2022-01-15 – 2022-01-19 (×5): 20 mg via INTRAVENOUS
  Filled 2022-01-15 (×6): qty 2

## 2022-01-15 MED ORDER — PANTOPRAZOLE SODIUM 40 MG IV SOLR
40.0000 mg | INTRAVENOUS | Status: DC
  Administered 2022-01-15: 40 mg via INTRAVENOUS
  Filled 2022-01-15: qty 10

## 2022-01-15 MED ORDER — LIDOCAINE HCL URETHRAL/MUCOSAL 2 % EX GEL
CUTANEOUS | Status: DC | PRN
Start: 2022-01-14 — End: 2022-01-15
  Administered 2022-01-14: 1 via TOPICAL

## 2022-01-15 MED ORDER — CHLORHEXIDINE GLUCONATE CLOTH 2 % EX PADS
6.0000 | MEDICATED_PAD | Freq: Every day | CUTANEOUS | Status: DC
  Administered 2022-01-16 – 2022-01-18 (×2): 6 via TOPICAL

## 2022-01-15 MED ORDER — DEXAMETHASONE SODIUM PHOSPHATE 10 MG/ML IJ SOLN
10.0000 mg | INTRAMUSCULAR | Status: DC
  Filled 2022-01-15: qty 1

## 2022-01-15 MED ORDER — FAMOTIDINE IN NACL 20-0.9 MG/50ML-% IV SOLN
20.0000 mg | INTRAVENOUS | Status: DC
  Administered 2022-01-15 – 2022-01-19 (×5): 20 mg via INTRAVENOUS
  Filled 2022-01-15 (×5): qty 50

## 2022-01-15 MED ORDER — INSULIN GLARGINE-YFGN 100 UNIT/ML ~~LOC~~ SOLN
20.0000 [IU] | Freq: Every day | SUBCUTANEOUS | Status: DC
  Administered 2022-01-15: 20 [IU] via SUBCUTANEOUS
  Filled 2022-01-15 (×2): qty 0.2

## 2022-01-15 MED ORDER — NOREPINEPHRINE 4 MG/250ML-% IV SOLN: 0.0000 ug/min | INTRAVENOUS | Status: DC

## 2022-01-15 MED ORDER — DIPHENHYDRAMINE HCL 50 MG/ML IJ SOLN: 50.0000 mg | Freq: Four times a day (QID) | INTRAMUSCULAR | Status: DC

## 2022-01-15 MED ORDER — DIPHENHYDRAMINE HCL 50 MG/ML IJ SOLN
50.0000 mg | INTRAMUSCULAR | Status: AC
  Administered 2022-01-15 (×4): 50 mg via INTRAVENOUS
  Filled 2022-01-15 (×4): qty 1

## 2022-01-15 MED ORDER — DIPHENHYDRAMINE HCL 50 MG/ML IJ SOLN
25.0000 mg | Freq: Once | INTRAMUSCULAR | Status: AC
  Administered 2022-01-15: 25 mg via INTRAVENOUS
  Filled 2022-01-15: qty 1

## 2022-01-15 MED ORDER — INSULIN ASPART 100 UNIT/ML IJ SOLN
0.0000 [IU] | INTRAMUSCULAR | Status: DC
  Administered 2022-01-15: 4 [IU] via SUBCUTANEOUS
  Administered 2022-01-15: 3 [IU] via SUBCUTANEOUS
  Administered 2022-01-15: 4 [IU] via SUBCUTANEOUS
  Administered 2022-01-16 (×2): 3 [IU] via SUBCUTANEOUS
  Administered 2022-01-17: 4 [IU] via SUBCUTANEOUS
  Administered 2022-01-17: 3 [IU] via SUBCUTANEOUS
  Administered 2022-01-17: 4 [IU] via SUBCUTANEOUS
  Administered 2022-01-18: 7 [IU] via SUBCUTANEOUS
  Filled 2022-01-15 (×9): qty 1

## 2022-01-15 MED ORDER — DIPHENHYDRAMINE HCL 50 MG/ML IJ SOLN
25.0000 mg | Freq: Four times a day (QID) | INTRAMUSCULAR | Status: DC
  Administered 2022-01-15 – 2022-01-17 (×6): 25 mg via INTRAVENOUS
  Filled 2022-01-15 (×6): qty 1

## 2022-01-15 MED ORDER — ORAL CARE MOUTH RINSE
15.0000 mL | OROMUCOSAL | Status: DC
  Administered 2022-01-15 – 2022-01-17 (×21): 15 mL via OROMUCOSAL

## 2022-01-15 NOTE — Progress Notes (Signed)
eLink Physician-Brief Progress Note ?Patient Name: Deanna [name] ?DOB: May 07, 1951 ?MRN: [id number] ? ? ?Date of Service ? 01/15/2022  ?HPI/Events of Note ? 70 yr old woman with likely ACE inhibitor related angioedema . Needed nasal intubation in OR and just arrived to ICU. Is on PRVC 50% and o2 sat 100. VT 500, RR 16, peep 5. Sedated. Has multiple medical problems chronically.   ?eICU Interventions ? D/w ICU NP, they are to admit  ?Recommend IV steroids. H1 and H2 blockers  ?Deep sedation and even paralysis if needed in order to keep the ETT secure , no SBT to be attempted in AM today for now  ?ABG post intubation, adjust vent as needed ?DVT and GI prophylaxis ?Call E link if needed  ? ? ? ?Intervention Category ?Major Interventions: Respiratory failure - evaluation and management ?Evaluation Type: New Patient Evaluation ? ?Johnmark Geiger G Alajiah Dutkiewicz ?01/15/2022, 12:02 AM ?

## 2022-01-15 NOTE — Progress Notes (Signed)
NP at bedside. Instructed to start Propofol at 47mg/kg/min as patient is agitated and is nasally intubated and risk of losing airway is higher. Pt VSS.  ?

## 2022-01-15 NOTE — Progress Notes (Signed)
?[facility]  ?ROUNDING NOTE  ? ?Subjective:  ? ?Deanna [name] is a 71 year old female with past medical histories including hypertension, CAD, CVA, and recently diagnosed end-stage renal disease with during hemodialysis.  Patient presents to the emergency department with facial swelling including mouth and tongue.  Patient has been admitted for Angioedema [T78.3XXA] ? ?Patient is known to our practice from previous admission and was initiated on dialysis during that admission.  Patient was assigned ?to [facility] for outpatient treatments on a MWF schedule, supervised by Dr. Holley Raring.  Patient was discharged home on 01/13/2022.  Per chart review patient had taken her hypertensive medication, lisinopril and immediately felt tongue and mouth swelling.  Patient was home with grand daughter, Deanna [name], during this time.  This is not a new medication for this patient, she has reportedly been taking for years.  Patient had taken Benadryl without symptom relief and then called EMS.  EMS administered IM epinephrine, without relief.  Based on extensive tongue swelling, ENT emergently consulted and patient taken to the OR for nasotracheal intubation with ventilator support. ?Patient seen sedated, intubated ?According to staff, mild improvement with angioedema ? ?We have been consulted to manage dialysis needs during this admission ? ?Objective:  ?Vital signs in last 24 hours:  ?Temp:  [97.9 ?F (36.6 ?C)-98.6 ?F (37 ?C)] 98.6 ?F (37 ?C) (04/23 1200) ?Pulse Rate:  [59-104] 70 (04/23 1300) ?Resp:  [15-40] 16 (04/23 1300) ?BP: (119-169)/(66-99) 120/74 (04/23 1300) ?SpO2:  [98 %-100 %] 100 % (04/23 1300) ?FiO2 (%):  [30 %-40 %] 30 % (04/23 1300) ?Weight:  [84.2 kg-85.7 kg] 85.7 kg (04/23 0500) ? ?Weight change:  ?Filed Weights  ? 01/14/22 2232 01/15/22 0500  ?Weight: 84.2 kg 85.7 kg  ? ? ?Intake/Output: ?I/O last 3 completed shifts: ?In: 799.4 [I.V.:799.4] ?Out: -  ?  ?Intake/Output this shift: ? Total I/O ?In:  266.3 [I.V.:216.3; IV Piggyback:50] ?Out: -  ? ?Physical Exam: ?General: Sedated  ?Head: Massive tongue edema  ?Eyes: Anicteric  ?Lungs:  Ventilator  ?Heart: Regular rate and rhythm  ?Abdomen:  Soft, nontender,   ?Extremities: 1+ peripheral edema.  ?Neurologic: Nonfocal, moving all four extremities  ?Skin: No lesions  ?Access: Right PermCath  ? ? ?Basic Metabolic Panel: ?Recent Labs  ?Lab 01/09/22 ?9735 01/09/22 ?1034 01/10/22 ?1316 01/12/22 ?0400 01/13/22 ?3299 01/14/22 ?2229 01/15/22 ?0200  ?NA 138  --  135 137 137 140 140  ?K 5.9*   < > 5.1 4.3 5.1 5.2* 5.0  ?CL 97*  --  99 102 101 102 104  ?CO2 29  --  _0 ?GLUCOSE 197*  --  258* 75 56* 104* 93  ?BUN 75*  --  56* 41* 66* 44* 43*  ?CREATININE 3.50*  --  2.98* 2.31* 2.89* 2.73* 2.59*  ?CALCIUM 8.5*  --  8.4* 8.4* 8.3* 8.7* 8.4*  ?MG 1.8  --   --   --   --   --  1.7  ?PHOS 5.8*  --  5.0*  --   --   --  5.0*  ? < > = values in this interval not displayed.  ? ? ?Liver Function Tests: ?Recent Labs  ?Lab 01/09/22 ?2426 01/10/22 ?1316 01/12/22 ?0400 01/15/22 ?0200  ?AST  --   --  49* 76*  ?ALT  --   --  125* 117*  ?ALKPHOS  --   --  306* 344*  ?BILITOT  --   --  0.4 0.4  ?PROT  --   --  6.0* 6.8  ?ALBUMIN 2.3* 2.3* 2.0* 2.4*  ? ?No results for input(s): LIPASE, AMYLASE in the last 168 hours. ?No results for input(s): AMMONIA in the last 168 hours. ? ?CBC: ?Recent Labs  ?Lab 01/09/22 ?4680 01/10/22 ?1316 01/13/22 ?3212 01/14/22 ?2229 01/15/22 ?0249  ?WBC 11.0* 8.2 9.0 10.1 6.9  ?NEUTROABS  --   --   --  6.9 6.1  ?HGB 7.0* 8.1* 8.1* 9.3* 8.2*  ?HCT 22.4* 26.3* 26.3* 31.5* 27.5*  ?MCV 87.5 86.2 86.5 91.0 90.2  ?PLT 238 268 241 193 209  ? ? ?Cardiac Enzymes: ?No results for input(s): CKTOTAL, CKMB, CKMBINDEX, TROPONINI in the last 168 hours. ? ?BNP: ?Invalid input(s): POCBNP ? ?CBG: ?Recent Labs  ?Lab 01/13/22 ?1604 01/14/22 ?2357 01/15/22 ?2482 01/15/22 ?0732 01/15/22 ?1103  ?GLUCAP 181* 80 145* 153* 151*  ? ? ?Microbiology: ?Results for orders placed or  performed during the hospital encounter of 01/14/22  ?Resp Panel by RT-PCR (Flu A&B, Covid) Nasopharyngeal Swab     Status: None  ? Collection Time: 01/15/22 10:49 AM  ? Specimen: Nasopharyngeal Swab; Nasopharyngeal(NP) swabs in vial transport medium  ?Result Value Ref Range Status  ? SARS Coronavirus 2 by RT PCR NEGATIVE NEGATIVE Final  ?  Comment: (NOTE) ?SARS-CoV-2 target nucleic acids are NOT DETECTED. ? ?The SARS-CoV-2 RNA is generally detectable in upper respiratory ?specimens during the acute phase of infection. The lowest ?concentration of SARS-CoV-2 viral copies this assay can detect is ?138 copies/mL. A negative result does not preclude SARS-Cov-2 ?infection and should not be used as the sole basis for treatment or ?other patient management decisions. A negative result may occur with  ?improper specimen collection/handling, submission of specimen other ?than nasopharyngeal swab, presence of viral mutation(s) within the ?areas targeted by this assay, and inadequate number of viral ?copies(<138 copies/mL). A negative result must be combined with ?clinical observations, patient history, and epidemiological ?information. The expected result is Negative. ? ?Fact Sheet for Patients:  ?EntrepreneurPulse.com.au ? ?Fact Sheet for Healthcare Providers:  ?IncredibleEmployment.be ? ?This test is no t yet approved or cleared by the [place] FDA and  ?has been authorized for detection and/or diagnosis of SARS-CoV-2 by ?FDA under an Emergency Use Authorization (EUA). This EUA will remain  ?in effect (meaning this test can be used) for the duration of the ?COVID-19 declaration under Section 564(b)(1) of the Act, 21 ?U.S.C.section 360bbb-3(b)(1), unless the authorization is terminated  ?or revoked sooner.  ? ? ?  ? Influenza A by PCR NEGATIVE NEGATIVE Final  ? Influenza B by PCR NEGATIVE NEGATIVE Final  ?  Comment: (NOTE) ?The Xpert Xpress SARS-CoV-2/FLU/RSV plus assay is intended as  an aid ?in the diagnosis of influenza from Nasopharyngeal swab specimens and ?should not be used as a sole basis for treatment. Nasal washings and ?aspirates are unacceptable for Xpert Xpress SARS-CoV-2/FLU/RSV ?testing. ? ?Fact Sheet for Patients: ?EntrepreneurPulse.com.au ? ?Fact Sheet for Healthcare Providers: ?IncredibleEmployment.be ? ?This test is not yet approved or cleared by the [place] FDA and ?has been authorized for detection and/or diagnosis of SARS-CoV-2 by ?FDA under an Emergency Use Authorization (EUA). This EUA will remain ?in effect (meaning this test can be used) for the duration of the ?COVID-19 declaration under Section 564(b)(1) of the Act, 21 U.S.C. ?section 360bbb-3(b)(1), unless the authorization is terminated or ?revoked. ? ?Performed at [facility], [place], ?[place] [address] ?  ? ? ?Coagulation Studies: ?No results for input(s): LABPROT, INR in the last 72 hours. ? ?Urinalysis: ?  No results for input(s): COLORURINE, LABSPEC, [place], GLUCOSEU, HGBUR, BILIRUBINUR, KETONESUR, PROTEINUR, UROBILINOGEN, NITRITE, LEUKOCYTESUR in the last 72 hours. ? ?Invalid input(s): APPERANCEUR  ? ? ?Imaging: ?DG Chest Port 1 View ? ?Result Date: 01/15/2022 ?CLINICAL DATA:  Nasal intubation EXAM: PORTABLE CHEST 1 VIEW COMPARISON:  None. FINDINGS: Endotracheal tube tip is 3.6 cm above the carina. Cardiomegaly, vascular congestion. Bibasilar atelectasis or infiltrates. Right dialysis catheter remains in place, unchanged. IMPRESSION: Endotracheal tube tip 3.6 cm above the carina. Cardiomegaly with vascular congestion and bibasilar atelectasis or infiltrates. Electronically Signed   By: Rolm Baptise M.D.   On: 01/15/2022 00:42   ? ? ?Medications:  ? ? famotidine (PEPCID) IV 20 mg (01/15/22 8088)  ? fentaNYL infusion INTRAVENOUS 75 mcg/hr (01/15/22 1300)  ? norepinephrine (LEVOPHED) Adult infusion    ? propofol (DIPRIVAN) infusion 50 mcg/kg/min  (01/15/22 1300)  ? ? chlorhexidine gluconate (MEDLINE KIT)  15 mL Mouth Rinse BID  ? Chlorhexidine Gluconate Cloth  6 each Topical Daily  ? dexamethasone (DECADRON) injection  20 mg Intravenous Q24H  ? [place]

## 2022-01-15 NOTE — Anesthesia Postprocedure Evaluation (Signed)
Anesthesia Post Note ? ?Patient: Deanna [name] ? ?Procedure(s) Performed: INTUBATION-NASAL WITH TRACHEOSTOMY STANDBY (Left) ? ?Patient location during evaluation: ICU ?Anesthesia Type: General ?Level of consciousness: sedated ?Pain management: pain level controlled ?Vital Signs Assessment: post-procedure vital signs reviewed and stable ?Respiratory status: respiratory function stable, patient remains intubated per anesthesia plan and patient on ventilator - see flowsheet for VS ?Cardiovascular status: blood pressure returned to baseline and stable ?Postop Assessment: no apparent nausea or vomiting ?Anesthetic complications: no ? ? ?No notable events documented. ? ? ?Last Vitals:  ?Vitals:  ? 01/14/22 2223 01/14/22 2231  ?BP:  (!) 165/99  ?Pulse:  (!) 104  ?Resp:  (!) 40  ?SpO2: 100% 99%  ?  ?Last Pain:  ?Vitals:  ? 01/14/22 2232  ?PainSc: 0-No pain  ? ? ?  ?  ?  ?  ?  ?  ? ?[place] Ouch ? ? ? ? ?

## 2022-01-15 NOTE — Anesthesia Procedure Notes (Signed)
Procedure Name: Intubation ?Date/Time: 01/14/2022 11:37 PM ?Performed by: Beverly Gust, MD ?Pre-anesthesia Checklist: Patient identified, Emergency Drugs available, Suction available and Patient being monitored ?Patient Re-evaluated:Patient Re-evaluated prior to induction ?Oxygen Delivery Method: Circle system utilized ?Preoxygenation: Pre-oxygenation with 100% oxygen ?Nasal Tubes: Nasal Rae and Left ?Tube size: 6.5 mm ?Number of attempts: 1 ?Airway Equipment and Method: Fiberoptic brochoscope ?Placement Confirmation: ETT inserted through vocal cords under direct vision, positive ETCO2 and breath sounds checked- equal and bilateral ?Tube secured with: Tape ?Dental Injury: Teeth and Oropharynx as per pre-operative assessment  ?Comments: Emergency airway.  Awake fiberoptic by surgeon ? ? ? ? ?

## 2022-01-15 NOTE — ED Provider Notes (Signed)
? ?[facility] ?Provider Note ? ? ? Event Date/Time  ? First MD Initiated Contact with Patient 01/14/22 2350   ?  (approximate) ? ? ?History  ? ?Angioedema and Allergic Reaction (w/Angioedema) ? ? ?HPI ? ?Deanna [name] is a 71 y.o. female  who, per discharge summary dated from 2 days ago had an admission for right sided weakness, who presents to the emergency department today because of concern for tongue swelling. States it started shortly after taking her dose of lisinopril tonight. She denies similar symptoms in the past. EMS administered IM epinephrine.   ? ? ?Physical Exam  ? ?Triage Vital Signs: ?ED Triage Vitals  ?Enc Vitals Group  ?   BP 01/14/22 2231 (!) 165/99  ?   Pulse Rate 01/14/22 2231 (!) 104  ?   Resp 01/14/22 2231 (!) 40  ?   Temp --   ?   Temp src --   ?   SpO2 01/14/22 2223 100 %  ?   Weight 01/14/22 2232 185 lb 11.2 oz (84.2 kg)  ?   Height 01/14/22 2232 '6\' 1"'$  (1.854 m)  ?   Head Circumference --   ?   Peak Flow --   ?   Pain Score 01/14/22 2232 0  ? ?Most recent vital signs: ?Vitals:  ? 01/14/22 2223 01/14/22 2231  ?BP:  (!) 165/99  ?Pulse:  (!) 104  ?Resp:  (!) 40  ?SpO2: 100% 99%  ? ? ?General: Awake, alert. ?CV:  Good peripheral perfusion.  ?Resp:  Normal effort.  ?Abd:  No distention.  ?ENT:  Significant tongue swelling. ? ? ?ED Results / Procedures / Treatments  ? ?Labs ?(all labs ordered are listed, but only abnormal results are displayed) ?Labs Reviewed  ?BASIC METABOLIC PANEL - Abnormal; Notable for the following components:  ?    Result Value  ? Potassium 5.2 (*)   ? Glucose, Bld 104 (*)   ? BUN 44 (*)   ? Creatinine, Ser 2.73 (*)   ? Calcium 8.7 (*)   ? GFR, Estimated 18 (*)   ? All other components within normal limits  ?GLUCOSE, CAPILLARY  ?CBC WITH DIFFERENTIAL/PLATELET  ?TRIGLYCERIDES  ?BLOOD GAS, ARTERIAL  ?COMPREHENSIVE METABOLIC PANEL  ?MAGNESIUM  ?PHOSPHORUS  ? ? ? ?EKG ? ?INance Pear, attending physician, personally viewed and interpreted this  EKG ? ?EKG Time: 2228 ?Rate: 109 ?Rhythm: sinus tachycardia ?Axis: normal ?Intervals: qtc 493 ?QRS: q waves v1 ?ST changes: no st elevation ?Impression: abnormal ekg ? ?RADIOLOGY ?None ? ?PROCEDURES: ? ?Critical Care performed: No ? ?Procedures ? ? ?MEDICATIONS ORDERED IN ED: ?Medications  ?oxymetazoline (AFRIN) 0.05 % nasal spray 1 spray (1 spray Each Nare Not Given 01/14/22 2254)  ?docusate (COLACE) 50 MG/5ML liquid 100 mg (has no administration in time range)  ?polyethylene glycol (MIRALAX / GLYCOLAX) packet 17 g (has no administration in time range)  ?fentaNYL (SUBLIMAZE) injection 25 mcg (has no administration in time range)  ?fentaNYL 2544mg in NS 2527m(1010mml) infusion-PREMIX (has no administration in time range)  ?fentaNYL (SUBLIMAZE) bolus via infusion 25-100 mcg (has no administration in time range)  ?propofol (DIPRIVAN) 1000 MG/100ML infusion (50 mcg/kg/min ? 84.2 kg Intravenous New Bag/Given 01/15/22 0007)  ?midazolam (VERSED) injection 1 mg (1 mg Intravenous Given 01/15/22 0012)  ?midazolam (VERSED) injection 1 mg (has no administration in time range)  ?docusate sodium (COLACE) capsule 100 mg (has no administration in time range)  ?polyethylene glycol (MIRALAX / GLYCOLAX) packet 17 g (has  no administration in time range)  ?pantoprazole sodium (PROTONIX) 40 mg/20 mL oral suspension 40 mg (has no administration in time range)  ?heparin injection 5,000 Units (has no administration in time range)  ?famotidine (PEPCID) IVPB 20 mg premix (has no administration in time range)  ?diphenhydrAMINE (BENADRYL) injection 50 mg (has no administration in time range)  ?  Followed by  ?diphenhydrAMINE (BENADRYL) injection 50 mg (has no administration in time range)  ?dexamethasone (DECADRON) injection 20 mg (has no administration in time range)  ?  Followed by  ?dexamethasone (DECADRON) injection 10 mg (has no administration in time range)  ?insulin aspart (novoLOG) injection 0-20 Units (has no administration in time  range)  ?norepinephrine (LEVOPHED) '4mg'$  in 296m (0.016 mg/mL) premix infusion (has no administration in time range)  ?tranexamic acid (CYKLOKAPRON) IVPB 1,000 mg (0 mg Intravenous Stopped 01/14/22 2252)  ?methylPREDNISolone sodium succinate (SOLU-MEDROL) 125 mg/2 mL injection 125 mg (125 mg Intravenous Given 01/14/22 2234)  ?diphenhydrAMINE (BENADRYL) injection 25 mg (25 mg Intravenous Given 01/14/22 2233)  ?oxymetazoline (AFRIN) 0.05 % nasal spray 1 spray (1 spray Each Nare Given 01/14/22 2252)  ?famotidine (PEPCID) IVPB 20 mg premix (20 mg Intravenous New Bag/Given 01/14/22 2255)  ?diphenhydrAMINE (BENADRYL) injection 25 mg (25 mg Intravenous Given 01/15/22 0013)  ? ? ? ?IMPRESSION / MDM / ASSESSMENT AND PLAN / ED COURSE  ?I reviewed the triage vital signs and the nursing notes. ?             ?               ? ?Differential diagnosis includes, but is not limited to, angioedema.  ? ?Patient presented to the emergency department today because of concerns for tongue swelling.  Patient is on lisinopril.  On exam patient did have significant anterior lung swelling.  So much so that her teeth were starting to cut into her tongue.  I did consult Dr. MTami Ribaswith ENT given concern for posterior oropharynx swelling and potential airway compromise.  Dr. MTami Ribasevaluated the patient and took the patient emergently to the operating room for nasotracheal intubation. ? ?FINAL CLINICAL IMPRESSION(S) / ED DIAGNOSES  ? ?Final diagnoses:  ?Angioedema  ? ? ? ?Note:  This document was prepared using Dragon voice recognition software and may include unintentional dictation errors. ? ?  ?GNance Pear MD ?01/15/22 0025 ? ?

## 2022-01-15 NOTE — Progress Notes (Signed)
Patient's granddaughter, Forde Dandy, came to the unit and took the patients belongings (purse and medications brought to hospital) back home.  Granddaughter stated she was leaving the patient's clothes.   ?

## 2022-01-15 NOTE — Procedures (Signed)
Central Venous Catheter Insertion Procedure Note ? ?Deanna [name]  ?[id number]  ?[birth year] ? ?Date:01/15/22  ?Time:2:07 AM  ? ?Provider Performing:Denys Labree L Rust-Chester  ? ?Procedure: Insertion of Non-tunneled Central Venous Catheter(36556) with US guidance (52841)  ? ?Indication(s) ?Medication administration and Difficult access ? ?Consent ?Risks of the procedure as well as the alternatives and risks of each were explained to the patient and/or caregiver.  Consent for the procedure was obtained and is signed in the bedside chart ? ?Anesthesia ?Topical only with 1% lidocaine , fentanyl and propofol drips ? ?Timeout ?Verified patient identification, verified procedure, site/side was marked, verified correct patient position, special equipment/implants available, medications/allergies/relevant history reviewed, required imaging and test results available. ? ?Sterile Technique ?Maximal sterile technique including full sterile barrier drape, hand hygiene, sterile gown, sterile gloves, mask, hair covering, sterile ultrasound probe cover (if used). ? ?Procedure Description ?Area of catheter insertion was cleaned with chlorhexidine and draped in sterile fashion.  With real-time ultrasound guidance a central venous catheter was placed into the left femoral vein. Nonpulsatile blood flow and easy flushing noted in all ports.  The catheter was sutured in place and sterile dressing applied. ? ?Complications/Tolerance ?None; patient tolerated the procedure well. ?Chest X-ray is ordered to verify placement for internal jugular or subclavian cannulation.   Chest x-ray is not ordered for femoral cannulation. ? ?EBL ?Minimal ? ?Specimen(s) ?None ? ?Domingo Pulse Rust-Chester, AGACNP-BC ?Acute Care Nurse Practitioner ?[facility]  ? ?[phone] / [phone] ?Please see Amion for pager details.  ? ?

## 2022-01-15 NOTE — Progress Notes (Addendum)
Nurse did bladder scan and patient noted to have retention of urine over 900 cc, Foley ordered. ? ?C. Derrill Kay, MD ?Advanced Bronchoscopy ?[facility] ? ?

## 2022-01-15 NOTE — Progress Notes (Signed)
01/15/2022 ?9:35 AM ? ?Deanna [name], Deanna [name] ?[id number] ? ?Post-Op Day 1 ? ? ? Temp:  [97.9 ?F (36.6 ?C)] 97.9 ?F (36.6 ?C) (04/23 0400) ?Pulse Rate:  [59-104] 64 (04/23 0800) ?Resp:  [15-40] 16 (04/23 0800) ?BP: (119-169)/(66-99) 130/76 (04/23 0800) ?SpO2:  [98 %-100 %] 98 % (04/23 0800) ?FiO2 (%):  [30 %-40 %] 30 % (04/23 0832) ?Weight:  [84.2 kg-85.7 kg] 85.7 kg (04/23 0500),  ? ? ? ?Intake/Output Summary (Last 24 hours) at 01/15/2022 0935 ?Last data filed at 01/15/2022 0800 ?Gross per 24 hour  ?Intake 843.79 ml  ?Output --  ?Net 843.79 ml  ? ? ?Results for orders placed or performed during the hospital encounter of 01/14/22 (from the past 24 hour(s))  ?CBC with Differential     Status: Abnormal  ? Collection Time: 01/14/22 10:29 PM  ?Result Value Ref Range  ? WBC 10.1 4.0 - 10.5 K/uL  ? RBC 3.46 (L) 3.87 - 5.11 MIL/uL  ? Hemoglobin 9.3 (L) 12.0 - 15.0 g/dL  ? HCT 31.5 (L) 36.0 - 46.0 %  ? MCV 91.0 80.0 - 100.0 fL  ? MCH 26.9 26.0 - 34.0 pg  ? MCHC 29.5 (L) 30.0 - 36.0 g/dL  ? RDW 15.3 11.5 - 15.5 %  ? Platelets 193 150 - 400 K/uL  ? nRBC 0.4 (H) 0.0 - 0.2 %  ? Neutrophils Relative % 69 %  ? Neutro Abs 6.9 1.7 - 7.7 K/uL  ? Lymphocytes Relative 19 %  ? Lymphs Abs 1.9 0.7 - 4.0 K/uL  ? Monocytes Relative 10 %  ? Monocytes Absolute 1.0 0.1 - 1.0 K/uL  ? Eosinophils Relative 1 %  ? Eosinophils Absolute 0.1 0.0 - 0.5 K/uL  ? Basophils Relative 0 %  ? Basophils Absolute 0.0 0.0 - 0.1 K/uL  ? Immature Granulocytes 1 %  ? Abs Immature Granulocytes 0.13 (H) 0.00 - 0.07 K/uL  ?Basic metabolic panel     Status: Abnormal  ? Collection Time: 01/14/22 10:29 PM  ?Result Value Ref Range  ? Sodium 140 135 - 145 mmol/L  ? Potassium 5.2 (H) 3.5 - 5.1 mmol/L  ? Chloride 102 98 - 111 mmol/L  ? CO2 26 22 - 32 mmol/L  ? Glucose, Bld 104 (H) 70 - 99 mg/dL  ? BUN 44 (H) 8 - 23 mg/dL  ? Creatinine, Ser 2.73 (H) 0.44 - 1.00 mg/dL  ? Calcium 8.7 (L) 8.9 - 10.3 mg/dL  ? GFR, Estimated 18 (L) >60 mL/min  ? Anion gap 12 5 - 15  ?Glucose, capillary      Status: None  ? Collection Time: 01/14/22 11:57 PM  ?Result Value Ref Range  ? Glucose-Capillary 80 70 - 99 mg/dL  ?Draw ABG 1 hour after initiation of ventilator     Status: Abnormal  ? Collection Time: 01/15/22 12:34 AM  ?Result Value Ref Range  ? FIO2 40 %  ? Delivery systems VENTILATOR   ? Mode PRESSURE REGULATED VOLUME CONTROL   ? MECHVT 500 mL  ? PEEP 5 cm H20  ? pH, Arterial 7.43 7.35 - 7.45  ? pCO2 arterial 47 32 - 48 mmHg  ? pO2, Arterial 126 (H) 83 - 108 mmHg  ? Bicarbonate 31.2 (H) 20.0 - 28.0 mmol/L  ? Acid-Base Excess 5.8 (H) 0.0 - 2.0 mmol/L  ? O2 Saturation 100 %  ? Patient temperature 37.0   ? Collection site RIGHT RADIAL   ? Allens test (pass/fail) PASS PASS  ? Mechanical Rate 16   ?  Comprehensive metabolic panel     Status: Abnormal  ? Collection Time: 01/15/22  2:00 AM  ?Result Value Ref Range  ? Sodium 140 135 - 145 mmol/L  ? Potassium 5.0 3.5 - 5.1 mmol/L  ? Chloride 104 98 - 111 mmol/L  ? CO2 27 22 - 32 mmol/L  ? Glucose, Bld 93 70 - 99 mg/dL  ? BUN 43 (H) 8 - 23 mg/dL  ? Creatinine, Ser 2.59 (H) 0.44 - 1.00 mg/dL  ? Calcium 8.4 (L) 8.9 - 10.3 mg/dL  ? Total Protein 6.8 6.5 - 8.1 g/dL  ? Albumin 2.4 (L) 3.5 - 5.0 g/dL  ? AST 76 (H) 15 - 41 U/L  ? ALT 117 (H) 0 - 44 U/L  ? Alkaline Phosphatase 344 (H) 38 - 126 U/L  ? Total Bilirubin 0.4 0.3 - 1.2 mg/dL  ? GFR, Estimated 19 (L) >60 mL/min  ? Anion gap 9 5 - 15  ?Magnesium     Status: None  ? Collection Time: 01/15/22  2:00 AM  ?Result Value Ref Range  ? Magnesium 1.7 1.7 - 2.4 mg/dL  ?Phosphorus     Status: Abnormal  ? Collection Time: 01/15/22  2:00 AM  ?Result Value Ref Range  ? Phosphorus 5.0 (H) 2.5 - 4.6 mg/dL  ?CBC with Differential/Platelet     Status: Abnormal  ? Collection Time: 01/15/22  2:49 AM  ?Result Value Ref Range  ? WBC 6.9 4.0 - 10.5 K/uL  ? RBC 3.05 (L) 3.87 - 5.11 MIL/uL  ? Hemoglobin 8.2 (L) 12.0 - 15.0 g/dL  ? HCT 27.5 (L) 36.0 - 46.0 %  ? MCV 90.2 80.0 - 100.0 fL  ? MCH 26.9 26.0 - 34.0 pg  ? MCHC 29.8 (L) 30.0 - 36.0 g/dL   ? RDW 15.3 11.5 - 15.5 %  ? Platelets 209 150 - 400 K/uL  ? nRBC 0.3 (H) 0.0 - 0.2 %  ? Neutrophils Relative % 88 %  ? Neutro Abs 6.1 1.7 - 7.7 K/uL  ? Lymphocytes Relative 8 %  ? Lymphs Abs 0.5 (L) 0.7 - 4.0 K/uL  ? Monocytes Relative 3 %  ? Monocytes Absolute 0.2 0.1 - 1.0 K/uL  ? Eosinophils Relative 0 %  ? Eosinophils Absolute 0.0 0.0 - 0.5 K/uL  ? Basophils Relative 0 %  ? Basophils Absolute 0.0 0.0 - 0.1 K/uL  ? Immature Granulocytes 1 %  ? Abs Immature Granulocytes 0.08 (H) 0.00 - 0.07 K/uL  ?Glucose, capillary     Status: Abnormal  ? Collection Time: 01/15/22  3:55 AM  ?Result Value Ref Range  ? Glucose-Capillary 145 (H) 70 - 99 mg/dL  ?Triglycerides     Status: None  ? Collection Time: 01/15/22  6:31 AM  ?Result Value Ref Range  ? Triglycerides 43 <150 mg/dL  ?Glucose, capillary     Status: Abnormal  ? Collection Time: 01/15/22  7:32 AM  ?Result Value Ref Range  ? Glucose-Capillary 153 (H) 70 - 99 mg/dL  ? ? ?SUBJECTIVE: Patient intubated and sedated ? ?OBJECTIVE: Tongue remains swollen protruding from the oral cavity patient intubated and sedated ? ?IMPRESSION: Angioedema likely related to ACE inhibitor ? ?PLAN: Continue to be intubated and sedated until this angioedema resolved, would not attempt extubation until this tongue swelling has significantly improved, I will sign off for now if there are any further questions feel free to contact me. ? ?[name] ?01/15/2022, 9:35 AM ? ?  ?

## 2022-01-15 NOTE — Plan of Care (Signed)
Continuing with plan of care. 

## 2022-01-15 NOTE — Plan of Care (Signed)
  Problem: Activity: Goal: Ability to tolerate increased activity will improve Outcome: Progressing   Problem: Respiratory: Goal: Ability to maintain a clear airway and adequate ventilation will improve Outcome: Progressing   Problem: Role Relationship: Goal: Method of communication will improve Outcome: Progressing   

## 2022-01-15 NOTE — Transfer of Care (Signed)
Immediate Anesthesia Transfer of Care Note ? ?Patient: Deanna [name] ? ?Procedure(s) Performed: INTUBATION-NASAL WITH TRACHEOSTOMY STANDBY (Left) ? ?Patient Location: PACU and ICU ? ?Anesthesia Type:MAC and General ? ?Level of Consciousness: sedated and Patient remains intubated per anesthesia plan ? ?Airway & Oxygen Therapy: Patient remains intubated per anesthesia plan and Patient placed on Ventilator (see vital sign flow sheet for setting) ? ?Post-op Assessment: Report given to RN and Post -op Vital signs reviewed and stable ? ?Post vital signs: Reviewed and stable ? ?Last Vitals:  ?Vitals Value Taken Time  ?BP 126/68 01/14/22 1152  ?Temp  01/14/22 1152  ?Pulse 77 01/14/22 1152  ?RR 18 01/14/22 1152  ?SpO2 100 01/14/22 1152  ?Vitals shown include unvalidated device data. ? ?Last Pain:  ?Vitals:  ? 01/14/22 2232  ?PainSc: 0-No pain  ?   ? ?  ? ?Complications: No notable events documented. ?

## 2022-01-15 NOTE — Addendum Note (Signed)
Addendum  created 01/15/22 0118 by Iran Ouch, MD  ? Intraprocedure Meds edited  ?  ?

## 2022-01-15 NOTE — Consult Note (Signed)
PHARMACY CONSULT NOTE - FOLLOW UP ? ?Pharmacy Consult for Electrolyte Monitoring and Replacement  ? ?Recent Labs: ?Potassium (mmol/L)  ?Date Value  ?01/15/2022 5.0  ? ?Magnesium (mg/dL)  ?Date Value  ?01/15/2022 1.7  ? ?Calcium (mg/dL)  ?Date Value  ?01/15/2022 8.4 (L)  ? ?Albumin (g/dL)  ?Date Value  ?01/15/2022 2.4 (L)  ? ?Phosphorus (mg/dL)  ?Date Value  ?01/15/2022 5.0 (H)  ? ?Sodium (mmol/L)  ?Date Value  ?01/15/2022 140  ? ? ? ?Assessment: ?71 yo F presenting to [facility] ED on 01/14/22 from home via EMS with complaints of angioedema due to ACEI.  ? ?Goal of Therapy:  ?WNL ? ?Plan:  ?No replacement required at this time.  ?F/u with AM labs.  ? ?Oswald Hillock ,PharmD ?Clinical Pharmacist ?01/15/2022 7:53 AM ? ?

## 2022-01-15 NOTE — Plan of Care (Signed)
?  Problem: Activity: ?Goal: Ability to tolerate increased activity will improve ?01/15/2022 0551 by Edwina Barth, RN ?Outcome: Progressing ?01/15/2022 0550 by Edwina Barth, RN ?Outcome: Progressing ?  ?Problem: Respiratory: ?Goal: Ability to maintain a clear airway and adequate ventilation will improve ?01/15/2022 0551 by Edwina Barth, RN ?Outcome: Progressing ?01/15/2022 0550 by Edwina Barth, RN ?Outcome: Progressing ?  ?Problem: Role Relationship: ?Goal: Method of communication will improve ?01/15/2022 0551 by Edwina Barth, RN ?Outcome: Progressing ?01/15/2022 0550 by Edwina Barth, RN ?Outcome: Progressing ?  ?Problem: Education: ?Goal: Ability to demonstrate management of disease process will improve ?Outcome: Progressing ?Goal: Ability to verbalize understanding of medication therapies will improve ?Outcome: Progressing ?Goal: Individualized Educational Video(s) ?Outcome: Progressing ?  ?Problem: Activity: ?Goal: Capacity to carry out activities will improve ?Outcome: Progressing ?  ?Problem: Cardiac: ?Goal: Ability to achieve and maintain adequate cardiopulmonary perfusion will improve ?Outcome: Progressing ?  ?

## 2022-01-16 DIAGNOSIS — J9601 Acute respiratory failure with hypoxia: Secondary | ICD-10-CM | POA: Diagnosis not present

## 2022-01-16 DIAGNOSIS — T783XXA Angioneurotic edema, initial encounter: Secondary | ICD-10-CM | POA: Diagnosis not present

## 2022-01-16 LAB — BASIC METABOLIC PANEL
Anion gap: 10 (ref 5–15)
BUN: 53 mg/dL — ABNORMAL HIGH (ref 8–23)
CO2: 23 mmol/L (ref 22–32)
Calcium: 8.3 mg/dL — ABNORMAL LOW (ref 8.9–10.3)
Chloride: 104 mmol/L (ref 98–111)
Creatinine, Ser: 3.45 mg/dL — ABNORMAL HIGH (ref 0.44–1.00)
GFR, Estimated: 14 mL/min — ABNORMAL LOW (ref >60)
Glucose, Bld: 146 mg/dL — ABNORMAL HIGH (ref 70–99)
Potassium: 6.3 mmol/L (ref 3.5–5.1)
Sodium: 137 mmol/L (ref 135–145)

## 2022-01-16 LAB — GLUCOSE, CAPILLARY
Glucose-Capillary: 101 mg/dL — ABNORMAL HIGH (ref 70–99)
Glucose-Capillary: 111 mg/dL — ABNORMAL HIGH (ref 70–99)
Glucose-Capillary: 126 mg/dL — ABNORMAL HIGH (ref 70–99)
Glucose-Capillary: 144 mg/dL — ABNORMAL HIGH (ref 70–99)
Glucose-Capillary: 71 mg/dL (ref 70–99)
Glucose-Capillary: 76 mg/dL (ref 70–99)
Glucose-Capillary: 88 mg/dL (ref 70–99)
Glucose-Capillary: 95 mg/dL (ref 70–99)
Glucose-Capillary: 95 mg/dL (ref 70–99)

## 2022-01-16 LAB — C4 COMPLEMENT: Complement C4, Body Fluid: 35 mg/dL (ref 12–38)

## 2022-01-16 LAB — PHOSPHORUS: Phosphorus: 6.6 mg/dL — ABNORMAL HIGH (ref 2.5–4.6)

## 2022-01-16 LAB — C1 ESTERASE INHIBITOR: C1INH SerPl-mCnc: 47 mg/dL — ABNORMAL HIGH (ref 21–39)

## 2022-01-16 LAB — MAGNESIUM: Magnesium: 1.7 mg/dL (ref 1.7–2.4)

## 2022-01-16 LAB — CBC
HCT: 28.3 % — ABNORMAL LOW (ref 36.0–46.0)
Hemoglobin: 8.5 g/dL — ABNORMAL LOW (ref 12.0–15.0)
MCH: 27.1 pg (ref 26.0–34.0)
MCHC: 30 g/dL (ref 30.0–36.0)
MCV: 90.1 fL (ref 80.0–100.0)
Platelets: 251 10*3/uL (ref 150–400)
RBC: 3.14 MIL/uL — ABNORMAL LOW (ref 3.87–5.11)
RDW: 15.6 % — ABNORMAL HIGH (ref 11.5–15.5)
WBC: 8.2 10*3/uL (ref 4.0–10.5)
nRBC: 0.2 % (ref 0.0–0.2)

## 2022-01-16 LAB — HEPATITIS B SURFACE ANTIBODY, QUANTITATIVE: Hep B S AB Quant (Post): 4.3 m[IU]/mL — ABNORMAL LOW (ref >9.9)

## 2022-01-16 LAB — POTASSIUM
Potassium: 6.4 mmol/L (ref 3.5–5.1)
Potassium: 4 mmol/L (ref 3.5–5.1)

## 2022-01-16 MED ORDER — HEPARIN SODIUM (PORCINE) 1000 UNIT/ML IJ SOLN
INTRAMUSCULAR | Status: AC
  Filled 2022-01-16: qty 10

## 2022-01-16 MED ORDER — DEXTROSE 50 % IV SOLN
1.0000 | Freq: Once | INTRAVENOUS | Status: AC
  Administered 2022-01-16: 50 mL via INTRAVENOUS
  Filled 2022-01-16: qty 50

## 2022-01-16 MED ORDER — LIDOCAINE HCL (PF) 1 % IJ SOLN
5.0000 mL | INTRAMUSCULAR | Status: DC | PRN
  Filled 2022-01-16: qty 5

## 2022-01-16 MED ORDER — EPOETIN ALFA 10000 UNIT/ML IJ SOLN
10000.0000 [IU] | INTRAMUSCULAR | Status: DC
  Administered 2022-01-16 – 2022-01-18 (×2): 10000 [IU] via INTRAVENOUS
  Filled 2022-01-16: qty 1

## 2022-01-16 MED ORDER — ALTEPLASE 2 MG IJ SOLR: 2.0000 mg | Freq: Once | INTRAMUSCULAR | Status: DC | PRN

## 2022-01-16 MED ORDER — SODIUM CHLORIDE 0.9 % IV SOLN: 100.0000 mL | INTRAVENOUS | Status: DC | PRN

## 2022-01-16 MED ORDER — HEPARIN SODIUM (PORCINE) 1000 UNIT/ML DIALYSIS: 1000.0000 [IU] | INTRAMUSCULAR | Status: DC | PRN

## 2022-01-16 MED ORDER — LIDOCAINE-PRILOCAINE 2.5-2.5 % EX CREA: 1.0000 "application " | TOPICAL_CREAM | CUTANEOUS | Status: DC | PRN

## 2022-01-16 MED ORDER — PENTAFLUOROPROP-TETRAFLUOROETH EX AERO: 1.0000 "application " | INHALATION_SPRAY | CUTANEOUS | Status: DC | PRN

## 2022-01-16 MED ORDER — DEXTROSE 10 % IV SOLN: INTRAVENOUS | Status: DC

## 2022-01-16 MED ORDER — INSULIN ASPART 100 UNIT/ML IV SOLN
10.0000 [IU] | Freq: Once | INTRAVENOUS | Status: AC
  Administered 2022-01-16: 10 [IU] via INTRAVENOUS
  Filled 2022-01-16: qty 0.1

## 2022-01-16 MED ORDER — DEXTROSE 5 % IV SOLN: INTRAVENOUS | Status: DC

## 2022-01-16 NOTE — Progress Notes (Signed)
Per Dr Mortimer Fries added dextrose infusion. Patient blood sugar 76.  ?

## 2022-01-16 NOTE — H&P (Signed)
? ?NAMEKemari [name], MRN:  [id number], DOB:  June 21, 1951, LOS: 2 ?ADMISSION DATE:  01/14/2022, CONSULTATION DATE:  01/15/22 ?REFERRING MD:  Dr. Suanne Marker, CHIEF COMPLAINT:  Angioedema  ? ?History of Present Illness:  ?71 yo F presenting to [facility] ED on 01/14/22 from home via EMS with complaints of angioedema. Per the patient's granddaughter, Deanna [name], the patient has been taking lisinopril for many years and after taking this medication tonight along with her other night time medications immediately felt swelling of her tongue and in her mouth. They attempted to treat at home with benadryl, when that didn't work EMS was called. EMS administered IM epinephrine. ?ED course: ?Upon arrival to ED patient noted to have significant tongue swelling, so much that her teeth started to cut into her tongue. Dr. Tami Ribas from ENT was emergently consulted. Patient was taken to the OR for nasotracheal intubation on mechanical ventilatory support.  ?Medications given: Benadryl 25 mg, Pepcid 20 mg, solumedrol 125 mg, afrin spray, tranexamic acid ?Initial Vitals: tachypneic 40, tachycardic 104, hypertensive 165/99 & SpO2 99% on RA ?Significant labs: (Labs/ Imaging personally reviewed) ?I, Domingo Pulse [place], AGACNP-BC, personally viewed and interpreted this ECG. ?EKG Interpretation: Date: 01/14/22, EKG Time: 22:28, Rate: 109, Rhythm: ST, QRS Axis: borderline LAD, Intervals: borderline prolonged QTc, ST/T Wave abnormalities: mild inferolateral ST depression (similar to 12/26/21), Narrative Interpretation: ST ?Chemistry: Na+: 140, K+: 5.2, BUN/Cr.: 44/2.73, Serum CO2/ AG: 26/12 ?Hematology: WBC: 10, Hgb: 9.3,  ?COVID-19 & Influenza A/B: pending ?ABG: pending ?CXR : pending ? ?PCCM consulted for admission due to angioedema requiring emergent nasotracheal intubation and mechanical ventilatory support. ? ?Of note patient was recently hospitalized from 12/26/21-01/13/22 due to complaints of right sided weakness. During that  admission she was treated for a LTRI from parainfluenza, a small CVA was discovered as well as Foraminal stenoses- neither requiring intervention inpatient. Patient also had AKI on CKD requiring iHD during admission. She was followed by nephrology, cardiology, neurology, neurosurgery, PCCM & TRH. Discharged home to her granddaughter on 01/13/22. ?Pertinent  Medical History  ?HFrEF (LVEF 30%, LV global hypokinesis, G1DD) ?CAD ?T2DM ?HTN ?HLD ?CKD Stage IV ?Punctuate pericallosal white matter infarct ?Anemia of chronic disease ?Foraminal stenoses ?Significant Hospital Events: ?Including procedures, antibiotic start and stop dates in addition to other pertinent events   ?01/14/22-01/15/22: Admit to ICU post emergent nasotracheal intubation due to ACE inhibitor induced angioedema ?4/24 remains intubated, swelling slightly;y improved ? ? ?Interim History / Subjective:  ?Patient intubated and sedated with a nasotracheal airway ?Remains vent ?Remains critically ill ? ? ?Objective   ?Blood pressure (!) 146/79, pulse 68, temperature 97.8 ?F (36.6 ?C), resp. rate 14, height '6\' 1"'$  (1.854 m), weight 85 kg, SpO2 100 %. ?   ?Vent Mode: PRVC ?FiO2 (%):  [30 %] 30 % ?Set Rate:  [16 bmp] 16 bmp ?Vt Set:  [500 mL] 500 mL ?PEEP:  [5 cmH20] 5 cmH20 ?Plateau Pressure:  [18 cmH20-19 cmH20] 18 cmH20  ? ?Intake/Output Summary (Last 24 hours) at 01/16/2022 3810 ?Last data filed at 01/16/2022 1751 ?Gross per 24 hour  ?Intake 726.64 ml  ?Output 1425 ml  ?Net -698.36 ml  ? ?Filed Weights  ? 01/14/22 2232 01/15/22 0500 01/16/22 0500  ?Weight: 84.2 kg 85.7 kg 85 kg  ? ? ?REVIEW OF SYSTEMS ? ?PATIENT IS UNABLE TO PROVIDE COMPLETE REVIEW OF SYSTEMS DUE TO SEVERE CRITICAL ILLNESS AND TOXIC METABOLIC ENCEPHALOPATHY ? ? ? ?PHYSICAL EXAMINATION: ? ?GENERAL:critically ill appearing, +resp distress ?EYES: Pupils equal, round, reactive to light.  No scleral icterus.  ?MOUTH: Moist mucosal membrane. NASALLY  INTUBATED ?NECK: Supple.  ?PULMONARY: +rhonchi,  +wheezing ?CARDIOVASCULAR: S1 and S2.  No murmurs  ?GASTROINTESTINAL: Soft, nontender, -distended. Positive bowel sounds.  ?MUSCULOSKELETAL: + edema.  ?NEUROLOGIC: obtunded ?SKIN/TONGUE ?Skin: old central line site R femoral, blood blister on posterior tongue ? ? ? ? ? ?Assessment & Plan:  ?71 yo AAF with Acute Hypoxic Respiratory Failure secondary to angioedema in the setting of anaphylactic reaction to ACE inhibitor leading to inability to protect airway and severe airway compromise ? ?Severe ACUTE Hypoxic and Hypercapnic Respiratory Failure ?-continue Mechanical Ventilator support ?-Wean Fio2 and PEEP as tolerated ?-VAP/VENT bundle implementation ?- Wean PEEP & FiO2 as tolerated, maintain SpO2 > 88% ?- Head of bed elevated 30 degrees, VAP protocol in place ?- Plateau pressures less than 30 cm H20  ?- Intermittent chest x-ray & ABG PRN ?- Ensure adequate pulmonary hygiene  ?-will NOT perform SAT/SBT ?continue Fentanyl drip & Propofol drip > deep sedation due to nasotracheal intubation and severe angioedema ? ?SEVERE ANGIOEDEMA ?- High dose steroids: Decadron 20 mg QD x 5 days > 10 mg QD x 5 days ?- continue Pepcid BID, benadryl 50 mg Q 4 > 50 mg Q 6 ? ? KIDNEY INJURY/Renal Failure STAGE 4 CKD ?-continue Foley Catheter-assess need ?-Avoid nephrotoxic agents ?-Follow urine output, BMP ?-Ensure adequate renal perfusion, optimize oxygenation ?-Renal dose medications ?HD as needed ? ? ?Intake/Output Summary (Last 24 hours) at 01/16/2022 0725 ?Last data filed at 01/16/2022 1610 ?Gross per 24 hour  ?Intake 726.64 ml  ?Output 1425 ml  ?Net -698.36 ml  ? ? ?Chronic HFrEF without current exacerbation ?HTN ?HLD ?- outpatient PO regimen on hold due to lack of PO access, restart when able ?- manage BP with IV PRN as needed to maintain SBP < 180 ?- continuous cardiac monitoring ?-daily weights ? ? ?NEUROLOGY ?ACUTE TOXIC METABOLIC ENCEPHALOPATHY ?CVA- recent history  ?- unable to continue plavix/ASA due to lack of PO access,  restarted when able ? ? ?ENDO ?- ICU hypoglycemic\Hyperglycemia protocol ?-check FSBS per protocol ? ? ?GI ?GI PROPHYLAXIS as indicated ? ?NUTRITIONAL STATUS ?DIET-->NPO ?Constipation protocol as indicated ? ? ?ELECTROLYTES ?-follow labs as needed ?-replace as needed ?-pharmacy consultation and following ? ? ?Best Practice (right click and "Reselect all SmartList Selections" daily)  ?Diet/type: NPO ?DVT prophylaxis: prophylactic heparin  ?GI prophylaxis: H2B ?Lines: Central line and yes and it is still needed ?Foley:  N/A ?Code Status:  full code ? ?Labs   ?CBC: ?Recent Labs  ?Lab 01/10/22 ?1316 01/13/22 ?9604 01/14/22 ?2229 01/15/22 ?0249 01/16/22 ?0350  ?WBC 8.2 9.0 10.1 6.9 8.2  ?NEUTROABS  --   --  6.9 6.1  --   ?HGB 8.1* 8.1* 9.3* 8.2* 8.5*  ?HCT 26.3* 26.3* 31.5* 27.5* 28.3*  ?MCV 86.2 86.5 91.0 90.2 90.1  ?PLT 268 241 193 209 251  ? ? ? ?Basic Metabolic Panel: ?Recent Labs  ?Lab 01/10/22 ?1316 01/12/22 ?0400 01/13/22 ?5409 01/14/22 ?2229 01/15/22 ?0200 01/16/22 ?0350  ?NA 135 137 137 140 140 137  ?K 5.1 4.3 5.1 5.2* 5.0 6.3*  ?CL 99 102 101 102 104 104  ?CO2 '27 27 26 26 27 23  '$ ?GLUCOSE 258* 75 56* 104* 93 146*  ?BUN 56* 41* 66* 44* 43* 53*  ?CREATININE 2.98* 2.31* 2.89* 2.73* 2.59* 3.45*  ?CALCIUM 8.4* 8.4* 8.3* 8.7* 8.4* 8.3*  ?MG  --   --   --   --  1.7 1.7  ?PHOS 5.0*  --   --   --  5.0* 6.6*  ? ? ?GFR: ?Estimated Creatinine Clearance: 18.1 mL/min (A) (by C-G formula based on SCr of 3.45 mg/dL (H)). ?Recent Labs  ?Lab 01/13/22 ?0737 01/14/22 ?2229 01/15/22 ?0249 01/16/22 ?0350  ?WBC 9.0 10.1 6.9 8.2  ? ? ? ?Liver Function Tests: ?Recent Labs  ?Lab 01/10/22 ?1316 01/12/22 ?0400 01/15/22 ?0200  ?AST  --  49* 76*  ?ALT  --  125* 117*  ?ALKPHOS  --  306* 344*  ?BILITOT  --  0.4 0.4  ?PROT  --  6.0* 6.8  ?ALBUMIN 2.3* 2.0* 2.4*  ? ? ?HbA1C: ?Hgb A1c MFr Bld  ?Date/Time Value Ref Range Status  ?12/27/2021 06:11 AM 5.5 4.8 - 5.6 % Final  ?  Comment:  ?  (NOTE) ?        Prediabetes: 5.7 - 6.4 ?        Diabetes:  >6.4 ?        Glycemic control for adults with diabetes: <7.0 ?  ? ? ?CBG: ?Recent Labs  ?Lab 01/15/22 ?1103 01/15/22 ?1554 01/15/22 ?1928 01/16/22 ?0010 01/16/22 ?0339  ?GLUCAP 151* 101* 111* 111* 144*  ? ? ?Allergi

## 2022-01-16 NOTE — Progress Notes (Signed)
?[facility]  ?ROUNDING NOTE  ? ?Subjective:  ? ?Deanna [name] is a 71 year old female with past medical histories including hypertension, CAD, CVA, and recently diagnosed end-stage renal disease with during hemodialysis.  Patient presents to the emergency department with facial swelling including mouth and tongue.  Patient has been admitted for Angioedema [T78.3XXA] ? ?Patient is known to our practice from previous admission and was initiated on dialysis during that admission.  Patient was assigned ?to [facility] for outpatient treatments on a MWF schedule, supervised by Dr. Holley Raring.  ? ?Update: ?Remains intubated, sedated ?Improved angioedema ?Lower extremity edema remains ? ?Objective:  ?Vital signs in last 24 hours:  ?Temp:  [97.5 ?F (36.4 ?C)-98.3 ?F (36.8 ?C)] 98 ?F (36.7 ?C) (04/24 1100) ?Pulse Rate:  [61-92] 61 (04/24 1230) ?Resp:  [14-18] 16 (04/24 1230) ?BP: (107-169)/(68-95) 127/77 (04/24 1230) ?SpO2:  [97 %-100 %] 100 % (04/24 1230) ?FiO2 (%):  [30 %] 30 % (04/24 1130) ?Weight:  [85 kg-88.3 kg] 88.3 kg (04/24 1020) ? ?Weight change: 0.767 kg ?Filed Weights  ? 01/15/22 0500 01/16/22 0500 01/16/22 1020  ?Weight: 85.7 kg 85 kg 88.3 kg  ? ? ?Intake/Output: ?I/O last 3 completed shifts: ?In: 1526 [I.V.:1476; IV Piggyback:50] ?Out: 1425 [Urine:1425] ?  ?Intake/Output this shift: ? No intake/output data recorded. ? ?Physical Exam: ?General: Sedated  ?Head: Massive tongue edema  ?Eyes: Anicteric  ?Lungs:  Ventilator  ?Heart: Regular rate and rhythm  ?Abdomen:  Soft, nontender,   ?Extremities: 1+ peripheral edema.  ?Neurologic: Nonfocal, moving all four extremities  ?Skin: No lesions  ?Access: Right PermCath  ? ? ?Basic Metabolic Panel: ?Recent Labs  ?Lab 01/10/22 ?1316 01/12/22 ?0400 01/13/22 ?2197 01/14/22 ?2229 01/15/22 ?0200 01/16/22 ?0350 01/16/22 ?0845  ?NA 135 137 137 140 140 137  --   ?K 5.1 4.3 5.1 5.2* 5.0 6.3* 6.4*  ?CL 99 102 101 102 104 104  --   ?CO2 _0 --    ?GLUCOSE 258* 75 56* 104* 93 146*  --   ?BUN 56* 41* 66* 44* 43* 53*  --   ?CREATININE 2.98* 2.31* 2.89* 2.73* 2.59* 3.45*  --   ?CALCIUM 8.4* 8.4* 8.3* 8.7* 8.4* 8.3*  --   ?MG  --   --   --   --  1.7 1.7  --   ?PHOS 5.0*  --   --   --  5.0* 6.6*  --   ? ? ? ?Liver Function Tests: ?Recent Labs  ?Lab 01/10/22 ?1316 01/12/22 ?0400 01/15/22 ?0200  ?AST  --  49* 76*  ?ALT  --  125* 117*  ?ALKPHOS  --  306* 344*  ?BILITOT  --  0.4 0.4  ?PROT  --  6.0* 6.8  ?ALBUMIN 2.3* 2.0* 2.4*  ? ? ?No results for input(s): LIPASE, AMYLASE in the last 168 hours. ?No results for input(s): AMMONIA in the last 168 hours. ? ?CBC: ?Recent Labs  ?Lab 01/10/22 ?1316 01/13/22 ?5883 01/14/22 ?2229 01/15/22 ?0249 01/16/22 ?0350  ?WBC 8.2 9.0 10.1 6.9 8.2  ?NEUTROABS  --   --  6.9 6.1  --   ?HGB 8.1* 8.1* 9.3* 8.2* 8.5*  ?HCT 26.3* 26.3* 31.5* 27.5* 28.3*  ?MCV 86.2 86.5 91.0 90.2 90.1  ?PLT 268 241 193 209 251  ? ? ? ?Cardiac Enzymes: ?No results for input(s): CKTOTAL, CKMB, CKMBINDEX, TROPONINI in the last 168 hours. ? ?BNP: ?Invalid input(s): POCBNP ? ?CBG: ?Recent Labs  ?Lab 01/15/22 ?1928 01/16/22 ?0010 01/16/22 ?  8032 01/16/22 ?0723 01/16/22 ?1100  ?GLUCAP 111* 111* 144* 126* 76  ? ? ? ?Microbiology: ?Results for orders placed or performed during the hospital encounter of 01/14/22  ?Resp Panel by RT-PCR (Flu A&B, Covid) Nasopharyngeal Swab     Status: None  ? Collection Time: 01/15/22 10:49 AM  ? Specimen: Nasopharyngeal Swab; Nasopharyngeal(NP) swabs in vial transport medium  ?Result Value Ref Range Status  ? SARS Coronavirus 2 by RT PCR NEGATIVE NEGATIVE Final  ?  Comment: (NOTE) ?SARS-CoV-2 target nucleic acids are NOT DETECTED. ? ?The SARS-CoV-2 RNA is generally detectable in upper respiratory ?specimens during the acute phase of infection. The lowest ?concentration of SARS-CoV-2 viral copies this assay can detect is ?138 copies/mL. A negative result does not preclude SARS-Cov-2 ?infection and should not be used as the sole basis for  treatment or ?other patient management decisions. A negative result may occur with  ?improper specimen collection/handling, submission of specimen other ?than nasopharyngeal swab, presence of viral mutation(s) within the ?areas targeted by this assay, and inadequate number of viral ?copies(<138 copies/mL). A negative result must be combined with ?clinical observations, patient history, and epidemiological ?information. The expected result is Negative. ? ?Fact Sheet for Patients:  ?EntrepreneurPulse.com.au ? ?Fact Sheet for Healthcare Providers:  ?IncredibleEmployment.be ? ?This test is no t yet approved or cleared by the [place] FDA and  ?has been authorized for detection and/or diagnosis of SARS-CoV-2 by ?FDA under an Emergency Use Authorization (EUA). This EUA will remain  ?in effect (meaning this test can be used) for the duration of the ?COVID-19 declaration under Section 564(b)(1) of the Act, 21 ?U.S.C.section 360bbb-3(b)(1), unless the authorization is terminated  ?or revoked sooner.  ? ? ?  ? Influenza A by PCR NEGATIVE NEGATIVE Final  ? Influenza B by PCR NEGATIVE NEGATIVE Final  ?  Comment: (NOTE) ?The Xpert Xpress SARS-CoV-2/FLU/RSV plus assay is intended as an aid ?in the diagnosis of influenza from Nasopharyngeal swab specimens and ?should not be used as a sole basis for treatment. Nasal washings and ?aspirates are unacceptable for Xpert Xpress SARS-CoV-2/FLU/RSV ?testing. ? ?Fact Sheet for Patients: ?EntrepreneurPulse.com.au ? ?Fact Sheet for Healthcare Providers: ?IncredibleEmployment.be ? ?This test is not yet approved or cleared by the [place] FDA and ?has been authorized for detection and/or diagnosis of SARS-CoV-2 by ?FDA under an Emergency Use Authorization (EUA). This EUA will remain ?in effect (meaning this test can be used) for the duration of the ?COVID-19 declaration under Section 564(b)(1) of the Act, 21  U.S.C. ?section 360bbb-3(b)(1), unless the authorization is terminated or ?revoked. ? ?Performed at [facility], [place], ?[place] [address] ?  ? ? ?Coagulation Studies: ?No results for input(s): LABPROT, INR in the last 72 hours. ? ?Urinalysis: ?No results for input(s): COLORURINE, LABSPEC, [place], GLUCOSEU, HGBUR, BILIRUBINUR, KETONESUR, PROTEINUR, UROBILINOGEN, NITRITE, LEUKOCYTESUR in the last 72 hours. ? ?Invalid input(s): APPERANCEUR  ? ? ?Imaging: ?DG Chest Port 1 View ? ?Result Date: 01/15/2022 ?CLINICAL DATA:  Nasal intubation EXAM: PORTABLE CHEST 1 VIEW COMPARISON:  None. FINDINGS: Endotracheal tube tip is 3.6 cm above the carina. Cardiomegaly, vascular congestion. Bibasilar atelectasis or infiltrates. Right dialysis catheter remains in place, unchanged. IMPRESSION: Endotracheal tube tip 3.6 cm above the carina. Cardiomegaly with vascular congestion and bibasilar atelectasis or infiltrates. Electronically Signed   By: Rolm Baptise M.D.   On: 01/15/2022 00:42   ? ? ?Medications:  ? ? sodium chloride    ? sodium chloride    ? dextrose 25  mL/hr at 01/16/22 1116  ? famotidine (PEPCID) IV 20 mg (01/16/22 0840)  ? fentaNYL infusion INTRAVENOUS 50 mcg/hr (01/16/22 0337)  ? propofol (DIPRIVAN) infusion 35 mcg/kg/min (01/16/22 0337)  ? ? chlorhexidine gluconate (MEDLINE KIT)  15 mL Mouth Rinse BID  ? Chlorhexidine Gluconate Cloth  6 each Topical Daily  ? dexamethasone (DECADRON) injection  20 mg Intravenous Q24H  ? Followed by  ? [START ON 01/20/2022] dexamethasone (DECADRON) injection  10 mg Intravenous Q24H  ? diphenhydrAMINE  25 mg Intravenous Q6H  ? docusate  100 mg Per Tube BID  ? fentaNYL (SUBLIMAZE) injection  25 mcg Intravenous Once  ? heparin  5,000 Units Subcutaneous Q8H  ? heparin sodium (porcine)      ? insulin aspart  0-20 Units Subcutaneous Q4H  ? insulin glargine-yfgn  20 Units Subcutaneous QHS  ? mouth rinse  15 mL Mouth Rinse 10 times per day  ? polyethylene glycol  17 g  Per Tube Daily  ? ?sodium chloride, sodium chloride, alteplase, docusate sodium, fentaNYL, heparin, lidocaine (PF), lidocaine-prilocaine, midazolam, midazolam, pentafluoroprop-tetrafluoroeth, polyethylene glycol

## 2022-01-16 NOTE — Progress Notes (Signed)
Son, Alinna Siple would be contacted about signing paperwork for POA for his mother.  ?His contact number: [phone] ?

## 2022-01-16 NOTE — Consult Note (Signed)
PHARMACY CONSULT NOTE - FOLLOW UP ? ?Pharmacy Consult for Electrolyte Monitoring and Replacement  ? ?Recent Labs: ?Potassium (mmol/L)  ?Date Value  ?01/16/2022 6.3 (HH)  ? ?Magnesium (mg/dL)  ?Date Value  ?01/16/2022 1.7  ? ?Calcium (mg/dL)  ?Date Value  ?01/16/2022 8.3 (L)  ? ?Albumin (g/dL)  ?Date Value  ?01/15/2022 2.4 (L)  ? ?Phosphorus (mg/dL)  ?Date Value  ?01/16/2022 6.6 (H)  ? ?Sodium (mmol/L)  ?Date Value  ?01/16/2022 137  ? ?4/24: Corrected calcium 9.6 mg/dL ? ?Assessment: ?71 yo F presenting to [facility] ED on 01/14/22 from home via EMS with complaints of angioedema due to ACEI.  ? ?Potassium is high, increased from 5.0 to 6.3 mmol/dL today 4/24 0350.  ?After one dose of Insulin aspart 10 units IV and dextrose 50% solution 50 mL at 4/24 0530 ordered by medical team, potassium is still high at 6.4 mmol/dL at 04/24 0845. ? ?Phosphorus is high, increased from 5.0 to 6.6 mg/dL today. ? ?Goal of Therapy:  ?WNL ? ?Plan:  ?Pt is getting hemodialysis this morning. ? ?F/u with potassium after dialysis. ?F/u with AM labs for other labs. ? ?Rebbeca Paul, PharmD Candidate [place] 2025 ?01/16/2022 8:37 AM  ?

## 2022-01-16 NOTE — Progress Notes (Signed)
During rounds Dr. Mortimer Fries notified of patients repeat potassium level of 6.4. [place] nurse in room to start hemodialysis. Orders to recheck potassium this evening. No additional orders at this time. Continue to monitor.  ?

## 2022-01-16 NOTE — Progress Notes (Signed)
Initial Nutrition Assessment ? ?DOCUMENTATION CODES:  ? ?Not applicable ? ?INTERVENTION:  ? ?If tube feeds initiated, recommend: ? ?Vital 1.2'@55ml'$ /hr- Initiate at 70m/hr and increase by 166mhr q 8 hours until goal rate is reached. ? ?Pro-Source 4531mID via tube, provides 40kcal and 11g of protein per serving  ? ?Free water flushes 31m70m hours to maintain tube patency  ? ?Regimen provides 1704kcal/day, 132g/day protein and 1250ml42m of free water  ? ?NUTRITION DIAGNOSIS:  ? ?Inadequate oral intake related to inability to eat (pt sedated and ventilated) as evidenced by NPO status. ? ?GOAL:  ? ?Provide needs based on ASPEN/SCCM guidelines ? ?MONITOR:  ? ?Vent status, Labs, Weight trends, Skin, I & O's ? ?REASON FOR ASSESSMENT:  ? ?Ventilator ?  ? ?ASSESSMENT:  ? ?71 y/37female with h/o HTN, CAD, CVA, ESRD on HD, DM, CVA and CHF who is admitted with angioedema. ? ?Pt sedated and ventilated and required nasal intubation. Pt does not have any enteral access r/t edema. Edema is improved today. Plan is for possible extubation tomorrow; will reassess nutritional plan tomorrow if pt does not extubate. Per chart, pt appears weight stable at baseline.  ? ?Medications reviewed and include: dexamethasone, colace, epoetin, heparin, insulin, miralax, pepcid, 5% dextrose '@75ml'$ /hr, propofol  ? ?Labs reviewed: K 6.4(H), BUN 53(H), creat 3.45(H), P 6.6(H), Mg 1.7 wnl ?Hgb 8.5(L), Hct 28.3(L) ?Cbgs- 71, 76, 126, 144, 111 x 24 hrs ? ?Patient is currently intubated on ventilator support ?MV: 7.7 L/min ?Temp (24hrs), Avg:97.9 ?F (36.6 ?C), Min:97.5 ?F (36.4 ?C), Max:98.3 ?F (36.8 ?C) ? ?Propofol: 12.6 ml/hr- provides 333kcal/day  ? ?MAP- >65mmH73m ?UOP- 1425ml  71mUTRITION - FOCUSED PHYSICAL EXAM: ? ?Flowsheet Row Most Recent Value  ?Orbital Region No depletion  ?Upper Arm Region No depletion  ?Thoracic and Lumbar Region No depletion  ?Buccal Region No depletion  ?Temple Region Moderate depletion  ?Clavicle Bone Region No depletion   ?Clavicle and Acromion Bone Region No depletion  ?Scapular Bone Region No depletion  ?Dorsal Hand No depletion  ?Patellar Region No depletion  ?Anterior Thigh Region No depletion  ?Posterior Calf Region No depletion  ?Edema (RD Assessment) Mild  ?Hair Reviewed  ?Eyes Reviewed  ?Mouth Reviewed  ?Skin Reviewed  ?Nails Reviewed  ? ?Diet Order:   ?Diet Order   ? ?       ?  Diet NPO time specified  Diet effective now       ?  ? ?  ?  ? ?  ? ?EDUCATION NEEDS:  ? ?No education needs have been identified at this time ? ?Skin:  Skin Assessment: Reviewed RN Assessment ? ?Last BM:  4/21 ? ?Height:  ? ?Ht Readings from Last 1 Encounters:  ?01/14/22 '6\' 1"'$  (1.854 m)  ? ? ?Weight:  ? ?Wt Readings from Last 1 Encounters:  ?01/16/22 88.3 kg  ? ? ?Ideal Body Weight:  83.6 kg ? ?BMI:  Body mass index is 25.68 kg/m?. ? ?Estimated Nutritional Needs:  ? ?Kcal:  1624kcal/day ? ?Protein:  120-140g/day ? ?Fluid:  1.9-2.2L/day ? ?Deanna [name] CKoleen Distance, LDN ?Please refer to AMION for RD and/or RD on-call/weekend/after hours pager ? ?

## 2022-01-17 ENCOUNTER — Encounter: Payer: Self-pay | Admitting: Unknown Physician Specialty

## 2022-01-17 ENCOUNTER — Inpatient Hospital Stay: Payer: Medicare HMO

## 2022-01-17 DIAGNOSIS — T783XXA Angioneurotic edema, initial encounter: Secondary | ICD-10-CM | POA: Diagnosis not present

## 2022-01-17 DIAGNOSIS — J9601 Acute respiratory failure with hypoxia: Secondary | ICD-10-CM | POA: Diagnosis not present

## 2022-01-17 LAB — HEPATITIS B SURFACE ANTIGEN

## 2022-01-17 LAB — GLUCOSE, CAPILLARY
Glucose-Capillary: 118 mg/dL — ABNORMAL HIGH (ref 70–99)
Glucose-Capillary: 175 mg/dL — ABNORMAL HIGH (ref 70–99)
Glucose-Capillary: 125 mg/dL — ABNORMAL HIGH (ref 70–99)
Glucose-Capillary: 65 mg/dL — ABNORMAL LOW (ref 70–99)

## 2022-01-17 LAB — RENAL FUNCTION PANEL
Albumin: 2.4 g/dL — ABNORMAL LOW (ref 3.5–5.0)
Anion gap: 9 (ref 5–15)
BUN: 36 mg/dL — ABNORMAL HIGH (ref 8–23)
CO2: 26 mmol/L (ref 22–32)
Calcium: 7.9 mg/dL — ABNORMAL LOW (ref 8.9–10.3)
Chloride: 96 mmol/L — ABNORMAL LOW (ref 98–111)
Creatinine, Ser: 2.7 mg/dL — ABNORMAL HIGH (ref 0.44–1.00)
GFR, Estimated: 18 mL/min — ABNORMAL LOW (ref >60)
Glucose, Bld: 166 mg/dL — ABNORMAL HIGH (ref 70–99)
Phosphorus: 5.2 mg/dL — ABNORMAL HIGH (ref 2.5–4.6)
Potassium: 4.2 mmol/L (ref 3.5–5.1)
Sodium: 131 mmol/L — ABNORMAL LOW (ref 135–145)

## 2022-01-17 LAB — HEPATITIS B SURFACE ANTIBODY,QUALITATIVE

## 2022-01-17 LAB — CBC
HCT: 28.7 % — ABNORMAL LOW (ref 36.0–46.0)
Hemoglobin: 8.9 g/dL — ABNORMAL LOW (ref 12.0–15.0)
MCH: 27.2 pg (ref 26.0–34.0)
MCHC: 31 g/dL (ref 30.0–36.0)
MCV: 87.8 fL (ref 80.0–100.0)
Platelets: 232 10*3/uL (ref 150–400)
RBC: 3.27 MIL/uL — ABNORMAL LOW (ref 3.87–5.11)
RDW: 15.4 % (ref 11.5–15.5)
WBC: 9.1 10*3/uL (ref 4.0–10.5)
nRBC: 0.2 % (ref 0.0–0.2)

## 2022-01-17 LAB — MAGNESIUM
Magnesium: 1.6 mg/dL — ABNORMAL LOW (ref 1.7–2.4)
Magnesium: 2 mg/dL (ref 1.7–2.4)

## 2022-01-17 MED ORDER — ORAL CARE MOUTH RINSE: 15.0000 mL | Freq: Two times a day (BID) | OROMUCOSAL | Status: DC

## 2022-01-17 MED ORDER — DEXTROSE-NACL 5-0.9 % IV SOLN
INTRAVENOUS | Status: DC
Start: 2022-01-17 — End: 2022-01-18

## 2022-01-17 MED ORDER — CHLORHEXIDINE GLUCONATE 0.12 % MT SOLN
15.0000 mL | Freq: Two times a day (BID) | OROMUCOSAL | Status: DC
  Administered 2022-01-17 – 2022-01-18 (×2): 15 mL via OROMUCOSAL
  Filled 2022-01-17 (×3): qty 15

## 2022-01-17 MED ORDER — DEXTROSE 50 % IV SOLN
12.5000 g | INTRAVENOUS | Status: AC
  Administered 2022-01-17: 12.5 g via INTRAVENOUS
  Filled 2022-01-17: qty 50

## 2022-01-17 MED ORDER — SIMETHICONE 40 MG/0.6ML PO SUSP
40.0000 mg | Freq: Four times a day (QID) | ORAL | Status: DC | PRN
  Filled 2022-01-17: qty 0.6

## 2022-01-17 MED ORDER — MAGNESIUM SULFATE 2 GM/50ML IV SOLN
2.0000 g | Freq: Once | INTRAVENOUS | Status: AC
  Administered 2022-01-17: 2 g via INTRAVENOUS
  Filled 2022-01-17: qty 50

## 2022-01-17 NOTE — Progress Notes (Signed)
? ?NAMEEvey [name], MRN:  [id number], DOB:  [birth year], LOS: 3 ?ADMISSION DATE:  01/14/2022 ? ? ?History of Present Illness:  ? ?History of Present Illness:  ?71 yo F presenting to [facility] ED on 01/14/22 from home via EMS with complaints of angioedema. Per the patient's granddaughter, [name], the patient has been taking lisinopril for many years and after taking this medication tonight along with her other night time medications immediately felt swelling of her tongue and in her mouth. They attempted to treat at home with benadryl, when that didn't work EMS was called. EMS administered IM epinephrine. ?ED course: ?Upon arrival to ED patient noted to have significant tongue swelling, so much that her teeth started to cut into her tongue. Dr. Tami Ribas from ENT was emergently consulted. Patient was taken to the OR for nasotracheal intubation on mechanical ventilatory support.  ?Medications given: Benadryl 25 mg, Pepcid 20 mg, solumedrol 125 mg, afrin spray, tranexamic acid ?Initial Vitals: tachypneic 40, tachycardic 104, hypertensive 165/99 & SpO2 99% on RA ?Significant labs: (Labs/ Imaging personally reviewed) ?I, Domingo Pulse [place], AGACNP-BC, personally viewed and interpreted this ECG. ?EKG Interpretation: Date: 01/14/22, EKG Time: 22:28, Rate: 109, Rhythm: ST, QRS Axis: borderline LAD, Intervals: borderline prolonged QTc, ST/T Wave abnormalities: mild inferolateral ST depression (similar to 12/26/21), Narrative Interpretation: ST ?Chemistry: Na+: 140, K+: 5.2, BUN/Cr.: 44/2.73, Serum CO2/ AG: 26/12 ?Hematology: WBC: 10, Hgb: 9.3,  ?COVID-19 & Influenza A/B: pending ?ABG: pending ?CXR : pending ?  ?PCCM consulted for admission due to angioedema requiring emergent nasotracheal intubation and mechanical ventilatory support. ?  ?Of note patient was recently hospitalized from 12/26/21-01/13/22 due to complaints of right sided weakness. During that admission she was treated for a LTRI from parainfluenza, a small CVA  was discovered as well as Foraminal stenoses- neither requiring intervention inpatient. Patient also had AKI on CKD requiring iHD during admission. She was followed by nephrology, cardiology, neurology, neurosurgery, PCCM & TRH. Discharged home to her granddaughter on 01/13/22. ?Pertinent  Medical History  ?HFrEF (LVEF 30%, LV global hypokinesis, G1DD) ?CAD ?T2DM ?HTN ?HLD ?CKD Stage IV ?Punctuate pericallosal white matter infarct ?Anemia of chronic disease ?Foraminal stenoses ?Significant Hospital Events: ?Including procedures, antibiotic start and stop dates in addition to other pertinent events   ?01/14/22-01/15/22: Admit to ICU post emergent nasotracheal intubation due to ACE inhibitor induced angioedema ?4/24 remains intubated, swelling slightly;y improved ?4/25 remains on vent. Plan for trial of extubation ? ? ? ? ?Antimicrobials:  ? ?Antibiotics Given (last 72 hours)   ? ? None  ? ?  ? ? ? ? ? ? ? ?Interim History / Subjective:  ?Remains intubated ?Plan for trail of extubation ?Hypoglycemia resolving ?NA is low will stop D5 ? ? ?  Latest Ref Rng & Units 01/17/2022  ?  5:45 AM 01/16/2022  ?  5:25 PM 01/16/2022  ?  8:45 AM  ?BMP  ?Glucose 70 - 99 mg/dL 166      ?BUN 8 - 23 mg/dL 36      ?Creatinine 0.44 - 1.00 mg/dL 2.70      ?Sodium 135 - 145 mmol/L 131      ?Potassium 3.5 - 5.1 mmol/L 4.2   4.0   6.4    ?Chloride 98 - 111 mmol/L 96      ?CO2 22 - 32 mmol/L 26      ?Calcium 8.9 - 10.3 mg/dL 7.9      ? ? ?CBC ?   ?Component Value Date/Time  ? WBC 9.1  01/17/2022 0545  ? RBC 3.27 (L) 01/17/2022 0545  ? HGB 8.9 (L) 01/17/2022 0545  ? HCT 28.7 (L) 01/17/2022 0545  ? PLT 232 01/17/2022 0545  ? MCV 87.8 01/17/2022 0545  ? MCH 27.2 01/17/2022 0545  ? MCHC 31.0 01/17/2022 0545  ? RDW 15.4 01/17/2022 0545  ? LYMPHSABS 0.5 (L) 01/15/2022 0249  ? MONOABS 0.2 01/15/2022 0249  ? EOSABS 0.0 01/15/2022 0249  ? BASOSABS 0.0 01/15/2022 0249  ? ? ? ? ? ? ?Objective   ?Blood pressure 138/84, pulse 67, temperature 97.6 ?F (36.4 ?C),  temperature source Axillary, resp. rate 17, height 6' 1" (1.854 m), weight 87.3 kg, SpO2 100 %. ?   ?Vent Mode: PRVC ?FiO2 (%):  [30 %] 30 % ?Set Rate:  [16 bmp] 16 bmp ?Vt Set:  [500 mL] 500 mL ?PEEP:  [5 cmH20] 5 cmH20 ?Plateau Pressure:  [19 cmH20-20 cmH20] 19 cmH20  ? ?Intake/Output Summary (Last 24 hours) at 01/17/2022 0716 ?Last data filed at 01/17/2022 0600 ?Gross per 24 hour  ?Intake 902.17 ml  ?Output 2251 ml  ?Net -1348.83 ml  ? ?Filed Weights  ? 01/16/22 1020 01/16/22 1421 01/17/22 0500  ?Weight: 88.3 kg 86.6 kg 87.3 kg  ? ? ? ?REVIEW OF SYSTEMS ? ?PATIENT IS UNABLE TO PROVIDE COMPLETE REVIEW OF SYSTEMS DUE TO SEVERE CRITICAL ILLNESS AND TOXIC METABOLIC ENCEPHALOPATHY ? ? ? ?PHYSICAL EXAMINATION: ? ?GENERAL:critically ill appearing, +resp distress ?EYES: Pupils equal, round, reactive to light.  No scleral icterus.  ?MOUTH: Moist mucosal membrane. INTUBATED ?Tongue swelling much improved ?NECK: Supple.  ?PULMONARY: +rhonchi, +wheezing ?CARDIOVASCULAR: S1 and S2.  No murmurs  ?GASTROINTESTINAL: Soft, nontender, -distended. Positive bowel sounds.  ?MUSCULOSKELETAL: No swelling, clubbing, or edema.  ?NEUROLOGIC: obtunded ?SKIN:intact,warm,dry ? ? ?  ?  ? ? ?Labs/imaging that I havepersonally reviewed  ?(right click and "Reselect all SmartList Selections" daily)  ? ? ? ? ?ASSESSMENT AND PLAN ?SYNOPSIS ? ?71 yo AAF with Acute Hypoxic Respiratory Failure secondary to angioedema in the setting of anaphylactic reaction to ACE inhibitor leading to inability to protect airway and severe airway compromise ?  ? ? ?Severe ACUTE Hypoxic and Hypercapnic Respiratory Failure ?-continue Mechanical Ventilator support ?-continue Bronchodilator Therapy ?-Wean Fio2 and PEEP as tolerated ?-VAP/VENT bundle implementation ?-will perform SAT/SBT when respiratory parameters are met ?continue Fentanyl drip & Propofol drip > deep sedation due to nasotracheal intubation and severe angioedema-resolving ?Check CUFF leak assess for trial of  extubation ? ?Vent Mode: PRVC ?FiO2 (%):  [30 %] 30 % ?Set Rate:  [16 bmp] 16 bmp ?Vt Set:  [500 mL] 500 mL ?PEEP:  [5 cmH20] 5 cmH20 ?Plateau Pressure:  [19 cmH20-20 cmH20] 19 cmH20 ? ?SEVERE ANGIOEDEMA ?- High dose steroids: Decadron 20 mg QD x 5 days > 10 mg QD x 5 days ?- continue Pepcid BID, benadryl 50 mg Q 4 > 50 mg Q 6 ?  ?CARDIAC ?ICU monitoring ? ? ?ACUTE KIDNEY INJURY/Renal Failure ?-continue Foley Catheter-assess need ?-Avoid nephrotoxic agents ?-Follow urine output, BMP ?-Ensure adequate renal perfusion, optimize oxygenation ?-Renal dose medications ?HD per nephrology ? ?Intake/Output Summary (Last 24 hours) at 01/17/2022 0716 ?Last data filed at 01/17/2022 0600 ?Gross per 24 hour  ?Intake 902.17 ml  ?Output 2251 ml  ?Net -1348.83 ml  ? ? ? ?NEUROLOGY ?need for sedation ?Goal RASS -2 to -3 ? ? ?ENDO ?- ICU hypoglycemic\Hyperglycemia protocol ?-check FSBS per protocol ? ? ?GI ?GI PROPHYLAXIS as indicated ? ?NUTRITIONAL STATUS ?DIET--NPO ?Constipation protocol as indicated ? ? ?  ELECTROLYTES ?-follow labs as needed ?-replace as needed ?-pharmacy consultation and following ? ? ? ?Best Practice (right click and "Reselect all SmartList Selections" daily)  ?Diet/type: NPO ?DVT prophylaxis: prophylactic heparin  ?GI prophylaxis: H2B ?Lines: Central line and yes and it is still needed ?Foley:  N/A ?Code Status:  full code ?  ? ?Labs   ?CBC: ?Recent Labs  ?Lab 01/13/22 ?7619 01/14/22 ?2229 01/15/22 ?0249 01/16/22 ?0350 01/17/22 ?5093  ?WBC 9.0 10.1 6.9 8.2 9.1  ?NEUTROABS  --  6.9 6.1  --   --   ?HGB 8.1* 9.3* 8.2* 8.5* 8.9*  ?HCT 26.3* 31.5* 27.5* 28.3* 28.7*  ?MCV 86.5 91.0 90.2 90.1 87.8  ?PLT 241 193 209 251 232  ? ? ?Basic Metabolic Panel: ?Recent Labs  ?Lab 01/10/22 ?1316 01/12/22 ?0400 01/13/22 ?2671 01/14/22 ?2229 01/15/22 ?0200 01/16/22 ?0350 01/16/22 ?0845 01/16/22 ?1725 01/17/22 ?2458  ?NA 135   < > 137 140 140 137  --   --  131*  ?K 5.1   < > 5.1 5.2* 5.0 6.3* 6.4* 4.0 4.2  ?CL 99   < > 101 102 104 104   --   --  96*  ?CO2 27   < > _0 --   --  26  ?GLUCOSE 258*   < > 56* 104* 93 146*  --   --  166*  ?BUN 56*   < > 66* 44* 43* 53*  --   --  36*  ?CREATININE 2.98*   < > 2.89* 2.73* 2.59* 3.45*

## 2022-01-17 NOTE — Progress Notes (Signed)
Attempted to check pt's blood sugar. Patient refused stating: "I don't want you putting all those holes in meMortimer Fries, MD notified. ?

## 2022-01-17 NOTE — Evaluation (Signed)
Clinical/Bedside Swallow Evaluation ?Patient Details  ?Name: Deanna [name] ?MRN: [id number] ?Date of Birth: 02/14/1951 ? ?Today's Date: 01/17/2022 ?Time: SLP Start Time (ACUTE ONLY): 1761 SLP Stop Time (ACUTE ONLY): 6073 ?SLP Time Calculation (min) (ACUTE ONLY): 60 min ? ?Past Medical History:  ?Past Medical History:  ?Diagnosis Date  ? Coronary artery disease   ? Hypertension   ? ?Past Surgical History:  ?Past Surgical History:  ?Procedure Laterality Date  ? CARDIAC CATHETERIZATION Left 03/01/2016  ? Procedure: Left Heart Cath and Coronary Angiography;  Surgeon: Teodoro Spray, MD;  Location: [place] [facility];  Service: Cardiovascular;  Laterality: Left;  ? DIALYSIS/PERMA CATHETER INSERTION Right 01/10/2022  ? Procedure: DIALYSIS/PERMA CATHETER INSERTION;  Surgeon: Evaristo Bury, MD;  Location: [place] [facility];  Service: Cardiovascular;  Laterality: Right;  ? INTUBATION-ENDOTRACHEAL WITH TRACHEOSTOMY STANDBY Left 01/14/2022  ? Procedure: INTUBATION-NASAL WITH TRACHEOSTOMY STANDBY;  Surgeon: Beverly Gust, MD;  Location: [facility];  Service: ENT;  Laterality: Left;  ? TEMPORARY DIALYSIS CATHETER N/A 01/04/2022  ? Procedure: TEMPORARY DIALYSIS CATHETER;  Surgeon: Algernon Huxley, MD;  Location: [place] [facility];  Service: Cardiovascular;  Laterality: N/A;  ? ?HPI:  ?Pt is a 71 yo F presenting to [facility] ED on 01/14/22 from home via EMS with complaints of angioedema. Per the patient's granddaughter, Deanna [name], the patient took Lisinopril post d/c home (recent admit for R sided weakness; dialysis) for many years and after taking this medication tonight along with her other night time medications, she immediately felt swelling of her tongue and in her mouth/face. They attempted to treat at home with benadryl, when that didn't work EMS was called. EMS administered IM epinephrine.  ED course:  Upon arrival to ED patient noted to have significant tongue swelling, so much that her Denture/teeth started to cut into her  tongue. Dr. Tami Ribas from ENT was emergently consulted. Patient was taken to the OR for nasotracheal intubation on mechanical ventilatory support.  pt was extubated on 01/17/2022, ~ 3 days post intubation.    ?CXR today: Similar cardiomegaly with vascular congestion, with low lung  volumes and bibasilar atelectasis versus infiltrates.  3. Stable blunting of the bilateral costophrenic angles may reflect  small pleural effusions.   ?Recent MRI this month(last admit): Punctate focus of reduced diffusion within the superior right  pericallosal white matter, likely a small acute/subacute infarct. No  hemorrhage or mass effect.  2. Moderate chronic small vessel ischemic changes and generalized  volume loss.  ? ?  ?Assessment / Plan / Recommendation  ?Clinical Impression ? Pt seen for BSE. Pt was adamant she needed to "eat something" b/f she would participate in [place] cares at bedside. Post consultation w/ NSG, BSE was completed w/ po trials given.  ?OF NOTE: pt was recently extubated this morning ~5 hours earlier s/p nasopharyngeal intubation post Angioedema from medication. Pt's speech was adequate, min low volume. Intelligible w/ good articulation when pt put effort in her responses. She was frustrated w/ wanting to "eat something". Also, pt is Edentulous which can impact articulation precision of speech. ? ?W/ po trials at BSE, pt appears to present w/ grossly adequate oropharyngeal phase swallow function when following general aspiration precautions and w/ Supervision during oral intake.; reduced risk for aspiration. Pt was only assessed w/ liquids and puree consistencies foods; NO solids foods were trialed at this assessment. This can be completed in ongoing sessions post further healing of angioedema; general recovery overall.  ? ?Pt was able to consume po trials  of thin liquids following general aspiration precautions w/ no overt, clinical s/s of aspiration during po trials; mild coughing noted x1 when pt took Multiple  swallows of thin liquids. W/ Supervision and aspiration precautions (moved to single sips w/ more controlled volume intake), no further coughing occurred, and pt appears at reduced risk for aspiration.  ?During po trials of monitored Single sips and bites, pt consumed trials w/ no overt coughing, decline in vocal quality, or change in respiratory presentation during/post trials. O2 sats remained 100%. Oral phase appeared [facility] w/ timely bolus management and control of bolus propulsion for A-P transfer for swallowing. Oral clearing achieved w/ all trial consistencies given min extra time initially w/ purees(slight oral residue). OM Exam appeared [facility] w/ no unilateral weakness nor ROM weakness noted; HOWEVER, lingual strength appeared min decreased overall, and pt indicated tongue sensitivity(angioedema effects?). Speech Clear. Pt helped to hold cup when drinking but did not attempt to feed self.  ? ?Recommend a trial diet of Pureed(dysphagia level 1) w/ moistened foods; Thin liquids. Recommend general aspiration precautions, full Supervision and support w/ feeding at meals. SINGLE SIPS. Pills WHOLE vs Crushed in Puree for safer, easier swallowing. Education given on Pills in Puree; food consistencies and easy to eat options; general aspiration precautions; SINGLE SIPS. ST services will continue ongoing assessment in hopes to upgrade diet consistency to soft foods. MD updated; agreed. NSG agreed. ?SLP Visit Diagnosis: Dysphagia, oropharyngeal phase (R13.12) (in setting of recent angioedema) ?   ?Aspiration Risk ? Mild aspiration risk;Risk for inadequate nutrition/hydration  ?  ?Diet Recommendation   Pureed(dysphagia level 1) w/ moistened foods; Thin liquids. Recommend general aspiration precautions, full Supervision and support w/ feeding at meals. SINGLE SIPS.  ? ?Medication Administration: Whole meds with puree (vs need to Crush with puree)  ?  ?Other  Recommendations Recommended Consults:  (Dietician f/u) ?Oral Care  Recommendations: Oral care BID;Oral care before and after PO;Staff/trained caregiver to provide oral care (does not have dentures present) ?Other Recommendations:  (n/a)   ? ?Recommendations for follow up therapy are one component of a multi-disciplinary discharge planning process, led by the attending physician.  Recommendations may be updated based on patient status, additional functional criteria and insurance authorization. ? ?Follow up Recommendations  (TBD)  ? ? ?  ?Assistance Recommended at Discharge Frequent or constant Supervision/Assistance (she does not indicated she cannot feed self yet)  ?Functional Status Assessment Patient has had a recent decline in their functional status and demonstrates the ability to make significant improvements in function in a reasonable and predictable amount of time.  ?Frequency and Duration min 2x/week  ?1 week ?  ?   ? ?Prognosis Prognosis for Safe Diet Advancement: Fair (-Good) ?Barriers to Reach Goals: Cognitive deficits;Time post onset;Severity of deficits;Behavior ?Barriers/Prognosis Comment: Edentulous status currently  ? ?  ? ?Swallow Study   ?General Date of Onset: 01/14/22 ?HPI: Pt is a 71 yo F presenting to [facility] ED on 01/14/22 from home via EMS with complaints of angioedema. Per the patient's granddaughter, Deanna [name], the patient took Lisinopril post d/c home (recent admit for R sided weakness; dialysis) for many years and after taking this medication tonight along with her other night time medications, she immediately felt swelling of her tongue and in her mouth/face. They attempted to treat at home with benadryl, when that didn't work EMS was called. EMS administered IM epinephrine.  ED course:  Upon arrival to ED patient noted to have significant tongue swelling, so much that her Denture/teeth started  to cut into her tongue. Dr. Tami Ribas from ENT was emergently consulted. Patient was taken to the OR for nasotracheal intubation on mechanical ventilatory support.   pt was extubated on 01/17/2022, ~ 3 days post intubation.   CXR today: Similar cardiomegaly with vascular congestion, with low lung  volumes and bibasilar atelectasis versus infiltrates.  3. Stable blunt

## 2022-01-17 NOTE — Progress Notes (Signed)
SLP eval pending post extubation. Tongue still edematous and speech a little garbled. RN and NT at bedside to give pt bath and pt refused bath stating "If you"re not going to feed me don't touch me". Pt educated on risk for aspiration but continues to refuse care and request food.  ?

## 2022-01-17 NOTE — Progress Notes (Signed)
Nutrition Follow Up Note  ? ?DOCUMENTATION CODES:  ? ?Not applicable ? ?INTERVENTION:  ? ?RD will add supplements once diet is advanced  ? ?Pt at high refeed risk; recommend monitor potassium, magnesium and phosphorus labs daily until stable ? ?NUTRITION DIAGNOSIS:  ? ?Inadequate oral intake related to inability to eat (pt sedated and ventilated) as evidenced by NPO status. ? ?GOAL:  ? ?Patient will meet greater than or equal to 90% of their needs ? ?MONITOR:  ? ?Diet advancement, Labs, Weight trends, Skin, I & O's ? ?ASSESSMENT:  ? ?71 y/o female with h/o HTN, CAD, CVA, ESRD on HD, DM, CVA and CHF who is admitted with angioedema. ? ?Pt extubated today. SLP evaluation pending. RD will add supplements once pt's diet is advanced. Pt is likely at refeed risk. Per chart, pt is up ~7lbs since admission; pt -1.4L on her I & Os.  ? ?Medications reviewed and include: dexamethasone, colace, epoetin, heparin, insulin, miralax, pepcid ? ?Labs reviewed: Na 131(L), K 4.2 wnl, BUN 36(H), creat 2.70(H), P 5.2(H), Mg 1.6(L) ?Folate 5.4(L)- 4/9 ?Hgb 8.9(L), Hct 28.7(L) ?Cbgs- 125, 175, 118 x 24 hrs ? ?Diet Order:   ?Diet Order   ? ?       ?  Diet NPO time specified  Diet effective now       ?  ? ?  ?  ? ?  ? ?EDUCATION NEEDS:  ? ?No education needs have been identified at this time ? ?Skin:  Skin Assessment: Reviewed RN Assessment ? ?Last BM:  4/21 ? ?Height:  ? ?Ht Readings from Last 1 Encounters:  ?01/14/22 '6\' 1"'$  (6.389 m)  ? ? ?Weight:  ? ?Wt Readings from Last 1 Encounters:  ?01/17/22 87.3 kg  ? ? ?Ideal Body Weight:  83.6 kg ? ?BMI:  Body mass index is 25.39 kg/m?. ? ?Estimated Nutritional Needs:  ? ?Kcal:  1900-2200kcal/day ? ?Protein:  95-110g/day ? ?Fluid:  1.9-2.2L/day ? ?Koleen Distance MS, RD, LDN ?Please refer to AMION for RD and/or RD on-call/weekend/after hours pager ? ?

## 2022-01-17 NOTE — Procedures (Signed)
Extubation Procedure Note ? ?Patient Details:   ?Name: Deanna [name] ?DOB: May 01, 1951 ?MRN: [id number] ?  ?Airway Documentation:  ?  ?Vent end date: 01/17/22 Vent end time: 7619  ? ?Evaluation ? O2 sats: stable throughout and currently acceptable ?Complications: No apparent complications ?Patient did tolerate procedure well. ?Bilateral Breath Sounds: Clear, Diminished ?  ?Yes ? ?Patient extubated to 4L [place]. Cuff leak was heard. No stridor was noted. NP was at the bedside with RT during extubation. ? ?Tiburcio Bash ?01/17/2022, 11:30 AM ? ?

## 2022-01-17 NOTE — Consult Note (Signed)
PHARMACY CONSULT NOTE - FOLLOW UP ? ?Pharmacy Consult for Electrolyte Monitoring and Replacement  ? ?Recent Labs: ?Potassium (mmol/L)  ?Date Value  ?01/17/2022 4.2  ? ?Magnesium (mg/dL)  ?Date Value  ?01/17/2022 1.6 (L)  ? ?Calcium (mg/dL)  ?Date Value  ?01/17/2022 7.9 (L)  ? ?Albumin (g/dL)  ?Date Value  ?01/17/2022 2.4 (L)  ? ?Phosphorus (mg/dL)  ?Date Value  ?01/17/2022 5.2 (H)  ? ?Sodium (mmol/L)  ?Date Value  ?01/17/2022 131 (L)  ? ?4/25: Corrected calcium 9.2 mg/dL ? ?Assessment: ?71 yo F, with medical history of HFrEF (LVEF 30%, LV global hypokinesis, G1DD), CAD, T2DM, HTN, HLD, CKD Stage IV, Anemia of chronic disease, presenting to [facility] ED on 01/14/22 from home via EMS with complaints of angioedema due to ACEI.  ?  ?Potassium is within normal range today at 4.2 on 4/25 0545. Potassium was high yesterday at 6.4 on 4/24 0845, but decreased to 4.0 on 4/24 1725 after hemodialysis. ?  ?Phosphorus is slightly high likely due to end-stage renal disease. ? ?Magnesium is slightly low, at 1.6 on 4/25 1725. ? ?Sodium is slightly low, at 131 on 4/25 1725. ? ?Goal of Therapy:  ?K 4.0 - 5.0 ?Mg >2.0 ?Other electrolytes WNL ? ?Plan:  ?Magnesium sulfate 2g IV x 1 ordered by medical team. ? ?Continue to monitor sodium, magnesium. ? ?F/u with AM labs. ? ?Rebbeca Paul ,PharmD Candidate [place] [employer]25 ?01/17/2022 7:03 AM  ?

## 2022-01-17 NOTE — TOC Initial Note (Signed)
Transition of Care (TOC) - Initial/Assessment Note  ? ? ?Patient Details  ?Name: Oval Grumbine ?MRN: [id number] ?Date of Birth: 22-Nov-1950 ? ?Transition of Care (TOC) CM/SW Contact:    ?Shelbie Hutching, RN ?Phone Number: ?01/17/2022, 3:26 PM ? ?Clinical Narrative:                 ?Patient admitted to the hospital with angioedema, potentially from her lisinopril.  Patient required nasotracheal intubation in the OR on arrival to the hospital.  Swelling has gone down and patient was able to be extubated today.   ?RNCM met with patient and her caregiver, [name] English at the bedside this morning.  Patient is exhausted from the extubation but reports wanting something to eat and drink. ? ?Patient lives with [name], [name] provides all her transportation.  She just qualified for CAPs.   ?Plan will be for patient to return home with home health services.  [facility] set up at discharge on the 21st, unfortunately patient had to be readmitted the next day.   ? ?Patient is a dialysis patient TTHS.  ? ? RNCM will follow up with patient at the bedside tomorrow when she is feeling better. ? ?Expected Discharge Plan: [place] ?Barriers to Discharge: Continued Medical Work up ? ? ?Patient Goals and CMS Choice ?Patient states their goals for this hospitalization and ongoing recovery are:: patient unable to voice, intubated ?CMS Medicare.gov Compare Post Acute Care list provided to:: Patient ?Choice offered to / list presented to : Patient ? ?Expected Discharge Plan and Services ?Expected Discharge Plan: [place] ?  ?Discharge Planning Services: CM Consult ?Post Acute Care Choice: Home Health, Resumption of Svcs/PTA Provider ?Living arrangements for the past 2 months: [place] ?                ?DME Arranged: N/A ?DME Agency: NA ?  ?  ?  ?HH Arranged: RN, PT, OT ?[place] Agency: [facility] ?  ?  ?  ? ?Prior Living Arrangements/Services ?Living arrangements for the past 2  months: [place] ?Lives with:: Self, Relatives ?Patient language and need for interpreter reviewed:: Yes ?Do you feel safe going back to the place where you live?: Yes      ?Need for Family Participation in Patient Care: Yes (Comment) ?Care giver support system in place?: Yes (comment) ?  ?Criminal Activity/Legal Involvement Pertinent to Current Situation/Hospitalization: No - Comment as needed ? ?Activities of Daily Living ?Home Assistive Devices/Equipment: Wheelchair, Environmental consultant (specify type) ?ADL Screening (condition at time of admission) ?Patient's cognitive ability adequate to safely complete daily activities?: No ?Is the patient deaf or have difficulty hearing?: No ?Does the patient have difficulty seeing, even when wearing glasses/contacts?: No ?Does the patient have difficulty concentrating, remembering, or making decisions?: No ?Patient able to express need for assistance with ADLs?: Yes ?Does the patient have difficulty dressing or bathing?: Yes ?Independently performs ADLs?: No ?Communication: Independent ?Dressing (OT): Independent ?Grooming: Independent ?Feeding: Independent, Independent with device (comment) ?Bathing: Independent, Independent with device (comment) ?Toileting: Independent with device (comment) ?In/Out Bed: Independent with device (comment) ?Walks in Home: Independent with device (comment) ?Does the patient have difficulty walking or climbing stairs?: Yes ?Weakness of Legs: Both ?Weakness of Arms/Hands: Both ? ?Permission Sought/Granted ?Permission sought to share information with : Case Manager, Family Supports ?Permission granted to share information with : Yes, Verbal Permission Granted ? Share Information with NAME: [name] English ? Permission granted to share info w  AGENCY: Home Health agency ? Permission granted to share info w Relationship: granddaughter/caregiver ? Permission granted to share info w Contact Information: [phone] ? ?Emotional Assessment ?Appearance::  Appears stated age ?Attitude/Demeanor/Rapport: Lethargic ?Affect (typically observed): Accepting ?Orientation: : Oriented to Self ?Alcohol / Substance Use: Not Applicable ?Psych Involvement: No (comment) ? ?Admission diagnosis:  Angioedema [T78.3XXA] ?Patient Active Problem List  ? Diagnosis Date Noted  ? Angioedema 01/14/2022  ? Elevated troponin 01/08/2022  ? Hyperkalemia 01/08/2022  ? Acute right flank pain   ? Weakness of right lower extremity   ? Right sided numbness   ? CVA (cerebral vascular accident) ([place]) 12/28/2021  ? Hypokalemia 12/27/2021  ? Acute kidney injury superimposed on chronic kidney disease ([place]) 12/27/2021  ? Anemia 12/27/2021  ? Acute CVA (cerebrovascular accident) ([place]) 12/27/2021  ? Lobar pneumonia ([place]) 12/27/2021  ? Right sided weakness 12/26/2021  ? HFrEF (heart failure with reduced ejection fraction) ([place]) 10/31/2021  ? Angina of effort ([place]) 03/01/2016  ? Essential (primary) hypertension 06/24/2014  ? Type 2 diabetes mellitus without complications ([place]) 17/49/4496  ? ?PCP:  [facility] [place] ?Pharmacy:   ?[facility] ([facility]), [place] [facility] [place] ?[address] ([place]) [place] [address] ?Phone: [phone] Fax: [phone] ? ? ? ? ?Social Determinants of Health (SDOH) Interventions ?  ? ?Readmission Risk Interventions ? ?  01/16/2022  ?  3:45 PM 12/29/2021  ?  2:33 PM  ?Readmission Risk Prevention Plan  ?Transportation Screening Complete Complete  ?PCP or Specialist Appt within 5-7 Days  Complete  ?Home Care Screening  Complete  ?Medication Review (RN CM)  Complete  ?Medication Review Press photographer) Complete   ?PCP or Specialist appointment within 3-5 days of discharge Complete   ?[place] or Home Care Consult Complete   ?SW Recovery Care/Counseling Consult Complete   ?Palliative Care Screening Not Applicable   ?[place] Not Applicable   ? ? ? ?

## 2022-01-17 NOTE — Progress Notes (Signed)
Pt CBG 65, NP made aware and dextrose fluids ordered. 1/2 amp dextrose administered. Pt is refusing to let RN re-check CBG.  ?

## 2022-01-17 NOTE — Progress Notes (Signed)
?[facility]  ?ROUNDING NOTE  ? ?Subjective:  ? ?Deanna [name] is a 71 year old female with past medical histories including hypertension, CAD, CVA, and recently diagnosed end-stage renal disease with during hemodialysis.  Patient presents to the emergency department with facial swelling including mouth and tongue.  Patient has been admitted for Angioedema [T78.3XXA] ? ?Patient is known to our practice from previous admission and was initiated on dialysis during that admission.  Patient was assigned ?to [facility] for outpatient treatments on a MWF schedule, supervised by Dr. Holley Raring.  ? ?Update: ?Intubated and sedated ?Famotidine remains ?Angioedema improved ? ?UOP 765m in past 24 hours ?Dialysis yesterday, tolerated well. UF 1.5L acheived ? ?Objective:  ?Vital signs in last 24 hours:  ?Temp:  [97.3 ?F (36.3 ?C)-98.3 ?F (36.8 ?C)] 97.5 ?F (36.4 ?C) (04/25 0800) ?Pulse Rate:  [60-80] 73 (04/25 0900) ?Resp:  [12-23] 15 (04/25 0900) ?BP: (120-162)/(67-101) 150/80 (04/25 1044) ?SpO2:  [99 %-100 %] 100 % (04/25 0900) ?FiO2 (%):  [30 %] 30 % (04/25 1044) ?Weight:  [86.6 kg-87.3 kg] 87.3 kg (04/25 0500) ? ?Weight change: 3.3 kg ?Filed Weights  ? 01/16/22 1020 01/16/22 1421 01/17/22 0500  ?Weight: 88.3 kg 86.6 kg 87.3 kg  ? ? ?Intake/Output: ?I/O last 3 completed shifts: ?In: 1166.7 [I.V.:1116.7; IV Piggyback:50] ?Out: 3201 [Urine:1700; Other:1501] ?  ?Intake/Output this shift: ? Total I/O ?In: 125.3 [I.V.:125.3] ?Out: 150 [Urine:150] ? ?Physical Exam: ?General: Sedated  ?Head: Tongue edema improved  ?Eyes: Anicteric  ?Lungs:  Ventilator  ?Heart: Regular rate and rhythm  ?Abdomen:  Soft, nontender,   ?Extremities: 1+ peripheral edema.  ?Neurologic: Nonfocal, moving all four extremities  ?Skin: No lesions  ?Access: Right PermCath  ? ? ?Basic Metabolic Panel: ?Recent Labs  ?Lab 01/10/22 ?1316 01/12/22 ?0400 01/13/22 ?0790204/22/23 ?2229 01/15/22 ?0200 01/16/22 ?0350 01/16/22 ?0845 01/16/22 ?1725  01/17/22 ?04097 ?NA 135   < > 137 140 140 137  --   --  131*  ?K 5.1   < > 5.1 5.2* 5.0 6.3* 6.4* 4.0 4.2  ?CL 99   < > 101 102 104 104  --   --  96*  ?CO2 27   < > '26 26 27 23  '$ --   --  26  ?GLUCOSE 258*   < > 56* 104* 93 146*  --   --  166*  ?BUN 56*   < > 66* 44* 43* 53*  --   --  36*  ?CREATININE 2.98*   < > 2.89* 2.73* 2.59* 3.45*  --   --  2.70*  ?CALCIUM 8.4*   < > 8.3* 8.7* 8.4* 8.3*  --   --  7.9*  ?MG  --   --   --   --  1.7 1.7  --   --  1.6*  ?PHOS 5.0*  --   --   --  5.0* 6.6*  --   --  5.2*  ? < > = values in this interval not displayed.  ? ? ? ?Liver Function Tests: ?Recent Labs  ?Lab 01/10/22 ?1316 01/12/22 ?0400 01/15/22 ?0200 01/17/22 ?03532 ?AST  --  49* 76*  --   ?ALT  --  125* 117*  --   ?ALKPHOS  --  306* 344*  --   ?BILITOT  --  0.4 0.4  --   ?PROT  --  6.0* 6.8  --   ?ALBUMIN 2.3* 2.0* 2.4* 2.4*  ? ? ?No results for input(s): LIPASE, AMYLASE in the  last 168 hours. ?No results for input(s): AMMONIA in the last 168 hours. ? ?CBC: ?Recent Labs  ?Lab 01/13/22 ?7782 01/14/22 ?2229 01/15/22 ?0249 01/16/22 ?0350 01/17/22 ?4235  ?WBC 9.0 10.1 6.9 8.2 9.1  ?NEUTROABS  --  6.9 6.1  --   --   ?HGB 8.1* 9.3* 8.2* 8.5* 8.9*  ?HCT 26.3* 31.5* 27.5* 28.3* 28.7*  ?MCV 86.5 91.0 90.2 90.1 87.8  ?PLT 241 193 209 251 232  ? ? ? ?Cardiac Enzymes: ?No results for input(s): CKTOTAL, CKMB, CKMBINDEX, TROPONINI in the last 168 hours. ? ?BNP: ?Invalid input(s): POCBNP ? ?CBG: ?Recent Labs  ?Lab 01/16/22 ?2152 01/16/22 ?2330 01/17/22 ?3614 01/17/22 ?4315 01/17/22 ?1116  ?GLUCAP 95 95 118* 175* 125*  ? ? ? ?Microbiology: ?Results for orders placed or performed during the hospital encounter of 01/14/22  ?Resp Panel by RT-PCR (Flu A&B, Covid) Nasopharyngeal Swab     Status: None  ? Collection Time: 01/15/22 10:49 AM  ? Specimen: Nasopharyngeal Swab; Nasopharyngeal(NP) swabs in vial transport medium  ?Result Value Ref Range Status  ? SARS Coronavirus 2 by RT PCR NEGATIVE NEGATIVE Final  ?  Comment: (NOTE) ?SARS-CoV-2  target nucleic acids are NOT DETECTED. ? ?The SARS-CoV-2 RNA is generally detectable in upper respiratory ?specimens during the acute phase of infection. The lowest ?concentration of SARS-CoV-2 viral copies this assay can detect is ?138 copies/mL. A negative result does not preclude SARS-Cov-2 ?infection and should not be used as the sole basis for treatment or ?other patient management decisions. A negative result may occur with  ?improper specimen collection/handling, submission of specimen other ?than nasopharyngeal swab, presence of viral mutation(s) within the ?areas targeted by this assay, and inadequate number of viral ?copies(<138 copies/mL). A negative result must be combined with ?clinical observations, patient history, and epidemiological ?information. The expected result is Negative. ? ?Fact Sheet for Patients:  ?EntrepreneurPulse.com.au ? ?Fact Sheet for Healthcare Providers:  ?IncredibleEmployment.be ? ?This test is no t yet approved or cleared by the [place] FDA and  ?has been authorized for detection and/or diagnosis of SARS-CoV-2 by ?FDA under an Emergency Use Authorization (EUA). This EUA will remain  ?in effect (meaning this test can be used) for the duration of the ?COVID-19 declaration under Section 564(b)(1) of the Act, 21 ?U.S.C.section 360bbb-3(b)(1), unless the authorization is terminated  ?or revoked sooner.  ? ? ?  ? Influenza A by PCR NEGATIVE NEGATIVE Final  ? Influenza B by PCR NEGATIVE NEGATIVE Final  ?  Comment: (NOTE) ?The Xpert Xpress SARS-CoV-2/FLU/RSV plus assay is intended as an aid ?in the diagnosis of influenza from Nasopharyngeal swab specimens and ?should not be used as a sole basis for treatment. Nasal washings and ?aspirates are unacceptable for Xpert Xpress SARS-CoV-2/FLU/RSV ?testing. ? ?Fact Sheet for Patients: ?EntrepreneurPulse.com.au ? ?Fact Sheet for Healthcare  Providers: ?IncredibleEmployment.be ? ?This test is not yet approved or cleared by the [place] FDA and ?has been authorized for detection and/or diagnosis of SARS-CoV-2 by ?FDA under an Emergency Use Authorization (EUA). This EUA will remain ?in effect (meaning this test can be used) for the duration of the ?COVID-19 declaration under Section 564(b)(1) of the Act, 21 U.S.C. ?section 360bbb-3(b)(1), unless the authorization is terminated or ?revoked. ? ?Performed at [facility], [place], ?[place] [address] ?  ? ? ?Coagulation Studies: ?No results for input(s): LABPROT, INR in the last 72 hours. ? ?Urinalysis: ?No results for input(s): COLORURINE, LABSPEC, [place], GLUCOSEU, HGBUR, BILIRUBINUR, KETONESUR, PROTEINUR, UROBILINOGEN, NITRITE, LEUKOCYTESUR in  the last 72 hours. ? ?Invalid input(s): APPERANCEUR  ? ? ?Imaging: ?DG Chest Port 1 View ? ?Result Date: 01/17/2022 ?CLINICAL DATA:  Endotracheal tube present. EXAM: PORTABLE CHEST 1 VIEW COMPARISON:  Radiograph January 15, 2022. FINDINGS: Patient's body and head are rotated. Endotracheal tube with tip projecting at the level of the thoracic inlet. Dual lumen right approach central venous dialysis catheter with tip overlying the superior cavoatrial junction. The heart size and mediastinal contours are enlarged, unchanged. Low lung volumes with bibasilar atelectasis versus infiltrates. Blunting of the bilateral costophrenic angles may reflect small pleural effusions. The visualized skeletal structures are unchanged. IMPRESSION: 1. Endotracheal tube with tip projecting at the level of the thoracic inlet. 2. Similar cardiomegaly with vascular congestion, with low lung volumes and bibasilar atelectasis versus infiltrates. 3. Stable blunting of the bilateral costophrenic angles may reflect small pleural effusions. Electronically Signed   By: Dahlia Bailiff M.D.   On: 01/17/2022 08:11   ? ? ?Medications:  ? ? sodium chloride     ? sodium chloride    ? famotidine (PEPCID) IV 20 mg (01/17/22 0857)  ? fentaNYL infusion INTRAVENOUS 25 mcg/hr (01/16/22 2000)  ? magnesium sulfate bolus IVPB 2 g (01/17/22 1226)  ? propofol (DIPRIVAN) infusion 25 mcg/kg/min (01/17/22 0800)  ? ? chlorhexidi

## 2022-01-18 DIAGNOSIS — T783XXA Angioneurotic edema, initial encounter: Secondary | ICD-10-CM | POA: Diagnosis not present

## 2022-01-18 LAB — PHOSPHORUS: Phosphorus: 6.2 mg/dL — ABNORMAL HIGH (ref 2.5–4.6)

## 2022-01-18 LAB — BASIC METABOLIC PANEL
Anion gap: 14 (ref 5–15)
BUN: 48 mg/dL — ABNORMAL HIGH (ref 8–23)
CO2: 25 mmol/L (ref 22–32)
Calcium: 7.7 mg/dL — ABNORMAL LOW (ref 8.9–10.3)
Chloride: 96 mmol/L — ABNORMAL LOW (ref 98–111)
Creatinine, Ser: 3.31 mg/dL — ABNORMAL HIGH (ref 0.44–1.00)
GFR, Estimated: 14 mL/min — ABNORMAL LOW (ref >60)
Glucose, Bld: 192 mg/dL — ABNORMAL HIGH (ref 70–99)
Potassium: 4.9 mmol/L (ref 3.5–5.1)
Sodium: 135 mmol/L (ref 135–145)

## 2022-01-18 LAB — CBC WITH DIFFERENTIAL/PLATELET
Abs Immature Granulocytes: 0.18 10*3/uL — ABNORMAL HIGH (ref 0.00–0.07)
Basophils Absolute: 0 10*3/uL (ref 0.0–0.1)
Basophils Relative: 0 %
Eosinophils Absolute: 0 10*3/uL (ref 0.0–0.5)
Eosinophils Relative: 0 %
HCT: 30.1 % — ABNORMAL LOW (ref 36.0–46.0)
Hemoglobin: 9 g/dL — ABNORMAL LOW (ref 12.0–15.0)
Immature Granulocytes: 2 %
Lymphocytes Relative: 8 %
Lymphs Abs: 0.9 10*3/uL (ref 0.7–4.0)
MCH: 26.5 pg (ref 26.0–34.0)
MCHC: 29.9 g/dL — ABNORMAL LOW (ref 30.0–36.0)
MCV: 88.5 fL (ref 80.0–100.0)
Monocytes Absolute: 0.3 10*3/uL (ref 0.1–1.0)
Monocytes Relative: 3 %
Neutro Abs: 10.3 10*3/uL — ABNORMAL HIGH (ref 1.7–7.7)
Neutrophils Relative %: 87 %
Platelets: 234 10*3/uL (ref 150–400)
RBC: 3.4 MIL/uL — ABNORMAL LOW (ref 3.87–5.11)
RDW: 15.4 % (ref 11.5–15.5)
WBC: 11.7 10*3/uL — ABNORMAL HIGH (ref 4.0–10.5)
nRBC: 0.4 % — ABNORMAL HIGH (ref 0.0–0.2)

## 2022-01-18 LAB — GLUCOSE, CAPILLARY
Glucose-Capillary: 106 mg/dL — ABNORMAL HIGH (ref 70–99)
Glucose-Capillary: 158 mg/dL — ABNORMAL HIGH (ref 70–99)
Glucose-Capillary: 163 mg/dL — ABNORMAL HIGH (ref 70–99)
Glucose-Capillary: 226 mg/dL — ABNORMAL HIGH (ref 70–99)
Glucose-Capillary: 264 mg/dL — ABNORMAL HIGH (ref 70–99)
Glucose-Capillary: 89 mg/dL (ref 70–99)

## 2022-01-18 LAB — TRIGLYCERIDES: Triglycerides: 60 mg/dL (ref <150)

## 2022-01-18 LAB — MAGNESIUM: Magnesium: 2 mg/dL (ref 1.7–2.4)

## 2022-01-18 MED ORDER — RENA-VITE PO TABS
1.0000 | ORAL_TABLET | Freq: Every day | ORAL | Status: DC
  Administered 2022-01-18: 1 via ORAL
  Filled 2022-01-18: qty 1

## 2022-01-18 MED ORDER — INSULIN ASPART 100 UNIT/ML IJ SOLN
0.0000 [IU] | Freq: Four times a day (QID) | INTRAMUSCULAR | Status: DC
  Administered 2022-01-18: 5 [IU] via SUBCUTANEOUS
  Administered 2022-01-19 (×2): 3 [IU] via SUBCUTANEOUS
  Filled 2022-01-18 (×3): qty 1

## 2022-01-18 MED ORDER — FOLIC ACID 1 MG PO TABS
1.0000 mg | ORAL_TABLET | Freq: Every day | ORAL | Status: DC
  Administered 2022-01-19: 1 mg via ORAL
  Filled 2022-01-18 (×2): qty 1

## 2022-01-18 MED ORDER — DOCUSATE SODIUM 50 MG/5ML PO LIQD
100.0000 mg | Freq: Two times a day (BID) | ORAL | Status: DC
Start: 2022-01-18 — End: 2022-01-20
  Administered 2022-01-18 – 2022-01-19 (×3): 100 mg via ORAL
  Filled 2022-01-18 (×2): qty 10

## 2022-01-18 MED ORDER — EPOETIN ALFA 10000 UNIT/ML IJ SOLN
INTRAMUSCULAR | Status: AC
  Filled 2022-01-18: qty 1

## 2022-01-18 MED ORDER — HEPARIN SODIUM (PORCINE) 1000 UNIT/ML IJ SOLN
INTRAMUSCULAR | Status: AC
Start: 2022-01-18 — End: 2022-01-19
  Filled 2022-01-18: qty 10

## 2022-01-18 MED ORDER — INSULIN ASPART 100 UNIT/ML IJ SOLN: 0.0000 [IU] | Freq: Four times a day (QID) | INTRAMUSCULAR | Status: DC

## 2022-01-18 MED ORDER — POLYETHYLENE GLYCOL 3350 17 G PO PACK
17.0000 g | PACK | Freq: Every day | ORAL | Status: DC
Start: 2022-01-18 — End: 2022-01-20
  Administered 2022-01-18 – 2022-01-19 (×2): 17 g via ORAL
  Filled 2022-01-18: qty 1

## 2022-01-18 MED ORDER — NEPRO/CARBSTEADY PO LIQD
237.0000 mL | Freq: Three times a day (TID) | ORAL | Status: DC
  Administered 2022-01-18 – 2022-01-19 (×3): 237 mL via ORAL

## 2022-01-18 NOTE — Progress Notes (Signed)
Speech Language Pathology Treatment: Dysphagia  ?Patient Details ?Name: Deanna [name] ?MRN: [id number] ?DOB: 03/23/1951 ?Today's Date: 01/18/2022 ?Time: 8416-6063 ?SLP Time Calculation (min) (ACUTE ONLY): 20 min ? ?Assessment / Plan / Recommendation ?Clinical Impression ? Pt seen for follow up dysphagia intervention following initial evaluation on 01/17/22. Pt seen this AM, alert, and sitting upright in bed on room air. Pt with breakfast tray of pureed solids prepared on the bedside tray. Pt refusing puree trials from tray. PO trials completed consistent with mechanical soft (Dys 3) and regular solids. Single congested cough noted, delayed after regular solids, not repeated with further challenging. Pt demonstrating prolonged mastication and reported discomfort with effort for manipulation of regular solids. Increased oral efficiency noted with soft solids trials, with aid of verbal cues for slowing rate and use of liquid wash for complete oral clearance. Education shared regarding rationale for mechanical soft solid diet and need for slowing mastication and reducing bolus size to aid comfort and completeness for oral manipulation. Pt reported understanding. Recommend advancement to Mechanical Soft solids and thin liquids with aspiration precautions (slow rate, small bites, elevated HOB, and alert for PO intake. Continue with supervision for intake to ensure complete oral manipulation and limit impulsivity. NP and RN aware and in agreement with diet recommendations.  ? ?  ?HPI HPI: Pt is a 71 yo F presenting to [facility] ED on 01/14/22 from home via EMS with complaints of angioedema. Per the patient's granddaughter, [name], the patient took Lisinopril post d/c home (recent admit for R sided weakness; dialysis) for many years and after taking this medication tonight along with her other night time medications, she immediately felt swelling of her tongue and in her mouth/face. They attempted to treat at home with benadryl,  when that didn't work EMS was called. EMS administered IM epinephrine.  ED course:  Upon arrival to ED patient noted to have significant tongue swelling, so much that her Denture/teeth started to cut into her tongue. Dr. Tami Ribas from ENT was emergently consulted. Patient was taken to the OR for nasotracheal intubation on mechanical ventilatory support.  pt was extubated on 01/17/2022, ~ 3 days post intubation.   CXR 01/17/22: Similar cardiomegaly with vascular congestion, with low lung  volumes and bibasilar atelectasis versus infiltrates.  3. Stable blunting of the bilateral costophrenic angles may reflect  small pleural effusions.  Recent MRI this month(last admit): Punctate focus of reduced diffusion within the superior right  pericallosal white matter, likely a small acute/subacute infarct. No  hemorrhage or mass effect.  2. Moderate chronic small vessel ischemic changes and generalized  volume loss. BSE completed 01/17/22, with recommendation for Dys 1. Pt alert, sitting upright in bed and agreeable to intervention. ?  ?   ?SLP Plan ? Continue with current plan of care ? ?  ?  ?Recommendations for follow up therapy are one component of a multi-disciplinary discharge planning process, led by the attending physician.  Recommendations may be updated based on patient status, additional functional criteria and insurance authorization. ?  ? ?Recommendations  ?Diet recommendations: Dysphagia 3 (mechanical soft) ?Liquids provided via: Straw;Cup ?Medication Administration: Whole meds with puree (vs crushed in puree) ?Supervision: Patient able to self feed;Full supervision/cueing for compensatory strategies ?Compensations: Minimize environmental distractions;Slow rate;Small sips/bites;Follow solids with liquid ?Postural Changes and/or Swallow Maneuvers: Seated upright 90 degrees;Upright 30-60 min after meal  ?   ?    ?   ? ? ? ? Oral Care Recommendations: Oral care BID;Oral care before and after PO;Staff/trained caregiver  to provide oral care ?Follow Up Recommendations: Other (comment) (TBD) ?Assistance recommended at discharge: Frequent or constant Supervision/Assistance ?SLP Visit Diagnosis: Dysphagia, oropharyngeal phase (R13.12) ?Plan: Continue with current plan of care ? ? ? ? ?  ?  ?[place] J Clapp MS CCC-SLP ? ?Martinique J Clapp ? ?01/18/2022, 10:04 AM ?

## 2022-01-18 NOTE — Progress Notes (Addendum)
? ? ? ?Progress Note  ? ? ?Orchid Dragon  LGX:[id number] DOB: May 25, 1951  DOA: 01/14/2022 ?PCP: [facility] [place]  ? ? ? ? ?Brief Narrative:  ? ? ?Medical records reviewed and are as summarized below: ? ?Deanna [name] is a 71 y.o. 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  She had taken lisinopril and or "new cardiac med".  She had taken Benadryl at home without any relief.  EMS was called and she was given IM epinephrine prior to coming to the hospital. Chart review showed that she saw Dr. Baxter Hire on 10/28/2021 and she was advised to discontinue lisinopril and wait for 36 hours before starting Entresto.  However, patient said she took both lisinopril and Entresto. ? ? ?She was found to have significant tongue swelling, so much that she bit her tongue.  She was admitted to the hospital for angioedema likely from lisinopril or Entresto.  She was taken to the OR emergently and was intubated by ENT physician, Dr. Tami Ribas.  She was placed on mechanical ventilation for acute hypoxic respiratory failure. ? ? ? ? ? ? ?Assessment/Plan:  ? ?Principal Problem: ?  Angioedema ? ? ?Body mass index is 26.26 kg/m?. ? ? ?Acute hypoxic and hypercapnic respiratory failure: Resolved.  S/p extubation on 01/17/2022 ? ?Severe angioedema: Improved.  Continue IV dexamethasone.  Unknown whether this was from lisinopril or Entresto.  Patient will discontinue both at discharge. ? ?ESRD with hyperkalemia: Hyperkalemia has improved.  Plan for hemodialysis today. ? ?Type II DM with hyperglycemia: Use NovoLog as needed for hyperglycemia. ? ?Chronic systolic and diastolic CHF: Compensated ? ?Generalized weakness: Consult PT and OT ? ?History of stroke ? ?Diet Order   ? ?       ?  DIET DYS 3 Room  service appropriate? Yes; Fluid consistency: Thin  Diet effective now       ?  ? ?  ?  ? ?  ? ? ? ? ? ? ? ? ? ? ? ?Consultants: ?Intensivist ?Nephrologist ?Otolaryngologist ? ?Procedures: ?Intubation and mechanical ventilation on 01/15/2022 ? ? ? ?Medications:  ? ? chlorhexidine  15 mL Mouth Rinse BID  ? Chlorhexidine Gluconate Cloth  6 each Topical Daily  ? dexamethasone (DECADRON) injection  20 mg Intravenous Q24H  ? Followed by  ? [START ON 01/20/2022] dexamethasone (DECADRON) injection  10 mg Intravenous Q24H  ? docusate  100 mg Oral BID  ? epoetin (EPOGEN/PROCRIT) injection  10,000 Units Intravenous Q M,W,F-HD  ? feeding supplement (NEPRO CARB STEADY)  237 mL Oral TID BM  ? fentaNYL (SUBLIMAZE) injection  25 mcg Intravenous Once  ? [START ON 05/08/4817] folic acid  1 mg Oral Daily  ? heparin  5,000 Units Subcutaneous Q8H  ? insulin aspart  0-9 Units Subcutaneous Q6H  ? mouth rinse  15 mL Mouth Rinse q12n4p  ? multivitamin  1 tablet Oral QHS  ? polyethylene glycol  17 g Oral Daily  ? ?Continuous Infusions: ? sodium chloride    ? sodium chloride    ? famotidine (PEPCID) IV Stopped (01/18/22 0912)  ? ? ? ?Anti-infectives (From admission, onward)  ? ? None  ? ?  ? ? ? ? ? ? ? ? ? ?Family Communication/Anticipated D/C date and plan/Code Status  ? ?DVT prophylaxis: heparin injection  5,000 Units Start: 01/15/22 0000 ?SCDs Start: 01/14/22 2350 ? ? ?  Code Status: Full Code ? ?Family Communication: None ?Disposition Plan: Plan to discharge home tomorrow ? ? ?Status is: Inpatient ?Remains inpatient appropriate because: Generalized weakness ? ? ? ? ? ? ?Subjective:  ? ?Interval events noted.  No shortness of breath or chest pain.  She feels a little better today. ? ?Objective:  ? ? ?Vitals:  ? 01/18/22 0700 01/18/22 0800 01/18/22 0900 01/18/22 1128  ?BP: 137/66 140/72 (!) 157/83 (!) 146/70  ?Pulse: 91 88 98 88  ?Resp: 16 (!) '22 19 16  '$ ?Temp:  98.2 ?F (36.8 ?C)  97.7 ?F (36.5 ?C)  ?TempSrc:  Oral  Oral  ?SpO2: 100% 100% 99%  99%  ?Weight:      ?Height:      ? ?No data found. ? ? ?Intake/Output Summary (Last 24 hours) at 01/18/2022 1413 ?Last data filed at 01/18/2022 1128 ?Gross per 24 hour  ?Intake 1475.35 ml  ?Output 875 ml  ?Net 600.35 ml  ? ?Filed Weights  ? 01/16/22 1421 01/17/22 0500 01/18/22 0500  ?Weight: 86.6 kg 87.3 kg 90.3 kg  ? ? ?Exam: ? ?GEN: NAD ?SKIN: No rash ?EYES: EOMI ?ENT: MMM ?CV: RRR ?PULM: CTA B ?ABD: soft, ND, NT, +BS ?CNS: AAO x 3, non focal ?EXT: No edema or tenderness ? ? ? ?  ? ? ?Data Reviewed:  ? ?I have personally reviewed following labs and imaging studies: ? ?Labs: ?Labs show the following:  ? ?Basic Metabolic Panel: ?Recent Labs  ?Lab 01/14/22 ?2229 01/15/22 ?0200 01/16/22 ?0350 01/16/22 ?0865 01/17/22 ?7846 01/17/22 ?1637 01/18/22 ?0500  ?NA 140 140 137  --  131*  --  135  ?K 5.2* 5.0 6.3*   < > 4.2  --  4.9  ?CL 102 104 104  --  96*  --  96*  ?CO2 '26 27 23  '$ --  26  --  25  ?GLUCOSE 104* 93 146*  --  166*  --  192*  ?BUN 44* 43* 53*  --  36*  --  48*  ?CREATININE 2.73* 2.59* 3.45*  --  2.70*  --  3.31*  ?CALCIUM 8.7* 8.4* 8.3*  --  7.9*  --  7.7*  ?MG  --  1.7 1.7  --  1.6* 2.0 2.0  ?PHOS  --  5.0* 6.6*  --  5.2*  --  6.2*  ? < > = values in this interval not displayed.  ? ?GFR ?Estimated Creatinine Clearance: 18.8 mL/min (A) (by C-G formula based on SCr of 3.31 mg/dL (H)). ?Liver Function Tests: ?Recent Labs  ?Lab 01/12/22 ?0400 01/15/22 ?0200 01/17/22 ?9629  ?AST 49* 76*  --   ?ALT 125* 117*  --   ?ALKPHOS 306* 344*  --   ?BILITOT 0.4 0.4  --   ?PROT 6.0* 6.8  --   ?ALBUMIN 2.0* 2.4* 2.4*  ? ?No results for input(s): LIPASE, AMYLASE in the last 168 hours. ?No results for input(s): AMMONIA in the last 168 hours. ?Coagulation profile ?No results for input(s): INR, PROTIME in the last 168 hours. ? ?CBC: ?Recent Labs  ?Lab 01/14/22 ?2229 01/15/22 ?5284 01/16/22 ?0350 01/17/22 ?1324 01/18/22 ?0500  ?WBC 10.1 6.9 8.2 9.1 11.7*  ?NEUTROABS 6.9 6.1  --   --  10.3*  ?HGB 9.3* 8.2* 8.5* 8.9* 9.0*  ?HCT 31.5*  27.5* 28.3* 28.7* 30.1*  ?MCV 91.0 90.2 90.1 87.8 88.5  ?PLT 193 209 251 232 234  ? ?Cardiac Enzymes: ?  No results for input(s): CKTOTAL, CKMB, CKMBINDEX, TROPONINI in the last 168 hours. ?BNP (last 3 results) ?No results for input(s): PROBNP in the last 8760 hours. ?CBG: ?Recent Labs  ?Lab 01/17/22 ?1617 01/18/22 ?0015 01/18/22 ?7628 01/18/22 ?3151 01/18/22 ?1120  ?GLUCAP 65* 158* 89 226* 264*  ? ?D-Dimer: ?No results for input(s): DDIMER in the last 72 hours. ?Hgb A1c: ?No results for input(s): HGBA1C in the last 72 hours. ?Lipid Profile: ?Recent Labs  ?  01/18/22 ?0500  ?TRIG 60  ? ?Thyroid function studies: ?No results for input(s): TSH, T4TOTAL, T3FREE, THYROIDAB in the last 72 hours. ? ?Invalid input(s): FREET3 ?Anemia work up: ?No results for input(s): VITAMINB12, FOLATE, FERRITIN, TIBC, IRON, RETICCTPCT in the last 72 hours. ?Sepsis Labs: ?Recent Labs  ?Lab 01/15/22 ?0249 01/16/22 ?0350 01/17/22 ?7616 01/18/22 ?0500  ?WBC 6.9 8.2 9.1 11.7*  ? ? ?Microbiology ?Recent Results (from the past 240 hour(s))  ?Resp Panel by RT-PCR (Flu A&B, Covid) Nasopharyngeal Swab     Status: None  ? Collection Time: 01/15/22 10:49 AM  ? Specimen: Nasopharyngeal Swab; Nasopharyngeal(NP) swabs in vial transport medium  ?Result Value Ref Range Status  ? SARS Coronavirus 2 by RT PCR NEGATIVE NEGATIVE Final  ?  Comment: (NOTE) ?SARS-CoV-2 target nucleic acids are NOT DETECTED. ? ?The SARS-CoV-2 RNA is generally detectable in upper respiratory ?specimens during the acute phase of infection. The lowest ?concentration of SARS-CoV-2 viral copies this assay can detect is ?138 copies/mL. A negative result does not preclude SARS-Cov-2 ?infection and should not be used as the sole basis for treatment or ?other patient management decisions. A negative result may occur with  ?improper specimen collection/handling, submission of specimen other ?than nasopharyngeal swab, presence of viral mutation(s) within the ?areas targeted by this assay, and  inadequate number of viral ?copies(<138 copies/mL). A negative result must be combined with ?clinical observations, patient history, and epidemiological ?information. The expected result is Negative. ? ?Fact

## 2022-01-18 NOTE — Progress Notes (Signed)
Hemodialysis Post Treatment Note: ?  ?Treatment date: 01/18/2022 ?  ?Treatment time:  3 hours  ?  ?Access:  Right CVC ?  ?UF Removed: 1502 ml ?  ?Next Treatment: 01/20/22 ?  ?Patient started dialysis as ordered.  Towards the end of treatment verbalized that she wanted dinner, attempt was made to contact kitchen with no success. Patient was informed that kitchen was not able to be reached and became upset. Snack and drink was offered but patient refused. Blood sugar was checked and it was 106. No s/s of hypoglycemia noted or reported. V/S stable. Refused post assessment. Report given to floor nurse K. Alligoood, Therapist, sports. ? ? ? ? ?

## 2022-01-18 NOTE — Progress Notes (Signed)
Nutrition Follow Up Note  ? ?DOCUMENTATION CODES:  ? ?Not applicable ? ?INTERVENTION:  ? ?Nepro Shake po TID, each supplement provides 425 kcal and 19 grams protein ? ?Rena-vite po daily  ? ?Pt at refeed risk; recommend monitor potassium, magnesium and phosphorus labs daily until stable ? ?NUTRITION DIAGNOSIS:  ? ?Inadequate oral intake related to inability to eat (pt sedated and ventilated) as evidenced by NPO status. ?-resolved  ? ?GOAL:  ? ?Patient will meet greater than or equal to 90% of their needs ?-progressing  ? ?MONITOR:  ? ?PO intake, Supplement acceptance, Labs, Weight trends, Skin, I & O's ? ?ASSESSMENT:  ? ?71 y/o female with h/o HTN, CAD, CVA, ESRD on HD, DM, CVA and CHF who is admitted with angioedema. ? ?Pt seen by SLP today and placed on a mechanical soft diet. Pt sitting up in bed eating breakfast at time and RD visit today. Pt reports that she is hungry and is ready to eat. Pt completed 100% of her breakfast this morning. Pt enjoys Nepro supplements and was drinking these last admission. RD will add supplements and rena-vit to help pt meet her estimated needs and to replace losses from HD. Of note, pt noted to have folic acid deficiency 4/9; RD will add supplementation. Per chart, pt up ~14lbs since admission; pt -829m on her I & Os. UOP is 9765m  ? ?Medications reviewed and include: dexamethasone, colace, epoetin, heparin, insulin, miralax, pepcid ? ?Labs reviewed: K 4.9 wnl, BUN 48(H), creat 3.31(H), P 6.2(H), Mg 2.0 wnl ?Folate 5.4(L)- 4/9 ?Wbc- 11.7(H), Hgb 9.0(L), Hct 30.1(L) ?Cbgs- 264, 226, 89, 158 x 24 hrs ? ?Diet Order:   ?Diet Order   ? ?       ?  DIET DYS 3 Room service appropriate? Yes; Fluid consistency: Thin  Diet effective now       ?  ? ?  ?  ? ?  ? ?EDUCATION NEEDS:  ? ?No education needs have been identified at this time ? ?Skin:  Skin Assessment: Reviewed RN Assessment ? ?Last BM:  4/26- type 6 ? ?Height:  ? ?Ht Readings from Last 1 Encounters:  ?01/14/22 '6\' 1"'$  (1.854 m)   ? ? ?Weight:  ? ?Wt Readings from Last 1 Encounters:  ?01/18/22 90.3 kg  ? ? ?Ideal Body Weight:  83.6 kg ? ?BMI:  Body mass index is 26.26 kg/m?. ? ?Estimated Nutritional Needs:  ? ?Kcal:  1900-2200kcal/day ? ?Protein:  95-110g/day ? ?Fluid:  UOP +1L ? ?CaKoleen DistanceS, RD, LDN ?Please refer to AMION for RD and/or RD on-call/weekend/after hours pager ? ?

## 2022-01-18 NOTE — Consult Note (Signed)
PHARMACY CONSULT NOTE - FOLLOW UP ? ?Pharmacy Consult for Electrolyte Monitoring and Replacement  ? ?Recent Labs: ?Potassium (mmol/L)  ?Date Value  ?01/18/2022 4.9  ? ?Magnesium (mg/dL)  ?Date Value  ?01/18/2022 2.0  ? ?Calcium (mg/dL)  ?Date Value  ?01/18/2022 7.7 (L)  ? ?Albumin (g/dL)  ?Date Value  ?01/17/2022 2.4 (L)  ? ?Phosphorus (mg/dL)  ?Date Value  ?01/18/2022 6.2 (H)  ? ?Sodium (mmol/L)  ?Date Value  ?01/18/2022 135  ? ?4/26: Corrected calcium 9.0 mg/dL ? ?Assessment: ?71 yo F, with medical history of HFrEF (LVEF 30%, LV global hypokinesis, G1DD), CAD, T2DM, HTN, HLD, CKD Stage IV, Anemia of chronic disease, presenting to [facility] ED on 01/14/22 from home via EMS with complaints of angioedema due to ACEI.  ?  ?Potassium is within range, at 4.9 mmol/L on 4/26 0500. ?Magnesium is within range, at 2.0 mg/dL on 4/26 0500. ?Phosphorus is slightly high likely due to end-stage renal disease. ? ?Goal of Therapy:  ?K 4.0 - 5.1 mmol/L ?Mg 2.0 - 2.4 mg/dL ?Other electrolytes WNL ? ?Plan:  ?No further replacement at the moment. ?F/u with AM labs. ? ?Rebbeca Paul ,PharmD Candidate [place] [employer]25 ?01/18/2022 7:35 AM  ?

## 2022-01-18 NOTE — TOC Progression Note (Signed)
Transition of Care (TOC) - Progression Note  ? ? ?Patient Details  ?Name: Deanna [name] ?MRN: [id number] ?Date of Birth: 1951-06-28 ? ?Transition of Care (TOC) CM/SW Contact  ?Shelbie Hutching, RN ?Phone Number: ?01/18/2022, 11:52 AM ? ?Clinical Narrative:    ?RNCM met with patient at the bedside, patient's son Major and her daughter [place] Carbo are sitting at the beside with patient.  Daughter lives in [place] and her son lives close to [place], they would like to move the patient to [place] to be closer to them.  Patient does not confirm that this is what she wants, she says she "guesses she will do whatever they want me too."  Patient has been living with a caregiver, [name], she is not her granddaughter.  Patient asked about choosing a HCPOA, chaplin dropped off paperwork for it at the bedside.  RNCM encouraged her to think about it. ? ?Patient does agree to short term rehab- family asking about facilities in [place]- patient would have to change dialysis centers- told the family that if they had a particular facility in mind TOC could reach out to them. ? ?TOC will cont to follow and assist with discharge disposition.,  ? ? ?Expected Discharge Plan: [place] ?Barriers to Discharge: Continued Medical Work up ? ?Expected Discharge Plan and Services ?Expected Discharge Plan: [address] ?  ?Discharge Planning Services: CM Consult ?Post Acute Care Choice: Home Health, Resumption of Svcs/PTA Provider ?Living arrangements for the past 2 months: [place] ?                ?DME Arranged: N/A ?DME Agency: NA ?  ?  ?  ?HH Arranged: RN, PT, OT ?[place] Agency: [place] ?  ?  ?  ? ? ?Social Determinants of Health (SDOH) Interventions ?  ? ?Readmission Risk Interventions ? ?  01/16/2022  ?  3:45 PM 12/29/2021  ?  2:33 PM  ?Readmission Risk Prevention Plan  ?Transportation Screening Complete Complete  ?PCP or Specialist Appt within 5-7 Days  Complete  ?Home Care Screening   Complete  ?Medication Review (RN CM)  Complete  ?Medication Review Press photographer) Complete   ?PCP or Specialist appointment within 3-5 days of discharge Complete   ?[place] or Home Care Consult Complete   ?SW Recovery Care/Counseling Consult Complete   ?Palliative Care Screening Not Applicable   ?[place] Not Applicable   ? ? ?

## 2022-01-18 NOTE — Progress Notes (Signed)
Received report from HD Nurse. Pt is en route to room 16. Pt connected back to monitor on arrival. Pt is CAOx4. VSS. Resp even and unlabored on RA. No acute distress noted.  ?

## 2022-01-18 NOTE — Progress Notes (Signed)
?[facility]  ?ROUNDING NOTE  ? ?Subjective:  ? ?Deanna [name] is a 71 year old female with past medical histories including hypertension, CAD, CVA, and recently diagnosed end-stage renal disease with during hemodialysis.  Patient presents to the emergency department with facial swelling including mouth and tongue.  Patient has been admitted for Angioedema [T78.3XXA] ? ?Patient is known to our practice from previous admission and was initiated on dialysis during that admission.  Patient was assigned ?to [facility] for outpatient treatments on a MWF schedule, supervised by Dr. Holley Raring.  ? ?Update: ?Patient seen sitting up in bed, working with speech therapy and swallow evaluation ?Alert and oriented ?Extubated on 01/17/2022, Room air ?Angioedema resolved, complains of oral soreness  ? ?IV fluids at 50 mils per hour ?Recorded urine output of 975 mL in preceding 24 hours ? ?Objective:  ?Vital signs in last 24 hours:  ?Temp:  [97.6 ?F (36.4 ?C)-98.3 ?F (36.8 ?C)] 98.2 ?F (36.8 ?C) (04/26 0800) ?Pulse Rate:  [72-98] 98 (04/26 0900) ?Resp:  [0-32] 19 (04/26 0900) ?BP: (113-158)/(53-112) 157/83 (04/26 0900) ?SpO2:  [98 %-100 %] 99 % (04/26 0900) ?FiO2 (%):  [30 %] 30 % (04/25 1044) ?Weight:  [90.3 kg] 90.3 kg (04/26 0500) ? ?Weight change: 2 kg ?Filed Weights  ? 01/16/22 1421 01/17/22 0500 01/18/22 0500  ?Weight: 86.6 kg 87.3 kg 90.3 kg  ? ? ?Intake/Output: ?I/O last 3 completed shifts: ?In: 1938.6 [P.O.:250; I.V.:1538.6; IV Piggyback:150] ?Out: 1375 [ERXVQ:0086] ?  ?Intake/Output this shift: ? Total I/O ?In: 262.4 [I.V.:244.2; IV Piggyback:18.2] ?Out: 150 [Urine:150] ? ?Physical Exam: ?General: NAD  ?Head: Oral edema resolved, moist oral mucosa  ?Eyes: Anicteric  ?Lungs:  Clear with diminished bases, normal effort, room air  ?Heart: Regular rate and rhythm  ?Abdomen:  Soft, nontender  ?Extremities: Trace peripheral edema.  ?Neurologic: Nonfocal, moving all four extremities  ?Skin: No lesions  ?Access:  Right PermCath  ? ? ?Basic Metabolic Panel: ?Recent Labs  ?Lab 01/14/22 ?2229 01/15/22 ?0200 01/16/22 ?0350 01/16/22 ?0845 01/16/22 ?1725 01/17/22 ?7619 01/17/22 ?1637 01/18/22 ?0500  ?NA 140 140 137  --   --  131*  --  135  ?K 5.2* 5.0 6.3* 6.4* 4.0 4.2  --  4.9  ?CL 102 104 104  --   --  96*  --  96*  ?CO2 '26 27 23  '$ --   --  26  --  25  ?GLUCOSE 104* 93 146*  --   --  166*  --  192*  ?BUN 44* 43* 53*  --   --  36*  --  48*  ?CREATININE 2.73* 2.59* 3.45*  --   --  2.70*  --  3.31*  ?CALCIUM 8.7* 8.4* 8.3*  --   --  7.9*  --  7.7*  ?MG  --  1.7 1.7  --   --  1.6* 2.0 2.0  ?PHOS  --  5.0* 6.6*  --   --  5.2*  --  6.2*  ? ? ? ?Liver Function Tests: ?Recent Labs  ?Lab 01/12/22 ?0400 01/15/22 ?0200 01/17/22 ?5093  ?AST 49* 76*  --   ?ALT 125* 117*  --   ?ALKPHOS 306* 344*  --   ?BILITOT 0.4 0.4  --   ?PROT 6.0* 6.8  --   ?ALBUMIN 2.0* 2.4* 2.4*  ? ? ?No results for input(s): LIPASE, AMYLASE in the last 168 hours. ?No results for input(s): AMMONIA in the last 168 hours. ? ?CBC: ?Recent Labs  ?Lab 01/14/22 ?2229 01/15/22 ?  1884 01/16/22 ?0350 01/17/22 ?1660 01/18/22 ?0500  ?WBC 10.1 6.9 8.2 9.1 11.7*  ?NEUTROABS 6.9 6.1  --   --  10.3*  ?HGB 9.3* 8.2* 8.5* 8.9* 9.0*  ?HCT 31.5* 27.5* 28.3* 28.7* 30.1*  ?MCV 91.0 90.2 90.1 87.8 88.5  ?PLT 193 209 251 232 234  ? ? ? ?Cardiac Enzymes: ?No results for input(s): CKTOTAL, CKMB, CKMBINDEX, TROPONINI in the last 168 hours. ? ?BNP: ?Invalid input(s): POCBNP ? ?CBG: ?Recent Labs  ?Lab 01/17/22 ?1116 01/17/22 ?1617 01/18/22 ?0015 01/18/22 ?6301 01/18/22 ?6010  ?GLUCAP 125* 65* 158* 89 226*  ? ? ? ?Microbiology: ?Results for orders placed or performed during the hospital encounter of 01/14/22  ?Resp Panel by RT-PCR (Flu A&B, Covid) Nasopharyngeal Swab     Status: None  ? Collection Time: 01/15/22 10:49 AM  ? Specimen: Nasopharyngeal Swab; Nasopharyngeal(NP) swabs in vial transport medium  ?Result Value Ref Range Status  ? SARS Coronavirus 2 by RT PCR NEGATIVE NEGATIVE Final  ?   Comment: (NOTE) ?SARS-CoV-2 target nucleic acids are NOT DETECTED. ? ?The SARS-CoV-2 RNA is generally detectable in upper respiratory ?specimens during the acute phase of infection. The lowest ?concentration of SARS-CoV-2 viral copies this assay can detect is ?138 copies/mL. A negative result does not preclude SARS-Cov-2 ?infection and should not be used as the sole basis for treatment or ?other patient management decisions. A negative result may occur with  ?improper specimen collection/handling, submission of specimen other ?than nasopharyngeal swab, presence of viral mutation(s) within the ?areas targeted by this assay, and inadequate number of viral ?copies(<138 copies/mL). A negative result must be combined with ?clinical observations, patient history, and epidemiological ?information. The expected result is Negative. ? ?Fact Sheet for Patients:  ?EntrepreneurPulse.com.au ? ?Fact Sheet for Healthcare Providers:  ?IncredibleEmployment.be ? ?This test is no t yet approved or cleared by the [place] FDA and  ?has been authorized for detection and/or diagnosis of SARS-CoV-2 by ?FDA under an Emergency Use Authorization (EUA). This EUA will remain  ?in effect (meaning this test can be used) for the duration of the ?COVID-19 declaration under Section 564(b)(1) of the Act, 21 ?U.S.C.section 360bbb-3(b)(1), unless the authorization is terminated  ?or revoked sooner.  ? ? ?  ? Influenza A by PCR NEGATIVE NEGATIVE Final  ? Influenza B by PCR NEGATIVE NEGATIVE Final  ?  Comment: (NOTE) ?The Xpert Xpress SARS-CoV-2/FLU/RSV plus assay is intended as an aid ?in the diagnosis of influenza from Nasopharyngeal swab specimens and ?should not be used as a sole basis for treatment. Nasal washings and ?aspirates are unacceptable for Xpert Xpress SARS-CoV-2/FLU/RSV ?testing. ? ?Fact Sheet for Patients: ?EntrepreneurPulse.com.au ? ?Fact Sheet for Healthcare  Providers: ?IncredibleEmployment.be ? ?This test is not yet approved or cleared by the [place] FDA and ?has been authorized for detection and/or diagnosis of SARS-CoV-2 by ?FDA under an Emergency Use Authorization (EUA). This EUA will remain ?in effect (meaning this test can be used) for the duration of the ?COVID-19 declaration under Section 564(b)(1) of the Act, 21 U.S.C. ?section 360bbb-3(b)(1), unless the authorization is terminated or ?revoked. ? ?Performed at [facility], [place], ?[place] [address] ?  ? ? ?Coagulation Studies: ?No results for input(s): LABPROT, INR in the last 72 hours. ? ?Urinalysis: ?No results for input(s): COLORURINE, LABSPEC, [place], GLUCOSEU, HGBUR, BILIRUBINUR, KETONESUR, PROTEINUR, UROBILINOGEN, NITRITE, LEUKOCYTESUR in the last 72 hours. ? ?Invalid input(s): APPERANCEUR  ? ? ?Imaging: ?DG Chest Port 1 View ? ?Result Date: 01/17/2022 ?CLINICAL DATA:  Endotracheal tube present. EXAM: PORTABLE CHEST 1 VIEW COMPARISON:  Radiograph January 15, 2022. FINDINGS: Patient's body and head are rotated. Endotracheal tube with tip projecting at the level of the thoracic inlet. Dual lumen right approach central venous dialysis catheter with tip overlying the superior cavoatrial junction. The heart size and mediastinal contours are enlarged, unchanged. Low lung volumes with bibasilar atelectasis versus infiltrates. Blunting of the bilateral costophrenic angles may reflect small pleural effusions. The visualized skeletal structures are unchanged. IMPRESSION: 1. Endotracheal tube with tip projecting at the level of the thoracic inlet. 2. Similar cardiomegaly with vascular congestion, with low lung volumes and bibasilar atelectasis versus infiltrates. 3. Stable blunting of the bilateral costophrenic angles may reflect small pleural effusions. Electronically Signed   By: Dahlia Bailiff M.D.   On: 01/17/2022 08:11   ? ? ?Medications:  ? ? sodium chloride     ? sodium chloride    ? famotidine (PEPCID) IV 100 mL/hr at 01/18/22 0853  ? ? chlorhexidine  15 mL Mouth Rinse BID  ? Chlorhexidine Gluconate Cloth  6 each Topical Daily  ? dexamethasone (DECADRON) injection  20 mg Intraveno

## 2022-01-18 NOTE — Progress Notes (Signed)
?  Chaplain On-Call responded to [place] Order for Advance Directives information for the patient. ? ?Chaplain met the patient and learned that she wants her grand-daughter to be named HCPOA. ? ?Chaplain gave the AD documents to the patient, who will discuss further with her granddaughter. ? ?Chaplain Pollyann Samples ?M.Div., B[facility] ?

## 2022-01-19 DIAGNOSIS — J9601 Acute respiratory failure with hypoxia: Secondary | ICD-10-CM | POA: Diagnosis not present

## 2022-01-19 DIAGNOSIS — J9602 Acute respiratory failure with hypercapnia: Secondary | ICD-10-CM | POA: Diagnosis not present

## 2022-01-19 DIAGNOSIS — T783XXA Angioneurotic edema, initial encounter: Secondary | ICD-10-CM | POA: Diagnosis not present

## 2022-01-19 LAB — CBC WITH DIFFERENTIAL/PLATELET
Abs Immature Granulocytes: 0.16 10*3/uL — ABNORMAL HIGH (ref 0.00–0.07)
Basophils Absolute: 0 10*3/uL (ref 0.0–0.1)
Basophils Relative: 0 %
Eosinophils Absolute: 0.1 10*3/uL (ref 0.0–0.5)
Eosinophils Relative: 1 %
HCT: 28.8 % — ABNORMAL LOW (ref 36.0–46.0)
Hemoglobin: 8.8 g/dL — ABNORMAL LOW (ref 12.0–15.0)
Immature Granulocytes: 2 %
Lymphocytes Relative: 9 %
Lymphs Abs: 0.9 10*3/uL (ref 0.7–4.0)
MCH: 27 pg (ref 26.0–34.0)
MCHC: 30.6 g/dL (ref 30.0–36.0)
MCV: 88.3 fL (ref 80.0–100.0)
Monocytes Absolute: 0.3 10*3/uL (ref 0.1–1.0)
Monocytes Relative: 3 %
Neutro Abs: 7.9 10*3/uL — ABNORMAL HIGH (ref 1.7–7.7)
Neutrophils Relative %: 85 %
Platelets: 225 10*3/uL (ref 150–400)
RBC: 3.26 MIL/uL — ABNORMAL LOW (ref 3.87–5.11)
RDW: 15.1 % (ref 11.5–15.5)
WBC: 9.4 10*3/uL (ref 4.0–10.5)
nRBC: 0.7 % — ABNORMAL HIGH (ref 0.0–0.2)

## 2022-01-19 LAB — BASIC METABOLIC PANEL
Anion gap: 9 (ref 5–15)
BUN: 31 mg/dL — ABNORMAL HIGH (ref 8–23)
CO2: 27 mmol/L (ref 22–32)
Calcium: 7.7 mg/dL — ABNORMAL LOW (ref 8.9–10.3)
Chloride: 99 mmol/L (ref 98–111)
Creatinine, Ser: 2.32 mg/dL — ABNORMAL HIGH (ref 0.44–1.00)
GFR, Estimated: 22 mL/min — ABNORMAL LOW (ref >60)
Glucose, Bld: 224 mg/dL — ABNORMAL HIGH (ref 70–99)
Potassium: 3.7 mmol/L (ref 3.5–5.1)
Sodium: 135 mmol/L (ref 135–145)

## 2022-01-19 LAB — GLUCOSE, CAPILLARY
Glucose-Capillary: 99 mg/dL (ref 70–99)
Glucose-Capillary: 161 mg/dL — ABNORMAL HIGH (ref 70–99)
Glucose-Capillary: 215 mg/dL — ABNORMAL HIGH (ref 70–99)
Glucose-Capillary: 247 mg/dL — ABNORMAL HIGH (ref 70–99)
Glucose-Capillary: 101 mg/dL — ABNORMAL HIGH (ref 70–99)

## 2022-01-19 LAB — MAGNESIUM: Magnesium: 1.7 mg/dL (ref 1.7–2.4)

## 2022-01-19 LAB — PHOSPHORUS: Phosphorus: 3.7 mg/dL (ref 2.5–4.6)

## 2022-01-19 MED ORDER — EPOETIN ALFA 10000 UNIT/ML IJ SOLN: 4000.0000 [IU] | INTRAMUSCULAR | Status: DC

## 2022-01-19 MED ORDER — METOPROLOL TARTRATE 25 MG PO TABS
25.0000 mg | ORAL_TABLET | Freq: Two times a day (BID) | ORAL | Status: DC
  Administered 2022-01-19: 25 mg via ORAL
  Filled 2022-01-19: qty 1

## 2022-01-19 MED ORDER — ACETAMINOPHEN 325 MG PO TABS: 650.0000 mg | ORAL_TABLET | Freq: Four times a day (QID) | ORAL | Status: DC | PRN

## 2022-01-19 MED ORDER — FUROSEMIDE 20 MG PO TABS
20.0000 mg | ORAL_TABLET | Freq: Every day | ORAL | Status: DC
Start: 2022-01-19 — End: 2022-01-20
  Administered 2022-01-19: 20 mg via ORAL
  Filled 2022-01-19: qty 1

## 2022-01-19 NOTE — Progress Notes (Signed)
PT Cancellation Note ? ?Patient Details ?Name: Deanna [name] ?MRN: [id number] ?DOB: 02/11/1951 ? ? ?Cancelled Treatment:    Reason Eval/Treat Not Completed: Patient declined, no reason specified (Consult received and chart reviewed. Evaluation attempted, but patient persistently refusing due to fatigue. Unable to redirect despite max encouragement. Will continue efforts at later time/date as appropriate.) ? ? ?Dewaun Kinzler H. Owens Shark, PT, DPT, NCS ?01/19/22, 12:14 PM ?[phone] ? ?

## 2022-01-19 NOTE — Progress Notes (Signed)
?[facility]  ?ROUNDING NOTE  ? ?Subjective:  ? ?Deanna [name] is a 71 year old female with past medical histories including hypertension, CAD, CVA, and recently diagnosed end-stage renal disease with during hemodialysis.  Patient presents to the emergency department with facial swelling including mouth and tongue.  Patient has been admitted for Angioedema [T78.3XXA] ? ?Patient is known to our practice from previous admission and was initiated on dialysis during that admission.  Patient was assigned ?to [facility] for outpatient treatments on a MWF schedule, supervised by Dr. Holley Raring.  ? ?Update: ? ?Patient sitting up in bed ?States she feels well today.  ?Tolerating meals, no nausea and vomiting ?Remains on room air ? ? ?Objective:  ?Vital signs in last 24 hours:  ?Temp:  [97.8 ?F (36.6 ?C)-98 ?F (36.7 ?C)] 98 ?F (36.7 ?C) (04/27 0755) ?Pulse Rate:  [80-100] 98 (04/27 1000) ?Resp:  [11-26] 21 (04/27 1000) ?BP: (116-184)/(59-102) 180/102 (04/27 1000) ?SpO2:  [94 %-100 %] 99 % (04/27 1000) ?Weight:  [82.3 kg-84 kg] 82.3 kg (04/27 0500) ? ?Weight change: -6.3 kg ?Filed Weights  ? 01/18/22 0500 01/18/22 1707 01/19/22 0500  ?Weight: 90.3 kg 84 kg 82.3 kg  ? ? ?Intake/Output: ?I/O last 3 completed shifts: ?In: 1980.9 [P.O.:1005; I.V.:926; IV Piggyback:49.9] ?Out: 3127 [Urine:1625; Other:1502] ?  ?Intake/Output this shift: ? Total I/O ?In: 240 [P.O.:240] ?Out: -  ? ?Physical Exam: ?General: NAD  ?Head: Oral edema resolved, moist oral mucosa  ?Eyes: Anicteric  ?Lungs:  Clear with diminished bases, normal effort, room air  ?Heart: Regular rate and rhythm  ?Abdomen:  Soft, nontender  ?Extremities: Trace peripheral edema.  ?Neurologic: Nonfocal, moving [name] four extremities  ?Skin: No lesions  ?Access: Right PermCath  ? ? ?Basic Metabolic Panel: ?Recent Labs  ?Lab 01/15/22 ?0200 01/16/22 ?0350 01/16/22 ?0845 01/16/22 ?1725 01/17/22 ?2376 01/17/22 ?1637 01/18/22 ?0500 01/19/22 ?0530  ?NA 140 137  --   --   131*  --  135 135  ?K 5.0 6.3* 6.4* 4.0 4.2  --  4.9 3.7  ?CL 104 104  --   --  96*  --  96* 99  ?CO2 27 23  --   --  26  --  25 27  ?GLUCOSE 93 146*  --   --  166*  --  192* 224*  ?BUN 43* 53*  --   --  36*  --  48* 31*  ?CREATININE 2.59* 3.45*  --   --  2.70*  --  3.31* 2.32*  ?CALCIUM 8.4* 8.3*  --   --  7.9*  --  7.7* 7.7*  ?MG 1.7 1.7  --   --  1.6* 2.0 2.0 1.7  ?PHOS 5.0* 6.6*  --   --  5.2*  --  6.2* 3.7  ? ? ? ?Liver Function Tests: ?Recent Labs  ?Lab 01/15/22 ?0200 01/17/22 ?2831  ?AST 76*  --   ?ALT 117*  --   ?ALKPHOS 344*  --   ?BILITOT 0.4  --   ?PROT 6.8  --   ?ALBUMIN 2.4* 2.4*  ? ? ?No results for input(s): LIPASE, AMYLASE in the last 168 hours. ?No results for input(s): AMMONIA in the last 168 hours. ? ?CBC: ?Recent Labs  ?Lab 01/14/22 ?2229 01/15/22 ?5176 01/16/22 ?0350 01/17/22 ?1607 01/18/22 ?0500 01/19/22 ?0530  ?WBC 10.1 6.9 8.2 9.1 11.7* 9.4  ?NEUTROABS 6.9 6.1  --   --  10.3* 7.9*  ?HGB 9.3* 8.2* 8.5* 8.9* 9.0* 8.8*  ?HCT 31.5* 27.5* 28.3* 28.7* 30.1*  28.8*  ?MCV 91.0 90.2 90.1 87.8 88.5 88.3  ?PLT 193 209 251 232 234 225  ? ? ? ?Cardiac Enzymes: ?No results for input(s): CKTOTAL, CKMB, CKMBINDEX, TROPONINI in the last 168 hours. ? ?BNP: ?Invalid input(s): POCBNP ? ?CBG: ?Recent Labs  ?Lab 01/18/22 ?2008 01/18/22 ?2131 01/19/22 ?0251 01/19/22 ?2440 01/19/22 ?1125  ?GLUCAP 106* 99 161* 215* 247*  ? ? ? ?Microbiology: ?Results for orders placed or performed during the hospital encounter of 01/14/22  ?Resp Panel by RT-PCR (Flu A&B, Covid) Nasopharyngeal Swab     Status: None  ? Collection Time: 01/15/22 10:49 AM  ? Specimen: Nasopharyngeal Swab; Nasopharyngeal(NP) swabs in vial transport medium  ?Result Value Ref Range Status  ? SARS Coronavirus 2 by RT PCR NEGATIVE NEGATIVE Final  ?  Comment: (NOTE) ?SARS-CoV-2 target nucleic acids are NOT DETECTED. ? ?The SARS-CoV-2 RNA is generally detectable in upper respiratory ?specimens during the acute phase of infection. The lowest ?concentration of  SARS-CoV-2 viral copies this assay can detect is ?138 copies/mL. A negative result does not preclude SARS-Cov-2 ?infection and should not be used as the sole basis for treatment or ?other patient management decisions. A negative result may occur with  ?improper specimen collection/handling, submission of specimen other ?than nasopharyngeal swab, presence of viral mutation(s) within the ?areas targeted by this assay, and inadequate number of viral ?copies(<138 copies/mL). A negative result must be combined with ?clinical observations, patient history, and epidemiological ?information. The expected result is Negative. ? ?Fact Sheet for Patients:  ?EntrepreneurPulse.com.au ? ?Fact Sheet for Healthcare Providers:  ?IncredibleEmployment.be ? ?This test is no t yet approved or cleared by the [place] FDA and  ?has been authorized for detection and/or diagnosis of SARS-CoV-2 by ?FDA under an Emergency Use Authorization (EUA). This EUA will remain  ?in effect (meaning this test can be used) for the duration of the ?COVID-19 declaration under Section 564(b)(1) of the Act, 21 ?U.S.C.section 360bbb-3(b)(1), unless the authorization is terminated  ?or revoked sooner.  ? ? ?  ? Influenza A by PCR NEGATIVE NEGATIVE Final  ? Influenza B by PCR NEGATIVE NEGATIVE Final  ?  Comment: (NOTE) ?The Xpert Xpress SARS-CoV-2/FLU/RSV plus assay is intended as an aid ?in the diagnosis of influenza from Nasopharyngeal swab specimens and ?should not be used as a sole basis for treatment. Nasal washings and ?aspirates are unacceptable for Xpert Xpress SARS-CoV-2/FLU/RSV ?testing. ? ?Fact Sheet for Patients: ?EntrepreneurPulse.com.au ? ?Fact Sheet for Healthcare Providers: ?IncredibleEmployment.be ? ?This test is not yet approved or cleared by the [place] FDA and ?has been authorized for detection and/or diagnosis of SARS-CoV-2 by ?FDA under an Emergency Use  Authorization (EUA). This EUA will remain ?in effect (meaning this test can be used) for the duration of the ?COVID-19 declaration under Section 564(b)(1) of the Act, 21 U.S.C. ?section 360bbb-3(b)(1), unless the authorization is terminated or ?revoked. ? ?Performed at [facility], [place], ?[place] [address] ?  ? ? ?Coagulation Studies: ?No results for input(s): LABPROT, INR in the last 72 hours. ? ?Urinalysis: ?No results for input(s): COLORURINE, LABSPEC, [place], GLUCOSEU, HGBUR, BILIRUBINUR, KETONESUR, PROTEINUR, UROBILINOGEN, NITRITE, LEUKOCYTESUR in the last 72 hours. ? ?Invalid input(s): APPERANCEUR  ? ? ?Imaging: ?No results found. ? ? ?Medications:  ? ? sodium chloride    ? sodium chloride    ? famotidine (PEPCID) IV 20 mg (01/19/22 0949)  ? ? chlorhexidine  15 mL Mouth Rinse BID  ? Chlorhexidine Gluconate Cloth  6 each Topical Daily  ? [  START ON 01/20/2022] dexamethasone (DECADRON) injection  10 mg Intravenous Q24H  ? docusate  100 mg Oral BID  ? epoetin (EPOGEN/PROCRIT) injection  10,000 Units Intravenous Q M,W,F-HD  ? feeding supplement (NEPRO CARB STEADY)  237 mL Oral TID BM  ? fentaNYL (SUBLIMAZE) injection  25 mcg Intravenous Once  ? folic acid  1 mg Oral Daily  ? furosemide  20 mg Oral Daily  ? heparin  5,000 Units Subcutaneous Q8H  ? insulin aspart  0-9 Units Subcutaneous Q6H  ? mouth rinse  15 mL Mouth Rinse q12n4p  ? metoprolol tartrate  25 mg Oral BID  ? multivitamin  1 tablet Oral QHS  ? polyethylene glycol  17 g Oral Daily  ? ?sodium chloride, sodium chloride, acetaminophen, alteplase, docusate sodium, fentaNYL, heparin, lidocaine (PF), lidocaine-prilocaine, midazolam, midazolam, pentafluoroprop-tetrafluoroeth, polyethylene glycol, simethicone ? ?Assessment/ Plan:  ?[name] is a 72 y.o.  female  with past medical histories including hypertension, CAD, CVA, and recently diagnosed end-stage renal disease with during hemodialysis.  Patient presents to the  emergency department with facial swelling including mouth and tongue.  Patient has been admitted for Angioedema [T78.3XXA] ? ?[facility]/MWF/right PermCath ? ?End-stage renal disease with hyperkalemia

## 2022-01-19 NOTE — Progress Notes (Signed)
?   01/19/22 1000  ?Clinical Encounter Type  ?Visited With Patient  ?Visit Type Initial  ?Referral From Nurse  ? ?Chaplain helped facilitate completion of HCPOA. ?

## 2022-01-19 NOTE — TOC Progression Note (Signed)
Transition of Care (TOC) - Progression Note  ? ? ?Patient Details  ?Name: Deanna [name] ?MRN: [id number] ?Date of Birth: [birth year] ? ?Transition of Care (TOC) CM/SW Contact  ?Shelbie Hutching, RN ?Phone Number: ?01/19/2022, 10:41 AM ? ?Clinical Narrative:    ? ?RNCM met with patient at the bedside this morning, no family is present.  She has signed the [place] forms declaring [name] English as her HCPOA. ?She will be staying with [name], patient reports that when she got sick her children didn't want to have anything to do with it, they didn't have time to help her but [name] stepped up and has been there for her. ?She reports she will not be going to [facility] or to SNF, she wants to go home with home health services.  [facility] has the referral for RN, PT, and OT.  Spoke with Brandi with [facility] and she will check their availability for this weekend.   ?Patient will get dialysis tomorrow.  ? ?Expected Discharge Plan: [place] ?Barriers to Discharge: Continued Medical Work up ? ?Expected Discharge Plan and Services ?Expected Discharge Plan: [place] ?  ?Discharge Planning Services: CM Consult ?Post Acute Care Choice: Home Health, Resumption of Svcs/PTA Provider ?Living arrangements for the past 2 months: [place] ?                ?DME Arranged: N/A ?DME Agency: NA ?  ?  ?  ?HH Arranged: RN, PT, OT ?[place] Agency: [place] ?Date HH Agency Contacted: 01/19/22 ?Time [place]: [id number] ?Representative spoke with at [place]: Velna Hatchet ? ? ?Social Determinants of Health (SDOH) Interventions ?  ? ?Readmission Risk Interventions ? ?  01/16/2022  ?  3:45 PM 12/29/2021  ?  2:33 PM  ?Readmission Risk Prevention Plan  ?Transportation Screening Complete Complete  ?PCP or Specialist Appt within 5-7 Days  Complete  ?Home Care Screening  Complete  ?Medication Review (RN CM)  Complete  ?Medication Review Press photographer) Complete   ?PCP or Specialist appointment  within 3-5 days of discharge Complete   ?[place] or Home Care Consult Complete   ?SW Recovery Care/Counseling Consult Complete   ?Palliative Care Screening Not Applicable   ?[place] Not Applicable   ? ? ?

## 2022-01-19 NOTE — Progress Notes (Signed)
Inpatient Diabetes Program Recommendations ? ?AACE/ADA: New Consensus Statement on Inpatient Glycemic Control (2015) ? ?Target Ranges:  Prepandial:   less than 140 mg/dL ?     Peak postprandial:   less than 180 mg/dL (1-2 hours) ?     Critically ill patients:  140 - 180 mg/dL  ? ?Lab Results  ?Component Value Date  ? GLUCAP 247 (H) 01/19/2022  ? HGBA1C 5.5 12/27/2021  ? ? ?Review of Glycemic Control ? Latest Reference Range & Units 01/19/22 05:51 01/19/22 11:25  ?Glucose-Capillary 70 - 99 mg/dL 215 (H) 247 (H)  ?(H): Data is abnormally high ? ? ?Inpatient Diabetes Program Recommendations:   ? ?Might consider [place] 10 units QD ? ?Will continue to follow while inpatient. ? ?Thank you, ?Reche Dixon, MSN, RN ?Diabetes Coordinator ?Inpatient Diabetes Program ?[phone] (team pager from 8a-5p) ? ? ? ?

## 2022-01-19 NOTE — Discharge Summary (Addendum)
?Physician Discharge Summary ?  ?PatientShanie [name] MRN: [id number] DOB: Apr 28, 1951  ?Admit date:     01/14/2022  ?Discharge date: 01/19/22  ?Discharge Physician: Jennye Boroughs  ? ?[facility] [place]  ? ?Recommendations at discharge:  ? ? ?Follow-up with PCP in 1 week ? ?2.   Go to [facility] outpatient hemodialysis center on Mondays, Wednesdays and Fridays for hemodialysis ? ?Discharge Diagnoses: ?Principal Problem: ?  Angioedema ?Active Problems: ?  Acute respiratory failure with hypoxia and hypercapnia (HCC) ? ? ? ?Hospital Course: ? ?Deanna [name] is a 71 y.o. 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  She had taken lisinopril and or "new cardiac med".  She had taken Benadryl at home without any relief.  EMS was called and she was given IM epinephrine prior to coming to the hospital. Chart review showed that she saw Dr. Baxter Hire on 10/28/2021 and she was advised to discontinue lisinopril and wait for 36 hours before starting Entresto.  However, patient said she took both lisinopril and Entresto. ?  ?  ?She was found to have significant tongue swelling, so much that she bit her tongue.  She was admitted to the hospital for angioedema likely from lisinopril or Entresto.  She was taken to the OR emergently and was intubated by ENT physician, Dr. Tami Ribas.  She was placed on mechanical ventilation for acute hypoxic respiratory failure.  She was treated with IV steroids, Pepcid and Benadryl. ? ?She was seen by the nephrologist for hemodialysis.  She was successfully extubated and transferred to [facility] hospitalist team on 01/18/2022.  Her condition continued to improve and she was deemed stable for discharge to home today.  She has been advised on ACE inhibitors  (including lisinopril) and Entresto.  ?  ?  ? ? ? ?  ? ? ?Consultants: Nephrologist, intensivist, otolaryngologist ?Procedures performed: Intubation and mechanical ventilation, left femoral central line ?Disposition: Home ?Diet recommendation:  ?Discharge Diet Orders (From admission, onward)  ? ?  Start     Ordered  ? 01/19/22 0000  Diet - low sodium heart healthy       ? 01/19/22 1332  ? ?  ?  ? ?  ? ?Cardiac and Carb modified diet ?DISCHARGE MEDICATION: ?Allergies as of 01/19/2022   ? ?   Reactions  ? Ace Inhibitors Swelling  ? Entresto [sacubitril-valsartan] Swelling  ? Patient said she took both lisinopril and Entresto so it is not clear which one she actually reacted to.  For safety reasons, she has been advised to avoid both ACE inhibitors and Entresto.  ? Lisinopril Swelling  ? Severe Angioedema (requiring Nasotracheal intubation)  ? [place]ato   ? Penicillins Rash  ? ?  ? ?  ?Medication List  ?  ? ?STOP taking these medications   ? ?Entresto 24-26 MG ?Generic drug: sacubitril-valsartan ?  ?hydrochlorothiazide 12.5 MG tablet ?Commonly known as: HYDRODIURIL ?  ? ?  ? ?TAKE these medications   ? ?aspirin EC 81 MG tablet ?Take 81 mg by mouth daily. ?Notes to patient: Take as you normally do ?  ?atorvastatin 40 MG tablet ?Commonly known as: LIPITOR ?Take 40 mg by mouth daily. ?Notes to patient: Take as you normally do ?  ?clopidogrel 75 MG tablet ?Commonly known as: PLAVIX ?Take 1 tablet (75 mg  total) by mouth daily for 7 days. ?Notes to patient: Continue taking if you have not finished your 7 days worth ?  ?ferrous sulfate 325 (65 FE) MG EC tablet ?Take 325 mg by mouth in the morning. ?Notes to patient: Take as you normally do ?  ?furosemide 20 MG tablet ?Commonly known as: LASIX ?Take 20 mg by mouth daily. ?Notes to patient: Dose given 01/19/2022, do not have to take again until 01/20/2022 ?  ?gabapentin 300 MG capsule ?Commonly known as: NEURONTIN ?Take 300 mg by mouth 3 (three) times daily. ?  ?insulin aspart 100  UNIT/ML injection ?Commonly known as: novoLOG ?Inject 15-25 Units into the skin 3 (three) times daily before meals. 15 units Brk ?20 units noon ?25 units hs ?Notes to patient: Take as you normally do.  ?  ?Lantus SoloStar 100 UNIT/ML Solostar Pen ?Generic drug: insulin glargine ?Inject 65 Units into the skin at bedtime. ?Notes to patient: Take as you normally do ?  ?metoprolol tartrate 25 MG tablet ?Commonly known as: LOPRESSOR ?Take 1 tablet (25 mg total) by mouth 2 (two) times daily. ?Notes to patient: Morning dose given 01/19/2022, take evening dose as you normally do once you get home from the hospital. ?  ?Vitamin D (Ergocalciferol) 1.25 MG (50000 UNIT) Caps capsule ?Commonly known as: DRISDOL ?Take 50,000 Units by mouth once a week. ?Notes to patient: Take as you normally do ?  ? ?  ? ? ?Discharge Exam: ?Filed Weights  ? 01/18/22 0500 01/18/22 1707 01/19/22 0500  ?Weight: 90.3 kg 84 kg 82.3 kg  ? ?GEN: NAD ?SKIN: No rash ?EYES: EOMI ?ENT: MMM, no facial or tongue swelling ?CV: RRR ?PULM: CTA B ?ABD: soft, ND, NT, +BS ?CNS: AAO x 3, non focal ?EXT: No edema or tenderness ? ? ?Condition at discharge: good ? ?The results of significant diagnostics from this hospitalization (including imaging, microbiology, ancillary and laboratory) are listed below for reference.  ? ?Imaging Studies: ?CT HEAD WO CONTRAST ? ?Result Date: 12/26/2021 ?CLINICAL DATA:  Intermittent numbness, weakness, and dizziness. Unable to lift right arm. EXAM: CT HEAD WITHOUT CONTRAST TECHNIQUE: Contiguous axial images were obtained from the base of the skull through the vertex without intravenous contrast. RADIATION DOSE REDUCTION: This exam was performed according to the departmental dose-optimization program which includes automated exposure control, adjustment of the mA and/or kV according to patient size and/or use of iterative reconstruction technique. COMPARISON:  None. FINDINGS: Brain: No evidence of acute infarction, hemorrhage,  hydrocephalus, extra-axial collection or mass lesion/mass effect. Cerebral volume within normal limits for age. Scattered mild periventricular and subcortical white matter hypodensities are nonspecific, but favored to reflect chronic microvascular ischemic changes. Vascular: Atherosclerotic vascular calcification of the carotid siphons. No hyperdense vessel. Skull: Normal. Negative for fracture or focal lesion. Sinuses/Orbits: No acute finding. Scattered mild paranasal sinus mucosal thickening. The orbits are unremarkable. Other: None. IMPRESSION: 1. No acute intracranial abnormality. 2. Mild chronic microvascular ischemic changes. Electronically Signed   By: Titus Dubin M.D.   On: 12/26/2021 16:52  ? ?MR ANGIO HEAD WO CONTRAST ? ?Result Date: 12/28/2021 ?CLINICAL DATA:  Stroke follow-up EXAM: MRA HEAD WITHOUT CONTRAST TECHNIQUE: Angiographic images of the Circle of Willis were acquired using MRA technique without intravenous contrast. COMPARISON:  No pertinent prior exam. FINDINGS: POSTERIOR CIRCULATION: --Vertebral arteries: Normal --Inferior cerebellar arteries: Normal. --Basilar artery: Normal. --Superior cerebellar arteries: Normal. --Posterior cerebral arteries: Normal. ANTERIOR CIRCULATION: --Intracranial internal carotid arteries: Normal. --Anterior cerebral arteries (ACA): Normal. --Middle cerebral arteries (MCA): Normal. ANATOMIC  VARIANTS: None Other: None. IMPRESSION: Normal intracranial MRA. Electronically Signed   By: Ulyses Jarred M.D.   On: 12/28/2021 02:10  ? ?MR Brain Wo Contrast (neuro protocol) ? ?Result Date: 12/26/2021 ?CLINICAL DATA:  Acute neurologic deficit EXAM: MRI HEAD WITHOUT CONTRAST TECHNIQUE: Multiplanar, multiecho pulse sequences of the brain and surrounding structures were obtained without intravenous contrast. COMPARISON:  None. FINDINGS: Brain: Punctate focus of reduced diffusion within the superior right pericallosal white matter. No other diffusion abnormality. No acute or  chronic hemorrhage. Hyperintense T2-weighted signal is moderately widespread throughout the white matter. Generalized volume loss without a clear lobar predilection. The midline structures are normal. Vascular: Major flow voids a

## 2022-01-19 NOTE — Progress Notes (Signed)
Patient discharged. [name] asked for phone instructions rather than coming up to the unit, so reviewed full discharge instructions with her via phone and with patient at bedside. Both [name] and [name] able to verbalize discharge instructions including setting up follow up appointments. Patient's only intact belongings at bedside were her jeans, her nightgown had been cut off and she authorized this RN to throw away those remains.  ? ?Phone number for [facility] in [place] printed and provided with discharge papers per [name]'s request to be able to call and confirm dialysis appointments for [name]. Also sent with patient were her HCPOA papers.  ?

## 2022-01-19 NOTE — Progress Notes (Signed)
Brief discharge instructions reviewed with Deanna [name] at bedside and earlier with her HCPOA [name] when alerting [name] to discharge. When [name] arrives plan is for full review of instructions. Patient able to transfer with minimal to no assistance to bedside commode using a walker which in discussion with family is her level of mobility at home. ?

## 2022-01-20 ENCOUNTER — Other Ambulatory Visit: Payer: Self-pay

## 2022-01-20 ENCOUNTER — Inpatient Hospital Stay
Admission: EM | Admit: 2022-01-20 | Discharge: 2022-01-27 | DRG: 871 | Disposition: A | Discharge status: Home Health | Payer: Medicare HMO | Attending: Internal Medicine | Admitting: Internal Medicine

## 2022-01-20 ENCOUNTER — Emergency Department: Payer: Medicare HMO

## 2022-01-20 DIAGNOSIS — R6520 Severe sepsis without septic shock: Secondary | ICD-10-CM | POA: Diagnosis present

## 2022-01-20 DIAGNOSIS — I2510 Atherosclerotic heart disease of native coronary artery without angina pectoris: Secondary | ICD-10-CM | POA: Diagnosis present

## 2022-01-20 DIAGNOSIS — N186 End stage renal disease: Secondary | ICD-10-CM | POA: Diagnosis present

## 2022-01-20 DIAGNOSIS — Z7902 Long term (current) use of antithrombotics/antiplatelets: Secondary | ICD-10-CM

## 2022-01-20 DIAGNOSIS — J9601 Acute respiratory failure with hypoxia: Principal | ICD-10-CM

## 2022-01-20 DIAGNOSIS — I5043 Acute on chronic combined systolic (congestive) and diastolic (congestive) heart failure: Secondary | ICD-10-CM | POA: Diagnosis present

## 2022-01-20 DIAGNOSIS — J189 Pneumonia, unspecified organism: Secondary | ICD-10-CM | POA: Diagnosis present

## 2022-01-20 DIAGNOSIS — Z992 Dependence on renal dialysis: Secondary | ICD-10-CM

## 2022-01-20 DIAGNOSIS — Z888 Allergy status to other drugs, medicaments and biological substances status: Secondary | ICD-10-CM | POA: Diagnosis not present

## 2022-01-20 DIAGNOSIS — E1122 Type 2 diabetes mellitus with diabetic chronic kidney disease: Secondary | ICD-10-CM | POA: Diagnosis present

## 2022-01-20 DIAGNOSIS — D631 Anemia in chronic kidney disease: Secondary | ICD-10-CM | POA: Diagnosis present

## 2022-01-20 DIAGNOSIS — I10 Essential (primary) hypertension: Secondary | ICD-10-CM | POA: Diagnosis not present

## 2022-01-20 DIAGNOSIS — Z7982 Long term (current) use of aspirin: Secondary | ICD-10-CM

## 2022-01-20 DIAGNOSIS — I132 Hypertensive heart and chronic kidney disease with heart failure and with stage 5 chronic kidney disease, or end stage renal disease: Secondary | ICD-10-CM | POA: Diagnosis present

## 2022-01-20 DIAGNOSIS — Z20822 Contact with and (suspected) exposure to covid-19: Secondary | ICD-10-CM | POA: Diagnosis present

## 2022-01-20 DIAGNOSIS — E1169 Type 2 diabetes mellitus with other specified complication: Secondary | ICD-10-CM

## 2022-01-20 DIAGNOSIS — A419 Sepsis, unspecified organism: Secondary | ICD-10-CM | POA: Diagnosis present

## 2022-01-20 DIAGNOSIS — Z8673 Personal history of transient ischemic attack (TIA), and cerebral infarction without residual deficits: Secondary | ICD-10-CM | POA: Diagnosis not present

## 2022-01-20 DIAGNOSIS — N2581 Secondary hyperparathyroidism of renal origin: Secondary | ICD-10-CM | POA: Diagnosis present

## 2022-01-20 DIAGNOSIS — Z794 Long term (current) use of insulin: Secondary | ICD-10-CM

## 2022-01-20 DIAGNOSIS — Z91018 Allergy to other foods: Secondary | ICD-10-CM

## 2022-01-20 DIAGNOSIS — K2289 Other specified disease of esophagus: Secondary | ICD-10-CM | POA: Diagnosis present

## 2022-01-20 DIAGNOSIS — R079 Chest pain, unspecified: Secondary | ICD-10-CM | POA: Diagnosis present

## 2022-01-20 DIAGNOSIS — J690 Pneumonitis due to inhalation of food and vomit: Secondary | ICD-10-CM | POA: Diagnosis present

## 2022-01-20 DIAGNOSIS — E11649 Type 2 diabetes mellitus with hypoglycemia without coma: Secondary | ICD-10-CM | POA: Diagnosis present

## 2022-01-20 DIAGNOSIS — E785 Hyperlipidemia, unspecified: Secondary | ICD-10-CM | POA: Diagnosis present

## 2022-01-20 DIAGNOSIS — I5020 Unspecified systolic (congestive) heart failure: Secondary | ICD-10-CM | POA: Diagnosis not present

## 2022-01-20 DIAGNOSIS — I248 Other forms of acute ischemic heart disease: Secondary | ICD-10-CM | POA: Diagnosis present

## 2022-01-20 DIAGNOSIS — Z79899 Other long term (current) drug therapy: Secondary | ICD-10-CM

## 2022-01-20 DIAGNOSIS — R778 Other specified abnormalities of plasma proteins: Secondary | ICD-10-CM | POA: Diagnosis present

## 2022-01-20 DIAGNOSIS — R0602 Shortness of breath: Secondary | ICD-10-CM | POA: Diagnosis present

## 2022-01-20 DIAGNOSIS — D649 Anemia, unspecified: Secondary | ICD-10-CM | POA: Diagnosis present

## 2022-01-20 DIAGNOSIS — Z880 Allergy status to penicillin: Secondary | ICD-10-CM

## 2022-01-20 DIAGNOSIS — E119 Type 2 diabetes mellitus without complications: Secondary | ICD-10-CM

## 2022-01-20 DIAGNOSIS — R7989 Other specified abnormal findings of blood chemistry: Secondary | ICD-10-CM | POA: Diagnosis present

## 2022-01-20 HISTORY — DX: Cerebral infarction, unspecified: I63.9

## 2022-01-20 HISTORY — DX: Type 2 diabetes mellitus without complications: E11.9

## 2022-01-20 LAB — COMPREHENSIVE METABOLIC PANEL
ALT: 81 U/L — ABNORMAL HIGH (ref 0–44)
AST: 69 U/L — ABNORMAL HIGH (ref 15–41)
Albumin: 2.9 g/dL — ABNORMAL LOW (ref 3.5–5.0)
Alkaline Phosphatase: 394 U/L — ABNORMAL HIGH (ref 38–126)
Anion gap: 17 — ABNORMAL HIGH (ref 5–15)
BUN: 50 mg/dL — ABNORMAL HIGH (ref 8–23)
CO2: 21 mmol/L — ABNORMAL LOW (ref 22–32)
Calcium: 8.8 mg/dL — ABNORMAL LOW (ref 8.9–10.3)
Chloride: 101 mmol/L (ref 98–111)
Creatinine, Ser: 3.46 mg/dL — ABNORMAL HIGH (ref 0.44–1.00)
GFR, Estimated: 14 mL/min — ABNORMAL LOW (ref >60)
Glucose, Bld: 120 mg/dL — ABNORMAL HIGH (ref 70–99)
Potassium: 4.7 mmol/L (ref 3.5–5.1)
Sodium: 139 mmol/L (ref 135–145)
Total Bilirubin: 0.9 mg/dL (ref 0.3–1.2)
Total Protein: 7.3 g/dL (ref 6.5–8.1)

## 2022-01-20 LAB — CBC WITH DIFFERENTIAL/PLATELET
Abs Immature Granulocytes: 0.71 10*3/uL — ABNORMAL HIGH (ref 0.00–0.07)
Basophils Absolute: 0.1 10*3/uL (ref 0.0–0.1)
Basophils Relative: 0 %
Eosinophils Absolute: 0.2 10*3/uL (ref 0.0–0.5)
Eosinophils Relative: 1 %
HCT: 37.8 % (ref 36.0–46.0)
Hemoglobin: 11.1 g/dL — ABNORMAL LOW (ref 12.0–15.0)
Immature Granulocytes: 3 %
Lymphocytes Relative: 7 %
Lymphs Abs: 1.6 10*3/uL (ref 0.7–4.0)
MCH: 27 pg (ref 26.0–34.0)
MCHC: 29.4 g/dL — ABNORMAL LOW (ref 30.0–36.0)
MCV: 92 fL (ref 80.0–100.0)
Monocytes Absolute: 1.4 10*3/uL — ABNORMAL HIGH (ref 0.1–1.0)
Monocytes Relative: 6 %
Neutro Abs: 19.2 10*3/uL — ABNORMAL HIGH (ref 1.7–7.7)
Neutrophils Relative %: 83 %
Platelets: 266 10*3/uL (ref 150–400)
RBC: 4.11 MIL/uL (ref 3.87–5.11)
RDW: 15.9 % — ABNORMAL HIGH (ref 11.5–15.5)
WBC: 23.2 10*3/uL — ABNORMAL HIGH (ref 4.0–10.5)
nRBC: 0.2 % (ref 0.0–0.2)

## 2022-01-20 LAB — PROCALCITONIN: Procalcitonin: 3.59 ng/mL

## 2022-01-20 LAB — TROPONIN I (HIGH SENSITIVITY)
Troponin I (High Sensitivity): 94 ng/L — ABNORMAL HIGH (ref <18)
Troponin I (High Sensitivity): 229 ng/L (ref <18)

## 2022-01-20 LAB — LACTIC ACID, PLASMA
Lactic Acid, Venous: 4.3 mmol/L (ref 0.5–1.9)
Lactic Acid, Venous: 1.7 mmol/L (ref 0.5–1.9)

## 2022-01-20 LAB — CBG MONITORING, ED
Glucose-Capillary: 120 mg/dL — ABNORMAL HIGH (ref 70–99)
Glucose-Capillary: <10 mg/dL — CL (ref 70–99)
Glucose-Capillary: 18 mg/dL — CL (ref 70–99)
Glucose-Capillary: 79 mg/dL (ref 70–99)

## 2022-01-20 LAB — RESP PANEL BY RT-PCR (FLU A&B, COVID) ARPGX2
Influenza A by PCR: NEGATIVE
Influenza B by PCR: NEGATIVE
SARS Coronavirus 2 by RT PCR: NEGATIVE

## 2022-01-20 LAB — BRAIN NATRIURETIC PEPTIDE: B Natriuretic Peptide: 4393.1 pg/mL — ABNORMAL HIGH (ref 0.0–100.0)

## 2022-01-20 LAB — APTT: aPTT: 21 seconds — ABNORMAL LOW (ref 24–36)

## 2022-01-20 LAB — PROTIME-INR
INR: 1.1 (ref 0.8–1.2)
Prothrombin Time: 13.6 seconds (ref 11.4–15.2)

## 2022-01-20 MED ORDER — LACTATED RINGERS IV SOLN: INTRAVENOUS | Status: DC

## 2022-01-20 MED ORDER — GABAPENTIN 300 MG PO CAPS
300.0000 mg | ORAL_CAPSULE | Freq: Three times a day (TID) | ORAL | Status: DC
  Administered 2022-01-21: 300 mg via ORAL
  Filled 2022-01-20: qty 1

## 2022-01-20 MED ORDER — VECURONIUM BROMIDE 10 MG IV SOLR
INTRAVENOUS | Status: AC
  Filled 2022-01-20: qty 10

## 2022-01-20 MED ORDER — DEXTROSE 50 % IV SOLN
INTRAVENOUS | Status: AC
  Administered 2022-01-20: 25 g via INTRAVENOUS
  Filled 2022-01-20: qty 50

## 2022-01-20 MED ORDER — DEXTROSE 50 % IV SOLN: 25.0000 g | Freq: Once | INTRAVENOUS | Status: AC

## 2022-01-20 MED ORDER — HEPARIN SODIUM (PORCINE) 5000 UNIT/ML IJ SOLN: 5000.0000 [IU] | Freq: Three times a day (TID) | INTRAMUSCULAR | Status: DC

## 2022-01-20 MED ORDER — LACTATED RINGERS IV BOLUS (SEPSIS)
500.0000 mL | Freq: Once | INTRAVENOUS | Status: AC
  Administered 2022-01-20: 500 mL via INTRAVENOUS

## 2022-01-20 MED ORDER — DEXTROSE 50 % IV SOLN
INTRAVENOUS | Status: AC
  Filled 2022-01-20: qty 50

## 2022-01-20 MED ORDER — CLOPIDOGREL BISULFATE 75 MG PO TABS
75.0000 mg | ORAL_TABLET | Freq: Every day | ORAL | Status: DC
  Administered 2022-01-22 – 2022-01-27 (×5): 75 mg via ORAL
  Filled 2022-01-20 (×6): qty 1

## 2022-01-20 MED ORDER — ATORVASTATIN CALCIUM 20 MG PO TABS
40.0000 mg | ORAL_TABLET | Freq: Every day | ORAL | Status: DC
  Administered 2022-01-22 – 2022-01-27 (×6): 40 mg via ORAL
  Filled 2022-01-20 (×6): qty 2

## 2022-01-20 MED ORDER — VANCOMYCIN HCL IN DEXTROSE 1-5 GM/200ML-% IV SOLN
1000.0000 mg | Freq: Once | INTRAVENOUS | Status: AC
  Administered 2022-01-20: 1000 mg via INTRAVENOUS
  Filled 2022-01-20: qty 200

## 2022-01-20 MED ORDER — CHLORHEXIDINE GLUCONATE CLOTH 2 % EX PADS
6.0000 | MEDICATED_PAD | Freq: Every day | CUTANEOUS | Status: DC
  Administered 2022-01-22 – 2022-01-27 (×6): 6 via TOPICAL
  Filled 2022-01-20: qty 6

## 2022-01-20 MED ORDER — FENTANYL CITRATE PF 50 MCG/ML IJ SOSY
PREFILLED_SYRINGE | INTRAMUSCULAR | Status: DC
Start: 2022-01-20 — End: 2022-01-20
  Filled 2022-01-20: qty 2

## 2022-01-20 MED ORDER — VANCOMYCIN HCL IN DEXTROSE 1-5 GM/200ML-% IV SOLN
1000.0000 mg | INTRAVENOUS | Status: DC
Start: 2022-01-23 — End: 2022-01-22

## 2022-01-20 MED ORDER — SODIUM CHLORIDE 0.9 % IV SOLN
2.0000 g | Freq: Once | INTRAVENOUS | Status: AC
  Administered 2022-01-20: 2 g via INTRAVENOUS
  Filled 2022-01-20: qty 12.5

## 2022-01-20 MED ORDER — DEXTROSE 10 % IV SOLN: INTRAVENOUS | Status: DC

## 2022-01-20 MED ORDER — VANCOMYCIN HCL IN DEXTROSE 1-5 GM/200ML-% IV SOLN: 1000.0000 mg | Freq: Once | INTRAVENOUS | Status: DC

## 2022-01-20 MED ORDER — ACETAMINOPHEN 325 MG PO TABS
650.0000 mg | ORAL_TABLET | Freq: Once | ORAL | Status: AC
  Administered 2022-01-20: 650 mg via ORAL
  Filled 2022-01-20: qty 2

## 2022-01-20 MED ORDER — METOPROLOL TARTRATE 25 MG PO TABS
25.0000 mg | ORAL_TABLET | Freq: Two times a day (BID) | ORAL | Status: DC
  Administered 2022-01-21 – 2022-01-27 (×11): 25 mg via ORAL
  Filled 2022-01-20 (×12): qty 1

## 2022-01-20 MED ORDER — MIDAZOLAM HCL 2 MG/2ML IJ SOLN
INTRAMUSCULAR | Status: AC
  Filled 2022-01-20: qty 4

## 2022-01-20 MED ORDER — FERROUS SULFATE 325 (65 FE) MG PO TABS
325.0000 mg | ORAL_TABLET | Freq: Every morning | ORAL | Status: DC
  Administered 2022-01-22 – 2022-01-27 (×6): 325 mg via ORAL
  Filled 2022-01-20 (×6): qty 1

## 2022-01-20 MED ORDER — ASPIRIN EC 81 MG PO TBEC
81.0000 mg | DELAYED_RELEASE_TABLET | Freq: Every day | ORAL | Status: DC
  Administered 2022-01-22 – 2022-01-27 (×5): 81 mg via ORAL
  Filled 2022-01-20 (×6): qty 1

## 2022-01-20 MED ORDER — VANCOMYCIN HCL 750 MG/150ML IV SOLN
750.0000 mg | Freq: Once | INTRAVENOUS | Status: AC
  Administered 2022-01-21: 750 mg via INTRAVENOUS
  Filled 2022-01-20 (×2): qty 150

## 2022-01-20 MED ORDER — SODIUM CHLORIDE 0.9 % IV SOLN
1.0000 g | INTRAVENOUS | Status: DC
  Administered 2022-01-21: 1 g via INTRAVENOUS
  Filled 2022-01-20: qty 10

## 2022-01-20 MED ORDER — SODIUM CHLORIDE 0.9% FLUSH
3.0000 mL | Freq: Two times a day (BID) | INTRAVENOUS | Status: DC
  Administered 2022-01-21 – 2022-01-27 (×12): 3 mL via INTRAVENOUS

## 2022-01-20 MED ORDER — ETOMIDATE 2 MG/ML IV SOLN
INTRAVENOUS | Status: AC
  Filled 2022-01-20: qty 20

## 2022-01-20 NOTE — ED Triage Notes (Signed)
Per EMS multiple complaints. Today complain of generalized weakness and SOB. Recent d/c with new DM, stroke, and dialysis. ? ?BP 188/118 ?HR 130's ?CBG 238 ?Temp 97.8 ?RA 88%-now on 6L 97% ? ?MWF dialysis did not go today.  ?

## 2022-01-20 NOTE — H&P (Signed)
?History and Physical  ? ? ?PatientKemya [name] PPJ:[id number] DOB: 09-29-1950 ?DOA: 01/20/2022 ?DOS: the patient was seen and examined on 01/20/2022 ?PCP: [facility] [place]  ?Patient coming from: Home ? ?Chief Complaint:  ?Chief Complaint  ?Patient presents with  ? Shortness of Breath  ? ?HPI: Deanna [name] is a 71 y.o. female with medical history significant of heart disease, diabetes mellitus type 2, hypertension, history of stroke, end-stage renal disease on hemodialysis heart failure with reduced ejection fraction, angioedema coming to [place] with shortness of breath that started earlier today with generalized weakness and fever.  Patient on initial evaluation in the emergency room meet sepsis criteria with unclear source of infection.  Patient was given IV fluids and IV antibiotics. ?Admission was requested to medicine for sepsis and shortness of breath. ?Chart review showed that patient has been hospitalized throughout this month multiple times all throughout the hospital with an extended stay.  Patient initially was admitted earlier in the beginning of the month for numbness affecting the right arm where she was admitted for stroke evaluation.  Patient was critically ill with renal failure and hyperkalemia plan for immediate dialysis with placement of a temporary dialysis catheter by vascular MD.  On 415 patient had a rapid response where she was transferred to the ICU for respiratory distress.  Patient was seen by pulmonologist during that stay along with cardiologist.  Patient also had lobar pneumonia parainfluenza virus and treated with 5 days of antibiotic therapy per discharge summary on 01/13/2022.  On 4/20-second/2023 patient seen in the emergency room with angioedema with mild swelling and ?Seen by ED doctor Dr. Archie Balboa and ICU service admitted patient as patient needed emergent nasotracheal intubation due to ACE inhibitor induced angioedema.  She was then discharged yesterday  after her angioedema admission as her angioedema resolved and patient stabilized.  Patient returns again today with shortness of breath fevers and sepsis. ?Upon my initial evaluation patient was very lethargic at which time I discussed with ED provider and ICU provider Ms. pertinently they both evaluated patient at that time was diaphoretic and lethargic and found that patient needed to be intubated and further found that patient was hypoglycemic and mental status improved as did the oxygenation patient was able to protect her airway was responding after D50 was given.  And after patient is stabilized patient was requested to admit to medicine service. ?Also informed Dr. Zollie Scale about patient's admission and need for hemodialysis possibly tonight due to her respiratory distress which later stabilized on 4 L. ?We will admit and consult nephrology. ? ?Review of Systems: Review of Systems  ?Unable to perform ROS: Acuity of condition  ?Respiratory:  Positive for shortness of breath.   ? ?Past Medical History:  ?Diagnosis Date  ? Coronary artery disease   ? Diabetes mellitus without complication ([place])   ? Hypertension   ? Stroke [facility])   ? ?Past Surgical History:  ?Procedure Laterality Date  ? CARDIAC CATHETERIZATION Left 03/01/2016  ? Procedure: Left Heart Cath and Coronary Angiography;  Surgeon: Teodoro Spray, MD;  Location: [place] [facility];  Service: Cardiovascular;  Laterality: Left;  ? DIALYSIS/PERMA CATHETER INSERTION Right 01/10/2022  ? Procedure: DIALYSIS/PERMA CATHETER INSERTION;  Surgeon: Evaristo Bury, MD;  Location: [place] [facility];  Service: Cardiovascular;  Laterality: Right;  ? INTUBATION-ENDOTRACHEAL WITH TRACHEOSTOMY STANDBY Left 01/14/2022  ? Procedure: INTUBATION-NASAL WITH TRACHEOSTOMY STANDBY;  Surgeon: Beverly Gust, MD;  Location: [facility];  Service: ENT;  Laterality: Left;  ?  TEMPORARY DIALYSIS CATHETER N/A 01/04/2022  ? Procedure: TEMPORARY DIALYSIS CATHETER;  Surgeon: Algernon Huxley, MD;   Location: [place] [facility];  Service: Cardiovascular;  Laterality: N/A;  ? ?Social History:  reports that she has never smoked. She does not have any smokeless tobacco history on file. She reports that she does not drink alcohol and does not use drugs. ? ?Allergies  ?Allergen Reactions  ? Ace Inhibitors Swelling  ? Entresto [Sacubitril-Valsartan] Swelling  ?  Patient said she took both lisinopril and Entresto so it is not clear which one she actually reacted to.  For safety reasons, she has been advised to avoid both ACE inhibitors and Entresto.  ? Lisinopril Swelling  ?  Severe Angioedema (requiring Nasotracheal intubation)  ? Tomato   ? Penicillins Rash  ? ? ?History reviewed. No pertinent family history. ? ?Prior to Admission medications   ?Medication Sig Start Date End Date Taking? Authorizing Provider  ?aspirin EC 81 MG tablet Take 81 mg by mouth daily.    [provider]  ?atorvastatin (LIPITOR) 40 MG tablet Take 40 mg by mouth daily. ?Patient not taking: Reported on 01/18/2022    [provider]  ?clopidogrel (PLAVIX) 75 MG tablet Take 1 tablet (75 mg total) by mouth daily for 7 days. 01/13/22 01/20/22  Caren Griffins, MD  ?ferrous sulfate 325 (65 FE) MG EC tablet Take 325 mg by mouth in the morning. 08/16/21 08/16/22  [provider]  ?furosemide (LASIX) 20 MG tablet Take 20 mg by mouth daily. 10/28/21   [provider]  ?gabapentin (NEURONTIN) 300 MG capsule Take 300 mg by mouth 3 (three) times daily. 08/24/21   [provider]  ?insulin aspart (NOVOLOG) 100 UNIT/ML injection Inject 15-25 Units into the skin 3 (three) times daily before meals. 15 units Brk ?20 units noon ?25 units hs    [provider]  ?insulin glargine (LANTUS SOLOSTAR) 100 UNIT/ML Solostar Pen Inject 65 Units into the skin at bedtime. 06/24/14   [provider]  ?metoprolol tartrate (LOPRESSOR) 25 MG tablet Take 1 tablet (25 mg total) by mouth 2 (two) times daily. 01/13/22  02/12/22  Caren Griffins, MD  ?Vitamin D, Ergocalciferol, (DRISDOL) 1.25 MG (50000 UNIT) CAPS capsule Take 50,000 Units by mouth once a week. 10/12/21   [provider]  ? ? ?Physical Exam: ?Vitals:  ? 01/20/22 2130 01/20/22 2200 01/20/22 2230 01/20/22 2300  ?BP: (!) 152/74 (!) 157/63 140/80 137/76  ?Pulse: (!) 104 99 94 89  ?Resp: (!) 30 (!) 25 (!) 22 (!) 25  ?Temp:  (!) 96.8 ?F (36 ?C)    ?TempSrc:  Rectal    ?SpO2: 100% 100% 99% 100%  ?Physical Exam ?Constitutional:   ?   General: She is in acute distress.  ?   Appearance: She is ill-appearing.  ?HENT:  ?   Head: Normocephalic and atraumatic.  ?   Right Ear: External ear normal.  ?   Left Ear: External ear normal.  ?Eyes:  ?   Pupils: Pupils are equal, round, and reactive to light.  ?Cardiovascular:  ?   Rate and Rhythm: Normal rate and regular rhythm.  ?   Pulses: Normal pulses.  ?   Heart sounds: Normal heart sounds.  ?Pulmonary:  ?   Effort: Pulmonary effort is normal.  ?   Breath sounds: Rales present.  ?Abdominal:  ?   General: Bowel sounds are normal. There is no distension.  ?   Palpations: Abdomen is soft.  ?  Tenderness: There is no abdominal tenderness. There is no guarding.  ?Musculoskeletal:  ?   Right lower leg: No edema.  ?   Left lower leg: No edema.  ?Neurological:  ?   General: No focal deficit present.  ? ? ?Data Reviewed: ?Results for orders placed or performed during the hospital encounter of 01/20/22 (from the past 24 hour(s))  ?Comprehensive metabolic panel     Status: Abnormal  ? Collection Time: 01/20/22  7:22 PM  ?Result Value Ref Range  ? Sodium 139 135 - 145 mmol/L  ? Potassium 4.7 3.5 - 5.1 mmol/L  ? Chloride 101 98 - 111 mmol/L  ? CO2 21 (L) 22 - 32 mmol/L  ? Glucose, Bld 120 (H) 70 - 99 mg/dL  ? BUN 50 (H) 8 - 23 mg/dL  ? Creatinine, Ser 3.46 (H) 0.44 - 1.00 mg/dL  ? Calcium 8.8 (L) 8.9 - 10.3 mg/dL  ? Total Protein 7.3 6.5 - 8.1 g/dL  ? Albumin 2.9 (L) 3.5 - 5.0 g/dL  ? AST 69 (H) 15 - 41 U/L  ? ALT 81 (H) 0 - 44 U/L  ?  Alkaline Phosphatase 394 (H) 38 - 126 U/L  ? Total Bilirubin 0.9 0.3 - 1.2 mg/dL  ? GFR, Estimated 14 (L) >60 mL/min  ? Anion gap 17 (H) 5 - 15  ?CBC with Differential     Status: Abnormal  ? Collection Ti

## 2022-01-20 NOTE — Progress Notes (Signed)
Patient well-known to [place] as she was just discharged from the hospital for an episode of severe angioedema that required intubation.  She comes in now with respiratory distress with probable pneumonia.  However BNP also elevated and patient on BiPAP.  Therefore we will proceed with a short hemodialysis treatment of 2 hours this evening.  Dialysis nurse has been notified. ?

## 2022-01-20 NOTE — Progress Notes (Signed)
Pharmacy Antibiotic Note ? ?Deanna [name] is a 71 y.o. female admitted on 01/20/2022 with pneumonia and sepsis.  Pharmacy has been consulted for Cefepime, Vancomycin dosing.  Pt is on HD Q MWF.  ? ?Plan: ?Cefepime 1 gm IV Q24H ordered to start on 4/28 @ 2300.  ? ?Vancomycin 1 gm IV X 1 given in ED on 4/28 @ 2018. ?Additional vanc 750 mg IV X 1 ordered to make total loading dose of 1750 mg.  ?Vancomycin 1 gm IV Q MWF-HD ordered to start on 5/1. ? ?  ? ?Temp (24hrs), Avg:98.8 ?F (37.1 ?C), Min:96.8 ?F (36 ?C), Max:100.8 ?F (38.2 ?C) ? ?Recent Labs  ?Lab 01/16/22 ?0350 01/17/22 ?2956 01/18/22 ?0500 01/19/22 ?0530 01/20/22 ?1922 01/20/22 ?2123  ?WBC 8.2 9.1 11.7* 9.4 23.2*  --   ?CREATININE 3.45* 2.70* 3.31* 2.32* 3.46*  --   ?LATICACIDVEN  --   --   --   --  4.3* 1.7  ?  ?Estimated Creatinine Clearance: 18 mL/min (A) (by C-G formula based on SCr of 3.46 mg/dL (H)).   ? ?Allergies  ?Allergen Reactions  ? Ace Inhibitors Swelling  ? Entresto [Sacubitril-Valsartan] Swelling  ?  Patient said she took both lisinopril and Entresto so it is not clear which one she actually reacted to.  For safety reasons, she has been advised to avoid both ACE inhibitors and Entresto.  ? Lisinopril Swelling  ?  Severe Angioedema (requiring Nasotracheal intubation)  ? Tomato   ? Penicillins Rash  ? ? ?Antimicrobials this admission: ?  >>  ?  >>  ? ?Dose adjustments this admission: ? ? ?Microbiology results: ? BCx:  ? UCx:   Sputum:   ? MRSA PCR:  ? ?Thank you for allowing pharmacy to be a part of this patient?s care. ? ?Daleon Willinger D ?01/20/2022 11:22 PM ? ?

## 2022-01-20 NOTE — Progress Notes (Signed)
PHARMACY -  BRIEF ANTIBIOTIC NOTE  ? ?Pharmacy has received consult(s) for cefepime and vancomycin from an ED provider.  The patient's profile has been reviewed for ht/wt/allergies/indication/available labs.   ? ?One time order(s) placed by MD for cefepime and vancomycin ? ?Further antibiotics/pharmacy consults should be ordered by admitting physician if indicated.       ?                ?Thank you, ?Ranika Mcniel A ?01/20/2022  7:47 PM ? ?

## 2022-01-20 NOTE — ED Notes (Signed)
Blood sugar reading "lo" on glucometer, order for dextrose received.  ?

## 2022-01-20 NOTE — ED Notes (Signed)
Pt caregiver called and updated. [place] @ [phone] ?

## 2022-01-20 NOTE — Progress Notes (Signed)
CODE SEPSIS - PHARMACY COMMUNICATION ? ?**Broad Spectrum Antibiotics should be administered within 1 hour of Sepsis diagnosis** ? ?Time Code Sepsis Called/Page Received: 1898 ? ?Antibiotics Ordered: cefepime, vanc ? ?Time of 1st antibiotic administration: 1946 ? ?Additional action taken by pharmacy:   ? ?If necessary, Name of Provider/Nurse Contacted:    ? ? ? ?Noralee Space ,PharmD ?Clinical Pharmacist  ?01/20/2022  8:39 PM  ?

## 2022-01-20 NOTE — Sepsis Progress Note (Signed)
Following for sepsis monitoring ?

## 2022-01-20 NOTE — Op Note (Signed)
01/20/2022 ? ?9:00 AM ? ? ? ?[name], [name] ? ?[id number] ? ? ?Pre-Op Dx: angio edema ? ?Post-op Dx: SAME ? ?Proc: Procedure date 01/14/2022 procedure performed emergent endoscopic transnasal intubation ? ?Surg:  Deanna [name] ? ?Anes:  GOT ? ?EBL: 0 ? ?Comp: None ? ?Findings: Excessive swelling anterior tongue and floor mouth epiglottis and larynx within normal limits. ? ?Procedure: [name] was taken emergently from the emergency room to the operating room.  Afrin had previously been sprayed within each nostril.  Topical anesthetic of viscous lidocaine was placed on nasal trumpet sincerely enlarged.  #6.5 nasotracheal tube was placed over the flexible fiberoptic bronchoscope.  After adequate anesthesia and the patient sitting in an upright position the flexible scope was introduced into the left nostril.  This was then advanced through the nasopharynx to the area of the larynx.  Bronchoscope was then advanced through the vocal cords down to the carina.  The endotracheal tube was then placed over the bronchoscope via a Seldinger technique.  Bronchoscope was then advanced outward.  Balloon was then inflated patient was then fully anesthetized.  Breath sounds were heard CO2 was returned.  The endotracheal tube was then secured in position and the patient was transferred to the intensive care unit in stable condition. ? ?Dispo:   Good ? ?Plan: We will remain intubated until the angioedema has resolved.  Care will be taken over by ICU team. ? ?Deanna [name] ? ?01/20/2022 ?9:00 AM ?  ?

## 2022-01-20 NOTE — ED Provider Notes (Signed)
? ?[facility] ?Provider Note ? ? ? Event Date/Time  ? First MD Initiated Contact with Patient 01/20/22 1921   ?  (approximate) ? ? ?History  ? ?Shortness of Breath ? ? ?HPI ? ?Deanna [name] is a 71 y.o. female with history of CHF, CAD, diabetes, hyperlipidemia, hypertension, ESRD on dialysis, anemia, and stroke who presents with shortness of breath since earlier today associated with generalized weakness and fever.  Patient denies acute pain. ? ? ?Physical Exam  ? ?Triage Vital Signs: ?ED Triage Vitals [01/20/22 1907]  ?Enc Vitals Group  ?   BP (!) 187/120  ?   Pulse Rate (!) 145  ?   Resp (!) 38  ?   Temp (!) 100.8 ?F (38.2 ?C)  ?   Temp Source Oral  ?   SpO2 96 %  ?   Weight   ?   Height   ?   Head Circumference   ?   Peak Flow   ?   Pain Score   ?   Pain Loc   ?   Pain Edu?   ?   Excl. in [place]?   ? ? ?Most recent vital signs: ?Vitals:  ? 01/20/22 2230 01/20/22 2300  ?BP: 140/80 137/76  ?Pulse: 94 89  ?Resp: (!) 22 (!) 25  ?Temp:    ?SpO2: 99% 100%  ? ? ? ?General: Alert and oriented, ill-appearing. ?CV:  Good peripheral perfusion.  Tachycardic.  Normal heart sounds. ?Resp:  Increased respiratory effort with accessory muscle use.  Rhonchi bilaterally. ?Abd:  Soft and nontender.  No distention.  ?Other:  Oropharynx clear.  No tongue swelling.  No significant peripheral edema. ? ? ?ED Results / Procedures / Treatments  ? ?Labs ?(all labs ordered are listed, but only abnormal results are displayed) ?Labs Reviewed  ?COMPREHENSIVE METABOLIC PANEL - Abnormal; Notable for the following components:  ?    Result Value  ? CO2 21 (*)   ? Glucose, Bld 120 (*)   ? BUN 50 (*)   ? Creatinine, Ser 3.46 (*)   ? Calcium 8.8 (*)   ? Albumin 2.9 (*)   ? AST 69 (*)   ? ALT 81 (*)   ? Alkaline Phosphatase 394 (*)   ? GFR, Estimated 14 (*)   ? Anion gap 17 (*)   ? All other components within normal limits  ?CBC WITH DIFFERENTIAL/PLATELET - Abnormal; Notable for the following components:  ? WBC 23.2 (*)   ?  Hemoglobin 11.1 (*)   ? MCHC 29.4 (*)   ? RDW 15.9 (*)   ? Neutro Abs 19.2 (*)   ? Monocytes Absolute 1.4 (*)   ? Abs Immature Granulocytes 0.71 (*)   ? All other components within normal limits  ?LACTIC ACID, PLASMA - Abnormal; Notable for the following components:  ? Lactic Acid, Venous 4.3 (*)   ? All other components within normal limits  ?BRAIN NATRIURETIC PEPTIDE - Abnormal; Notable for the following components:  ? B Natriuretic Peptide 4,393.1 (*)   ? All other components within normal limits  ?APTT - Abnormal; Notable for the following components:  ? aPTT 21 (*)   ? All other components within normal limits  ?BLOOD GAS, VENOUS - Abnormal; Notable for the following components:  ? pH, Ven 7.24 (*)   ? Acid-base deficit 3.8 (*)   ? All other components within normal limits  ?CBG MONITORING, ED - Abnormal; Notable for the following components:  ? Glucose-Capillary <  10 (*)   ? All other components within normal limits  ?CBG MONITORING, ED - Abnormal; Notable for the following components:  ? Glucose-Capillary 120 (*)   ? All other components within normal limits  ?CBG MONITORING, ED - Abnormal; Notable for the following components:  ? Glucose-Capillary 18 (*)   ? All other components within normal limits  ?CBG MONITORING, ED - Abnormal; Notable for the following components:  ? Glucose-Capillary 69 (*)   ? All other components within normal limits  ?TROPONIN I (HIGH SENSITIVITY) - Abnormal; Notable for the following components:  ? Troponin I (High Sensitivity) 94 (*)   ? All other components within normal limits  ?TROPONIN I (HIGH SENSITIVITY) - Abnormal; Notable for the following components:  ? Troponin I (High Sensitivity) 229 (*)   ? All other components within normal limits  ?RESP PANEL BY RT-PCR (FLU A&B, COVID) [id number]  ?CULTURE, BLOOD (ROUTINE X 2)  ?CULTURE, BLOOD (ROUTINE X 2)  ?LACTIC ACID, PLASMA  ?PROTIME-INR  ?PROCALCITONIN  ?URINALYSIS, COMPLETE (UACMP) WITH MICROSCOPIC  ?GAMMA GT  ?COMPREHENSIVE  METABOLIC PANEL  ?CBC  ?RENAL FUNCTION PANEL  ?CBC  ?HEPATITIS B SURFACE ANTIGEN  ?HEPATITIS B SURFACE ANTIBODY,QUALITATIVE  ?HEPATITIS B SURFACE ANTIBODY, QUANTITATIVE  ?CBG MONITORING, ED  ?TYPE AND SCREEN  ?TROPONIN I (HIGH SENSITIVITY)  ?TROPONIN I (HIGH SENSITIVITY)  ? ? ? ?EKG ? ?ED ECG REPORT ?IArta Silence, the attending physician, personally viewed and interpreted this ECG. ? ?Date: 01/20/2022 ?EKG Time: 1908 ?Rate: 144 ?Rhythm: Sinus tachycardia ?QRS Axis: normal ?Intervals: normal ?ST/T Wave abnormalities: Nonspecific ST abnormality ?Narrative Interpretation: Nonspecific abnormalities with no evidence of acute ischemia ? ? ? ?RADIOLOGY ? ?Chest x-ray: I independently viewed and interpreted the images; there are bilateral interstitial opacities ? ?PROCEDURES: ? ?Critical Care performed: Yes, see critical care procedure note(s) ? ?.Critical Care ?Performed by: Arta Silence, MD ?Authorized by: Arta Silence, MD  ? ?Critical care provider statement:  ?  Critical care time (minutes):  30 ?  Critical care was necessary to treat or prevent imminent or life-threatening deterioration of the following conditions:  Respiratory failure, cardiac failure and sepsis ?  Critical care was time spent personally by me on the following activities:  Development of treatment plan with patient or surrogate, discussions with consultants, evaluation of patient's response to treatment, examination of patient, ordering and review of laboratory studies, ordering and review of radiographic studies, ordering and performing treatments and interventions, pulse oximetry, re-evaluation of patient's condition and review of old charts ?  Care discussed with: admitting provider   ? ? ?MEDICATIONS ORDERED IN ED: ?Medications  ?Chlorhexidine Gluconate Cloth 2 % PADS 6 each (has no administration in time range)  ?pentafluoroprop-tetrafluoroeth (GEBAUERS) aerosol 1 application. (has no administration in time range)   ?lidocaine (PF) (XYLOCAINE) 1 % injection 5 mL (has no administration in time range)  ?lidocaine-prilocaine (EMLA) cream 1 application. (has no administration in time range)  ?0.9 %  sodium chloride infusion (has no administration in time range)  ?0.9 %  sodium chloride infusion (has no administration in time range)  ?heparin injection 1,000 Units (has no administration in time range)  ?alteplase (CATHFLO ACTIVASE) injection 2 mg (has no administration in time range)  ?metoprolol tartrate (LOPRESSOR) tablet 25 mg (has no administration in time range)  ?gabapentin (NEURONTIN) capsule 300 mg (has no administration in time range)  ?ferrous sulfate tablet 325 mg (has no administration in time range)  ?clopidogrel (PLAVIX) tablet 75 mg (has no administration in time range)  ?  aspirin EC tablet 81 mg (has no administration in time range)  ?atorvastatin (LIPITOR) tablet 40 mg (has no administration in time range)  ?vancomycin (VANCOREADY) IVPB 750 mg/150 mL (has no administration in time range)  ?dextrose 10 % infusion ( Intravenous New Bag/Given 01/20/22 2330)  ?heparin injection 5,000 Units (has no administration in time range)  ?sodium chloride flush (NS) 0.9 % injection 3 mL (has no administration in time range)  ?ceFEPIme (MAXIPIME) 1 g in sodium chloride 0.9 % 100 mL IVPB (1 g Intravenous Not Given 01/20/22 2310)  ?vancomycin (VANCOCIN) IVPB 1000 mg/200 mL premix (has no administration in time range)  ?dextrose 50 % solution (has no administration in time range)  ?dextrose 50 % solution (has no administration in time range)  ?acetaminophen (TYLENOL) tablet 650 mg (650 mg Oral Given 01/20/22 1947)  ?lactated ringers bolus 500 mL (0 mLs Intravenous Stopped 01/20/22 2057)  ?vancomycin (VANCOCIN) IVPB 1000 mg/200 mL premix (0 mg Intravenous Stopped 01/20/22 2122)  ?ceFEPIme (MAXIPIME) 2 g in sodium chloride 0.9 % 100 mL IVPB (0 g Intravenous Stopped 01/20/22 2018)  ?dextrose 50 % solution 25 g (25 g Intravenous Given by Other  01/20/22 2229)  ?dextrose 50 % solution 25 g (25 g Intravenous Given 01/20/22 2230)  ? ? ? ?IMPRESSION / MDM / ASSESSMENT AND PLAN / ED COURSE  ?I reviewed the triage vital signs and the nursing notes. ? ?71 year old femal

## 2022-01-21 ENCOUNTER — Encounter: Payer: Self-pay | Admitting: Internal Medicine

## 2022-01-21 ENCOUNTER — Inpatient Hospital Stay: Payer: Medicare HMO

## 2022-01-21 DIAGNOSIS — A419 Sepsis, unspecified organism: Secondary | ICD-10-CM

## 2022-01-21 DIAGNOSIS — I5020 Unspecified systolic (congestive) heart failure: Secondary | ICD-10-CM

## 2022-01-21 DIAGNOSIS — R0602 Shortness of breath: Secondary | ICD-10-CM | POA: Diagnosis not present

## 2022-01-21 DIAGNOSIS — E11649 Type 2 diabetes mellitus with hypoglycemia without coma: Secondary | ICD-10-CM | POA: Diagnosis not present

## 2022-01-21 DIAGNOSIS — R6520 Severe sepsis without septic shock: Secondary | ICD-10-CM

## 2022-01-21 DIAGNOSIS — N186 End stage renal disease: Secondary | ICD-10-CM | POA: Diagnosis not present

## 2022-01-21 DIAGNOSIS — Z992 Dependence on renal dialysis: Secondary | ICD-10-CM

## 2022-01-21 LAB — RENAL FUNCTION PANEL
Albumin: 2.7 g/dL — ABNORMAL LOW (ref 3.5–5.0)
Anion gap: 10 (ref 5–15)
BUN: 52 mg/dL — ABNORMAL HIGH (ref 8–23)
CO2: 26 mmol/L (ref 22–32)
Calcium: 8.4 mg/dL — ABNORMAL LOW (ref 8.9–10.3)
Chloride: 102 mmol/L (ref 98–111)
Creatinine, Ser: 3.56 mg/dL — ABNORMAL HIGH (ref 0.44–1.00)
GFR, Estimated: 13 mL/min — ABNORMAL LOW (ref >60)
Glucose, Bld: 49 mg/dL — ABNORMAL LOW (ref 70–99)
Phosphorus: 5.6 mg/dL — ABNORMAL HIGH (ref 2.5–4.6)
Potassium: 3.6 mmol/L (ref 3.5–5.1)
Sodium: 138 mmol/L (ref 135–145)

## 2022-01-21 LAB — CBC
HCT: 33.7 % — ABNORMAL LOW (ref 36.0–46.0)
HCT: 30.8 % — ABNORMAL LOW (ref 36.0–46.0)
HCT: 27.1 % — ABNORMAL LOW (ref 36.0–46.0)
Hemoglobin: 9.9 g/dL — ABNORMAL LOW (ref 12.0–15.0)
Hemoglobin: 9.1 g/dL — ABNORMAL LOW (ref 12.0–15.0)
Hemoglobin: 8.3 g/dL — ABNORMAL LOW (ref 12.0–15.0)
MCH: 26.8 pg (ref 26.0–34.0)
MCH: 26.7 pg (ref 26.0–34.0)
MCH: 26.3 pg (ref 26.0–34.0)
MCHC: 29.4 g/dL — ABNORMAL LOW (ref 30.0–36.0)
MCHC: 29.5 g/dL — ABNORMAL LOW (ref 30.0–36.0)
MCHC: 30.6 g/dL (ref 30.0–36.0)
MCV: 91.1 fL (ref 80.0–100.0)
MCV: 90.3 fL (ref 80.0–100.0)
MCV: 86 fL (ref 80.0–100.0)
Platelets: 243 10*3/uL (ref 150–400)
Platelets: 228 10*3/uL (ref 150–400)
Platelets: 228 10*3/uL (ref 150–400)
RBC: 3.7 MIL/uL — ABNORMAL LOW (ref 3.87–5.11)
RBC: 3.41 MIL/uL — ABNORMAL LOW (ref 3.87–5.11)
RBC: 3.15 MIL/uL — ABNORMAL LOW (ref 3.87–5.11)
RDW: 15.6 % — ABNORMAL HIGH (ref 11.5–15.5)
RDW: 15.6 % — ABNORMAL HIGH (ref 11.5–15.5)
RDW: 15.3 % (ref 11.5–15.5)
WBC: 13.5 10*3/uL — ABNORMAL HIGH (ref 4.0–10.5)
WBC: 12.5 10*3/uL — ABNORMAL HIGH (ref 4.0–10.5)
WBC: 9.1 10*3/uL (ref 4.0–10.5)
nRBC: 0 % (ref 0.0–0.2)
nRBC: 0.2 % (ref 0.0–0.2)
nRBC: 0 % (ref 0.0–0.2)

## 2022-01-21 LAB — CBG MONITORING, ED
Glucose-Capillary: 108 mg/dL — ABNORMAL HIGH (ref 70–99)
Glucose-Capillary: 120 mg/dL — ABNORMAL HIGH (ref 70–99)
Glucose-Capillary: 120 mg/dL — ABNORMAL HIGH (ref 70–99)
Glucose-Capillary: 121 mg/dL — ABNORMAL HIGH (ref 70–99)
Glucose-Capillary: 136 mg/dL — ABNORMAL HIGH (ref 70–99)
Glucose-Capillary: 221 mg/dL — ABNORMAL HIGH (ref 70–99)
Glucose-Capillary: 37 mg/dL — CL (ref 70–99)
Glucose-Capillary: 69 mg/dL — ABNORMAL LOW (ref 70–99)
Glucose-Capillary: 83 mg/dL (ref 70–99)
Glucose-Capillary: 95 mg/dL (ref 70–99)
Glucose-Capillary: 97 mg/dL (ref 70–99)

## 2022-01-21 LAB — HEPARIN LEVEL (UNFRACTIONATED)
Heparin Unfractionated: 0.46 IU/mL (ref 0.30–0.70)
Heparin Unfractionated: 0.3 IU/mL (ref 0.30–0.70)

## 2022-01-21 LAB — BLOOD GAS, VENOUS
Acid-base deficit: 3.8 mmol/L — ABNORMAL HIGH (ref 0.0–2.0)
Bicarbonate: 24.4 mmol/L (ref 20.0–28.0)
O2 Saturation: 58.2 %
Patient temperature: 37
pCO2, Ven: 57 mmHg (ref 44–60)
pH, Ven: 7.24 — ABNORMAL LOW (ref 7.25–7.43)
pO2, Ven: 41 mmHg (ref 32–45)

## 2022-01-21 LAB — TYPE AND SCREEN
ABO/RH(D): A NEG
Antibody Screen: NEGATIVE

## 2022-01-21 LAB — MAGNESIUM: Magnesium: 1.8 mg/dL (ref 1.7–2.4)

## 2022-01-21 LAB — COMPREHENSIVE METABOLIC PANEL
ALT: 76 U/L — ABNORMAL HIGH (ref 0–44)
AST: 72 U/L — ABNORMAL HIGH (ref 15–41)
Albumin: 2.6 g/dL — ABNORMAL LOW (ref 3.5–5.0)
Alkaline Phosphatase: 314 U/L — ABNORMAL HIGH (ref 38–126)
Anion gap: 10 (ref 5–15)
BUN: 52 mg/dL — ABNORMAL HIGH (ref 8–23)
CO2: 27 mmol/L (ref 22–32)
Calcium: 8.2 mg/dL — ABNORMAL LOW (ref 8.9–10.3)
Chloride: 100 mmol/L (ref 98–111)
Creatinine, Ser: 3.42 mg/dL — ABNORMAL HIGH (ref 0.44–1.00)
GFR, Estimated: 14 mL/min — ABNORMAL LOW (ref >60)
Glucose, Bld: 115 mg/dL — ABNORMAL HIGH (ref 70–99)
Potassium: 3.7 mmol/L (ref 3.5–5.1)
Sodium: 137 mmol/L (ref 135–145)
Total Bilirubin: 0.6 mg/dL (ref 0.3–1.2)
Total Protein: 7.1 g/dL (ref 6.5–8.1)

## 2022-01-21 LAB — GLUCOSE, CAPILLARY
Glucose-Capillary: 136 mg/dL — ABNORMAL HIGH (ref 70–99)
Glucose-Capillary: 192 mg/dL — ABNORMAL HIGH (ref 70–99)

## 2022-01-21 LAB — HEPATITIS B SURFACE ANTIBODY,QUALITATIVE: Hep B S Ab: NONREACTIVE

## 2022-01-21 LAB — HEPATITIS B SURFACE ANTIGEN: Hepatitis B Surface Ag: NONREACTIVE

## 2022-01-21 LAB — TROPONIN I (HIGH SENSITIVITY)
Troponin I (High Sensitivity): 619 ng/L (ref <18)
Troponin I (High Sensitivity): 656 ng/L (ref <18)

## 2022-01-21 LAB — GAMMA GT: GGT: 212 U/L — ABNORMAL HIGH (ref 7–50)

## 2022-01-21 MED ORDER — DEXTROSE 50 % IV SOLN
1.0000 | Freq: Once | INTRAVENOUS | Status: AC
  Administered 2022-01-21: 50 mL via INTRAVENOUS

## 2022-01-21 MED ORDER — HEPARIN SODIUM (PORCINE) 1000 UNIT/ML IJ SOLN
INTRAMUSCULAR | Status: AC
  Filled 2022-01-21: qty 10

## 2022-01-21 MED ORDER — LIDOCAINE-PRILOCAINE 2.5-2.5 % EX CREA: 1.0000 "application " | TOPICAL_CREAM | CUTANEOUS | Status: DC | PRN

## 2022-01-21 MED ORDER — HEPARIN (PORCINE) 25000 UT/250ML-% IV SOLN
1400.0000 [IU]/h | INTRAVENOUS | Status: DC
  Administered 2022-01-21 – 2022-01-22 (×2): 1200 [IU]/h via INTRAVENOUS
  Filled 2022-01-21 (×2): qty 250

## 2022-01-21 MED ORDER — DEXTROSE 50 % IV SOLN: 1.0000 | Freq: Once | INTRAVENOUS | Status: AC

## 2022-01-21 MED ORDER — LIDOCAINE HCL (PF) 1 % IJ SOLN: 5.0000 mL | INTRAMUSCULAR | Status: DC | PRN

## 2022-01-21 MED ORDER — DEXTROSE 50 % IV SOLN
INTRAVENOUS | Status: AC
  Administered 2022-01-21: 50 mL via INTRAVENOUS
  Filled 2022-01-21: qty 100

## 2022-01-21 MED ORDER — TECHNETIUM TO 99M ALBUMIN AGGREGATED
4.1300 | Freq: Once | INTRAVENOUS | Status: AC | PRN
  Administered 2022-01-21: 4.13 via INTRAVENOUS

## 2022-01-21 MED ORDER — HEPARIN BOLUS VIA INFUSION
3000.0000 [IU] | Freq: Once | INTRAVENOUS | Status: DC
  Filled 2022-01-21: qty 3000

## 2022-01-21 MED ORDER — SODIUM CHLORIDE 0.9 % IV SOLN: 100.0000 mL | INTRAVENOUS | Status: DC | PRN

## 2022-01-21 MED ORDER — INSULIN ASPART 100 UNIT/ML IJ SOLN
0.0000 [IU] | Freq: Three times a day (TID) | INTRAMUSCULAR | Status: DC
  Administered 2022-01-22 – 2022-01-24 (×3): 1 [IU] via SUBCUTANEOUS
  Administered 2022-01-25: 2 [IU] via SUBCUTANEOUS
  Administered 2022-01-25: 1 [IU] via SUBCUTANEOUS
  Administered 2022-01-26: 5 [IU] via SUBCUTANEOUS
  Administered 2022-01-27: 1 [IU] via SUBCUTANEOUS
  Filled 2022-01-21 (×7): qty 1

## 2022-01-21 MED ORDER — ALTEPLASE 2 MG IJ SOLR
2.0000 mg | Freq: Once | INTRAMUSCULAR | Status: DC | PRN
  Filled 2022-01-21: qty 2

## 2022-01-21 MED ORDER — HEPARIN SODIUM (PORCINE) 1000 UNIT/ML DIALYSIS
1000.0000 [IU] | INTRAMUSCULAR | Status: DC | PRN
  Administered 2022-01-21: 1000 [IU] via INTRAVENOUS_CENTRAL
  Filled 2022-01-21 (×2): qty 1

## 2022-01-21 MED ORDER — VANCOMYCIN HCL IN DEXTROSE 1-5 GM/200ML-% IV SOLN
1000.0000 mg | Freq: Once | INTRAVENOUS | Status: AC
  Administered 2022-01-21: 1000 mg via INTRAVENOUS
  Filled 2022-01-21 (×2): qty 200

## 2022-01-21 MED ORDER — GABAPENTIN 100 MG PO CAPS
200.0000 mg | ORAL_CAPSULE | Freq: Three times a day (TID) | ORAL | Status: DC
  Administered 2022-01-21 – 2022-01-27 (×17): 200 mg via ORAL
  Filled 2022-01-21 (×18): qty 2

## 2022-01-21 MED ORDER — PENTAFLUOROPROP-TETRAFLUOROETH EX AERO
1.0000 "application " | INHALATION_SPRAY | CUTANEOUS | Status: DC | PRN
  Filled 2022-01-21: qty 30

## 2022-01-21 MED ORDER — DEXTROSE 50 % IV SOLN: 1.0000 | Freq: Once | INTRAVENOUS | Status: DC

## 2022-01-21 MED ORDER — HEPARIN BOLUS VIA INFUSION
4000.0000 [IU] | Freq: Once | INTRAVENOUS | Status: AC
  Administered 2022-01-21: 4000 [IU] via INTRAVENOUS
  Filled 2022-01-21: qty 4000

## 2022-01-21 NOTE — Progress Notes (Signed)
?[facility]  ?PROGRESS NOTE  ? ?Subjective:  ? ?Patient is lethargic but arousable. ?She is scheduled for dialysis today. ? ?Objective:  ?Vital signs: ?Blood pressure (!) 142/84, pulse 82, temperature (!) 96.9 ?F (36.1 ?C), temperature source Axillary, resp. rate 11, SpO2 100 %. ? ?Intake/Output Summary (Last 24 hours) at 01/21/2022 1227 ?Last data filed at 01/21/2022 0715 ?Gross per 24 hour  ?Intake 68.05 ml  ?Output --  ?Net 68.05 ml  ? ?Filed Weights  ? ? ? ?Physical Exam: ?General:  No acute distress  ?Head:  Normocephalic, atraumatic. Moist oral mucosal membranes  ?Eyes:  Anicteric  ?Neck:  Supple  ?Lungs:   Clear to auscultation, normal effort  ?Heart:  S1S2 no rubs  ?Abdomen:   Soft, nontender, bowel sounds present  ?Extremities:  peripheral edema.  ?Neurologic: Lethargic but arousable.  ?Skin:  No lesions  ?Access:   ? ? ?Basic Metabolic Panel: ?Recent Labs  ?Lab 01/16/22 ?0350 01/16/22 ?4098 01/17/22 ?1191 01/17/22 ?1637 01/18/22 ?0500 01/19/22 ?0530 01/20/22 ?1922 01/21/22 ?0234 01/21/22 ?0454  ?NA 137  --  131*  --  135 135 139 138 137  ?K 6.3*   < > 4.2  --  4.9 3.7 4.7 3.6 3.7  ?CL 104  --  96*  --  96* 99 101 102 100  ?CO2 23  --  26  --  25 27 21* 26 27  ?GLUCOSE 146*  --  166*  --  192* 224* 120* 49* 115*  ?BUN 53*  --  36*  --  48* 31* 50* 52* 52*  ?CREATININE 3.45*  --  2.70*  --  3.31* 2.32* 3.46* 3.56* 3.42*  ?CALCIUM 8.3*  --  7.9*  --  7.7* 7.7* 8.8* 8.4* 8.2*  ?MG 1.7  --  1.6* 2.0 2.0 1.7  --   --  1.8  ?PHOS 6.6*  --  5.2*  --  6.2* 3.7  --  5.6*  --   ? < > = values in this interval not displayed.  ? ? ?CBC: ?Recent Labs  ?Lab 01/14/22 ?2229 01/15/22 ?0249 01/16/22 ?0350 01/18/22 ?0500 01/19/22 ?0530 01/20/22 ?1922 01/21/22 ?0234 01/21/22 ?0454  ?WBC 10.1 6.9   < > 11.7* 9.4 23.2* 13.5* 12.5*  ?NEUTROABS 6.9 6.1  --  10.3* 7.9* 19.2*  --   --   ?HGB 9.3* 8.2*   < > 9.0* 8.8* 11.1* 9.9* 9.1*  ?HCT 31.5* 27.5*   < > 30.1* 28.8* 37.8 33.7* 30.8*  ?MCV 91.0 90.2   < > 88.5 88.3 92.0  91.1 90.3  ?PLT 193 209   < > 234 225 266 243 228  ? < > = values in this interval not displayed.  ? ? ? ?Urinalysis: ?No results for input(s): COLORURINE, LABSPEC, [place], GLUCOSEU, HGBUR, BILIRUBINUR, KETONESUR, PROTEINUR, UROBILINOGEN, NITRITE, LEUKOCYTESUR in the last 72 hours. ? ?Invalid input(s): APPERANCEUR  ? ? ?Imaging: ?CT Head Wo Contrast ? ?Result Date: 01/21/2022 ?CLINICAL DATA:  Generalized weakness, mental status change EXAM: CT HEAD WITHOUT CONTRAST TECHNIQUE: Contiguous axial images were obtained from the base of the skull through the vertex without intravenous contrast. RADIATION DOSE REDUCTION: This exam was performed according to the departmental dose-optimization program which includes automated exposure control, adjustment of the mA and/or kV according to patient size and/or use of iterative reconstruction technique. COMPARISON:  12/26/2021 FINDINGS: Brain: No evidence of acute infarction, hemorrhage, cerebral edema, mass, mass effect, or midline shift. No hydrocephalus or extra-axial fluid collection. Periventricular white matter changes,  likely the sequela of chronic small vessel ischemic disease. Cerebral volume is within normal limits for age. Vascular: No hyperdense vessel. Skull: Normal. Negative for fracture or focal lesion. Sinuses/Orbits: No acute finding. Other: Fluid throughout the right mastoid air cells and right middle ear, which is new from the prior exam. Debris in the right external auditory canal, likely cerumen. Trace fluid is also noted in the left mastoid air cells. No definite fluid in the left middle ear. IMPRESSION: IMPRESSION 1.  No acute intracranial process. 2. Fluid throughout the right mastoid air cells and right middle ear, which is new from the prior exam. Correlate for symptoms of otitis media. Electronically Signed   By: Merilyn Baba M.D.   On: 01/21/2022 00:58  ? ?NM Pulmonary Perfusion ? ?Result Date: 01/21/2022 ?CLINICAL DATA:  Dyspnea. High probability for  pulmonary embolus clinically. EXAM: NUCLEAR MEDICINE PERFUSION LUNG SCAN TECHNIQUE: Perfusion images were obtained in multiple projections after intravenous injection of radiopharmaceutical. Ventilation scans intentionally deferred if perfusion scan and chest x-ray adequate for interpretation during COVID 19 epidemic. RADIOPHARMACEUTICALS:  4.1 mCi Tc-39mMAA IV COMPARISON:  Chest x-ray 01/12/2022 FINDINGS: Heterogeneous decreased perfusion noted in the lung apices. There is marked decreased perfusion diffusely to the right lower lobe with heterogeneous decreased perfusion to the left lower lobe. Patient noted to have diffuse airspace disease bilaterally on yesterday's chest x-ray with confluent disease in the lung bases. IMPRESSION: Study considered indeterminate for pulmonary embolus with diffusely heterogeneous decreased perfusion in the right lower lung and both lung apices given the extensive airspace disease on yesterday's chest x-ray. Electronically Signed   By: EMisty StanleyM.D.   On: 01/21/2022 10:33  ? ?DG Chest Portable 1 View ? ?Result Date: 01/20/2022 ?CLINICAL DATA:  Shortness of breath, weakness EXAM: PORTABLE CHEST 1 VIEW COMPARISON:  01/17/2022 FINDINGS: Cardiomegaly with moderate interstitial edema, mildly progressive. Moderate right and small left pleural effusions. Associated lower lobe opacities, likely atelectasis. Right IJ dual lumen catheter terminating in the lower SVC. IMPRESSION: Cardiomegaly with moderate interstitial edema, mildly progressive. Moderate right and small left pleural effusions. Electronically Signed   By: SJulian HyM.D.   On: 01/20/2022 19:58   ? ? ?Medications:  ? ? sodium chloride    ? sodium chloride    ? sodium chloride    ? sodium chloride    ? ceFEPime (MAXIPIME) IV    ? dextrose 75 mL/hr at 01/21/22 0715  ? heparin 1,200 Units/hr (01/21/22 0715)  ? [START ON 01/23/2022] vancomycin    ? ? aspirin EC  81 mg Oral Daily  ? atorvastatin  40 mg Oral Daily  ?  Chlorhexidine Gluconate Cloth  6 each Topical Q0600  ? clopidogrel  75 mg Oral Daily  ? dextrose  1 ampule Intravenous Once  ? ferrous sulfate  325 mg Oral q AM  ? gabapentin  200 mg Oral TID  ? metoprolol tartrate  25 mg Oral BID  ? sodium chloride flush  3 mL Intravenous Q12H  ? ? ?Assessment/ Plan:  ?   ?Principal Problem: ?  SOB (shortness of breath) ?Active Problems: ?  Essential (primary) hypertension ?  HFrEF (heart failure with reduced ejection fraction) ([place] ?  Type 2 diabetes mellitus without complications ([place] ?  Anemia ?  Elevated troponin ?  Severe sepsis ([place] ?  Chest pain ?  Hypoglycemia associated with diabetes ([facility] ?  End-stage renal disease on hemodialysis ([place] ?  Pneumonia ? ?71year old female with history of diabetes, hypertension,  coronary artery disease, congestive heart failure, status post CVA, ESRD on hemodialysis now admitted with history of shortness of breath.  She was found to have hypoglycemia and angioedema and is presently improving. ? ?#1: ESKD: Patient is scheduled for dialysis today.  Will attempt fluid removal as tolerated. ? ?2: Anemia: Patient is anemia secondary to chronic kidney disease.  We will continue the IV iron and Epogen. ? ?#3: Secondary hyperparathyroidism: Patient is elevated PTH levels and lower calcium levels.  This is secondary to ESKD. ? ?#4: Pneumonia/sepsis: Patient is presently on antibiotics and will continue the same. ? ?#5: Hypoglycemia: Now improved with IV D5. ? ?#6: Hypertension: Blood pressure is within normal limits.  Continue the metoprolol at the present doses ? ? LOS: 1 ?Lyla Son, MD ?[facility] ?4/29/202312:27 PM ?  ?

## 2022-01-21 NOTE — ED Notes (Signed)
Patient transported to nuclear medicine on cardiac monitoring with this RN.  ?

## 2022-01-21 NOTE — Progress Notes (Addendum)
? ? ? ?Progress Note  ? ? ?Deanna [name]  XTG:[id number] DOB: [birth year]  DOA: 01/20/2022 ?PCP: [facility] [place]  ? ? ? ? ?Brief Narrative:  ? ? ?Medical records reviewed and are as summarized below: ? ?Deanna [name] is a 71 y.o. 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  She presented to the hospital again on 01/20/2022 for shortness of breath and generalized weakness.  She did not go to outpatient dialysis on the day that she presented to the ED.  Her oxygen saturation was 88% on room air. ? ? ? ? ?Assessment/Plan:  ? ?Principal Problem: ?  SOB (shortness of breath) ?Active Problems: ?  HFrEF (heart failure with reduced ejection fraction) ([place]) ?  Severe sepsis ([place]) ?  Pneumonia ?  Hypoglycemia associated with diabetes ([place]) ?  Type 2 diabetes mellitus without complications ([place]) ?  Elevated troponin ?  Chest pain ?  Essential (primary) hypertension ?  Anemia ?  End-stage renal disease on hemodialysis ([place]) ? ? ?Acute hypoxic respiratory failure: She is on 4 L/min oxygen.  Taper off oxygen as able. ? ?Suspected sepsis from pneumonia: Continue empiric IV antibiotics for now.  Plan to obtain CTA of the chest tomorrow which will shed more light on whether she has pneumonia or not. ? ?Suspected pulmonary embolism: Perfusion scan obtained today was reported as indeterminate.  Continue IV heparin drip for now and monitor heparin level per protocol.  Plan to obtain CTA of the chest tomorrow.  Case was discussed with Dr. Lanora Manis, who prefers that CT with contrast be done tomorrow since patient has already had dialysis today. ? ?Hypoglycemia in the setting of recent  poor oral intake, type II DM: Glucose levels have stabilized.  Patient states she takes NovoLog 15 units in the morning, 20 units in the afternoon and 25 units in the evening, she also takes Lantus 65 units nightly.  She has not had problems with this regimen for years.  She attributes to hypoglycemia because she did not eat on the day of admission. ? ?ESRD, secondary hyperparathyroidism, anemia of chronic disease: Follow-up with nephrologist for hemodialysis. ? ?Elevated troponin: This is likely from demand ischemia.  No chest pain. ? ?Chronic systolic and diastolic CHF probably with acute exacerbation from missed outpatient dialysis.  She had hemodialysis today. ? ?Patient insists that she does not want to go to SNF and she does not want to go to live with her daughters in [place]. ? ?Other comorbidities include CAD, hypertension, hyperlipidemia, recent intubation for angioedema from lisinopril/Entresto ? ? ?Diet Order   ? ?       ?  Diet renal/carb modified with fluid restriction Diet-HS Snack? Nothing; Fluid restriction: 1200 mL Fluid; Room service appropriate? Yes; Fluid consistency: Thin  Diet effective now       ?  ? ?  ?  ? ?  ? ? ? ? ? ? ? ? ?Consultants: ?Nephrologist ? ?Procedures: ?None ? ? ? ?Medications:  ? ? aspirin EC  81 mg Oral Daily  ? atorvastatin  40 mg Oral Daily  ? Chlorhexidine Gluconate Cloth  6 each Topical Q0600  ? clopidogrel  75 mg Oral Daily  ? dextrose  1 ampule Intravenous Once  ? ferrous sulfate  325 mg Oral q AM  ? gabapentin  200 mg Oral TID  ? heparin sodium (porcine)      ? metoprolol tartrate  25 mg Oral BID  ? sodium chloride flush  3 mL Intravenous Q12H  ? ?Continuous Infusions: ? sodium chloride    ? sodium chloride    ? sodium chloride    ? sodium chloride    ? ceFEPime (MAXIPIME) IV    ? dextrose 75 mL/hr at 01/21/22 0715  ? heparin 1,200 Units/hr (01/21/22 0715)  ? [START ON 01/23/2022] vancomycin    ? vancomycin    ? ? ? ?Anti-infectives (From admission, onward)  ? ? Start      Dose/Rate Route Frequency Ordered Stop  ? 01/23/22 1200  vancomycin (VANCOCIN) IVPB 1000 mg/200 mL premix       ? 1,000 mg ?200 mL/hr over 60 Minutes Intravenous Every M-W-F (Hemodialysis) 01/20/22 2322    ? 01/21/22 1600  vancomycin (VANCOCIN) IVPB 1000 mg/200 mL premix       ? 1,000 mg ?200 mL/hr over 60 Minutes Intravenous  Once 01/21/22 1354    ? 01/20/22 2315  ceFEPIme (MAXIPIME) 1 g in sodium chloride 0.9 % 100 mL IVPB       ? 1 g ?200 mL/hr over 30 Minutes Intravenous Every 24 hours 01/20/22 2305    ? 01/20/22 2300  vancomycin (VANCOCIN) IVPB 1000 mg/200 mL premix  Status:  Discontinued       ? 1,000 mg ?200 mL/hr over 60 Minutes Intravenous  Once 01/20/22 2254 01/20/22 2257  ? 01/20/22 2300  vancomycin (VANCOREADY) IVPB 750 mg/150 mL       ? 750 mg ?150 mL/hr over 60 Minutes Intravenous  Once 01/20/22 2258 01/21/22 0430  ? 01/20/22 1945  vancomycin (VANCOCIN) IVPB 1000 mg/200 mL premix       ? 1,000 mg ?200 mL/hr over 60 Minutes Intravenous  Once 01/20/22 1937 01/20/22 2122  ? 01/20/22 1945  ceFEPIme (MAXIPIME) 2 g in sodium chloride 0.9 % 100 mL IVPB       ? 2 g ?200 mL/hr over 30 Minutes Intravenous  Once 01/20/22 1937 01/20/22 2018  ? ?  ? ? ? ? ? ? ? ? ? ?Family Communication/Anticipated D/C date and plan/Code Status  ? ?DVT prophylaxis:  ? ? ?  Code Status: Full Code ? ?Family Communication: None ?Disposition Plan: Plan to discharge home in 2 to 3 days ? ? ? ? ? ? ? ? ?Subjective:  ? ?Interval events chart reviewed.  No shortness of breath or chest pain.  She said she wants to eat.  She said she had taken her regular dose of insulin at home but she did not feel like eating afterwards. ? ?Objective:  ? ? ?Vitals:  ? 01/21/22 1500 01/21/22 1515 01/21/22 1530 01/21/22 1539  ?BP: (!) 157/84 (!) 152/77 (!) 153/74 (!) 154/84  ?Pulse: 90 84 86 91  ?Resp: (!) '28 18 17 18  '$ ?Temp:    97.8 ?F (36.6 ?C)  ?TempSrc:    Oral  ?SpO2: 100% 100% 100% 100%  ? ?No data found. ? ? ?Intake/Output Summary (Last 24 hours)  at 01/21/2022 1551 ?Last data filed at 01/21/2022 1539 ?Gross per 24 hour  ?Intake 68.05 ml  ?Output 1000 ml  ?Net -931.95 ml  ? ?Filed Weights  ? ? ?Exam: ? ?GEN: NAD ?SKIN: No rash ?EYES: EOMI ?ENT: MMM ?CV: RRR ?PULM: CTA B ?ABD: soft, ND, NT, +BS ?CNS: AAO x 3,  non focal ?EXT: No edema or tenderness ? ? ? ?  ? ? ?Data Reviewed:  ? ?I have personally reviewed following labs and imaging studies: ? ?Labs: ?Labs show the following:  ? ?Basic Metabolic Panel: ?Recent Labs  ?Lab 01/16/22 ?0350 01/16/22 ?5366 01/17/22 ?4403 01/17/22 ?1637 01/18/22 ?0500 01/19/22 ?0530 01/20/22 ?1922 01/21/22 ?0234 01/21/22 ?0454  ?NA 137  --  131*  --  135 135 139 138 137  ?K 6.3*   < > 4.2  --  4.9 3.7 4.7 3.6 3.7  ?CL 104  --  96*  --  96* 99 101 102 100  ?CO2 23  --  26  --  25 27 21* 26 27  ?GLUCOSE 146*  --  166*  --  192* 224* 120* 49* 115*  ?BUN 53*  --  36*  --  48* 31* 50* 52* 52*  ?CREATININE 3.45*  --  2.70*  --  3.31* 2.32* 3.46* 3.56* 3.42*  ?CALCIUM 8.3*  --  7.9*  --  7.7* 7.7* 8.8* 8.4* 8.2*  ?MG 1.7  --  1.6* 2.0 2.0 1.7  --   --  1.8  ?PHOS 6.6*  --  5.2*  --  6.2* 3.7  --  5.6*  --   ? < > = values in this interval not displayed.  ? ?GFR ?Estimated Creatinine Clearance: 18.2 mL/min (A) (by C-G formula based on SCr of 3.42 mg/dL (H)). ?Liver Function Tests: ?Recent Labs  ?Lab 01/15/22 ?0200 01/17/22 ?4742 01/20/22 ?1922 01/21/22 ?0234 01/21/22 ?0454  ?AST 76*  --  69*  --  72*  ?ALT 117*  --  81*  --  76*  ?ALKPHOS 344*  --  394*  --  314*  ?BILITOT 0.4  --  0.9  --  0.6  ?PROT 6.8  --  7.3  --  7.1  ?ALBUMIN 2.4* 2.4* 2.9* 2.7* 2.6*  ? ?No results for input(s): LIPASE, AMYLASE in the last 168 hours. ?No results for input(s): AMMONIA in the last 168 hours. ?Coagulation profile ?Recent Labs  ?Lab 01/20/22 ?1922  ?INR 1.1  ? ? ?CBC: ?Recent Labs  ?Lab 01/14/22 ?2229 01/15/22 ?0249 01/16/22 ?0350 01/18/22 ?0500 01/19/22 ?0530 01/20/22 ?1922 01/21/22 ?0234 01/21/22 ?0454 01/21/22 ?1445  ?WBC 10.1 6.9   < > 11.7* 9.4 23.2*  13.5* 12.5* 9.1  ?NEUTROABS 6.9 6.1  --  10.3* 7.9* 19.2*  --   --   --   ?HGB 9.3* 8.2*   < > 9.0* 8.8* 11.1* 9.9* 9.1* 8.3*  ?HCT 31.5* 27.5*   < > 30.1* 28.8* 37.8 33.7* 30.8* 27.1*  ?MCV 91.0 90.2

## 2022-01-21 NOTE — Progress Notes (Signed)
ANTICOAGULATION CONSULT NOTE - Follow up ? ?Pharmacy Consult for Heparin  ?Indication: chest pain/ACS ? ?Allergies  ?Allergen Reactions  ? Ace Inhibitors Swelling  ? Entresto [Sacubitril-Valsartan] Swelling  ?  Patient said she took both lisinopril and Entresto so it is not clear which one she actually reacted to.  For safety reasons, she has been advised to avoid both ACE inhibitors and Entresto.  ? Lisinopril Swelling  ?  Severe Angioedema (requiring Nasotracheal intubation)  ? Tomato   ? Penicillins Rash  ? ? ?Patient Measurements: ?Weight:  (unable to assess due to safety concerns) ?Heparin Dosing Weight: 90.7 kg  ? ?Vital Signs: ?Temp: (P) 98.2 ?F (36.8 ?C) (04/29 2315) ?Temp Source: (P) Oral (04/29 2315) ?BP: 129/74 (04/29 2001) ?Pulse Rate: 94 (04/29 2001) ? ?Labs: ?Recent Labs  ?  01/20/22 ?1922 01/20/22 ?2123 01/21/22 ?0234 01/21/22 ?0329 01/21/22 ?0454 01/21/22 ?1445 01/21/22 ?2255  ?HGB 11.1*  --  9.9*  --  9.1* 8.3*  --   ?HCT 37.8  --  33.7*  --  30.8* 27.1*  --   ?PLT 266  --  243  --  228 228  --   ?APTT 21*  --   --   --   --   --   --   ?LABPROT 13.6  --   --   --   --   --   --   ?INR 1.1  --   --   --   --   --   --   ?HEPARINUNFRC  --   --   --   --   --  0.46 0.30  ?CREATININE 3.46*  --  3.56*  --  3.42*  --   --   ?TROPONINIHS 94* 229* 656* 619*  --   --   --   ? ? ?Estimated Creatinine Clearance: 18.2 mL/min (A) (by C-G formula based on SCr of 3.42 mg/dL (H)). ? ? ?Medical History: ?Past Medical History:  ?Diagnosis Date  ? Coronary artery disease   ? Diabetes mellitus without complication ([place])   ? Hypertension   ? Stroke [facility])   ? ? ? ?Assessment: ?Pharmacy consulted to dose heparin in this 71 year old female with sepsis, now ACS/NSTEMI.    ? ?CrCl = 17.5 ml/min  ? ?Goal of Therapy:  ?Heparin level 0.3-0.7 units/ml ?Monitor platelets by anticoagulation protocol: Yes ?  ?Relevant Results: ?4/29@ 1445  HL 0.46 - therapeutic x 1 @ 1200 units/hr ? ?Plan:  ?4/29:  HL @ 2255 = 0.30, therapeutic X  2 ?Will continue pt on current rate and recheck HL on 4/30 with AM labs.  ? ?01/21/2022 11:25 PM ? ? ?

## 2022-01-21 NOTE — Progress Notes (Signed)
ANTICOAGULATION CONSULT NOTE - Initial Consult ? ?Pharmacy Consult for Heparin  ?Indication: chest pain/ACS ? ?Allergies  ?Allergen Reactions  ? Ace Inhibitors Swelling  ? Entresto [Sacubitril-Valsartan] Swelling  ?  Patient said she took both lisinopril and Entresto so it is not clear which one she actually reacted to.  For safety reasons, she has been advised to avoid both ACE inhibitors and Entresto.  ? Lisinopril Swelling  ?  Severe Angioedema (requiring Nasotracheal intubation)  ? Tomato   ? Penicillins Rash  ? ? ?Patient Measurements: ?  ?Heparin Dosing Weight: 90.7 kg  ? ?Vital Signs: ?Temp: 96.8 ?F (36 ?C) (04/28 2200) ?Temp Source: Rectal (04/28 2200) ?BP: 145/83 (04/29 0245) ?Pulse Rate: 82 (04/29 0245) ? ?Labs: ?Recent Labs  ?  01/19/22 ?0530 01/20/22 ?1922 01/20/22 ?2123 01/21/22 ?0234  ?HGB 8.8* 11.1*  --  9.9*  ?HCT 28.8* 37.8  --  33.7*  ?PLT 225 266  --  243  ?APTT  --  21*  --   --   ?LABPROT  --  13.6  --   --   ?INR  --  1.1  --   --   ?CREATININE 2.32* 3.46*  --  3.56*  ?TROPONINIHS  --  94* 229* 656*  ? ? ?Estimated Creatinine Clearance: 17.5 mL/min (A) (by C-G formula based on SCr of 3.56 mg/dL (H)). ? ? ?Medical History: ?Past Medical History:  ?Diagnosis Date  ? Coronary artery disease   ? Diabetes mellitus without complication ([place])   ? Hypertension   ? Stroke [facility])   ? ? ?Medications:  ?(Not in a hospital admission)  ? ?Assessment: ?Pharmacy consulted to dose heparin in this 71 year old female with sepsis, now ACS/NSTEMI.   Pt received heparin 1000 units via dialysis catheter  on 4/29 @ 0300.  ?CrCl = 17.5 ml/min  ? ?Goal of Therapy:  ?Heparin level 0.3-0.7 units/ml ?Monitor platelets by anticoagulation protocol: Yes ?  ?Plan:  ?Give 4000 units bolus x 1 ?Start heparin infusion at 1200 units/hr ?Check anti-Xa level in 8 hours and daily while on heparin ?Continue to monitor H&H and platelets ? ?Gae Bihl D ?01/21/2022,4:08 AM ? ? ?

## 2022-01-21 NOTE — ED Notes (Signed)
Unable to draw back labs from either IV after multiple attempts.  ?

## 2022-01-21 NOTE — ED Notes (Signed)
Deanna [name] made aware of pt troponin 656 via face to face ?

## 2022-01-21 NOTE — Progress Notes (Signed)
ANTICOAGULATION CONSULT NOTE - Follow up ? ?Pharmacy Consult for Heparin  ?Indication: chest pain/ACS ? ?Allergies  ?Allergen Reactions  ? Ace Inhibitors Swelling  ? Entresto [Sacubitril-Valsartan] Swelling  ?  Patient said she took both lisinopril and Entresto so it is not clear which one she actually reacted to.  For safety reasons, she has been advised to avoid both ACE inhibitors and Entresto.  ? Lisinopril Swelling  ?  Severe Angioedema (requiring Nasotracheal intubation)  ? Tomato   ? Penicillins Rash  ? ? ?Patient Measurements: ?Weight:  (unable to assess due to safety concerns) ?Heparin Dosing Weight: 90.7 kg  ? ?Vital Signs: ?Temp: 97.8 ?F (36.6 ?C) (04/29 1539) ?Temp Source: Oral (04/29 1539) ?BP: 154/84 (04/29 1545) ?Pulse Rate: 88 (04/29 1553) ? ?Labs: ?Recent Labs  ?  01/20/22 ?1922 01/20/22 ?2123 01/21/22 ?0234 01/21/22 ?0329 01/21/22 ?0454 01/21/22 ?1445  ?HGB 11.1*  --  9.9*  --  9.1* 8.3*  ?HCT 37.8  --  33.7*  --  30.8* 27.1*  ?PLT 266  --  243  --  228 228  ?APTT 21*  --   --   --   --   --   ?LABPROT 13.6  --   --   --   --   --   ?INR 1.1  --   --   --   --   --   ?HEPARINUNFRC  --   --   --   --   --  0.46  ?CREATININE 3.46*  --  3.56*  --  3.42*  --   ?TROPONINIHS 94* 229* 656* 619*  --   --   ? ? ?Estimated Creatinine Clearance: 18.2 mL/min (A) (by C-G formula based on SCr of 3.42 mg/dL (H)). ? ? ?Medical History: ?Past Medical History:  ?Diagnosis Date  ? Coronary artery disease   ? Diabetes mellitus without complication ([place])   ? Hypertension   ? Stroke [facility])   ? ? ? ?Assessment: ?Pharmacy consulted to dose heparin in this 71 year old female with sepsis, now ACS/NSTEMI.    ? ?CrCl = 17.5 ml/min  ? ?Goal of Therapy:  ?Heparin level 0.3-0.7 units/ml ?Monitor platelets by anticoagulation protocol: Yes ?  ?Relevant Results: ?4/29@ 1445  HL 0.46 - therapeutic x 1 @ 1200 units/hr ? ?Plan:  ?Heparin level therapeutic x 1 dose ?Will continue heparin infusion at 1200 units/hr ?Will repeat HL in 8  hrs to confirm therapeutic rate ?F/U daily CBC while on heparin gtt ? ?Kenzey Birkland Rodriguez-Guzman PharmD, BCPS ?01/21/2022 4:53 PM ? ? ?

## 2022-01-21 NOTE — ED Notes (Signed)
Attempted to contact patient's family member [name], no answer at this time.  ?

## 2022-01-21 NOTE — Progress Notes (Signed)
Cross Cover ?Patient with elevating troponins [phone] - likely demand  ?BNP 4393.1 ?No complaints of chest pain ?Repeat EKG reviewed by me:  SR PVC's LVH, prolonged QT. ?Continued hypoglycemic events ? ?Patient denies chest pains. States she feels much better. No increased work of breathing with oxygen via nasal cannula ? ?-requested stat chem panel and mag ?-D10 infusing at 50, increased to 75 - cbg monitoring ever 2h ?-start heparin - consult cardiology in am ? ? ?

## 2022-01-21 NOTE — ED Notes (Signed)
[place], patient's granddaughter contacted. Consent obtained for dialysis with charge RN, Janett Billow.  ?

## 2022-01-21 NOTE — ED Notes (Signed)
Cbg 120 ?

## 2022-01-21 NOTE — ED Notes (Signed)
[id number] ?

## 2022-01-22 ENCOUNTER — Inpatient Hospital Stay: Payer: Medicare HMO

## 2022-01-22 DIAGNOSIS — J690 Pneumonitis due to inhalation of food and vomit: Secondary | ICD-10-CM

## 2022-01-22 DIAGNOSIS — A419 Sepsis, unspecified organism: Secondary | ICD-10-CM | POA: Diagnosis not present

## 2022-01-22 DIAGNOSIS — R0602 Shortness of breath: Secondary | ICD-10-CM | POA: Diagnosis not present

## 2022-01-22 DIAGNOSIS — N186 End stage renal disease: Secondary | ICD-10-CM | POA: Diagnosis not present

## 2022-01-22 DIAGNOSIS — I5020 Unspecified systolic (congestive) heart failure: Secondary | ICD-10-CM | POA: Diagnosis not present

## 2022-01-22 LAB — GLUCOSE, CAPILLARY
Glucose-Capillary: 181 mg/dL — ABNORMAL HIGH (ref 70–99)
Glucose-Capillary: 95 mg/dL (ref 70–99)
Glucose-Capillary: 152 mg/dL — ABNORMAL HIGH (ref 70–99)
Glucose-Capillary: 171 mg/dL — ABNORMAL HIGH (ref 70–99)

## 2022-01-22 LAB — HEPARIN LEVEL (UNFRACTIONATED): Heparin Unfractionated: 0.28 IU/mL — ABNORMAL LOW (ref 0.30–0.70)

## 2022-01-22 LAB — HEPATITIS B SURFACE ANTIBODY, QUANTITATIVE: Hep B S AB Quant (Post): <3.1 m[IU]/mL — ABNORMAL LOW (ref >9.9)

## 2022-01-22 MED ORDER — HEPARIN BOLUS VIA INFUSION
1300.0000 [IU] | Freq: Once | INTRAVENOUS | Status: AC
  Administered 2022-01-22: 1300 [IU] via INTRAVENOUS
  Filled 2022-01-22: qty 1300

## 2022-01-22 MED ORDER — CLINDAMYCIN HCL 150 MG PO CAPS
300.0000 mg | ORAL_CAPSULE | Freq: Three times a day (TID) | ORAL | Status: AC
  Administered 2022-01-22 – 2022-01-27 (×15): 300 mg via ORAL
  Filled 2022-01-22 (×16): qty 2

## 2022-01-22 MED ORDER — IOHEXOL 350 MG/ML SOLN
50.0000 mL | Freq: Once | INTRAVENOUS | Status: AC | PRN
  Administered 2022-01-22: 50 mL via INTRAVENOUS

## 2022-01-22 NOTE — Progress Notes (Signed)
ANTICOAGULATION CONSULT NOTE - Follow up ? ?Pharmacy Consult for Heparin  ?Indication: chest pain/ACS ? ?Allergies  ?Allergen Reactions  ? Ace Inhibitors Swelling  ? Entresto [Sacubitril-Valsartan] Swelling  ?  Patient said she took both lisinopril and Entresto so it is not clear which one she actually reacted to.  For safety reasons, she has been advised to avoid both ACE inhibitors and Entresto.  ? Lisinopril Swelling  ?  Severe Angioedema (requiring Nasotracheal intubation)  ? Tomato   ? Penicillins Rash  ? ? ?Patient Measurements: ?Weight: 85.4 kg (188 lb 4.4 oz) ?Heparin Dosing Weight: 90.7 kg  ? ?Vital Signs: ?Temp: 98 ?F (36.7 ?C) (04/30 0357) ?Temp Source: Oral (04/30 0357) ?BP: 119/58 (04/30 0357) ?Pulse Rate: 80 (04/30 0357) ? ?Labs: ?Recent Labs  ?  01/20/22 ?1922 01/20/22 ?2123 01/21/22 ?0234 01/21/22 ?0329 01/21/22 ?0454 01/21/22 ?1445 01/21/22 ?2255 01/22/22 ?0507  ?HGB 11.1*  --  9.9*  --  9.1* 8.3*  --   --   ?HCT 37.8  --  33.7*  --  30.8* 27.1*  --   --   ?PLT 266  --  243  --  228 228  --   --   ?APTT 21*  --   --   --   --   --   --   --   ?LABPROT 13.6  --   --   --   --   --   --   --   ?INR 1.1  --   --   --   --   --   --   --   ?HEPARINUNFRC  --   --   --   --   --  0.46 0.30 0.28*  ?CREATININE 3.46*  --  3.56*  --  3.42*  --   --   --   ?TROPONINIHS 94* 229* 656* 619*  --   --   --   --   ? ? ?Estimated Creatinine Clearance: 18.2 mL/min (A) (by C-G formula based on SCr of 3.42 mg/dL (H)). ? ? ?Medical History: ?Past Medical History:  ?Diagnosis Date  ? Coronary artery disease   ? Diabetes mellitus without complication ([place])   ? Hypertension   ? Stroke [facility])   ? ? ? ?Assessment: ?Pharmacy consulted to dose heparin in this 71 year old female with sepsis, now ACS/NSTEMI.    ? ?CrCl = 17.5 ml/min  ? ?Goal of Therapy:  ?Heparin level 0.3-0.7 units/ml ?Monitor platelets by anticoagulation protocol: Yes ?  ?Relevant Results: ?4/29@ 1445  HL 0.46 - therapeutic x 1 @ 1200 units/hr ? ?Plan:  ?4/30:   HL @ 0507 = 0.28, subtherapeutic  ?Will order heparin 1300 units IV X 1 bolus and increase drip rate to 1400 units/hr.  ?Will recheck HL 8 hrs after rate change.  ? ?Patrece Tallie D ?01/22/2022 6:37 AM ? ? ?

## 2022-01-22 NOTE — Progress Notes (Addendum)
? ? ? ?Progress Note  ? ? ?Deanna [name]  SMO:[id number] DOB: [birth year]  DOA: 01/20/2022 ?PCP: [facility]  ? ? ? ? ?Brief Narrative:  ? ? ?Medical records reviewed and are as summarized below: ? ?Deanna [name] is a 71 y.o. 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  She presented to the hospital again on 01/20/2022 for shortness of breath and generalized weakness.  She did not go to outpatient dialysis on the day that she presented to the ED.  Her oxygen saturation was 88% on room air. ? ? ? ? ?Assessment/Plan:  ? ?Principal Problem: ?  SOB (shortness of breath) ?Active Problems: ?  HFrEF (heart failure with reduced ejection fraction) ([place]) ?  Severe sepsis ([place]) ?  Aspiration pneumonia ([place]) ?  Hypoglycemia associated with diabetes ([place]) ?  Type 2 diabetes mellitus without complications ([place]) ?  Elevated troponin ?  Chest pain ?  Essential (primary) hypertension ?  Anemia ?  End-stage renal disease on hemodialysis ([place]) ?  Shortness of breath ? ? ?Acute hypoxic respiratory failure: Oxygen requirement is down to 2 L/min.  Continue attempts to wean off oxygen.   ? ?Suspected severe sepsis from pneumonia, probable aspiration pneumonia: Discontinue IV antibiotics.  Start clindamycin.  (CT chest showed patulous appearance of the esophagus which is debris-filled and changes in the right lung base concerning for aspiration).  Consult speech therapist ? ?No evidence of acute pulmonary embolism on CTA of the chest.  Discontinue IV heparin drip.  ? ?Hypoglycemia in the setting of recent poor oral intake, type II DM: Glucose levels have stabilized.  Patient states she  takes NovoLog 15 units in the morning, 20 units in the afternoon and 25 units in the evening, she also takes Lantus 65 units nightly.  She has not had problems with this regimen for years.  She said she had hypoglycemia because she did not eat on the day of admission. ? ?ESRD, secondary hyperparathyroidism, anemia of chronic disease: Follow-up with nephrologist for hemodialysis. ? ?Elevated troponin: This is likely from demand ischemia.  No chest pain. ? ?Chronic systolic and diastolic CHF probably with acute exacerbation from missed outpatient dialysis.   ? ?Patient insists that she does not want to go to SNF and she does not want to go to live with her daughters in [place].  She prefers to go back to live with her granddaughter.  Consult PT and OT ? ?Other comorbidities include CAD, hypertension, hyperlipidemia, recent intubation for angioedema from lisinopril/Entresto ? ? ?Diet Order   ? ?       ?  Diet renal/carb modified with fluid restriction Diet-HS Snack? Nothing; Fluid restriction: 1200 mL Fluid; Room service appropriate? Yes; Fluid consistency: Thin  Diet effective now       ?  ? ?  ?  ? ?  ? ? ? ? ? ? ? ? ?Consultants: ?Nephrologist ? ?Procedures: ?None ? ? ? ?Medications:  ? ? aspirin EC  81 mg Oral Daily  ? atorvastatin  40 mg Oral Daily  ? Chlorhexidine Gluconate Cloth  6 each Topical Q0600  ? clindamycin  300 mg Oral Q8H  ? clopidogrel  75 mg Oral Daily  ? dextrose  1 ampule Intravenous Once  ?  ferrous sulfate  325 mg Oral q AM  ? gabapentin  200 mg Oral TID  ? insulin aspart  0-6 Units Subcutaneous TID WC  ? metoprolol tartrate  25 mg Oral BID  ? sodium chloride flush  3 mL Intravenous Q12H  ? ?Continuous Infusions: ? ? ? ? ?Anti-infectives (From admission, onward)  ? ? Start     Dose/Rate Route Frequency Ordered Stop  ? 01/23/22 1200  vancomycin (VANCOCIN) IVPB 1000 mg/200 mL premix  Status:  Discontinued       ? 1,000 mg ?200 mL/hr over 60 Minutes Intravenous Every M-W-F (Hemodialysis) 01/20/22  2322 01/22/22 1201  ? 01/22/22 1400  clindamycin (CLEOCIN) capsule 300 mg       ? 300 mg Oral Every 8 hours 01/22/22 1202 01/27/22 1359  ? 01/21/22 1600  vancomycin (VANCOCIN) IVPB 1000 mg/200 mL premix       ? 1,000 mg ?200 mL/hr over 60 Minutes Intravenous  Once 01/21/22 1354 01/21/22 1858  ? 01/20/22 2315  ceFEPIme (MAXIPIME) 1 g in sodium chloride 0.9 % 100 mL IVPB  Status:  Discontinued       ? 1 g ?200 mL/hr over 30 Minutes Intravenous Every 24 hours 01/20/22 2305 01/22/22 1201  ? 01/20/22 2300  vancomycin (VANCOCIN) IVPB 1000 mg/200 mL premix  Status:  Discontinued       ? 1,000 mg ?200 mL/hr over 60 Minutes Intravenous  Once 01/20/22 2254 01/20/22 2257  ? 01/20/22 2300  vancomycin (VANCOREADY) IVPB 750 mg/150 mL       ? 750 mg ?150 mL/hr over 60 Minutes Intravenous  Once 01/20/22 2258 01/21/22 0430  ? 01/20/22 1945  vancomycin (VANCOCIN) IVPB 1000 mg/200 mL premix       ? 1,000 mg ?200 mL/hr over 60 Minutes Intravenous  Once 01/20/22 1937 01/20/22 2122  ? 01/20/22 1945  ceFEPIme (MAXIPIME) 2 g in sodium chloride 0.9 % 100 mL IVPB       ? 2 g ?200 mL/hr over 30 Minutes Intravenous  Once 01/20/22 1937 01/20/22 2018  ? ?  ? ? ? ? ? ? ? ? ? ?Family Communication/Anticipated D/C date and plan/Code Status  ? ?DVT prophylaxis:  ? ? ?  Code Status: Full Code ? ?Family Communication: I called [name], granddaughter and healthcare power of attorney at 4:54 PM today.  There was no response.  I left a voice message for her to call back. ?Disposition Plan: Plan to discharge home tomorrow ? ? ? ? ? ? ? ? ?Subjective:  ? ?Interval events noted.  No shortness of breath, cough or chest pain ? ?Objective:  ? ? ?Vitals:  ? 01/22/22 0357 01/22/22 0500 01/22/22 0800 01/22/22 1134  ?BP: (!) 119/58  (!) 121/54 128/63  ?Pulse: 80  92 82  ?Resp: 19  19   ?Temp: 98 ?F (36.7 ?C)  97.9 ?F (36.6 ?C)   ?TempSrc: Oral  Oral   ?SpO2: 100%  98% 100%  ?Weight:  85.4 kg    ? ?No data found. ? ? ?Intake/Output Summary (Last 24 hours) at  01/22/2022 1245 ?Last data filed at 01/21/2022 1539 ?Gross per 24 hour  ?Intake --  ?Output 1000 ml  ?Net -1000 ml  ? ?Filed Weights  ? 01/22/22 0500  ?Weight: 85.4 kg  ? ? ?Exam: ? ?GEN: NAD ?SKIN: Warm and dry ?EYES: EOMI ?ENT: MMM ?CV: RRR ?PULM: CTA B ?ABD: soft, ND, NT, +BS ?CNS: AAO x 3, non focal ?EXT: No edema or tenderness ? ? ? ? ?  ? ? ?  Data Reviewed:  ? ?I have personally reviewed following labs and imaging studies: ? ?Labs: ?Labs show the following:  ? ?Basic Metabolic Panel: ?Recent Labs  ?Lab 01/16/22 ?0350 01/16/22 ?2836 01/17/22 ?6294 01/17/22 ?1637 01/18/22 ?0500 01/19/22 ?0530 01/20/22 ?1922 01/21/22 ?0234 01/21/22 ?0454  ?NA 137  --  131*  --  135 135 139 138 137  ?K 6.3*   < > 4.2  --  4.9 3.7 4.7 3.6 3.7  ?CL 104  --  96*  --  96* 99 101 102 100  ?CO2 23  --  26  --  25 27 21* 26 27  ?GLUCOSE 146*  --  166*  --  192* 224* 120* 49* 115*  ?BUN 53*  --  36*  --  48* 31* 50* 52* 52*  ?CREATININE 3.45*  --  2.70*  --  3.31* 2.32* 3.46* 3.56* 3.42*  ?CALCIUM 8.3*  --  7.9*  --  7.7* 7.7* 8.8* 8.4* 8.2*  ?MG 1.7  --  1.6* 2.0 2.0 1.7  --   --  1.8  ?PHOS 6.6*  --  5.2*  --  6.2* 3.7  --  5.6*  --   ? < > = values in this interval not displayed.  ? ?GFR ?Estimated Creatinine Clearance: 18.2 mL/min (A) (by C-G formula based on SCr of 3.42 mg/dL (H)). ?Liver Function Tests: ?Recent Labs  ?Lab 01/17/22 ?7654 01/20/22 ?1922 01/21/22 ?0234 01/21/22 ?0454  ?AST  --  69*  --  72*  ?ALT  --  81*  --  76*  ?ALKPHOS  --  394*  --  314*  ?BILITOT  --  0.9  --  0.6  ?PROT  --  7.3  --  7.1  ?ALBUMIN 2.4* 2.9* 2.7* 2.6*  ? ?No results for input(s): LIPASE, AMYLASE in the last 168 hours. ?No results for input(s): AMMONIA in the last 168 hours. ?Coagulation profile ?Recent Labs  ?Lab 01/20/22 ?1922  ?INR 1.1  ? ? ?CBC: ?Recent Labs  ?Lab 01/18/22 ?0500 01/19/22 ?0530 01/20/22 ?1922 01/21/22 ?0234 01/21/22 ?0454 01/21/22 ?1445  ?WBC 11.7* 9.4 23.2* 13.5* 12.5* 9.1  ?NEUTROABS 10.3* 7.9* 19.2*  --   --   --   ?HGB 9.0*  8.8* 11.1* 9.9* 9.1* 8.3*  ?HCT 30.1* 28.8* 37.8 33.7* 30.8* 27.1*  ?MCV 88.5 88.3 92.0 91.1 90.3 86.0  ?PLT 234 225 266 243 228 228  ? ?Cardiac Enzymes: ?No results for input(s): CKTOTAL, CKMB, CKMBINDEX, TR

## 2022-01-22 NOTE — Progress Notes (Signed)
PT Cancellation Note ? ?Patient Details ?Name: Deanna [name] ?MRN: [id number] ?DOB: 1951-05-11 ? ? ?Cancelled Treatment:    Reason Eval/Treat Not Completed: Fatigue/lethargy limiting ability to participate (Patient refused due to fatigue. She is agreeable for PT to return tomorrow. She does report she wants to return home at discharge.) PT will continue with attempts.  ? ?Minna Merritts, PT, MPT ? ?Percell Locus ?01/22/2022, 2:09 PM ?

## 2022-01-22 NOTE — Evaluation (Signed)
Occupational Therapy Evaluation ?Patient Details ?Name: Deanna [name] ?MRN: [id number] ?DOB: November 26, 1950 ?Today's Date: 01/22/2022 ? ? ?History of Present Illness Deanna [name] is a 71 y.o. female with medical history significant of heart disease, diabetes mellitus type 2, hypertension, history of stroke, end-stage renal disease on hemodialysis heart failure with reduced ejection fraction, angioedema coming to [place] with shortness of breath that started earlier today with generalized weakness and fever.  Patient on initial evaluation in the emergency room meet sepsis criteria with unclear source of infection.  ? ?Clinical Impression ?  ?Deanna [name] presents with generalized weakness, impaired balance, and limited endurance. Her O2 sats remained at 96% or higher throughout session. Pt unsteady in standing with limited standing balance tolerance. She reports numerous falls at home; states that initiating HD has been very tiring for her. Pt reports that her granddaughter, with whom she lives, is moving into a house this coming week, with no STE. Pt is looking forward to this, as having to get up and down stairs of 2nd floor apt has been difficult for pt and has limited her ability to leave the home. Recommend ongoing OT services throughout hospitalization, to assist pt with regaining strength, balance, and fxl mobility, with HHOT post DC.  ? ?Recommendations for follow up therapy are one component of a multi-disciplinary discharge planning process, led by the attending physician.  Recommendations may be updated based on patient status, additional functional criteria and insurance authorization.  ? ?Follow Up Recommendations ? Home health OT  ?  ?Assistance Recommended at Discharge Frequent or constant Supervision/Assistance  ?Patient can return home with the following Help with stairs or ramp for entrance;Assistance with cooking/housework;A little help with walking and/or transfers;A little help with  bathing/dressing/bathroom ? ?  ?Functional Status Assessment ? Patient has had a recent decline in their functional status and demonstrates the ability to make significant improvements in function in a reasonable and predictable amount of time.  ?Equipment Recommendations ? None recommended by OT  ?  ?Recommendations for Other Services   ? ? ?  ?Precautions / Restrictions Precautions ?Precautions: Fall ?Restrictions ?Weight Bearing Restrictions: No  ? ?  ? ?Mobility Bed Mobility ?Overal bed mobility: Needs Assistance ?Bed Mobility: Supine to Sit, Sit to Supine ?  ?  ?Supine to sit: Supervision ?Sit to supine: Supervision ?  ?General bed mobility comments: increased time, effort. AAROM for repositioning RLE ?  ? ?Transfers ?Overall transfer level: Needs assistance ?Equipment used: Rolling walker (2 wheels) ?Transfers: Sit to/from Stand ?Sit to Stand: Min guard ?  ?  ?  ?  ?  ?General transfer comment: cueing for hand positioning on RW, w/ limited carryover between trials ?  ? ?  ?Balance Overall balance assessment: Needs assistance ?Sitting-balance support: No upper extremity supported, Feet supported ?Sitting balance-[name]: Good ?  ?Postural control: Posterior lean, Right lateral lean ?Standing balance support: Bilateral upper extremity supported, During functional activity ?Standing balance-[name]: Fair ?Standing balance comment: reliant on walker, poor standing tolerance ?  ?  ?  ?  ?  ?  ?  ?  ?  ?  ?  ?   ? ?ADL either performed or assessed with clinical judgement  ? ?ADL Overall ADL's : Needs assistance/impaired ?Eating/Feeding: Modified independent ?  ?Grooming: Wash/dry hands;Wash/dry face;Sitting;Bed level;Set up;Modified independent ?  ?  ?  ?  ?  ?  ?  ?Lower Body Dressing: Minimal assistance ?Lower Body Dressing Details (indicate cue type and reason): donning/doffing socks ?  ?  ?  ?  ?  ?  ?  Functional mobility during ADLs: Minimal assistance ?General ADL Comments: Min A + RW for OOB fxl  mobility  ? ? ? ?Vision   ?   ?   ?Perception   ?  ?Praxis   ?  ? ?Pertinent Vitals/Pain Pain Assessment ?Pain Assessment: No/denies pain  ? ? ? ?Hand Dominance Right ?  ?Extremity/Trunk Assessment Upper Extremity Assessment ?Upper Extremity Assessment: Overall WFL for tasks assessed ?  ?Lower Extremity Assessment ?Lower Extremity Assessment: Generalized weakness;RLE deficits/detail ?RLE Deficits / Details: uses b/l UE for reposition RLE during bed mobility ?RLE Coordination: decreased fine motor;decreased gross motor ?  ?  ?  ?Communication Communication ?Communication: No difficulties ?  ?Cognition Arousal/Alertness: Awake/alert ?Behavior During Therapy: [facility] for tasks assessed/performed ?Overall Cognitive Status: Within Functional Limits for tasks assessed ?  ?  ?  ?  ?  ?  ?  ?  ?  ?  ?  ?  ?  ?  ?  ?  ?General Comments: easily distracted, no eye contact ?  ?  ?General Comments    ? ?  ?Exercises Other Exercises ?Other Exercises: Educ re: home safety, falls prevention, DC recs ?  ?Shoulder Instructions    ? ? ?Home Living Family/patient expects to be discharged to:: Private residence ?Living Arrangements: Other relatives;Children ?Available Help at Discharge: Family;Available 24 hours/day ?Type of Home: Apartment ?Home Access: Stairs to enter ?Entrance Stairs-Number of Steps: 14 ?Entrance Stairs-Rails: Right ?Home Layout: One level ?  ?  ?Bathroom Shower/Tub: Tub/shower unit ?  ?Bathroom Toilet: Standard ?  ?  ?Home Equipment: Associate Professor (4 wheels);Rolling Walker (2 wheels) ?  ?  ?  ? ?  ?Prior Functioning/Environment Prior Level of Function : Needs assist ?  ?  ?  ?  ?  ?  ?Mobility Comments: Pt reports that she always has someone with her but does not always need phyiscal assist, reports functional mobility of short distances (~92f) with RW, and heavy assist stair navigation. Pt endorses countless falls (d/t knee buckling and "passing out"). Pt reports that her caregivers have fallen when  providing assistance during mobility ?ADLs Comments: At baseline, pt is independent with feeding, seated grooming, seated UB dressing, and seated peri-care. Family assists with LB dressing, bathing, and toilet transfers ?  ? ?  ?  ?OT Problem List: Decreased strength;Impaired balance (sitting and/or standing);Decreased safety awareness;Impaired sensation;Decreased activity tolerance;Decreased coordination ?  ?   ?OT Treatment/Interventions: Self-care/ADL training;Therapeutic exercise;Neuromuscular education;DME and/or AE instruction;Therapeutic activities;Patient/family education;Balance training  ?  ?OT Goals(Current goals can be found in the care plan section) Acute Rehab OT Goals ?Patient Stated Goal: to go home ?OT Goal Formulation: With patient ?Time For Goal Achievement: 02/05/22 ?Potential to Achieve Goals: Good ?ADL Goals ?Pt Will Perform Grooming: with supervision;with set-up;standing;sitting (in standing as able) ?Pt Will Transfer to Toilet: with supervision;regular height toilet (using LRAD) ?Pt Will Perform Tub/Shower Transfer: with supervision;Stand pivot transfer;shower seat;rolling walker  ?OT Frequency: Min 2X/week ?  ? ?Co-evaluation   ?  ?  ?  ?  ? ?  ?AM-PAC OT "6 Clicks" Daily Activity     ?Outcome Measure Help from another person eating meals?: None ?Help from another person taking care of personal grooming?: A Little ?Help from another person toileting, which includes using toliet, bedpan, or urinal?: A Little ?Help from another person bathing (including washing, rinsing, drying)?: A Little ?Help from another person to put on and taking off regular upper body clothing?: A Little ?Help from another person to put on  and taking off regular lower body clothing?: A Little ?6 Click Score: 19 ?  ?End of Session Equipment Utilized During Treatment: Rolling walker (2 wheels) ? ?Activity Tolerance: Patient tolerated treatment well ?Patient left: in bed;with call bell/phone within reach;with bed alarm  set ? ?OT Visit Diagnosis: History of falling (Z91.81);Muscle weakness (generalized) (M62.81) ?Hemiplegia - Right/Left: Right ?Hemiplegia - caused by: Cerebral infarction  ?              ?Time: 7322-5672 ?OT Time Calculation (min): 21 mi

## 2022-01-22 NOTE — Progress Notes (Signed)
SLP Cancellation Note ? ?Patient Details ?Name: Deanna [name] ?MRN: [id number] ?DOB: Jan 11, 1951 ? ? ?Cancelled treatment:       Reason Eval/Treat Not Completed: Fatigue/lethargy limiting ability to participate  ? ?SLP consult received and appreciated. Chart review completed. Pt currently refusing to participate in SLP services due to fatigue. Will continue efforts as appropriate. ? ?Cherrie Gauze, M.S., CCC-SLP ?Speech-Language Pathologist ?[facility] ?[phone]) ? ?[name] ?01/22/2022, 2:30 PM ?

## 2022-01-22 NOTE — Progress Notes (Signed)
?[facility]  ?PROGRESS NOTE  ? ?Subjective:  ? ?Patient seen at bedside.  Much more comfortable today ?Able to communicate well.  Had a CT angio of the chest which was negative for pulmonary embolism. ? ?Objective:  ?Vital signs: ?Blood pressure 128/63, pulse 82, temperature 97.9 ?F (36.6 ?C), temperature source Oral, resp. rate 19, weight 85.4 kg, SpO2 100 %. ? ?Intake/Output Summary (Last 24 hours) at 01/22/2022 1307 ?Last data filed at 01/21/2022 1539 ?Gross per 24 hour  ?Intake --  ?Output 1000 ml  ?Net -1000 ml  ? ?Filed Weights  ? 01/22/22 0500  ?Weight: 85.4 kg  ? ? ? ?Physical Exam: ?General:  No acute distress  ?Head:  Normocephalic, atraumatic. Moist oral mucosal membranes  ?Eyes:  Anicteric  ?Neck:  Supple  ?Lungs:   Clear to auscultation, normal effort  ?Heart:  S1S2 no rubs  ?Abdomen:   Soft, nontender, bowel sounds present  ?Extremities:  peripheral edema.  ?Neurologic:  Awake, alert, following commands  ?Skin:  No lesions  ?Access:   ? ? ?Basic Metabolic Panel: ?Recent Labs  ?Lab 01/16/22 ?0350 01/16/22 ?4742 01/17/22 ?5956 01/17/22 ?1637 01/18/22 ?0500 01/19/22 ?0530 01/20/22 ?1922 01/21/22 ?0234 01/21/22 ?0454  ?NA 137  --  131*  --  135 135 139 138 137  ?K 6.3*   < > 4.2  --  4.9 3.7 4.7 3.6 3.7  ?CL 104  --  96*  --  96* 99 101 102 100  ?CO2 23  --  26  --  25 27 21* 26 27  ?GLUCOSE 146*  --  166*  --  192* 224* 120* 49* 115*  ?BUN 53*  --  36*  --  48* 31* 50* 52* 52*  ?CREATININE 3.45*  --  2.70*  --  3.31* 2.32* 3.46* 3.56* 3.42*  ?CALCIUM 8.3*  --  7.9*  --  7.7* 7.7* 8.8* 8.4* 8.2*  ?MG 1.7  --  1.6* 2.0 2.0 1.7  --   --  1.8  ?PHOS 6.6*  --  5.2*  --  6.2* 3.7  --  5.6*  --   ? < > = values in this interval not displayed.  ? ? ?CBC: ?Recent Labs  ?Lab 01/18/22 ?0500 01/19/22 ?0530 01/20/22 ?1922 01/21/22 ?0234 01/21/22 ?0454 01/21/22 ?1445  ?WBC 11.7* 9.4 23.2* 13.5* 12.5* 9.1  ?NEUTROABS 10.3* 7.9* 19.2*  --   --   --   ?HGB 9.0* 8.8* 11.1* 9.9* 9.1* 8.3*  ?HCT 30.1* 28.8* 37.8  33.7* 30.8* 27.1*  ?MCV 88.5 88.3 92.0 91.1 90.3 86.0  ?PLT 234 225 266 243 228 228  ? ? ? ?Urinalysis: ?No results for input(s): COLORURINE, LABSPEC, [place], GLUCOSEU, HGBUR, BILIRUBINUR, KETONESUR, PROTEINUR, UROBILINOGEN, NITRITE, LEUKOCYTESUR in the last 72 hours. ? ?Invalid input(s): APPERANCEUR  ? ? ?Imaging: ?CT Head Wo Contrast ? ?Result Date: 01/21/2022 ?CLINICAL DATA:  Generalized weakness, mental status change EXAM: CT HEAD WITHOUT CONTRAST TECHNIQUE: Contiguous axial images were obtained from the base of the skull through the vertex without intravenous contrast. RADIATION DOSE REDUCTION: This exam was performed according to the departmental dose-optimization program which includes automated exposure control, adjustment of the mA and/or kV according to patient size and/or use of iterative reconstruction technique. COMPARISON:  12/26/2021 FINDINGS: Brain: No evidence of acute infarction, hemorrhage, cerebral edema, mass, mass effect, or midline shift. No hydrocephalus or extra-axial fluid collection. Periventricular white matter changes, likely the sequela of chronic small vessel ischemic disease. Cerebral volume is within normal limits  for age. Vascular: No hyperdense vessel. Skull: Normal. Negative for fracture or focal lesion. Sinuses/Orbits: No acute finding. Other: Fluid throughout the right mastoid air cells and right middle ear, which is new from the prior exam. Debris in the right external auditory canal, likely cerumen. Trace fluid is also noted in the left mastoid air cells. No definite fluid in the left middle ear. IMPRESSION: IMPRESSION 1.  No acute intracranial process. 2. Fluid throughout the right mastoid air cells and right middle ear, which is new from the prior exam. Correlate for symptoms of otitis media. Electronically Signed   By: Merilyn Baba M.D.   On: 01/21/2022 00:58  ? ?CT Angio Chest Pulmonary Embolism (PE) W or WO Contrast ? ?Result Date: 01/22/2022 ?CLINICAL DATA:  Pulmonary  embolism, suspected pulmonary embolism. EXAM: CT ANGIOGRAPHY CHEST WITH CONTRAST TECHNIQUE: Multidetector CT imaging of the chest was performed using the standard protocol during bolus administration of intravenous contrast. Multiplanar CT image reconstructions and MIPs were obtained to evaluate the vascular anatomy. RADIATION DOSE REDUCTION: This exam was performed according to the departmental dose-optimization program which includes automated exposure control, adjustment of the mA and/or kV according to patient size and/or use of iterative reconstruction technique. CONTRAST:  88m OMNIPAQUE IOHEXOL 350 MG/ML SOLN COMPARISON:  Comparison made with January 07, 2022. FINDINGS: Cardiovascular: RIGHT IJ dialysis catheter terminates in the area of the caval to atrial junction. Heart size remains enlarged without pericardial effusion. Signs of three-vessel coronary artery disease. Main pulmonary artery is well opacified with maximal opacification is 616 Hounsfield units. This study is negative for pulmonary embolism. Mediastinum/Nodes: Mediastinal nodal enlargement is unchanged. (Image 63/5) 17 18 mm subcarinal lymph node. (Image 58/5) 14 mm RIGHT paratracheal lymph node. AP window nodal tissue similar to previous imaging as well. Mild fullness of RIGHT hilar nodal tissue is stable. Debris in the esophagus which is patulous showing a similar appearance to the prior study. Lungs/Pleura: Dense consolidative changes with parenchymal calcification at the RIGHT lung base. Slight increasing consolidative changes in the medial portion of the RIGHT lower lobe when compared to previous imaging. Mild septal thickening persists. Patchy ground-glass seen on the April 15 study has largely resolved. Basilar atelectasis on the LEFT. Airways are patent. RIGHT pleural effusion is similar to prior imaging. Upper Abdomen: Reflux of contrast into hepatic veins from the RIGHT atrium. Incidental imaging of upper abdominal contents without  acute process. Musculoskeletal: No acute bone finding. No destructive bone process. Spinal degenerative changes. Review of the MIP images confirms the above findings. IMPRESSION: 1. Negative for pulmonary embolism. 2. Dense consolidation in the RIGHT lower lobe associated with parenchymal calcification slightly worse in the medial aspect of the RIGHT lung base. Findings could reflect sequela of aspiration with interval worsening given that there is a patulous appearance of the esophagus which is debris filled. 3. Largely stable appearance of partially loculated RIGHT-sided pleural effusion 4. Cardiomegaly with signs of cardiac dysfunction given reflux of contrast into hepatic veins. 5. Nodal enlargement in the chest is nonspecific and could be reactive and at least partially related to cardiac dysfunction. Given size of these lymph nodes would however suggest 6-8 week follow-up to ensure improvement and or resolution. Aortic Atherosclerosis (ICD10-I70.0). Electronically Signed   By: GZetta BillsM.D.   On: 01/22/2022 10:56  ? ?NM Pulmonary Perfusion ? ?Result Date: 01/21/2022 ?CLINICAL DATA:  Dyspnea. High probability for pulmonary embolus clinically. EXAM: NUCLEAR MEDICINE PERFUSION LUNG SCAN TECHNIQUE: Perfusion images were obtained in multiple projections after intravenous  injection of radiopharmaceutical. Ventilation scans intentionally deferred if perfusion scan and chest x-ray adequate for interpretation during COVID 19 epidemic. RADIOPHARMACEUTICALS:  4.1 mCi Tc-1mMAA IV COMPARISON:  Chest x-ray 01/12/2022 FINDINGS: Heterogeneous decreased perfusion noted in the lung apices. There is marked decreased perfusion diffusely to the right lower lobe with heterogeneous decreased perfusion to the left lower lobe. Patient noted to have diffuse airspace disease bilaterally on yesterday's chest x-ray with confluent disease in the lung bases. IMPRESSION: Study considered indeterminate for pulmonary embolus with  diffusely heterogeneous decreased perfusion in the right lower lung and both lung apices given the extensive airspace disease on yesterday's chest x-ray. Electronically Signed   By: EMisty StanleyM.D.   On: 04/29

## 2022-01-23 ENCOUNTER — Inpatient Hospital Stay: Payer: Medicare HMO

## 2022-01-23 DIAGNOSIS — J690 Pneumonitis due to inhalation of food and vomit: Secondary | ICD-10-CM | POA: Diagnosis not present

## 2022-01-23 DIAGNOSIS — N186 End stage renal disease: Secondary | ICD-10-CM | POA: Diagnosis not present

## 2022-01-23 DIAGNOSIS — E11649 Type 2 diabetes mellitus with hypoglycemia without coma: Secondary | ICD-10-CM | POA: Diagnosis not present

## 2022-01-23 DIAGNOSIS — R0602 Shortness of breath: Secondary | ICD-10-CM | POA: Diagnosis not present

## 2022-01-23 LAB — RENAL FUNCTION PANEL
Albumin: 2.3 g/dL — ABNORMAL LOW (ref 3.5–5.0)
Anion gap: 11 (ref 5–15)
BUN: 41 mg/dL — ABNORMAL HIGH (ref 8–23)
CO2: 27 mmol/L (ref 22–32)
Calcium: 7.9 mg/dL — ABNORMAL LOW (ref 8.9–10.3)
Chloride: 96 mmol/L — ABNORMAL LOW (ref 98–111)
Creatinine, Ser: 3.16 mg/dL — ABNORMAL HIGH (ref 0.44–1.00)
GFR, Estimated: 15 mL/min — ABNORMAL LOW (ref >60)
Glucose, Bld: 182 mg/dL — ABNORMAL HIGH (ref 70–99)
Phosphorus: 5.3 mg/dL — ABNORMAL HIGH (ref 2.5–4.6)
Potassium: 4 mmol/L (ref 3.5–5.1)
Sodium: 134 mmol/L — ABNORMAL LOW (ref 135–145)

## 2022-01-23 LAB — CBC
HCT: 28.5 % — ABNORMAL LOW (ref 36.0–46.0)
Hemoglobin: 8.6 g/dL — ABNORMAL LOW (ref 12.0–15.0)
MCH: 27 pg (ref 26.0–34.0)
MCHC: 30.2 g/dL (ref 30.0–36.0)
MCV: 89.3 fL (ref 80.0–100.0)
Platelets: 302 10*3/uL (ref 150–400)
RBC: 3.19 MIL/uL — ABNORMAL LOW (ref 3.87–5.11)
RDW: 15.2 % (ref 11.5–15.5)
WBC: 6.6 10*3/uL (ref 4.0–10.5)
nRBC: 0 % (ref 0.0–0.2)

## 2022-01-23 LAB — GLUCOSE, CAPILLARY
Glucose-Capillary: 112 mg/dL — ABNORMAL HIGH (ref 70–99)
Glucose-Capillary: 177 mg/dL — ABNORMAL HIGH (ref 70–99)
Glucose-Capillary: 88 mg/dL (ref 70–99)
Glucose-Capillary: 129 mg/dL — ABNORMAL HIGH (ref 70–99)
Glucose-Capillary: 131 mg/dL — ABNORMAL HIGH (ref 70–99)
Glucose-Capillary: 279 mg/dL — ABNORMAL HIGH (ref 70–99)
Glucose-Capillary: 217 mg/dL — ABNORMAL HIGH (ref 70–99)

## 2022-01-23 MED ORDER — HEPARIN SODIUM (PORCINE) 5000 UNIT/ML IJ SOLN
5000.0000 [IU] | Freq: Three times a day (TID) | INTRAMUSCULAR | Status: DC
  Administered 2022-01-23 – 2022-01-27 (×11): 5000 [IU] via SUBCUTANEOUS
  Filled 2022-01-23 (×11): qty 1

## 2022-01-23 NOTE — Evaluation (Signed)
Physical Therapy Evaluation ?Patient Details ?Name: Deanna [name] ?MRN: [id number] ?DOB: 09/23/1951 ?Today's Date: 01/23/2022 ? ?History of Present Illness ? Pt is a 71 y.o. female presenting to hospital 4/28 with c/o SOB associated with generalized weakness and fever.  Recent hospital admission and discharge for angioedema requiring intubation.  Pt now admitted with acute hypoxic respiratory failure, suspected sepsis from PNA, hypoglycemia, elevated troponin (likely from demand ischemia), and chronic systolic and diastolic CHF.  PMH includes CHF, CAD, DM, HLD, htn, ESRD on dialysis, anemia, and stroke.  ?Clinical Impression ? Prior to hospital admission, pt was ambulatory household distances with RW (pt always has someone with her when walking) and lives on main level of home (level entry)--pt uses BSC d/t no bathroom on main level (pt reports unable to go upstairs anymore d/t difficulty managing steps); lives with her granddaughter (and granddaughter's 7 children--oldest is 56 y.o.).  Pt's phone rang as soon as therapist entered pt's room and introduced herself; pt started talking with her granddaughter and then asked therapist to talk to her granddaughter.  Pt's granddaughter requesting hospital bed (d/t pt SOB laying flat in bed) and also power w/c (d/t pt's impaired mobility).  Currently pt is SBA with bed mobility (HOB elevated and use of bed rail); CGA with transfers using RW; and CGA ambulating 30 feet with RW use (pt sliding R LE on floor to advance--pt reports h/o this d/t h/o stroke; R LE strength impairments noted--pt reports d/t h/o stroke).  Discussed with pt difference between manual w/c vs power w/c (benefits and limitations)--anticipate manual w/c could be appropriate for pt within home but pt requesting power w/c instead to improve mobility within and outside of home for appts/dialysis (pt reports her insurance said they would cover power w/c but needed information submitted by PCP--pt reports  already talking with her PCP regarding getting power w/c and is currently waiting for PCP to contact insurance company (note of necessity)--therapist encouraged pt to contact PCP to follow-up on current status of power w/c request).  Pt would benefit from skilled PT to address noted impairments and functional limitations (see below for any additional details).  Upon hospital discharge, pt would benefit from Protection.   ? ?Recommendations for follow up therapy are one component of a multi-disciplinary discharge planning process, led by the attending physician.  Recommendations may be updated based on patient status, additional functional criteria and insurance authorization. ? ?Follow Up Recommendations Home health PT ? ?  ?Assistance Recommended at Discharge Frequent or constant Supervision/Assistance  ?Patient can return home with the following ? A little help with walking and/or transfers;A little help with bathing/dressing/bathroom;Assistance with cooking/housework;Assist for transportation;Help with stairs or ramp for entrance ? ?  ?Equipment Recommendations  (Pt and pt's granddaughter requesting hospital bed (d/t SOB laying flat) and power w/c (d/t pt's impaired mobility); pt also requesting rollator (so able to sit down when needed d/t impaired mobility))  ?Recommendations for Other Services ?    ?  ?Functional Status Assessment Patient has had a recent decline in their functional status and demonstrates the ability to make significant improvements in function in a reasonable and predictable amount of time.  ? ?  ?Precautions / Restrictions Precautions ?Precautions: Fall ?Restrictions ?Weight Bearing Restrictions: No  ? ?  ? ?Mobility ? Bed Mobility ?Overal bed mobility: Needs Assistance ?Bed Mobility: Supine to Sit, Sit to Supine ?  ?  ?Supine to sit: Supervision, HOB elevated ?Sit to supine: Supervision, HOB elevated ?  ?General bed mobility comments: mild  increased effort to perform on own; use of bed rail ?   ? ?Transfers ?Overall transfer level: Needs assistance ?Equipment used: Rolling walker (2 wheels) ?Transfers: Sit to/from Stand ?Sit to Stand: Min guard ?  ?  ?  ?  ?  ?General transfer comment: vc's for UE placement; mild increased effort to stand from bed; vc's to line up to bed prior to sitting ?  ? ?Ambulation/Gait ?Ambulation/Gait assistance: Min guard ?Gait Distance (Feet): 40 Feet ?Assistive device: Rolling walker (2 wheels) ?  ?Gait velocity: decreased ?  ?  ?General Gait Details: step to gait pattern; pt tending to slide R LE on floor to advance with R LE externally rotated; decreased stance time R LE; intermittent vc's for upright posture ? ?Stairs ?  ?  ?  ?  ?  ? ?Wheelchair Mobility ?  ? ?Modified Rankin (Stroke Patients Only) ?  ? ?  ? ?Balance Overall balance assessment: Needs assistance ?Sitting-balance support: No upper extremity supported, Feet supported ?Sitting balance-[name]: Good ?Sitting balance - Comments: steady sitting reaching within BOS ?  ?Standing balance support: Bilateral upper extremity supported, During functional activity ?Standing balance-[name]: Fair ?Standing balance comment: requiring B UE support on RW but overall steady ?  ?  ?  ?  ?  ?  ?  ?  ?  ?  ?  ?   ? ? ? ?Pertinent Vitals/Pain Pain Assessment ?Pain Assessment: No/denies pain ?Pain Intervention(s): Limited activity within patient's tolerance, Monitored during session, Repositioned ?Vitals (HR and O2 on room air) stable and WFL throughout treatment session.  ? ? ?Home Living Family/patient expects to be discharged to:: Private residence ?Living Arrangements: Other relatives;Children (Pt's granddaughter and granddaughter's 7 kids (all under 13 y.o.)) ?Available Help at Discharge: Family (Pt reports having 24/7 assist but appears this includes granddaughter's children who are all under 47 y.o. (oldest is 70 y.o.)) ?Type of Home: Apartment ?Home Access: Stairs to enter ?  ?  ?Alternate Level Stairs-Number of  Steps: 14 (to 2nd floor bathroom and bedroom) ?Home Layout: Two level;Other (Comment) (stays on main level but no bathroom (uses BSC and sleeps in regular bed)) ?  ?Additional Comments: Pt reports she has stayed on main level of home for at least last month so she would not have to do any steps.  ?  ?Prior Function Prior Level of Function : Needs assist ?  ?  ?  ?  ?  ?  ?Mobility Comments: Pt reports preferring to be independent but always has family member with her when mobilizing (h/o falls); ambulates with RW; uses BSC on main level of home ?ADLs Comments: Pt takes own sponge baths. ?  ? ? ?Hand Dominance  ?   ? ?  ?Extremity/Trunk Assessment  ? Upper Extremity Assessment ?Upper Extremity Assessment: Defer to OT evaluation;Overall [facility] for tasks assessed ?  ? ?Lower Extremity Assessment ?Lower Extremity Assessment: Generalized weakness;RLE deficits/detail ?RLE Deficits / Details: DF 2+/5; knee extension 2+/5; knee flexion 4/5; hip flexion 2+/5 (baseline per pt d/t h/o stroke) ?RLE Coordination: decreased fine motor;decreased gross motor ?  ? ?Cervical / Trunk Assessment ?Cervical / Trunk Assessment: Kyphotic  ?Communication  ? Communication: No difficulties  ?Cognition Arousal/Alertness: Awake/alert ?Behavior During Therapy: [facility] for tasks assessed/performed ?Overall Cognitive Status: Within Functional Limits for tasks assessed ?  ?  ?  ?  ?  ?  ?  ?  ?  ?  ?  ?  ?  ?  ?  ?  ?  ?  ?  ? ?  ?  General Comments  Nursing cleared pt for participation in physical therapy.  Pt agreeable to PT session. ? ?  ?Exercises    ? ?Assessment/Plan  ?  ?PT Assessment Patient needs continued PT services  ?PT Problem List Decreased strength;Decreased activity tolerance;Decreased balance;Decreased mobility;Decreased knowledge of use of DME;Cardiopulmonary status limiting activity ? ?   ?  ?PT Treatment Interventions DME instruction;Gait training;Stair training;Functional mobility training;Therapeutic activities;Therapeutic  exercise;Balance training;Patient/family education   ? ?PT Goals (Current goals can be found in the Care Plan section)  ?Acute Rehab PT Goals ?Patient Stated Goal: improve strength and mobility ?PT Goal Formulation: With p

## 2022-01-23 NOTE — Care Management Important Message (Signed)
Important Message ? ?Patient Details  ?Name: Deanna [name] ?MRN: [id number] ?Date of Birth: [birth year] ? ? ?Medicare Important Message Given:  N/A - LOS <3 / Initial given by admissions ? ? ? ? ?[name] A Analina Filla ?01/23/2022, 11:04 AM ?

## 2022-01-23 NOTE — TOC Initial Note (Signed)
Transition of Care (TOC) - Initial/Assessment Note  ? ? ?Patient Details  ?Name: Fredricka Breuer ?MRN: [id number] ?Date of Birth: 1951/08/03 ? ?Transition of Care (TOC) CM/SW Contact:    ?Alberteen Sam, LCSW ?Phone Number: ?01/23/2022, 9:18 AM ? ?Clinical Narrative:                 ? ?Patient from home with family, and has [name] as her HCPOA. ? ?Patient stays with [name], patient is active with [facility] for RN, PT, and OT.   ? ?Patient will need [place] orders for PT OT and RN at time of discharge, will notify [place] with [facility] at time of discharge.  ? ?Expected Discharge Plan: [place] ?Barriers to Discharge: Continued Medical Work up ? ? ?Patient Goals and CMS Choice ?Patient states their goals for this hospitalization and ongoing recovery are:: to go home ?CMS Medicare.gov Compare Post Acute Care list provided to:: Patient ?Choice offered to / list presented to : Patient ? ?Expected Discharge Plan and Services ?Expected Discharge Plan: [place] ?  ?  ?  ?Living arrangements for the past 2 months: [place] ?                ?  ?  ?  ?  ?  ?HH Arranged: PT, OT, RN ?[place] Agency: [place] ?Date HH Agency Contacted: 01/23/22 ?Time [place]: [phone] ?Representative spoke with at [place]: [place] ? ?Prior Living Arrangements/Services ?Living arrangements for the past 2 months: [place] ?Lives with:: Relatives ?  ?Do you feel safe going back to the place where you live?: Yes      ?  ?  ?  ?  ? ?Activities of Daily Living ?  ?  ? ?Permission Sought/Granted ?  ?  ?   ?   ?   ?   ? ?Emotional Assessment ?Appearance:: Appears stated age ?  ?  ?  ?Alcohol / Substance Use: Not Applicable ?Psych Involvement: No (comment) ? ?Admission diagnosis:  Shortness of breath [R06.02] ?SOB (shortness of breath) [R06.02] ?Acute respiratory failure with hypoxia ([place]) [J96.01] ?Sepsis with acute organ dysfunction, due  to unspecified organism, unspecified type, unspecified whether septic shock present ([place]) [A41.9, R65.20] ?Patient Active Problem List  ? Diagnosis Date Noted  ? Shortness of breath 01/21/2022  ? Severe sepsis ([place]) 01/20/2022  ? SOB (shortness of breath) 01/20/2022  ? Chest pain 01/20/2022  ? Hypoglycemia associated with diabetes ([place]) 01/20/2022  ? End-stage renal disease on hemodialysis ([place]) 01/20/2022  ? Aspiration pneumonia ([place]) 01/20/2022  ? Acute respiratory failure with hypoxia and hypercapnia ([place]) 01/19/2022  ? Angioedema 01/14/2022  ? Elevated troponin 01/08/2022  ? Hyperkalemia 01/08/2022  ? Acute right flank pain   ? Weakness of right lower extremity   ? Right sided numbness   ? CVA (cerebral vascular accident) ([place]) 12/28/2021  ? Hypokalemia 12/27/2021  ? Acute kidney injury superimposed on chronic kidney disease ([place]) 12/27/2021  ? Anemia 12/27/2021  ? Acute CVA (cerebrovascular accident) ([place]) 12/27/2021  ? Lobar pneumonia ([place]) 12/27/2021  ? Right sided weakness 12/26/2021  ? HFrEF (heart failure with reduced ejection fraction) ([place]) 10/31/2021  ? Angina of effort ([place]) 03/01/2016  ? Essential (primary) hypertension 06/24/2014  ? Type 2 diabetes mellitus without complications ([place]) 76/28/3151  ? ?PCP:  [facility] [place] ?Pharmacy:   ?[facility] ([facility] [place] - [address] ?[place] ?[place]orina [place]on ([place]) [place] [address] ?Phone: [phone] Fax: [phone] ? ? ? ? ?Social Determinants of Health (SDOH) Interventions ?  ? ?Readmission Risk Interventions ? ?  01/16/2022  ?  3:45 PM 12/29/2021  ?  2:33 PM  ?Readmission Risk Prevention Plan  ?Transportation Screening Complete Complete  ?PCP or Specialist Appt within 5-7 Days  Complete  ?Home Care Screening  Complete  ?Medication Review (RN CM)  Complete  ?Medication Review [employer]) Complete   ?PCP or Specialist appointment within 3-5 days of discharge Complete   ?[place] or Home Care  Consult Complete   ?SW Recovery Care/Counseling Consult Complete   ?Palliative Care Screening Not Applicable   ?[place] Not Applicable   ? ? ? ?

## 2022-01-23 NOTE — Progress Notes (Signed)
? ? ? ?Progress Note  ? ? ?Deanna [name]  FWY:[id number] DOB: 12-24-1950  DOA: 01/20/2022 ?PCP: [facility]  ? ? ? ? ?Brief Narrative:  ? ? ?Medical records reviewed and are as summarized below: ? ?Deanna [name] is a 71 y.o. 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  She presented to the hospital again on 01/20/2022 for shortness of breath and generalized weakness.  She did not go to outpatient dialysis on the day that she presented to the ED.  Her oxygen saturation was 88% on room air. ? ? ? ? ?Assessment/Plan:  ? ?Principal Problem: ?  SOB (shortness of breath) ?Active Problems: ?  HFrEF (heart failure with reduced ejection fraction) ([place]) ?  Severe sepsis ([place]) ?  Aspiration pneumonia ([place]) ?  Hypoglycemia associated with diabetes ([place]) ?  Type 2 diabetes mellitus without complications ([place]) ?  Elevated troponin ?  Chest pain ?  Essential (primary) hypertension ?  Anemia ?  End-stage renal disease on hemodialysis ([place]) ?  Shortness of breath ? ? ?Acute hypoxic respiratory failure: Improved.  She is tolerating room air. ? ?Suspected severe sepsis from pneumonia, probable aspiration pneumonia: Continue clindamycin.  (CT chest showed patulous appearance of the esophagus which is debris-filled and changes in the right lung base concerning for aspiration).  Obtain barium swallow for further evaluation of  ? ?Hypoglycemia in the setting of recent poor oral intake, type II DM: Glucose levels have stabilized.  She is not getting much insulin in the hospital yet her glucose levels have been okay.  She is probably getting too much insulin at home.  Home insulin may  have to be discontinued at discharge with close outpatient follow-up.  Patient states she takes NovoLog 15 units in the morning, 20 units in the afternoon and 25 units in the evening, she also takes Lantus 65 units nightly.  She has not had problems with this regimen for years.  ? ?ESRD, secondary hyperparathyroidism, anemia of chronic disease: Follow-up with nephrologist for hemodialysis. ? ?Elevated troponin: This is likely from demand ischemia.  No chest pain. ? ?Chronic systolic and diastolic CHF probably with acute exacerbation from missed outpatient dialysis.   ? ?Debility: Plan to discharge home with home health therapy as recommended by PT and OT. ? ?Other comorbidities include CAD, hypertension, hyperlipidemia, recent intubation for angioedema from lisinopril/Entresto ? ? ?Diet Order   ? ?       ?  Diet NPO time specified  Diet effective now       ?  ? ?  ?  ? ?  ? ? ? ? ? ? ? ? ?Consultants: ?Nephrologist ? ?Procedures: ?None ? ? ? ?Medications:  ? ? aspirin EC  81 mg Oral Daily  ? atorvastatin  40 mg Oral Daily  ? Chlorhexidine Gluconate Cloth  6 each Topical Q0600  ? clindamycin  300 mg Oral Q8H  ? clopidogrel  75 mg Oral Daily  ? dextrose  1 ampule Intravenous Once  ? ferrous sulfate  325 mg Oral q AM  ? gabapentin  200 mg Oral TID  ? heparin injection (subcutaneous)  5,000 Units Subcutaneous Q8H  ? insulin aspart  0-6 Units Subcutaneous TID WC  ? metoprolol tartrate  25  mg Oral BID  ? sodium chloride flush  3 mL Intravenous Q12H  ? ?Continuous Infusions: ? ? ? ? ?Anti-infectives (From admission, onward)  ? ? Start     Dose/Rate Route Frequency Ordered Stop  ? 01/23/22 1200  vancomycin (VANCOCIN) IVPB 1000 mg/200 mL premix  Status:  Discontinued       ? 1,000 mg ?200 mL/hr over 60 Minutes Intravenous Every M-W-F (Hemodialysis) 01/20/22 2322 01/22/22 1201  ? 01/22/22 1400  clindamycin (CLEOCIN) capsule 300 mg       ? 300 mg Oral Every 8 hours 01/22/22 1202 01/27/22 1359  ? 01/21/22 1600  vancomycin  (VANCOCIN) IVPB 1000 mg/200 mL premix       ? 1,000 mg ?200 mL/hr over 60 Minutes Intravenous  Once 01/21/22 1354 01/21/22 1858  ? 01/20/22 2315  ceFEPIme (MAXIPIME) 1 g in sodium chloride 0.9 % 100 mL IVPB  Status:  Discontinued       ? 1 g ?200 mL/hr over 30 Minutes Intravenous Every 24 hours 01/20/22 2305 01/22/22 1201  ? 01/20/22 2300  vancomycin (VANCOCIN) IVPB 1000 mg/200 mL premix  Status:  Discontinued       ? 1,000 mg ?200 mL/hr over 60 Minutes Intravenous  Once 01/20/22 2254 01/20/22 2257  ? 01/20/22 2300  vancomycin (VANCOREADY) IVPB 750 mg/150 mL       ? 750 mg ?150 mL/hr over 60 Minutes Intravenous  Once 01/20/22 2258 01/21/22 0430  ? 01/20/22 1945  vancomycin (VANCOCIN) IVPB 1000 mg/200 mL premix       ? 1,000 mg ?200 mL/hr over 60 Minutes Intravenous  Once 01/20/22 1937 01/20/22 2122  ? 01/20/22 1945  ceFEPIme (MAXIPIME) 2 g in sodium chloride 0.9 % 100 mL IVPB       ? 2 g ?200 mL/hr over 30 Minutes Intravenous  Once 01/20/22 1937 01/20/22 2018  ? ?  ? ? ? ? ? ? ? ? ? ?Family Communication/Anticipated D/C date and plan/Code Status  ? ?DVT prophylaxis: heparin injection 5,000 Units Start: 01/23/22 1400 ? ? ?  Code Status: Full Code ? ?Family Communication: None ?Disposition Plan: Plan to discharge home tomorrow ? ? ? ? ? ? ? ? ?Subjective:  ? ?No shortness of breath or chest pain.  She said her swallowing is okay. ? ? ?Objective:  ? ? ?Vitals:  ? 01/23/22 0601 01/23/22 0800 01/23/22 0901 01/23/22 1129  ?BP:  (!) 163/70  (!) 145/69  ?Pulse:  77  83  ?Resp:  '17 15 17  '$ ?Temp:  98 ?F (36.7 ?C)  98.4 ?F (36.9 ?C)  ?TempSrc:  Oral    ?SpO2:  100%  97%  ?Weight: 82.1 kg     ?Height: '6\' 1"'$  (1.854 m)     ? ?No data found. ? ? ?Intake/Output Summary (Last 24 hours) at 01/23/2022 1315 ?Last data filed at 01/22/2022 1909 ?Gross per 24 hour  ?Intake --  ?Output 0 ml  ?Net 0 ml  ? ?Filed Weights  ? 01/22/22 0500 01/23/22 0601  ?Weight: 85.4 kg 82.1 kg  ? ? ?Exam: ? ?GEN: NAD ?SKIN: No rash ?EYES: EOMI ?ENT: MMM ?CV:  RRR ?PULM: CTA B ?ABD: soft, ND, NT, +BS ?CNS: AAO x 3, non focal ?EXT: No edema or tenderness ? ? ? ?  ? ? ?Data Reviewed:  ? ?I have personally reviewed following labs and imaging studies: ? ?Labs: ?Labs show the following:  ? ?Basic Metabolic Panel: ?Recent Labs  ?Lab 01/17/22 ?5809 01/17/22 ?1637 01/18/22 ?  0500 01/19/22 ?0530 01/20/22 ?1922 01/21/22 ?0234 01/21/22 ?6834 01/23/22 ?1962  ?NA 131*  --  135 135 139 138 137 134*  ?K 4.2  --  4.9 3.7 4.7 3.6 3.7 4.0  ?CL 96*  --  96* 99 101 102 100 96*  ?CO2 26  --  25 27 21* '26 27 27  '$ ?GLUCOSE 166*  --  192* 224* 120* 49* 115* 182*  ?BUN 36*  --  48* 31* 50* 52* 52* 41*  ?CREATININE 2.70*  --  3.31* 2.32* 3.46* 3.56* 3.42* 3.16*  ?CALCIUM 7.9*  --  7.7* 7.7* 8.8* 8.4* 8.2* 7.9*  ?MG 1.6* 2.0 2.0 1.7  --   --  1.8  --   ?PHOS 5.2*  --  6.2* 3.7  --  5.6*  --  5.3*  ? ?GFR ?Estimated Creatinine Clearance: 19.7 mL/min (A) (by C-G formula based on SCr of 3.16 mg/dL (H)). ?Liver Function Tests: ?Recent Labs  ?Lab 01/17/22 ?2297 01/20/22 ?1922 01/21/22 ?0234 01/21/22 ?9892 01/23/22 ?1194  ?AST  --  69*  --  72*  --   ?ALT  --  81*  --  76*  --   ?ALKPHOS  --  394*  --  314*  --   ?BILITOT  --  0.9  --  0.6  --   ?PROT  --  7.3  --  7.1  --   ?ALBUMIN 2.4* 2.9* 2.7* 2.6* 2.3*  ? ?No results for input(s): LIPASE, AMYLASE in the last 168 hours. ?No results for input(s): AMMONIA in the last 168 hours. ?Coagulation profile ?Recent Labs  ?Lab 01/20/22 ?1922  ?INR 1.1  ? ? ?CBC: ?Recent Labs  ?Lab 01/18/22 ?0500 01/19/22 ?0530 01/20/22 ?1922 01/21/22 ?0234 01/21/22 ?0454 01/21/22 ?1445 01/23/22 ?1045  ?WBC 11.7* 9.4 23.2* 13.5* 12.5* 9.1 6.6  ?NEUTROABS 10.3* 7.9* 19.2*  --   --   --   --   ?HGB 9.0* 8.8* 11.1* 9.9* 9.1* 8.3* 8.6*  ?HCT 30.1* 28.8* 37.8 33.7* 30.8* 27.1* 28.5*  ?MCV 88.5 88.3 92.0 91.1 90.3 86.0 89.3  ?PLT 234 225 266 243 228 228 302  ? ?Cardiac Enzymes: ?No results for input(s): CKTOTAL, CKMB, CKMBINDEX, TROPONINI in the last 168 hours. ?BNP (last 3 results) ?No  results for input(s): PROBNP in the last 8760 hours. ?CBG: ?Recent Labs  ?Lab 01/22/22 ?1703 01/22/22 ?2242 01/23/22 ?1740 01/23/22 ?0801 01/23/22 ?1131  ?GLUCAP 152* 171* 112* 177* 88  ? ?D-Dimer: ?No resu

## 2022-01-23 NOTE — Progress Notes (Signed)
?[facility]  ?PROGRESS NOTE  ? ?Subjective:  ? ?Patient seen sitting up in bed, alert and oriented ?Denies current shortness of breath, remains on room air ?Denies nausea and vomiting, appetite remains poor ? ?Dialysis scheduled for tomorrow ? ?Objective:  ?Vital signs: ?Blood pressure (!) 145/69, pulse 83, temperature 98.4 ?F (36.9 ?C), resp. rate 17, height '6\' 1"'$  (1.854 m), weight 82.1 kg, SpO2 97 %. ? ?Intake/Output Summary (Last 24 hours) at 01/23/2022 1318 ?Last data filed at 01/22/2022 1909 ?Gross per 24 hour  ?Intake --  ?Output 0 ml  ?Net 0 ml  ? ? ?Filed Weights  ? 01/22/22 0500 01/23/22 0601  ?Weight: 85.4 kg 82.1 kg  ? ? ? ?Physical Exam: ?General:  No acute distress  ?Head:  Normocephalic, atraumatic. Moist oral mucosal membranes  ?Eyes:  Anicteric  ?Lungs:  Right-sided wheeze, normal effort  ?Heart:  S1S2 no rubs  ?Abdomen:   Soft, nontender, bowel sounds present  ?Extremities: 1+ peripheral edema.  ?Neurologic:  Awake, alert, following commands  ?Skin:  No lesions  ?Access: Right PermCath  ? ? ?Basic Metabolic Panel: ?Recent Labs  ?Lab 01/17/22 ?9604 01/17/22 ?1637 01/18/22 ?0500 01/19/22 ?0530 01/20/22 ?1922 01/21/22 ?0234 01/21/22 ?5409 01/23/22 ?8119  ?NA 131*  --  135 135 139 138 137 134*  ?K 4.2  --  4.9 3.7 4.7 3.6 3.7 4.0  ?CL 96*  --  96* 99 101 102 100 96*  ?CO2 26  --  25 27 21* '26 27 27  '$ ?GLUCOSE 166*  --  192* 224* 120* 49* 115* 182*  ?BUN 36*  --  48* 31* 50* 52* 52* 41*  ?CREATININE 2.70*  --  3.31* 2.32* 3.46* 3.56* 3.42* 3.16*  ?CALCIUM 7.9*  --  7.7* 7.7* 8.8* 8.4* 8.2* 7.9*  ?MG 1.6* 2.0 2.0 1.7  --   --  1.8  --   ?PHOS 5.2*  --  6.2* 3.7  --  5.6*  --  5.3*  ? ? ? ?CBC: ?Recent Labs  ?Lab 01/18/22 ?0500 01/19/22 ?0530 01/20/22 ?1922 01/21/22 ?0234 01/21/22 ?0454 01/21/22 ?1445 01/23/22 ?1045  ?WBC 11.7* 9.4 23.2* 13.5* 12.5* 9.1 6.6  ?NEUTROABS 10.3* 7.9* 19.2*  --   --   --   --   ?HGB 9.0* 8.8* 11.1* 9.9* 9.1* 8.3* 8.6*  ?HCT 30.1* 28.8* 37.8 33.7* 30.8* 27.1* 28.5*   ?MCV 88.5 88.3 92.0 91.1 90.3 86.0 89.3  ?PLT 234 225 266 243 228 228 302  ? ? ? ? ?Urinalysis: ?No results for input(s): COLORURINE, LABSPEC, [place], GLUCOSEU, HGBUR, BILIRUBINUR, KETONESUR, PROTEINUR, UROBILINOGEN, NITRITE, LEUKOCYTESUR in the last 72 hours. ? ?Invalid input(s): APPERANCEUR  ? ? ?Imaging: ?CT Angio Chest Pulmonary Embolism (PE) W or WO Contrast ? ?Result Date: 01/22/2022 ?CLINICAL DATA:  Pulmonary embolism, suspected pulmonary embolism. EXAM: CT ANGIOGRAPHY CHEST WITH CONTRAST TECHNIQUE: Multidetector CT imaging of the chest was performed using the standard protocol during bolus administration of intravenous contrast. Multiplanar CT image reconstructions and MIPs were obtained to evaluate the vascular anatomy. RADIATION DOSE REDUCTION: This exam was performed according to the departmental dose-optimization program which includes automated exposure control, adjustment of the mA and/or kV according to patient size and/or use of iterative reconstruction technique. CONTRAST:  44m OMNIPAQUE IOHEXOL 350 MG/ML SOLN COMPARISON:  Comparison made with January 07, 2022. FINDINGS: Cardiovascular: RIGHT IJ dialysis catheter terminates in the area of the caval to atrial junction. Heart size remains enlarged without pericardial effusion. Signs of three-vessel coronary artery disease. Main pulmonary  artery is well opacified with maximal opacification is 616 Hounsfield units. This study is negative for pulmonary embolism. Mediastinum/Nodes: Mediastinal nodal enlargement is unchanged. (Image 63/5) 17 18 mm subcarinal lymph node. (Image 58/5) 14 mm RIGHT paratracheal lymph node. AP window nodal tissue similar to previous imaging as well. Mild fullness of RIGHT hilar nodal tissue is stable. Debris in the esophagus which is patulous showing a similar appearance to the prior study. Lungs/Pleura: Dense consolidative changes with parenchymal calcification at the RIGHT lung base. Slight increasing consolidative changes in  the medial portion of the RIGHT lower lobe when compared to previous imaging. Mild septal thickening persists. Patchy ground-glass seen on the April 15 study has largely resolved. Basilar atelectasis on the LEFT. Airways are patent. RIGHT pleural effusion is similar to prior imaging. Upper Abdomen: Reflux of contrast into hepatic veins from the RIGHT atrium. Incidental imaging of upper abdominal contents without acute process. Musculoskeletal: No acute bone finding. No destructive bone process. Spinal degenerative changes. Review of the MIP images confirms the above findings. IMPRESSION: 1. Negative for pulmonary embolism. 2. Dense consolidation in the RIGHT lower lobe associated with parenchymal calcification slightly worse in the medial aspect of the RIGHT lung base. Findings could reflect sequela of aspiration with interval worsening given that there is a patulous appearance of the esophagus which is debris filled. 3. Largely stable appearance of partially loculated RIGHT-sided pleural effusion 4. Cardiomegaly with signs of cardiac dysfunction given reflux of contrast into hepatic veins. 5. Nodal enlargement in the chest is nonspecific and could be reactive and at least partially related to cardiac dysfunction. Given size of these lymph nodes would however suggest 6-8 week follow-up to ensure improvement and or resolution. Aortic Atherosclerosis (ICD10-I70.0). Electronically Signed   By: Zetta Bills M.D.   On: 01/22/2022 10:56   ? ? ?Medications:  ? ? ? aspirin EC  81 mg Oral Daily  ? atorvastatin  40 mg Oral Daily  ? Chlorhexidine Gluconate Cloth  6 each Topical Q0600  ? clindamycin  300 mg Oral Q8H  ? clopidogrel  75 mg Oral Daily  ? dextrose  1 ampule Intravenous Once  ? ferrous sulfate  325 mg Oral q AM  ? gabapentin  200 mg Oral TID  ? heparin injection (subcutaneous)  5,000 Units Subcutaneous Q8H  ? insulin aspart  0-6 Units Subcutaneous TID WC  ? metoprolol tartrate  25 mg Oral BID  ? sodium chloride  flush  3 mL Intravenous Q12H  ? ? ?Assessment/ Plan:  ?   ?Principal Problem: ?  SOB (shortness of breath) ?Active Problems: ?  Essential (primary) hypertension ?  HFrEF (heart failure with reduced ejection fraction) ([place]) ?  Type 2 diabetes mellitus without complications ([place]) ?  Anemia ?  Elevated troponin ?  Severe sepsis ([place]) ?  Chest pain ?  Hypoglycemia associated with diabetes ([place]) ?  End-stage renal disease on hemodialysis ([place]) ?  Aspiration pneumonia ([place]) ?  Shortness of breath ? ?71 year old female with history of diabetes, hypertension, coronary artery disease, congestive heart failure, status post CVA, ESRD on hemodialysis now admitted with history of shortness of breath.  She was found to have hypoglycemia and angioedema and is presently improving. ? ?CC K[facility]/TTS/right PermCath/82 kg ?  ?#1: ESKD: Will maintain outpatient schedule.  Next treatment scheduled for Tuesday. ?  ?2: Anemia: Patient is anemia secondary to chronic kidney disease.:  Currently 8.6.  We will continue EPO with treatments. ?  ?#3: Secondary hyperparathyroidism: Patient is  elevated PTH levels and lower calcium levels.  This is secondary to ESKD. ? Calcium remains decreased at 7.9.  Phosphorus elevated but within acceptable range.  These will correct with dialysis and nutrition.  We will continue to monitor. ?  ?#4: Pneumonia/sepsis: Patient is presently on clindamycin. ?  ?#5: Hypoglycemia: Resolved at this time. ?  ?#6: Hypertension: Blood pressure acceptable for this patient at 145/69.  Continue the metoprolol at the present doses ? ?#7: CHF: Congestive heart failure has improved with dialysis. ? ? LOS: 3 ?[place] ?[facility] ?5/1/20231:18 PM ?  ?

## 2022-01-24 DIAGNOSIS — J690 Pneumonitis due to inhalation of food and vomit: Secondary | ICD-10-CM | POA: Diagnosis not present

## 2022-01-24 DIAGNOSIS — R0602 Shortness of breath: Secondary | ICD-10-CM | POA: Diagnosis not present

## 2022-01-24 DIAGNOSIS — N186 End stage renal disease: Secondary | ICD-10-CM | POA: Diagnosis not present

## 2022-01-24 DIAGNOSIS — E11649 Type 2 diabetes mellitus with hypoglycemia without coma: Secondary | ICD-10-CM | POA: Diagnosis not present

## 2022-01-24 LAB — GLUCOSE, CAPILLARY
Glucose-Capillary: 188 mg/dL — ABNORMAL HIGH (ref 70–99)
Glucose-Capillary: 129 mg/dL — ABNORMAL HIGH (ref 70–99)
Glucose-Capillary: 149 mg/dL — ABNORMAL HIGH (ref 70–99)

## 2022-01-24 MED ORDER — EPOETIN ALFA 4000 UNIT/ML IJ SOLN
INTRAMUSCULAR | Status: AC
  Filled 2022-01-24: qty 1

## 2022-01-24 MED ORDER — PANTOPRAZOLE SODIUM 40 MG PO TBEC
40.0000 mg | DELAYED_RELEASE_TABLET | Freq: Every day | ORAL | Status: DC
  Administered 2022-01-25 – 2022-01-27 (×2): 40 mg via ORAL
  Filled 2022-01-24 (×3): qty 1

## 2022-01-24 MED ORDER — EPOETIN ALFA 4000 UNIT/ML IJ SOLN
4000.0000 [IU] | INTRAMUSCULAR | Status: DC
  Administered 2022-01-24 – 2022-01-26 (×2): 4000 [IU] via INTRAVENOUS
  Filled 2022-01-24: qty 1

## 2022-01-24 MED ORDER — ACETAMINOPHEN 325 MG PO TABS
650.0000 mg | ORAL_TABLET | Freq: Once | ORAL | Status: AC
  Administered 2022-01-24: 650 mg via ORAL

## 2022-01-24 MED ORDER — HEPARIN SODIUM (PORCINE) 1000 UNIT/ML IJ SOLN
INTRAMUSCULAR | Status: AC
Start: 2022-01-24 — End: 2022-01-24
  Filled 2022-01-24: qty 10

## 2022-01-24 MED ORDER — ACETAMINOPHEN 325 MG PO TABS
ORAL_TABLET | ORAL | Status: AC
Start: 2022-01-24 — End: 2022-01-24
  Filled 2022-01-24: qty 2

## 2022-01-24 NOTE — Progress Notes (Signed)
PT Cancellation Note ? ?Patient Details ?Name: Deanna [name] ?MRN: [id number] ?DOB: 30-Dec-1950 ? ? ?Cancelled Treatment:    Reason Eval/Treat Not Completed: Other (comment).  Pt currently off unit at dialysis.  Will re-attempt PT session at a later date/time. ? ?Leitha Bleak, PT ?01/24/22, 1:14 PM ? ?

## 2022-01-24 NOTE — Progress Notes (Addendum)
Inpatient Diabetes Program Recommendations ? ?AACE/ADA: New Consensus Statement on Inpatient Glycemic Control (2015) ? ?Target Ranges:  Prepandial:   less than 140 mg/dL ?     Peak postprandial:   less than 180 mg/dL (1-2 hours) ?     Critically ill patients:  140 - 180 mg/dL  ? ?Lab Results  ?Component Value Date  ? GLUCAP 188 (H) 01/24/2022  ? HGBA1C 5.5 12/27/2021  ? ? Latest Reference Range & Units 01/23/22 08:01 01/23/22 11:31 01/23/22 16:02 01/23/22 16:57 01/23/22 20:31 01/23/22 22:32 01/24/22 07:20  ?Glucose-Capillary 70 - 99 mg/dL 177 (H) 88 129 (H) 131 (H) 279 (H) 217 (H) 188 (H)  ? ? ? ?Called granddaughter to reinforce education due to RN staff noting pt was saying to take more insulin when she had hypoglycemia at home. Left voicemail. ? ?Granddaughter called back. Insulin is being given as prescribed and appropriately. Granddaughter said before the pts stroke 1 month ago everything had been under control. Since then pt has been in and out of the hospital 2 of those times pt came back into the hospital 24 hours after being discharged. Due to this pt's granddaughter has some concerns.  Granddaughter mentions to me there are some days the pt refuses to take insulin and makes her glucose run up on purpose. Granddaughter tries to reason with her and pt tends to listen more to her.  ? ?Thanks, ? ?Tama Headings RN, MSN, BC-ADM ?Inpatient Diabetes Coordinator ?Team Pager [phone] (8a-5p) ?

## 2022-01-24 NOTE — Progress Notes (Signed)
?[facility]  ?PROGRESS NOTE  ? ?Subjective:  ? ?Patient sitting up in bed eating breakfast ?States she feel tired today ?Unable to rest well overnight ? ?Patient seen and evaluated later during dialysis ?  ?HEMODIALYSIS FLOWSHEET: ? ?Blood Flow Rate (mL/min): 400 mL/min ?Arterial Pressure (mmHg): -160 mmHg ?Venous Pressure (mmHg): 140 mmHg ?Transmembrane Pressure (mmHg): 50 mmHg ?Ultrafiltration Rate (mL/min): 430 mL/min ?Dialysate Flow Rate (mL/min): 500 ml/min ?Conductivity: Machine : 13.8 ?Conductivity: Machine : 13.8 ?Dialysis Fluid Bolus: Normal Saline ?Bolus Amount (mL): 250 mL ? ?Patient had requested to sit in chair for dialysis, but states she was too weak for it today.  ? ?Objective:  ?Vital signs: ?Blood pressure (!) 159/71, pulse 85, temperature 98.8 ?F (37.1 ?C), temperature source Oral, resp. rate 17, height '6\' 1"'$  (1.854 m), weight 82.9 kg, SpO2 100 %. ? ?Intake/Output Summary (Last 24 hours) at 01/24/2022 1312 ?Last data filed at 01/23/2022 2300 ?Gross per 24 hour  ?Intake 237 ml  ?Output --  ?Net 237 ml  ? ? ?Filed Weights  ? 01/23/22 0601 01/24/22 0500 01/24/22 1028  ?Weight: 82.1 kg 82.1 kg 82.9 kg  ? ? ? ?Physical Exam: ?General:  No acute distress  ?Head:  Normocephalic, atraumatic. Moist oral mucosal membranes  ?Eyes:  Anicteric  ?Lungs:  Right-sided wheeze, normal effort  ?Heart:  S1S2 no rubs  ?Abdomen:   Soft, nontender, bowel sounds present  ?Extremities: trace peripheral edema.  ?Neurologic:  Awake, alert, following commands  ?Skin:  No lesions  ?Access: Right PermCath  ? ? ?Basic Metabolic Panel: ?Recent Labs  ?Lab 01/17/22 ?1637 01/18/22 ?0500 01/18/22 ?0500 01/19/22 ?0530 01/20/22 ?1922 01/21/22 ?0234 01/21/22 ?7824 01/23/22 ?2353  ?NA  --  135   < > 135 139 138 137 134*  ?K  --  4.9   < > 3.7 4.7 3.6 3.7 4.0  ?CL  --  96*   < > 99 101 102 100 96*  ?CO2  --  25   < > 27 21* '26 27 27  '$ ?GLUCOSE  --  192*   < > 224* 120* 49* 115* 182*  ?BUN  --  48*   < > 31* 50* 52* 52* 41*   ?CREATININE  --  3.31*   < > 2.32* 3.46* 3.56* 3.42* 3.16*  ?CALCIUM  --  7.7*   < > 7.7* 8.8* 8.4* 8.2* 7.9*  ?MG 2.0 2.0  --  1.7  --   --  1.8  --   ?PHOS  --  6.2*  --  3.7  --  5.6*  --  5.3*  ? < > = values in this interval not displayed.  ? ? ? ?CBC: ?Recent Labs  ?Lab 01/18/22 ?0500 01/19/22 ?0530 01/20/22 ?1922 01/21/22 ?0234 01/21/22 ?0454 01/21/22 ?1445 01/23/22 ?1045  ?WBC 11.7* 9.4 23.2* 13.5* 12.5* 9.1 6.6  ?NEUTROABS 10.3* 7.9* 19.2*  --   --   --   --   ?HGB 9.0* 8.8* 11.1* 9.9* 9.1* 8.3* 8.6*  ?HCT 30.1* 28.8* 37.8 33.7* 30.8* 27.1* 28.5*  ?MCV 88.5 88.3 92.0 91.1 90.3 86.0 89.3  ?PLT 234 225 266 243 228 228 302  ? ? ? ? ?Urinalysis: ?No results for input(s): COLORURINE, LABSPEC, [place], GLUCOSEU, HGBUR, BILIRUBINUR, KETONESUR, PROTEINUR, UROBILINOGEN, NITRITE, LEUKOCYTESUR in the last 72 hours. ? ?Invalid input(s): APPERANCEUR  ? ? ?Imaging: ?DG ESOPHAGUS W SINGLE CM (SOL OR THIN BA) ? ?Result Date: 01/23/2022 ?CLINICAL DATA:  Some difficulty swallowing.  Pneumonia. EXAM: ESOPHOGRAM/BARIUM SWALLOW TECHNIQUE:  Single contrast examination was performed using  thin barium. FLUOROSCOPY: Radiation Exposure Index (as provided by the fluoroscopic device): 42 seconds. 22.90 MGy [place] COMPARISON:  None. FINDINGS: Patient was difficult to position for the examination. A sufficient examination was achieved. [place] pharyngeal phase is normal. The patient had no difficulty with initiation. There is no penetration or aspiration. No cardiac a pharyngeal stenosis. No diverticular disease. No obstructing lesion of the cervical or thoracic esophagus. 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 No hiatal hernia. IMPRESSION: Negative study for age. No penetration or aspiration. No fixed stenosis. No hiatal hernia. 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 Electronically Signed   By: Nelson Chimes M.D.   On: 01/23/2022 15:54   ? ? ?Medications:  ? ? ? aspirin EC  81 mg Oral Daily  ? atorvastatin  40 mg Oral Daily  ? Chlorhexidine  Gluconate Cloth  6 each Topical Q0600  ? clindamycin  300 mg Oral Q8H  ? clopidogrel  75 mg Oral Daily  ? dextrose  1 ampule Intravenous Once  ? ferrous sulfate  325 mg Oral q AM  ? gabapentin  200 mg Oral TID  ? heparin injection (subcutaneous)  5,000 Units Subcutaneous Q8H  ? insulin aspart  0-6 Units Subcutaneous TID WC  ? metoprolol tartrate  25 mg Oral BID  ? sodium chloride flush  3 mL Intravenous Q12H  ? ? ?Assessment/ Plan:  ?   ?Principal Problem: ?  SOB (shortness of breath) ?Active Problems: ?  Essential (primary) hypertension ?  HFrEF (heart failure with reduced ejection fraction) ([place]) ?  Type 2 diabetes mellitus without complications ([place]) ?  Anemia ?  Elevated troponin ?  Severe sepsis ([place]) ?  Chest pain ?  Hypoglycemia associated with diabetes ([place]) ?  End-stage renal disease on hemodialysis ([place]) ?  Aspiration pneumonia ([place]) ?  Shortness of breath ? ?71 year old female with history of diabetes, hypertension, coronary artery disease, congestive heart failure, status post CVA, ESRD on hemodialysis now admitted with history of shortness of breath.  She was found to have hypoglycemia and angioedema and is presently improving. ? ?CC K[facility]/TTS/right PermCath/82 kg ?  ?#1: End stage renal disease on hemodialysis Will maintain outpatient schedule.  Receiving dialysis today with UF goal 1L as tolerated. Next treatment scheduled for Thursday. ?  ?2: Anemia: Patient is anemia secondary to chronic kidney disease.:  Hgb 8.6.  We will continue low dose EPO with treatments. ?  ?#3: Secondary hyperparathyroidism: Patient is elevated PTH levels and lower calcium levels.  This is secondary to ESKD. ? Will continue to monitor during this admission.  These will correct with dialysis and nutrition.  ?  ?#4: Pneumonia/sepsis: Remains on clindamycin. ?  ?#5: Hypoglycemia: Resolved  ?  ?#6: Hypertension: Blood pressure acceptable for this patient at 145/69.  Continue the metoprolol at the present  doses ? ?#7: Diabetes mellitus type II with chronic kidney disease ?insulin dependent. Home regimen includes Lantus and Novolog. Most recent hemoglobin A1c is 5.5 on 12/27/21.  ? Glucose slightly elevated. Patient and grand-daughter would benefit from diabetes management education.  ? ? LOS: 4 ?[place] ?[facility] ?5/2/20231:12 PM ?  ?

## 2022-01-24 NOTE — Plan of Care (Signed)

## 2022-01-24 NOTE — Care Management Important Message (Signed)
Important Message ? ?Patient Details  ?Name: Deanna [name] ?MRN: [id number] ?Date of Birth: 12/07/1950 ? ? ?Medicare Important Message Given:  Yes ? ? ? ? ?[name] A [name] ?01/24/2022, 12:38 PM ?

## 2022-01-24 NOTE — Progress Notes (Signed)
OT Cancellation Note ? ?Patient Details ?Name: Deanna [name] ?MRN: [id number] ?DOB: [birth year] ? ? ?Cancelled Treatment:    Reason Eval/Treat Not Completed: Patient at procedure or test/ unavailable. Pt out of room for hemodialysis at present. Will attempt OT services at a later date, as pt is available and medically appropriate.  ? ?Josiah Lobo, PhD, MS, OTR/L ?01/24/22, 2:32 PM ?

## 2022-01-24 NOTE — Evaluation (Addendum)
Clinical/Bedside Swallow Evaluation ?Patient Details  ?Name: Deanna [name] ?MRN: [id number] ?Date of Birth: [birth year] ? ?Today's Date: 01/24/2022 ?Time: SLP Start Time (ACUTE ONLY): 0840 SLP Stop Time (ACUTE ONLY): 0940 ?SLP Time Calculation (min) (ACUTE ONLY): 60 min ? ?Past Medical History:  ?Past Medical History:  ?Diagnosis Date  ? Coronary artery disease   ? Diabetes mellitus without complication ([place])   ? Hypertension   ? Stroke [facility])   ? ?Past Surgical History:  ?Past Surgical History:  ?Procedure Laterality Date  ? CARDIAC CATHETERIZATION Left 03/01/2016  ? Procedure: Left Heart Cath and Coronary Angiography;  Surgeon: Teodoro Spray, MD;  Location: [place] CV LAB;  Service: Cardiovascular;  Laterality: Left;  ? DIALYSIS/PERMA CATHETER INSERTION Right 01/10/2022  ? Procedure: DIALYSIS/PERMA CATHETER INSERTION;  Surgeon: Evaristo Bury, MD;  Location: [place] [facility] LAB;  Service: Cardiovascular;  Laterality: Right;  ? INTUBATION-ENDOTRACHEAL WITH TRACHEOSTOMY STANDBY Left 01/14/2022  ? Procedure: INTUBATION-NASAL WITH TRACHEOSTOMY STANDBY;  Surgeon: Beverly Gust, MD;  Location: [facility] ORS;  Service: ENT;  Laterality: Left;  ? TEMPORARY DIALYSIS CATHETER N/A 01/04/2022  ? Procedure: TEMPORARY DIALYSIS CATHETER;  Surgeon: Algernon Huxley, MD;  Location: [place] CV LAB;  Service: Cardiovascular;  Laterality: N/A;  ? ?HPI:  ?Pt  is a 71 y.o. female with medical history significant of heart disease, diabetes mellitus type 2, hypertension, history of stroke, end-stage renal disease on hemodialysis heart failure with reduced ejection fraction, angioedema coming to [place] with shortness of breath that started earlier today with generalized weakness and fever.  Patient on initial evaluation in the emergency room meet sepsis criteria with unclear source of infection.  Patient was given IV fluids and IV antibiotics.  Admission was requested to medicine for sepsis and shortness of breath.  Chart review showed that  patient has been hospitalized throughout this month multiple times all throughout the hospital with an extended stay.  Patient initially was admitted earlier in the beginning of the month for numbness affecting the right arm where she was admitted for stroke evaluation.  Patient was critically ill with renal failure and hyperkalemia plan for immediate dialysis with placement of a temporary dialysis catheter by vascular MD.  On 415 patient had a rapid response where she was transferred to the ICU for respiratory distress.  Patient also had lobar pneumonia parainfluenza virus and treated with 5 days of antibiotic therapy per discharge summary on 01/13/2022.  On 4/20-second/2023 patient seen in the emergency room with angioedema with mild swelling and  Seen by ED doctor Dr. Archie Balboa and ICU service admitted patient as patient needed emergent nasotracheal intubation due to ACE inhibitor induced angioedema.  She was then discharged yesterday after her angioedema admission as her angioedema resolved and patient stabilized.  Patient returns again today with shortness of breath fevers and sepsis.  She presented to the hospital again on 01/20/2022 for shortness of breath and generalized weakness.  She did not go to outpatient dialysis on the day that she presented to the ED.  Her oxygen saturation was 88% on room air.   ?CT of Chest: included cardiomgegay w/ cardiac dysfunction, no PE, and Dense consolidation in the RIGHT lower lobe associated with parenchymal calcification slightly worse in the medial aspect of the  RIGHT lung base. Findings could reflect sequela of aspiration with  interval worsening given that there is a Patulous appearance of the  Esophagus which is debris-filled.  3. Largely stable appearance of partially loculated RIGHT-sided  pleural effusion  ?  ?  Assessment / Plan / Recommendation  ?Clinical Impression ?  Pt was seen during recent admit for BSE. 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  She presented to the hospital again on 01/20/2022 for shortness of breath and generalized weakness.  She did not go to outpatient dialysis on the day that she presented to the ED.  Her oxygen saturation was 88% on room air.  During this admit, a CT of Chest was completed w/ findings including: "dense consolidation in the RIGHT lower lobe associated with parenchymal calcification slightly worse in the medial aspect of the  RIGHT lung base. Findings could reflect sequela of aspiration with  interval worsening given that there is a Patulous appearance of the Esophagus which is debris-filled.". ? ?Pt stated she has "Acid Reflux" but is not on a PPI; none during this admit currently. ANY Dysmotility or Regurgitation of Reflux material can increase risk for aspiration of the Reflux material during Retrograde flow thus impact Voicing and Pulmonary status. ? ?WBC wnl; afebrile; on RA. Edentulous. ? ?During this evaluation at bedside, pt appeared to present w/ adequate oropharyngeal phase swallow function in setting of Edentulous status w/ no oropharyngeal phase dysphagia noted, no neuromuscular deficits noted. Pt consumed po trials w/ No overt, clinical s/s of aspiration during po trials.  ?Pt appears at reduced risk for aspiration from an oropharyngeal phase standpoint following general aspiration precautions in setting of medical illness, Edentulous status. HOWEVER, pt has c/o "Acid Reflux" and is not on a PPI currently. There is a Patulous appearance of the Esophagus which is debris-filled, per the CT of Chest. Pt could be at risk for aspiration from REFLUX material as stated above. ? ?During po trials, pt consumed all consistencies w/ no  overt coughing, decline in vocal quality, or sustained change in respiratory presentation during/post trials. W/ Rest Breaks, she was able to calm her breathing b/t trials from the exertion of the po tasks. (Pt spoke of her need for a "stent" d/t her "bad heart".)  ?Oral phase appeared [facility] w/ timely bolus management and control of bolus propulsion for A-P transfer for swallowing. Min+ mastication time needed as pt mashed/gummed the increased texture trials of graham crackers moistened w/ applesauce. Oral clearing achieved w/ all trial consistencies. OM Exam appeared [facility] w/ no gross unilateral weakness noted. Speech Clear (in setting of Edentulous status). Pt fed self w/ setup support.  ? ?Recommend continue her currently ordered Regular consistency diet w/ well-Cut meats, moistened foods; Thin liquids. Recommend general aspiration precautions including Rest Breaks during meals for conservation of energy; REFLUX precautions. Pills WHOLE in Puree for safer, easier swallowing and Esophageal clearing. Education given on Pills in Puree; food consistencies and easy to eat options; general aspiration and REFLUX precautions.  ?Recommend f/u w/ GI for further assessment of Esophagus in setting of the CT Chest results and her c/o "Acid Reflux".  ?NSG to reconsult if any new needs arise. NSG agreed. ?SLP Visit Diagnosis: Dysphagia, unspecified (R13.10) ?   ?Aspiration Risk ?  (reduced following general precautions; REFLUX precautions)  ?  ?Diet Recommendation   Regular consistency diet w/ well-Cut meats, moistened foods; Thin liquids. Recommend general aspiration precautions including Rest Breaks during meals for conservation of energy; REFLUX precautions.  ? ?Medication Administration: Whole meds with  puree (for easier clearing as needed)  ?  ?Other  Recommendations Recommended Consults: Consider GI evaluation;Consider esophageal assessment (for management of REFLUX) ?Oral Care Recommendations: Oral care BID;Oral care before  and after PO;Patient independent with oral care ?Other Recommendations:  (n/a)   ? ?Recommendations for follow up therapy are one component of a multi-disciplinary discharge planning process, led by the

## 2022-01-24 NOTE — Progress Notes (Signed)
Nutrition Brief Note ? ?RD pulled to chart due to SLP request. Per SLP, pt is requesting snacks between meals.  ? ?Wt Readings from Last 15 Encounters:  ?01/24/22 82.2 kg  ?01/19/22 82.3 kg  ?01/13/22 81.1 kg  ?12/09/17 83.9 kg  ?03/01/16 78.5 kg  ? ?Pt with medical history significant for recent discharge from the hospital on 01/19/2022 after hospitalization for angioedema (from lisinopril/Entresto) requiring intubation and mechanical intubation, chronic systolic and diastolic CHF (EF estimated at 30%), CAD, type II DM, hyperlipidemia, hypertension, ESRD on hemodialysis, anemia of chronic disease, history of stroke.  She presented to the hospital or shortness of breath and generalized weakness.  ? ?Pt admitted with acute hypoxic respiratory failure and suspected severe sepsis from pneumonia and probable aspiration pneumonia.  ? ?Reviewed I/O's: +237 ml x 24 hours and -695 ml since admission ? ?Spoke with pt at bedside, who reports that she is not feeling well today. She reports that she is on a specific DM regimen at home and has difficulty controlling her CBGS at baseline. Pt reports that she consumes 3 meals and 3 snacks daily to assist with hypoglycemia. She also reports that her meals are sometimes in accurate. Obtained pt food preferences and added snacks and managers check to trays.  ? ?Current diet order is renal/ carb modified with 1.2 L fluid restriction, patient is consuming approximately n/a% of meals at this time. Labs and medications reviewed.  ? ?No nutrition interventions warranted at this time. If nutrition issues arise, please consult RD.  ? ?Loistine Chance, RD, LDN, CDCES ?Registered Dietitian II ?Certified Diabetes Care and Education Specialist ?Please refer to [facility] for RD and/or RD on-call/weekend/after hours pager   ?

## 2022-01-24 NOTE — Progress Notes (Signed)
? ? ? ?Progress Note  ? ? ?Deanna [name]  FKC:[id number] DOB: 1951-03-17  DOA: 01/20/2022 ?PCP: [facility] [place]  ? ? ? ? ?Brief Narrative:  ? ? ?Medical records reviewed and are as summarized below: ? ?Deanna [name] is a 71 y.o. 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  She presented to the hospital again on 01/20/2022 for shortness of breath and generalized weakness.  She did not go to outpatient dialysis on the day that she presented to the ED.  Her oxygen saturation was 88% on room air. ? ? ? ? ?Assessment/Plan:  ? ?Principal Problem: ?  SOB (shortness of breath) ?Active Problems: ?  HFrEF (heart failure with reduced ejection fraction) ([place]) ?  Severe sepsis ([place]) ?  Aspiration pneumonia ([place]) ?  Hypoglycemia associated with diabetes ([place]) ?  Type 2 diabetes mellitus without complications ([place]) ?  Elevated troponin ?  Chest pain ?  Essential (primary) hypertension ?  Anemia ?  End-stage renal disease on hemodialysis ([place]) ?  Shortness of breath ? ? ?Acute hypoxic respiratory failure: Improved.  She is tolerating room air. ? ?Suspected severe sepsis from pneumonia, probable aspiration pneumonia: Continue clindamycin.  (CT chest showed patulous appearance of the esophagus which is debris-filled and changes in the right lung base concerning for aspiration).  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 ? ?Hypoglycemia in the setting of recent poor oral intake, type II DM: Glucose levels have stabilized.  She is not getting much insulin in the hospital yet her glucose levels have been okay.  She is probably getting too much insulin  at home.  Patient has been advised to avoid insulin at discharge.  Follow-up with was strongly recommended for further instructions on diabetic management. ? Patient states she takes NovoLog 15 units in the morning, 20 units in the afternoon and 25 units in the evening, she also takes Lantus 65 units nightly.  She has not had problems with this regimen for years.  ? ?ESRD, secondary hyperparathyroidism, anemia of chronic disease: Plan for hemodialysis today.  Nephrologist. ? ?Elevated troponin: This is likely from demand ischemia.  No chest pain. ? ?Chronic systolic and diastolic CHF probably with acute exacerbation from missed outpatient dialysis.   ? ?Debility: Plan to discharge home with home health therapy as recommended by PT and OT. ? ?Other comorbidities include CAD, hypertension, hyperlipidemia, recent intubation for angioedema from lisinopril/Entresto ? ? She does not feel that she is ready for discharge to home today.  She says she does not want to go home and come right back to the hospital.  Plan for discharge to home tomorrow. ? ? ? ? ?Diet Order   ? ?       ?  Diet renal/carb modified with fluid restriction Diet-HS Snack? Nothing; Fluid restriction: 1200 mL Fluid; Room service appropriate? Yes with Assist; Fluid consistency: Thin  Diet effective now       ?  ? ?  ?  ? ?  ? ? ? ? ? ? ? ? ?Consultants: ?Nephrologist ? ?Procedures: ?None ? ? ? ?Medications:  ? ? acetaminophen      ? aspirin EC  81 mg Oral Daily  ? atorvastatin  40 mg Oral Daily  ?  Chlorhexidine Gluconate Cloth  6 each Topical Q0600  ? clindamycin  300 mg Oral Q8H  ? clopidogrel  75 mg Oral Daily  ? dextrose  1 ampule Intravenous Once  ? epoetin alfa      ? epoetin (EPOGEN/PROCRIT) injection  4,000 Units Intravenous Q T,Th,Sa-HD  ? ferrous sulfate  325 mg Oral q AM  ? gabapentin  200 mg Oral TID  ? heparin injection (subcutaneous)  5,000 Units Subcutaneous Q8H  ? heparin sodium (porcine)      ? insulin aspart  0-6 Units Subcutaneous TID WC   ? metoprolol tartrate  25 mg Oral BID  ? sodium chloride flush  3 mL Intravenous Q12H  ? ?Continuous Infusions: ? ? ? ? ?Anti-infectives (From admission, onward)  ? ? Start     Dose/Rate Route Frequency Ordered Stop  ? 01/23/22 1200  vancomycin (VANCOCIN) IVPB 1000 mg/200 mL premix  Status:  Discontinued       ? 1,000 mg ?200 mL/hr over 60 Minutes Intravenous Every M-W-F (Hemodialysis) 01/20/22 2322 01/22/22 1201  ? 01/22/22 1400  clindamycin (CLEOCIN) capsule 300 mg       ? 300 mg Oral Every 8 hours 01/22/22 1202 01/27/22 1359  ? 01/21/22 1600  vancomycin (VANCOCIN) IVPB 1000 mg/200 mL premix       ? 1,000 mg ?200 mL/hr over 60 Minutes Intravenous  Once 01/21/22 1354 01/21/22 1858  ? 01/20/22 2315  ceFEPIme (MAXIPIME) 1 g in sodium chloride 0.9 % 100 mL IVPB  Status:  Discontinued       ? 1 g ?200 mL/hr over 30 Minutes Intravenous Every 24 hours 01/20/22 2305 01/22/22 1201  ? 01/20/22 2300  vancomycin (VANCOCIN) IVPB 1000 mg/200 mL premix  Status:  Discontinued       ? 1,000 mg ?200 mL/hr over 60 Minutes Intravenous  Once 01/20/22 2254 01/20/22 2257  ? 01/20/22 2300  vancomycin (VANCOREADY) IVPB 750 mg/150 mL       ? 750 mg ?150 mL/hr over 60 Minutes Intravenous  Once 01/20/22 2258 01/21/22 0430  ? 01/20/22 1945  vancomycin (VANCOCIN) IVPB 1000 mg/200 mL premix       ? 1,000 mg ?200 mL/hr over 60 Minutes Intravenous  Once 01/20/22 1937 01/20/22 2122  ? 01/20/22 1945  ceFEPIme (MAXIPIME) 2 g in sodium chloride 0.9 % 100 mL IVPB       ? 2 g ?200 mL/hr over 30 Minutes Intravenous  Once 01/20/22 1937 01/20/22 2018  ? ?  ? ? ? ? ? ? ? ? ? ?Family Communication/Anticipated D/C date and plan/Code Status  ? ?DVT prophylaxis: heparin injection 5,000 Units Start: 01/23/22 1400 ? ? ?  Code Status: Full Code ? ?Family Communication: None ?Disposition Plan: Plan to discharge home tomorrow ? ? ? ? ? ? ? ? ?Subjective:  ? ?Interval events noted.  She complains of fatigue.  She said she feels "out of it".  She does not feel that  she is ready for discharge to home today.  She says she does not want to go home and come right back to the hospital.  Katha Cabal, RN, was at the bedside. ? ? ?Objective:  ? ? ?Vitals:  ? 01/24/22 1339 01/24/22 1345 01/24/22 1400 01/24/22 1413  ?BP:  (!) 171/108 (!) 156/67 (!) 162/69  ?Pulse: 79 85 73 72  ?Resp: (!) '21 18 19 16  '$ ?Temp:    98.3 ?F (36.8 ?C)  ?TempSrc:    Oral  ?SpO2: 100% 100% 100% 100%  ?  Weight:    82.2 kg  ?Height:      ? ?No data found. ? ? ?Intake/Output Summary (Last 24 hours) at 01/24/2022 1436 ?Last data filed at 01/24/2022 1413 ?Gross per 24 hour  ?Intake 237 ml  ?Output 1001 ml  ?Net -764 ml  ? ?Filed Weights  ? 01/24/22 0500 01/24/22 1028 01/24/22 1413  ?Weight: 82.1 kg 82.9 kg 82.2 kg  ? ? ?Exam: ? ?GEN: NAD ?SKIN: No rash ?EYES: EOMI ?ENT: MMM ?CV: RRR ?PULM: CTA B ?ABD: soft, ND, NT, +BS ?CNS: AAO x 3, non focal ?EXT: No edema or tenderness ? ? ? ? ?  ? ? ?Data Reviewed:  ? ?I have personally reviewed following labs and imaging studies: ? ?Labs: ?Labs show the following:  ? ?Basic Metabolic Panel: ?Recent Labs  ?Lab 01/17/22 ?1637 01/18/22 ?0500 01/18/22 ?0500 01/19/22 ?0530 01/20/22 ?1922 01/21/22 ?0234 01/21/22 ?3244 01/23/22 ?0102  ?NA  --  135   < > 135 139 138 137 134*  ?K  --  4.9   < > 3.7 4.7 3.6 3.7 4.0  ?CL  --  96*   < > 99 101 102 100 96*  ?CO2  --  25   < > 27 21* '26 27 27  '$ ?GLUCOSE  --  192*   < > 224* 120* 49* 115* 182*  ?BUN  --  48*   < > 31* 50* 52* 52* 41*  ?CREATININE  --  3.31*   < > 2.32* 3.46* 3.56* 3.42* 3.16*  ?CALCIUM  --  7.7*   < > 7.7* 8.8* 8.4* 8.2* 7.9*  ?MG 2.0 2.0  --  1.7  --   --  1.8  --   ?PHOS  --  6.2*  --  3.7  --  5.6*  --  5.3*  ? < > = values in this interval not displayed.  ? ?GFR ?Estimated Creatinine Clearance: 19.7 mL/min (A) (by C-G formula based on SCr of 3.16 mg/dL (H)). ?Liver Function Tests: ?Recent Labs  ?Lab 01/20/22 ?1922 01/21/22 ?0234 01/21/22 ?7253 01/23/22 ?6644  ?AST 69*  --  72*  --   ?ALT 81*  --  76*  --   ?ALKPHOS 394*  --  314*  --    ?BILITOT 0.9  --  0.6  --   ?PROT 7.3  --  7.1  --   ?ALBUMIN 2.9* 2.7* 2.6* 2.3*  ? ?No results for input(s): LIPASE, AMYLASE in the last 168 hours. ?No results for input(s): AMMONIA in the last

## 2022-01-25 DIAGNOSIS — N186 End stage renal disease: Secondary | ICD-10-CM | POA: Diagnosis not present

## 2022-01-25 DIAGNOSIS — J690 Pneumonitis due to inhalation of food and vomit: Secondary | ICD-10-CM | POA: Diagnosis not present

## 2022-01-25 DIAGNOSIS — I10 Essential (primary) hypertension: Secondary | ICD-10-CM | POA: Diagnosis not present

## 2022-01-25 DIAGNOSIS — Z992 Dependence on renal dialysis: Secondary | ICD-10-CM | POA: Diagnosis not present

## 2022-01-25 LAB — CULTURE, BLOOD (ROUTINE X 2)
Culture: NO GROWTH
Culture: NO GROWTH

## 2022-01-25 LAB — RENAL FUNCTION PANEL
Albumin: 2.4 g/dL — ABNORMAL LOW (ref 3.5–5.0)
Anion gap: 10 (ref 5–15)
BUN: 30 mg/dL — ABNORMAL HIGH (ref 8–23)
CO2: 27 mmol/L (ref 22–32)
Calcium: 8.6 mg/dL — ABNORMAL LOW (ref 8.9–10.3)
Chloride: 99 mmol/L (ref 98–111)
Creatinine, Ser: 2.25 mg/dL — ABNORMAL HIGH (ref 0.44–1.00)
GFR, Estimated: 23 mL/min — ABNORMAL LOW (ref >60)
Glucose, Bld: 183 mg/dL — ABNORMAL HIGH (ref 70–99)
Phosphorus: 4.3 mg/dL (ref 2.5–4.6)
Potassium: 4.6 mmol/L (ref 3.5–5.1)
Sodium: 136 mmol/L (ref 135–145)

## 2022-01-25 LAB — CBC
HCT: 27.6 % — ABNORMAL LOW (ref 36.0–46.0)
Hemoglobin: 8.6 g/dL — ABNORMAL LOW (ref 12.0–15.0)
MCH: 27 pg (ref 26.0–34.0)
MCHC: 31.2 g/dL (ref 30.0–36.0)
MCV: 86.5 fL (ref 80.0–100.0)
Platelets: 289 10*3/uL (ref 150–400)
RBC: 3.19 MIL/uL — ABNORMAL LOW (ref 3.87–5.11)
RDW: 14.9 % (ref 11.5–15.5)
WBC: 7.1 10*3/uL (ref 4.0–10.5)
nRBC: 0.3 % — ABNORMAL HIGH (ref 0.0–0.2)

## 2022-01-25 LAB — GLUCOSE, CAPILLARY
Glucose-Capillary: 164 mg/dL — ABNORMAL HIGH (ref 70–99)
Glucose-Capillary: 135 mg/dL — ABNORMAL HIGH (ref 70–99)
Glucose-Capillary: 237 mg/dL — ABNORMAL HIGH (ref 70–99)
Glucose-Capillary: 127 mg/dL — ABNORMAL HIGH (ref 70–99)

## 2022-01-25 MED ORDER — OXYCODONE-ACETAMINOPHEN 5-325 MG PO TABS
1.0000 | ORAL_TABLET | Freq: Four times a day (QID) | ORAL | Status: DC | PRN
  Administered 2022-01-25: 1 via ORAL
  Filled 2022-01-25: qty 1

## 2022-01-25 NOTE — Plan of Care (Signed)

## 2022-01-25 NOTE — Progress Notes (Signed)
?[facility]  ?PROGRESS NOTE  ? ?Subjective:  ? ?Patient seen sitting up in bed, completed breakfast tray at bedside ?Alert and oriented however feels "drunk" from medications. ?Complaining of dizziness ?Denies nausea and vomiting ? ?Objective:  ?Vital signs: ?Blood pressure (!) 145/73, pulse 82, temperature 98 ?F (36.7 ?C), resp. rate 16, height '6\' 1"'$  (1.854 m), weight 82.1 kg, SpO2 99 %. ? ?Intake/Output Summary (Last 24 hours) at 01/25/2022 1115 ?Last data filed at 01/25/2022 0751 ?Gross per 24 hour  ?Intake 600 ml  ?Output 1001 ml  ?Net -401 ml  ? ? ?Filed Weights  ? 01/24/22 1028 01/24/22 1413 01/25/22 0100  ?Weight: 82.9 kg 82.2 kg 82.1 kg  ? ? ? ?Physical Exam: ?General:  No acute distress  ?Head:  Normocephalic, atraumatic. Moist oral mucosal membranes  ?Eyes:  Anicteric  ?Lungs:  Right-sided wheeze, normal effort  ?Heart:  S1S2 no rubs  ?Abdomen:   Soft, nontender, bowel sounds present  ?Extremities: trace peripheral edema.  ?Neurologic:  Awake, alert, following commands  ?Skin:  No lesions  ?Access: Right PermCath  ? ? ?Basic Metabolic Panel: ?Recent Labs  ?Lab 01/19/22 ?0530 01/20/22 ?1922 01/21/22 ?0234 01/21/22 ?1740 01/23/22 ?8144 01/25/22 ?0930  ?NA 135 139 138 137 134* 136  ?K 3.7 4.7 3.6 3.7 4.0 4.6  ?CL 99 101 102 100 96* 99  ?CO2 27 21* '26 27 27 27  '$ ?GLUCOSE 224* 120* 49* 115* 182* 183*  ?BUN 31* 50* 52* 52* 41* 30*  ?CREATININE 2.32* 3.46* 3.56* 3.42* 3.16* 2.25*  ?CALCIUM 7.7* 8.8* 8.4* 8.2* 7.9* 8.6*  ?MG 1.7  --   --  1.8  --   --   ?PHOS 3.7  --  5.6*  --  5.3* 4.3  ? ? ? ?CBC: ?Recent Labs  ?Lab 01/19/22 ?0530 01/20/22 ?1922 01/21/22 ?0234 01/21/22 ?0454 01/21/22 ?1445 01/23/22 ?1045 01/25/22 ?0930  ?WBC 9.4 23.2* 13.5* 12.5* 9.1 6.6 7.1  ?NEUTROABS 7.9* 19.2*  --   --   --   --   --   ?HGB 8.8* 11.1* 9.9* 9.1* 8.3* 8.6* 8.6*  ?HCT 28.8* 37.8 33.7* 30.8* 27.1* 28.5* 27.6*  ?MCV 88.3 92.0 91.1 90.3 86.0 89.3 86.5  ?PLT 225 266 243 228 228 302 289  ? ? ? ? ?Urinalysis: ?No results for  input(s): COLORURINE, LABSPEC, [place], GLUCOSEU, HGBUR, BILIRUBINUR, KETONESUR, PROTEINUR, UROBILINOGEN, NITRITE, LEUKOCYTESUR in the last 72 hours. ? ?Invalid input(s): APPERANCEUR  ? ? ?Imaging: ?DG ESOPHAGUS W SINGLE CM (SOL OR THIN BA) ? ?Result Date: 01/23/2022 ?CLINICAL DATA:  Some difficulty swallowing.  Pneumonia. EXAM: ESOPHOGRAM/BARIUM SWALLOW TECHNIQUE: Single contrast examination was performed using  thin barium. FLUOROSCOPY: Radiation Exposure Index (as provided by the fluoroscopic device): 42 seconds. 22.90 MGy [place] COMPARISON:  None. FINDINGS: Patient was difficult to position for the examination. A sufficient examination was achieved. [place] pharyngeal phase is normal. The patient had no difficulty with initiation. There is no penetration or aspiration. No cardiac a pharyngeal stenosis. No diverticular disease. No obstructing lesion of the cervical or thoracic esophagus. 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 No hiatal hernia. IMPRESSION: Negative study for age. No penetration or aspiration. No fixed stenosis. No hiatal hernia. 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 Electronically Signed   By: Nelson Chimes M.D.   On: 01/23/2022 15:54   ? ? ?Medications:  ? ? ? aspirin EC  81 mg Oral Daily  ? atorvastatin  40 mg Oral Daily  ? Chlorhexidine Gluconate Cloth  6 each Topical Q0600  ?  clindamycin  300 mg Oral Q8H  ? clopidogrel  75 mg Oral Daily  ? dextrose  1 ampule Intravenous Once  ? epoetin (EPOGEN/PROCRIT) injection  4,000 Units Intravenous Q T,Th,Sa-HD  ? ferrous sulfate  325 mg Oral q AM  ? gabapentin  200 mg Oral TID  ? heparin injection (subcutaneous)  5,000 Units Subcutaneous Q8H  ? insulin aspart  0-6 Units Subcutaneous TID WC  ? metoprolol tartrate  25 mg Oral BID  ? pantoprazole  40 mg Oral Daily  ? sodium chloride flush  3 mL Intravenous Q12H  ? ? ?Assessment/ Plan:  ?   ?Principal Problem: ?  SOB (shortness of breath) ?Active Problems: ?  Essential (primary) hypertension ?  HFrEF (heart  failure with reduced ejection fraction) ([place]) ?  Type 2 diabetes mellitus without complications ([place]) ?  Anemia ?  Elevated troponin ?  Severe sepsis ([place]) ?  Chest pain ?  Hypoglycemia associated with diabetes ([place]) ?  End-stage renal disease on hemodialysis ([place]) ?  Aspiration pneumonia ([place]) ?  Shortness of breath ? ?71 year old female with history of diabetes, hypertension, coronary artery disease, congestive heart failure, status post CVA, ESRD on hemodialysis now admitted with history of shortness of breath.  She was found to have hypoglycemia and angioedema and is presently improving. ? ?CC [facility]/TTS/right PermCath/82 kg ?  ?#1: End stage renal disease on hemodialysis Will maintain outpatient schedule.  ? Dialysis received yesterday 1 L achieved.  Next treatment scheduled for tomorrow. ?  ?2: Anemia: Patient is anemia secondary to chronic kidney disease.:  Hgb remains 8.6.  Low dose EPO with treatments. ?  ?#3: Secondary hyperparathyroidism: Patient is elevated PTH levels.  ? Calcium and phosphorus remain within acceptable range for this patient. ?  ?#4: Pneumonia/sepsis: Clindamycin prescribed by primary team. ?  ?#5: Hypoglycemia: Resolved with adequate treatment ?  ?#6: Hypertension:  Continue the metoprolol at the present doses ? Blood pressure currently 145/73 ? ?#7: Diabetes mellitus type II with chronic kidney disease ?insulin dependent. Home regimen includes Lantus and Novolog. Most recent hemoglobin A1c is 5.5 on 12/27/21.  ? Appreciate diabetes coordinator reaching out to family to evaluate and reinforce diabetes management. ? ? LOS: 5 ?[place] ?[facility] ?5/3/202311:15 AM ?  ?

## 2022-01-25 NOTE — Progress Notes (Signed)
Inpatient Diabetes Program Recommendations ? ?AACE/ADA: New Consensus Statement on Inpatient Glycemic Control (2015) ? ?Target Ranges:  Prepandial:   less than 140 mg/dL ?     Peak postprandial:   less than 180 mg/dL (1-2 hours) ?     Critically ill patients:  140 - 180 mg/dL  ? ? Latest Reference Range & Units 01/23/22 08:01 01/23/22 11:31 01/23/22 16:02 01/23/22 16:57 01/23/22 20:31 01/23/22 22:32  ?Glucose-Capillary 70 - 99 mg/dL 177 (H) 88 129 (H) 131 (H) 279 (H) 217 (H)  ? ? Latest Reference Range & Units 01/24/22 07:20 01/24/22 16:16 01/24/22 21:02  ?Glucose-Capillary 70 - 99 mg/dL 188 (H) ? ?1 unit Novolog ? 129 (H) 149 (H)  ? ? Latest Reference Range & Units 01/25/22 07:53 01/25/22 11:52  ?Glucose-Capillary 70 - 99 mg/dL 164 (H) ? ?1 unit Novolog ? 135 (H)  ? ? ?Admit 12/26/2021 with Weakness and discharged 01/13/2022 ?Back to ED 04/22/23023 and admitted with Angioedema--discharged 01/19/2022 ?Back to ED 01/20/2022 and admitted with SOB/ Pneumonia/ Sepsis ? ?History: DM, CVA ? ?Home DM Meds: Novolog 15 units Breakfast/ 20 units Lunch/ 25 units Dinner ?      Lantus 65 units QHS ? ?Current Orders: Novolog 0-6 units TID ? ? ?No Hypoglycemia noted since 04/29 (CBG was 32 at 3am). ? ?CBGs have been relatively stable since 04/29 ? ?Suprisingly, CBGs are OK without any basal insulin at this time (takes fair amount of Lantus and Novolog at home)--Note that PO intake appears poor given PO documentation ? ?Will follow ? ? ? ?--Will follow patient during hospitalization-- ? ?Wyn Quaker RN, MSN, CDE ?Diabetes Coordinator ?Inpatient Glycemic Control Team ?Team Pager: [phone] (8a-5p) ? ? ?

## 2022-01-25 NOTE — Progress Notes (Signed)
Physical Therapy Treatment ?Patient Details ?Name: Deanna [name] ?MRN: [id number] ?DOB: [birth year] ?Today's Date: 01/25/2022 ? ? ?History of Present Illness Pt is a 71 y.o. female presenting to hospital 4/28 with c/o SOB associated with generalized weakness and fever.  Recent hospital admission and discharge for angioedema requiring intubation.  Pt now admitted with acute hypoxic respiratory failure, suspected sepsis from PNA, hypoglycemia, elevated troponin (likely from demand ischemia), and chronic systolic and diastolic CHF.  PMH includes CHF, CAD, DM, HLD, htn, ESRD on dialysis, anemia, and stroke. ? ?  ?PT Comments  ? ? Pt was long sitting in bed upon arriving. She is A and O x 4 and agreeable to session. Endorses that she felt  over medicated earlier. " I fel like I was drunk." No longer reporting feeling this way.   She was able to exit bed, stand and ambulate short distances. Poor posture throughout all standing activity." I've been like that for while."  Pt is unwilling to go to rehab however is agreeable to HHPT. She will continue to benefit from skilled PT at DC to address deficits while maximizing independence with ADLs.  ?  ?Recommendations for follow up therapy are one component of a multi-disciplinary discharge planning process, led by the attending physician.  Recommendations may be updated based on patient status, additional functional criteria and insurance authorization. ? ?Follow Up Recommendations ? Home health PT (pt is unwilling to go to rehab) ?  ?  ?Assistance Recommended at Discharge Frequent or constant Supervision/Assistance  ?Patient can return home with the following A little help with walking and/or transfers;A little help with bathing/dressing/bathroom;Assistance with cooking/housework;Assist for transportation;Help with stairs or ramp for entrance ?  ?Equipment Recommendations ? Rolling walker (2 wheels);BSC/3in1;Wheelchair (measurements PT);Wheelchair cushion (measurements PT) (pt  would benefit from w/c or power chair for longer distances. she has HD 3 x a week and may not be able to ambulate well on HD days.)  ?  ?   ?Precautions / Restrictions Precautions ?Precautions: Fall ?Restrictions ?Weight Bearing Restrictions: No  ?  ? ?Mobility ? Bed Mobility ?Overal bed mobility: Needs Assistance ?Bed Mobility: Supine to Sit ?  ?  ?Supine to sit: Supervision, HOB elevated ?  ?  ?General bed mobility comments: no physical assistance required ?  ? ?Transfers ?Overall transfer level: Needs assistance ?Equipment used: Rolling walker (2 wheels) ?Transfers: Sit to/from Stand ?Sit to Stand: Min guard ?  ?  ?  ?  ?  ?General transfer comment: CGA for safety on first trial. supervision + vcs for 2nd/3rd STS ?  ? ?Ambulation/Gait ?Ambulation/Gait assistance: Supervision ?Gait Distance (Feet): 80 Feet ?Assistive device: Rolling walker (2 wheels) ?Gait Pattern/deviations: Step-to pattern, Decreased step length - left, Decreased step length - right, Trunk flexed, Narrow base of support ?Gait velocity: decreased ?  ?  ?General Gait Details: pt ambulates with very poor gait posture. no LOB or unsteadiness. several standing rest breaks ? ? ?  ?Balance Overall balance assessment: Needs assistance ?Sitting-balance support: No upper extremity supported, Feet supported ?Sitting balance-[name]: Good ?  ?  ?Standing balance support: Bilateral upper extremity supported, During functional activity, Reliant on assistive device for balance ?Standing balance-[name]: Fair ?Standing balance comment: forward flexed posture with pt requiring vcs for upright standing ?  ?  ?  ?Cognition Arousal/Alertness: Awake/alert ?Behavior During Therapy: [facility] for tasks assessed/performed ?Overall Cognitive Status: Within Functional Limits for tasks assessed ?  ?  ?   ?General Comments: Pt is A and O x 4.  Seems to be good historian of past medical hx. Pt is unwilling to go to rehab at DC. ?  ?  ? ?  ?   ?General Comments General  comments (skin integrity, edema, etc.): no c/o dizziness or feelings of being "drunk" ?  ?  ? ?Pertinent Vitals/Pain Pain Assessment ?Pain Assessment: No/denies pain  ? ? ? ?PT Goals (current goals can now be found in the care plan section) Acute Rehab PT Goals ?Patient Stated Goal: improve strength and mobility ?Progress towards PT goals: Progressing toward goals ? ?  ?Frequency ? ? ? Min 2X/week ? ? ? ?  ?PT Plan Current plan remains appropriate  ? ? ?   ?AM-PAC PT "6 Clicks" Mobility   ?Outcome Measure ? Help needed turning from your back to your side while in a flat bed without using bedrails?: A Little ?Help needed moving from lying on your back to sitting on the side of a flat bed without using bedrails?: A Little ?Help needed moving to and from a bed to a chair (including a wheelchair)?: A Little ?Help needed standing up from a chair using your arms (e.g., wheelchair or bedside chair)?: A Little ?Help needed to walk in hospital room?: A Little ?Help needed climbing 3-5 steps with a railing? : A Lot ?6 Click Score: 17 ? ?  ?End of Session Equipment Utilized During Treatment: Gait belt ?Activity Tolerance: Patient tolerated treatment well ?Patient left: in chair;with call bell/phone within reach;with chair alarm set ?Nurse Communication: Mobility status;Precautions ?PT Visit Diagnosis: Muscle weakness (generalized) (M62.81);Difficulty in walking, not elsewhere classified (R26.2);History of falling (Z91.81) ?  ? ? ?Time: 1328-1400 ?PT Time Calculation (min) (ACUTE ONLY): 32 min ? ?Charges:  $Gait Training: 8-22 mins ?$Therapeutic Activity: 8-22 mins          ?          ?Julaine Fusi PTA ?01/25/22, 3:15 PM  ? ?

## 2022-01-25 NOTE — Progress Notes (Addendum)
?  ?  Durable Medical Equipment  ?(From admission, onward)  ?  ? ? ?  ? ?  Start     Ordered  ? 01/25/22 1425  For home use only DME Wheelchair electric  Once       ?Comments: Hx of CVA, ESRD on HD, generalized weakness  ? 01/25/22 1424  ? 01/25/22 1424  For home use only DME Hospital bed  Once       ?Question Answer Comment  ?Length of Need 12 Months   ?Patient has (list medical condition): CVA, ESRD on HD   ?Bed type Semi-electric   ?Hoyer Lift Yes   ?  ? 01/25/22 1423  ? ?  ?  ? ?  ? ?Due to the aforementioned diagnosis patient needs a hospital bed to assist with frequent repositioning which is not achievable in a normal bed. ? Pricilla Riffle, [place] ?[phone] ? ? ?

## 2022-01-25 NOTE — TOC Progression Note (Addendum)
Transition of Care (TOC) - Progression Note  ? ? ?Patient Details  ?Name: Deanna [name] ?MRN: [id number] ?Date of Birth: 26-Nov-1950 ? ?Transition of Care (TOC) CM/SW Contact  ?Alberteen Sam, LCSW ?Phone Number: ?01/25/2022, 2:13 PM ? ?Clinical Narrative:    ? ?CSW spoke with patient's granddaughter [name] who reports DME needs for electric wheel chair, hospital bed and hoyer lift.  ? ?Patient will be followed by [facility] PT OT and RN at time of discharge.  ? ?CSW is working with Thedore Mins with [employer] to order dme needs prior to dc.  ? ? ?Expected Discharge Plan: [place] ?Barriers to Discharge: Continued Medical Work up ? ?Expected Discharge Plan and Services ?Expected Discharge Plan: [place] ?  ?  ?  ?Living arrangements for the past 2 months: [employer] ?                ?  ?  ?  ?  ?  ?HH Arranged: PT, OT, RN ?[place] Agency: [place] ?Date HH Agency Contacted: 01/23/22 ?Time [place]: [phone] ?Representative spoke with at [place]: [place] ? ? ?Social Determinants of Health (SDOH) Interventions ?  ? ?Readmission Risk Interventions ? ?  01/16/2022  ?  3:45 PM 12/29/2021  ?  2:33 PM  ?Readmission Risk Prevention Plan  ?Transportation Screening Complete Complete  ?PCP or Specialist Appt within 5-7 Days  Complete  ?Home Care Screening  Complete  ?Medication Review (RN CM)  Complete  ?Medication Review Press photographer) Complete   ?PCP or Specialist appointment within 3-5 days of discharge Complete   ?[place] or Home Care Consult Complete   ?SW Recovery Care/Counseling Consult Complete   ?Palliative Care Screening Not Applicable   ?[place] Not Applicable   ? ? ?

## 2022-01-25 NOTE — Progress Notes (Signed)
?PROGRESS NOTE ? ? ? ?Deanna [name]  WUJ:[id number] DOB: 02-15-1951 DOA: 01/20/2022 ?[facility]P: [facility] [place] ? ?Assessment & Plan: ?  ?Principal Problem: ?  SOB (shortness of breath) ?Active Problems: ?  HFrEF (heart failure with reduced ejection fraction) ([place]) ?  Severe sepsis ([place]) ?  Aspiration pneumonia ([place]) ?  Hypoglycemia associated with diabetes ([place]) ?  Type 2 diabetes mellitus without complications ([place]) ?  Elevated troponin ?  Chest pain ?  Essential (primary) hypertension ?  Anemia ?  End-stage renal disease on hemodialysis ([place]) ?  Shortness of breath ? ?Assessment and Plan: ?Acute hypoxic respiratory failure: resolved ? ?Suspected severe sepsis: likely secondary to aspiration pneumonia. See Dr. Roxana Hires note on how the pt met severe sepsis criteria. Continue clindamycin. 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 ?  ?DM2: w/ hypoglycemic episodes. Has poor oral intake ? ?ESRD: continue on HD as per nephro.  ? ?Secondary hyperparathyroidism: management as per nephro ? ?ACD: likely secondary to ESRD. H&H are labiel  ?  ?Elevated troponin: likely from demand ischemia.  No chest pain. ?  ?Acute on chronic combined CHF: probably with acute exacerbation from missed outpatient dialysis.  Volume management w/ HD  ?  ?Debility: PT/OT recs HH  ?  ?CAD: continue on metoprolol. Cannot tolerate ACE/ARB secondary to hx of angioedema  ? ?HTN: continue on metoprolol  ? ?HLD: continue on statin  ? ? ? ? ? ?DVT prophylaxis: heparin  ?Code Status: full  ?Family Communication: discussed pt's care w/ pt's granddaughter, [name], and answered her questions  ?Disposition Plan: likely d/c back home ? ?Level of care: Progressive ? ?Status is: Inpatient ?Remains inpatient appropriate because: pt c/o "feeling drunk" today  ? ? ? ?Consultants:  ?Nephro  ? ?Procedures:  ? ?Antimicrobials:  ? ? ?Subjective: ?Pt c/o "feeling drunk" today.  ? ?Objective: ?Vitals:  ? 01/25/22 0100  01/25/22 0500 01/25/22 0750 01/25/22 1250  ?BP:  (!) 149/77 (!) 145/73 (!) 157/68  ?Pulse:  86 82 80  ?Resp:  _0 ?Temp:  98.8 ?F (37.1 ?C) 98 ?F (36.7 ?C) 97.9 ?F (36.6 ?C)  ?TempSrc:  Oral    ?SpO2:  100% 99% 97%  ?Weight: 82.1 kg     ?Height:      ? ? ?Intake/Output Summary (Last 24 hours) at 01/25/2022 1328 ?Last data filed at 01/25/2022 0751 ?Gross per 24 hour  ?Intake 600 ml  ?Output 1001 ml  ?Net -401 ml  ? ?Filed Weights  ? 01/24/22 1028 01/24/22 1413 01/25/22 0100  ?Weight: 82.9 kg 82.2 kg 82.1 kg  ? ? ?Examination: ? ?General exam: Appears uncomfortable  ?Respiratory system: diminished breath sounds b/l  ?Cardiovascular system: S1 & S2+. No rubs, gallops or clicks. ?Gastrointestinal system: Abdomen is nondistended, soft and nontender. Normal bowel sounds heard. ?Central nervous system: Alert and oriented. Moves all extremities  ?Psychiatry: Judgement and insight appear not a baseline. Anxious mood and affect ? ? ? ?Data Reviewed: I have personally reviewed following labs and imaging studies ? ?CBC: ?Recent Labs  ?Lab 01/19/22 ?0530 01/20/22 ?1922 01/21/22 ?0234 01/21/22 ?0454 01/21/22 ?1445 01/23/22 ?1045 01/25/22 ?0930  ?WBC 9.4 23.2* 13.5* 12.5* 9.1 6.6 7.1  ?NEUTROABS 7.9* 19.2*  --   --   --   --   --   ?HGB 8.8* 11.1* 9.9* 9.1* 8.3* 8.6* 8.6*  ?HCT 28.8* 37.8 33.7* 30.8* 27.1* 28.5* 27.6*  ?MCV 88.3 92.0 91.1 90.3 86.0 89.3 86.5  ?PLT 225 266  243 228 228 302 289  ? ?Basic Metabolic Panel: ?Recent Labs  ?Lab 01/19/22 ?0530 01/20/22 ?1922 01/21/22 ?0234 01/21/22 ?8333 01/23/22 ?8329 01/25/22 ?0930  ?NA 135 139 138 137 134* 136  ?K 3.7 4.7 3.6 3.7 4.0 4.6  ?CL 99 101 102 100 96* 99  ?CO2 27 21* _0 ?GLUCOSE 224* 120* 49* 115* 182* 183*  ?BUN 31* 50* 52* 52* 41* 30*  ?CREATININE 2.32* 3.46* 3.56* 3.42* 3.16* 2.25*  ?CALCIUM 7.7* 8.8* 8.4* 8.2* 7.9* 8.6*  ?MG 1.7  --   --  1.8  --   --   ?PHOS 3.7  --  5.6*  --  5.3* 4.3  ? ?GFR: ?Estimated Creatinine Clearance: 27.7 mL/min (A) (by C-G formula  based on SCr of 2.25 mg/dL (H)). ?Liver Function Tests: ?Recent Labs  ?Lab 01/20/22 ?1922 01/21/22 ?0234 01/21/22 ?0454 01/23/22 ?1916 01/25/22 ?0930  ?AST 69*  --  72*  --   --   ?ALT 81*  --  76*  --   --   ?ALKPHOS 394*  --  314*  --   --   ?BILITOT 0.9  --  0.6  --   --   ?PROT 7.3  --  7.1  --   --   ?ALBUMIN 2.9* 2.7* 2.6* 2.3* 2.4*  ? ?No results for input(s): LIPASE, AMYLASE in the last 168 hours. ?No results for input(s): AMMONIA in the last 168 hours. ?Coagulation Profile: ?Recent Labs  ?Lab 01/20/22 ?1922  ?INR 1.1  ? ?Cardiac Enzymes: ?No results for input(s): CKTOTAL, CKMB, CKMBINDEX, TROPONINI in the last 168 hours. ?BNP (last 3 results) ?No results for input(s): PROBNP in the last 8760 hours. ?HbA1C: ?No results for input(s): HGBA1C in the last 72 hours. ?CBG: ?Recent Labs  ?Lab 01/24/22 ?0720 01/24/22 ?1616 01/24/22 ?2102 01/25/22 ?6060 01/25/22 ?1152  ?GLUCAP 188* 129* 149* 164* 135*  ? ?Lipid Profile: ?No results for input(s): CHOL, HDL, LDLCALC, TRIG, CHOLHDL, LDLDIRECT in the last 72 hours. ?Thyroid Function Tests: ?No results for input(s): TSH, T4TOTAL, FREET4, T3FREE, THYROIDAB in the last 72 hours. ?Anemia Panel: ?No results for input(s): VITAMINB12, FOLATE, FERRITIN, TIBC, IRON, RETICCTPCT in the last 72 hours. ?Sepsis Labs: ?Recent Labs  ?Lab 01/20/22 ?1922 01/20/22 ?2123  ?PROCALCITON  --  3.59  ?LATICACIDVEN 4.3* 1.7  ? ? ?Recent Results (from the past 240 hour(s))  ?Blood culture (routine x 2)     Status: None  ? Collection Time: 01/20/22  7:22 PM  ? Specimen: BLOOD  ?Result Value Ref Range Status  ? Specimen Description BLOOD BLOOD RIGHT HAND  Final  ? Special Requests   Final  ?  BOTTLES DRAWN AEROBIC AND ANAEROBIC Blood Culture results may not be optimal due to an inadequate volume of blood received in culture bottles  ? Culture   Final  ?  NO GROWTH 5 DAYS ?Performed at [facility], [name], [place],  [address] ?  ? Report Status 01/25/2022 FINAL  Final   ?Blood culture (routine x 2)     Status: None  ? Collection Time: 01/20/22  7:22 PM  ? Specimen: BLOOD  ?Result Value Ref Range Status  ? Specimen Description BLOOD BLOOD LEFT HAND  Final  ? Special Requests   Final  ?  BOTTLES DRAWN AEROBIC AND ANAEROBIC Blood Culture results may not be optimal due to an inadequate volume of blood received in culture bottles  ? Culture   Final  ?  NO GROWTH  5 DAYS ?Performed at [facility], [address], [place], [place] [address] ?  ? Report Status 01/25/2022 FINAL  Final  ?Resp Panel by RT-PCR (Flu A&B, Covid) Nasopharyngeal Swab     Status: None  ? Collection Time: 01/20/22  7:28 PM  ? Specimen: Nasopharyngeal Swab; Nasopharyngeal(NP) swabs in vial transport medium  ?Result Value Ref Range Status  ? SARS Coronavirus 2 by RT PCR NEGATIVE NEGATIVE Final  ?  Comment: (NOTE) ?SARS-CoV-2 target nucleic acids are NOT DETECTED. ? ?The SARS-CoV-2 RNA is generally detectable in upper respiratory ?specimens during the acute phase of infection. The lowest ?concentration of SARS-CoV-2 viral copies this assay can detect is ?138 copies/mL. A negative result does not preclude SARS-Cov-2 ?infection and should not be used as the sole basis for treatment or ?other patient management decisions. A negative result may occur with  ?improper specimen collection/handling, submission of specimen other ?than nasopharyngeal swab, presence of viral mutation(s) within the ?areas targeted by this assay, and inadequate number of viral ?copies(<138 copies/mL). A negative result must be combined with ?clinical observations, patient history, and epidemiological ?information. The expected result is Negative. ? ?Fact Sheet for Patients:  ?EntrepreneurPulse.com.au ? ?Fact Sheet for Healthcare Providers:  ?IncredibleEmployment.be ? ?This test is no t yet approved or cleared by the [place] FDA and  ?has been authorized for detection and/or diagnosis of SARS-CoV-2  by ?FDA under an Emergency Use Authorization (EUA). This EUA will remain  ?in effect (meaning this test can be used) for the duration of the ?COVID-19 declaration under Section 564(b)(1) of the Act, 21 ?U.

## 2022-01-25 NOTE — Progress Notes (Signed)
Occupational Therapy Treatment ?Patient Details ?Name: Deanna [name] ?MRN: [id number] ?DOB: [birth year] ?Today's Date: 01/25/2022 ? ? ?History of present illness Pt is a 71 y.o. female presenting to hospital 4/28 with c/o SOB associated with generalized weakness and fever.  Recent hospital admission and discharge for angioedema requiring intubation.  Pt now admitted with acute hypoxic respiratory failure, suspected sepsis from PNA, hypoglycemia, elevated troponin (likely from demand ischemia), and chronic systolic and diastolic CHF.  PMH includes CHF, CAD, DM, HLD, htn, ESRD on dialysis, anemia, and stroke. ?  ?OT comments ? Chart reviewed to date, pt greeted in bed agreeable to OT tx session. Tx session targeted progressing strength/endurance for improved functional mobility/ADL completion. Progress is noted in all ADL tasks however pt with reported dizziness and double vision throughout positioning changes. VSS throughout with spo2 >90%, HR below 100, BP 150/76 at edge of bed. Pt continues to perform ADL/mobility below PLOF and reports she will discharge home to granddaughters new house with level entry. Pt will continue to benefit from OT to address functional deficits, OT will continue to follow while admitted.   ? ?Recommendations for follow up therapy are one component of a multi-disciplinary discharge planning process, led by the attending physician.  Recommendations may be updated based on patient status, additional functional criteria and insurance authorization. ?   ?Follow Up Recommendations ? Home health OT  ?  ?Assistance Recommended at Discharge Frequent or constant Supervision/Assistance  ?Patient can return home with the following ? Help with stairs or ramp for entrance;Assistance with cooking/housework;A little help with walking and/or transfers;A little help with bathing/dressing/bathroom ?  ?Equipment Recommendations ? None recommended by OT  ?  ?Recommendations for Other Services   ? ?   ?Precautions / Restrictions Precautions ?Precautions: Fall ?Restrictions ?Weight Bearing Restrictions: No  ? ? ?  ? ?Mobility Bed Mobility ?Overal bed mobility: Needs Assistance ?Bed Mobility: Supine to Sit, Sit to Supine ?  ?  ?Supine to sit: Supervision, HOB elevated ?Sit to supine: Supervision, HOB elevated ?  ?  ?  ? ?Transfers ?Overall transfer level: Needs assistance ?Equipment used: Rolling walker (2 wheels) ?Transfers: Sit to/from Stand ?Sit to Stand: Min guard, Min assist ?  ?  ?  ?  ?  ?General transfer comment: 3 attempts,MIN A for first attempt, pt corrects with frequent vcs ?  ?  ?Balance Overall balance assessment: Needs assistance ?Sitting-balance support: No upper extremity supported, Feet supported ?Sitting balance-[name]: Good ?  ?  ?Standing balance support: Bilateral upper extremity supported, During functional activity, Reliant on assistive device for balance ?Standing balance-[name]: Poor ?Standing balance comment: forward flexed posture with pt requiring vcs for upright standing ?  ?  ?  ?  ?  ?  ?  ?  ?  ?  ?  ?   ? ?ADL either performed or assessed with clinical judgement  ? ?ADL Overall ADL's : Needs assistance/impaired ?  ?  ?Grooming: Wash/dry hands;Wash/dry face;Sitting;Set up ?  ?Upper Body Bathing: Sitting;Set up ?  ?  ?  ?Upper Body Dressing : Sitting;Set up ?  ?  ?  ?Toilet Transfer: Ambulation;Rolling walker (2 wheels);Min guard ?Toilet Transfer Details (indicate cue type and reason): simulated ?Toileting- Clothing Manipulation and Hygiene: Minimal assistance;Sit to/from stand ?Toileting - Clothing Manipulation Details (indicate cue type and reason): for thoroughness ?  ?  ?Functional mobility during ADLs: Min guard;Rolling walker (2 wheels) ?  ?  ? ?Extremity/Trunk Assessment   ?  ?  ?  ?  ?  ? ?  Vision   ?  ?  ?Perception   ?  ?Praxis   ?  ? ?Cognition Arousal/Alertness: Awake/alert ?Behavior During Therapy: [facility] for tasks assessed/performed ?Overall Cognitive Status:  Within Functional Limits for tasks assessed ?  ?  ?  ?  ?  ?  ?  ?  ?  ?  ?  ?  ?  ?  ?  ?  ?  ?  ?  ?   ?Exercises   ? ?  ?Shoulder Instructions   ? ? ?  ?General Comments While sitting EOB, pt reporting dizziness and diplopia; BP 150/76; Pt with increased diplopia with saccades testing  ? ? ?Pertinent Vitals/ Pain       Pain Assessment ?Pain Assessment: Faces ?Faces Pain Scale: Hurts a little bit ?Pain Descriptors / Indicators: Headache ?Pain Intervention(s): Limited activity within patient's tolerance, Monitored during session, Repositioned ? ?Home Living   ?  ?  ?  ?  ?  ?  ?  ?  ?  ?  ?  ?  ?  ?  ?  ?  ?  ?  ? ?  ?Prior Functioning/Environment    ?  ?  ?  ?   ? ?Frequency ? Min 2X/week  ? ? ? ? ?  ?Progress Toward Goals ? ?OT Goals(current goals can now be found in the care plan section) ? Progress towards OT goals: Progressing toward goals ? ?Acute Rehab OT Goals ?Patient Stated Goal: go home ?OT Goal Formulation: With patient ?Time For Goal Achievement: 02/08/22 ?Potential to Achieve Goals: Good  ?Plan Discharge plan remains appropriate;Frequency remains appropriate   ? ?Co-evaluation ? ? ?   ?  ?  ?  ?  ? ?  ?AM-PAC OT "6 Clicks" Daily Activity     ?Outcome Measure ? ? Help from another person eating meals?: None ?Help from another person taking care of personal grooming?: A Little ?Help from another person toileting, which includes using toliet, bedpan, or urinal?: A Little ?Help from another person bathing (including washing, rinsing, drying)?: A Little ?Help from another person to put on and taking off regular upper body clothing?: A Little ?Help from another person to put on and taking off regular lower body clothing?: A Little ?6 Click Score: 19 ? ?  ?End of Session Equipment Utilized During Treatment: Rolling walker (2 wheels);Gait belt ? ?OT Visit Diagnosis: History of falling (Z91.81);Muscle weakness (generalized) (M62.81) ?  ?Activity Tolerance Patient tolerated treatment well ?  ?Patient Left in  bed;with call bell/phone within reach;with bed alarm set ?  ?Nurse Communication Mobility status ?  ? ?   ? ?Time: 9826-4158 ?OT Time Calculation (min): 28 min ? ?Charges: OT General Charges ?$OT Visit: 1 Visit ?OT Treatments ?$Self Care/Home Management : 8-22 mins ?$Therapeutic Activity: 8-22 mins ?Shanon Payor, OTD OTR/L  ?01/25/22, 12:58 PM  ?

## 2022-01-26 DIAGNOSIS — I10 Essential (primary) hypertension: Secondary | ICD-10-CM | POA: Diagnosis not present

## 2022-01-26 DIAGNOSIS — N186 End stage renal disease: Secondary | ICD-10-CM | POA: Diagnosis not present

## 2022-01-26 DIAGNOSIS — Z992 Dependence on renal dialysis: Secondary | ICD-10-CM | POA: Diagnosis not present

## 2022-01-26 DIAGNOSIS — J690 Pneumonitis due to inhalation of food and vomit: Secondary | ICD-10-CM | POA: Diagnosis not present

## 2022-01-26 LAB — BASIC METABOLIC PANEL
Anion gap: 9 (ref 5–15)
BUN: 45 mg/dL — ABNORMAL HIGH (ref 8–23)
CO2: 26 mmol/L (ref 22–32)
Calcium: 8.6 mg/dL — ABNORMAL LOW (ref 8.9–10.3)
Chloride: 101 mmol/L (ref 98–111)
Creatinine, Ser: 2.88 mg/dL — ABNORMAL HIGH (ref 0.44–1.00)
GFR, Estimated: 17 mL/min — ABNORMAL LOW (ref >60)
Glucose, Bld: 155 mg/dL — ABNORMAL HIGH (ref 70–99)
Potassium: 4.9 mmol/L (ref 3.5–5.1)
Sodium: 136 mmol/L (ref 135–145)

## 2022-01-26 LAB — CBC
HCT: 27.7 % — ABNORMAL LOW (ref 36.0–46.0)
Hemoglobin: 8.4 g/dL — ABNORMAL LOW (ref 12.0–15.0)
MCH: 26.6 pg (ref 26.0–34.0)
MCHC: 30.3 g/dL (ref 30.0–36.0)
MCV: 87.7 fL (ref 80.0–100.0)
Platelets: 289 10*3/uL (ref 150–400)
RBC: 3.16 MIL/uL — ABNORMAL LOW (ref 3.87–5.11)
RDW: 15.1 % (ref 11.5–15.5)
WBC: 6.5 10*3/uL (ref 4.0–10.5)
nRBC: 0 % (ref 0.0–0.2)

## 2022-01-26 LAB — GLUCOSE, CAPILLARY
Glucose-Capillary: 352 mg/dL — ABNORMAL HIGH (ref 70–99)
Glucose-Capillary: 121 mg/dL — ABNORMAL HIGH (ref 70–99)
Glucose-Capillary: 110 mg/dL — ABNORMAL HIGH (ref 70–99)
Glucose-Capillary: 278 mg/dL — ABNORMAL HIGH (ref 70–99)

## 2022-01-26 MED ORDER — HEPARIN SODIUM (PORCINE) 1000 UNIT/ML IJ SOLN
INTRAMUSCULAR | Status: AC
  Filled 2022-01-26: qty 10

## 2022-01-26 MED ORDER — PNEUMOCOCCAL 20-VAL CONJ VACC 0.5 ML IM SUSY
0.5000 mL | PREFILLED_SYRINGE | INTRAMUSCULAR | Status: DC | PRN
  Filled 2022-01-26: qty 0.5

## 2022-01-26 MED ORDER — EPOETIN ALFA 4000 UNIT/ML IJ SOLN
INTRAMUSCULAR | Status: AC
Start: 2022-01-26 — End: 2022-01-26
  Filled 2022-01-26: qty 1

## 2022-01-26 MED ORDER — HEPARIN SODIUM (PORCINE) 1000 UNIT/ML IJ SOLN
INTRAMUSCULAR | Status: AC
Start: 2022-01-26 — End: 2022-01-26
  Filled 2022-01-26: qty 10

## 2022-01-26 MED ORDER — HYDRALAZINE HCL 50 MG PO TABS
50.0000 mg | ORAL_TABLET | Freq: Four times a day (QID) | ORAL | Status: DC | PRN
  Administered 2022-01-26: 50 mg via ORAL
  Filled 2022-01-26: qty 1

## 2022-01-26 NOTE — Progress Notes (Signed)
Patient returned from Dialysis.  Patient very agitated due to reported missing belonging.   Vital signs obtain and attempted to give oral medications.   Patient would not interact with me while explaining medications, wound not acknowledge the medication on the table in front of her.  Patient continued to talk on the phone.   Encourage patient to take her medication, but she would not acknowledge me again.   Walked away for a moment, told patient I would be right back.  Once I returned to the room, patient had not taken her medications.  Ask patient if she could take her medications to get her blood pressure down.  Patient ignored me again.  As I was walking out of the room, patient told the person on the phone that if the RN does not get out of her face, she was going to "slap the sh** out of me."  She stated she was not going take the medications until she found out what happened to her stuff.   I returned to the room and retrieved the medication to waste them.   Unit director was notified of the situation ?

## 2022-01-26 NOTE — Progress Notes (Signed)
?PROGRESS NOTE ? ? ? ?Deanna [name]  ITG:[id number] DOB: February 28, 1951 DOA: 01/20/2022 ?[facility]P: [facility] [place] ? ?Assessment & Plan: ?  ?Principal Problem: ?  SOB (shortness of breath) ?Active Problems: ?  HFrEF (heart failure with reduced ejection fraction) ([place]) ?  Severe sepsis ([place]) ?  Aspiration pneumonia ([place]) ?  Hypoglycemia associated with diabetes ([place]) ?  Type 2 diabetes mellitus without complications ([place]) ?  Elevated troponin ?  Chest pain ?  Essential (primary) hypertension ?  Anemia ?  End-stage renal disease on hemodialysis ([place]) ?  Shortness of breath ? ?Assessment and Plan: ?Acute hypoxic respiratory failure: resolved ? ?Suspected severe sepsis: likely secondary to aspiration pneumonia. See Dr. Roxana Hires note on how the pt met severe sepsis criteria. Continue clindamycin. 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 ?  ?DM2: w/ hypoglycemic episodes. Intermittently has poor po intake. Continue on SSI w/ accuchecks  ? ?ESRD: continue on HD as per nephro  ? ?Secondary hyperparathyroidism: management as per nephro  ? ?ACD: likely secondary to ESRD. H&H are labile  ?  ?Elevated troponin: likely secondary to demand ischemia  ?  ?Acute on chronic combined CHF: probably with acute exacerbation from missed outpatient dialysis.  Volume management w/ HD  ?  ?Debility: PT/OT recs HH  ?  ?CAD: continue on BB. Cannot tolerate ACE/ARB secondary to hx of angioedema  ? ?HTN: continue on metoprolol  ? ?HLD: continue on statin  ? ? ? ? ? ?DVT prophylaxis: heparin  ?Code Status: full  ?Family Communication: called pt's MPOA, [name], no answer so I left a voicemail  ?Disposition Plan: likely d/c back home ? ?Level of care: Progressive ? ?Status is: Inpatient ?Remains inpatient appropriate because: will likely d/c home tomorrow  ? ? ? ?Consultants:  ?Nephro  ? ?Procedures:  ? ?Antimicrobials:  ? ? ?Subjective: ?Pt c/o fatigue ? ?Objective: ?Vitals:  ? 01/26/22 0544  01/26/22 0700 01/26/22 0738 01/26/22 0751  ?BP: (!) 166/77  (!) 146/66   ?Pulse: 80  89   ?Resp: '18 18 16 ' (!) 30  ?Temp: 98 ?F (36.7 ?C)  99 ?F (37.2 ?C)   ?TempSrc:   Oral   ?SpO2: 100%  98%   ?Weight:      ?Height:      ? ? ?Intake/Output Summary (Last 24 hours) at 01/26/2022 0754 ?Last data filed at 01/25/2022 2124 ?Gross per 24 hour  ?Intake 3 ml  ?Output --  ?Net 3 ml  ? ?Filed Weights  ? 01/24/22 1413 01/25/22 0100 01/26/22 0500  ?Weight: 82.2 kg 82.1 kg 82.2 kg  ? ? ?Examination: ? ?General exam: Appears comfortable  ?Respiratory system: decreased breath sounds b/l  ?Cardiovascular system: S1/S2+. No rubs or clicks  ?Gastrointestinal system: Abd is soft, NT, ND & normal bowel sounds  ?Central nervous system: Alert and oriented. Moves all extremities  ?Psychiatry: judgement and insight appears normal. Flat mood and affect  ? ? ? ?Data Reviewed: I have personally reviewed following labs and imaging studies ? ?CBC: ?Recent Labs  ?Lab 01/20/22 ?1922 01/21/22 ?0234 01/21/22 ?8309 01/21/22 ?1445 01/23/22 ?1045 01/25/22 ?0930 01/26/22 ?4076  ?WBC 23.2*   < > 12.5* 9.1 6.6 7.1 6.5  ?NEUTROABS 19.2*  --   --   --   --   --   --   ?HGB 11.1*   < > 9.1* 8.3* 8.6* 8.6* 8.4*  ?HCT 37.8   < > 30.8* 27.1* 28.5* 27.6* 27.7*  ?MCV 92.0   < >  90.3 86.0 89.3 86.5 87.7  ?PLT 266   < > 228 228 302 289 289  ? < > = values in this interval not displayed.  ? ?Basic Metabolic Panel: ?Recent Labs  ?Lab 01/21/22 ?0234 01/21/22 ?5465 01/23/22 ?0354 01/25/22 ?0930 01/26/22 ?6568  ?NA 138 137 134* 136 136  ?K 3.6 3.7 4.0 4.6 4.9  ?CL 102 100 96* 99 101  ?CO2 '26 27 27 27 26  ' ?GLUCOSE 49* 115* 182* 183* 155*  ?BUN 52* 52* 41* 30* 45*  ?CREATININE 3.56* 3.42* 3.16* 2.25* 2.88*  ?CALCIUM 8.4* 8.2* 7.9* 8.6* 8.6*  ?MG  --  1.8  --   --   --   ?PHOS 5.6*  --  5.3* 4.3  --   ? ?GFR: ?Estimated Creatinine Clearance: 21.6 mL/min (A) (by C-G formula based on SCr of 2.88 mg/dL (H)). ?Liver Function Tests: ?Recent Labs  ?Lab 01/20/22 ?1922 01/21/22 ?0234  01/21/22 ?0454 01/23/22 ?1275 01/25/22 ?0930  ?AST 69*  --  72*  --   --   ?ALT 81*  --  76*  --   --   ?ALKPHOS 394*  --  314*  --   --   ?BILITOT 0.9  --  0.6  --   --   ?PROT 7.3  --  7.1  --   --   ?ALBUMIN 2.9* 2.7* 2.6* 2.3* 2.4*  ? ?No results for input(s): LIPASE, AMYLASE in the last 168 hours. ?No results for input(s): AMMONIA in the last 168 hours. ?Coagulation Profile: ?Recent Labs  ?Lab 01/20/22 ?1922  ?INR 1.1  ? ?Cardiac Enzymes: ?No results for input(s): CKTOTAL, CKMB, CKMBINDEX, TROPONINI in the last 168 hours. ?BNP (last 3 results) ?No results for input(s): PROBNP in the last 8760 hours. ?HbA1C: ?No results for input(s): HGBA1C in the last 72 hours. ?CBG: ?Recent Labs  ?Lab 01/25/22 ?0753 01/25/22 ?1152 01/25/22 ?1623 01/25/22 ?2041 01/26/22 ?0740  ?GLUCAP 164* [place] ?Lipid Profile: ?No results for input(s): CHOL, HDL, LDLCALC, TRIG, CHOLHDL, LDLDIRECT in the last 72 hours. ?Thyroid Function Tests: ?No results for input(s): TSH, T4TOTAL, FREET4, T3FREE, THYROIDAB in the last 72 hours. ?Anemia Panel: ?No results for input(s): VITAMINB12, FOLATE, FERRITIN, TIBC, IRON, RETICCTPCT in the last 72 hours. ?Sepsis Labs: ?Recent Labs  ?Lab 01/20/22 ?1922 01/20/22 ?2123  ?PROCALCITON  --  3.59  ?LATICACIDVEN 4.3* 1.7  ? ? ?Recent Results (from the past 240 hour(s))  ?Blood culture (routine x 2)     Status: None  ? Collection Time: 01/20/22  7:22 PM  ? Specimen: BLOOD  ?Result Value Ref Range Status  ? Specimen Description BLOOD BLOOD RIGHT HAND  Final  ? Special Requests   Final  ?  BOTTLES DRAWN AEROBIC AND ANAEROBIC Blood Culture results may not be optimal due to an inadequate volume of blood received in culture bottles  ? Culture   Final  ?  NO GROWTH 5 DAYS ?Performed at [facility], [address] [place], [place] [address] ?  ? Report Status 01/25/2022 FINAL  Final  ?Blood culture (routine x 2)     Status: None  ? Collection Time: 01/20/22  7:22 PM  ? Specimen: BLOOD   ?Result Value Ref Range Status  ? Specimen Description BLOOD BLOOD LEFT HAND  Final  ? Special Requests   Final  ?  BOTTLES DRAWN AEROBIC AND ANAEROBIC Blood Culture results may not be optimal due to an inadequate volume of blood received in culture bottles  ?  Culture   Final  ?  NO GROWTH 5 DAYS ?Performed at [facility], [address] [place], [place] [address] ?  ? Report Status 01/25/2022 FINAL  Final  ?Resp Panel by RT-PCR (Flu A&B, Covid) Nasopharyngeal Swab     Status: None  ? Collection Time: 01/20/22  7:28 PM  ? Specimen: Nasopharyngeal Swab; Nasopharyngeal(NP) swabs in vial transport medium  ?Result Value Ref Range Status  ? SARS Coronavirus 2 by RT PCR NEGATIVE NEGATIVE Final  ?  Comment: (NOTE) ?SARS-CoV-2 target nucleic acids are NOT DETECTED. ? ?The SARS-CoV-2 RNA is generally detectable in upper respiratory ?specimens during the acute phase of infection. The lowest ?concentration of SARS-CoV-2 viral copies this assay can detect is ?138 copies/mL. A negative result does not preclude SARS-Cov-2 ?infection and should not be used as the sole basis for treatment or ?other patient management decisions. A negative result may occur with  ?improper specimen collection/handling, submission of specimen other ?than nasopharyngeal swab, presence of viral mutation(s) within the ?areas targeted by this assay, and inadequate number of viral ?copies(<138 copies/mL). A negative result must be combined with ?clinical observations, patient history, and epidemiological ?information. The expected result is Negative. ? ?Fact Sheet for Patients:  ?EntrepreneurPulse.com.au ? ?Fact Sheet for Healthcare Providers:  ?IncredibleEmployment.be ? ?This test is no t yet approved or cleared by the [place] FDA and  ?has been authorized for detection and/or diagnosis of SARS-CoV-2 by ?FDA under an Emergency Use Authorization (EUA). This EUA will remain  ?in effect (meaning this test  can be used) for the duration of the ?COVID-19 declaration under Section 564(b)(1) of the Act, 21 ?U.S.C.section 360bbb-3(b)(1), unless the authorization is terminated  ?or revoked sooner.  ? ? ?  ? Influenza A

## 2022-01-26 NOTE — Progress Notes (Signed)
PT Cancellation Note ? ?Patient Details ?Name: Deanna [name] ?MRN: [id number] ?DOB: 01-30-1951 ? ? ?Cancelled Treatment:    Reason Eval/Treat Not Completed: Other (comment) (nursing staff requesting to hold off on going in the room at this time due to agitation and patient cooperation. PT will continue with attempts as appropriate) ? ?Minna Merritts, PT, MPT ? ?Deanna [name] ?01/26/2022, 2:53 PM ?

## 2022-01-26 NOTE — TOC Progression Note (Signed)
Transition of Care (TOC) - Progression Note  ? ? ?Patient Details  ?Name: Domnique Giannini ?MRN: [id number] ?Date of Birth: 1950-10-10 ? ?Transition of Care (TOC) CM/SW Contact  ?Alberteen Sam, LCSW ?Phone Number: ?01/26/2022, 11:07 AM ? ?Clinical Narrative:    ? ?Zach with [employer] has all order needs for dme hoyer lift and hospital bed, he will start process and have [employer] rep reach out to patient's granddaughter [name] for delivery.  ? ?Per [employer], the electric wheel chair will need to be done outpatient, CSW has lvm with [name] with this update.  ? ? ? ?Expected Discharge Plan: [place] ?Barriers to Discharge: Continued Medical Work up ? ?Expected Discharge Plan and Services ?Expected Discharge Plan: [place] ?  ?  ?  ?Living arrangements for the past 2 months: [place] ?                ?  ?  ?  ?  ?  ?HH Arranged: PT, OT, RN ?[place] Agency: [facility] ?Date HH Agency Contacted: 01/23/22 ?Time [place]: [phone] ?Representative spoke with at [place]: [place] ? ? ?Social Determinants of Health (SDOH) Interventions ?  ? ?Readmission Risk Interventions ? ?  01/16/2022  ?  3:45 PM 12/29/2021  ?  2:33 PM  ?Readmission Risk Prevention Plan  ?Transportation Screening Complete Complete  ?PCP or Specialist Appt within 5-7 Days  Complete  ?Home Care Screening  Complete  ?Medication Review (RN CM)  Complete  ?Medication Review Press photographer) Complete   ?PCP or Specialist appointment within 3-5 days of discharge Complete   ?[place] or Home Care Consult Complete   ?SW Recovery Care/Counseling Consult Complete   ?Palliative Care Screening Not Applicable   ?[place] Not Applicable   ? ? ?

## 2022-01-26 NOTE — Progress Notes (Signed)
Outpatient hemodialysis plan: [facility] ([address] TTS 11:00am. Start date Saturday 5/6 at 10:15am. Spoke with [name] (patient's granddaughter) today. Concerns were discussed. [name] explained that she dropped off a package for patient at the front desk yesterday evening. This morning patient reported not receiving the package. Per [name], security is looking into the matter. Other concern discussed was the DME equipment and when it was going to be delivered. It was explained that [employer] was going to reach out soon to set up delivery (per [name]'s note). [name] stated she would be on the look out for that call. Other concern is medications, [name] would like to know what medication changes have occurred, she was advised to speak with nursing or physician, as this is out of my scope. Other concerns is blood sugar, per [name], she was contacted by diabetes educator and it was discussed to make a home visit for farther education. Lastly, discussed HPOA papers, clinic requested that [name] bring these papers in to make copies. [name] also stated that she would not be available to pick up patient until after 6pm. ?

## 2022-01-26 NOTE — Progress Notes (Signed)
?[facility]  ?PROGRESS NOTE  ? ?Subjective:  ? ?Patient seen and evaluated during dialysis ?  ?HEMODIALYSIS FLOWSHEET: ? ?Blood Flow Rate (mL/min): 400 mL/min ?Arterial Pressure (mmHg): -190 mmHg ?Venous Pressure (mmHg): 140 mmHg ?Transmembrane Pressure (mmHg): 70 mmHg ?Ultrafiltration Rate (mL/min): 500 mL/min ?Dialysate Flow Rate (mL/min): 500 ml/min ?Conductivity: Machine : 136 ?Conductivity: Machine : 136 ?Dialysis Fluid Bolus: Normal Saline ?Bolus Amount (mL): 250 mL ? ?Patient upset about a missing package that was left at security for her yesterday.  ? ?Objective:  ?Vital signs: ?Blood pressure (!) 156/70, pulse 80, temperature 99.1 ?F (37.3 ?C), temperature source Oral, resp. rate 19, height '6\' 1"'$  (1.854 m), weight 84.1 kg, SpO2 100 %. ? ?Intake/Output Summary (Last 24 hours) at 01/26/2022 1218 ?Last data filed at 01/26/2022 1200 ?Gross per 24 hour  ?Intake 3 ml  ?Output 1000 ml  ?Net -997 ml  ? ? ?Filed Weights  ? 01/26/22 0500 01/26/22 0845 01/26/22 1200  ?Weight: 82.2 kg 82.8 kg 84.1 kg  ? ? ? ?Physical Exam: ?General:  No acute distress  ?Head:  Normocephalic, atraumatic. Moist oral mucosal membranes  ?Eyes:  Anicteric  ?Lungs:  Clear bilaterally, normal effort  ?Heart:  S1S2 no rubs  ?Abdomen:   Soft, nontender, bowel sounds present  ?Extremities: trace peripheral edema.  ?Neurologic:  Awake, alert, following commands  ?Skin:  No lesions  ?Access: Right PermCath  ? ? ?Basic Metabolic Panel: ?Recent Labs  ?Lab 01/21/22 ?0234 01/21/22 ?5631 01/23/22 ?4970 01/25/22 ?0930 01/26/22 ?2637  ?NA 138 137 134* 136 136  ?K 3.6 3.7 4.0 4.6 4.9  ?CL 102 100 96* 99 101  ?CO2 '26 27 27 27 26  '$ ?GLUCOSE 49* 115* 182* 183* 155*  ?BUN 52* 52* 41* 30* 45*  ?CREATININE 3.56* 3.42* 3.16* 2.25* 2.88*  ?CALCIUM 8.4* 8.2* 7.9* 8.6* 8.6*  ?MG  --  1.8  --   --   --   ?PHOS 5.6*  --  5.3* 4.3  --   ? ? ? ?CBC: ?Recent Labs  ?Lab 01/20/22 ?1922 01/21/22 ?0234 01/21/22 ?8588 01/21/22 ?1445 01/23/22 ?1045 01/25/22 ?0930  01/26/22 ?5027  ?WBC 23.2*   < > 12.5* 9.1 6.6 7.1 6.5  ?NEUTROABS 19.2*  --   --   --   --   --   --   ?HGB 11.1*   < > 9.1* 8.3* 8.6* 8.6* 8.4*  ?HCT 37.8   < > 30.8* 27.1* 28.5* 27.6* 27.7*  ?MCV 92.0   < > 90.3 86.0 89.3 86.5 87.7  ?PLT 266   < > 228 228 302 289 289  ? < > = values in this interval not displayed.  ? ? ? ? ?Urinalysis: ?No results for input(s): COLORURINE, LABSPEC, [place], GLUCOSEU, HGBUR, BILIRUBINUR, KETONESUR, PROTEINUR, UROBILINOGEN, NITRITE, LEUKOCYTESUR in the last 72 hours. ? ?Invalid input(s): APPERANCEUR  ? ? ?Imaging: ?No results found. ? ? ?Medications:  ? ? ? heparin sodium (porcine)      ? aspirin EC  81 mg Oral Daily  ? atorvastatin  40 mg Oral Daily  ? Chlorhexidine Gluconate Cloth  6 each Topical Q0600  ? clindamycin  300 mg Oral Q8H  ? clopidogrel  75 mg Oral Daily  ? dextrose  1 ampule Intravenous Once  ? epoetin alfa      ? epoetin (EPOGEN/PROCRIT) injection  4,000 Units Intravenous Q T,Th,Sa-HD  ? ferrous sulfate  325 mg Oral q AM  ? gabapentin  200 mg Oral TID  ? heparin  injection (subcutaneous)  5,000 Units Subcutaneous Q8H  ? heparin sodium (porcine)      ? insulin aspart  0-6 Units Subcutaneous TID WC  ? metoprolol tartrate  25 mg Oral BID  ? pantoprazole  40 mg Oral Daily  ? sodium chloride flush  3 mL Intravenous Q12H  ? ? ?Assessment/ Plan:  ?   ?Principal Problem: ?  SOB (shortness of breath) ?Active Problems: ?  Essential (primary) hypertension ?  HFrEF (heart failure with reduced ejection fraction) ([place]) ?  Type 2 diabetes mellitus without complications ([place]) ?  Anemia ?  Elevated troponin ?  Severe sepsis ([place]) ?  Chest pain ?  Hypoglycemia associated with diabetes ([place]) ?  End-stage renal disease on hemodialysis ([place]) ?  Aspiration pneumonia ([place]) ?  Shortness of breath ? ?71 year old female with history of diabetes, hypertension, coronary artery disease, congestive heart failure, status post CVA, ESRD on hemodialysis now admitted with history of shortness of  breath.  She was found to have hypoglycemia and angioedema and is presently improving. ? ?[facility]/TTS/right PermCath/82 kg ?  ?#1: End stage renal disease on hemodialysis Will maintain outpatient schedule.  ? Receiving dialysis today with UF goal 1L as tolerated. Next treatment scheduled for Saturday.  ?  ?2: Anemia: Patient is anemia secondary to chronic kidney disease.:  Hgb remains 8.4.  Continue low dose EPO with treatments. ?  ?#3: Secondary hyperparathyroidism: Patient is elevated PTH levels.  ? Will continue to monitor bone minerals during this admission. ?  ?#4: Pneumonia/sepsis: Remains on Clindamycin  ?  ?#5: Hypoglycemia: Resolved with adequate treatment ?  ?#6: Hypertension:  Continue the metoprolol at the present doses ? Blood pressure 154/69 during dialysis ? ?#7: Diabetes mellitus type II with chronic kidney disease ?insulin dependent. Home regimen includes Lantus and Novolog. Most recent hemoglobin A1c is 5.5 on 12/27/21.  ? Glucose elevated at times ? ? LOS: 6 ?[place] ?[facility] ?5/4/202312:18 PM ?  ?

## 2022-01-27 DIAGNOSIS — J690 Pneumonitis due to inhalation of food and vomit: Secondary | ICD-10-CM | POA: Diagnosis not present

## 2022-01-27 DIAGNOSIS — E1169 Type 2 diabetes mellitus with other specified complication: Secondary | ICD-10-CM

## 2022-01-27 DIAGNOSIS — Z992 Dependence on renal dialysis: Secondary | ICD-10-CM | POA: Diagnosis not present

## 2022-01-27 DIAGNOSIS — N186 End stage renal disease: Secondary | ICD-10-CM | POA: Diagnosis not present

## 2022-01-27 LAB — CBC
HCT: 27.7 % — ABNORMAL LOW (ref 36.0–46.0)
Hemoglobin: 8.6 g/dL — ABNORMAL LOW (ref 12.0–15.0)
MCH: 27.3 pg (ref 26.0–34.0)
MCHC: 31 g/dL (ref 30.0–36.0)
MCV: 87.9 fL (ref 80.0–100.0)
Platelets: 281 10*3/uL (ref 150–400)
RBC: 3.15 MIL/uL — ABNORMAL LOW (ref 3.87–5.11)
RDW: 15.1 % (ref 11.5–15.5)
WBC: 6.7 10*3/uL (ref 4.0–10.5)
nRBC: 0 % (ref 0.0–0.2)

## 2022-01-27 LAB — BASIC METABOLIC PANEL
Anion gap: 10 (ref 5–15)
BUN: 36 mg/dL — ABNORMAL HIGH (ref 8–23)
CO2: 28 mmol/L (ref 22–32)
Calcium: 8.7 mg/dL — ABNORMAL LOW (ref 8.9–10.3)
Chloride: 98 mmol/L (ref 98–111)
Creatinine, Ser: 2.19 mg/dL — ABNORMAL HIGH (ref 0.44–1.00)
GFR, Estimated: 24 mL/min — ABNORMAL LOW (ref >60)
Glucose, Bld: 173 mg/dL — ABNORMAL HIGH (ref 70–99)
Potassium: 4.5 mmol/L (ref 3.5–5.1)
Sodium: 136 mmol/L (ref 135–145)

## 2022-01-27 LAB — GLUCOSE, CAPILLARY
Glucose-Capillary: 190 mg/dL — ABNORMAL HIGH (ref 70–99)
Glucose-Capillary: 121 mg/dL — ABNORMAL HIGH (ref 70–99)
Glucose-Capillary: 166 mg/dL — ABNORMAL HIGH (ref 70–99)

## 2022-01-27 MED ORDER — INSULIN ASPART 100 UNIT/ML IJ SOLN
5.0000 [IU] | Freq: Three times a day (TID) | INTRAMUSCULAR | 11 refills | Status: DC
Start: 2022-01-27 — End: 2023-05-20

## 2022-01-27 MED ORDER — CLOPIDOGREL BISULFATE 75 MG PO TABS: 75.0000 mg | ORAL_TABLET | Freq: Every day | ORAL | 0 refills | Status: AC

## 2022-01-27 NOTE — Progress Notes (Signed)
?[facility]  ?PROGRESS NOTE  ? ?Subjective:  ? ?Patient seen sitting up in bed, eating breakfast ?Alert and oriented ?Continues to complain of not receiving her package yesterday ?Repeatedly states she is ready for discharge ? ?Objective:  ?Vital signs: ?Blood pressure (!) 151/66, pulse 90, temperature 98 ?F (36.7 ?C), temperature source Oral, resp. rate 16, height '6\' 1"'$  (1.854 m), weight 84.8 kg, SpO2 95 %. ?No intake or output data in the 24 hours ending 01/27/22 1228 ? ?Filed Weights  ? 01/26/22 0845 01/26/22 1200 01/27/22 0500  ?Weight: 82.8 kg 84.1 kg 84.8 kg  ? ? ? ?Physical Exam: ?General:  No acute distress  ?Head:  Normocephalic, atraumatic. Moist oral mucosal membranes  ?Eyes:  Anicteric  ?Lungs:  Clear bilaterally, normal effort  ?Heart:  S1S2 no rubs  ?Abdomen:   Soft, nontender, bowel sounds present  ?Extremities: No peripheral edema.  ?Neurologic:  Awake, alert, following commands  ?Skin:  No lesions  ?Access: Right PermCath  ? ? ?Basic Metabolic Panel: ?Recent Labs  ?Lab 01/21/22 ?0234 01/21/22 ?3300 01/23/22 ?7622 01/25/22 ?0930 01/26/22 ?6333 01/27/22 ?0354  ?NA 138 137 134* 136 136 136  ?K 3.6 3.7 4.0 4.6 4.9 4.5  ?CL 102 100 96* 99 101 98  ?CO2 '26 27 27 27 26 28  '$ ?GLUCOSE 49* 115* 182* 183* 155* 173*  ?BUN 52* 52* 41* 30* 45* 36*  ?CREATININE 3.56* 3.42* 3.16* 2.25* 2.88* 2.19*  ?CALCIUM 8.4* 8.2* 7.9* 8.6* 8.6* 8.7*  ?MG  --  1.8  --   --   --   --   ?PHOS 5.6*  --  5.3* 4.3  --   --   ? ? ? ?CBC: ?Recent Labs  ?Lab 01/20/22 ?1922 01/21/22 ?0234 01/21/22 ?1445 01/23/22 ?1045 01/25/22 ?0930 01/26/22 ?5456 01/27/22 ?0354  ?WBC 23.2*   < > 9.1 6.6 7.1 6.5 6.7  ?NEUTROABS 19.2*  --   --   --   --   --   --   ?HGB 11.1*   < > 8.3* 8.6* 8.6* 8.4* 8.6*  ?HCT 37.8   < > 27.1* 28.5* 27.6* 27.7* 27.7*  ?MCV 92.0   < > 86.0 89.3 86.5 87.7 87.9  ?PLT 266   < > 228 302 289 289 281  ? < > = values in this interval not displayed.  ? ? ? ? ?Urinalysis: ?No results for input(s): COLORURINE, LABSPEC,  [place], GLUCOSEU, HGBUR, BILIRUBINUR, KETONESUR, PROTEINUR, UROBILINOGEN, NITRITE, LEUKOCYTESUR in the last 72 hours. ? ?Invalid input(s): APPERANCEUR  ? ? ?Imaging: ?No results found. ? ? ?Medications:  ? ? ? aspirin EC  81 mg Oral Daily  ? atorvastatin  40 mg Oral Daily  ? Chlorhexidine Gluconate Cloth  6 each Topical Q0600  ? clopidogrel  75 mg Oral Daily  ? dextrose  1 ampule Intravenous Once  ? epoetin (EPOGEN/PROCRIT) injection  4,000 Units Intravenous Q T,Th,Sa-HD  ? ferrous sulfate  325 mg Oral q AM  ? gabapentin  200 mg Oral TID  ? heparin injection (subcutaneous)  5,000 Units Subcutaneous Q8H  ? insulin aspart  0-6 Units Subcutaneous TID WC  ? metoprolol tartrate  25 mg Oral BID  ? pantoprazole  40 mg Oral Daily  ? sodium chloride flush  3 mL Intravenous Q12H  ? ? ?Assessment/ Plan:  ?   ?Principal Problem: ?  SOB (shortness of breath) ?Active Problems: ?  Essential (primary) hypertension ?  HFrEF (heart failure with reduced ejection fraction) ([place]) ?  Type 2 diabetes mellitus without complications ([place]) ?  Anemia ?  Elevated troponin ?  Severe sepsis ([place]) ?  Chest pain ?  Hypoglycemia associated with diabetes ([place]) ?  End-stage renal disease on hemodialysis ([place]) ?  Aspiration pneumonia ([place]) ?  Shortness of breath ? ?71 year old female with history of diabetes, hypertension, coronary artery disease, congestive heart failure, status post CVA, ESRD on hemodialysis now admitted with history of shortness of breath.  She was found to have hypoglycemia and angioedema and is presently improving. ? ?[facility]/TTS/right PermCath/82 kg ?  ?#1: End stage renal disease on hemodialysis Will maintain outpatient schedule.  ? Received dialysis yesterday, UF goal 1 L achieved.  Patient cleared to discharge from renal stance and continue outpatient dialysis treatments at [facility].  Next treatment scheduled for tomorrow. ?  ?2: Anemia: Patient is anemia secondary to chronic kidney disease.:  Hgb 8.6  today. Low dose EPO with dialysis treatments. ?  ?#3: Secondary hyperparathyroidism: Patient is elevated PTH levels.  ? Calcium remains within acceptable range.  We will continue to monitor bone minerals. ?  ?#4: Pneumonia/sepsis: Continue Clindamycin  ?  ?#5: Hypoglycemia: Resolved  ?  ?#6: Hypertension:  Continue the metoprolol at the present doses ? Blood pressure 151/66 ? ?#7: Diabetes mellitus type II with chronic kidney disease ?insulin dependent. Home regimen includes Lantus and Novolog. Most recent hemoglobin A1c is 5.5 on 12/27/21.  ? Glucose stable ? ? LOS: 7 ?[place] ?[facility] ?5/5/202312:28 PM ?  ?

## 2022-01-27 NOTE — Care Management Important Message (Signed)
Important Message ? ?Patient Details  ?Name: Deanna [name] ?MRN: [id number] ?Date of Birth: [birth year] ? ? ?Medicare Important Message Given:  Yes ? ? ? ? ?[name] ?01/27/2022, 11:03 AM ?

## 2022-01-27 NOTE — Discharge Summary (Signed)
Physician Discharge Summary  ?[name] AOZ:[id number] DOB: [birth year] DOA: 01/20/2022 ? ?[facility]P: [facility] ? ?Admit date: 01/20/2022 ?Discharge date: 01/27/2022 ? ?Admitted From: home ?Disposition:  home w/ home health  ? ?Recommendations for Outpatient Follow-up:  ?Follow up with PCP in 1-2 weeks ?F/u w/ nephro in 1-2 weeks  ? ?Home Health: yes ?Equipment/Devices: ? ?Discharge Condition: stable  ?CODE STATUS: full  ?Diet recommendation: Heart Healthy / Carb Modified / Renal  ? ?Brief/Interim Summary: ?HPI was taken from Dr. Florina Ou: ?Deanna [name] is a 71 y.o. female with medical history significant of heart disease, diabetes mellitus type 2, hypertension, history of stroke, end-stage renal disease on hemodialysis heart failure with reduced ejection fraction, angioedema coming to [place] with shortness of breath that started earlier today with generalized weakness and fever.  Patient on initial evaluation in the emergency room meet sepsis criteria with unclear source of infection.  Patient was given IV fluids and IV antibiotics. ?Admission was requested to medicine for sepsis and shortness of breath. ?Chart review showed that patient has been hospitalized throughout this month multiple times all throughout the hospital with an extended stay.  Patient initially was admitted earlier in the beginning of the month for numbness affecting the right arm where she was admitted for stroke evaluation.  Patient was critically ill with renal failure and hyperkalemia plan for immediate dialysis with placement of a temporary dialysis catheter by vascular MD.  On 415 patient had a rapid response where she was transferred to the ICU for respiratory distress.  Patient was seen by pulmonologist during that stay along with cardiologist.  Patient also had lobar pneumonia parainfluenza virus and treated with 5 days of antibiotic therapy per discharge summary on 01/13/2022.  On 4/20-second/2023 patient seen in  the emergency room with angioedema with mild swelling and ?Seen by ED doctor Dr. Archie Balboa and ICU service admitted patient as patient needed emergent nasotracheal intubation due to ACE inhibitor induced angioedema.  She was then discharged yesterday after her angioedema admission as her angioedema resolved and patient stabilized.  Patient returns again today with shortness of breath fevers and sepsis. ?Upon my initial evaluation patient was very lethargic at which time I discussed with ED provider and ICU provider Ms. pertinently they both evaluated patient at that time was diaphoretic and lethargic and found that patient needed to be intubated and further found that patient was hypoglycemic and mental status improved as did the oxygenation patient was able to protect her airway was responding after D50 was given.  And after patient is stabilized patient was requested to admit to medicine service. ?Also informed Dr. Zollie Scale about patient's admission and need for hemodialysis possibly tonight due to her respiratory distress which later stabilized on 4 L. ?We will admit and consult nephrology. ? ?As per Dr. Mal Misty: ?Deanna [name] is a 71 y.o. 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  She presented to the hospital again on 01/20/2022 for shortness of breath and generalized weakness.  She did not go to outpatient dialysis on the day that she presented to the ED.  Her oxygen saturation was 88% on room air. ? ? ?As per Dr. Jimmye Norman 5/3-01/27/22: Pt was able to weaned off of supplemental oxygen prior to d/c. Pt  completed her course of abxs while inpatient as well. PT/OT evaluated the pt and  recommended home health. For more information, please see previous progress/consults note.  ? ? ?Discharge Diagnoses:  ?Principal Problem: ?  SOB (shortness of breath) ?Active Problems: ?  HFrEF (heart failure with reduced ejection fraction) ([place]) ?  Severe sepsis ([place]) ?  Aspiration pneumonia ([place]) ?  Hypoglycemia associated with diabetes ([place]) ?  Type 2 diabetes mellitus without complications ([place]) ?  Elevated troponin ?  Chest pain ?  Essential (primary) hypertension ?  Anemia ?  End-stage renal disease on hemodialysis ([place]) ?  Shortness of breath ? ?Acute hypoxic respiratory failure: resolved ? ?Suspected severe sepsis: likely secondary to aspiration pneumonia. See Dr. Roxana Hires note on how the pt met severe sepsis criteria. Continue clindamycin. 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 ?  ?DM2: w/ hypoglycemic episodes that have now resolved. Intermittently has poor po intake. Continue on SSI w/ accuchecks  ?  ?ESRD: continue on HD as per nephro  ?  ?Secondary hyperparathyroidism: management as per nephro  ?  ?ACD: likely secondary to ESRD. H&H are labile  ?  ?Elevated troponin: likely secondary to demand ischemia  ?  ?Acute on chronic combined CHF: probably with acute exacerbation from missed outpatient dialysis.  Volume management w/ HD  ?  ?Debility: PT/OT recs HH  ?  ?CAD: continue on BB. Cannot tolerate ACE/ARB secondary to hx of angioedema  ?  ?HTN: continue on metoprolol  ?  ?HLD: continue on statin  ? ?Discharge Instructions ? ?Discharge Instructions   ? ? Diet - low sodium heart healthy   Complete by: As directed ?  ? Fluid restriction: 1200 mL Fluid every 24 hrs  ? Diet Carb Modified   Complete by: As directed ?  ? Fluid restriction: 1200 mL Fluid every 24 hrs  ? Discharge instructions   Complete by: As directed ?  ? F/u w/ PCP in 1 week. F/u w/ nephro in 1-2 weeks  ? Increase activity slowly   Complete by: As directed ?  ? ?  ? ?Allergies as of 01/27/2022   ? ?    Reactions  ? Ace Inhibitors Swelling  ? Entresto [sacubitril-valsartan] Swelling  ? Patient said she took both lisinopril and Entresto so it is not clear which one she actually reacted to.  For safety reasons, she has been advised to avoid both ACE inhibitors and Entresto.  ? Lisinopril Swelling  ? Severe Angioedema (requiring Nasotracheal intubation)  ? Tomato   ? Penicillins Rash  ? ?  ? ?  ?Medication List  ?  ? ?STOP taking these medications   ? ?Lantus SoloStar 100 UNIT/ML Solostar Pen ?Generic drug: insulin glargine ?  ? ?  ? ?TAKE these medications   ? ?aspirin EC 81 MG tablet ?Take 81 mg by mouth daily. ?  ?atorvastatin 40 MG tablet ?Commonly known as: LIPITOR ?Take 40 mg by mouth daily. ?  ?clopidogrel 75 MG tablet ?Commonly known as: PLAVIX ?Take 1 tablet (75 mg total) by mouth daily for 7 days. ?  ?ferrous sulfate 325 (65 FE) MG EC tablet ?Take 325 mg by mouth in the morning. ?  ?furosemide 20 MG tablet ?Commonly known as: LASIX ?Take 20 mg by mouth daily. ?  ?gabapentin 300 MG capsule ?Commonly known as: NEURONTIN ?Take 300 mg by mouth 3 (three) times daily. ?  ?insulin aspart 100 UNIT/ML injection ?Commonly known as: novoLOG ?Inject 5 Units into the skin 3 (three)  times daily before meals. 5 units Brk ?5 units noon ?5 units hs ?What changed:  ?how much to take ?additional instructions ?  ?metoprolol tartrate 25 MG tablet ?Commonly known as: LOPRESSOR ?Take 1 tablet (25 mg total) by mouth 2 (two) times daily. ?  ?Vitamin D (Ergocalciferol) 1.25 MG (50000 UNIT) Caps capsule ?Commonly known as: DRISDOL ?Take 50,000 Units by mouth once a week. ?  ? ?  ? ?  ?  ? ? ?  ?Durable Medical Equipment  ?(From admission, onward)  ?  ? ? ?  ? ?  Start     Ordered  ? 01/25/22 1425  For home use only DME Wheelchair electric  Once       ?Comments: Hx of CVA, ESRD on HD, generalized weakness  ? 01/25/22 1424  ? 01/25/22 1424  For home use only DME Hospital bed  Once       ?Question Answer Comment  ?Length of Need 12  Months   ?Patient has (list medical condition): CVA, ESRD on HD   ?Bed type Semi-electric   ?Hoyer Lift Yes   ?  ? 01/25/22 1423  ? ?  ?  ? ?  ? ? ?Allergies  ?Allergen Reactions  ? Ace Inhibitors Swelling  ? Lyn Hollingshead

## 2022-01-27 NOTE — TOC Transition Note (Signed)
Transition of Care (TOC) - CM/SW Discharge Note ? ? ?Patient Details  ?Name: Deanna [name] ?MRN: [id number] ?Date of Birth: 1950-10-24 ? ?Transition of Care (TOC) CM/SW Contact:  ?Alberteen Sam, LCSW ?Phone Number: ?01/27/2022, 9:20 AM ? ? ?Clinical Narrative:    ? ?Patient to dc home today with [facility] PT OT RN with [facility].  ? ?Patient's hospital bed and hoyer to be delivered Monday via [employer] per granddaughter request as they are moving over the weekend.  ? ?Patient's granddaughter [name] will pick patient up today between 2pm-5pm , treatment team updated.  ? ?No further dc needs identified  ? ?Final next level of care: [place] ?Barriers to Discharge: No Barriers Identified ? ? ?Patient Goals and CMS Choice ?Patient states their goals for this hospitalization and ongoing recovery are:: to go home ?CMS Medicare.gov Compare Post Acute Care list provided to:: Patient ?Choice offered to / list presented to : Patient ? ?Discharge Placement ?  ?           ?  ?Patient to be transferred to facility by: granddaughter ?  ?Patient and family notified of of transfer: 01/27/22 ? ?Discharge Plan and Services ?  ?  ?           ?DME Arranged: Hospital bed ?DME Agency: [employer] ?  ?  ?  ?HH Arranged: PT, OT, RN ?[place] Agency: [facility] ?Date HH Agency Contacted: 01/27/22 ?Time [place]: 2355 ?Representative spoke with at [place]: [place] ? ?Social Determinants of Health (SDOH) Interventions ?  ? ? ?Readmission Risk Interventions ? ?  01/16/2022  ?  3:45 PM 12/29/2021  ?  2:33 PM  ?Readmission Risk Prevention Plan  ?Transportation Screening Complete Complete  ?PCP or Specialist Appt within 5-7 Days  Complete  ?Home Care Screening  Complete  ?Medication Review (RN CM)  Complete  ?Medication Review Press photographer) Complete   ?PCP or Specialist appointment within 3-5 days of discharge Complete   ?[place] or Home Care Consult Complete   ?SW Recovery Care/Counseling Consult Complete    ?Palliative Care Screening Not Applicable   ?[place] Not Applicable   ? ? ? ? ? ?

## 2022-01-27 NOTE — Progress Notes (Signed)
Pt discharged per MD order. IV removed. Discharge instructions reviewed with pt. Pt verbalized understanding. All questions answered to pt satisfaction. Pt taken to car in wheelchair by staff.  ?

## 2022-01-27 NOTE — Progress Notes (Signed)
PT Cancellation Note ? ?Patient Details ?Name: Deanna [name] ?MRN: [id number] ?DOB: September 06, 1951 ? ? ?Cancelled Treatment:     PT attempt. Pt with active DC orders. She is currently sleeping soundly and author elected not to awake. Cleared form an acute PT standpoint for safe DC at this time. She will DC to home with granddaughter.  ? ? ?Willette Pa ?01/27/2022, 2:40 PM ?

## 2022-04-07 ENCOUNTER — Telehealth (INDEPENDENT_AMBULATORY_CARE_PROVIDER_SITE_OTHER): Payer: Self-pay

## 2022-04-07 NOTE — Telephone Encounter (Addendum)
I attempted to contact the patient to schedule a permcath exchange and a message was left for a return call. I contacted [facility] and per Otila Kluver was asked  to make the appt and fax to her attention because the patient is being seen on 04/08/22. Pre-procedure instructions will be faxed.

## 2022-04-10 DIAGNOSIS — N186 End stage renal disease: Secondary | ICD-10-CM

## 2022-04-10 MED ORDER — FAMOTIDINE 20 MG PO TABS: 40.0000 mg | ORAL_TABLET | Freq: Once | ORAL | Status: DC | PRN

## 2022-04-10 MED ORDER — METHYLPREDNISOLONE SODIUM SUCC 125 MG IJ SOLR: 125.0000 mg | Freq: Once | INTRAMUSCULAR | Status: DC | PRN

## 2022-04-10 MED ORDER — MIDAZOLAM HCL 2 MG/ML PO SYRP: 8.0000 mg | ORAL_SOLUTION | Freq: Once | ORAL | Status: DC | PRN

## 2022-04-10 MED ORDER — VANCOMYCIN HCL IN DEXTROSE 1-5 GM/200ML-% IV SOLN
1000.0000 mg | INTRAVENOUS | Status: DC
  Filled 2022-04-10 (×2): qty 200

## 2022-04-10 MED ORDER — SODIUM CHLORIDE 0.9 % IV SOLN: INTRAVENOUS | Status: DC

## 2022-04-10 NOTE — Telephone Encounter (Signed)
[name] called stating that she just found out about the procedure scheduled for today. States that pt has taken her medicine and she will have to cancel the procedure for today. Would like to R/S for sometime this week. Please call [name] (granddaughter + caretaker) at [phone].

## 2022-04-11 ENCOUNTER — Other Ambulatory Visit (INDEPENDENT_AMBULATORY_CARE_PROVIDER_SITE_OTHER): Payer: Self-pay | Admitting: Nurse Practitioner

## 2022-04-12 ENCOUNTER — Encounter
Admission: RE | Disposition: A | Discharge status: Home / Self Care | Payer: Self-pay | Source: Ambulatory Visit | Attending: Vascular Surgery

## 2022-04-12 ENCOUNTER — Other Ambulatory Visit: Payer: Self-pay

## 2022-04-12 ENCOUNTER — Encounter: Payer: Self-pay | Admitting: Vascular Surgery

## 2022-04-12 ENCOUNTER — Ambulatory Visit
Admission: RE | Admit: 2022-04-12 | Discharge: 2022-04-12 | Disposition: A | Discharge status: Home / Self Care | Payer: Medicare HMO | Source: Ambulatory Visit | Attending: Vascular Surgery | Admitting: Vascular Surgery

## 2022-04-12 DIAGNOSIS — Z992 Dependence on renal dialysis: Secondary | ICD-10-CM | POA: Diagnosis not present

## 2022-04-12 DIAGNOSIS — N186 End stage renal disease: Secondary | ICD-10-CM

## 2022-04-12 DIAGNOSIS — T8249XA Other complication of vascular dialysis catheter, initial encounter: Secondary | ICD-10-CM

## 2022-04-12 DIAGNOSIS — R5383 Other fatigue: Secondary | ICD-10-CM

## 2022-04-12 DIAGNOSIS — Z4901 Encounter for fitting and adjustment of extracorporeal dialysis catheter: Secondary | ICD-10-CM | POA: Diagnosis not present

## 2022-04-12 DIAGNOSIS — N189 Chronic kidney disease, unspecified: Secondary | ICD-10-CM

## 2022-04-12 DIAGNOSIS — R0602 Shortness of breath: Secondary | ICD-10-CM | POA: Diagnosis not present

## 2022-04-12 DIAGNOSIS — I120 Hypertensive chronic kidney disease with stage 5 chronic kidney disease or end stage renal disease: Secondary | ICD-10-CM | POA: Insufficient documentation

## 2022-04-12 DIAGNOSIS — I2510 Atherosclerotic heart disease of native coronary artery without angina pectoris: Secondary | ICD-10-CM | POA: Insufficient documentation

## 2022-04-12 DIAGNOSIS — N179 Acute kidney failure, unspecified: Secondary | ICD-10-CM

## 2022-04-12 DIAGNOSIS — E1122 Type 2 diabetes mellitus with diabetic chronic kidney disease: Secondary | ICD-10-CM | POA: Insufficient documentation

## 2022-04-12 HISTORY — PX: DIALYSIS/PERMA CATHETER INSERTION: CATH118288

## 2022-04-12 LAB — GLUCOSE, CAPILLARY: Glucose-Capillary: 96 mg/dL (ref 70–99)

## 2022-04-12 LAB — POTASSIUM (ARMC VASCULAR LAB ONLY): Potassium (ARMC vascular lab): 4 mmol/L (ref 3.5–5.1)

## 2022-04-12 SURGERY — DIALYSIS/PERMA CATHETER INSERTION
Anesthesia: Moderate Sedation

## 2022-04-12 MED ORDER — FENTANYL CITRATE (PF) 100 MCG/2ML IJ SOLN: 12.5000 ug | Freq: Once | INTRAMUSCULAR | Status: DC | PRN

## 2022-04-12 MED ORDER — DIPHENHYDRAMINE HCL 50 MG/ML IJ SOLN: 50.0000 mg | Freq: Once | INTRAMUSCULAR | Status: DC | PRN

## 2022-04-12 MED ORDER — FENTANYL CITRATE (PF) 100 MCG/2ML IJ SOLN
INTRAMUSCULAR | Status: DC | PRN
Start: 2022-04-12 — End: 2022-04-12
  Administered 2022-04-12: 50 ug via INTRAVENOUS
  Administered 2022-04-12: 25 ug via INTRAVENOUS

## 2022-04-12 MED ORDER — MIDAZOLAM HCL 2 MG/ML PO SYRP: 8.0000 mg | ORAL_SOLUTION | Freq: Once | ORAL | Status: DC | PRN

## 2022-04-12 MED ORDER — HYDROMORPHONE HCL 1 MG/ML IJ SOLN: 1.0000 mg | Freq: Once | INTRAMUSCULAR | Status: DC | PRN

## 2022-04-12 MED ORDER — SODIUM CHLORIDE 0.9 % IV SOLN: INTRAVENOUS | Status: DC

## 2022-04-12 MED ORDER — VANCOMYCIN HCL IN DEXTROSE 1-5 GM/200ML-% IV SOLN
1000.0000 mg | INTRAVENOUS | Status: AC
  Administered 2022-04-12: 1000 mg via INTRAVENOUS

## 2022-04-12 MED ORDER — MIDAZOLAM HCL 2 MG/2ML IJ SOLN
INTRAMUSCULAR | Status: AC
  Filled 2022-04-12: qty 2

## 2022-04-12 MED ORDER — FENTANYL CITRATE (PF) 100 MCG/2ML IJ SOLN
INTRAMUSCULAR | Status: AC
  Filled 2022-04-12: qty 2

## 2022-04-12 MED ORDER — MIDAZOLAM HCL 2 MG/2ML IJ SOLN
INTRAMUSCULAR | Status: DC | PRN
  Administered 2022-04-12: 1 mg via INTRAVENOUS
  Administered 2022-04-12: .5 mg via INTRAVENOUS

## 2022-04-12 MED ORDER — METHYLPREDNISOLONE SODIUM SUCC 125 MG IJ SOLR: 125.0000 mg | Freq: Once | INTRAMUSCULAR | Status: DC | PRN

## 2022-04-12 MED ORDER — VANCOMYCIN HCL IN DEXTROSE 1-5 GM/200ML-% IV SOLN: 1000.0000 mg | INTRAVENOUS | Status: DC

## 2022-04-12 MED ORDER — ONDANSETRON HCL 4 MG/2ML IJ SOLN: 4.0000 mg | Freq: Four times a day (QID) | INTRAMUSCULAR | Status: DC | PRN

## 2022-04-12 SURGICAL SUPPLY — 10 items
BIOPATCH RED 1 DISK 7.0 (GAUZE/BANDAGES/DRESSINGS) ×1 IMPLANT
CATH PALIN MAXID VT KIT 19CM (CATHETERS) ×1 IMPLANT
CHLORAPREP W/TINT 26 (MISCELLANEOUS) ×1 IMPLANT
DERMABOND ADVANCED (GAUZE/BANDAGES/DRESSINGS) ×1
DERMABOND ADVANCED .7 DNX12 (GAUZE/BANDAGES/DRESSINGS) IMPLANT
GUIDEWIRE SUPER STIFF .035X180 (WIRE) ×1 IMPLANT
PACK ANGIOGRAPHY (CUSTOM PROCEDURE TRAY) ×1 IMPLANT
SUT MNCRL AB 4-0 PS2 18 (SUTURE) ×1 IMPLANT
SUT PROLENE 0 CT 1 30 (SUTURE) ×1 IMPLANT
TOWEL OR 17X26 4PK STRL BLUE (TOWEL DISPOSABLE) ×1 IMPLANT

## 2022-04-12 NOTE — H&P (Signed)
[facility] Admission History & Physical  MRN : [id number]  Ursala Cressy is a 71 y.o. (09/15/1951) female who presents with chief complaint of No chief complaint on file. Marland Kitchen  History of Present Illness:  I am asked to evaluate the patient by the dialysis center. The patient was sent here because they need a functional Permcath. ESRD and needs access.  Low energy and shortness of breath present.   Current Facility-Administered Medications  Medication Dose Route Frequency Provider Last Rate Last Admin   fentaNYL (SUBLIMAZE) 100 MCG/2ML injection            midazolam (VERSED) 2 MG/2ML injection            0.9 %  sodium chloride infusion   Intravenous Continuous Kris Hartmann, NP       0.9 %  sodium chloride infusion   Intravenous Continuous Kris Hartmann, NP       diphenhydrAMINE (BENADRYL) injection 50 mg  50 mg Intravenous Once PRN Kris Hartmann, NP       famotidine (PEPCID) tablet 40 mg  40 mg Oral Once PRN Kris Hartmann, NP       fentaNYL (SUBLIMAZE) injection 12.5 mcg  12.5 mcg Intravenous Once PRN Kris Hartmann, NP       HYDROmorphone (DILAUDID) injection 1 mg  1 mg Intravenous Once PRN Kris Hartmann, NP       methylPREDNISolone sodium succinate (SOLU-MEDROL) 125 mg/2 mL injection 125 mg  125 mg Intravenous Once PRN Kris Hartmann, NP       midazolam (VERSED) 2 MG/ML syrup 8 mg  8 mg Oral Once PRN Kris Hartmann, NP       ondansetron [facility]) injection 4 mg  4 mg Intravenous Q6H PRN Kris Hartmann, NP       vancomycin (VANCOCIN) IVPB 1000 mg/200 mL premix  1,000 mg Intravenous 60 min Pre-Op Algernon Huxley, MD         Past Surgical History:  Procedure Laterality Date   CARDIAC CATHETERIZATION Left 03/01/2016   Procedure: Left Heart Cath and Coronary Angiography;  Surgeon: Teodoro Spray, MD;  Location: [facility];  Service: Cardiovascular;  Laterality: Left;   DIALYSIS/PERMA CATHETER INSERTION Right 01/10/2022   Procedure:  DIALYSIS/PERMA CATHETER INSERTION;  Surgeon: Evaristo Bury, MD;  Location: [facility];  Service: Cardiovascular;  Laterality: Right;   INTUBATION-ENDOTRACHEAL WITH TRACHEOSTOMY STANDBY Left 01/14/2022   Procedure: INTUBATION-NASAL WITH TRACHEOSTOMY STANDBY;  Surgeon: Beverly Gust, MD;  Location: [facility];  Service: ENT;  Laterality: Left;   TEMPORARY DIALYSIS CATHETER N/A 01/04/2022   Procedure: TEMPORARY DIALYSIS CATHETER;  Surgeon: Algernon Huxley, MD;  Location: [facility];  Service: Cardiovascular;  Laterality: N/A;     Social History   Tobacco Use   Smoking status: Never  Substance Use Topics   Alcohol use: No   Drug use: No    Family History History reviewed. No pertinent family history.  No family history of bleeding or clotting disorders, autoimmune disease or porphyria  Allergies  Allergen Reactions   Ace Inhibitors Swelling   Entresto [Sacubitril-Valsartan] Swelling    Patient said she took both lisinopril and Entresto so it is not clear which one she actually reacted to.  For safety reasons, she has been advised to avoid both ACE inhibitors and Entresto.   Lisinopril Swelling    Severe Angioedema (requiring Nasotracheal intubation)   Tomato    Penicillins  Rash     REVIEW OF SYSTEMS (Negative unless checked)  Constitutional: '[]'$ Weight loss  '[]'$ Fever  '[]'$ Chills Cardiac: '[]'$ Chest pain   '[]'$ Chest pressure   '[]'$ Palpitations   '[]'$ Shortness of breath when laying flat   '[x]'$ Shortness of breath at rest   '[x]'$ Shortness of breath with exertion. Vascular:  '[]'$ Pain in legs with walking   '[]'$ Pain in legs at rest   '[]'$ Pain in legs when laying flat   '[]'$ Claudication   '[]'$ Pain in feet when walking  '[]'$ Pain in feet at rest  '[]'$ Pain in feet when laying flat   '[]'$ History of DVT   '[]'$ Phlebitis   '[]'$ Swelling in legs   '[]'$ Varicose veins   '[]'$ Non-healing ulcers Pulmonary:   '[]'$ Uses home oxygen   '[]'$ Productive cough   '[]'$ Hemoptysis   '[]'$ Wheeze  '[]'$ COPD   '[]'$ Asthma Neurologic:  '[]'$ Dizziness   '[]'$ Blackouts   '[]'$ Seizures   '[]'$ History of stroke   '[]'$ History of TIA  '[]'$ Aphasia   '[]'$ Temporary blindness   '[]'$ Dysphagia   '[]'$ Weakness or numbness in arms   '[]'$ Weakness or numbness in legs Musculoskeletal:  '[]'$ Arthritis   '[]'$ Joint swelling   '[]'$ Joint pain   '[]'$ Low back pain Hematologic:  '[]'$ Easy bruising  '[]'$ Easy bleeding   '[]'$ Hypercoagulable state   '[x]'$ Anemic  '[]'$ Hepatitis Gastrointestinal:  '[]'$ Blood in stool   '[]'$ Vomiting blood  '[]'$ Gastroesophageal reflux/heartburn   '[]'$ Difficulty swallowing. Genitourinary:  '[x]'$ Chronic kidney disease   '[]'$ Difficult urination  '[]'$ Frequent urination  '[]'$ Burning with urination   '[]'$ Blood in urine Skin:  '[]'$ Rashes   '[]'$ Ulcers   '[]'$ Wounds Psychological:  '[]'$ History of anxiety   '[]'$  History of major depression.  Physical Examination  Vitals:   04/12/22 0948  BP: (!) 176/95  Pulse: 83  Resp: 19  Temp: 98.4 F (36.9 C)  TempSrc: Oral  SpO2: 99%  Weight: 86 kg  Height: 6' 1.5" (1.867 m)   Body mass index is 24.67 kg/m. Gen: WD/WN, NAD Head: [place]/AT, No temporalis wasting.  Ear/Nose/Throat: Hearing grossly intact, nares w/o erythema or drainage, oropharynx w/o Erythema/Exudate,  Eyes: Conjunctiva clear, sclera non-icteric Neck: Trachea midline.  No JVD.  Pulmonary:  Good air movement, respirations not labored, no use of accessory muscles.  Cardiac: RRR, normal S1, S2. Vascular:  Vessel Right Left  Radial Palpable Palpable  Gastrointestinal: soft, non-tender/non-distended. No guarding/reflex.  Musculoskeletal: M/S 5/5 throughout.  Extremities without ischemic changes.  No deformity or atrophy.  Neurologic: Sensation grossly intact in extremities.  Symmetrical.  Speech is fluent. Motor exam as listed above. Psychiatric: Judgment intact, Mood & affect appropriate for pt's clinical situation. Dermatologic: No rashes or ulcers noted.  No cellulitis or open wounds.    CBC Lab Results  Component Value Date   WBC 6.7 01/27/2022   HGB 8.6 (L) 01/27/2022   HCT 27.7 (L) 01/27/2022    MCV 87.9 01/27/2022   PLT 281 01/27/2022    BMET    Component Value Date/Time   NA 136 01/27/2022 0354   K 4.5 01/27/2022 0354   CL 98 01/27/2022 0354   CO2 28 01/27/2022 0354   GLUCOSE 173 (H) 01/27/2022 0354   BUN 36 (H) 01/27/2022 0354   CREATININE 2.19 (H) 01/27/2022 0354   CALCIUM 8.7 (L) 01/27/2022 0354   GFRNONAA 24 (L) 01/27/2022 0354   GFRAA 36 (L) 12/09/2017 1108   CrCl cannot be calculated (Patient's most recent lab result is older than the maximum 21 days allowed.).  COAG Lab Results  Component Value Date   INR 1.1 01/20/2022   INR 1.0 01/07/2022   INR 1.0 12/26/2021  Radiology No results found.  Assessment/Plan 1.  End-stage renal disease requiring hemodialysis:  Patient needs new permcath for dialysis. 3.  Hypertension:  Patient will continue medical management; nephrology is following no changes in oral medications. 4. Diabetes mellitus:  Glucose will be monitored and oral medications been held this morning once the patient has undergone the patient's procedure po intake will be reinitiated and again Accu-Cheks will be used to assess the blood glucose level and treat as needed. The patient will be restarted on the patient's usual hypoglycemic regime 5.  Coronary artery disease:  EKG will be monitored. Nitrates will be used if needed. The patient's oral cardiac medications will be continued.    Leotis Pain, MD  04/12/2022 10:30 AM

## 2022-04-12 NOTE — Op Note (Signed)
OPERATIVE NOTE    PRE-OPERATIVE DIAGNOSIS: 1. ESRD 2. Non-functional permcath  POST-OPERATIVE DIAGNOSIS: same as above  PROCEDURE: Fluoroscopic guidance for placement of catheter Placement of a 19 cm tip to cuff tunneled hemodialysis catheter via the right internal jugular vein and removal of previous catheter  SURGEON: Leotis Pain, MD  ANESTHESIA:  Local with moderate conscious sedation for 17 minutes using 1.5 mg of Versed and 75 mcg of Fentanyl  ESTIMATED BLOOD LOSS: 2 cc  FINDING(S): none  SPECIMEN(S):  None  INDICATIONS:   Patient is a 71 y.o.female who presents with non-functional dialysis catheter and ESRD.  The patient needs long term dialysis access for their ESRD, and a Permcath is necessary.  Risks and benefits are discussed and informed consent is obtained.    DESCRIPTION: After obtaining full informed written consent, the patient was brought back to the vascular suite. The patient received moderate conscious sedation during a face-to-face encounter with me present throughout the entire procedure and supervising the RN monitoring the vital signs, pulse oximetry, telemetry, and mental status throughout the entire procedure. The patient's existing catheter, right neck and chest were sterilely prepped and draped in a sterile surgical field was created.  The existing catheter was dissected free from the fibrous sheath securing the cuff with hemostats and blunt dissection.  A wire was placed. The existing catheter was then removed and the wire used to keep venous access. I selected a 19 cm tip to cuff tunneled dialysis catheter.  Using fluoroscopic guidance the catheter tips were parked in the right atrium. The appropriate distal connectors were placed. It withdrew blood well and flushed easily with heparinized saline and a concentrated heparin solution was then placed. It was secured to the chest wall with 2 Prolene sutures. A 4-0 Monocryl pursestring suture was placed around the  exit site. Sterile dressings were placed. The patient tolerated the procedure well and was taken to the recovery room in stable condition.  COMPLICATIONS: None  CONDITION: Stable  Leotis Pain 04/12/2022 11:20 AM   This note was created with Dragon Medical transcription system. Any errors in dictation are purely unintentional.

## 2022-04-13 ENCOUNTER — Telehealth (INDEPENDENT_AMBULATORY_CARE_PROVIDER_SITE_OTHER): Payer: Self-pay

## 2022-04-13 NOTE — Telephone Encounter (Signed)
I attempted to contact the patient through her granddaughters phone number and again the voice mail box was full and I was unable to leave a message for a callback. I attempted to contact through the mobile number and a message was left for a return call. This happened last week when I attempted to contact the patient, that is why I reached out to the dialysis center and was told to make the appt and fax it to them and they would give to the patient.

## 2022-04-21 ENCOUNTER — Telehealth (INDEPENDENT_AMBULATORY_CARE_PROVIDER_SITE_OTHER): Payer: Self-pay

## 2022-04-21 NOTE — Telephone Encounter (Signed)
I again attempted to contact the patient's granddaughter to get the patient scheduled for a permcath exchange. I was unable to leave a message as the voicemail box is full.

## 2022-06-22 ENCOUNTER — Other Ambulatory Visit: Payer: Self-pay | Admitting: Physician Assistant

## 2022-06-22 DIAGNOSIS — Z1231 Encounter for screening mammogram for malignant neoplasm of breast: Secondary | ICD-10-CM

## 2022-11-30 ENCOUNTER — Other Ambulatory Visit: Payer: Self-pay | Admitting: Family Medicine

## 2022-11-30 DIAGNOSIS — Z1231 Encounter for screening mammogram for malignant neoplasm of breast: Secondary | ICD-10-CM

## 2022-12-26 ENCOUNTER — Ambulatory Visit
Admission: RE | Admit: 2022-12-26 | Discharge: 2022-12-26 | Disposition: A | Discharge status: Home / Self Care | Payer: Medicare HMO | Source: Ambulatory Visit | Attending: Family Medicine | Admitting: Family Medicine

## 2022-12-26 DIAGNOSIS — Z1231 Encounter for screening mammogram for malignant neoplasm of breast: Secondary | ICD-10-CM

## 2022-12-27 ENCOUNTER — Inpatient Hospital Stay
Admission: RE | Admit: 2022-12-27 | Discharge: 2022-12-27 | Disposition: A | Discharge status: Home / Self Care | Payer: Self-pay | Source: Ambulatory Visit | Attending: Family Medicine | Admitting: Family Medicine

## 2022-12-27 ENCOUNTER — Other Ambulatory Visit: Payer: Self-pay | Admitting: *Deleted

## 2022-12-27 DIAGNOSIS — Z1231 Encounter for screening mammogram for malignant neoplasm of breast: Secondary | ICD-10-CM

## 2022-12-31 IMAGING — CR DG CHEST 2V
1 series · 2 of 2 positions shown · non-contrast
Comparison: 12/09/2017

CLINICAL DATA: Dyspnea

EXAM:
CHEST - 2 VIEW

[Series 1: dg chest 2 view · 0.14mm/px · 2 of 2 slices shown]
[im 1/2]
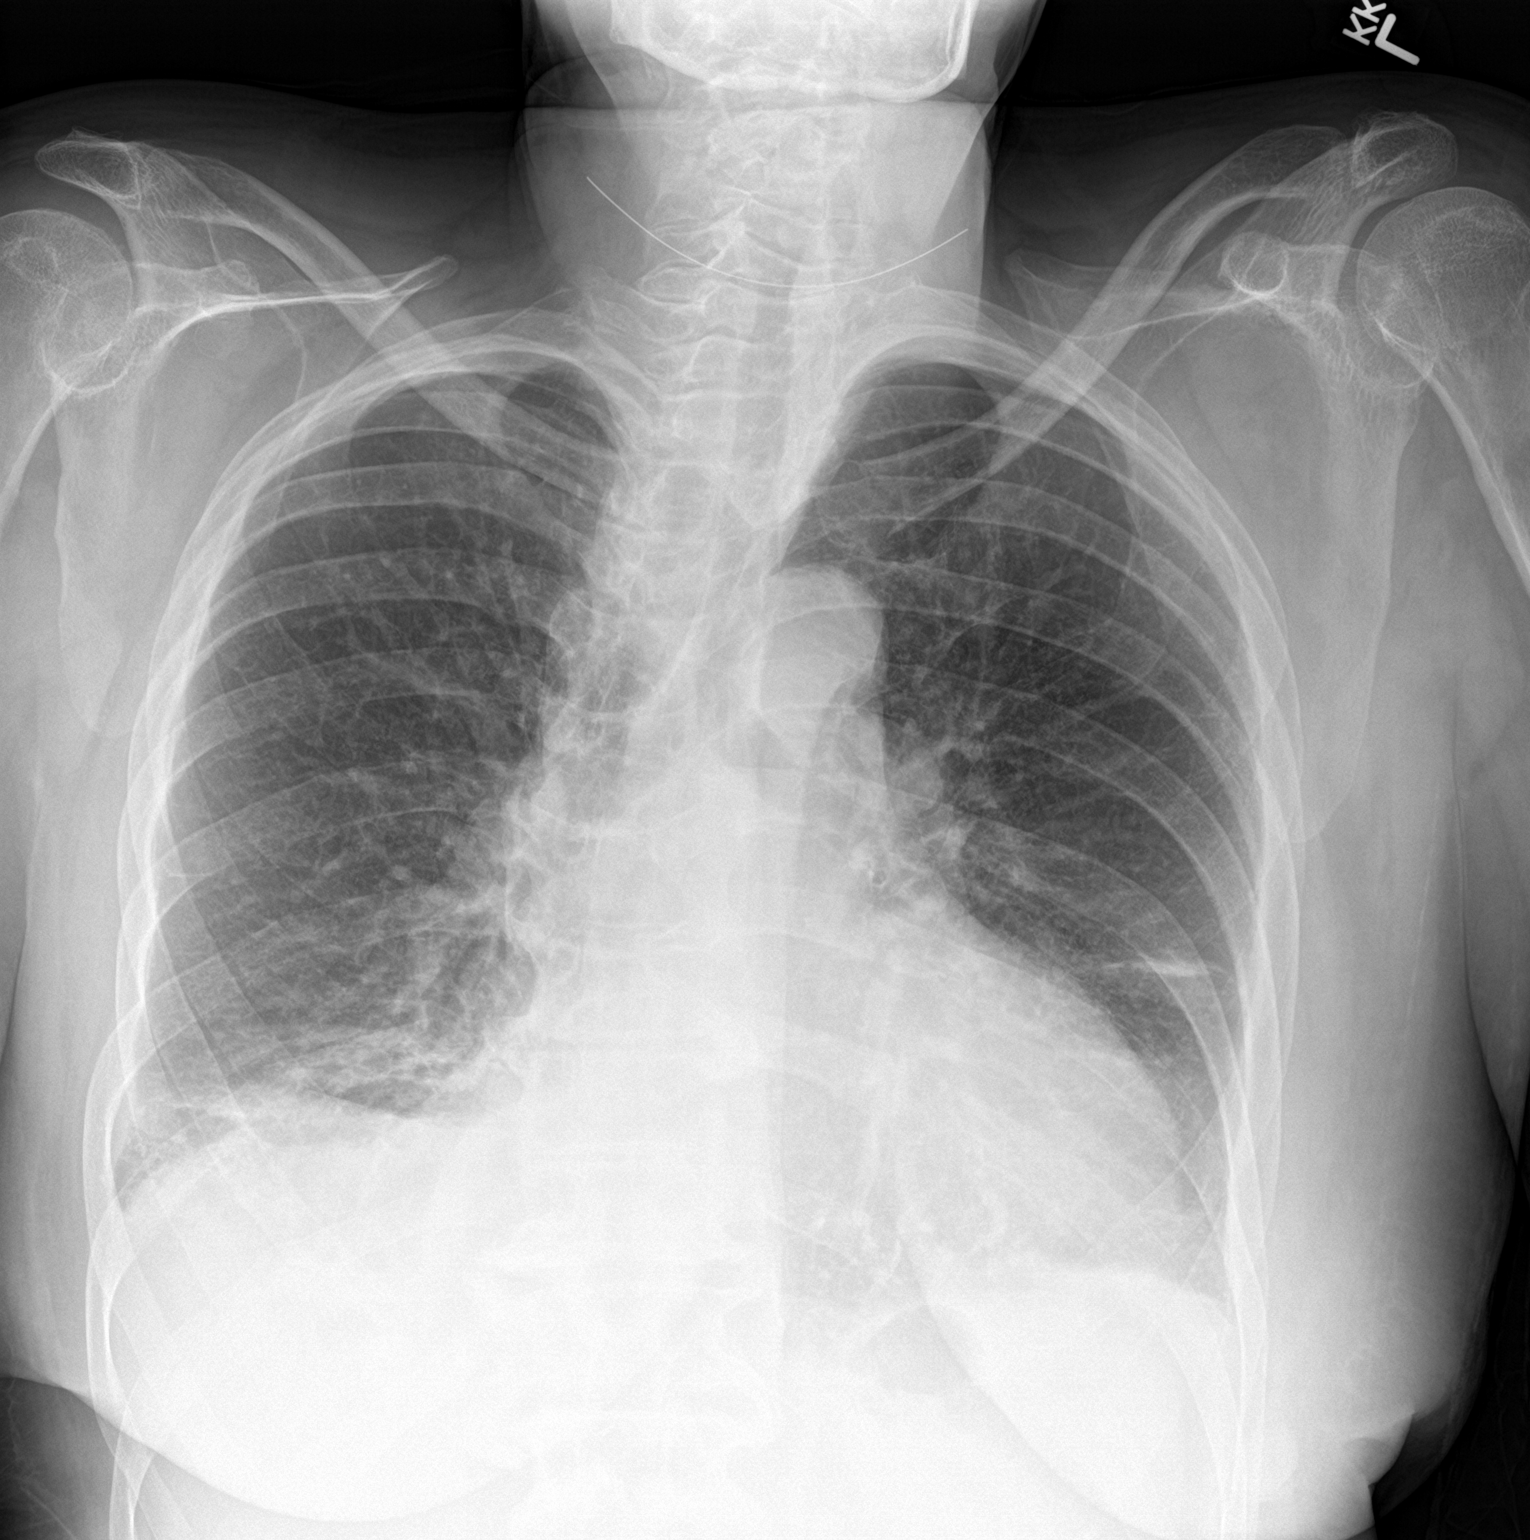
[im 2/2]
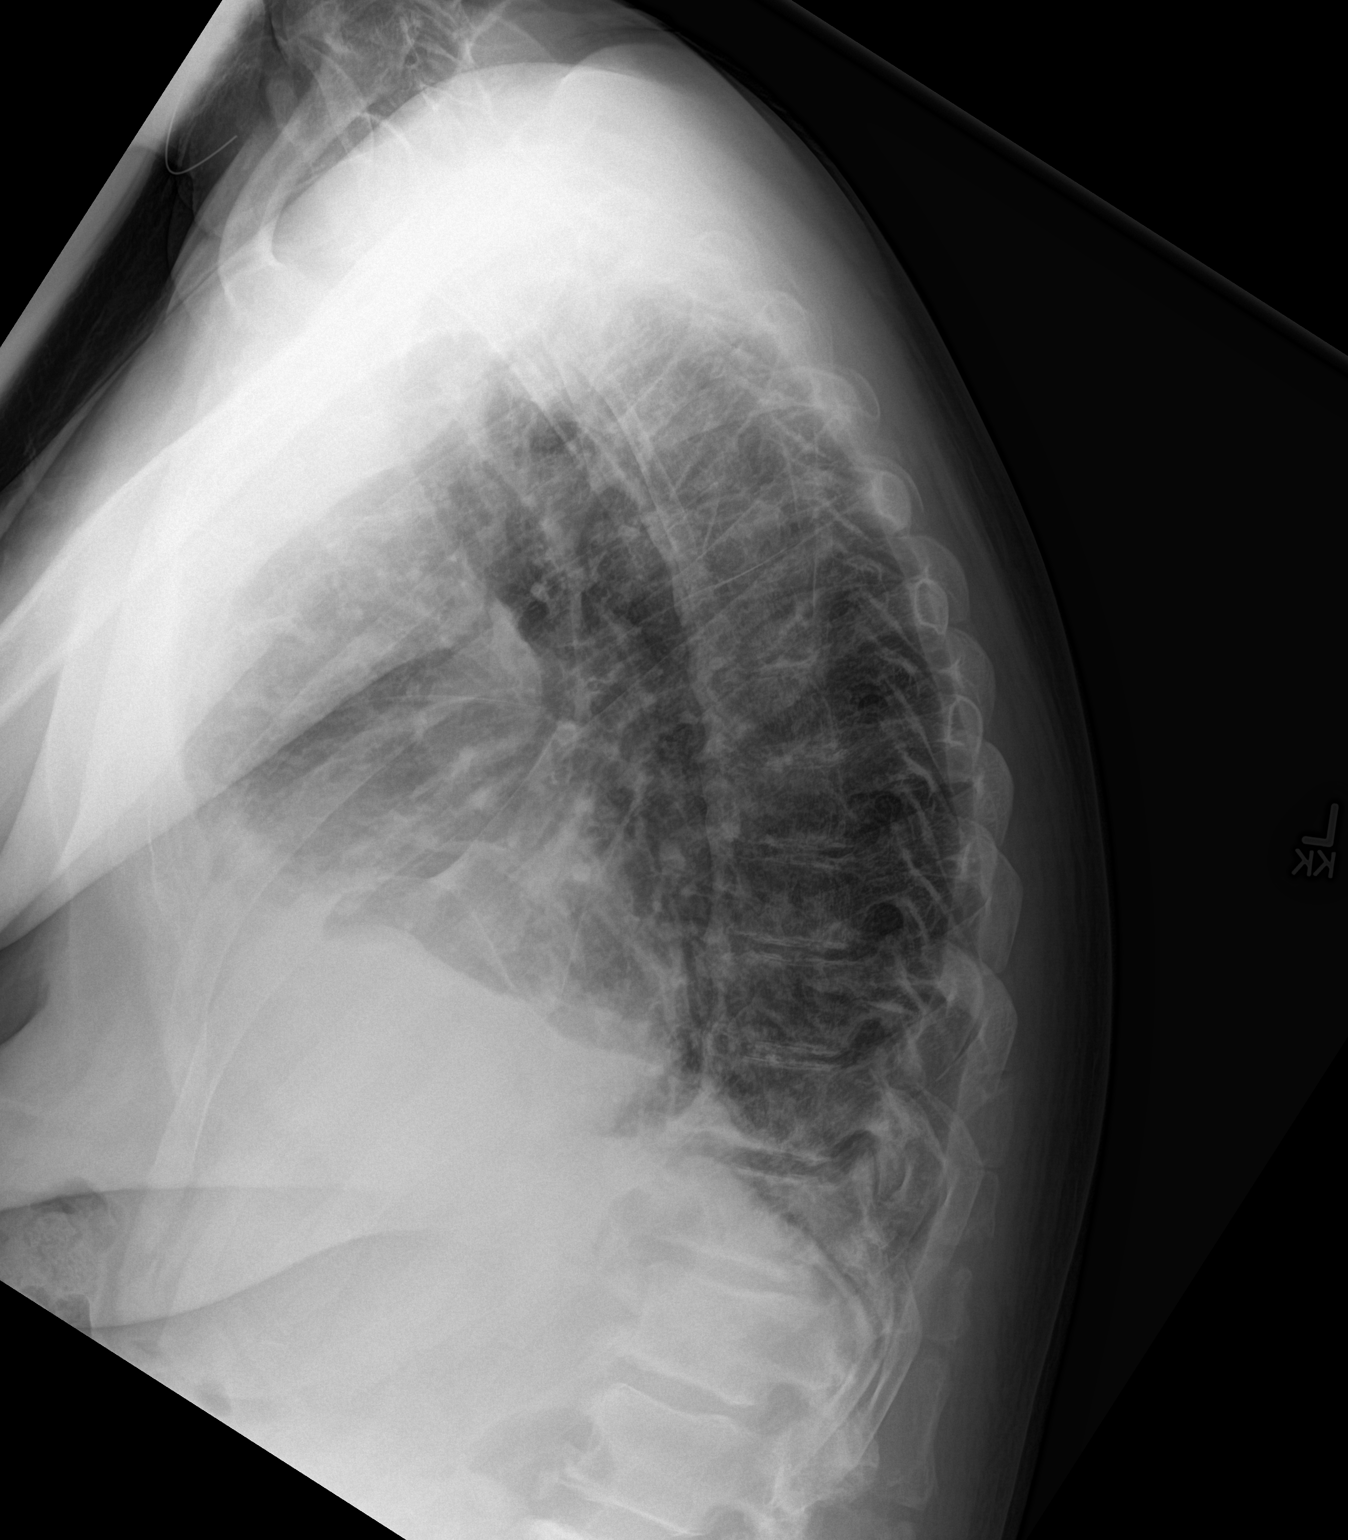

[2 of 2 positions shown; findings below may reference images not displayed]

FINDINGS: Cardiac enlargement. Negative for heart failure or edema. Bibasilar
airspace disease right greater than left with the appearance of
atelectasis. Minimal right pleural effusion. No acute skeletal
abnormality
IMPRESSION: Cardiac enlargement without heart failure

Bibasilar atelectasis/infiltrate and minimal right effusion.

## 2023-04-03 ENCOUNTER — Other Ambulatory Visit: Payer: Self-pay | Admitting: Podiatry

## 2023-04-03 ENCOUNTER — Encounter: Payer: Self-pay | Admitting: Podiatry

## 2023-04-03 DIAGNOSIS — I739 Peripheral vascular disease, unspecified: Secondary | ICD-10-CM

## 2023-04-03 DIAGNOSIS — M792 Neuralgia and neuritis, unspecified: Secondary | ICD-10-CM

## 2023-04-03 DIAGNOSIS — E1142 Type 2 diabetes mellitus with diabetic polyneuropathy: Secondary | ICD-10-CM

## 2023-04-03 DIAGNOSIS — M19171 Post-traumatic osteoarthritis, right ankle and foot: Secondary | ICD-10-CM

## 2023-04-03 DIAGNOSIS — M85871 Other specified disorders of bone density and structure, right ankle and foot: Secondary | ICD-10-CM

## 2023-04-03 DIAGNOSIS — M79671 Pain in right foot: Secondary | ICD-10-CM

## 2023-04-03 DIAGNOSIS — M2041 Other hammer toe(s) (acquired), right foot: Secondary | ICD-10-CM

## 2023-04-03 DIAGNOSIS — M898X9 Other specified disorders of bone, unspecified site: Secondary | ICD-10-CM

## 2023-04-03 DIAGNOSIS — Q667 Congenital pes cavus, unspecified foot: Secondary | ICD-10-CM

## 2023-04-04 ENCOUNTER — Other Ambulatory Visit: Payer: Self-pay | Admitting: Podiatry

## 2023-04-04 DIAGNOSIS — M19171 Post-traumatic osteoarthritis, right ankle and foot: Secondary | ICD-10-CM

## 2023-04-06 ENCOUNTER — Encounter: Payer: Self-pay | Admitting: Podiatry

## 2023-04-13 ENCOUNTER — Ambulatory Visit
Admission: RE | Admit: 2023-04-13 | Discharge: 2023-04-13 | Disposition: A | Discharge status: Home / Self Care | Payer: Medicare HMO | Source: Ambulatory Visit | Attending: Podiatry | Admitting: Podiatry

## 2023-04-13 DIAGNOSIS — M19171 Post-traumatic osteoarthritis, right ankle and foot: Secondary | ICD-10-CM

## 2023-04-26 ENCOUNTER — Other Ambulatory Visit (INDEPENDENT_AMBULATORY_CARE_PROVIDER_SITE_OTHER): Payer: Self-pay | Admitting: Nurse Practitioner

## 2023-04-26 DIAGNOSIS — I739 Peripheral vascular disease, unspecified: Secondary | ICD-10-CM

## 2023-04-26 DIAGNOSIS — N186 End stage renal disease: Secondary | ICD-10-CM

## 2023-04-26 DIAGNOSIS — S12200A Unspecified displaced fracture of third cervical vertebra, initial encounter for closed fracture: Secondary | ICD-10-CM

## 2023-04-26 HISTORY — DX: Unspecified displaced fracture of third cervical vertebra, initial encounter for closed fracture: S12.200A

## 2023-04-27 ENCOUNTER — Ambulatory Visit (INDEPENDENT_AMBULATORY_CARE_PROVIDER_SITE_OTHER): Payer: Medicare HMO | Admitting: Nurse Practitioner

## 2023-04-27 ENCOUNTER — Ambulatory Visit (INDEPENDENT_AMBULATORY_CARE_PROVIDER_SITE_OTHER): Payer: Medicare HMO

## 2023-04-27 ENCOUNTER — Encounter (INDEPENDENT_AMBULATORY_CARE_PROVIDER_SITE_OTHER): Payer: Self-pay | Admitting: Nurse Practitioner

## 2023-04-27 VITALS — BP 100/57 | HR 95 | Resp 16

## 2023-04-27 DIAGNOSIS — E119 Type 2 diabetes mellitus without complications: Secondary | ICD-10-CM | POA: Diagnosis not present

## 2023-04-27 DIAGNOSIS — I739 Peripheral vascular disease, unspecified: Secondary | ICD-10-CM

## 2023-04-27 DIAGNOSIS — Z992 Dependence on renal dialysis: Secondary | ICD-10-CM

## 2023-04-27 DIAGNOSIS — N186 End stage renal disease: Secondary | ICD-10-CM | POA: Diagnosis not present

## 2023-04-27 DIAGNOSIS — I10 Essential (primary) hypertension: Secondary | ICD-10-CM | POA: Diagnosis not present

## 2023-04-28 ENCOUNTER — Emergency Department
Admission: EM | Admit: 2023-04-28 | Discharge: 2023-04-28 | Disposition: A | Discharge status: Home / Self Care | Payer: Medicare HMO | Attending: Student in an Organized Health Care Education/Training Program | Admitting: Student in an Organized Health Care Education/Training Program

## 2023-04-28 ENCOUNTER — Emergency Department: Payer: Medicare HMO

## 2023-04-28 ENCOUNTER — Encounter: Payer: Self-pay | Admitting: Emergency Medicine

## 2023-04-28 ENCOUNTER — Other Ambulatory Visit: Payer: Self-pay

## 2023-04-28 DIAGNOSIS — R110 Nausea: Secondary | ICD-10-CM | POA: Diagnosis not present

## 2023-04-28 DIAGNOSIS — N186 End stage renal disease: Secondary | ICD-10-CM | POA: Diagnosis not present

## 2023-04-28 DIAGNOSIS — Z992 Dependence on renal dialysis: Secondary | ICD-10-CM | POA: Diagnosis not present

## 2023-04-28 DIAGNOSIS — R531 Weakness: Secondary | ICD-10-CM

## 2023-04-28 LAB — CBC
HCT: 39.3 % (ref 36.0–46.0)
Hemoglobin: 12.5 g/dL (ref 12.0–15.0)
MCH: 27.7 pg (ref 26.0–34.0)
MCHC: 31.8 g/dL (ref 30.0–36.0)
MCV: 86.9 fL (ref 80.0–100.0)
Platelets: 232 10*3/uL (ref 150–400)
RBC: 4.52 MIL/uL (ref 3.87–5.11)
RDW: 15 % (ref 11.5–15.5)
WBC: 13 10*3/uL — ABNORMAL HIGH (ref 4.0–10.5)
nRBC: 0 % (ref 0.0–0.2)

## 2023-04-28 LAB — PROTIME-INR
INR: 1.1 (ref 0.8–1.2)
Prothrombin Time: 14 seconds (ref 11.4–15.2)

## 2023-04-28 LAB — DIFFERENTIAL
Abs Immature Granulocytes: 0.07 10*3/uL (ref 0.00–0.07)
Basophils Absolute: 0 10*3/uL (ref 0.0–0.1)
Basophils Relative: 0 %
Eosinophils Absolute: 0.1 10*3/uL (ref 0.0–0.5)
Eosinophils Relative: 1 %
Immature Granulocytes: 1 %
Lymphocytes Relative: 12 %
Lymphs Abs: 1.5 10*3/uL (ref 0.7–4.0)
Monocytes Absolute: 0.8 10*3/uL (ref 0.1–1.0)
Monocytes Relative: 6 %
Neutro Abs: 10.5 10*3/uL — ABNORMAL HIGH (ref 1.7–7.7)
Neutrophils Relative %: 80 %

## 2023-04-28 LAB — COMPREHENSIVE METABOLIC PANEL
ALT: 15 U/L (ref 0–44)
AST: 16 U/L (ref 15–41)
Albumin: 3.8 g/dL (ref 3.5–5.0)
Alkaline Phosphatase: 113 U/L (ref 38–126)
Anion gap: 11 (ref 5–15)
BUN: 13 mg/dL (ref 8–23)
CO2: 27 mmol/L (ref 22–32)
Calcium: 8.9 mg/dL (ref 8.9–10.3)
Chloride: 96 mmol/L — ABNORMAL LOW (ref 98–111)
Creatinine, Ser: 3.28 mg/dL — ABNORMAL HIGH (ref 0.44–1.00)
GFR, Estimated: 14 mL/min — ABNORMAL LOW (ref >60)
Glucose, Bld: 89 mg/dL (ref 70–99)
Potassium: 3.2 mmol/L — ABNORMAL LOW (ref 3.5–5.1)
Sodium: 134 mmol/L — ABNORMAL LOW (ref 135–145)
Total Bilirubin: 0.5 mg/dL (ref 0.3–1.2)
Total Protein: 8.5 g/dL — ABNORMAL HIGH (ref 6.5–8.1)

## 2023-04-28 LAB — APTT: aPTT: 38 seconds — ABNORMAL HIGH (ref 24–36)

## 2023-04-28 LAB — CBG MONITORING, ED: Glucose-Capillary: 85 mg/dL (ref 70–99)

## 2023-04-28 NOTE — Progress Notes (Signed)
Subjective:    Patient ID: Deanna [name], female    DOB: March 13, 1951, 72 y.o.   MRN: [id number] Chief Complaint  Patient presents with   Follow-up    Ref Lateef consult ESRD v-mapp,ref Baker consult PVD    The patient is seen for evaluation for dialysis access.  The patient notes that she has been on dialysis for nearly a year or so.  She is currently maintained via PermCath.  The patient is followed by nephrology.    The patient is right-handed.  The patient has been considering the various methods of dialysis and wishes to proceed with hemodialysis and therefore creation of AV access.  Additionally, patient was referred by her podiatrist due to nonpalpable pulses and concerning for underlying peripheral arterial disease.  No recent shortening of the patient's walking distance or new symptoms consistent with claudication.  No history of rest pain symptoms. No new ulcers or wounds of the lower extremities have occurred.  The patient denies amaurosis fugax or recent TIA symptoms. There are no recent neurological changes noted. There is no history of DVT, PE or superficial thrombophlebitis. No recent episodes of angina or shortness of breath documented.   Today the patient has adequate access for right brachiocephalic AV fistula.  She has an ABI of 1.01 on the right and 1.31 on the left.  TBI 0.92 on the right and 0.94 on the left.  She has an occluded posterior tibial artery bilaterally.  Monophasic tibial waveforms on the right with monophasic/biphasic on the left.  Slightly dampened toe waveforms on the right with stronger waveforms on the left.    Review of Systems  Neurological:  Positive for weakness.  All other systems reviewed and are negative.      Objective:   Physical Exam Vitals reviewed.  HENT:     Head: Normocephalic.  Cardiovascular:     Rate and Rhythm: Normal rate.     Pulses:          Dorsalis pedis pulses are detected w/ Doppler on the right side and  detected w/ Doppler on the left side.       Posterior tibial pulses are detected w/ Doppler on the right side and detected w/ Doppler on the left side.  Pulmonary:     Effort: Pulmonary effort is normal.  Skin:    General: Skin is warm and dry.  Neurological:     Mental Status: She is alert and oriented to person, place, and time.     Motor: Weakness present.     Gait: Gait abnormal.  Psychiatric:        Mood and Affect: Mood normal.        Behavior: Behavior normal.        Thought Content: Thought content normal.        Judgment: Judgment normal.     BP (!) 100/57 (BP Location: Left Arm)   Pulse 95   Resp 16   Past Medical History:  Diagnosis Date   Coronary artery disease    Diabetes mellitus without complication (HCC)    Hypertension    Stroke [facility])     Social History   Socioeconomic History   Marital status: Single    Spouse name: Not on file   Number of children: Not on file   Years of education: Not on file   Highest education level: Not on file  Occupational History   Not on file  Tobacco Use   Smoking status: Never  Smokeless tobacco: Not on file  Substance and Sexual Activity   Alcohol use: No   Drug use: No   Sexual activity: Not on file  Other Topics Concern   Not on file  Social History Narrative   Not on file   Social Determinants of Health   Financial Resource Strain: Not on file  Food Insecurity: Not on file  Transportation Needs: Not on file  Physical Activity: Not on file  Stress: Not on file  Social Connections: Not on file  Intimate Partner Violence: Not on file    Past Surgical History:  Procedure Laterality Date   CARDIAC CATHETERIZATION Left 03/01/2016   Procedure: Left Heart Cath and Coronary Angiography;  Surgeon: Dalia Heading, MD;  Location: [facility] INVASIVE CV LAB;  Service: Cardiovascular;  Laterality: Left;   DIALYSIS/PERMA CATHETER INSERTION Right 01/10/2022   Procedure: DIALYSIS/PERMA CATHETER INSERTION;  Surgeon: Bertram Denver, MD;  Location: [facility] INVASIVE CV LAB;  Service: Cardiovascular;  Laterality: Right;   DIALYSIS/PERMA CATHETER INSERTION N/A 04/12/2022   Procedure: DIALYSIS/PERMA CATHETER INSERTION;  Surgeon: Annice Needy, MD;  Location: [facility] INVASIVE CV LAB;  Service: Cardiovascular;  Laterality: N/A;   INTUBATION-ENDOTRACHEAL WITH TRACHEOSTOMY STANDBY Left 01/14/2022   Procedure: INTUBATION-NASAL WITH TRACHEOSTOMY STANDBY;  Surgeon: Linus Salmons, MD;  Location: [facility] ORS;  Service: ENT;  Laterality: Left;   TEMPORARY DIALYSIS CATHETER N/A 01/04/2022   Procedure: TEMPORARY DIALYSIS CATHETER;  Surgeon: Annice Needy, MD;  Location: [facility] INVASIVE CV LAB;  Service: Cardiovascular;  Laterality: N/A;    History reviewed. No pertinent family history.  Allergies  Allergen Reactions   Ace Inhibitors Swelling   Entresto [Sacubitril-Valsartan] Swelling    Patient said she took both lisinopril and Entresto so it is not clear which one she actually reacted to.  For safety reasons, she has been advised to avoid both ACE inhibitors and Entresto.   Lisinopril Swelling    Severe Angioedema (requiring Nasotracheal intubation)   Tomato    Penicillins Rash       Latest Ref Rng & Units 04/28/2023   11:36 AM 01/27/2022    3:54 AM 01/26/2022    5:24 AM  CBC  WBC 4.0 - 10.5 K/uL 13.0  6.7  6.5   Hemoglobin 12.0 - 15.0 g/dL 02.7  8.6  8.4   Hematocrit 36.0 - 46.0 % 39.3  27.7  27.7   Platelets 150 - 400 K/uL 232  281  289       CMP     Component Value Date/Time   NA 134 (L) 04/28/2023 1136   K 3.2 (L) 04/28/2023 1136   CL 96 (L) 04/28/2023 1136   CO2 27 04/28/2023 1136   GLUCOSE 89 04/28/2023 1136   BUN 13 04/28/2023 1136   CREATININE 3.28 (H) 04/28/2023 1136   CALCIUM 8.9 04/28/2023 1136   PROT 8.5 (H) 04/28/2023 1136   ALBUMIN 3.8 04/28/2023 1136   AST 16 04/28/2023 1136   ALT 15 04/28/2023 1136   ALKPHOS 113 04/28/2023 1136   BILITOT 0.5 04/28/2023 1136   GFRNONAA 14 (L) 04/28/2023 1136      No results found.     Assessment & Plan:   1. End-stage renal disease on hemodialysis (HCC) Recommend:  At this time the patient does not have appropriate extremity access for dialysis.  The patient does not want to utilize a graft and only wants placement of a fistula, based on this her right arm will be better for use.  Patient should  have a right brachiocephalic AV fistula created.  The risks, benefits and alternative therapies were reviewed in detail with the patient.  All questions were answered.  The patient agrees to proceed with surgery.   The patient will follow up with me in the office after the surgery.  2. PAD (peripheral artery disease) (HCC) Today the patient does have some evidence of peripheral arterial disease however she denies claudication-like symptoms because she does not walk extensively enough to elicit them.  There is no rest pain or open wounds or ulcerations.  Based on this it would be prudent to continue monitoring.  Will plan on reevaluating studies and 1 year unless the patient has concerning issues which we will plan for reevaluation.  3. Essential (primary) hypertension Continue antihypertensive medications as already ordered, these medications have been reviewed and there are no changes at this time.  4. Type 2 diabetes mellitus without complication, without long-term current use of insulin (HCC) Continue hypoglycemic medications as already ordered, these medications have been reviewed and there are no changes at this time.  Hgb A1C to be monitored as already arranged by primary service   Current Outpatient Medications on File Prior to Visit  Medication Sig Dispense Refill   aspirin EC 81 MG tablet Take 81 mg by mouth daily.     atorvastatin (LIPITOR) 40 MG tablet Take 40 mg by mouth daily.     ferrous sulfate 325 (65 FE) MG EC tablet Take 325 mg by mouth in the morning.     furosemide (LASIX) 20 MG tablet Take 20 mg by mouth daily.      gabapentin (NEURONTIN) 300 MG capsule Take 300 mg by mouth 3 (three) times daily.     metoprolol tartrate (LOPRESSOR) 25 MG tablet Take 1 tablet (25 mg total) by mouth 2 (two) times daily. 60 tablet 0   Vitamin D, Ergocalciferol, (DRISDOL) 1.25 MG (50000 UNIT) CAPS capsule Take 50,000 Units by mouth once a week.     insulin aspart (NOVOLOG) 100 UNIT/ML injection Inject 5 Units into the skin 3 (three) times daily before meals. 5 units Brk 5 units noon 5 units hs (Patient not taking: Reported on 04/27/2023) 10 mL 11   No current facility-administered medications on file prior to visit.    There are no Patient Instructions on file for this visit. No follow-ups on file.   Georgiana Spinner, NP

## 2023-04-28 NOTE — ED Provider Notes (Signed)
[facility] Provider Note    Event Date/Time   First MD Initiated Contact with Patient 04/28/23 1021     (approximate)   History   Weakness   HPI  Deanna [name] is a 72 y.o. female who presents from dialysis today with chief complaint of left-sided weakness.  She was last feeling normal 1:00 yesterday.  Had a near fainting episode that occurred in PCP office.  Was having some nausea but she has been having this for about a month.  This was given at dialysis.  Today unable to ambulate even with a cane.  Has chronic left-sided deficits but feels that it is more significant than previous     Physical Exam   Triage Vital Signs: ED Triage Vitals  Encounter Vitals Group     BP 04/28/23 1021 114/67     Systolic BP Percentile --      Diastolic BP Percentile --      Pulse Rate 04/28/23 1021 95     Resp 04/28/23 1021 18     Temp 04/28/23 1021 97.9 F (36.6 C)     Temp Source 04/28/23 1021 Oral     SpO2 04/28/23 1019 96 %     Weight 04/28/23 1020 180 lb (81.6 kg)     Height 04/28/23 1020 6' 1.5" (1.867 m)     Head Circumference --      Peak Flow --      Pain Score 04/28/23 1020 10     Pain Loc --      Pain Education --      Exclude from Growth Chart --     Most recent vital signs: Vitals:   04/28/23 1019 04/28/23 1021  BP:  114/67  Pulse:  95  Resp:  18  Temp:  97.9 F (36.6 C)  SpO2: 96% 100%     Constitutional: Alert  Eyes: Conjunctivae are normal.  Head: Atraumatic. Nose: No congestion/rhinnorhea. Mouth/Throat: Mucous membranes are moist.   Neck: Painless ROM.  Cardiovascular:   Good peripheral circulation. Respiratory: Normal respiratory effort.  No retractions.  Gastrointestinal: Soft and nontender.  Musculoskeletal:  no deformity Neurologic:  MAE spontaneously. Chronic left sided weakness as compared to right  No facial droop Skin:  Skin is warm, dry and intact. No rash noted. Psychiatric: Mood and affect are normal. Speech  and behavior are normal.    ED Results / Procedures / Treatments   Labs (all labs ordered are listed, but only abnormal results are displayed) Labs Reviewed  APTT - Abnormal; Notable for the following components:      Result Value   aPTT 38 (*)    All other components within normal limits  CBC - Abnormal; Notable for the following components:   WBC 13.0 (*)    All other components within normal limits  DIFFERENTIAL - Abnormal; Notable for the following components:   Neutro Abs 10.5 (*)    All other components within normal limits  COMPREHENSIVE METABOLIC PANEL - Abnormal; Notable for the following components:   Sodium 134 (*)    Potassium 3.2 (*)    Chloride 96 (*)    Creatinine, Ser 3.28 (*)    Total Protein 8.5 (*)    GFR, Estimated 14 (*)    All other components within normal limits  PROTIME-INR  URINE DRUG SCREEN, QUALITATIVE ([facility] ONLY)  URINALYSIS, ROUTINE W REFLEX MICROSCOPIC  CBG MONITORING, ED     EKG  ED ECG REPORT I, Willy Eddy, the  attending physician, personally viewed and interpreted this ECG.   Date: 04/28/2023  EKG Time: 10:24  Rate: 90  Rhythm: sinus  Axis: normal  Intervals:normal  ST&T Change: no stemi, nonspecific st abn    RADIOLOGY Please see ED Course for my review and interpretation.  I personally reviewed all radiographic images ordered to evaluate for the above acute complaints and reviewed radiology reports and findings.  These findings were personally discussed with the patient.  Please see medical record for radiology report.    PROCEDURES:  Critical Care performed: No  Procedures   MEDICATIONS ORDERED IN ED: Medications - No data to display   IMPRESSION / MDM / ASSESSMENT AND PLAN / ED COURSE  I reviewed the triage vital signs and the nursing notes.                              Differential diagnosis includes, but is not limited to, cva, tia, hypoglycemia, dehydration, electrolyte abnormality, dissection,  sepsis  Patient presenting to the ER for evaluation of symptoms as described above.  Based on symptoms, risk factors and considered above differential, this presenting complaint could reflect a potentially life-threatening illness therefore the patient will be placed on continuous pulse oximetry and telemetry for monitoring.  Laboratory evaluation will be sent to evaluate for the above complaints.  Patient chronically ill-appearing does not appear in any acute distress.  Possible TIA or CVA.  She denies any pain at this time not consistent with ACS or dissection not hypoglycemic.  Does not appear significant dehydrated.  Will evaluate electrolytes.   Clinical Course as of 04/28/23 1549  Sat Apr 28, 2023  1240 CT imaging on my review interpretation without evidence of SDH or IVH.  Given risk factors and presentation we will order MRI to further evaluate. [PR]  1432 Patient will be signed out to oncoming physician pending follow-up MRI. [PR]  1547 MRI without evidence of acute abnormality.  Given reassuring workup patient does appear appropriate for outpatient follow-up.  Patient agreeable to plan. [PR]    Clinical Course User Index [PR] Willy Eddy, MD     FINAL CLINICAL IMPRESSION(S) / ED DIAGNOSES   Final diagnoses:  Weakness  ESRD on dialysis [facility])     Rx / DC Orders   ED Discharge Orders     None        Note:  This document was prepared using Dragon voice recognition software and may include unintentional dictation errors.    Willy Eddy, MD 04/28/23 [phone]

## 2023-04-28 NOTE — H&P (View-Only) (Signed)
 Subjective:    Patient ID: Deanna [name], female    DOB: March 13, 1951, 72 y.o.   MRN: [id number] Chief Complaint  Patient presents with   Follow-up    Ref Lateef consult ESRD v-mapp,ref Baker consult PVD    The patient is seen for evaluation for dialysis access.  The patient notes that she has been on dialysis for nearly a year or so.  She is currently maintained via [facility].  The patient is followed by nephrology.    The patient is right-handed.  The patient has been considering the various methods of dialysis and wishes to proceed with hemodialysis and therefore creation of AV access.  Additionally, patient was referred by her podiatrist due to nonpalpable pulses and concerning for underlying peripheral arterial disease.  No recent shortening of the patient's walking distance or new symptoms consistent with claudication.  No history of rest pain symptoms. No new ulcers or wounds of the lower extremities have occurred.  The patient denies amaurosis fugax or recent TIA symptoms. There are no recent neurological changes noted. There is no history of DVT, PE or superficial thrombophlebitis. No recent episodes of angina or shortness of breath documented.   Today the patient has adequate access for right brachiocephalic AV fistula.  She has an ABI of 1.01 on the right and 1.31 on the left.  TBI 0.92 on the right and 0.94 on the left.  She has an occluded posterior tibial artery bilaterally.  Monophasic tibial waveforms on the right with monophasic/biphasic on the left.  Slightly dampened toe waveforms on the right with stronger waveforms on the left.    Review of Systems  Neurological:  Positive for weakness.  All other systems reviewed and are negative.      Objective:   Physical Exam Vitals reviewed.  HENT:     Head: Normocephalic.  Cardiovascular:     Rate and Rhythm: Normal rate.     Pulses:          Dorsalis pedis pulses are detected w/ Doppler on the right side and  detected w/ Doppler on the left side.       Posterior tibial pulses are detected w/ Doppler on the right side and detected w/ Doppler on the left side.  Pulmonary:     Effort: Pulmonary effort is normal.  Skin:    General: Skin is warm and dry.  Neurological:     Mental Status: She is alert and oriented to person, place, and time.     Motor: Weakness present.     Gait: Gait abnormal.  Psychiatric:        Mood and Affect: Mood normal.        Behavior: Behavior normal.        Thought Content: Thought content normal.        Judgment: Judgment normal.     BP (!) 100/57 (BP Location: Left Arm)   Pulse 95   Resp 16   Past Medical History:  Diagnosis Date   Coronary artery disease    Diabetes mellitus without complication (HCC)    Hypertension    Stroke [facility])     Social History   Socioeconomic History   Marital status: Single    Spouse name: Not on file   Number of children: Not on file   Years of education: Not on file   Highest education level: Not on file  Occupational History   Not on file  Tobacco Use   Smoking status: Never  Smokeless tobacco: Not on file  Substance and Sexual Activity   Alcohol use: No   Drug use: No   Sexual activity: Not on file  Other Topics Concern   Not on file  Social History Narrative   Not on file   Social Determinants of Health   Financial Resource Strain: Not on file  Food Insecurity: Not on file  Transportation Needs: Not on file  Physical Activity: Not on file  Stress: Not on file  Social Connections: Not on file  Intimate Partner Violence: Not on file    Past Surgical History:  Procedure Laterality Date   CARDIAC CATHETERIZATION Left 03/01/2016   Procedure: Left Heart Cath and Coronary Angiography;  Surgeon: Dalia Heading, MD;  Location: [facility] INV[facility]IVE [facility];  Service: Cardiovascular;  Laterality: Left;   DIALYSIS/PERMA CATHETER INSERTION Right 01/10/2022   Procedure: DIALYSIS/PERMA CATHETER INSERTION;  Surgeon: Bertram Denver, MD;  Location: [facility] CV [facility];  Service: Cardiovascular;  Laterality: Right;   DIALYSIS/PERMA CATHETER INSERTION N/A 04/12/2022   Procedure: DIALYSIS/PERMA CATHETER INSERTION;  Surgeon: Annice Needy, MD;  Location: [facility];  Service: Cardiovascular;  Laterality: N/A;   INTUBATION-ENDOTRACHEAL WITH TRACHEOSTOMY STANDBY Left 01/14/2022   Procedure: INTUBATION-NASAL WITH TRACHEOSTOMY STANDBY;  Surgeon: Linus Salmons, MD;  Location: [facility] ORS;  Service: ENT;  Laterality: Left;   TEMPORARY DIALYSIS CATHETER N/A 01/04/2022   Procedure: TEMPORARY DIALYSIS CATHETER;  Surgeon: Annice Needy, MD;  Location: [facility];  Service: Cardiovascular;  Laterality: N/A;    History reviewed. No pertinent family history.  Allergies  Allergen Reactions   Ace Inhibitors Swelling   Entresto [Sacubitril-Valsartan] Swelling    Patient said she took both lisinopril and Entresto so it is not clear which one she actually reacted to.  For safety reasons, she has been advised to avoid both ACE inhibitors and Entresto.   Lisinopril Swelling    Severe Angioedema (requiring Nasotracheal intubation)   Tomato    Penicillins Rash       Latest Ref Rng & Units 04/28/2023   11:36 AM 01/27/2022    3:54 AM 01/26/2022    5:24 AM  CBC  WBC 4.0 - 10.5 K/uL 13.0  6.7  6.5   Hemoglobin 12.0 - 15.0 g/dL 02.7  8.6  8.4   Hematocrit 36.0 - 46.0 % 39.3  27.7  27.7   Platelets 150 - 400 K/uL 232  281  289       CMP     Component Value Date/Time   NA 134 (L) 04/28/2023 1136   K 3.2 (L) 04/28/2023 1136   CL 96 (L) 04/28/2023 1136   CO2 27 04/28/2023 1136   GLUCOSE 89 04/28/2023 1136   BUN 13 04/28/2023 1136   CREATININE 3.28 (H) 04/28/2023 1136   CALCIUM 8.9 04/28/2023 1136   PROT 8.5 (H) 04/28/2023 1136   ALBUMIN 3.8 04/28/2023 1136   AST 16 04/28/2023 1136   ALT 15 04/28/2023 1136   ALKPHOS 113 04/28/2023 1136   BILITOT 0.5 04/28/2023 1136   GFRNONAA 14 (L) 04/28/2023 1136      No results found.     Assessment & Plan:   1. End-stage renal disease on hemodialysis (HCC) Recommend:  At this time the patient does not have appropriate extremity access for dialysis.  The patient does not want to utilize a graft and only wants placement of a fistula, based on this her right arm will be better for use.  Patient should  have a right brachiocephalic AV fistula created.  The risks, benefits and alternative therapies were reviewed in detail with the patient.  All questions were answered.  The patient agrees to proceed with surgery.   The patient will follow up with me in the office after the surgery.  2. PAD (peripheral artery disease) (HCC) Today the patient does have some evidence of peripheral arterial disease however she denies claudication-like symptoms because she does not walk extensively enough to elicit them.  There is no rest pain or open wounds or ulcerations.  Based on this it would be prudent to continue monitoring.  Will plan on reevaluating studies and 1 year unless the patient has concerning issues which we will plan for reevaluation.  3. Essential (primary) hypertension Continue antihypertensive medications as already ordered, these medications have been reviewed and there are no changes at this time.  4. Type 2 diabetes mellitus without complication, without long-term current use of insulin (HCC) Continue hypoglycemic medications as already ordered, these medications have been reviewed and there are no changes at this time.  Hgb A1C to be monitored as already arranged by primary service   Current Outpatient Medications on File Prior to Visit  Medication Sig Dispense Refill   aspirin EC 81 MG tablet Take 81 mg by mouth daily.     atorvastatin (LIPITOR) 40 MG tablet Take 40 mg by mouth daily.     ferrous sulfate 325 (65 FE) MG EC tablet Take 325 mg by mouth in the morning.     furosemide (LASIX) 20 MG tablet Take 20 mg by mouth daily.      gabapentin (NEURONTIN) 300 MG capsule Take 300 mg by mouth 3 (three) times daily.     metoprolol tartrate (LOPRESSOR) 25 MG tablet Take 1 tablet (25 mg total) by mouth 2 (two) times daily. 60 tablet 0   Vitamin D, Ergocalciferol, (DRISDOL) 1.25 MG (50000 UNIT) CAPS capsule Take 50,000 Units by mouth once a week.     insulin aspart (NOVOLOG) 100 UNIT/ML injection Inject 5 Units into the skin 3 (three) times daily before meals. 5 units Brk 5 units noon 5 units hs (Patient not taking: Reported on 04/27/2023) 10 mL 11   No current facility-administered medications on file prior to visit.    There are no Patient Instructions on file for this visit. No follow-ups on file.   Georgiana Spinner, NP

## 2023-04-28 NOTE — ED Triage Notes (Signed)
Pt arrives via EMS from dialysis today with reports of left sided weakness. LSN yesterday at 1 pm before doc appt. Also endorses syncopal episode at PCP office yesterday. 4 mg zofran given at dialysis and reports nausea for a month. Ambulatory with cane. Hx of CVA with left sided deficits.

## 2023-04-29 ENCOUNTER — Encounter (INDEPENDENT_AMBULATORY_CARE_PROVIDER_SITE_OTHER): Payer: Self-pay | Admitting: Nurse Practitioner

## 2023-05-01 ENCOUNTER — Emergency Department: Payer: Medicare HMO

## 2023-05-01 ENCOUNTER — Other Ambulatory Visit: Payer: Self-pay

## 2023-05-01 ENCOUNTER — Encounter: Payer: Self-pay | Admitting: Emergency Medicine

## 2023-05-01 ENCOUNTER — Emergency Department
Admission: EM | Admit: 2023-05-01 | Discharge: 2023-05-01 | Disposition: A | Discharge status: Home / Self Care | Payer: Medicare HMO | Attending: Emergency Medicine | Admitting: Emergency Medicine

## 2023-05-01 DIAGNOSIS — Z992 Dependence on renal dialysis: Secondary | ICD-10-CM | POA: Diagnosis not present

## 2023-05-01 DIAGNOSIS — I120 Hypertensive chronic kidney disease with stage 5 chronic kidney disease or end stage renal disease: Secondary | ICD-10-CM | POA: Insufficient documentation

## 2023-05-01 DIAGNOSIS — S12290A Other displaced fracture of third cervical vertebra, initial encounter for closed fracture: Secondary | ICD-10-CM | POA: Diagnosis not present

## 2023-05-01 DIAGNOSIS — M25511 Pain in right shoulder: Secondary | ICD-10-CM | POA: Diagnosis not present

## 2023-05-01 DIAGNOSIS — I2510 Atherosclerotic heart disease of native coronary artery without angina pectoris: Secondary | ICD-10-CM | POA: Diagnosis not present

## 2023-05-01 DIAGNOSIS — W07XXXA Fall from chair, initial encounter: Secondary | ICD-10-CM | POA: Diagnosis not present

## 2023-05-01 DIAGNOSIS — E1122 Type 2 diabetes mellitus with diabetic chronic kidney disease: Secondary | ICD-10-CM | POA: Insufficient documentation

## 2023-05-01 DIAGNOSIS — I6523 Occlusion and stenosis of bilateral carotid arteries: Secondary | ICD-10-CM | POA: Insufficient documentation

## 2023-05-01 DIAGNOSIS — M542 Cervicalgia: Secondary | ICD-10-CM | POA: Insufficient documentation

## 2023-05-01 DIAGNOSIS — W19XXXA Unspecified fall, initial encounter: Secondary | ICD-10-CM | POA: Diagnosis not present

## 2023-05-01 DIAGNOSIS — S12291A Other nondisplaced fracture of third cervical vertebra, initial encounter for closed fracture: Secondary | ICD-10-CM | POA: Diagnosis not present

## 2023-05-01 DIAGNOSIS — S12200A Unspecified displaced fracture of third cervical vertebra, initial encounter for closed fracture: Secondary | ICD-10-CM

## 2023-05-01 DIAGNOSIS — S199XXA Unspecified injury of neck, initial encounter: Secondary | ICD-10-CM | POA: Diagnosis present

## 2023-05-01 DIAGNOSIS — S12201A Unspecified nondisplaced fracture of third cervical vertebra, initial encounter for closed fracture: Secondary | ICD-10-CM

## 2023-05-01 DIAGNOSIS — N186 End stage renal disease: Secondary | ICD-10-CM | POA: Insufficient documentation

## 2023-05-01 MED ORDER — OXYCODONE-ACETAMINOPHEN 5-325 MG PO TABS
1.0000 | ORAL_TABLET | Freq: Once | ORAL | Status: AC
  Administered 2023-05-01: 1 via ORAL
  Filled 2023-05-01: qty 1

## 2023-05-01 MED ORDER — OXYCODONE-ACETAMINOPHEN 5-325 MG PO TABS: 1.0000 | ORAL_TABLET | ORAL | 0 refills | Status: DC | PRN

## 2023-05-01 NOTE — Discharge Instructions (Addendum)
You must stay in the c-collar at all times. Follow-up with Dr. Katrinka Blazing, if you have not heard from his office in 1 to 2 days please call them for an appointment

## 2023-05-01 NOTE — ED Notes (Signed)
This nurse went in to review discharge instructions  States she wants to be admitted  Provider went back in to review discharge instructions

## 2023-05-01 NOTE — ED Notes (Signed)
Dr Katrinka Blazing went in to see pt

## 2023-05-01 NOTE — ED Triage Notes (Signed)
Pt comes with c/o fall on Sunday. Pt was at dialysis and they called EMS bc pt said she couldn't get out of chair. Pt states right arm pain and pt placed in c-collar.

## 2023-05-01 NOTE — ED Notes (Signed)
Pt requesting food. Pt denied sandwich tray. Wants something from the cafeteria.

## 2023-05-01 NOTE — ED Provider Notes (Addendum)
[facility] Provider Note    Event Date/Time   First MD Initiated Contact with Patient 05/01/23 1105     (approximate)   History   Fall   HPI  Quinita Hook is a 72 y.o. female dialysis patient with history of stroke, hypertension, CAD, diabetes presents emergency department after a fall on Sunday.  Patient states she heard a pop in her neck.  No numbness going down into her arm.  Also complaining of right shoulder pain.  Patient states area in her neck is very tender.  States she has brittle bones secondary to dialysis.      Physical Exam   Triage Vital Signs: ED Triage Vitals  Encounter Vitals Group     BP 05/01/23 1050 (!) 115/56     Systolic BP Percentile --      Diastolic BP Percentile --      Pulse Rate 05/01/23 1050 87     Resp 05/01/23 1050 16     Temp 05/01/23 1050 98.1 F (36.7 C)     Temp Source 05/01/23 1050 Oral     SpO2 05/01/23 1050 96 %     Weight --      Height --      Head Circumference --      Peak Flow --      Pain Score 05/01/23 1044 10     Pain Loc --      Pain Education --      Exclude from Growth Chart --     Most recent vital signs: Vitals:   05/01/23 1502 05/01/23 1624  BP: 120/60 120/64  Pulse: 80 78  Resp: 16 16  Temp: 98 F (36.7 C) 97.8 F (36.6 C)  SpO2: 96% 96%     General: Awake, no distress.   CV:  Good peripheral perfusion. regular rate and  rhythm Resp:  Normal effort. Lungs cta Abd:  No distention.   Other:  C-spine tender, right shoulder tender, grips equal bilaterally   ED Results / Procedures / Treatments   Labs (all labs ordered are listed, but only abnormal results are displayed) Labs Reviewed  COMPREHENSIVE METABOLIC PANEL  CBC WITH DIFFERENTIAL/PLATELET     EKG     RADIOLOGY CT of the head cervical spine, x-ray right shoulder    PROCEDURES:   Procedures   MEDICATIONS ORDERED IN ED: Medications  oxyCODONE-acetaminophen (PERCOCET/ROXICET) 5-325 MG per  tablet 1 tablet (has no administration in time range)  oxyCODONE-acetaminophen (PERCOCET/ROXICET) 5-325 MG per tablet 1 tablet (1 tablet Oral Given 05/01/23 1136)     IMPRESSION / MDM / ASSESSMENT AND PLAN / ED COURSE  I reviewed the triage vital signs and the nursing notes.                              Differential diagnosis includes, but is not limited to, fracture, contusion, strain  Patient's presentation is most consistent with acute presentation with potential threat to life or bodily function.   CT of the head and cervical spine X-ray of the right shoulder   Patient given Percocet p.o.  Consult with radiologist concerning CT of the cervical spine.  Radiologist spoke with Dr. Derrill Kay.  Has C-spine fracture of the C3-C4, recommends MRI C-spine  CT of the head, did independently review and interpret the radiologist reading as being negative for any acute abnormality  MRI C-spine shows the C3-C4 osteophyte bridge fracture.  No swelling  towards the spinal canal  Consult to Dr. Katrinka Blazing from neurosurgery  Reviewed the images and is coming to see the patient  Dr. Katrinka Blazing consulted with other surgeons as to the danger of taking her to surgery due to her health problems versus benefit.  Consensus is to discharge her home and stay in the c-collar continuously.  Follow-up outpatient and hopefully the area will heal on its own.  I did explain the findings to the patient.  Gave her Percocet here before discharge.  Prescription for Percocet sent to the pharmacy.  She is to follow-up with Dr. Michaelle Copas office.  She can call for an appointment if she has not heard from them in 1 to 2 days.  She is in agreement treatment plan.  Discharged stable condition.  ----------------------------------------- 6:42 PM on 05/01/2023 ----------------------------------------- Patient still does not want to leave.  Is stating that she is going to go to [facility] to get surgery.  Also states if she goes home she should  come to take the collar off.  I did explain to her that if she takes the collar off after she has been instructed to leave it on it will be her fault not ours if she becomes paralyzed from the neck down.  Patient continues to be argumentative.  Discharged in stable condition.   FINAL CLINICAL IMPRESSION(S) / ED DIAGNOSES   Final diagnoses:  Closed displaced fracture of third cervical vertebra, unspecified fracture morphology, initial encounter (HCC)     Rx / DC Orders   ED Discharge Orders          Ordered    oxyCODONE-acetaminophen (PERCOCET) 5-325 MG tablet  Every 4 hours PRN        05/01/23 1816             Note:  This document was prepared using Dragon voice recognition software and may include unintentional dictation errors.    Faythe Ghee, PA-C 05/01/23 1819    Faythe Ghee, PA-C 05/01/23 1843    Phineas Semen, MD 05/04/23 [phone]

## 2023-05-01 NOTE — Consult Note (Signed)
Consulting Department:  Emergency Dept, [facility]  Primary Physician:  [facility]  Chief Complaint:  C-Spine Fracture  History of Present Illness: 05/01/2023 Deanna [name] is a 72 y.o. female who presents with the chief complaint of Cspine fracture. She has a history of dish arthritis, end stage renal disease on dialysis, CAD, Stroke and HTN. Had a fall on Sunday but waited to come in until today. Has no neurologic symptoms at this point. Some neck pain that improved with percocet.   Review of Systems:  A 10 point review of systems is negative, except for the pertinent positives and negatives detailed in the HPI.  Past Medical History: Past Medical History:  Diagnosis Date   Coronary artery disease    Diabetes mellitus without complication (HCC)    Hypertension    Stroke [facility])     Past Surgical History: Past Surgical History:  Procedure Laterality Date   CARDIAC CATHETERIZATION Left 03/01/2016   Procedure: Left Heart Cath and Coronary Angiography;  Surgeon: Dalia Heading, MD;  Location: [facility] INVASIVE CV LAB;  Service: Cardiovascular;  Laterality: Left;   DIALYSIS/PERMA CATHETER INSERTION Right 01/10/2022   Procedure: DIALYSIS/PERMA CATHETER INSERTION;  Surgeon: Bertram Denver, MD;  Location: [facility] INVASIVE CV LAB;  Service: Cardiovascular;  Laterality: Right;   DIALYSIS/PERMA CATHETER INSERTION N/A 04/12/2022   Procedure: DIALYSIS/PERMA CATHETER INSERTION;  Surgeon: Annice Needy, MD;  Location: [facility] INVASIVE CV LAB;  Service: Cardiovascular;  Laterality: N/A;   INTUBATION-ENDOTRACHEAL WITH TRACHEOSTOMY STANDBY Left 01/14/2022   Procedure: INTUBATION-NASAL WITH TRACHEOSTOMY STANDBY;  Surgeon: Linus Salmons, MD;  Location: [facility] ORS;  Service: ENT;  Laterality: Left;   TEMPORARY DIALYSIS CATHETER N/A 01/04/2022   Procedure: TEMPORARY DIALYSIS CATHETER;  Surgeon: Annice Needy, MD;  Location: [facility] INVASIVE CV LAB;  Service: Cardiovascular;  Laterality: N/A;     Allergies: Allergies as of 05/01/2023 - Review Complete 05/01/2023  Allergen Reaction Noted   Ace inhibitors Swelling 01/14/2022   Entresto [sacubitril-valsartan] Swelling 01/19/2022   Lisinopril Swelling 01/16/2022   Tomato  01/10/2022   Penicillins Rash 03/01/2016    Medications: No current facility-administered medications for this encounter.  Current Outpatient Medications:    oxyCODONE-acetaminophen (PERCOCET) 5-325 MG tablet, Take 1 tablet by mouth every 4 (four) hours as needed for severe pain., Disp: 20 tablet, Rfl: 0   aspirin EC 81 MG tablet, Take 81 mg by mouth daily., Disp: , Rfl:    atorvastatin (LIPITOR) 40 MG tablet, Take 40 mg by mouth daily., Disp: , Rfl:    ferrous sulfate 325 (65 FE) MG EC tablet, Take 325 mg by mouth in the morning., Disp: , Rfl:    furosemide (LASIX) 20 MG tablet, Take 20 mg by mouth daily., Disp: , Rfl:    gabapentin (NEURONTIN) 300 MG capsule, Take 300 mg by mouth 3 (three) times daily., Disp: , Rfl:    insulin aspart (NOVOLOG) 100 UNIT/ML injection, Inject 5 Units into the skin 3 (three) times daily before meals. 5 units Brk 5 units noon 5 units hs (Patient not taking: Reported on 04/27/2023), Disp: 10 mL, Rfl: 11   metoprolol tartrate (LOPRESSOR) 25 MG tablet, Take 1 tablet (25 mg total) by mouth 2 (two) times daily., Disp: 60 tablet, Rfl: 0   Vitamin D, Ergocalciferol, (DRISDOL) 1.25 MG (50000 UNIT) CAPS capsule, Take 50,000 Units by mouth once a week., Disp: , Rfl:    Social History: Social History   Tobacco Use   Smoking status: Never  Substance Use Topics  Alcohol use: No   Drug use: No    Family Medical History: History reviewed. No pertinent family history.  Physical Examination: Vitals:   05/01/23 1502 05/01/23 1624  BP: 120/60 120/64  Pulse: 80 78  Resp: 16 16  Temp: 98 F (36.7 C) 97.8 F (36.6 C)  SpO2: 96% 96%     General: Patient is well developed, well nourished, calm, collected, and in no apparent  distress.  NEUROLOGICAL:  General: In no acute distress.   Awake, alert, oriented to person, place, and time.  Pupils equal round and reactive to light.  Facial tone is symmetric.  Tongue protrusion is midline.  There is no pronator drift.  ROM of spine: neck tenderness to palpation    Strength: Full strength in all 4 extremities without any gross deficit.  Bilateral upper and lower extremity sensation is intact to light touch. Reflexes are 2+ and symmetric at the biceps, triceps, brachioradialis, patella and achilles. Hoffman's is absent.  Clonus is not present.    Imaging:   Narrative & Impression  CLINICAL DATA:  Fall 2 days ago.  Right arm pain.   EXAM: MRI CERVICAL SPINE WITHOUT CONTRAST   TECHNIQUE: Multiplanar, multisequence MR imaging of the cervical spine was performed. No intravenous contrast was administered.   COMPARISON:  Same-day CT cervical spine, cervical spine MRI 12/28/2021.   FINDINGS: Alignment: Normal.   Vertebrae: Vertebral body heights are preserved. There is thin STIR signal abnormality traversing the large bridging C3-C4 anterior osteophyte consistent with known nondisplaced fracture seen on same-day CT. There is no epidural hematoma. There is no other evidence of acute fracture. Background marrow signal is normal. There is no suspicious marrow signal abnormality.   There are bulky bridging anterior osteophytes throughout the cervical spine consistent with diffuse idiopathic skeletal hyperostosis.   Cord: Normal in signal and morphology.   Posterior Fossa, vertebral arteries, paraspinal tissues: There are small remote infarcts in the right cerebellar hemisphere and pons. The imaged posterior fossa is otherwise unremarkable. There is mild prevertebral edema at the level of the fracture (11-18). There is no evidence of ligamentous injury.   Disc levels:   C2-C3: There is a shallow posterior disc osteophyte complex without significant  spinal canal or neural foraminal stenosis.   C3-C4: There is a posterior disc osteophyte complex with mild bilateral uncovertebral ridging and facet arthropathy with ligamentum flavum thickening resulting in mild spinal canal stenosis without significant neural foraminal stenosis, not significantly changed.   C4-C5: Mild uncovertebral and facet arthropathy without significant spinal canal or neural foraminal stenosis   C5-C6: There is a shallow posterior disc osteophyte complex with right worse than left uncovertebral ridging and mild facet arthropathy with ligamentum flavum thickening resulting in mild spinal canal stenosis and mild-to-moderate bilateral neural foraminal stenosis, unchanged.   C6-C7: There is a shallow posterior disc osteophyte complex with mild uncovertebral and facet arthropathy resulting in moderate right and no significant left neural foraminal stenosis and no significant spinal canal stenosis, unchanged   C7-T1: Mild facet arthropathy without significant spinal canal or neural foraminal stenosis.   IMPRESSION: 1. Bulky flowing anterior osteophytes consistent with diffuse idiopathic skeletal hyperostosis with an acute fracture through the bridging osteophyte at C3-C4 as seen on same-day CT. Mild prevertebral soft tissue edema but no epidural hematoma or evidence of ligamentous injury. 2. Multilevel degenerative changes throughout the cervical spine as above, unchanged since the prior MRI from 2023.     Electronically Signed   By: Selena Lesser.D.  On: 05/01/2023 12:57   Narrative & Impression  CLINICAL DATA:  fall   EXAM: CT HEAD WITHOUT CONTRAST   CT CERVICAL SPINE WITHOUT CONTRAST   TECHNIQUE: Multidetector CT imaging of the head and cervical spine was performed following the standard protocol without intravenous contrast. Multiplanar CT image reconstructions of the cervical spine were also generated.   RADIATION DOSE REDUCTION: This exam  was performed according to the departmental dose-optimization program which includes automated exposure control, adjustment of the mA and/or kV according to patient size and/or use of iterative reconstruction technique.   COMPARISON:  CT head 04/28/23   FINDINGS: CT HEAD FINDINGS   Brain: No evidence of acute infarction, hemorrhage, hydrocephalus, extra-axial collection or mass lesion/mass effect. Chronic infarct in the left caudate head. Sequela of moderate chronic microvascular ischemic change.   Vascular: No hyperdense vessel or unexpected calcification.   Skull: Normal. Negative for fracture or focal lesion.   Sinuses/Orbits: No middle ear or mastoid effusion. Paranasal sinuses are clear. Bilateral lens replacement. Orbits are otherwise unremarkable.   Other: None.   CT CERVICAL SPINE FINDINGS   Alignment: Normal.   Skull base and vertebrae: There is an acute nondisplaced fracture through the bridging osteophyte at the C3-C4 level (series 6, image 22). 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   Soft tissues and spinal canal: Mild prevertebral soft tissue swelling of the C3-C4 level. No visible canal hematoma.   Disc levels: Mild-to-moderate multilevel stenosis in the upper and midcervical spine   Upper chest: Negative.   Other: Atherosclerotic calcifications of the carotid bifurcation bilaterally   IMPRESSION: 1. No CT evidence of intracranial injury. 2. Acute nondisplaced fracture through the anterior bridging osteophyte at the C3-C4 level. Mild prevertebral soft tissue swelling. No visible canal hematoma. Recommend further evaluation with a cervical spine MRI     Electronically Signed   By: Lorenza Cambridge M.D.   On: 05/01/2023 11:34      I have personally reviewed the images and agree with the above interpretation.  Importantly, no fracture line is mostly through the bridging osteophyte, the uncovertebral joints continue to be fused without evidence of  fracture line through them.  Solid bony fusion remains present on CT scan.  MRI supports this finding.  Labs:    Latest Ref Rng & Units 04/28/2023   11:36 AM 01/27/2022    3:54 AM 01/26/2022    5:24 AM  CBC  WBC 4.0 - 10.5 K/uL 13.0  6.7  6.5   Hemoglobin 12.0 - 15.0 g/dL 16.1  8.6  8.4   Hematocrit 36.0 - 46.0 % 39.3  27.7  27.7   Platelets 150 - 400 K/uL 232  281  289        Assessment and Plan: Ms. Klamm is a pleasant 72 y.o. female with history of a fall on Sunday.  She presented today in a delayed fashion.  Has neck pain.  He has a history of DISH arthritis.  He is neuro intact without any neurologic symptoms.  Has some neck pain.  Detailed review of her CT scan shows a anterior osteophyte fracture which is nondisplaced, thankfully this does not extend through the uncovertebral joints which are solidly fused and likely explain the reason why she has no displacement.  MRI supports this finding with no posterior extension or ligamentous injuries.  She has several severe comorbidities including coronary artery disease, history of a stroke, end-stage renal disease secondary to uncontrolled diabetes.  Given her complex medical history with  significant intraoperative risk of adverse events, along with her continued bony fusion at the level of the osteophyte fracture, we feel that she would benefit from conservative therapy more than the risk of acute surgical intervention.  We would like to follow her closely with serial x-rays in clinic.  Will plan on seeing her next week for upright x-rays to continue in the brace.  I explained to her the importance of wearing the brace at all times.  She did state that she would not want to be admitted and that was the reason that she did not come in initially on Sunday.  I have discussed the condition with the patient, including showing the radiographs and discussing treatment options in layman's terms.  The patient may benefit from conservative management.   Thus, I have recommended the following: Collar with close follow-up.   Lovenia Kim, MD/MSCR Dept. of Neurosurgery

## 2023-05-01 NOTE — ED Notes (Signed)
See triage note  Presents via EMS s/p fall  States she fell when trying to change chairs  Landed on her right side   States she felt a crack in her neck  Presents with c-collar in place

## 2023-05-08 ENCOUNTER — Other Ambulatory Visit: Payer: Self-pay

## 2023-05-08 DIAGNOSIS — S12201D Unspecified nondisplaced fracture of third cervical vertebra, subsequent encounter for fracture with routine healing: Secondary | ICD-10-CM

## 2023-05-09 ENCOUNTER — Ambulatory Visit
Admission: RE | Admit: 2023-05-09 | Discharge: 2023-05-09 | Disposition: A | Discharge status: Home / Self Care | Payer: Medicare HMO | Source: Ambulatory Visit | Attending: Neurosurgery | Admitting: Neurosurgery

## 2023-05-09 ENCOUNTER — Encounter: Payer: Self-pay | Admitting: Neurosurgery

## 2023-05-09 ENCOUNTER — Ambulatory Visit (INDEPENDENT_AMBULATORY_CARE_PROVIDER_SITE_OTHER): Payer: Medicare HMO | Admitting: Neurosurgery

## 2023-05-09 VITALS — BP 118/74 | Ht 73.5 in

## 2023-05-09 DIAGNOSIS — W19XXXD Unspecified fall, subsequent encounter: Secondary | ICD-10-CM | POA: Diagnosis not present

## 2023-05-09 DIAGNOSIS — S12291D Other nondisplaced fracture of third cervical vertebra, subsequent encounter for fracture with routine healing: Secondary | ICD-10-CM

## 2023-05-09 DIAGNOSIS — S12201D Unspecified nondisplaced fracture of third cervical vertebra, subsequent encounter for fracture with routine healing: Secondary | ICD-10-CM

## 2023-05-09 NOTE — Progress Notes (Signed)
   Date of initial consult: 05/01/2023  HISTORY OF PRESENT ILLNESS: 05/09/2023 [name] is a 72 year old woman with a very complicated past medical history who is here today to follow-up from a C3 anterior osteophyte fracture.  She has good bone laterally, MRI showed a small fracture line in the anterior cervical spine.  We have been treating her with a collar given her history of significant falls.  She has not had any new neurologic symptoms.  Overall doing well.  Get x-rays today.  PHYSICAL EXAMINATION:   Vitals:   05/09/23 1511  BP: 118/74   General: Patient is well developed, well nourished, calm, collected, and in no apparent distress.  NEUROLOGICAL:  General: In no acute distress.  Awake, alert, oriented to person, place, and time. Pupils equal round and reactive to light.   Strength: Side Biceps Triceps Deltoid Interossei Grip Wrist Ext. Wrist Flex.  R 5 5 5 5 5 5 5   L 5 5 5 5 5 5 5     ROS (Neurologic): Negative except as noted above  IMAGING:   ASSESSMENT/PLAN:  Deanna [name] is doing well with her conservative management for her C3 anterior osteophyte fracture.  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  She had a small linear fracture in her anterior bridging.  This did not extend all the way through nor did disrupt her disc space.  Given her high level of morbidity given her extremely complicated past medical history we chose to treat with conservative collar management.  Will plan on keeping this for total of 2 to 3 months.  Will get a follow-up cervical spine CT and see her back approximately 6 weeks.  Hopefully can liberate her from the cervical collar at that time.  Lovenia Kim, MD/MSCR Department of Neurosurgery

## 2023-05-10 ENCOUNTER — Telehealth (INDEPENDENT_AMBULATORY_CARE_PROVIDER_SITE_OTHER): Payer: Self-pay

## 2023-05-10 NOTE — Telephone Encounter (Signed)
Spoke with the patient and she is scheduled with Dr. Wyn Quaker on 05/17/23 at the [facility]. Pre-op is scheduled on 05/15/23 at 1:00 pm at the [facility]. Pre-surgical instructions were discussed and patient stated she wrote them down. This will be mailed.

## 2023-05-10 NOTE — Telephone Encounter (Signed)
I attempted to contact the patient to schedule her for a right arm brachial cephalic av fistula with Dr. Wyn Quaker. A message was left for a return call on the mobile phone voicemail and the home voicemail box was not set up.

## 2023-05-14 ENCOUNTER — Other Ambulatory Visit (INDEPENDENT_AMBULATORY_CARE_PROVIDER_SITE_OTHER): Payer: Self-pay | Admitting: Nurse Practitioner

## 2023-05-14 DIAGNOSIS — N186 End stage renal disease: Secondary | ICD-10-CM

## 2023-05-14 NOTE — Telephone Encounter (Signed)
Patient called wanting to know if she could have dialysis after her surgery on 05/17/23 with Dr. Wyn Quaker. A message was sent to Sheppard Plumber NP, per her dialysis after surgery is only for inpatients. The patient was informed and stated she will keep her surgery day and work it out.

## 2023-05-15 ENCOUNTER — Other Ambulatory Visit (INDEPENDENT_AMBULATORY_CARE_PROVIDER_SITE_OTHER): Payer: Self-pay | Admitting: Nurse Practitioner

## 2023-05-15 ENCOUNTER — Other Ambulatory Visit: Payer: Self-pay

## 2023-05-15 ENCOUNTER — Encounter
Admission: RE | Admit: 2023-05-15 | Discharge: 2023-05-15 | Disposition: A | Discharge status: Home / Self Care | Payer: Medicare HMO | Source: Ambulatory Visit | Attending: Vascular Surgery | Admitting: Vascular Surgery

## 2023-05-15 VITALS — Ht 73.5 in | Wt 175.0 lb

## 2023-05-15 DIAGNOSIS — Z992 Dependence on renal dialysis: Secondary | ICD-10-CM | POA: Insufficient documentation

## 2023-05-15 DIAGNOSIS — N186 End stage renal disease: Secondary | ICD-10-CM | POA: Diagnosis not present

## 2023-05-15 DIAGNOSIS — Z01812 Encounter for preprocedural laboratory examination: Secondary | ICD-10-CM

## 2023-05-15 DIAGNOSIS — E1122 Type 2 diabetes mellitus with diabetic chronic kidney disease: Secondary | ICD-10-CM | POA: Diagnosis not present

## 2023-05-15 DIAGNOSIS — Z01818 Encounter for other preprocedural examination: Secondary | ICD-10-CM | POA: Diagnosis present

## 2023-05-15 DIAGNOSIS — E119 Type 2 diabetes mellitus without complications: Secondary | ICD-10-CM

## 2023-05-15 HISTORY — DX: Pneumonia, unspecified organism: J18.9

## 2023-05-15 HISTORY — DX: Type 2 diabetes mellitus with diabetic neuropathy, unspecified: E11.40

## 2023-05-15 HISTORY — DX: Hyperlipidemia, unspecified: E78.5

## 2023-05-15 HISTORY — DX: Heart failure, unspecified: I50.9

## 2023-05-15 HISTORY — DX: Dependence on renal dialysis: Z99.2

## 2023-05-15 HISTORY — DX: Secondary hyperparathyroidism of renal origin: N25.81

## 2023-05-15 HISTORY — DX: Gastro-esophageal reflux disease without esophagitis: K21.9

## 2023-05-15 HISTORY — DX: Anemia, unspecified: D64.9

## 2023-05-15 HISTORY — DX: End stage renal disease: N18.6

## 2023-05-15 LAB — TYPE AND SCREEN
ABO/RH(D): A NEG
Antibody Screen: NEGATIVE

## 2023-05-15 LAB — CBC WITH DIFFERENTIAL/PLATELET
Abs Immature Granulocytes: 0.09 10*3/uL — ABNORMAL HIGH (ref 0.00–0.07)
Basophils Absolute: 0.1 10*3/uL (ref 0.0–0.1)
Basophils Relative: 1 %
Eosinophils Absolute: 0.2 10*3/uL (ref 0.0–0.5)
Eosinophils Relative: 2 %
HCT: 35.6 % — ABNORMAL LOW (ref 36.0–46.0)
Hemoglobin: 11.7 g/dL — ABNORMAL LOW (ref 12.0–15.0)
Immature Granulocytes: 1 %
Lymphocytes Relative: 15 %
Lymphs Abs: 1.7 10*3/uL (ref 0.7–4.0)
MCH: 27.9 pg (ref 26.0–34.0)
MCHC: 32.9 g/dL (ref 30.0–36.0)
MCV: 84.8 fL (ref 80.0–100.0)
Monocytes Absolute: 0.6 10*3/uL (ref 0.1–1.0)
Monocytes Relative: 6 %
Neutro Abs: 8.4 10*3/uL — ABNORMAL HIGH (ref 1.7–7.7)
Neutrophils Relative %: 75 %
Platelets: 286 10*3/uL (ref 150–400)
RBC: 4.2 MIL/uL (ref 3.87–5.11)
RDW: 14.7 % (ref 11.5–15.5)
WBC: 11.1 10*3/uL — ABNORMAL HIGH (ref 4.0–10.5)
nRBC: 0 % (ref 0.0–0.2)

## 2023-05-15 LAB — BASIC METABOLIC PANEL
Anion gap: 14 (ref 5–15)
BUN: 13 mg/dL (ref 8–23)
CO2: 28 mmol/L (ref 22–32)
Calcium: 8.9 mg/dL (ref 8.9–10.3)
Chloride: 93 mmol/L — ABNORMAL LOW (ref 98–111)
Creatinine, Ser: 3.21 mg/dL — ABNORMAL HIGH (ref 0.44–1.00)
GFR, Estimated: 15 mL/min — ABNORMAL LOW (ref >60)
Glucose, Bld: 89 mg/dL (ref 70–99)
Potassium: 2.8 mmol/L — ABNORMAL LOW (ref 3.5–5.1)
Sodium: 135 mmol/L (ref 135–145)

## 2023-05-15 MED ORDER — POTASSIUM CHLORIDE CRYS ER 20 MEQ PO TBCR: 20.0000 meq | EXTENDED_RELEASE_TABLET | Freq: Two times a day (BID) | ORAL | 0 refills | Status: DC

## 2023-05-15 NOTE — Patient Instructions (Signed)
Your procedure is scheduled on: Thursday, August 22 Report to the Registration Desk on the 1st floor of the [facility] at 8 am To find out your arrival time, please call [phone] between 1PM - 3PM on: Wednesday, August 21  REMEMBER: Instructions that are not followed completely may result in serious medical risk, up to and including death; or upon the discretion of your surgeon and anesthesiologist your surgery may need to be rescheduled.  Do not eat food after midnight the night before surgery.  No gum chewing or hard candies.  One week prior to surgery: starting today, August 20 Stop Anti-inflammatories (NSAIDS) such as Advil, Aleve, Ibuprofen, Motrin, Naproxen, Naprosyn and Aspirin based products such as Excedrin, Goody's Powder, BC Powder. Stop ANY OVER THE COUNTER supplements until after surgery. You may however, continue to take Tylenol if needed for pain up until the day of surgery.  Continue taking all prescribed medications   Only take 1/2 of your regular dose of Lantus the night before surgery. Only take 33 units the night before surgery.  TAKE ONLY THESE MEDICATIONS THE MORNING OF SURGERY WITH A SIP OF WATER:  Atorvastatin Gabapentin Metoprolol  Do NOT take any insulin on the morning of surgery.  No Alcohol for 24 hours before or after surgery.  No Smoking including e-cigarettes for 24 hours before surgery.  No chewable tobacco products for at least 6 hours before surgery.  No nicotine patches on the day of surgery.  Do not use any "recreational" drugs for at least a week (preferably 2 weeks) before your surgery.  Please be advised that the combination of cocaine and anesthesia may have negative outcomes, up to and including death. If you test positive for cocaine, your surgery will be cancelled.  On the morning of surgery brush your teeth with toothpaste and water, you may rinse your mouth with mouthwash if you wish. Do not swallow any toothpaste or  mouthwash.  Use CHG Soap as directed on instruction sheet.  Do not wear jewelry, make-up, hairpins, clips or nail polish.  Do not wear lotions, powders, or perfumes.   Do not shave body hair from the neck down 48 hours before surgery.  Contact lenses, hearing aids and dentures may not be worn into surgery.  Do not bring valuables to the hospital. [facility] is not responsible for any missing/lost belongings or valuables.   Notify your doctor if there is any change in your medical condition (cold, fever, infection).  Wear comfortable clothing (specific to your surgery type) to the hospital.  After surgery, you can help prevent lung complications by doing breathing exercises.  Take deep breaths and cough every 1-2 hours.   If you are being discharged the day of surgery, you will not be allowed to drive home. You will need a responsible individual to drive you home and stay with you for 24 hours after surgery.   If you are taking public transportation, you will need to have a responsible individual with you.  Please call the Pre-admissions Testing Dept. at [phone] if you have any questions about these instructions.  Surgery Visitation Policy:  Patients having surgery or a procedure may have two visitors.  Children under the age of 80 must have an adult with them who is not the patient.     Preparing for Surgery with CHLORHEXIDINE GLUCONATE (CHG) Soap  Chlorhexidine Gluconate (CHG) Soap  o An antiseptic cleaner that kills germs and bonds with the skin to continue killing germs even  after washing  o Used for showering the night before surgery and morning of surgery  Before surgery, you can play an important role by reducing the number of germs on your skin.  CHG (Chlorhexidine gluconate) soap is an antiseptic cleanser which kills germs and bonds with the skin to continue killing germs even after washing.  Please do not use if you have an allergy to CHG or  antibacterial soaps. If your skin becomes reddened/irritated stop using the CHG.  1. Shower the NIGHT BEFORE SURGERY and the MORNING OF SURGERY with CHG soap.  2. If you choose to wash your hair, wash your hair first as usual with your normal shampoo.  3. After shampooing, rinse your hair and body thoroughly to remove the shampoo.  4. Use CHG as you would any other liquid soap. You can apply CHG directly to the skin and wash gently with a scrungie or a clean washcloth.  5. Apply the CHG soap to your body only from the neck down. Do not use on open wounds or open sores. Avoid contact with your eyes, ears, mouth, and genitals (private parts). Wash face and genitals (private parts) with your normal soap.  6. Wash thoroughly, paying special attention to the area where your surgery will be performed.  7. Thoroughly rinse your body with warm water.  8. Do not shower/wash with your normal soap after using and rinsing off the CHG soap.  9. Pat yourself dry with a clean towel.  10. Wear clean pajamas to bed the night before surgery.  12. Place clean sheets on your bed the night of your first shower and do not sleep with pets.  13. Shower again with the CHG soap on the day of surgery prior to arriving at the hospital.  14. Do not apply any deodorants/lotions/powders.  15. Please wear clean clothes to the hospital.

## 2023-05-15 NOTE — Progress Notes (Signed)
Called patient to verify if she had dialysis today and if her K+ level was checked. Patient stated that she did go to dialysis this am and took 5 pills there (1 green one and 4 orange ones). She didn't know what the pills were for. Had labs drawn at the pre-admit testing visit at 1:30 pm today; K+ level was 2.8. Quentin Mulling, NP notified Sheppard Plumber, NP and the plan is that Vivia Birmingham was going to call in a K+ Medication RX to the patient's pharmacy, [facility] [address] Patient was called back to let her know that she will need to have the medication picked up and to take it today. Patient acknowledged understanding.

## 2023-05-17 ENCOUNTER — Ambulatory Visit (HOSPITAL_BASED_OUTPATIENT_CLINIC_OR_DEPARTMENT_OTHER)
Admission: RE | Admit: 2023-05-17 | Discharge: 2023-05-17 | Disposition: A | Discharge status: Home / Self Care | Payer: Medicare HMO | Source: Home / Self Care | Attending: Vascular Surgery | Admitting: Vascular Surgery

## 2023-05-17 ENCOUNTER — Ambulatory Visit: Payer: Medicare HMO | Admitting: Urgent Care

## 2023-05-17 ENCOUNTER — Observation Stay
Admission: EM | Admit: 2023-05-17 | Discharge: 2023-05-20 | Disposition: A | Discharge status: Home / Self Care | Payer: Medicare HMO | Source: Home / Self Care | Attending: Emergency Medicine | Admitting: Emergency Medicine

## 2023-05-17 ENCOUNTER — Encounter: Payer: Self-pay | Admitting: Emergency Medicine

## 2023-05-17 ENCOUNTER — Other Ambulatory Visit: Payer: Self-pay

## 2023-05-17 ENCOUNTER — Encounter: Payer: Self-pay | Admitting: Vascular Surgery

## 2023-05-17 ENCOUNTER — Ambulatory Visit: Payer: Medicare HMO

## 2023-05-17 ENCOUNTER — Emergency Department: Payer: Medicare HMO

## 2023-05-17 ENCOUNTER — Encounter
Admission: RE | Disposition: A | Discharge status: Home / Self Care | Payer: Self-pay | Source: Home / Self Care | Attending: Vascular Surgery

## 2023-05-17 DIAGNOSIS — I2510 Atherosclerotic heart disease of native coronary artery without angina pectoris: Secondary | ICD-10-CM | POA: Insufficient documentation

## 2023-05-17 DIAGNOSIS — I639 Cerebral infarction, unspecified: Secondary | ICD-10-CM | POA: Diagnosis present

## 2023-05-17 DIAGNOSIS — E1151 Type 2 diabetes mellitus with diabetic peripheral angiopathy without gangrene: Secondary | ICD-10-CM | POA: Insufficient documentation

## 2023-05-17 DIAGNOSIS — M79601 Pain in right arm: Secondary | ICD-10-CM

## 2023-05-17 DIAGNOSIS — E1122 Type 2 diabetes mellitus with diabetic chronic kidney disease: Secondary | ICD-10-CM | POA: Insufficient documentation

## 2023-05-17 DIAGNOSIS — Z7982 Long term (current) use of aspirin: Secondary | ICD-10-CM | POA: Insufficient documentation

## 2023-05-17 DIAGNOSIS — I10 Essential (primary) hypertension: Secondary | ICD-10-CM | POA: Diagnosis not present

## 2023-05-17 DIAGNOSIS — Z992 Dependence on renal dialysis: Secondary | ICD-10-CM | POA: Insufficient documentation

## 2023-05-17 DIAGNOSIS — Z955 Presence of coronary angioplasty implant and graft: Secondary | ICD-10-CM | POA: Insufficient documentation

## 2023-05-17 DIAGNOSIS — E1129 Type 2 diabetes mellitus with other diabetic kidney complication: Secondary | ICD-10-CM | POA: Diagnosis present

## 2023-05-17 DIAGNOSIS — W050XXA Fall from non-moving wheelchair, initial encounter: Secondary | ICD-10-CM | POA: Diagnosis not present

## 2023-05-17 DIAGNOSIS — K219 Gastro-esophageal reflux disease without esophagitis: Secondary | ICD-10-CM | POA: Insufficient documentation

## 2023-05-17 DIAGNOSIS — Z79899 Other long term (current) drug therapy: Secondary | ICD-10-CM | POA: Diagnosis not present

## 2023-05-17 DIAGNOSIS — R200 Anesthesia of skin: Secondary | ICD-10-CM | POA: Insufficient documentation

## 2023-05-17 DIAGNOSIS — Z8781 Personal history of (healed) traumatic fracture: Secondary | ICD-10-CM

## 2023-05-17 DIAGNOSIS — I5040 Unspecified combined systolic (congestive) and diastolic (congestive) heart failure: Secondary | ICD-10-CM | POA: Insufficient documentation

## 2023-05-17 DIAGNOSIS — Z8673 Personal history of transient ischemic attack (TIA), and cerebral infarction without residual deficits: Secondary | ICD-10-CM | POA: Insufficient documentation

## 2023-05-17 DIAGNOSIS — I132 Hypertensive heart and chronic kidney disease with heart failure and with stage 5 chronic kidney disease, or end stage renal disease: Secondary | ICD-10-CM | POA: Insufficient documentation

## 2023-05-17 DIAGNOSIS — M25561 Pain in right knee: Secondary | ICD-10-CM | POA: Insufficient documentation

## 2023-05-17 DIAGNOSIS — N186 End stage renal disease: Secondary | ICD-10-CM

## 2023-05-17 DIAGNOSIS — M542 Cervicalgia: Principal | ICD-10-CM | POA: Insufficient documentation

## 2023-05-17 DIAGNOSIS — N2581 Secondary hyperparathyroidism of renal origin: Secondary | ICD-10-CM | POA: Insufficient documentation

## 2023-05-17 DIAGNOSIS — W19XXXA Unspecified fall, initial encounter: Secondary | ICD-10-CM | POA: Diagnosis not present

## 2023-05-17 DIAGNOSIS — E785 Hyperlipidemia, unspecified: Secondary | ICD-10-CM | POA: Insufficient documentation

## 2023-05-17 DIAGNOSIS — E119 Type 2 diabetes mellitus without complications: Secondary | ICD-10-CM

## 2023-05-17 DIAGNOSIS — Z794 Long term (current) use of insulin: Secondary | ICD-10-CM | POA: Insufficient documentation

## 2023-05-17 DIAGNOSIS — S12201A Unspecified nondisplaced fracture of third cervical vertebra, initial encounter for closed fracture: Secondary | ICD-10-CM | POA: Diagnosis present

## 2023-05-17 DIAGNOSIS — Z01812 Encounter for preprocedural laboratory examination: Secondary | ICD-10-CM

## 2023-05-17 DIAGNOSIS — D631 Anemia in chronic kidney disease: Secondary | ICD-10-CM | POA: Diagnosis not present

## 2023-05-17 DIAGNOSIS — S12201D Unspecified nondisplaced fracture of third cervical vertebra, subsequent encounter for fracture with routine healing: Secondary | ICD-10-CM | POA: Insufficient documentation

## 2023-05-17 DIAGNOSIS — D72829 Elevated white blood cell count, unspecified: Secondary | ICD-10-CM

## 2023-05-17 DIAGNOSIS — M25562 Pain in left knee: Secondary | ICD-10-CM | POA: Insufficient documentation

## 2023-05-17 HISTORY — PX: AV FISTULA PLACEMENT: SHX1204

## 2023-05-17 LAB — CBC WITH DIFFERENTIAL/PLATELET
Abs Immature Granulocytes: 0.06 10*3/uL (ref 0.00–0.07)
Basophils Absolute: 0.1 10*3/uL (ref 0.0–0.1)
Basophils Relative: 1 %
Eosinophils Absolute: 0.2 10*3/uL (ref 0.0–0.5)
Eosinophils Relative: 2 %
HCT: 35.6 % — ABNORMAL LOW (ref 36.0–46.0)
Hemoglobin: 11.6 g/dL — ABNORMAL LOW (ref 12.0–15.0)
Immature Granulocytes: 1 %
Lymphocytes Relative: 15 %
Lymphs Abs: 1.6 10*3/uL (ref 0.7–4.0)
MCH: 28.1 pg (ref 26.0–34.0)
MCHC: 32.6 g/dL (ref 30.0–36.0)
MCV: 86.2 fL (ref 80.0–100.0)
Monocytes Absolute: 0.7 10*3/uL (ref 0.1–1.0)
Monocytes Relative: 6 %
Neutro Abs: 8.3 10*3/uL — ABNORMAL HIGH (ref 1.7–7.7)
Neutrophils Relative %: 75 %
Platelets: 261 10*3/uL (ref 150–400)
RBC: 4.13 MIL/uL (ref 3.87–5.11)
RDW: 15.2 % (ref 11.5–15.5)
WBC: 10.9 10*3/uL — ABNORMAL HIGH (ref 4.0–10.5)
nRBC: 0 % (ref 0.0–0.2)

## 2023-05-17 LAB — POCT I-STAT, CHEM 8
BUN: 30 mg/dL — ABNORMAL HIGH (ref 8–23)
Calcium, Ion: 1.15 mmol/L (ref 1.15–1.40)
Chloride: 99 mmol/L (ref 98–111)
Creatinine, Ser: 6.1 mg/dL — ABNORMAL HIGH (ref 0.44–1.00)
Glucose, Bld: 89 mg/dL (ref 70–99)
HCT: 42 % (ref 36.0–46.0)
Hemoglobin: 14.3 g/dL (ref 12.0–15.0)
Potassium: 3.6 mmol/L (ref 3.5–5.1)
Sodium: 138 mmol/L (ref 135–145)
TCO2: 30 mmol/L (ref 22–32)

## 2023-05-17 LAB — BASIC METABOLIC PANEL
Anion gap: 15 (ref 5–15)
BUN: 35 mg/dL — ABNORMAL HIGH (ref 8–23)
CO2: 26 mmol/L (ref 22–32)
Calcium: 9.2 mg/dL (ref 8.9–10.3)
Chloride: 95 mmol/L — ABNORMAL LOW (ref 98–111)
Creatinine, Ser: 6.23 mg/dL — ABNORMAL HIGH (ref 0.44–1.00)
GFR, Estimated: 7 mL/min — ABNORMAL LOW (ref >60)
Glucose, Bld: 77 mg/dL (ref 70–99)
Potassium: 4.1 mmol/L (ref 3.5–5.1)
Sodium: 136 mmol/L (ref 135–145)

## 2023-05-17 LAB — GLUCOSE, CAPILLARY: Glucose-Capillary: 80 mg/dL (ref 70–99)

## 2023-05-17 LAB — CBG MONITORING, ED: Glucose-Capillary: 115 mg/dL — ABNORMAL HIGH (ref 70–99)

## 2023-05-17 SURGERY — INSERTION OF ARTERIOVENOUS (AV) GORE-TEX GRAFT ARM
Anesthesia: General | Site: Arm Lower | Laterality: Right

## 2023-05-17 MED ORDER — FERROUS SULFATE 325 (65 FE) MG PO TABS
325.0000 mg | ORAL_TABLET | Freq: Every day | ORAL | Status: DC
  Administered 2023-05-18 – 2023-05-20 (×2): 325 mg via ORAL
  Filled 2023-05-17 (×2): qty 1

## 2023-05-17 MED ORDER — FENTANYL CITRATE (PF) 100 MCG/2ML IJ SOLN: 25.0000 ug | INTRAMUSCULAR | Status: DC | PRN

## 2023-05-17 MED ORDER — HEPARIN SODIUM (PORCINE) 1000 UNIT/ML IJ SOLN
INTRAMUSCULAR | Status: DC | PRN
  Administered 2023-05-17: 3000 [IU] via INTRAVENOUS

## 2023-05-17 MED ORDER — ONDANSETRON 4 MG PO TBDP
4.0000 mg | ORAL_TABLET | Freq: Once | ORAL | Status: AC
  Administered 2023-05-17: 4 mg via ORAL
  Filled 2023-05-17: qty 1

## 2023-05-17 MED ORDER — CHLORHEXIDINE GLUCONATE 0.12 % MT SOLN
OROMUCOSAL | Status: AC
  Filled 2023-05-17: qty 15

## 2023-05-17 MED ORDER — FENTANYL CITRATE PF 50 MCG/ML IJ SOSY
50.0000 ug | PREFILLED_SYRINGE | Freq: Once | INTRAMUSCULAR | Status: AC
  Administered 2023-05-17: 50 ug via INTRAVENOUS

## 2023-05-17 MED ORDER — MIDAZOLAM HCL 2 MG/2ML IJ SOLN
1.0000 mg | INTRAMUSCULAR | Status: DC | PRN
  Administered 2023-05-17: 1 mg via INTRAVENOUS

## 2023-05-17 MED ORDER — ONDANSETRON HCL 4 MG/2ML IJ SOLN
INTRAMUSCULAR | Status: DC | PRN
  Administered 2023-05-17: 4 mg via INTRAVENOUS

## 2023-05-17 MED ORDER — HYDRALAZINE HCL 20 MG/ML IJ SOLN: 5.0000 mg | INTRAMUSCULAR | Status: DC | PRN

## 2023-05-17 MED ORDER — DEXMEDETOMIDINE HCL IN NACL 80 MCG/20ML IV SOLN
INTRAVENOUS | Status: DC | PRN
  Administered 2023-05-17 (×5): 4 ug via INTRAVENOUS

## 2023-05-17 MED ORDER — SODIUM CHLORIDE 0.9 % IV SOLN
INTRAVENOUS | Status: DC | PRN
  Administered 2023-05-17: 501 mL

## 2023-05-17 MED ORDER — PROPOFOL 10 MG/ML IV BOLUS
INTRAVENOUS | Status: DC | PRN
  Administered 2023-05-17: 40 mg via INTRAVENOUS
  Administered 2023-05-17: 10 mg via INTRAVENOUS

## 2023-05-17 MED ORDER — ONDANSETRON HCL 4 MG/2ML IJ SOLN
4.0000 mg | Freq: Once | INTRAMUSCULAR | Status: DC
  Filled 2023-05-17: qty 2

## 2023-05-17 MED ORDER — GLYCOPYRROLATE 0.2 MG/ML IJ SOLN
INTRAMUSCULAR | Status: DC | PRN
  Administered 2023-05-17: .2 mg via INTRAVENOUS

## 2023-05-17 MED ORDER — CHLORHEXIDINE GLUCONATE 0.12 % MT SOLN
15.0000 mL | Freq: Once | OROMUCOSAL | Status: AC
  Administered 2023-05-17: 15 mL via OROMUCOSAL

## 2023-05-17 MED ORDER — KETAMINE HCL 10 MG/ML IJ SOLN
INTRAMUSCULAR | Status: DC | PRN
  Administered 2023-05-17 (×3): 10 mg via INTRAVENOUS

## 2023-05-17 MED ORDER — FENTANYL CITRATE PF 50 MCG/ML IJ SOSY
PREFILLED_SYRINGE | INTRAMUSCULAR | Status: AC
  Filled 2023-05-17: qty 1

## 2023-05-17 MED ORDER — INSULIN ASPART 100 UNIT/ML IJ SOLN
0.0000 [IU] | Freq: Three times a day (TID) | INTRAMUSCULAR | Status: DC
  Administered 2023-05-18: 1 [IU] via SUBCUTANEOUS
  Administered 2023-05-19: 2 [IU] via SUBCUTANEOUS
  Administered 2023-05-20: 1 [IU] via SUBCUTANEOUS
  Filled 2023-05-17 (×3): qty 1

## 2023-05-17 MED ORDER — OXYCODONE-ACETAMINOPHEN 5-325 MG PO TABS: 1.0000 | ORAL_TABLET | ORAL | 0 refills | Status: DC | PRN

## 2023-05-17 MED ORDER — CHLORHEXIDINE GLUCONATE CLOTH 2 % EX PADS
6.0000 | MEDICATED_PAD | Freq: Once | CUTANEOUS | Status: AC
  Administered 2023-05-17: 6 via TOPICAL

## 2023-05-17 MED ORDER — ATORVASTATIN CALCIUM 20 MG PO TABS
40.0000 mg | ORAL_TABLET | Freq: Every day | ORAL | Status: DC
  Administered 2023-05-19 – 2023-05-20 (×2): 40 mg via ORAL
  Filled 2023-05-17 (×2): qty 2

## 2023-05-17 MED ORDER — OXYCODONE-ACETAMINOPHEN 5-325 MG PO TABS
1.0000 | ORAL_TABLET | ORAL | Status: DC | PRN
  Administered 2023-05-19 – 2023-05-20 (×2): 1 via ORAL
  Filled 2023-05-17 (×3): qty 1

## 2023-05-17 MED ORDER — GABAPENTIN 300 MG PO CAPS
300.0000 mg | ORAL_CAPSULE | Freq: Three times a day (TID) | ORAL | Status: DC
  Administered 2023-05-18 – 2023-05-20 (×5): 300 mg via ORAL
  Filled 2023-05-17 (×7): qty 1

## 2023-05-17 MED ORDER — OXYCODONE-ACETAMINOPHEN 5-325 MG PO TABS
1.0000 | ORAL_TABLET | Freq: Once | ORAL | Status: AC
  Administered 2023-05-17: 1 via ORAL
  Filled 2023-05-17: qty 1

## 2023-05-17 MED ORDER — VANCOMYCIN HCL IN DEXTROSE 1-5 GM/200ML-% IV SOLN
INTRAVENOUS | Status: AC
  Filled 2023-05-17: qty 200

## 2023-05-17 MED ORDER — FAMOTIDINE 20 MG PO TABS
ORAL_TABLET | ORAL | Status: AC
  Filled 2023-05-17: qty 1

## 2023-05-17 MED ORDER — "VISTASEAL 4 ML SINGLE DOSE KIT "
PACK | CUTANEOUS | Status: DC | PRN
  Administered 2023-05-17: 4 mL via TOPICAL

## 2023-05-17 MED ORDER — "VISTASEAL 4 ML SINGLE DOSE KIT "
PACK | CUTANEOUS | Status: AC
  Filled 2023-05-17: qty 4

## 2023-05-17 MED ORDER — SODIUM CHLORIDE 0.9 % IV SOLN: INTRAVENOUS | Status: DC

## 2023-05-17 MED ORDER — PROPOFOL 500 MG/50ML IV EMUL
INTRAVENOUS | Status: DC | PRN
  Administered 2023-05-17: 50 ug/kg/min via INTRAVENOUS

## 2023-05-17 MED ORDER — METOPROLOL TARTRATE 25 MG PO TABS: 25.0000 mg | ORAL_TABLET | Freq: Two times a day (BID) | ORAL | Status: DC

## 2023-05-17 MED ORDER — BUPIVACAINE HCL (PF) 0.5 % IJ SOLN
INTRAMUSCULAR | Status: DC | PRN
Start: 2023-05-17 — End: 2023-05-17
  Administered 2023-05-17 (×2): 10 mL via PERINEURAL

## 2023-05-17 MED ORDER — LIDOCAINE HCL (PF) 1 % IJ SOLN
INTRAMUSCULAR | Status: AC
  Filled 2023-05-17: qty 5

## 2023-05-17 MED ORDER — FAMOTIDINE 20 MG PO TABS
20.0000 mg | ORAL_TABLET | Freq: Once | ORAL | Status: AC
  Administered 2023-05-17: 20 mg via ORAL

## 2023-05-17 MED ORDER — MORPHINE SULFATE (PF) 2 MG/ML IV SOLN: 2.0000 mg | INTRAVENOUS | Status: DC | PRN

## 2023-05-17 MED ORDER — KETAMINE HCL 50 MG/5ML IJ SOSY
PREFILLED_SYRINGE | INTRAMUSCULAR | Status: AC
  Filled 2023-05-17: qty 5

## 2023-05-17 MED ORDER — METOPROLOL TARTRATE 25 MG PO TABS
25.0000 mg | ORAL_TABLET | Freq: Two times a day (BID) | ORAL | Status: DC
  Administered 2023-05-18 – 2023-05-20 (×4): 25 mg via ORAL
  Filled 2023-05-17 (×5): qty 1

## 2023-05-17 MED ORDER — HEPARIN SODIUM (PORCINE) 5000 UNIT/ML IJ SOLN
INTRAMUSCULAR | Status: AC
  Filled 2023-05-17: qty 1

## 2023-05-17 MED ORDER — VANCOMYCIN HCL IN DEXTROSE 1-5 GM/200ML-% IV SOLN
1000.0000 mg | INTRAVENOUS | Status: AC
  Administered 2023-05-17: 1000 mg via INTRAVENOUS

## 2023-05-17 MED ORDER — OXYCODONE HCL 5 MG PO TABS: 5.0000 mg | ORAL_TABLET | Freq: Once | ORAL | Status: DC | PRN

## 2023-05-17 MED ORDER — FUROSEMIDE 20 MG PO TABS
20.0000 mg | ORAL_TABLET | Freq: Every day | ORAL | Status: DC
  Administered 2023-05-19 – 2023-05-20 (×2): 20 mg via ORAL
  Filled 2023-05-17 (×2): qty 1

## 2023-05-17 MED ORDER — ACETAMINOPHEN 325 MG PO TABS: 650.0000 mg | ORAL_TABLET | Freq: Four times a day (QID) | ORAL | Status: DC | PRN

## 2023-05-17 MED ORDER — BUPIVACAINE HCL (PF) 0.5 % IJ SOLN
INTRAMUSCULAR | Status: AC
  Filled 2023-05-17: qty 20

## 2023-05-17 MED ORDER — INSULIN ASPART 100 UNIT/ML IJ SOLN: 0.0000 [IU] | Freq: Every day | INTRAMUSCULAR | Status: DC

## 2023-05-17 MED ORDER — ONDANSETRON HCL 4 MG/2ML IJ SOLN: 4.0000 mg | Freq: Three times a day (TID) | INTRAMUSCULAR | Status: DC | PRN

## 2023-05-17 MED ORDER — FENTANYL CITRATE PF 50 MCG/ML IJ SOSY
25.0000 ug | PREFILLED_SYRINGE | Freq: Once | INTRAMUSCULAR | Status: DC
  Filled 2023-05-17: qty 1

## 2023-05-17 MED ORDER — LIDOCAINE HCL (CARDIAC) PF 100 MG/5ML IV SOSY
PREFILLED_SYRINGE | INTRAVENOUS | Status: DC | PRN
Start: 2023-05-17 — End: 2023-05-17
  Administered 2023-05-17: 20 mg via INTRATRACHEAL

## 2023-05-17 MED ORDER — HEPARIN SODIUM (PORCINE) 5000 UNIT/ML IJ SOLN
5000.0000 [IU] | Freq: Three times a day (TID) | INTRAMUSCULAR | Status: DC
  Administered 2023-05-17 – 2023-05-20 (×8): 5000 [IU] via SUBCUTANEOUS
  Filled 2023-05-17 (×8): qty 1

## 2023-05-17 MED ORDER — MIDAZOLAM HCL 2 MG/2ML IJ SOLN
INTRAMUSCULAR | Status: AC
  Filled 2023-05-17: qty 2

## 2023-05-17 MED ORDER — HEMOSTATIC AGENTS (NO CHARGE) OPTIME
TOPICAL | Status: DC | PRN
Start: 2023-05-17 — End: 2023-05-17
  Administered 2023-05-17: 1 via TOPICAL

## 2023-05-17 MED ORDER — ORAL CARE MOUTH RINSE: 15.0000 mL | Freq: Once | OROMUCOSAL | Status: AC

## 2023-05-17 MED ORDER — CHLORHEXIDINE GLUCONATE CLOTH 2 % EX PADS: 6.0000 | MEDICATED_PAD | Freq: Once | CUTANEOUS | Status: DC

## 2023-05-17 MED ORDER — OXYCODONE HCL 5 MG/5ML PO SOLN: 5.0000 mg | Freq: Once | ORAL | Status: DC | PRN

## 2023-05-17 SURGICAL SUPPLY — 58 items
5 PAIRS OF YELLOW SUTURE CLAMP (MISCELLANEOUS) ×2
ADH SKN CLS APL DERMABOND .7 (GAUZE/BANDAGES/DRESSINGS) ×4
APL PRP STRL LF DISP 70% ISPRP (MISCELLANEOUS) ×2
BAG DECANTER FOR FLEXI CONT (MISCELLANEOUS) ×2 IMPLANT
BLADE SURG SZ11 CARB STEEL (BLADE) ×2 IMPLANT
BOOT SUTURE VASCULAR YLW (MISCELLANEOUS) ×2
BRUSH SCRUB EZ 4% CHG (MISCELLANEOUS) ×2 IMPLANT
CHLORAPREP W/TINT 26 (MISCELLANEOUS) ×2 IMPLANT
CLAMP SUTURE YELLOW 5 PAIRS (MISCELLANEOUS) ×2 IMPLANT
CLIP SPRNG 6 S-JAW DBL (CLIP) ×1 IMPLANT
CLIP SPRNG 6MM S-JAW DBL (CLIP) ×2
DERMABOND ADVANCED .7 DNX12 (GAUZE/BANDAGES/DRESSINGS) ×3 IMPLANT
DRESSING SURGICEL FIBRLLR 1X2 (HEMOSTASIS) ×2 IMPLANT
DRSG SURGICEL FIBRILLAR 1X2 (HEMOSTASIS) ×2
ELECT CAUTERY BLADE 6.4 (BLADE) ×2 IMPLANT
ELECT REM PT RETURN 9FT ADLT (ELECTROSURGICAL) ×2
ELECTRODE REM PT RTRN 9FT ADLT (ELECTROSURGICAL) ×2 IMPLANT
GEL ULTRASOUND 20GR AQUASONIC (MISCELLANEOUS) IMPLANT
GLOVE BIO SURGEON STRL SZ7 (GLOVE) ×4 IMPLANT
GOWN STRL REUS W/ TWL LRG LVL3 (GOWN DISPOSABLE) ×4 IMPLANT
GOWN STRL REUS W/ TWL XL LVL3 (GOWN DISPOSABLE) ×4 IMPLANT
GOWN STRL REUS W/TWL LRG LVL3 (GOWN DISPOSABLE) ×4
GOWN STRL REUS W/TWL XL LVL3 (GOWN DISPOSABLE) ×4
GRAFT PROPATEN STD WALL 6X40 (Vascular Products) ×1 IMPLANT
Goretex CV-6 Suture ×2 IMPLANT
IV NS 500ML (IV SOLUTION) ×2
IV NS 500ML BAXH (IV SOLUTION) ×2 IMPLANT
KIT TURNOVER KIT A (KITS) ×2 IMPLANT
LABEL OR SOLS (LABEL) ×2 IMPLANT
LOOP VESSEL MAXI 1X406 RED (MISCELLANEOUS) ×2 IMPLANT
LOOP VESSEL MINI 0.8X406 BLUE (MISCELLANEOUS) ×2 IMPLANT
MANIFOLD NEPTUNE II (INSTRUMENTS) ×2 IMPLANT
NDL FILTER BLUNT 18X1 1/2 (NEEDLE) ×1 IMPLANT
NEEDLE FILTER BLUNT 18X1 1/2 (NEEDLE) ×2 IMPLANT
NS IRRIG 500ML POUR BTL (IV SOLUTION) ×2 IMPLANT
PACK EXTREMITY ARMC (MISCELLANEOUS) ×2 IMPLANT
PAD PREP OB/GYN DISP 24X41 (PERSONAL CARE ITEMS) ×2 IMPLANT
SOLUTION CELL SAVER (CLIP) ×2 IMPLANT
STOCKINETTE 48X4 2 PLY STRL (GAUZE/BANDAGES/DRESSINGS) ×1 IMPLANT
STOCKINETTE STRL 4IN 9604848 (GAUZE/BANDAGES/DRESSINGS) ×2 IMPLANT
SUT GORETEX CV-6TTC-13 36IN (SUTURE) ×2 IMPLANT
SUT MNCRL AB 4-0 PS2 18 (SUTURE) ×2 IMPLANT
SUT PROLENE 6 0 BV (SUTURE) ×8 IMPLANT
SUT SILK 2 0 (SUTURE)
SUT SILK 2 0 SH (SUTURE) ×1 IMPLANT
SUT SILK 2-0 18XBRD TIE 12 (SUTURE) ×1 IMPLANT
SUT SILK 3 0 (SUTURE)
SUT SILK 3-0 18XBRD TIE 12 (SUTURE) ×1 IMPLANT
SUT SILK 4 0 (SUTURE)
SUT SILK 4-0 18XBRD TIE 12 (SUTURE) ×1 IMPLANT
SUT VIC AB 3-0 SH 27 (SUTURE) ×2
SUT VIC AB 3-0 SH 27X BRD (SUTURE) ×2 IMPLANT
SYR 20ML LL LF (SYRINGE) ×2 IMPLANT
SYR 3ML LL SCALE MARK (SYRINGE) ×2 IMPLANT
SYR TB 1ML LL NO SAFETY (SYRINGE) IMPLANT
TAG SUTURE CLAMP YLW 5PR (MISCELLANEOUS) ×2
TRAP FLUID SMOKE EVACUATOR (MISCELLANEOUS) ×2 IMPLANT
WATER STERILE IRR 500ML POUR (IV SOLUTION) ×2 IMPLANT

## 2023-05-17 NOTE — Progress Notes (Signed)
No thrill felt at this time.  Per Tami RN from [employer] she did have a palpable thrill in pacu that is not felt at this time and her bruit is less audible than prior.  Red line noted from distal to proximal incision.  Dr dew called and notified and these are expected r/t to pt vasculature and procedure.  Ok for discharge.

## 2023-05-17 NOTE — Progress Notes (Signed)
Pt was sent home with a sling on her right arm to prevent injury, pt has had a nerve block pre-surgery.

## 2023-05-17 NOTE — Op Note (Signed)
OPERATIVE NOTE   PROCEDURE:  Right forearm loop arteriovenous graft with  6 mm pTFE  PRE-OPERATIVE DIAGNOSIS: 1. ESRD   POST-OPERATIVE DIAGNOSIS: same as above  SURGEON: Festus Barren, MD  ASSISTANT(S): none  ANESTHESIA: general  ESTIMATED BLOOD LOSS: 15 cc  FINDING(S): Inadequate cephalic and basilic veins for AVF creation  SPECIMEN(S):  none  INDICATIONS:   Deanna [name] is a 72 y.o. female who  presents with ESRD and need for permanent dialysis access.  Risk, benefits, and alternatives to access surgery were discussed.  The difference between AV fistulas and AV grafts were discussed.  The patient is aware the risks include but are not limited to: bleeding, infection, steal syndrome, nerve damage, ischemic monomelic neuropathy, failure to mature, and need for additional procedures.  The patient is aware of the risks and elects to proceed forward.  DESCRIPTION: After full informed written consent was obtained from the patient, the patient was brought back to the operating room and placed supine upon the operating table.  The patientwas given IV antibiotics prior to proceeding.  After obtaining adequate general anesthesia, the patient was prepped and draped in standard fashion.    I made a transverse incision at the antecubital fossa and dissected down through the subcutaneous tissue and fascia. The cephalic vein and basilic vein were both inadequate for AVF creation. The brachial artery was dissected proximally and distally and vessel loops placed for control.  The artery was patent and adequate sized for AV graft creation.  One of the brachial veins was in close proximity and was found to be patent and of adequate size for AV graft creation.  I dissected it out and placed a vessel loop around the vein, and would later control this with bulldog clamps.  I then obtained a 6 mm Propaten graft, which I stretched to full length to help determine the apex of this looped forearm  arteriovenous graft.  I made a transverse incision at the determined apex site and dissected down out a pocket.  I then took a curved metal tunneler and dissected from the antecubital incision up to the apical incision.  I delivered the graft through the metal tunnel, taking care to maintain the orientation of the graft.  I then tunneled from the apex to the antecubital incision, leaving the metal tunnel in place and delivered the remainder of the graft through the tunnel.   I then gave the patient 3000 units of heparin to gain some anticoagulation, and allowed this to circulate for several minutes. I placed the brachial artery under tension proximally and distally with vessel loops.  I made an arteriotomy with a 11 blade and extended it with a Potts scissor for.  The graft was then cut and beveled to match the arteriotomy.  The graft was sewn to the artery with a running CV-6 Goretex suture, in a end-to-side configuration.  Prior to completing the anastomosis, I allowed the artery to backbleed from both ends.  I then  released the vessel loops on the inflow and allowed the artery to decompress through the graft. There was good pulsatile bleeding through this graft.  I then clamped the graft near its arterial anastomosis.  I then suctioned out all the blood in the graft and instilled heparinized saline into the graft.   At this point, I pulled the graft to appropriate tension to remove any redundancy.  I then used the bulldog clamps to control the vein.  An anterior wall venotomy was then made with  an 11 blade and extended with Potts scissors.  The graft was then cut an beveled to match the venotomy.  The venous anastomosis was created with a CV-6 suture. Prior to completing this anastomosis, I allowed the vein to back bleed and then I also allowed the artery to bleed in an antegrade fashion.  I completed this anastomosis in the usual fashion, and irrigated out the wound with sterile saline.  I released all clamps.   There was excellent flow in the graft, and a palpable pulse in the artery beyond the graft. At this point, I washed out the antecubital wound.  I placed Surgicel and Evicel topical hemostatic agents and hemostasis was complete. The subcutaneous tissue was reapproximated in all incisions with a running stitch of 3-0 Vicryl.  The skin was then reapproximated in all incisions with a running subcuticular 4-0 Monocryl.  The skin was then cleaned and dried at all incisions, and Dermabond used to reinforce the skin closure.  The patient was taken to the recovery room in stable condition having tolerated the procedure well.  COMPLICATIONS: none  CONDITION: stable  Festus Barren  05/17/2023, 12:32 PM                  This note was created with Dragon Medical transcription system. Any errors in dictation are purely unintentional.
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
## 2023-05-17 NOTE — H&P (Addendum)
History and Physical    Lokelani Fasick RUE:[id number] DOB: [birth year] DOA: 05/17/2023  Referring MD/NP/PA:   PCP: [facility]   Patient coming from:  The patient is coming from home.     Chief Complaint: fall and neck pain  HPI: Deanna [name] is a 72 y.o. female with medical history significant of ESRD-HD (TTS), HTN, H LD, DM, CAD, sCHF with EF 30%, stroke, anemia, recent C3 cervical fracture on C-collar, angioedema due to ACEI, who presents with fall and neck pain.   Pt states that she had right arm AVF placement by Dr. Wyn Quaker of [facility] today. She was on her way home, when the wheelchair fell over. She states she landed on her R side and since then has had ongoing and worsening neck pain, R shoulder pain, and b/l knee pain.  Her neck pain is constant, severe, radiating to bilateral shoulders, aggravated by movement.  She has right arm numbness which is likely from the anesthesia block during the surgery.  Patient does not have chest pain or shortness of breath.  She has mild dry cough.  No nausea, vomiting, diarrhea or abdominal pain.  No symptoms of UTI.  Her last dialysis was on Tuesday.  She is supposed to do dialysis tomorrow.  Data reviewed independently and ED Course: pt was found to have WBC 10.9, potassium 4.1, bicarbonate 26, creatinine 6.23, BUN 35, temperature normal, blood pressure 95/54, 131/66, heart rate 88, RR 26 --> 18, oxygen saturation 100% on room air.  CT of head is negative.  X-ray negative for bony fracture including bilateral knee, right rib, right shoulder.  Patient is placed on telemetry bed for observation.   CT-C spin: Again noted are the large flowing anterior osteophytes compatible with diffuse idiopathic skeletal hyperostosis. Fracture again seen through the anterior bridging osteophyte at C3-4. Fracture line slightly more evident on today's study compatible with interval bone resorption. No new fracture or malalignment.  MRI-C  spin: 1. Slightly increased edema along the known C3-C4 osteophyte fracture line, which is not unexpected in the setting of interval healing. No evidence of ligamentous injury. 2. Otherwise unchanged degenerative findings.    EKG: I have personally reviewed.  Sinus rhythm, QTc 469, early IV progression, nonspecific T wave change.  Review of Systems:   General: no fevers, chills, no body weight gain, has fatigue HEENT: no blurry vision, hearing changes or sore throat Respiratory: no dyspnea, has coughing, no wheezing CV: no chest pain, no palpitations GI: no nausea, vomiting, abdominal pain, diarrhea, constipation GU: no dysuria, burning on urination, increased urinary frequency, hematuria  Ext: no leg edema Neuro: no unilateral weakness, no vision change or hearing loss. Has fall. Skin: no rash, no skin tear. MSK: has neck pain, R shoulder pain, and b/l knee pain. Heme: No easy bruising.  Travel history: No recent long distant travel.   Allergy:  Allergies  Allergen Reactions   Ace Inhibitors Swelling   Entresto [Sacubitril-Valsartan] Swelling    Patient said she took both lisinopril and Entresto so it is not clear which one she actually reacted to.  For safety reasons, she has been advised to avoid both ACE inhibitors and Entresto.   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   required intubation   Anemia    C3 cervical fracture (HCC) 04/2023   CHF (congestive heart  failure) (HCC)    combined systolic and diastolic   Coronary artery disease    ESRD on dialysis (HCC)    T-Th-Sat   GERD (gastroesophageal reflux disease)    Hyperlipidemia    Hypertension    Pneumonia    Secondary hyperparathyroidism of renal origin (HCC)    Stroke (HCC)    with right sided weakness   Type 2 diabetes mellitus with diabetic  neuropathy [facility])     Past Surgical History:  Procedure Laterality Date   AV FISTULA PLACEMENT Right 05/17/2023   Procedure: INSERTION OF ARTERIOVENOUS (AV) GORE-TEX GRAFT ARM;  Surgeon: Annice Needy, MD;  Location: [facility] ORS;  Service: Vascular;  Laterality: Right;   CARDIAC CATHETERIZATION Left 03/01/2016   Procedure: Left Heart Cath and Coronary Angiography;  Surgeon: Dalia Heading, MD;  Location: [facility] INVASIVE CV LAB;  Service: Cardiovascular;  Laterality: Left;   CESAREAN SECTION     DIALYSIS/PERMA CATHETER INSERTION Right 01/10/2022   Procedure: DIALYSIS/PERMA CATHETER INSERTION;  Surgeon: Bertram Denver, MD;  Location: [facility] INVASIVE CV LAB;  Service: Cardiovascular;  Laterality: Right;   DIALYSIS/PERMA CATHETER INSERTION N/A 04/12/2022   Procedure: DIALYSIS/PERMA CATHETER INSERTION;  Surgeon: Annice Needy, MD;  Location: [facility] INVASIVE CV LAB;  Service: Cardiovascular;  Laterality: N/A;   INTUBATION-ENDOTRACHEAL WITH TRACHEOSTOMY STANDBY Left 01/14/2022   Procedure: INTUBATION-NASAL WITH TRACHEOSTOMY STANDBY;  Surgeon: Linus Salmons, MD;  Location: [facility] ORS;  Service: ENT;  Laterality: Left;   TEMPORARY DIALYSIS CATHETER N/A 01/04/2022   Procedure: TEMPORARY DIALYSIS CATHETER;  Surgeon: Annice Needy, MD;  Location: [facility] INVASIVE CV LAB;  Service: Cardiovascular;  Laterality: N/A;    Social History:  reports that she has never smoked. She has never used smokeless tobacco. She reports that she does not drink alcohol and does not use drugs.  Family History:  Family History  Problem Relation Age of Onset   Osteoarthritis Brother      Prior to Admission medications   Medication Sig Start Date End Date Taking? Authorizing Provider  aspirin EC 81 MG tablet Take 81 mg by mouth daily.    [provider]  atorvastatin (LIPITOR) 40 MG tablet Take 40 mg by mouth daily.    [provider]  ferrous sulfate 325 (65 FE) MG EC tablet Take 325 mg by mouth in the morning.  08/16/21 05/15/23  [provider]  furosemide (LASIX) 20 MG tablet Take 20 mg by mouth daily. 10/28/21   [provider]  gabapentin (NEURONTIN) 300 MG capsule Take 300 mg by mouth 3 (three) times daily. 08/24/21   [provider]  insulin aspart (NOVOLOG) 100 UNIT/ML injection Inject 5 Units into the skin 3 (three) times daily before meals. 5 units Brk 5 units noon 5 units hs 01/27/22   Charise Killian, MD  insulin glargine (LANTUS) 100 UNIT/ML injection Inject 65 Units into the skin at bedtime as needed.    [provider]  metoprolol tartrate (LOPRESSOR) 25 MG tablet Take 1 tablet (25 mg total) by mouth 2 (two) times daily. 01/13/22 05/17/23  Leatha Gilding, MD  oxyCODONE-acetaminophen (PERCOCET) 5-325 MG tablet Take 1 tablet by mouth every 4 (four) hours as needed for severe pain. 05/17/23 05/16/24  Annice Needy, MD  potassium chloride SA (KLOR-CON M) 20 MEQ tablet Take 1 tablet (20 mEq total) by mouth 2 (two) times daily. 05/15/23   Georgiana Spinner, NP  Vitamin D, Ergocalciferol, (DRISDOL) 1.25 MG (50000 UNIT) CAPS capsule Take 50,000 Units  by mouth once a week. Wednesday 10/12/21   [provider]    Physical Exam: Vitals:   05/18/23 0015 05/18/23 0030 05/18/23 0045 05/18/23 0100  BP: (!) 104/52 131/71    Pulse: 82 85 79 78  Resp:  14 (!) 23 17  Temp:      TempSrc:      SpO2: 92% 100% 100% 98%  Weight:      Height:       General: Not in acute distress HEENT:       Eyes: PERRL, EOMI, no jaundice       ENT: No discharge from the ears and nose, no pharynx injection, no tonsillar enlargement.        Neck: No JVD, no bruit, no mass felt. Heme: No neck lymph node enlargement. Cardiac: S1/S2, RRR, No murmurs, No gallops or rubs. Respiratory: No rales, wheezing, rhonchi or rubs. GI: Soft, nondistended, nontender, no rebound pain, no organomegaly, BS present. GU: No hematuria Ext: No pitting leg edema bilaterally. 1+DP/PT pulse  bilaterally. Musculoskeletal: has neck pain, R shoulder pain, and b/l knee pain. Skin: No rashes.  Neuro: Alert, oriented X3, cranial nerves II-XII grossly intact, moves all extremities Psych: Patient is not psychotic, no suicidal or hemocidal ideation.  Labs on Admission: I have personally reviewed following labs and imaging studies  CBC: Recent Labs  Lab 05/15/23 1340 05/17/23 0825 05/17/23 1850  WBC 11.1*  --  10.9*  NEUTROABS 8.4*  --  8.3*  HGB 11.7* 14.3 11.6*  HCT 35.6* 42.0 35.6*  MCV 84.8  --  86.2  PLT 286  --  261   Basic Metabolic Panel: Recent Labs  Lab 05/15/23 1340 05/17/23 0825 05/17/23 1850  NA 135 138 136  K 2.8* 3.6 4.1  CL 93* 99 95*  CO2 28  --  26  GLUCOSE 89 89 77  BUN 13 30* 35*  CREATININE 3.21* 6.10* 6.23*  CALCIUM 8.9  --  9.2   GFR: Estimated Creatinine Clearance: 10 mL/min (A) (by C-G formula based on SCr of 6.23 mg/dL (H)). Liver Function Tests: No results for input(s): "AST", "ALT", "ALKPHOS", "BILITOT", "PROT", "ALBUMIN" in the last 168 hours. No results for input(s): "LIPASE", "AMYLASE" in the last 168 hours. No results for input(s): "AMMONIA" in the last 168 hours. Coagulation Profile: No results for input(s): "INR", "PROTIME" in the last 168 hours. Cardiac Enzymes: No results for input(s): "CKTOTAL", "CKMB", "CKMBINDEX", "TROPONINI" in the last 168 hours. BNP (last 3 results) No results for input(s): "PROBNP" in the last 8760 hours. HbA1C: No results for input(s): "HGBA1C" in the last 72 hours. CBG: Recent Labs  Lab 05/17/23 1225 05/17/23 2213  GLUCAP 80 115*   Lipid Profile: No results for input(s): "CHOL", "HDL", "LDLCALC", "TRIG", "CHOLHDL", "LDLDIRECT" in the last 72 hours. Thyroid Function Tests: No results for input(s): "TSH", "T4TOTAL", "FREET4", "T3FREE", "THYROIDAB" in the last 72 hours. Anemia Panel: No results for input(s): "VITAMINB12", "FOLATE", "FERRITIN", "TIBC", "IRON", "RETICCTPCT" in the last 72  hours. Urine analysis:    Component Value Date/Time   COLORURINE YELLOW (A) 12/26/2021 1621   APPEARANCEUR HAZY (A) 12/26/2021 1621   LABSPEC 1.011 12/26/2021 1621   PHURINE 5.0 12/26/2021 1621   GLUCOSEU 150 (A) 12/26/2021 1621   HGBUR SMALL (A) 12/26/2021 1621   BILIRUBINUR NEGATIVE 12/26/2021 1621   KETONESUR NEGATIVE 12/26/2021 1621   PROTEINUR 100 (A) 12/26/2021 1621   NITRITE NEGATIVE 12/26/2021 1621   LEUKOCYTESUR TRACE (A) 12/26/2021 1621   Sepsis Labs: @LABRCNTIP (procalcitonin:4,lacticidven:4) )  No results found for this or any previous visit (from the past 240 hour(s)).   Radiological Exams on Admission: MR Cervical Spine Wo Contrast  Result Date: 05/17/2023 CLINICAL DATA:  Neck trauma, fall EXAM: MRI CERVICAL SPINE WITHOUT CONTRAST TECHNIQUE: Multiplanar, multisequence MR imaging of the cervical spine was performed. No intravenous contrast was administered. COMPARISON:  05/01/2023 MRI cervical spine, 05/17/2023 CT cervical spine FINDINGS: Alignment: Exaggeration of the normal cervical kyphosis. Vertebrae: Increased T2 hyperintense signal along the known C3-C4 osteophyte fracture line, which is not unexpected in the setting of interval healing. Redemonstrated flowing osteophytes. Vertebral body heights are preserved. Cord: Normal signal and morphology. Posterior Fossa, vertebral arteries, paraspinal tissues: Persistent prevertebral edema anterior to the fracture site. No evidence of ligamentous injury. Disc levels: C2-C3: Disc osteophyte complex. No spinal canal stenosis or neural foraminal narrowing. C3-C4: Disc osteophyte complex. Facet and uncovertebral hypertrophy. Mild-to-moderate spinal canal stenosis. No significant neural foraminal narrowing. C4-C5: Disc osteophyte complex. No spinal canal stenosis or neural foraminal narrowing. C5-C6: Disc osteophyte complex. Facet and uncovertebral hypertrophy. Mild spinal canal stenosis and mild-to-moderate bilateral neural foraminal  narrowing. C6-C7: Disc osteophyte complex. Facet and uncovertebral hypertrophy. No spinal canal stenosis. Moderate right neural foraminal narrowing. C7-T1: No significant disc bulge. No spinal canal stenosis or neuroforaminal narrowing. IMPRESSION: 1. Slightly increased edema along the known C3-C4 osteophyte fracture line, which is not unexpected in the setting of interval healing. No evidence of ligamentous injury. 2. Otherwise unchanged degenerative findings. Electronically Signed   By: Wiliam Ke M.D.   On: 05/17/2023 20:39   DG Ribs Unilateral W/Chest Right  Result Date: 05/17/2023 CLINICAL DATA:  Fall, landed on right side. EXAM: RIGHT RIBS AND CHEST - 3+ VIEW COMPARISON:  None Available. FINDINGS: Right dialysis catheter in place with the tip at the cavoatrial junction. Heart and mediastinal contours are within normal limits. No focal opacities or effusions. No acute bony abnormality. No visible rib fracture. No pneumothorax. IMPRESSION: No active cardiopulmonary disease. Electronically Signed   By: Charlett Nose M.D.   On: 05/17/2023 17:46   DG Knee Complete 4 Views Left  Result Date: 05/17/2023 CLINICAL DATA:  Fall EXAM: LEFT KNEE - COMPLETE 4+ VIEW COMPARISON:  None Available. FINDINGS: Mild degenerative changes in the left knee most pronounced in the patellofemoral compartment. No acute bony abnormality. Specifically, no fracture, subluxation, or dislocation. No joint effusion. IMPRESSION: No acute bony abnormality. Electronically Signed   By: Charlett Nose M.D.   On: 05/17/2023 17:45   DG Knee Complete 4 Views Right  Result Date: 05/17/2023 CLINICAL DATA:  Fall, landed on right side. EXAM: RIGHT KNEE - COMPLETE 4+ VIEW COMPARISON:  None Available. FINDINGS: Diffuse moderate to advanced degenerative disc disease throughout the right knee with joint space narrowing and spurring. No joint effusion. Chondrocalcinosis noted. No acute bony abnormality. Specifically, no fracture, subluxation, or  dislocation. Soft tissues are intact. IMPRESSION: Diffuse moderate to advanced tricompartment degenerative changes. No acute bony abnormality. Electronically Signed   By: Charlett Nose M.D.   On: 05/17/2023 17:44   DG Shoulder Right  Result Date: 05/17/2023 CLINICAL DATA:  Fall, landed on right arm. EXAM: RIGHT SHOULDER - 2+ VIEW COMPARISON:  None Available. FINDINGS: Mild degenerative changes in the right AC and glenohumeral joints with joint space narrowing and spurring. No acute bony abnormality. Specifically, no fracture, subluxation, or dislocation. Right dialysis catheter noted with the tip at the cavoatrial junction. No visible rib fracture. Visualized right lung clear. IMPRESSION: No acute bony abnormality. Electronically Signed  By: Charlett Nose M.D.   On: 05/17/2023 17:44   CT CERVICAL SPINE WO CONTRAST  Result Date: 05/17/2023 CLINICAL DATA:  Head trauma, moderate-severe.  Fall. EXAM: CT CERVICAL SPINE WITHOUT CONTRAST TECHNIQUE: Multidetector CT imaging of the cervical spine was performed without intravenous contrast. Multiplanar CT image reconstructions were also generated. RADIATION DOSE REDUCTION: This exam was performed according to the departmental dose-optimization program which includes automated exposure control, adjustment of the mA and/or kV according to patient size and/or use of iterative reconstruction technique. COMPARISON:  05/01/2023 CT and MRI FINDINGS: Alignment: Normal Skull base and vertebrae: Again noted are the large flowing bridging anterior osteophytes throughout the cervical and visualized upper thoracic spine compatible with diffuse idiopathic skeletal hyperostosis. Again noted is the fracture through the anterior bridging osteophyte at C3-4. Fracture line is slightly more evident on today's study, likely related to bone resorption in the interim. No new fracture. Soft tissues and spinal canal: No prevertebral fluid or swelling. No visible canal hematoma. Disc levels:  Diffuse disc space narrowing with large anterior flowing osteophytes. Mild bilateral degenerative facet disease diffusely. No visible disc herniation. Upper chest: No acute findings Other: None IMPRESSION: Again noted are the large flowing anterior osteophytes compatible with diffuse idiopathic skeletal hyperostosis. Fracture again seen through the anterior bridging osteophyte at C3-4. Fracture line slightly more evident on today's study compatible with interval bone resorption. No new fracture or malalignment. Electronically Signed   By: Charlett Nose M.D.   On: 05/17/2023 17:43   CT HEAD WO CONTRAST ( )  Result Date: 05/17/2023 CLINICAL DATA:  Head trauma, moderate-severe.  Fall. EXAM: CT HEAD WITHOUT CONTRAST TECHNIQUE: Contiguous axial images were obtained from the base of the skull through the vertex without intravenous contrast. RADIATION DOSE REDUCTION: This exam was performed according to the departmental dose-optimization program which includes automated exposure control, adjustment of the mA and/or kV according to patient size and/or use of iterative reconstruction technique. COMPARISON:  05/01/2023 CT FINDINGS: Brain: There is atrophy and chronic small vessel disease changes. No acute intracranial abnormality. Specifically, no hemorrhage, hydrocephalus, mass lesion, acute infarction, or significant intracranial injury. Vascular: No hyperdense vessel or unexpected calcification. Skull: No acute calvarial abnormality. Sinuses/Orbits: No acute findings Other: None IMPRESSION: Atrophy, chronic microvascular disease. No acute intracranial abnormality. Electronically Signed   By: Charlett Nose M.D.   On: 05/17/2023 17:38   [place] OR NERVE BLOCK-IMAGE ONLY [facility])  Result Date: 05/17/2023 There is no interpretation for this exam.  This order is for images obtained during a surgical procedure.  Please See "Surgeries" Tab for more information regarding the procedure.      Assessment/Plan Principal  Problem:   Fall Active Problems:   Closed nondisplaced fracture of third cervical vertebra (HCC)   CVA (cerebral vascular accident) (HCC)   Anemia in ESRD (end-stage renal disease) (HCC)   CAD (coronary artery disease)   Leukocytosis   Essential (primary) hypertension   End-stage renal disease on hemodialysis (HCC)   Type II diabetes mellitus with renal manifestations (HCC)   Assessment and Plan:   Fall: images showed no acute injury. Pt has severe neck pain. -Placed on head of bed for observation -Pain control: As needed Percocet, morphine, Tylenol -As needed Robaxin -fall precaution -consult TOC  Closed nondisplaced fracture of third cervical vertebra [facility]): pt is following up with Dr. Katrinka Blazing of neurosugery. MRI of C spin today showed slightly increased edema along the known C3-C4 osteophyte fracture line, which is not unexpected in the setting of interval  healing. No evidence of ligamentous injury. -continue C collar -Follow-up with neurosurgeon  History of CVA (cerebral vascular accident) (HCC) -Patient was told to hold aspirin due to procedure of AVF placement - Lipitor  Anemia in ESRD (end-stage renal disease) (HCC) -Follow-up with CBC  CAD (coronary artery disease) -Hold aspirin -Lipitor  Leukocytosis: Mild, WBC 10.9, likely reactive, no source of infection identified. -Follow repeat CBC  Essential (primary) hypertension -IV hydralazine as needed -Metoprolol  HLD: -Lipitor  End-stage renal disease on hemodialysis (TTS): -Message sent to Dr. Cherylann Ratel of renal for dialysis  Type II diabetes mellitus with renal manifestations: Recent A1c 5.5, well-controlled.  Patient is taking NovoLog and as needed Lantus, currently not taking Lantus. -Sliding scale insulin     DVT ppx: SQ Heparin     Code Status: Full code    Family Communication: not done, no family member is at bed side.     Disposition Plan:  Anticipate discharge back to previous  environment  Consults called:    Admission status and Level of care: Telemetry Medical:   for obs   Dispo: The patient is from: Home              Anticipated d/c is to: Home              Anticipated d/c date is: 1 day              Patient currently is not medically stable to d/c.    Severity of Illness:  The appropriate patient status for this patient is OBSERVATION. Observation status is judged to be reasonable and necessary in order to provide the required intensity of service to ensure the patient's safety. The patient's presenting symptoms, physical exam findings, and initial radiographic and laboratory data in the context of their medical condition is felt to place them at decreased risk for further clinical deterioration. Furthermore, it is anticipated that the patient will be medically stable for discharge from the hospital within 2 midnights of admission.        Date of Service 05/18/2023    Lorretta Harp [facility]   If 7PM-7AM, please contact night-coverage [email or web address] 05/18/2023, 1:14 AM

## 2023-05-17 NOTE — Interval H&P Note (Signed)
History and Physical Interval Note:  05/17/2023 7:41 AM  Deanna [name]  has presented today for surgery, with the diagnosis of ESRD.  The various methods of treatment have been discussed with the patient and family. After consideration of risks, benefits and other options for treatment, the patient has consented to  Procedure(s): ARTERIOVENOUS (AV) FISTULA CREATION (BRACHIALCEPHALIC) (Right) as a surgical intervention.  The patient's history has been reviewed, patient examined, no change in status, stable for surgery.  I have reviewed the patient's chart and labs.  Questions were answered to the patient's satisfaction.     Festus Barren

## 2023-05-17 NOTE — Anesthesia Procedure Notes (Signed)
Anesthesia Regional Block: Supraclavicular block   Pre-Anesthetic Checklist: , timeout performed,  Correct Patient, Correct Site, Correct Laterality,  Correct Procedure, Correct Position, site marked,  Risks and benefits discussed,  Surgical consent,  Pre-op evaluation,  At surgeon's request and post-op pain management  Laterality: Upper and Right  Prep: chloraprep       Needles:  Injection technique: Single-shot  Needle Type: Stimiplex     Needle Length: 9cm  Needle Gauge: 22     Additional Needles:   Procedures:,,,, ultrasound used (permanent image in chart),,    Narrative:  Start time: 05/17/2023 8:47 AM End time: 05/17/2023 8:53 AM Injection made incrementally with aspirations every 5 mL.  Performed by: Personally   Additional Notes: Patient consented for risk and benefits of nerve block including but not limited to nerve damage, Horner's syndrome, failed block, bleeding and infection.  Patient voiced understanding.  Functioning IV was confirmed and monitors were applied.  Timeout done prior to procedure and prior to any sedation being given to the patient.  Patient confirmed procedure site prior to any sedation given to the patient.  A 50mm 22ga Stimuplex needle was used. Sterile prep,hand hygiene and sterile gloves were used.  Minimal sedation used for procedure.  No paresthesia endorsed by patient during the procedure.  Negative aspiration and negative test dose prior to incremental administration of local anesthetic. The patient tolerated the procedure well with no immediate complications.

## 2023-05-17 NOTE — Discharge Instructions (Signed)

## 2023-05-17 NOTE — ED Provider Notes (Signed)
[facility] Provider Note    Event Date/Time   First MD Initiated Contact with Patient 05/17/23 1600     (approximate)   History   Fall   HPI  Deanna [name] is a 72 y.o. female  here with neck pain, fall. Pt was on her way home from AV fistula placement, when the chair she was being wheeled in fell over. She states she landed on her R side and since then has had ongoing and worsening neck pain, R arm pain, and b/l knee pain. She lives alone and is worried about her pain and ability to care for herself. No focal numbness, weakness other htan the numbness of her R arm from the anesthesia block. No other complaints.       Physical Exam   Triage Vital Signs: ED Triage Vitals  Encounter Vitals Group     BP 05/17/23 1559 126/68     Systolic BP Percentile --      Diastolic BP Percentile --      Pulse Rate 05/17/23 1559 85     Resp 05/17/23 1559 18     Temp 05/17/23 1559 (!) 97 F (36.1 C)     Temp src --      SpO2 05/17/23 1559 98 %     Weight 05/17/23 1600 174 lb 2.6 oz (79 kg)     Height 05/17/23 1600 6' 1.5" (1.867 m)     Head Circumference --      Peak Flow --      Pain Score 05/17/23 1600 7     Pain Loc --      Pain Education --      Exclude from Growth Chart --     Most recent vital signs: Vitals:   05/17/23 2042 05/17/23 2043  BP:  131/66  Pulse:  71  Resp:  18  Temp:  97.6 F (36.4 C)  SpO2: 100% 100%     General: Awake, no distress.  CV:  Good peripheral perfusion. RRR. Resp:  Normal work of breathing. Lungs clear. No chest wall TTP. Abd:  No distention. No guarding, rebound. No flank pain. Other:  RUE with mild erythema at graft site but no bleeding, op sites c/d/I. Moderate TTP over R shoulder. TTP over bilateral knees, no bruising or deformity. Strength 5/5 bl UE and LE. Normal sensation to light touch.   ED Results / Procedures / Treatments   Labs (all labs ordered are listed, but only abnormal results are  displayed) Labs Reviewed  CBC WITH DIFFERENTIAL/PLATELET - Abnormal; Notable for the following components:      Result Value   WBC 10.9 (*)    Hemoglobin 11.6 (*)    HCT 35.6 (*)    Neutro Abs 8.3 (*)    All other components within normal limits  BASIC METABOLIC PANEL - Abnormal; Notable for the following components:   Chloride 95 (*)    BUN 35 (*)    Creatinine, Ser 6.23 (*)    GFR, Estimated 7 (*)    All other components within normal limits     EKG Normal sinus rhythm, VR 78. PR 192, QRS 88, QTc 469. No acute ST elevations or depressions. No ischemia or infarct.   RADIOLOGY CT C Spine: No apparent new fx CT Head: NAICA CXR: Clear Knee R: Negative Knee L: Negative MR Spine Cervical: Pending   I also independently reviewed and agree with radiologist interpretations.   PROCEDURES:  Critical Care performed: No  MEDICATIONS ORDERED IN ED: Medications  oxyCODONE-acetaminophen (PERCOCET/ROXICET) 5-325 MG per tablet 1 tablet (1 tablet Oral Given 05/17/23 1824)  ondansetron (ZOFRAN-ODT) disintegrating tablet 4 mg (4 mg Oral Given 05/17/23 1825)  oxyCODONE-acetaminophen (PERCOCET/ROXICET) 5-325 MG per tablet 1 tablet (1 tablet Oral Given 05/17/23 2041)     IMPRESSION / MDM / ASSESSMENT AND PLAN / ED COURSE  I reviewed the triage vital signs and the nursing notes.                              Differential diagnosis includes, but is not limited to, fall with possible worsening cervical injury, fracture, generalized weakness from anemia, dehydration, lyte abnormality  Patient's presentation is most consistent with acute presentation with potential threat to life or bodily function.  The patient is on the cardiac monitor to evaluate for evidence of arrhythmia and/or significant heart rate changes  72 yo F here with fall, neck pain, b/l knee pain. Pt was on her way home from AV graft placement and also recently had a C3-4 DISH osteophyte fx.   Re: fall - plain films of  shoulder, knees, chest are all negative for acute fx. Her op site appears intact with no injuries. MR C Spine is pending. Pt has a h/o DISH and is high risk for occult ligamentous or neuro injury. Will f/u MRI. If negative, she will need to be ambulated to see how her pain/mobility is, and is o/w stable from a medical perspective. Screening labs reassuring. No significant change in Hgb. BMP is at baseline for her ESRD status. Will f/u MRI and dispo accordingly.  Plan: - If MRI negative, ambulate - if severe pain or difficulty, would admit for pain control and obs - If MRI positive, c/s NSGY  FINAL CLINICAL IMPRESSION(S) / ED DIAGNOSES   Final diagnoses:  Fall, initial encounter  H/O cervical fracture     Rx / DC Orders   ED Discharge Orders     None        Note:  This document was prepared using Dragon voice recognition software and may include unintentional dictation errors.   Shaune Pollack, MD 05/17/23 2150

## 2023-05-17 NOTE — Anesthesia Preprocedure Evaluation (Addendum)
Anesthesia Evaluation  Patient identified by MRN, date of birth, ID band Patient awake    Reviewed: Allergy & Precautions, NPO status , Patient's Chart, lab work & pertinent test results  History of Anesthesia Complications Negative for: history of anesthetic complications  Airway Mallampati: Intubated  TM Distance: <3 FB Neck ROM: full    Dental  (+) Missing   Pulmonary shortness of breath and with exertion   Pulmonary exam normal        Cardiovascular hypertension, (-) angina + CAD and +CHF  Normal cardiovascular exam     Neuro/Psych Patient reports right arm numbness and weakness from her previous stroke CVA (right arm), Residual Symptoms  negative psych ROS   GI/Hepatic Neg liver ROS,GERD  Controlled,,  Endo/Other  diabetes, Type 2    Renal/GU DialysisRenal disease  negative genitourinary   Musculoskeletal   Abdominal   Peds  Hematology negative hematology ROS (+)   Anesthesia Other Findings Past Medical History: 12/2021: ACE inhibitor-aggravated angioedema     Comment:  required intubation No date: Anemia 04/2023: C3 cervical fracture (HCC) No date: CHF (congestive heart failure) (HCC)     Comment:  combined systolic and diastolic No date: Coronary artery disease No date: ESRD on dialysis [facility])     Comment:  T-Th-Sat No date: GERD (gastroesophageal reflux disease) No date: Hyperlipidemia No date: Hypertension No date: Pneumonia No date: Secondary hyperparathyroidism of renal origin (HCC) No date: Stroke [facility])     Comment:  with right sided weakness No date: Type 2 diabetes mellitus with diabetic neuropathy (HCC)  Past Surgical History: 03/01/2016: CARDIAC CATHETERIZATION; Left     Comment:  Procedure: Left Heart Cath and Coronary Angiography;                Surgeon: Dalia Heading, MD;  Location: [facility] INVASIVE CV               LAB;  Service: Cardiovascular;  Laterality: Left; No date: CESAREAN  SECTION 01/10/2022: DIALYSIS/PERMA CATHETER INSERTION; Right     Comment:  Procedure: DIALYSIS/PERMA CATHETER INSERTION;  Surgeon:               Bertram Denver, MD;  Location: [facility] INVASIVE CV LAB;                Service: Cardiovascular;  Laterality: Right; 04/12/2022: DIALYSIS/PERMA CATHETER INSERTION; N/A     Comment:  Procedure: DIALYSIS/PERMA CATHETER INSERTION;  Surgeon:               Annice Needy, MD;  Location: [facility] INVASIVE CV LAB;                Service: Cardiovascular;  Laterality: N/A; 01/14/2022: INTUBATION-ENDOTRACHEAL WITH TRACHEOSTOMY STANDBY; Left     Comment:  Procedure: INTUBATION-NASAL WITH TRACHEOSTOMY STANDBY;                Surgeon: Linus Salmons, MD;  Location: [facility] ORS;                Service: ENT;  Laterality: Left; 01/04/2022: TEMPORARY DIALYSIS CATHETER; N/A     Comment:  Procedure: TEMPORARY DIALYSIS CATHETER;  Surgeon: Annice Needy, MD;  Location: [facility] INVASIVE CV LAB;  Service:               Cardiovascular;  Laterality: N/A;  BMI    Body Mass Index: 22.78 kg/m      Reproductive/Obstetrics negative  OB ROS                             Anesthesia Physical Anesthesia Plan  ASA: 4  Anesthesia Plan: General   Post-op Pain Management: Regional block*   Induction: Intravenous  PONV Risk Score and Plan: Propofol infusion and TIVA  Airway Management Planned: Natural Airway and Nasal Cannula  Additional Equipment:   Intra-op Plan:   Post-operative Plan:   Informed Consent: I have reviewed the patients History and Physical, chart, labs and discussed the procedure including the risks, benefits and alternatives for the proposed anesthesia with the patient or authorized representative who has indicated his/her understanding and acceptance.     Dental Advisory Given  Plan Discussed with: Anesthesiologist, CRNA and Surgeon  Anesthesia Plan Comments: (Plan to leave C collar on for case  Patient consented for  risks of anesthesia including but not limited to:  - adverse reactions to medications - risk of airway placement if required - damage to eyes, teeth, lips or other oral mucosa - nerve damage due to positioning  - sore throat or hoarseness - Damage to heart, brain, nerves, lungs, other parts of body or loss of life  Patient voiced understanding.)       Anesthesia Quick Evaluation

## 2023-05-17 NOTE — Transfer of Care (Signed)
Immediate Anesthesia Transfer of Care Note  Patient: Deanna [name]  Procedure(s) Performed: INSERTION OF ARTERIOVENOUS (AV) GORE-TEX GRAFT ARM (Right: Arm Lower)  Patient Location: PACU  Anesthesia Type:General  Level of Consciousness: drowsy  Airway & Oxygen Therapy: Patient Spontanous Breathing and Patient connected to face mask oxygen  Post-op Assessment: Report given to RN and Post -op Vital signs reviewed and stable  Post vital signs: Reviewed and stable  Last Vitals:  Vitals Value Taken Time  BP 96/54 05/17/23 1223  Temp    Pulse 83 05/17/23 1225  Resp 16 05/17/23 1225  SpO2 100 % 05/17/23 1225  Vitals shown include unfiled device data.  Last Pain:  Vitals:   05/17/23 0815  TempSrc: Temporal  PainSc: 5          Complications: No notable events documented.

## 2023-05-17 NOTE — Anesthesia Postprocedure Evaluation (Signed)
Anesthesia Post Note  Patient: [place] Leonor  Procedure(s) Performed: INSERTION OF ARTERIOVENOUS (AV) GORE-TEX GRAFT ARM (Right: Arm Lower)  Patient location during evaluation: PACU Anesthesia Type: General Level of consciousness: awake and alert Pain management: pain level controlled Vital Signs Assessment: post-procedure vital signs reviewed and stable Respiratory status: spontaneous breathing, nonlabored ventilation, respiratory function stable and patient connected to nasal cannula oxygen Cardiovascular status: blood pressure returned to baseline and stable Postop Assessment: no apparent nausea or vomiting Anesthetic complications: no   No notable events documented.   Last Vitals:  Vitals:   05/17/23 1345 05/17/23 1417  BP: (!) 147/78 127/65  Pulse: 88 74  Resp: (!) 26 20  Temp: (!) 36.1 C (!) 36.1 C  SpO2: 100% 98%    Last Pain:  Vitals:   05/17/23 1420  TempSrc:   PainSc: 0-No pain                 Cleda Mccreedy Gaege Sangalang

## 2023-05-17 NOTE — Anesthesia Procedure Notes (Addendum)
Procedure Name: General with mask airway Date/Time: 05/17/2023 10:40 AM  Performed by: Mohammed Kindle, CRNAPre-anesthesia Checklist: Patient identified, Emergency Drugs available, Patient being monitored and Suction available Patient Re-evaluated:Patient Re-evaluated prior to induction Oxygen Delivery Method: Simple face mask

## 2023-05-18 ENCOUNTER — Encounter: Payer: Self-pay | Admitting: Internal Medicine

## 2023-05-18 DIAGNOSIS — E1129 Type 2 diabetes mellitus with other diabetic kidney complication: Secondary | ICD-10-CM | POA: Diagnosis present

## 2023-05-18 DIAGNOSIS — W19XXXA Unspecified fall, initial encounter: Secondary | ICD-10-CM | POA: Diagnosis not present

## 2023-05-18 DIAGNOSIS — M542 Cervicalgia: Secondary | ICD-10-CM | POA: Diagnosis not present

## 2023-05-18 DIAGNOSIS — N186 End stage renal disease: Secondary | ICD-10-CM | POA: Diagnosis not present

## 2023-05-18 DIAGNOSIS — E1122 Type 2 diabetes mellitus with diabetic chronic kidney disease: Secondary | ICD-10-CM | POA: Diagnosis not present

## 2023-05-18 LAB — CBC
HCT: 32.5 % — ABNORMAL LOW (ref 36.0–46.0)
Hemoglobin: 10.4 g/dL — ABNORMAL LOW (ref 12.0–15.0)
MCH: 27.6 pg (ref 26.0–34.0)
MCHC: 32 g/dL (ref 30.0–36.0)
MCV: 86.2 fL (ref 80.0–100.0)
Platelets: 227 10*3/uL (ref 150–400)
RBC: 3.77 MIL/uL — ABNORMAL LOW (ref 3.87–5.11)
RDW: 14.8 % (ref 11.5–15.5)
WBC: 9.8 10*3/uL (ref 4.0–10.5)
nRBC: 0 % (ref 0.0–0.2)

## 2023-05-18 LAB — BASIC METABOLIC PANEL
Anion gap: 15 (ref 5–15)
BUN: 34 mg/dL — ABNORMAL HIGH (ref 8–23)
CO2: 27 mmol/L (ref 22–32)
Calcium: 9 mg/dL (ref 8.9–10.3)
Chloride: 97 mmol/L — ABNORMAL LOW (ref 98–111)
Creatinine, Ser: 6.5 mg/dL — ABNORMAL HIGH (ref 0.44–1.00)
GFR, Estimated: 6 mL/min — ABNORMAL LOW (ref >60)
Glucose, Bld: 86 mg/dL (ref 70–99)
Potassium: 4.4 mmol/L (ref 3.5–5.1)
Sodium: 139 mmol/L (ref 135–145)

## 2023-05-18 LAB — FOLATE: Folate: 9.3 ng/mL (ref >5.9)

## 2023-05-18 LAB — HEPATITIS B SURFACE ANTIGEN: Hepatitis B Surface Ag: NONREACTIVE

## 2023-05-18 LAB — CBG MONITORING, ED: Glucose-Capillary: 75 mg/dL (ref 70–99)

## 2023-05-18 LAB — GLUCOSE, CAPILLARY
Glucose-Capillary: 143 mg/dL — ABNORMAL HIGH (ref 70–99)
Glucose-Capillary: 86 mg/dL (ref 70–99)

## 2023-05-18 LAB — VITAMIN B12: Vitamin B-12: 645 pg/mL (ref 180–914)

## 2023-05-18 MED ORDER — HEPARIN SODIUM (PORCINE) 1000 UNIT/ML DIALYSIS: 1000.0000 [IU] | INTRAMUSCULAR | Status: DC | PRN

## 2023-05-18 MED ORDER — ALTEPLASE 2 MG IJ SOLR: 2.0000 mg | Freq: Once | INTRAMUSCULAR | Status: DC | PRN

## 2023-05-18 MED ORDER — METHOCARBAMOL 500 MG PO TABS: 500.0000 mg | ORAL_TABLET | Freq: Three times a day (TID) | ORAL | Status: DC | PRN

## 2023-05-18 MED ORDER — HEPARIN SODIUM (PORCINE) 1000 UNIT/ML IJ SOLN
INTRAMUSCULAR | Status: AC
  Filled 2023-05-18: qty 2

## 2023-05-18 MED ORDER — CHLORHEXIDINE GLUCONATE CLOTH 2 % EX PADS
6.0000 | MEDICATED_PAD | Freq: Every day | CUTANEOUS | Status: DC
  Administered 2023-05-19 – 2023-05-20 (×2): 6 via TOPICAL
  Filled 2023-05-18: qty 6

## 2023-05-18 NOTE — Progress Notes (Signed)
Hemodialysis note  Received patient in bed to unit. Alert and oriented.  Informed consent signed and in chart.  Treatment initiated: 1017 Treatment completed: 1330  Patient tolerated well. Transported back to room, alert without acute distress.  Report given to patient's RN.   Access used: Right chest HD PermCath Access issues: High arterial pressure. Lines were reversed during Tx.  Total UF removed: 900 ml Medication(s) given:  none  Post HD weight: unable to obtain weight.cale is broken.    Wolfgang Phoenix [facility]

## 2023-05-18 NOTE — Progress Notes (Signed)
PT Cancellation Note  Patient Details Name: Deanna [name] MRN: [id number] DOB: [birth year]   Cancelled Treatment:    Reason Eval/Treat Not Completed: Patient at procedure or test/unavailable Orders received, chart reviewed. On first attempt, pharmacy tech present and requested to return later. On second attempt, patient off unit at HD. Will re-attempt at later date/time as schedule allows.   Maylon Peppers, PT, DPT Physical Therapist - [place]  [facility]  [name] 05/18/2023, 11:10 AM

## 2023-05-18 NOTE — ED Notes (Signed)
RN to bedside to introduce self to pt. Pt is caox4, in no acute distress.

## 2023-05-18 NOTE — ED Notes (Signed)
This RN to bedside. Patient appears agitated and states that she does not "understand" why every keeps coming into her room. This RN to educate patient on purpose at bedside (for medications). Patient appears resolved with agitation after education.

## 2023-05-18 NOTE — Progress Notes (Signed)
[facility]  ROUNDING NOTE   Subjective:   Deanna [name] is a 72 year old African-American female with a past medical history of coronary artery disease, status post PCI, CHF with reduced ejection fraction, CKD, diabetes mellitus, hypertension, and end stage renal disease on hemodialysis. Patient presents to the ED after suffering a fall from her rollator. She has been admitted under observation for Fall [W19.XXXA] H/O cervical fracture [Z87.81] Fall, initial encounter [W19.XXXA] Pain of right upper extremity [M79.601]  Patient is known to our practice and receives outpatient dialysis treatments at [facility] on a TTS schedule, supervised by Dr Cherylann Ratel. Her last treatment was completed on Tuesday. She missed Thursday due to a scheduled vascular procedure for placement of AV fistula. She was scheduled to have a treatment today. She says she received transportation after her procedure home. She was taken off the [place] and sat on a rollator to be assisted into the house. She is unsure what caused the fall but she and the rollator fell to the right while the care taker fell to the left. She reports right arm pain and increased neck pain. Currently in a neck brace due to cervical fracture.   Labs on ED arrival stable. Hgb 10.4. Knee, rib and shoulder xrays negative for acute findings.   We have been consulted to manage dialysis needs.    Objective:  Vital signs in last 24 hours:  Temp:  [97 F (36.1 C)-98.3 F (36.8 C)] 98.3 F (36.8 C) (08/23 0930) Pulse Rate:  [65-99] 81 (08/23 1330) Resp:  [12-26] 18 (08/23 1330) BP: (73-148)/(52-107) 131/60 (08/23 1330) SpO2:  [92 %-100 %] 100 % (08/23 1330) Weight:  [79 kg] 79 kg (08/22 1600)  Weight change:  Filed Weights   05/17/23 1600  Weight: 79 kg    Intake/Output: No intake/output data recorded.   Intake/Output this shift:  Total I/O In: -  Out: 900 [Other:900]  Physical Exam: General: NAD  Head: Normocephalic,  atraumatic. Moist oral mucosal membranes, neck brace present  Eyes: Anicteric  Lungs:  Clear to auscultation, normal effort  Heart: Regular rate and rhythm  Abdomen:  Soft, nontender,   Extremities:  trace peripheral edema.  Neurologic: Nonfocal, moving all four extremities  Skin: No lesions  Access: Rt permcath, AVF placed on 05/17/23    Basic Metabolic Panel: Recent Labs  Lab 05/15/23 1340 05/17/23 0825 05/17/23 1850 05/18/23 0600  NA 135 138 136 139  K 2.8* 3.6 4.1 4.4  CL 93* 99 95* 97*  CO2 28  --  26 27  GLUCOSE 89 89 77 86  BUN 13 30* 35* 34*  CREATININE 3.21* 6.10* 6.23* 6.50*  CALCIUM 8.9  --  9.2 9.0    Liver Function Tests: No results for input(s): "AST", "ALT", "ALKPHOS", "BILITOT", "PROT", "ALBUMIN" in the last 168 hours. No results for input(s): "LIPASE", "AMYLASE" in the last 168 hours. No results for input(s): "AMMONIA" in the last 168 hours.  CBC: Recent Labs  Lab 05/15/23 1340 05/17/23 0825 05/17/23 1850 05/18/23 0600  WBC 11.1*  --  10.9* 9.8  NEUTROABS 8.4*  --  8.3*  --   HGB 11.7* 14.3 11.6* 10.4*  HCT 35.6* 42.0 35.6* 32.5*  MCV 84.8  --  86.2 86.2  PLT 286  --  261 227    Cardiac Enzymes: No results for input(s): "CKTOTAL", "CKMB", "CKMBINDEX", "TROPONINI" in the last 168 hours.  BNP: Invalid input(s): "POCBNP"  CBG: Recent Labs  Lab 05/17/23 1225 05/17/23 2213 05/18/23 1610  GLUCAP 80 115* 75    Microbiology: Results for orders placed or performed during the hospital encounter of 01/20/22  Blood culture (routine x 2)     Status: None   Collection Time: 01/20/22  7:22 PM   Specimen: BLOOD  Result Value Ref Range Status   Specimen Description BLOOD BLOOD RIGHT HAND  Final   Special Requests   Final    BOTTLES DRAWN AEROBIC AND ANAEROBIC Blood Culture results may not be optimal due to an inadequate volume of blood received in culture bottles   Culture   Final    NO GROWTH 5 DAYS Performed at [facility], [address] [place], [place] [address]    Report Status 01/25/2022 FINAL  Final  Blood culture (routine x 2)     Status: None   Collection Time: 01/20/22  7:22 PM   Specimen: BLOOD  Result Value Ref Range Status   Specimen Description BLOOD BLOOD LEFT HAND  Final   Special Requests   Final    BOTTLES DRAWN AEROBIC AND ANAEROBIC Blood Culture results may not be optimal due to an inadequate volume of blood received in culture bottles   Culture   Final    NO GROWTH 5 DAYS Performed at [facility], [address] [place], [place] [address]    Report Status 01/25/2022 FINAL  Final  Resp Panel by RT-PCR (Flu A&B, Covid) Nasopharyngeal Swab     Status: None   Collection Time: 01/20/22  7:28 PM   Specimen: Nasopharyngeal Swab; Nasopharyngeal(NP) swabs in vial transport medium  Result Value Ref Range Status   SARS Coronavirus 2 by RT PCR NEGATIVE NEGATIVE Final    Comment: (NOTE) SARS-CoV-2 target nucleic acids are NOT DETECTED.  The SARS-CoV-2 RNA is generally detectable in upper respiratory specimens during the acute phase of infection. The lowest concentration of SARS-CoV-2 viral copies this assay can detect is 138 copies/mL. A negative result does not preclude SARS-Cov-2 infection and should not be used as the sole basis for treatment or other patient management decisions. A negative result may occur with  improper specimen collection/handling, submission of specimen other than nasopharyngeal swab, presence of viral mutation(s) within the areas targeted by this assay, and inadequate number of viral copies(<138 copies/mL). A negative result must be combined with clinical observations, patient history, and epidemiological information. The expected result is Negative.  Fact Sheet for Patients:  BloggerCourse.com  Fact Sheet for Healthcare Providers:  SeriousBroker.it  This test is no t yet approved or cleared by the [place] FDA and  has been authorized for detection and/or diagnosis of SARS-CoV-2 by FDA under an Emergency Use Authorization (EUA). This EUA will remain  in effect (meaning this test can be used) for the duration of the COVID-19 declaration under Section 564(b)(1) of the Act, 21 U.S.C.section 360bbb-3(b)(1), unless the authorization is terminated  or revoked sooner.       Influenza A by PCR NEGATIVE NEGATIVE Final   Influenza B by PCR NEGATIVE NEGATIVE Final    Comment: (NOTE) The Xpert Xpress SARS-CoV-2/FLU/RSV plus assay is intended as an aid in the diagnosis of influenza from Nasopharyngeal swab specimens and should not be used as a sole basis for treatment. Nasal washings and aspirates are unacceptable for Xpert Xpress SARS-CoV-2/FLU/RSV testing.  Fact Sheet for Patients: BloggerCourse.com  Fact Sheet for Healthcare Providers: SeriousBroker.it  This test is not yet approved or cleared by the [place] FDA and has been authorized for detection and/or diagnosis  of SARS-CoV-2 by FDA under an Emergency Use Authorization (EUA). This EUA will remain in effect (meaning this test can be used) for the duration of the COVID-19 declaration under Section 564(b)(1) of the Act, 21 U.S.C. section 360bbb-3(b)(1), unless the authorization is terminated or revoked.  Performed at [facility], [address] [place], [place] [address]     Coagulation Studies: No results for input(s): "LABPROT", "INR" in the last 72 hours.  Urinalysis: No results for input(s): "COLORURINE", "LABSPEC", "PHURINE", "GLUCOSEU", "HGBUR", "BILIRUBINUR", "KETONESUR", "PROTEINUR", "UROBILINOGEN", "NITRITE", "LEUKOCYTESUR" in the last 72 hours.  Invalid input(s): "APPERANCEUR"    Imaging: MR Cervical Spine Wo Contrast  Result Date: 05/17/2023 CLINICAL DATA:  Neck trauma, fall EXAM: MRI CERVICAL SPINE WITHOUT CONTRAST TECHNIQUE: Multiplanar,  multisequence MR imaging of the cervical spine was performed. No intravenous contrast was administered. COMPARISON:  05/01/2023 MRI cervical spine, 05/17/2023 CT cervical spine FINDINGS: Alignment: Exaggeration of the normal cervical kyphosis. Vertebrae: Increased T2 hyperintense signal along the known C3-C4 osteophyte fracture line, which is not unexpected in the setting of interval healing. Redemonstrated flowing osteophytes. Vertebral body heights are preserved. Cord: Normal signal and morphology. Posterior Fossa, vertebral arteries, paraspinal tissues: Persistent prevertebral edema anterior to the fracture site. No evidence of ligamentous injury. Disc levels: C2-C3: Disc osteophyte complex. No spinal canal stenosis or neural foraminal narrowing. C3-C4: Disc osteophyte complex. Facet and uncovertebral hypertrophy. Mild-to-moderate spinal canal stenosis. No significant neural foraminal narrowing. C4-C5: Disc osteophyte complex. No spinal canal stenosis or neural foraminal narrowing. C5-C6: Disc osteophyte complex. Facet and uncovertebral hypertrophy. Mild spinal canal stenosis and mild-to-moderate bilateral neural foraminal narrowing. C6-C7: Disc osteophyte complex. Facet and uncovertebral hypertrophy. No spinal canal stenosis. Moderate right neural foraminal narrowing. C7-T1: No significant disc bulge. No spinal canal stenosis or neuroforaminal narrowing. IMPRESSION: 1. Slightly increased edema along the known C3-C4 osteophyte fracture line, which is not unexpected in the setting of interval healing. No evidence of ligamentous injury. 2. Otherwise unchanged degenerative findings. Electronically Signed   By: Wiliam Ke M.D.   On: 05/17/2023 20:39   DG Ribs Unilateral W/Chest Right  Result Date: 05/17/2023 CLINICAL DATA:  Fall, landed on right side. EXAM: RIGHT RIBS AND CHEST - 3+ VIEW COMPARISON:  None Available. FINDINGS: Right dialysis catheter in place with the tip at the cavoatrial junction. Heart and  mediastinal contours are within normal limits. No focal opacities or effusions. No acute bony abnormality. No visible rib fracture. No pneumothorax. IMPRESSION: No active cardiopulmonary disease. Electronically Signed   By: Charlett Nose M.D.   On: 05/17/2023 17:46   DG Knee Complete 4 Views Left  Result Date: 05/17/2023 CLINICAL DATA:  Fall EXAM: LEFT KNEE - COMPLETE 4+ VIEW COMPARISON:  None Available. FINDINGS: Mild degenerative changes in the left knee most pronounced in the patellofemoral compartment. No acute bony abnormality. Specifically, no fracture, subluxation, or dislocation. No joint effusion. IMPRESSION: No acute bony abnormality. Electronically Signed   By: Charlett Nose M.D.   On: 05/17/2023 17:45   DG Knee Complete 4 Views Right  Result Date: 05/17/2023 CLINICAL DATA:  Fall, landed on right side. EXAM: RIGHT KNEE - COMPLETE 4+ VIEW COMPARISON:  None Available. FINDINGS: Diffuse moderate to advanced degenerative disc disease throughout the right knee with joint space narrowing and spurring. No joint effusion. Chondrocalcinosis noted. No acute bony abnormality. Specifically, no fracture, subluxation, or dislocation. Soft tissues are intact. IMPRESSION: Diffuse moderate to advanced tricompartment degenerative changes. No acute bony abnormality. Electronically Signed   By: Charlett Nose  M.D.   On: 05/17/2023 17:44   DG Shoulder Right  Result Date: 05/17/2023 CLINICAL DATA:  Fall, landed on right arm. EXAM: RIGHT SHOULDER - 2+ VIEW COMPARISON:  None Available. FINDINGS: Mild degenerative changes in the right AC and glenohumeral joints with joint space narrowing and spurring. No acute bony abnormality. Specifically, no fracture, subluxation, or dislocation. Right dialysis catheter noted with the tip at the cavoatrial junction. No visible rib fracture. Visualized right lung clear. IMPRESSION: No acute bony abnormality. Electronically Signed   By: Charlett Nose M.D.   On: 05/17/2023 17:44   CT  CERVICAL SPINE WO CONTRAST  Result Date: 05/17/2023 CLINICAL DATA:  Head trauma, moderate-severe.  Fall. EXAM: CT CERVICAL SPINE WITHOUT CONTRAST TECHNIQUE: Multidetector CT imaging of the cervical spine was performed without intravenous contrast. Multiplanar CT image reconstructions were also generated. RADIATION DOSE REDUCTION: This exam was performed according to the departmental dose-optimization program which includes automated exposure control, adjustment of the mA and/or kV according to patient size and/or use of iterative reconstruction technique. COMPARISON:  05/01/2023 CT and MRI FINDINGS: Alignment: Normal Skull base and vertebrae: Again noted are the large flowing bridging anterior osteophytes throughout the cervical and visualized upper thoracic spine compatible with diffuse idiopathic skeletal hyperostosis. Again noted is the fracture through the anterior bridging osteophyte at C3-4. Fracture line is slightly more evident on today's study, likely related to bone resorption in the interim. No new fracture. Soft tissues and spinal canal: No prevertebral fluid or swelling. No visible canal hematoma. Disc levels: Diffuse disc space narrowing with large anterior flowing osteophytes. Mild bilateral degenerative facet disease diffusely. No visible disc herniation. Upper chest: No acute findings Other: None IMPRESSION: Again noted are the large flowing anterior osteophytes compatible with diffuse idiopathic skeletal hyperostosis. Fracture again seen through the anterior bridging osteophyte at C3-4. Fracture line slightly more evident on today's study compatible with interval bone resorption. No new fracture or malalignment. Electronically Signed   By: Charlett Nose M.D.   On: 05/17/2023 17:43   CT HEAD WO CONTRAST ( )  Result Date: 05/17/2023 CLINICAL DATA:  Head trauma, moderate-severe.  Fall. EXAM: CT HEAD WITHOUT CONTRAST TECHNIQUE: Contiguous axial images were obtained from the base of the skull  through the vertex without intravenous contrast. RADIATION DOSE REDUCTION: This exam was performed according to the departmental dose-optimization program which includes automated exposure control, adjustment of the mA and/or kV according to patient size and/or use of iterative reconstruction technique. COMPARISON:  05/01/2023 CT FINDINGS: Brain: There is atrophy and chronic small vessel disease changes. No acute intracranial abnormality. Specifically, no hemorrhage, hydrocephalus, mass lesion, acute infarction, or significant intracranial injury. Vascular: No hyperdense vessel or unexpected calcification. Skull: No acute calvarial abnormality. Sinuses/Orbits: No acute findings Other: None IMPRESSION: Atrophy, chronic microvascular disease. No acute intracranial abnormality. Electronically Signed   By: Charlett Nose M.D.   On: 05/17/2023 17:38   [place] OR NERVE BLOCK-IMAGE ONLY [facility])  Result Date: 05/17/2023 There is no interpretation for this exam.  This order is for images obtained during a surgical procedure.  Please See "Surgeries" Tab for more information regarding the procedure.     Medications:     atorvastatin  40 mg Oral Daily   Chlorhexidine Gluconate Cloth  6 each Topical Q0600   ferrous sulfate  325 mg Oral Q breakfast   furosemide  20 mg Oral Daily   gabapentin  300 mg Oral TID   heparin  5,000 Units Subcutaneous Q8H   insulin aspart  0-5  Units Subcutaneous QHS   insulin aspart  0-9 Units Subcutaneous TID WC   metoprolol tartrate  25 mg Oral BID   acetaminophen, alteplase, heparin, hydrALAZINE, methocarbamol, morphine injection, ondansetron (ZOFRAN) IV, oxyCODONE-acetaminophen  Assessment/ Plan:  Deanna [name] is a 72 y.o.  female with a past medical history of coronary artery disease, status post PCI, CHF with reduced ejection fraction, CKD, diabetes mellitus, hypertension, and end stage renal disease on hemodialysis. Patient presents to the ED after suffering a fall from her  rollator. She has been admitted under observation for Fall [W19.XXXA] H/O cervical fracture [Z87.81] Fall, initial encounter [W19.XXXA] Pain of right upper extremity [M79.601]  [facility]/TTS/Rt permcath/72.0kg   End stage renal disease on hemodialysis. Last treatment received on Tuesday. Will complete shortened treatment today and scheduled treatment on Saturday.   2. Anemia of chronic kidney disease  Lab Results  Component Value Date   HGB 10.4 (L) 05/18/2023    Hgb within desired range.   3. Secondary Hyperparathyroidism: with outpatient labs: PTH 656, phosphorus 7.1, calcium 9.2 on 05/08/23.    Lab Results  Component Value Date   PTH 396 (H) 01/08/2022   CALCIUM 9.0 05/18/2023   CAION 1.15 05/17/2023   PHOS 4.3 01/25/2022    Cinacalcet, sevelamer and calcitriol ordered outpatient. Bone minerals within desired range.   4. Hypertension with chronic kidney disease. Home regimen includes furosemide and metoprolol.    LOS: 0 Carmell Elgin 8/23/20241:43 PM

## 2023-05-18 NOTE — Progress Notes (Signed)
OT Cancellation Note  Patient Details Name: Deanna [name] MRN: [id number] DOB: Aug 20, 1951   Cancelled Treatment:    Reason Eval/Treat Not Completed: Patient at procedure or test/ unavailable. Order received, chart reviewed. Attempted x2 this AM, pt with staff requesting to come back then pt at HD. Will re-attempt as pt available.   Kathie Dike, M.S. OTR/L  05/18/23, 11:12 AM  ascom [phone]

## 2023-05-18 NOTE — ED Notes (Signed)
Pt assisted back to bed

## 2023-05-18 NOTE — ED Notes (Signed)
Pt assisted to bedside toilet

## 2023-05-18 NOTE — Progress Notes (Signed)
[facility] Progress Note  Patient: Deanna [name]    ZOX:[id number]  DOA: 05/17/2023     Date of Service: the patient was seen and examined on 05/18/2023  Chief Complaint  Patient presents with   Fall   Brief hospital course: Neira Butson is a 72 y.o. female with medical history significant of ESRD-HD (TTS), HTN, H LD, DM, CAD, sCHF with EF 30%, stroke, anemia, recent C3 cervical fracture on C-collar, angioedema due to ACEI, who presents with fall and neck pain.     Pt states that she had right arm AVF placement by Dr. Wyn Quaker of [facility] today. She was on her way home, when the wheelchair fell over. She states she landed on her R side and since then has had ongoing and worsening neck pain, R shoulder pain, and b/l knee pain.  Her neck pain is constant, severe, radiating to bilateral shoulders, aggravated by movement.  She has right arm numbness which is likely from the anesthesia block during the surgery.  Patient does not have chest pain or shortness of breath.  She has mild dry cough.  No nausea, vomiting, diarrhea or abdominal pain.  No symptoms of UTI.  Her last dialysis was on Tuesday.  Patient missed dialysis on Thursday Nephrology was consulted.  [facility] consulted for admission and further management as below.  Assessment and Plan: Fall: images showed no acute injury. Pt has severe neck pain. -Placed on head of bed for observation -Pain control: As needed Percocet, morphine, Tylenol -As needed Robaxin -fall precaution -consult TOC   Closed nondisplaced fracture of third cervical vertebra [facility]): pt is following up with Dr. Katrinka Blazing of neurosugery. MRI of C spin today showed slightly increased edema along the known C3-C4 osteophyte fracture line, which is not unexpected in the setting of interval healing. No evidence of ligamentous injury. -continue C collar -Follow-up with neurosurgeon   History of CVA (cerebral vascular accident) (HCC) -Patient was told to hold aspirin due to  procedure of AVF placement - Lipitor   Anemia in ESRD (end-stage renal disease) (HCC) -Follow-up with CBC   CAD (coronary artery disease) -Hold aspirin -Lipitor   Leukocytosis: Mild, WBC 10.9, likely reactive, no source of infection identified. 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 Cherylann Ratel of renal for dialysis   Type II diabetes mellitus with renal manifestations: Recent A1c 5.5, well-controlled.  Patient is taking NovoLog and as needed Lantus, currently not taking Lantus. -Sliding scale insulin   Body mass index is 22.67 kg/m.  Interventions:  Diet: Renal diet DVT Prophylaxis: Subcutaneous Heparin    Advance goals of care discussion: Full code  Family Communication: family was not present at bedside, at the time of interview.  The pt provided permission to discuss medical plan with the family. Opportunity was given to ask question and all questions were answered satisfactorily.   Disposition:  Pt is from home, admitted with fall and neck pain, still has severe neck pain, which precludes a safe discharge. Discharge to SNF TBD after PT/OT eval, when stable.  Subjective: No significant events overnight, patient was seen after hemodialysis.  Patient still has significant pain in the neck and right arm.  Denies any other complaints.  Physical Exam: General: NAD, lying comfortably Appear in no distress, affect appropriate Eyes: PERRLA ENT: Oral Mucosa Clear, moist,  Neck: no JVD, cervical collar attached Cardiovascular: S1 and S2 Present,  no Murmur,  Respiratory: good respiratory effort, Bilateral Air entry equal and Decreased, no Crackles, no wheezes Abdomen: Bowel Sound present, Soft and no tenderness,  Skin: no rashes Extremities: no Pedal edema, no calf tenderness Neurologic: without any new focal findings Gait not checked  due to patient safety concerns  Vitals:   05/18/23 1245 05/18/23 1300 05/18/23 1330 05/18/23 1530  BP: 136/67 111/60 131/60 (!) 118/57  Pulse: 94 93 81 83  Resp:   18 16  Temp:   98.3 F (36.8 C) 98.5 F (36.9 C)  TempSrc:   Oral Oral  SpO2: 100% 99% 100% 100%  Weight:      Height:        Intake/Output Summary (Last 24 hours) at 05/18/2023 1706 Last data filed at 05/18/2023 1330 Gross per 24 hour  Intake --  Output 900 ml  Net -900 ml   Filed Weights   05/17/23 1600  Weight: 79 kg    Data Reviewed: I have personally reviewed and interpreted daily labs, tele strips, imagings as discussed above. I reviewed all nursing notes, pharmacy notes, vitals, pertinent old records I have discussed plan of care as described above with RN and patient/family.  CBC: Recent Labs  Lab 05/15/23 1340 05/17/23 0825 05/17/23 1850 05/18/23 0600  WBC 11.1*  --  10.9* 9.8  NEUTROABS 8.4*  --  8.3*  --   HGB 11.7* 14.3 11.6* 10.4*  HCT 35.6* 42.0 35.6* 32.5*  MCV 84.8  --  86.2 86.2  PLT 286  --  261 227   Basic Metabolic Panel: Recent Labs  Lab 05/15/23 1340 05/17/23 0825 05/17/23 1850 05/18/23 0600  NA 135 138 136 139  K 2.8* 3.6 4.1 4.4  CL 93* 99 95* 97*  CO2 28  --  26 27  GLUCOSE 89 89 77 86  BUN 13 30* 35* 34*  CREATININE 3.21* 6.10* 6.23* 6.50*  CALCIUM 8.9  --  9.2 9.0    Studies: MR Cervical Spine Wo Contrast  Result Date: 05/17/2023 CLINICAL DATA:  Neck trauma, fall EXAM: MRI CERVICAL SPINE WITHOUT CONTRAST TECHNIQUE: Multiplanar, multisequence MR imaging of the cervical spine was performed. No intravenous contrast was administered. COMPARISON:  05/01/2023 MRI cervical spine, 05/17/2023 CT cervical spine FINDINGS: Alignment: Exaggeration of the normal cervical kyphosis. Vertebrae: Increased T2 hyperintense signal along the known C3-C4 osteophyte fracture line, which is not unexpected in the setting of interval healing. Redemonstrated flowing osteophytes. Vertebral  body heights are preserved. Cord: Normal signal and morphology. Posterior Fossa, vertebral arteries, paraspinal tissues: Persistent prevertebral edema anterior to the fracture site. No evidence of ligamentous injury. Disc levels: C2-C3: Disc osteophyte complex. No spinal canal stenosis or neural foraminal narrowing. C3-C4: Disc osteophyte complex. Facet and uncovertebral hypertrophy. Mild-to-moderate spinal canal stenosis. No significant neural foraminal narrowing. C4-C5: Disc osteophyte complex. No spinal canal stenosis or neural foraminal narrowing. C5-C6: Disc osteophyte complex. Facet and uncovertebral hypertrophy. Mild spinal canal stenosis and mild-to-moderate bilateral neural foraminal narrowing. C6-C7: Disc osteophyte complex. Facet and uncovertebral hypertrophy. No spinal canal stenosis. Moderate right neural foraminal narrowing. C7-T1: No significant disc bulge. No spinal canal stenosis or neuroforaminal narrowing. IMPRESSION: 1. Slightly increased edema along the known C3-C4 osteophyte fracture line, which is not unexpected in the setting of interval healing. No evidence of ligamentous injury. 2. Otherwise unchanged degenerative findings. Electronically Signed   By: Wiliam Ke M.D.   On: 05/17/2023 20:39   DG Ribs Unilateral W/Chest Right  Result Date: 05/17/2023 CLINICAL DATA:  Fall, landed  on right side. EXAM: RIGHT RIBS AND CHEST - 3+ VIEW COMPARISON:  None Available. FINDINGS: Right dialysis catheter in place with the tip at the cavoatrial junction. Heart and mediastinal contours are within normal limits. No focal opacities or effusions. No acute bony abnormality. No visible rib fracture. No pneumothorax. IMPRESSION: No active cardiopulmonary disease. Electronically Signed   By: Charlett Nose M.D.   On: 05/17/2023 17:46   DG Knee Complete 4 Views Left  Result Date: 05/17/2023 CLINICAL DATA:  Fall EXAM: LEFT KNEE - COMPLETE 4+ VIEW COMPARISON:  None Available. FINDINGS: Mild degenerative  changes in the left knee most pronounced in the patellofemoral compartment. No acute bony abnormality. Specifically, no fracture, subluxation, or dislocation. No joint effusion. IMPRESSION: No acute bony abnormality. Electronically Signed   By: Charlett Nose M.D.   On: 05/17/2023 17:45   DG Knee Complete 4 Views Right  Result Date: 05/17/2023 CLINICAL DATA:  Fall, landed on right side. EXAM: RIGHT KNEE - COMPLETE 4+ VIEW COMPARISON:  None Available. FINDINGS: Diffuse moderate to advanced degenerative disc disease throughout the right knee with joint space narrowing and spurring. No joint effusion. Chondrocalcinosis noted. No acute bony abnormality. Specifically, no fracture, subluxation, or dislocation. Soft tissues are intact. IMPRESSION: Diffuse moderate to advanced tricompartment degenerative changes. No acute bony abnormality. Electronically Signed   By: Charlett Nose M.D.   On: 05/17/2023 17:44   DG Shoulder Right  Result Date: 05/17/2023 CLINICAL DATA:  Fall, landed on right arm. EXAM: RIGHT SHOULDER - 2+ VIEW COMPARISON:  None Available. FINDINGS: Mild degenerative changes in the right AC and glenohumeral joints with joint space narrowing and spurring. No acute bony abnormality. Specifically, no fracture, subluxation, or dislocation. Right dialysis catheter noted with the tip at the cavoatrial junction. No visible rib fracture. Visualized right lung clear. IMPRESSION: No acute bony abnormality. Electronically Signed   By: Charlett Nose M.D.   On: 05/17/2023 17:44    Scheduled Meds:  atorvastatin  40 mg Oral Daily   Chlorhexidine Gluconate Cloth  6 each Topical Q0600   ferrous sulfate  325 mg Oral Q breakfast   furosemide  20 mg Oral Daily   gabapentin  300 mg Oral TID   heparin  5,000 Units Subcutaneous Q8H   insulin aspart  0-5 Units Subcutaneous QHS   insulin aspart  0-9 Units Subcutaneous TID WC   metoprolol tartrate  25 mg Oral BID   Continuous Infusions: PRN Meds: acetaminophen,  alteplase, heparin, hydrALAZINE, methocarbamol, morphine injection, ondansetron (ZOFRAN) IV, oxyCODONE-acetaminophen  Time spent: 35 minutes  Author: Gillis Santa. MD [facility] 05/18/2023 5:06 PM  To reach On-call, see care teams to locate the attending and reach out to them via [email or web address]. If 7PM-7AM, please contact night-coverage If you still have difficulty reaching the attending provider, please page the [facility] (Director on Call) for [facility] on amion for assistance.

## 2023-05-18 NOTE — ED Notes (Signed)
Pt tx to MRI

## 2023-05-19 DIAGNOSIS — I10 Essential (primary) hypertension: Secondary | ICD-10-CM | POA: Diagnosis not present

## 2023-05-19 DIAGNOSIS — E1122 Type 2 diabetes mellitus with diabetic chronic kidney disease: Secondary | ICD-10-CM

## 2023-05-19 DIAGNOSIS — W19XXXA Unspecified fall, initial encounter: Secondary | ICD-10-CM | POA: Diagnosis not present

## 2023-05-19 DIAGNOSIS — Z794 Long term (current) use of insulin: Secondary | ICD-10-CM

## 2023-05-19 DIAGNOSIS — S12201A Unspecified nondisplaced fracture of third cervical vertebra, initial encounter for closed fracture: Secondary | ICD-10-CM

## 2023-05-19 DIAGNOSIS — N186 End stage renal disease: Secondary | ICD-10-CM | POA: Diagnosis not present

## 2023-05-19 DIAGNOSIS — M542 Cervicalgia: Secondary | ICD-10-CM | POA: Diagnosis not present

## 2023-05-19 LAB — CBC
HCT: 30.1 % — ABNORMAL LOW (ref 36.0–46.0)
Hemoglobin: 9.7 g/dL — ABNORMAL LOW (ref 12.0–15.0)
MCH: 27.2 pg (ref 26.0–34.0)
MCHC: 32.2 g/dL (ref 30.0–36.0)
MCV: 84.6 fL (ref 80.0–100.0)
Platelets: 210 10*3/uL (ref 150–400)
RBC: 3.56 MIL/uL — ABNORMAL LOW (ref 3.87–5.11)
RDW: 14.6 % (ref 11.5–15.5)
WBC: 9.4 10*3/uL (ref 4.0–10.5)
nRBC: 0 % (ref 0.0–0.2)

## 2023-05-19 LAB — BASIC METABOLIC PANEL
Anion gap: 8 (ref 5–15)
BUN: 24 mg/dL — ABNORMAL HIGH (ref 8–23)
CO2: 29 mmol/L (ref 22–32)
Calcium: 8.5 mg/dL — ABNORMAL LOW (ref 8.9–10.3)
Chloride: 100 mmol/L (ref 98–111)
Creatinine, Ser: 4.61 mg/dL — ABNORMAL HIGH (ref 0.44–1.00)
GFR, Estimated: 10 mL/min — ABNORMAL LOW (ref >60)
Glucose, Bld: 89 mg/dL (ref 70–99)
Potassium: 3.6 mmol/L (ref 3.5–5.1)
Sodium: 137 mmol/L (ref 135–145)

## 2023-05-19 LAB — GLUCOSE, CAPILLARY
Glucose-Capillary: 113 mg/dL — ABNORMAL HIGH (ref 70–99)
Glucose-Capillary: 169 mg/dL — ABNORMAL HIGH (ref 70–99)
Glucose-Capillary: 108 mg/dL — ABNORMAL HIGH (ref 70–99)

## 2023-05-19 LAB — MAGNESIUM: Magnesium: 2 mg/dL (ref 1.7–2.4)

## 2023-05-19 LAB — PHOSPHORUS: Phosphorus: 5 mg/dL — ABNORMAL HIGH (ref 2.5–4.6)

## 2023-05-19 LAB — HEPATITIS B SURFACE ANTIBODY, QUANTITATIVE: Hep B S AB Quant (Post): 21.8 m[IU]/mL

## 2023-05-19 MED ORDER — INSULIN GLARGINE 100 UNIT/ML ~~LOC~~ SOLN: 10.0000 [IU] | Freq: Every day | SUBCUTANEOUS | 1 refills | Status: DC

## 2023-05-19 MED ORDER — GABAPENTIN 100 MG PO CAPS: 100.0000 mg | ORAL_CAPSULE | Freq: Two times a day (BID) | ORAL | 1 refills | Status: DC

## 2023-05-19 MED ORDER — HEPARIN SODIUM (PORCINE) 1000 UNIT/ML IJ SOLN
INTRAMUSCULAR | Status: AC
  Filled 2023-05-19: qty 10

## 2023-05-19 NOTE — Evaluation (Signed)
Physical Therapy Evaluation Patient Details Name: Deanna [name] MRN: [id number] DOB: November 01, 1950 Today's Date: 05/19/2023  History of Present Illness  Pt is a 72 y.o. female with medical history significant of ESRD-HD (TTS), HTN, H LD, DM, CAD, sCHF with EF 30%, stroke, anemia, recent C3 cervical fracture on C-collar, angioedema due to ACEI, who presents with fall and neck pain.  Clinical Impression   Pt is seen by OT and PT for a co-evaluation today. Pt is received in bed, she is agreeable to PT/OT session. Pt reports mild-mod pain "everywhere" at beginning of session and describes pain as "going in and out". Pt performs bed mobility mod A, transfers CGA-min A, and amb min A. Pt demonstrates improvements with transfers as seen by decreased assistance throughout session. Pt able to amb approx 8 ft using SPC on L side min A for safety. Pt demonstrates unsteadiness during standing activities and requires frequent cuing on AD sequencing due to using non-dominant UE for AD following fall and upright standing posture to reduce fw flexed trunk. Additionally, Pt demonstrated one instant of mild LOB during step pivot transfer to recliner due to reports of feeling she is about to fall. Pt would benefit from skilled PT to address above deficits and promote optimal return to PLOF.         If plan is discharge home, recommend the following: A little help with walking and/or transfers;A little help with bathing/dressing/bathroom;Help with stairs or ramp for entrance;Assist for transportation;Assistance with cooking/housework   Can travel by private vehicle   No    Equipment Recommendations None recommended by PT  Recommendations for Other Services       Functional Status Assessment Patient has had a recent decline in their functional status and demonstrates the ability to make significant improvements in function in a reasonable and predictable amount of time.     Precautions / Restrictions  Precautions Precautions: Fall;Other (comment) (c/s [place] J collar) Required Braces or Orthoses: Cervical Brace Cervical Brace: Other (comment) ([place] J collar) Restrictions Weight Bearing Restrictions: No      Mobility  Bed Mobility Overal bed mobility: Needs Assistance Bed Mobility: Supine to Sit     Supine to sit: Mod assist     General bed mobility comments: requires mod A x1 for scooting EOB and cuing for hand placement    Transfers Overall transfer level: Needs assistance Equipment used: Straight cane Transfers: Sit to/from Stand, Bed to chair/wheelchair/BSC Sit to Stand: Min assist   Step pivot transfers: Contact guard assist, Min assist, +2 physical assistance       General transfer comment: CGA-minA x2 for initial STS but able to progres to minA x1 throughout session    Ambulation/Gait Ambulation/Gait assistance: Min assist Gait Distance (Feet): 8 Feet Assistive device: Straight cane Gait Pattern/deviations: Step-to pattern, Decreased step length - right, Decreased step length - left, Decreased stride length, Trunk flexed Gait velocity: Decreased     General Gait Details: cuing for correct use of AD and promoting upright standing posture  Stairs            Wheelchair Mobility     Tilt Bed    Modified Rankin (Stroke Patients Only)       Balance Overall balance assessment: Needs assistance Sitting-balance support: Single extremity supported, Feet supported Sitting balance-[name]: Good Sitting balance - Comments: Able to maintain seated EOB balance during functional activities Postural control: Other (comment) (slouch posture) Standing balance support: Single extremity supported, During functional activity, Reliant on assistive device  for balance Standing balance-[name]: Fair Standing balance comment: prominent forward flex trunk posture with SPC on L side, mildly unsteady                             Pertinent  Vitals/Pain Pain Assessment Pain Assessment: Faces Faces Pain Scale: Hurts little more Pain Location: B shoulder Pain Descriptors / Indicators: Discomfort Pain Intervention(s): Monitored during session, Repositioned    Home Living Family/patient expects to be discharged to:: Private residence Living Arrangements: Non-relatives/Friends (care taker, tammy norman) Available Help at Discharge: Available PRN/intermittently;Personal care attendant Type of Home: House Home Access: Ramped entrance     Alternate Level Stairs-Number of Steps: pt lives with caregiver in her house per her report Home Layout: Two level Home Equipment: Educational psychologist (4 wheels);Cane - single Librarian, academic (2 wheels);Wheelchair - power Additional Comments: pt unsure she can live on main floor    Prior Function Prior Level of Function : Needs assist             Mobility Comments: amb short distances with [facility]       Extremity/Trunk Assessment   Upper Extremity Assessment Upper Extremity Assessment: Defer to OT evaluation    Lower Extremity Assessment Lower Extremity Assessment: Generalized weakness       Communication   Communication Communication: No apparent difficulties  Cognition Arousal: Alert Behavior During Therapy: WFL for tasks assessed/performed Overall Cognitive Status: Within Functional Limits for tasks assessed                                 General Comments: Pleasant and slightly impulsive but able to follow directions        General Comments General comments (skin integrity, edema, etc.): Assessed vitals throughout session and maintained WNL    Exercises     Assessment/Plan    PT Assessment Patient needs continued PT services  PT Problem List Decreased strength;Decreased mobility;Decreased activity tolerance;Decreased balance;Decreased safety awareness;Decreased coordination;Pain       PT Treatment Interventions Gait training;Functional  mobility training;Therapeutic activities;Therapeutic exercise;Balance training;DME instruction    PT Goals (Current goals can be found in the Care Plan section)  Acute Rehab PT Goals Patient Stated Goal: to get better PT Goal Formulation: With patient Time For Goal Achievement: 06/02/23 Potential to Achieve Goals: Good    Frequency Min 1X/week     Co-evaluation PT/OT/SLP Co-Evaluation/Treatment: Yes Reason for Co-Treatment: Complexity of the patient's impairments (multi-system involvement);Necessary to address cognition/behavior during functional activity;To address functional/ADL transfers PT goals addressed during session: Mobility/safety with mobility         AM-PAC PT "6 Clicks" Mobility  Outcome Measure Help needed turning from your back to your side while in a flat bed without using bedrails?: A Little Help needed moving from lying on your back to sitting on the side of a flat bed without using bedrails?: A Little Help needed moving to and from a bed to a chair (including a wheelchair)?: A Little Help needed standing up from a chair using your arms (e.g., wheelchair or bedside chair)?: A Little Help needed to walk in hospital room?: A Little Help needed climbing 3-5 steps with a railing? : A Lot 6 Click Score: 17    End of Session Equipment Utilized During Treatment: Gait belt;Cervical collar;Other (comment) (RUE arm sling) Activity Tolerance: Patient tolerated treatment well;No increased pain Patient left: in chair;with call bell/phone within  reach;with bed alarm set Nurse Communication: Mobility status PT Visit Diagnosis: Unsteadiness on feet (R26.81);Other abnormalities of gait and mobility (R26.89);Repeated falls (R29.6);History of falling (Z91.81);Difficulty in walking, not elsewhere classified (R26.2);Muscle weakness (generalized) (M62.81);Pain Pain - Right/Left: Right Pain - part of body: Knee (neck)    Time: 0932-3557 PT Time Calculation (min) (ACUTE ONLY): 26  min   Charges:                 Elmon Else, SPT   Marga Gramajo 05/19/2023, 3:30 PM

## 2023-05-19 NOTE — Progress Notes (Signed)
Hemodialysis note  Received patient in bed to unit. Alert and oriented.  Informed consent signed and in chart.  Treatment initiated: 0800 Treatment completed: 1122  Patient tolerated well. Transported back to room, alert without acute distress.  Report given to patient's RN.   Access used: Right Chest HD Catheter Access issues: none  Total UF removed: 500 ml Medication(s) given:  none  Post HD weight: 71.3 kg   [name]

## 2023-05-19 NOTE — Progress Notes (Signed)
PROGRESS NOTE  Deanna [name] HQI:[id number] DOB: Jan 01, 1951   PCP: [facility]  Patient is from: Home.  Lives alone.  Has caregiver  DOA: 05/17/2023 LOS: 0  Chief complaints Chief Complaint  Patient presents with   Fall     Brief Narrative / Interim history: 72 y.o. female with medical history significant of ESRD-HD (TTS), HTN, H LD, DM, CAD, sCHF with EF 30%, stroke, anemia, recent C3 cervical fracture on C-collar, angioedema due to ACEI, who presents with fall and neck pain.   Pt states she was on her way home after right arm AVF placement by Dr. Wyn Quaker when the wheelchair fell over. She states she landed on her R side and since then has had ongoing and worsening neck pain, R shoulder pain, and b/l knee pain.  Her neck pain is constant, severe, radiating to bilateral shoulders, aggravated by movement.  She has right arm numbness which is likely from the anesthesia block during the surgery.  Patient does not have chest pain or shortness of breath.  She has mild dry cough.  No nausea, vomiting, diarrhea or abdominal pain.  No symptoms of UTI.  Her last dialysis was on Tuesday.  Patient missed dialysis on Thursday. Nephrology was consulted.    In ED, CT head without acute finding.  CT cervical spine redemonstrated large flowing anterior osteophyte and fracture through the anterior bridging osteophyte at C3-4.  Right shoulder, right ribs and bilateral knee x-ray without acute finding.  MRI cervical spine with slightly increased edema along the known C3-4 osteophyte fracture line but no evidence of ligamentous injury.   Therapy recommended home health.  Likely discharged with home health PT/OT on 8/25.   Subjective: Seen and examined this evening after she returned from dialysis.  No major events overnight of this morning.  Reports severe neck pain.  She rates her pain 9/10 although she does not appear to be in that much distress.  Objective: Vitals:   05/19/23  1100 05/19/23 1122 05/19/23 1157 05/19/23 1615  BP: (!) 109/57 126/68 (!) 106/48 (!) 98/45  Pulse: 72 71 79 69  Resp: 17 (!) 24 16 16   Temp:  98.7 F (37.1 C) 99 F (37.2 C) 98.4 F (36.9 C)  TempSrc:  Oral Oral Oral  SpO2: 99% 100% 100% 100%  Weight:  71.3 kg    Height:        Examination:  GENERAL: No apparent distress.  Nontoxic. HEENT: MMM.  Vision and hearing grossly intact.  NECK: Neck collar in place. RESP:  No IWOB.  Fair aeration bilaterally. CVS:  RRR. Heart sounds normal.  ABD/GI/GU: BS+. Abd soft, NTND.  MSK/EXT:  Moves extremities.  No asymmetry.  Right arm in sling. SKIN: no apparent skin lesion or wound NEURO: Awake, alert and oriented appropriately.  No apparent focal neuro deficit. PSYCH: Calm. Normal affect.   Procedures:  None  Microbiology summarized: None  Assessment and plan: Principal Problem:   Fall Active Problems:   Closed nondisplaced fracture of third cervical vertebra (HCC)   CVA (cerebral vascular accident) (HCC)   Anemia in ESRD (end-stage renal disease) (HCC)   CAD (coronary artery disease)   Leukocytosis   Essential (primary) hypertension   End-stage renal disease on hemodialysis (HCC)   Type II diabetes mellitus with renal manifestations (HCC)  Accidental fall: CT head, CT cervical spine, MRI cervical spine, right shoulder x-ray, right rib x-ray and bilateral knee x-ray without acute fracture.  -Fall precaution, PT/OT eval -Minimize sedating  medications   Subacute closed nondisplaced fracture of third cervical vertebra:  pt is following up with Dr. Katrinka Blazing of [facility]. MRI of C spin today showed slightly increased edema along the known C3-C4 osteophyte fracture line, which is not unexpected in the setting of interval healing. No evidence of ligamentous injury. -Continue C collar -Follow-up with neurosurgeon as previously planned   History of CVA -Continue home meds.   ESRD on HD TTS Anemia in ESRD -Defer to  nephrology -Renally dose medications.   CAD: No cardiopulmonary symptoms. -Continue home meds   Leukocytosis: Mild and resolved.  Likely demargination.   Essential HTN: Normotensive -IV hydralazine as needed -Metoprolol   HLD: -Lipitor  Uncontrolled IDDM-2 with hyperglycemia: A1c 5.5% in 12/2021. Recent Labs  Lab 05/18/23 0824 05/18/23 1753 05/18/23 2130 05/19/23 1156 05/19/23 1614  GLUCAP 75 143* 86 113* 169*  -Decrease home insulin on discharge  Body mass index is 20.46 kg/m.          DVT prophylaxis:  heparin injection 5,000 Units Start: 05/17/23 2215  Code Status: Full code Family Communication: None at bedside Level of care: Med-Surg Status is: Observation The patient remains OBS appropriate and will d/c before 2 midnights.   Final disposition: Likely home the morning of 8/25.  Patient lives alone.  Has a caregiver during the day Consultants:  Nephrology  35 minutes with more than 50% spent in reviewing records, counseling patient/family and coordinating care.   Sch Meds:  Scheduled Meds:  atorvastatin  40 mg Oral Daily   Chlorhexidine Gluconate Cloth  6 each Topical Q0600   ferrous sulfate  325 mg Oral Q breakfast   furosemide  20 mg Oral Daily   gabapentin  300 mg Oral TID   heparin  5,000 Units Subcutaneous Q8H   insulin aspart  0-5 Units Subcutaneous QHS   insulin aspart  0-9 Units Subcutaneous TID WC   metoprolol tartrate  25 mg Oral BID   Continuous Infusions: PRN Meds:.acetaminophen, hydrALAZINE, methocarbamol, ondansetron (ZOFRAN) IV, oxyCODONE-acetaminophen  Antimicrobials: Anti-infectives (From admission, onward)    None        I have personally reviewed the following labs and images: CBC: Recent Labs  Lab 05/15/23 1340 05/17/23 0825 05/17/23 1850 05/18/23 0600 05/19/23 0414  WBC 11.1*  --  10.9* 9.8 9.4  NEUTROABS 8.4*  --  8.3*  --   --   HGB 11.7* 14.3 11.6* 10.4* 9.7*  HCT 35.6* 42.0 35.6* 32.5* 30.1*  MCV 84.8   --  86.2 86.2 84.6  PLT 286  --  261 227 210   BMP &GFR Recent Labs  Lab 05/15/23 1340 05/17/23 0825 05/17/23 1850 05/18/23 0600 05/19/23 0414  NA 135 138 136 139 137  K 2.8* 3.6 4.1 4.4 3.6  CL 93* 99 95* 97* 100  CO2 28  --  26 27 29   GLUCOSE 89 89 77 86 89  BUN 13 30* 35* 34* 24*  CREATININE 3.21* 6.10* 6.23* 6.50* 4.61*  CALCIUM 8.9  --  9.2 9.0 8.5*  MG  --   --   --   --  2.0  PHOS  --   --   --   --  5.0*   Estimated Creatinine Clearance: 12.6 mL/min (A) (by C-G formula based on SCr of 4.61 mg/dL (H)). Liver & Pancreas: No results for input(s): "AST", "ALT", "ALKPHOS", "BILITOT", "PROT", "ALBUMIN" in the last 168 hours. No results for input(s): "LIPASE", "AMYLASE" in the last 168 hours. No results for input(s): "  AMMONIA" in the last 168 hours. Diabetic: No results for input(s): "HGBA1C" in the last 72 hours. Recent Labs  Lab 05/18/23 0824 05/18/23 1753 05/18/23 2130 05/19/23 1156 05/19/23 1614  GLUCAP 75 143* 86 113* 169*   Cardiac Enzymes: No results for input(s): "CKTOTAL", "CKMB", "CKMBINDEX", "TROPONINI" in the last 168 hours. No results for input(s): "PROBNP" in the last 8760 hours. Coagulation Profile: No results for input(s): "INR", "PROTIME" in the last 168 hours. Thyroid Function Tests: No results for input(s): "TSH", "T4TOTAL", "FREET4", "T3FREE", "THYROIDAB" in the last 72 hours. Lipid Profile: No results for input(s): "CHOL", "HDL", "LDLCALC", "TRIG", "CHOLHDL", "LDLDIRECT" in the last 72 hours. Anemia Panel: Recent Labs    05/18/23 0600 05/18/23 1000  VITAMINB12  --  645  FOLATE 9.3  --    Urine analysis:    Component Value Date/Time   COLORURINE YELLOW (A) 12/26/2021 1621   APPEARANCEUR HAZY (A) 12/26/2021 1621   LABSPEC 1.011 12/26/2021 1621   PHURINE 5.0 12/26/2021 1621   GLUCOSEU 150 (A) 12/26/2021 1621   HGBUR SMALL (A) 12/26/2021 1621   BILIRUBINUR NEGATIVE 12/26/2021 1621   KETONESUR NEGATIVE 12/26/2021 1621   PROTEINUR 100  (A) 12/26/2021 1621   NITRITE NEGATIVE 12/26/2021 1621   LEUKOCYTESUR TRACE (A) 12/26/2021 1621   Sepsis Labs: Invalid input(s): "PROCALCITONIN", "LACTICIDVEN"  Microbiology: No results found for this or any previous visit (from the past 240 hour(s)).  Radiology Studies: No results found.    Alishba Naples T. Decorian Schuenemann [facility]  If 7PM-7AM, please contact night-coverage [email or web address] 05/19/2023, 5:17 PM

## 2023-05-19 NOTE — Progress Notes (Signed)
[facility]  ROUNDING NOTE   Subjective:   Deanna [name] is a 72 year old African-American female with a past medical history of coronary artery disease, status post PCI, CHF with reduced ejection fraction, CKD, diabetes mellitus, hypertension, and end stage renal disease on hemodialysis. Patient presents to the ED after suffering a fall from her rollator. She has been admitted under observation for Fall [W19.XXXA] H/O cervical fracture [Z87.81] Fall, initial encounter [W19.XXXA] Pain of right upper extremity [M79.601]  Patient is known to our practice and receives outpatient dialysis treatments at [facility] on a TTS schedule, supervised by Dr Cherylann Ratel.   Patient seen and evaluated during dialysis   HEMODIALYSIS FLOWSHEET:  Blood Flow Rate (mL/min): 400 mL/min Arterial Pressure (mmHg): -180 mmHg Venous Pressure (mmHg): 180 mmHg TMP (mmHg): 2 mmHg Ultrafiltration Rate (mL/min): 400 mL/min Dialysate Flow Rate (mL/min): 300 ml/min  Tolerating treatment well   Objective:  Vital signs in last 24 hours:  Temp:  [98.1 F (36.7 C)-98.8 F (37.1 C)] 98.8 F (37.1 C) (08/24 0738) Pulse Rate:  [70-94] 70 (08/24 1000) Resp:  [11-19] 13 (08/24 1000) BP: (73-148)/(52-73) 115/57 (08/24 1000) SpO2:  [99 %-100 %] 100 % (08/24 1000) Weight:  [71.8 kg-74.6 kg] 71.8 kg (08/24 0738)  Weight change: -4.4 kg Filed Weights   05/18/23 1752 05/19/23 0738  Weight: 74.6 kg 71.8 kg    Intake/Output: I/O last 3 completed shifts: In: -  Out: 900 [Other:900]   Intake/Output this shift:  No intake/output data recorded.  Physical Exam: General: NAD  Head: Normocephalic, atraumatic. Moist oral mucosal membranes, neck brace present  Eyes: Anicteric  Lungs:  Clear to auscultation, normal effort  Heart: Regular rate and rhythm  Abdomen:  Soft, nontender,   Extremities:  trace peripheral edema.  Neurologic: Nonfocal, moving all four extremities  Skin: No lesions  Access:  Rt permcath, AVF placed on 05/17/23    Basic Metabolic Panel: Recent Labs  Lab 05/15/23 1340 05/17/23 0825 05/17/23 1850 05/18/23 0600 05/19/23 0414  NA 135 138 136 139 137  K 2.8* 3.6 4.1 4.4 3.6  CL 93* 99 95* 97* 100  CO2 28  --  26 27 29   GLUCOSE 89 89 77 86 89  BUN 13 30* 35* 34* 24*  CREATININE 3.21* 6.10* 6.23* 6.50* 4.61*  CALCIUM 8.9  --  9.2 9.0 8.5*  MG  --   --   --   --  2.0  PHOS  --   --   --   --  5.0*    Liver Function Tests: No results for input(s): "AST", "ALT", "ALKPHOS", "BILITOT", "PROT", "ALBUMIN" in the last 168 hours. No results for input(s): "LIPASE", "AMYLASE" in the last 168 hours. No results for input(s): "AMMONIA" in the last 168 hours.  CBC: Recent Labs  Lab 05/15/23 1340 05/17/23 0825 05/17/23 1850 05/18/23 0600 05/19/23 0414  WBC 11.1*  --  10.9* 9.8 9.4  NEUTROABS 8.4*  --  8.3*  --   --   HGB 11.7* 14.3 11.6* 10.4* 9.7*  HCT 35.6* 42.0 35.6* 32.5* 30.1*  MCV 84.8  --  86.2 86.2 84.6  PLT 286  --  261 227 210    Cardiac Enzymes: No results for input(s): "CKTOTAL", "CKMB", "CKMBINDEX", "TROPONINI" in the last 168 hours.  BNP: Invalid input(s): "POCBNP"  CBG: Recent Labs  Lab 05/17/23 1225 05/17/23 2213 05/18/23 0824 05/18/23 1753 05/18/23 2130  GLUCAP 80 115* 75 143* 86    Microbiology: Results for orders placed or  performed during the hospital encounter of 01/20/22  Blood culture (routine x 2)     Status: None   Collection Time: 01/20/22  7:22 PM   Specimen: BLOOD  Result Value Ref Range Status   Specimen Description BLOOD BLOOD RIGHT HAND  Final   Special Requests   Final    BOTTLES DRAWN AEROBIC AND ANAEROBIC Blood Culture results may not be optimal due to an inadequate volume of blood received in culture bottles   Culture   Final    NO GROWTH 5 DAYS Performed at [facility], [address] [place], [place] [address]    Report Status 01/25/2022 FINAL  Final  Blood culture (routine x 2)      Status: None   Collection Time: 01/20/22  7:22 PM   Specimen: BLOOD  Result Value Ref Range Status   Specimen Description BLOOD BLOOD LEFT HAND  Final   Special Requests   Final    BOTTLES DRAWN AEROBIC AND ANAEROBIC Blood Culture results may not be optimal due to an inadequate volume of blood received in culture bottles   Culture   Final    NO GROWTH 5 DAYS Performed at [facility], [address] [place], [place] [address]    Report Status 01/25/2022 FINAL  Final  Resp Panel by RT-PCR (Flu A&B, Covid) Nasopharyngeal Swab     Status: None   Collection Time: 01/20/22  7:28 PM   Specimen: Nasopharyngeal Swab; Nasopharyngeal(NP) swabs in vial transport medium  Result Value Ref Range Status   SARS Coronavirus 2 by RT PCR NEGATIVE NEGATIVE Final    Comment: (NOTE) SARS-CoV-2 target nucleic acids are NOT DETECTED.  The SARS-CoV-2 RNA is generally detectable in upper respiratory specimens during the acute phase of infection. The lowest concentration of SARS-CoV-2 viral copies this assay can detect is 138 copies/mL. A negative result does not preclude SARS-Cov-2 infection and should not be used as the sole basis for treatment or other patient management decisions. A negative result may occur with  improper specimen collection/handling, submission of specimen other than nasopharyngeal swab, presence of viral mutation(s) within the areas targeted by this assay, and inadequate number of viral copies(<138 copies/mL). A negative result must be combined with clinical observations, patient history, and epidemiological information. The expected result is Negative.  Fact Sheet for Patients:  BloggerCourse.com  Fact Sheet for Healthcare Providers:  SeriousBroker.it  This test is no t yet approved or cleared by the [place] FDA and  has been authorized for detection and/or diagnosis of SARS-CoV-2 by FDA under an Emergency Use  Authorization (EUA). This EUA will remain  in effect (meaning this test can be used) for the duration of the COVID-19 declaration under Section 564(b)(1) of the Act, 21 U.S.C.section 360bbb-3(b)(1), unless the authorization is terminated  or revoked sooner.       Influenza A by PCR NEGATIVE NEGATIVE Final   Influenza B by PCR NEGATIVE NEGATIVE Final    Comment: (NOTE) The Xpert Xpress SARS-CoV-2/FLU/RSV plus assay is intended as an aid in the diagnosis of influenza from Nasopharyngeal swab specimens and should not be used as a sole basis for treatment. Nasal washings and aspirates are unacceptable for Xpert Xpress SARS-CoV-2/FLU/RSV testing.  Fact Sheet for Patients: BloggerCourse.com  Fact Sheet for Healthcare Providers: SeriousBroker.it  This test is not yet approved or cleared by the [place] FDA and has been authorized for detection and/or diagnosis of SARS-CoV-2 by FDA under an Emergency Use Authorization (EUA). This EUA will  remain in effect (meaning this test can be used) for the duration of the COVID-19 declaration under Section 564(b)(1) of the Act, 21 U.S.C. section 360bbb-3(b)(1), unless the authorization is terminated or revoked.  Performed at [facility], [address] [place], [place] [address]     Coagulation Studies: No results for input(s): "LABPROT", "INR" in the last 72 hours.  Urinalysis: No results for input(s): "COLORURINE", "LABSPEC", "PHURINE", "GLUCOSEU", "HGBUR", "BILIRUBINUR", "KETONESUR", "PROTEINUR", "UROBILINOGEN", "NITRITE", "LEUKOCYTESUR" in the last 72 hours.  Invalid input(s): "APPERANCEUR"    Imaging: MR Cervical Spine Wo Contrast  Result Date: 05/17/2023 CLINICAL DATA:  Neck trauma, fall EXAM: MRI CERVICAL SPINE WITHOUT CONTRAST TECHNIQUE: Multiplanar, multisequence MR imaging of the cervical spine was performed. No intravenous contrast was administered. COMPARISON:   05/01/2023 MRI cervical spine, 05/17/2023 CT cervical spine FINDINGS: Alignment: Exaggeration of the normal cervical kyphosis. Vertebrae: Increased T2 hyperintense signal along the known C3-C4 osteophyte fracture line, which is not unexpected in the setting of interval healing. Redemonstrated flowing osteophytes. Vertebral body heights are preserved. Cord: Normal signal and morphology. Posterior Fossa, vertebral arteries, paraspinal tissues: Persistent prevertebral edema anterior to the fracture site. No evidence of ligamentous injury. Disc levels: C2-C3: Disc osteophyte complex. No spinal canal stenosis or neural foraminal narrowing. C3-C4: Disc osteophyte complex. Facet and uncovertebral hypertrophy. Mild-to-moderate spinal canal stenosis. No significant neural foraminal narrowing. C4-C5: Disc osteophyte complex. No spinal canal stenosis or neural foraminal narrowing. C5-C6: Disc osteophyte complex. Facet and uncovertebral hypertrophy. Mild spinal canal stenosis and mild-to-moderate bilateral neural foraminal narrowing. C6-C7: Disc osteophyte complex. Facet and uncovertebral hypertrophy. No spinal canal stenosis. Moderate right neural foraminal narrowing. C7-T1: No significant disc bulge. No spinal canal stenosis or neuroforaminal narrowing. IMPRESSION: 1. Slightly increased edema along the known C3-C4 osteophyte fracture line, which is not unexpected in the setting of interval healing. No evidence of ligamentous injury. 2. Otherwise unchanged degenerative findings. Electronically Signed   By: Wiliam Ke M.D.   On: 05/17/2023 20:39   DG Ribs Unilateral W/Chest Right  Result Date: 05/17/2023 CLINICAL DATA:  Fall, landed on right side. EXAM: RIGHT RIBS AND CHEST - 3+ VIEW COMPARISON:  None Available. FINDINGS: Right dialysis catheter in place with the tip at the cavoatrial junction. Heart and mediastinal contours are within normal limits. No focal opacities or effusions. No acute bony abnormality. No visible  rib fracture. No pneumothorax. IMPRESSION: No active cardiopulmonary disease. Electronically Signed   By: Charlett Nose M.D.   On: 05/17/2023 17:46   DG Knee Complete 4 Views Left  Result Date: 05/17/2023 CLINICAL DATA:  Fall EXAM: LEFT KNEE - COMPLETE 4+ VIEW COMPARISON:  None Available. FINDINGS: Mild degenerative changes in the left knee most pronounced in the patellofemoral compartment. No acute bony abnormality. Specifically, no fracture, subluxation, or dislocation. No joint effusion. IMPRESSION: No acute bony abnormality. Electronically Signed   By: Charlett Nose M.D.   On: 05/17/2023 17:45   DG Knee Complete 4 Views Right  Result Date: 05/17/2023 CLINICAL DATA:  Fall, landed on right side. EXAM: RIGHT KNEE - COMPLETE 4+ VIEW COMPARISON:  None Available. FINDINGS: Diffuse moderate to advanced degenerative disc disease throughout the right knee with joint space narrowing and spurring. No joint effusion. Chondrocalcinosis noted. No acute bony abnormality. Specifically, no fracture, subluxation, or dislocation. Soft tissues are intact. IMPRESSION: Diffuse moderate to advanced tricompartment degenerative changes. No acute bony abnormality. Electronically Signed   By: Charlett Nose M.D.   On: 05/17/2023 17:44   DG Shoulder Right  Result  Date: 05/17/2023 CLINICAL DATA:  Fall, landed on right arm. EXAM: RIGHT SHOULDER - 2+ VIEW COMPARISON:  None Available. FINDINGS: Mild degenerative changes in the right AC and glenohumeral joints with joint space narrowing and spurring. No acute bony abnormality. Specifically, no fracture, subluxation, or dislocation. Right dialysis catheter noted with the tip at the cavoatrial junction. No visible rib fracture. Visualized right lung clear. IMPRESSION: No acute bony abnormality. Electronically Signed   By: Charlett Nose M.D.   On: 05/17/2023 17:44   CT CERVICAL SPINE WO CONTRAST  Result Date: 05/17/2023 CLINICAL DATA:  Head trauma, moderate-severe.  Fall. EXAM: CT  CERVICAL SPINE WITHOUT CONTRAST TECHNIQUE: Multidetector CT imaging of the cervical spine was performed without intravenous contrast. Multiplanar CT image reconstructions were also generated. RADIATION DOSE REDUCTION: This exam was performed according to the departmental dose-optimization program which includes automated exposure control, adjustment of the mA and/or kV according to patient size and/or use of iterative reconstruction technique. COMPARISON:  05/01/2023 CT and MRI FINDINGS: Alignment: Normal Skull base and vertebrae: Again noted are the large flowing bridging anterior osteophytes throughout the cervical and visualized upper thoracic spine compatible with diffuse idiopathic skeletal hyperostosis. Again noted is the fracture through the anterior bridging osteophyte at C3-4. Fracture line is slightly more evident on today's study, likely related to bone resorption in the interim. No new fracture. Soft tissues and spinal canal: No prevertebral fluid or swelling. No visible canal hematoma. Disc levels: Diffuse disc space narrowing with large anterior flowing osteophytes. Mild bilateral degenerative facet disease diffusely. No visible disc herniation. Upper chest: No acute findings Other: None IMPRESSION: Again noted are the large flowing anterior osteophytes compatible with diffuse idiopathic skeletal hyperostosis. Fracture again seen through the anterior bridging osteophyte at C3-4. Fracture line slightly more evident on today's study compatible with interval bone resorption. No new fracture or malalignment. Electronically Signed   By: Charlett Nose M.D.   On: 05/17/2023 17:43   CT HEAD WO CONTRAST ( )  Result Date: 05/17/2023 CLINICAL DATA:  Head trauma, moderate-severe.  Fall. EXAM: CT HEAD WITHOUT CONTRAST TECHNIQUE: Contiguous axial images were obtained from the base of the skull through the vertex without intravenous contrast. RADIATION DOSE REDUCTION: This exam was performed according to the  departmental dose-optimization program which includes automated exposure control, adjustment of the mA and/or kV according to patient size and/or use of iterative reconstruction technique. COMPARISON:  05/01/2023 CT FINDINGS: Brain: There is atrophy and chronic small vessel disease changes. No acute intracranial abnormality. Specifically, no hemorrhage, hydrocephalus, mass lesion, acute infarction, or significant intracranial injury. Vascular: No hyperdense vessel or unexpected calcification. Skull: No acute calvarial abnormality. Sinuses/Orbits: No acute findings Other: None IMPRESSION: Atrophy, chronic microvascular disease. No acute intracranial abnormality. Electronically Signed   By: Charlett Nose M.D.   On: 05/17/2023 17:38     Medications:     atorvastatin  40 mg Oral Daily   Chlorhexidine Gluconate Cloth  6 each Topical Q0600   ferrous sulfate  325 mg Oral Q breakfast   furosemide  20 mg Oral Daily   gabapentin  300 mg Oral TID   heparin  5,000 Units Subcutaneous Q8H   insulin aspart  0-5 Units Subcutaneous QHS   insulin aspart  0-9 Units Subcutaneous TID WC   metoprolol tartrate  25 mg Oral BID   acetaminophen, alteplase, heparin, hydrALAZINE, methocarbamol, ondansetron (ZOFRAN) IV, oxyCODONE-acetaminophen  Assessment/ Plan:  Ms. Sande Keats is a 72 y.o.  female with a past medical history of coronary artery  disease, status post PCI, CHF with reduced ejection fraction, CKD, diabetes mellitus, hypertension, and end stage renal disease on hemodialysis. Patient presents to the ED after suffering a fall from her rollator. She has been admitted under observation for Fall [W19.XXXA] H/O cervical fracture [Z87.81] Fall, initial encounter [W19.XXXA] Pain of right upper extremity [M79.601]  [facility] [place]/TTS/Rt permcath/72.0 kg   End stage renal disease on hemodialysis. Received dialysis yesterday, UF reduced to due to hypotension. Receiving treatment today to maintain  outpatient schedule.   2. Anemia of chronic kidney disease  Lab Results  Component Value Date   HGB 9.7 (L) 05/19/2023    Hgb at optimal range. Patient received Mircera at outpatient clinic. Will monitor for need of ESA during this admission.    3. Secondary Hyperparathyroidism: with outpatient labs: PTH 656, phosphorus 7.1, calcium 9.2 on 05/08/23.    Lab Results  Component Value Date   PTH 396 (H) 01/08/2022   CALCIUM 8.5 (L) 05/19/2023   CAION 1.15 05/17/2023   PHOS 5.0 (H) 05/19/2023    Cinacalcet, sevelamer and calcitriol ordered outpatient. Will continue to monitor bone minerals during this admission  4. Hypertension with chronic kidney disease. Home regimen includes furosemide and metoprolol.   Blood pressure stable during dialysis   LOS: 0 Pretty Weltman 8/24/202410:08 AM

## 2023-05-19 NOTE — Evaluation (Signed)
Occupational Therapy Evaluation Patient Details Name: Deanna [name] MRN: [id number] DOB: 12-10-1950 Today's Date: 05/19/2023   History of Present Illness Pt is a 72 year old female presenting to ED after fall, imaging showing no acute injury    PMH significant for  ESRD-HD (TTS), HTN, H LD, DM, CAD, sCHF with EF 30%, stroke, anemia, recent C3 cervical fracture on C-collar, angioedema due to ACEI   Clinical Impression   Chart reviewed, pt is greeted in bed, alert and oriented x4, agreeable to OT evaluation. PTA pt reports she lives in a house with a caregiver with a ramped entrance, but pt has to climb to second floor because chair lift is broken. Pt bedroom/bathroom is on the second floor. Pt reports she completes ADLs with MOD I, recent difficulties with LB dressing; pt amb with SPC with multiple reported falls. Pt presents with deficits in strength, endurance, activity tolerance, balance all affecting safe and optimal ADL completion. Pt will benefit from ongoing OT to address functional deficits and to improve safe, ADL completion. OT will follow acutely.       If plan is discharge home, recommend the following: A little help with walking and/or transfers;A little help with bathing/dressing/bathroom;Assistance with cooking/housework;Assist for transportation;Help with stairs or ramp for entrance    Functional Status Assessment  Patient has had a recent decline in their functional status and demonstrates the ability to make significant improvements in function in a reasonable and predictable amount of time.  Equipment Recommendations  BSC/3in1    Recommendations for Other Services       Precautions / Restrictions Precautions Precautions: Fall Precaution Comments: right arm AVF placement 8/22, per Dr Wyn Quaker sling for comfort, can put weight on it without restrictions; Required Braces or Orthoses: Cervical Brace (per neurosurgery note 8/14 Will plan on keeping this for total of 2 to 3  months.) Cervical Brace: Other (comment) ([place]) Restrictions Weight Bearing Restrictions: No      Mobility Bed Mobility Overal bed mobility: Needs Assistance Bed Mobility: Supine to Sit     Supine to sit: Mod assist          Transfers Overall transfer level: Needs assistance Equipment used: Straight cane Transfers: Sit to/from Stand, Bed to chair/wheelchair/BSC Sit to Stand: Min assist     Step pivot transfers: Contact guard assist, Min assist, +2 physical assistance (progress to +1)            Balance Overall balance assessment: Needs assistance Sitting-balance support: Single extremity supported, Feet supported Sitting balance-[name]: Good     Standing balance support: Single extremity supported, During functional activity, Reliant on assistive device for balance Standing balance-[name]: Fair Standing balance comment: frequent vcs for improved upright posture                           ADL either performed or assessed with clinical judgement   ADL Overall ADL's : Needs assistance/impaired Eating/Feeding: Set up;Sitting   Grooming: Wash/dry face;Sitting;Set up           Upper Body Dressing : Minimal assistance;Sitting Upper Body Dressing Details (indicate cue type and reason): adjust mi[place] j, sling for comfort Lower Body Dressing: Maximal assistance   Toilet Transfer: Minimal assistance;Ambulation Toilet Transfer Details (indicate cue type and reason): + 2 for chair follow, with cane in LUE Toileting- Clothing Manipulation and Hygiene: Moderate assistance Toileting - Clothing Manipulation Details (indicate cue type and reason): anticipate     Functional mobility  during ADLs: Minimal assistance;+2 for safety/equipment;Cueing for safety;Cueing for sequencing;Cane (approx 8' in room)       Vision Patient Visual Report: No change from baseline       Perception         Praxis         Pertinent Vitals/Pain Pain  Assessment Pain Assessment: 0-10 Pain Score: 8  Pain Location: B shoulders Pain Descriptors / Indicators: Discomfort Pain Intervention(s): Monitored during session, Repositioned, Premedicated before session     Extremity/Trunk Assessment Upper Extremity Assessment Upper Extremity Assessment: Generalized weakness   Lower Extremity Assessment Lower Extremity Assessment: Generalized weakness   Cervical / Trunk Assessment Cervical / Trunk Assessment: Kyphotic (in [place] J)   Communication Communication Communication: No apparent difficulties   Cognition Arousal: Alert Behavior During Therapy: WFL for tasks assessed/performed Overall Cognitive Status: No family/caregiver present to determine baseline cognitive functioning Area of Impairment: Safety/judgement                         Safety/Judgement: Decreased awareness of deficits           General Comments  vss throughout    Exercises Other Exercises Other Exercises: edu re: role of OT, role of rehab, discharge recommendations, home safety, DME use   Shoulder Instructions      Home Living Family/patient expects to be discharged to:: Private residence Living Arrangements: Non-relatives/Friends (care taker, tammy norman) Available Help at Discharge: Available PRN/intermittently;Personal care attendant Type of Home: House Home Access: Ramped entrance     Home Layout: Two level Alternate Level Stairs-Number of Steps: pt lives with caregiver in her house per her report Alternate Level Stairs-Rails: Left Bathroom Shower/Tub: Producer, television/film/video: Standard     Home Equipment: Educational psychologist (4 wheels);Cane - single Librarian, academic (2 wheels);Wheelchair - power   Additional Comments: pt unsure she can live on main floor      Prior Functioning/Environment Prior Level of Function : Needs assist       Physical Assist : ADLs (physical);Mobility (physical)   ADLs (physical):  IADLs Mobility Comments: amb short distances with SPC ADLs Comments: MOD I in feeding, grooming; Pt reports she uses walk in shower with seat with MOD I; increased difficulty with LB dressing with recent use of c collar; caregiver will cook every once in awhile but pt reports she preps simple meals (sandwhiches), takes transport to appts        OT Problem List: Decreased strength;Impaired balance (sitting and/or standing);Decreased activity tolerance;Decreased knowledge of use of DME or AE;Decreased safety awareness      OT Treatment/Interventions: Self-care/ADL training;Therapeutic exercise;Patient/family education;Balance training;Energy conservation;Therapeutic activities;DME and/or AE instruction    OT Goals(Current goals can be found in the care plan section) Acute Rehab OT Goals Patient Stated Goal: get stronger OT Goal Formulation: With patient Time For Goal Achievement: 06/02/23 Potential to Achieve Goals: Good ADL Goals Pt Will Perform Grooming: with modified independence;sitting Pt Will Perform Lower Body Dressing: with modified independence;sitting/lateral leans Pt Will Transfer to Toilet: with modified independence;ambulating Pt Will Perform Toileting - Clothing Manipulation and hygiene: with modified independence;sit to/from stand  OT Frequency: Min 1X/week    Co-evaluation PT/OT/SLP Co-Evaluation/Treatment: Yes Reason for Co-Treatment: Complexity of the patient's impairments (multi-system involvement);Necessary to address cognition/behavior during functional activity;To address functional/ADL transfers PT goals addressed during session: Mobility/safety with mobility OT goals addressed during session: ADL's and self-care      AM-PAC OT "6 Clicks" Daily Activity  Outcome Measure Help from another person eating meals?: None Help from another person taking care of personal grooming?: None Help from another person toileting, which includes using toliet, bedpan, or  urinal?: A Little Help from another person bathing (including washing, rinsing, drying)?: A Little Help from another person to put on and taking off regular upper body clothing?: A Little Help from another person to put on and taking off regular lower body clothing?: A Lot 6 Click Score: 19   End of Session Equipment Utilized During Treatment: Other (comment) (SPC, RUE sling) Nurse Communication: Mobility status  Activity Tolerance: Patient tolerated treatment well Patient left: in chair;with call bell/phone within reach;with chair alarm set  OT Visit Diagnosis: Unsteadiness on feet (R26.81);Repeated falls (R29.6)                Time: 6295-2841 OT Time Calculation (min): 38 min Charges:  OT General Charges $OT Visit: 1 Visit OT Evaluation $OT Eval Moderate Complexity: 1 Mod  Oleta Mouse, OTD OTR/L  05/19/23, 3:58 PM

## 2023-05-19 NOTE — Progress Notes (Signed)
OT Cancellation Note  Patient Details Name: Deanna [name] MRN: [id number] DOB: 1951-03-04   Cancelled Treatment:    Reason Eval/Treat Not Completed: Patient at procedure or test/ unavailable (pt is off the floor at HD, OT will reattempt as able) Oleta Mouse, OTD OTR/L  05/19/23, 8:13 AM

## 2023-05-19 NOTE — Progress Notes (Signed)
PT Cancellation Note  Patient Details Name: Deanna [name] MRN: [id number] DOB: 02-06-1951   Cancelled Treatment:     Pt is in hemodialysis this morning and not available for PT at this time. Will attempt to eval PT at a later time.   Zaina Jenkin 05/19/2023, 7:45 AM

## 2023-05-20 DIAGNOSIS — M542 Cervicalgia: Secondary | ICD-10-CM | POA: Diagnosis not present

## 2023-05-20 DIAGNOSIS — N186 End stage renal disease: Secondary | ICD-10-CM | POA: Diagnosis not present

## 2023-05-20 DIAGNOSIS — W19XXXA Unspecified fall, initial encounter: Secondary | ICD-10-CM | POA: Diagnosis not present

## 2023-05-20 LAB — GLUCOSE, CAPILLARY
Glucose-Capillary: 124 mg/dL — ABNORMAL HIGH (ref 70–99)
Glucose-Capillary: 122 mg/dL — ABNORMAL HIGH (ref 70–99)

## 2023-05-20 NOTE — Plan of Care (Signed)

## 2023-05-20 NOTE — Discharge Summary (Signed)
Physician Discharge Summary   Deanna [name]  female DOB: April 11, 1951  XBJ:[id number]  PCP: [facility]  Admit date: 05/17/2023 Discharge date: 05/20/2023  Admitted From: home Disposition:  home.  Refused SNF rehab. Home Health: Yes CODE STATUS: Full code  Discharge Instructions     Diet - low sodium heart healthy   Complete by: As directed    Diet Carb Modified   Complete by: As directed       Hospital Course:  For full details, please see H&P, progress notes, consult notes and ancillary notes.  Briefly,  Deanna [name] is a 72 y.o. female with medical history significant of ESRD-HD (TTS), HTN, DM2, CAD, sCHF with EF 30%, stroke, anemia, recent C3 cervical fracture on C-collar, who presented with fall and neck pain.   Pt states she was on her way home after right arm AVF placement by Dr. Wyn Quaker when the wheelchair fell over. She states she landed on her R side and since then has had ongoing and worsening neck pain, R shoulder pain, and b/l knee pain.   Accidental fall:  CT head without acute finding.  CT cervical spine redemonstrated large flowing anterior osteophyte and fracture through the anterior bridging osteophyte at C3-4.  Right shoulder, right ribs and bilateral knee x-ray without acute finding.  MRI cervical spine with slightly increased edema along the known C3-4 osteophyte fracture line but no evidence of ligamentous injury.  --PT/OT rec SNF rehab, however, pt chose to go home with [facility].   Subacute closed nondisplaced fracture of third cervical vertebra:   pt is following up with Dr. Katrinka Blazing of neurosugery. MRI of C spin showed slightly increased edema along the known C3-C4 osteophyte fracture line, which is not unexpected in the setting of interval healing. No evidence of ligamentous injury. -Continue C collar -Follow-up with neurosurgeon as previously planned   History of CVA -Continue home ASA and statin.   ESRD on HD TTS  CAD:   -Continue home ASA and statin.   Leukocytosis: Mild and resolved.     Essential HTN:  --cont Lasix and Lopressor   HLD: -Lipitor   DM2 A1c 5.5% in 12/2021.   --BG have been 70's to 120's without needing any long-acting insulin --Pt not taking Novolog PTA --d/c home Lantus   Discharge Diagnoses:  Principal Problem:   Fall Active Problems:   Closed nondisplaced fracture of third cervical vertebra (HCC)   CVA (cerebral vascular accident) (HCC)   Anemia in ESRD (end-stage renal disease) (HCC)   CAD (coronary artery disease)   Leukocytosis   Essential (primary) hypertension   End-stage renal disease on hemodialysis (HCC)   Type II diabetes mellitus with renal manifestations [facility])     Discharge Instructions:  Allergies as of 05/20/2023       Reactions   Ace Inhibitors Swelling   Entresto [sacubitril-valsartan] Swelling   Patient said she took both lisinopril and Entresto so it is not clear which one she actually reacted to.  For safety reasons, she has been advised to avoid both ACE inhibitors and Entresto.   Lisinopril Swelling   Severe Angioedema (requiring Nasotracheal intubation)   Penicillins Rash   Tomato Rash        Medication List     STOP taking these medications    insulin aspart 100 UNIT/ML injection Commonly known as: novoLOG   insulin glargine 100 UNIT/ML injection Commonly known as: LANTUS   potassium chloride SA 20 MEQ tablet Commonly known as: Jerene Dilling  TAKE these medications    aspirin EC 81 MG tablet Take 81 mg by mouth daily.   atorvastatin 40 MG tablet Commonly known as: LIPITOR Take 40 mg by mouth daily.   ferrous sulfate 325 (65 FE) MG EC tablet Take 325 mg by mouth in the morning.   furosemide 20 MG tablet Commonly known as: LASIX Take 20 mg by mouth daily.   gabapentin 100 MG capsule Commonly known as: NEURONTIN Take 1 capsule (100 mg total) by mouth 2 (two) times daily. What changed:  medication  strength how much to take when to take this   metFORMIN 500 MG tablet Commonly known as: GLUCOPHAGE Take 500 mg by mouth 2 (two) times daily with a meal.   metoprolol tartrate 25 MG tablet Commonly known as: LOPRESSOR Take 1 tablet (25 mg total) by mouth 2 (two) times daily.   oxyCODONE-acetaminophen 5-325 MG tablet Commonly known as: Percocet Take 1 tablet by mouth every 4 (four) hours as needed for severe pain.   sevelamer 800 MG tablet Commonly known as: RENAGEL Take 1,600 mg by mouth 3 (three) times daily with meals.   Vitamin D (Ergocalciferol) 1.25 MG (50000 UNIT) Caps capsule Commonly known as: DRISDOL Take 50,000 Units by mouth once a week. Wednesday               Durable Medical Equipment  (From admission, onward)           Start     Ordered   05/19/23 1733  For home use only DME Bedside commode  Once       Question:  Patient needs a bedside commode to treat with the following condition  Answer:  Generalized weakness   05/19/23 1732             Follow-up Information     [facility] Follow up in 1 week(s).   Specialty: General Practice Contact information: [address] [place] [address] [phone]                 Allergies  Allergen Reactions   Ace Inhibitors Swelling   Entresto [Sacubitril-Valsartan] Swelling    Patient said she took both lisinopril and Entresto so it is not clear which one she actually reacted to.  For safety reasons, she has been advised to avoid both ACE inhibitors and Entresto.   Lisinopril Swelling    Severe Angioedema (requiring Nasotracheal intubation)   Penicillins Rash   Tomato Rash     The results of significant diagnostics from this hospitalization (including imaging, microbiology, ancillary and laboratory) are listed below for reference.   Consultations:   Procedures/Studies: MR Cervical Spine Wo Contrast  Result Date: 05/17/2023 CLINICAL DATA:   Neck trauma, fall EXAM: MRI CERVICAL SPINE WITHOUT CONTRAST TECHNIQUE: Multiplanar, multisequence MR imaging of the cervical spine was performed. No intravenous contrast was administered. COMPARISON:  05/01/2023 MRI cervical spine, 05/17/2023 CT cervical spine FINDINGS: Alignment: Exaggeration of the normal cervical kyphosis. Vertebrae: Increased T2 hyperintense signal along the known C3-C4 osteophyte fracture line, which is not unexpected in the setting of interval healing. Redemonstrated flowing osteophytes. Vertebral body heights are preserved. Cord: Normal signal and morphology. Posterior Fossa, vertebral arteries, paraspinal tissues: Persistent prevertebral edema anterior to the fracture site. No evidence of ligamentous injury. Disc levels: C2-C3: Disc osteophyte complex. No spinal canal stenosis or neural foraminal narrowing. C3-C4: Disc osteophyte complex. Facet and uncovertebral hypertrophy. Mild-to-moderate spinal canal stenosis. No significant neural foraminal narrowing. C4-C5: Disc osteophyte  complex. No spinal canal stenosis or neural foraminal narrowing. C5-C6: Disc osteophyte complex. Facet and uncovertebral hypertrophy. Mild spinal canal stenosis and mild-to-moderate bilateral neural foraminal narrowing. C6-C7: Disc osteophyte complex. Facet and uncovertebral hypertrophy. No spinal canal stenosis. Moderate right neural foraminal narrowing. C7-T1: No significant disc bulge. No spinal canal stenosis or neuroforaminal narrowing. IMPRESSION: 1. Slightly increased edema along the known C3-C4 osteophyte fracture line, which is not unexpected in the setting of interval healing. No evidence of ligamentous injury. 2. Otherwise unchanged degenerative findings. Electronically Signed   By: Wiliam Ke M.D.   On: 05/17/2023 20:39   DG Ribs Unilateral W/Chest Right  Result Date: 05/17/2023 CLINICAL DATA:  Fall, landed on right side. EXAM: RIGHT RIBS AND CHEST - 3+ VIEW COMPARISON:  None Available. FINDINGS:  Right dialysis catheter in place with the tip at the cavoatrial junction. Heart and mediastinal contours are within normal limits. No focal opacities or effusions. No acute bony abnormality. No visible rib fracture. No pneumothorax. IMPRESSION: No active cardiopulmonary disease. Electronically Signed   By: Charlett Nose M.D.   On: 05/17/2023 17:46   DG Knee Complete 4 Views Left  Result Date: 05/17/2023 CLINICAL DATA:  Fall EXAM: LEFT KNEE - COMPLETE 4+ VIEW COMPARISON:  None Available. FINDINGS: Mild degenerative changes in the left knee most pronounced in the patellofemoral compartment. No acute bony abnormality. Specifically, no fracture, subluxation, or dislocation. No joint effusion. IMPRESSION: No acute bony abnormality. Electronically Signed   By: Charlett Nose M.D.   On: 05/17/2023 17:45   DG Knee Complete 4 Views Right  Result Date: 05/17/2023 CLINICAL DATA:  Fall, landed on right side. EXAM: RIGHT KNEE - COMPLETE 4+ VIEW COMPARISON:  None Available. FINDINGS: Diffuse moderate to advanced degenerative disc disease throughout the right knee with joint space narrowing and spurring. No joint effusion. Chondrocalcinosis noted. No acute bony abnormality. Specifically, no fracture, subluxation, or dislocation. Soft tissues are intact. IMPRESSION: Diffuse moderate to advanced tricompartment degenerative changes. No acute bony abnormality. Electronically Signed   By: Charlett Nose M.D.   On: 05/17/2023 17:44   DG Shoulder Right  Result Date: 05/17/2023 CLINICAL DATA:  Fall, landed on right arm. EXAM: RIGHT SHOULDER - 2+ VIEW COMPARISON:  None Available. FINDINGS: Mild degenerative changes in the right AC and glenohumeral joints with joint space narrowing and spurring. No acute bony abnormality. Specifically, no fracture, subluxation, or dislocation. Right dialysis catheter noted with the tip at the cavoatrial junction. No visible rib fracture. Visualized right lung clear. IMPRESSION: No acute bony  abnormality. Electronically Signed   By: Charlett Nose M.D.   On: 05/17/2023 17:44   CT CERVICAL SPINE WO CONTRAST  Result Date: 05/17/2023 CLINICAL DATA:  Head trauma, moderate-severe.  Fall. EXAM: CT CERVICAL SPINE WITHOUT CONTRAST TECHNIQUE: Multidetector CT imaging of the cervical spine was performed without intravenous contrast. Multiplanar CT image reconstructions were also generated. RADIATION DOSE REDUCTION: This exam was performed according to the departmental dose-optimization program which includes automated exposure control, adjustment of the mA and/or kV according to patient size and/or use of iterative reconstruction technique. COMPARISON:  05/01/2023 CT and MRI FINDINGS: Alignment: Normal Skull base and vertebrae: Again noted are the large flowing bridging anterior osteophytes throughout the cervical and visualized upper thoracic spine compatible with diffuse idiopathic skeletal hyperostosis. Again noted is the fracture through the anterior bridging osteophyte at C3-4. Fracture line is slightly more evident on today's study, likely related to bone resorption in the interim. No new fracture. Soft tissues and spinal  canal: No prevertebral fluid or swelling. No visible canal hematoma. Disc levels: Diffuse disc space narrowing with large anterior flowing osteophytes. Mild bilateral degenerative facet disease diffusely. No visible disc herniation. Upper chest: No acute findings Other: None IMPRESSION: Again noted are the large flowing anterior osteophytes compatible with diffuse idiopathic skeletal hyperostosis. Fracture again seen through the anterior bridging osteophyte at C3-4. Fracture line slightly more evident on today's study compatible with interval bone resorption. No new fracture or malalignment. Electronically Signed   By: Charlett Nose M.D.   On: 05/17/2023 17:43   DG Cervical Spine 2 or 3 views  Result Date: 05/17/2023 CLINICAL DATA:  Cervical fracture EXAM: CERVICAL SPINE - 2-3 VIEW  COMPARISON:  Cervical spine CT today and 05/01/2023 FINDINGS: The previously seen fracture through the anterior bridging osteophyte at C3-4 difficult to visualize by plain film. See further discussion on cervical spine CT today. Normal alignment. Large flowing anterior osteophytes compatible with diffuse idiopathic skeletal hyperostosis. No visible new fracture. IMPRESSION: Difficult to visualize the previously seen fracture through the anterior bridging osteophyte at C3-4. See further discussion on the cervical spine CT. No additional acute bony abnormality. Electronically Signed   By: Charlett Nose M.D.   On: 05/17/2023 17:40   CT HEAD WO CONTRAST ( )  Result Date: 05/17/2023 CLINICAL DATA:  Head trauma, moderate-severe.  Fall. EXAM: CT HEAD WITHOUT CONTRAST TECHNIQUE: Contiguous axial images were obtained from the base of the skull through the vertex without intravenous contrast. RADIATION DOSE REDUCTION: This exam was performed according to the departmental dose-optimization program which includes automated exposure control, adjustment of the mA and/or kV according to patient size and/or use of iterative reconstruction technique. COMPARISON:  05/01/2023 CT FINDINGS: Brain: There is atrophy and chronic small vessel disease changes. No acute intracranial abnormality. Specifically, no hemorrhage, hydrocephalus, mass lesion, acute infarction, or significant intracranial injury. Vascular: No hyperdense vessel or unexpected calcification. Skull: No acute calvarial abnormality. Sinuses/Orbits: No acute findings Other: None IMPRESSION: Atrophy, chronic microvascular disease. No acute intracranial abnormality. Electronically Signed   By: Charlett Nose M.D.   On: 05/17/2023 17:38   [place] OR NERVE BLOCK-IMAGE ONLY [facility])  Result Date: 05/17/2023 There is no interpretation for this exam.  This order is for images obtained during a surgical procedure.  Please See "Surgeries" Tab for more information regarding the  procedure.   MR Cervical Spine Wo Contrast  Result Date: 05/01/2023 CLINICAL DATA:  Fall 2 days ago.  Right arm pain. EXAM: MRI CERVICAL SPINE WITHOUT CONTRAST TECHNIQUE: Multiplanar, multisequence MR imaging of the cervical spine was performed. No intravenous contrast was administered. COMPARISON:  Same-day CT cervical spine, cervical spine MRI 12/28/2021. FINDINGS: Alignment: Normal. Vertebrae: Vertebral body heights are preserved. There is thin STIR signal abnormality traversing the large bridging C3-C4 anterior osteophyte consistent with known nondisplaced fracture seen on same-day CT. There is no epidural hematoma. There is no other evidence of acute fracture. Background marrow signal is normal. There is no suspicious marrow signal abnormality. There are bulky bridging anterior osteophytes throughout the cervical spine consistent with diffuse idiopathic skeletal hyperostosis. Cord: Normal in signal and morphology. Posterior Fossa, vertebral arteries, paraspinal tissues: There are small remote infarcts in the right cerebellar hemisphere and pons. The imaged posterior fossa is otherwise unremarkable. There is mild prevertebral edema at the level of the fracture (11-18). There is no evidence of ligamentous injury. Disc levels: C2-C3: There is a shallow posterior disc osteophyte complex without significant spinal canal or neural foraminal stenosis. C3-C4: There is  a posterior disc osteophyte complex with mild bilateral uncovertebral ridging and facet arthropathy with ligamentum flavum thickening resulting in mild spinal canal stenosis without significant neural foraminal stenosis, not significantly changed. C4-C5: Mild uncovertebral and facet arthropathy without significant spinal canal or neural foraminal stenosis C5-C6: There is a shallow posterior disc osteophyte complex with right worse than left uncovertebral ridging and mild facet arthropathy with ligamentum flavum thickening resulting in mild spinal canal  stenosis and mild-to-moderate bilateral neural foraminal stenosis, unchanged. C6-C7: There is a shallow posterior disc osteophyte complex with mild uncovertebral and facet arthropathy resulting in moderate right and no significant left neural foraminal stenosis and no significant spinal canal stenosis, unchanged C7-T1: Mild facet arthropathy without significant spinal canal or neural foraminal stenosis. IMPRESSION: 1. Bulky flowing anterior osteophytes consistent with diffuse idiopathic skeletal hyperostosis with an acute fracture through the bridging osteophyte at C3-C4 as seen on same-day CT. Mild prevertebral soft tissue edema but no epidural hematoma or evidence of ligamentous injury. 2. Multilevel degenerative changes throughout the cervical spine as above, unchanged since the prior MRI from 2023. Electronically Signed   By: Lesia Hausen M.D.   On: 05/01/2023 12:57   DG Shoulder Right  Result Date: 05/01/2023 CLINICAL DATA:  Fall.  Pain.  [place] of uterine dialysis. EXAM: RIGHT SHOULDER - 2+ VIEW COMPARISON:  None Available. FINDINGS: Mild inferior glenohumeral joint space narrowing in inferior glenoid and humeral head-neck junction degenerative osteophytosis. There are chronic cyst within the humeral head. Mild acromioclavicular joint space narrowing and peripheral osteophytosis. Right internal jugular dual-lumen central venous catheter tips overlie the central superior vena cava. A cervical collar is noted. IMPRESSION: Mild glenohumeral and acromioclavicular osteoarthritis. Electronically Signed   By: Neita Garnet M.D.   On: 05/01/2023 12:37   CT HEAD WO CONTRAST ( )  Result Date: 05/01/2023 CLINICAL DATA:  fall EXAM: CT HEAD WITHOUT CONTRAST CT CERVICAL SPINE WITHOUT CONTRAST TECHNIQUE: Multidetector CT imaging of the head and cervical spine was performed following the standard protocol without intravenous contrast. Multiplanar CT image reconstructions of the cervical spine were also generated.  RADIATION DOSE REDUCTION: This exam was performed according to the departmental dose-optimization program which includes automated exposure control, adjustment of the mA and/or kV according to patient size and/or use of iterative reconstruction technique. COMPARISON:  CT head 04/28/23 FINDINGS: CT HEAD FINDINGS Brain: No evidence of acute infarction, hemorrhage, hydrocephalus, extra-axial collection or mass lesion/mass effect. Chronic infarct in the left caudate head. Sequela of moderate chronic microvascular ischemic change. Vascular: No hyperdense vessel or unexpected calcification. Skull: Normal. Negative for fracture or focal lesion. Sinuses/Orbits: No middle ear or mastoid effusion. Paranasal sinuses are clear. Bilateral lens replacement. Orbits are otherwise unremarkable. Other: None. CT CERVICAL SPINE FINDINGS Alignment: Normal. Skull base and vertebrae: There is an acute nondisplaced fracture through the bridging osteophyte at the C3-C4 level (series 6, image 22). 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 Soft tissues and spinal canal: Mild prevertebral soft tissue swelling of the C3-C4 level. No visible canal hematoma. Disc levels: Mild-to-moderate multilevel stenosis in the upper and midcervical spine Upper chest: Negative. Other: Atherosclerotic calcifications of the carotid bifurcation bilaterally IMPRESSION: 1. No CT evidence of intracranial injury. 2. Acute nondisplaced fracture through the anterior bridging osteophyte at the C3-C4 level. Mild prevertebral soft tissue swelling. No visible canal hematoma. Recommend further evaluation with a cervical spine MRI Electronically Signed   By: Lorenza Cambridge M.D.   On: 05/01/2023 11:34   CT Cervical Spine Wo Contrast  Result  Date: 05/01/2023 CLINICAL DATA:  fall EXAM: CT HEAD WITHOUT CONTRAST CT CERVICAL SPINE WITHOUT CONTRAST TECHNIQUE: Multidetector CT imaging of the head and cervical spine was performed following the standard protocol without intravenous  contrast. Multiplanar CT image reconstructions of the cervical spine were also generated. RADIATION DOSE REDUCTION: This exam was performed according to the departmental dose-optimization program which includes automated exposure control, adjustment of the mA and/or kV according to patient size and/or use of iterative reconstruction technique. COMPARISON:  CT head 04/28/23 FINDINGS: CT HEAD FINDINGS Brain: No evidence of acute infarction, hemorrhage, hydrocephalus, extra-axial collection or mass lesion/mass effect. Chronic infarct in the left caudate head. Sequela of moderate chronic microvascular ischemic change. Vascular: No hyperdense vessel or unexpected calcification. Skull: Normal. Negative for fracture or focal lesion. Sinuses/Orbits: No middle ear or mastoid effusion. Paranasal sinuses are clear. Bilateral lens replacement. Orbits are otherwise unremarkable. Other: None. CT CERVICAL SPINE FINDINGS Alignment: Normal. Skull base and vertebrae: There is an acute nondisplaced fracture through the bridging osteophyte at the C3-C4 level (series 6, image 22). 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 Soft tissues and spinal canal: Mild prevertebral soft tissue swelling of the C3-C4 level. No visible canal hematoma. Disc levels: Mild-to-moderate multilevel stenosis in the upper and midcervical spine Upper chest: Negative. Other: Atherosclerotic calcifications of the carotid bifurcation bilaterally IMPRESSION: 1. No CT evidence of intracranial injury. 2. Acute nondisplaced fracture through the anterior bridging osteophyte at the C3-C4 level. Mild prevertebral soft tissue swelling. No visible canal hematoma. Recommend further evaluation with a cervical spine MRI Electronically Signed   By: Lorenza Cambridge M.D.   On: 05/01/2023 11:34   VAS [place] ABI WITH/WO TBI  Result Date: 04/30/2023  LOWER EXTREMITY DOPPLER STUDY Patient Name:  Janne Mcmellon  Date of Exam:   04/27/2023 Medical Rec #: [id number]         Accession #:     [id number] Date of Birth: 24-May-1951         Patient Gender: F Patient Age:   87 years Exam Location:  [facility] Procedure:      VAS [place] ABI WITH/WO TBI Referring Phys: Festus Barren --------------------------------------------------------------------------------  Indications: Peripheral artery disease.  Performing Technologist: Debbe Bales RVS  Examination Guidelines: A complete evaluation includes at minimum, Doppler waveform signals and systolic blood pressure reading at the level of bilateral brachial, anterior tibial, and posterior tibial arteries, when vessel segments are accessible. Bilateral testing is considered an integral part of a complete examination. Photoelectric Plethysmograph (PPG) waveforms and toe systolic pressure readings are included as required and additional duplex testing as needed. Limited examinations for reoccurring indications may be performed as noted.  ABI Findings: +---------+------------------+-----+----------+--------+ Right    Rt Pressure (mmHg)IndexWaveform  Comment  +---------+------------------+-----+----------+--------+ Brachial 116                                       +---------+------------------+-----+----------+--------+ ATA      117               0.99 monophasic         +---------+------------------+-----+----------+--------+ PTA      0                 0.00 absent             +---------+------------------+-----+----------+--------+ PERO     119  1.01 monophasic         +---------+------------------+-----+----------+--------+ Great Toe109               0.92 Abnormal           +---------+------------------+-----+----------+--------+ +---------+------------------+-----+----------+-------+ Left     Lt Pressure (mmHg)IndexWaveform  Comment +---------+------------------+-----+----------+-------+ Brachial 118                                      +---------+------------------+-----+----------+-------+ ATA       105               0.89 monophasic        +---------+------------------+-----+----------+-------+ PTA      0                 0.00 absent            +---------+------------------+-----+----------+-------+ PERO     152               1.31 biphasic          +---------+------------------+-----+----------+-------+ Great Toe111               0.94 Normal            +---------+------------------+-----+----------+-------+ +-------+-----------+-----------+------------+------------+ ABI/TBIToday's ABIToday's TBIPrevious ABIPrevious TBI +-------+-----------+-----------+------------+------------+ Right  1.01       .92                                 +-------+-----------+-----------+------------+------------+ Left   1.31       .94                                 +-------+-----------+-----------+------------+------------+  Summary: Right: Resting right ankle-brachial index is within normal range. The right toe-brachial index is normal. Left: Resting left ankle-brachial index is within normal range. The left toe-brachial index is normal. *See table(s) above for measurements and observations.   Electronically signed by Festus Barren MD on 04/30/2023 at 9:38:58 AM.    Final    VAS [place] UPPER EXTREMITY ARTERIAL DUPLEX  Result Date: 04/30/2023  UPPER EXTREMITY DUPLEX STUDY Patient Name:  MARIADELCARMEN CALIVA  Date of Exam:   04/27/2023 Medical Rec #: [id number]         Accession #:    [id number] Date of Birth: [birth year]         Patient Gender: F Patient Age:   89 years Exam Location:  [facility] Procedure:      VAS [place] UPPER EXTREMITY ARTERIAL DUPLEX Referring Phys: Sheppard Plumber --------------------------------------------------------------------------------  Indications: Esrd.  Performing Technologist: Debbe Bales RVS  Examination Guidelines: A complete evaluation includes B-mode imaging, spectral Doppler, color Doppler, and power Doppler as needed of all accessible portions of each  vessel. Bilateral testing is considered an integral part of a complete examination. Limited examinations for reoccurring indications may be performed as noted.  Right Pre-Dialysis Findings: +-----------------------+----------+--------------------+---------+--------+ Location               PSV (cm/s)Intralum. Diam. (cm)Waveform Comments +-----------------------+----------+--------------------+---------+--------+ Brachial Antecub. fossa114       0.39                triphasic         +-----------------------+----------+--------------------+---------+--------+ Radial Art at Wrist    85        0.22  triphasic         +-----------------------+----------+--------------------+---------+--------+ Ulnar Art at Wrist     96        0.21                triphasic         +-----------------------+----------+--------------------+---------+--------+  Left Pre-Dialysis Findings: +-----------------------+----------+--------------------+---------+--------+ Location               PSV (cm/s)Intralum. Diam. (cm)Waveform Comments +-----------------------+----------+--------------------+---------+--------+ Brachial Antecub. fossa102       0.39                triphasic         +-----------------------+----------+--------------------+---------+--------+ Radial Art at Wrist    70        0.21                triphasic         +-----------------------+----------+--------------------+---------+--------+ Ulnar Art at Wrist     78        0.19                triphasic         +-----------------------+----------+--------------------+---------+--------+  Summary:  Right: No obstruction visualized in the right upper extremity. Left: No obstruction visualized in the left upper extremity. *See table(s) above for measurements and observations. Electronically signed by Festus Barren MD on 04/30/2023 at 9:38:41 AM.    Final    VAS [place] UPPER EXT VEIN MAPPING (PRE-OP AVF)  Result Date: 04/30/2023 UPPER  EXTREMITY VEIN MAPPING Patient Name:  LAQUEDA BIEDRZYCKI  Date of Exam:   04/27/2023 Medical Rec #: [id number]         Accession #:    [id number] Date of Birth: 1951-05-10         Patient Gender: F Patient Age:   3 years Exam Location:  [facility] Procedure:      VAS [place] UPPER EXT VEIN MAPPING (PRE-OP AVF) Referring Phys: Sheppard Plumber --------------------------------------------------------------------------------  Indications: Pre-access. History: Esrd.  Performing Technologist: Debbe Bales RVS  Examination Guidelines: A complete evaluation includes B-mode imaging, spectral Doppler, color Doppler, and power Doppler as needed of all accessible portions of each vessel. Bilateral testing is considered an integral part of a complete examination. Limited examinations for reoccurring indications may be performed as noted. +-----------------+-------------+----------+--------+ Right Cephalic   Diameter (cm)Depth (cm)Findings +-----------------+-------------+----------+--------+ Prox upper arm       0.30                        +-----------------+-------------+----------+--------+ Mid upper arm        0.27                        +-----------------+-------------+----------+--------+ Dist upper arm       0.31                        +-----------------+-------------+----------+--------+ Antecubital fossa    0.26                        +-----------------+-------------+----------+--------+ Prox forearm         0.23                        +-----------------+-------------+----------+--------+ Mid forearm          0.19                        +-----------------+-------------+----------+--------+  Dist forearm         0.17                        +-----------------+-------------+----------+--------+ +-----------------+-------------+----------+--------+ Right Basilic    Diameter (cm)Depth (cm)Findings +-----------------+-------------+----------+--------+ Mid upper arm        0.38                         +-----------------+-------------+----------+--------+ Dist upper arm       0.40                        +-----------------+-------------+----------+--------+ Antecubital fossa    0.37                        +-----------------+-------------+----------+--------+ Prox forearm         0.49                        +-----------------+-------------+----------+--------+ +-----------------+-------------+----------+--------+ Left Cephalic    Diameter (cm)Depth (cm)Findings +-----------------+-------------+----------+--------+ Prox upper arm       0.33                        +-----------------+-------------+----------+--------+ Mid upper arm        0.31                        +-----------------+-------------+----------+--------+ Dist upper arm       0.22                        +-----------------+-------------+----------+--------+ Antecubital fossa    0.18                        +-----------------+-------------+----------+--------+ Prox forearm         0.17                        +-----------------+-------------+----------+--------+ Mid forearm          0.16                        +-----------------+-------------+----------+--------+ Dist forearm         0.15                        +-----------------+-------------+----------+--------+ +-----------------+-------------+----------+--------+ Left Basilic     Diameter (cm)Depth (cm)Findings +-----------------+-------------+----------+--------+ Mid upper arm        0.38                        +-----------------+-------------+----------+--------+ Dist upper arm       0.40                        +-----------------+-------------+----------+--------+ Antecubital fossa    0.36                        +-----------------+-------------+----------+--------+ Summary: Right: Small diameter cephalic vein below elbow. Normal collapsible        veins. Upper arm cephalic vein diameters. Good caliber         basilic vein. Left: Small diameter cephalic vein below elbow. Normal collapsible       veins. Upper arm cephalic vein diameters. Good caliber basilic  vein. *See table(s) above for measurements and observations.  Diagnosing physician: Festus Barren MD Electronically signed by Festus Barren MD on 04/30/2023 at 9:38:30 AM.    Final    MR BRAIN WO CONTRAST  Result Date: 04/28/2023 CLINICAL DATA:  Neuro deficit, acute, stroke suspected EXAM: MRI HEAD WITHOUT CONTRAST TECHNIQUE: Multiplanar, multiecho pulse sequences of the brain and surrounding structures were obtained without intravenous contrast. COMPARISON:  MR Head 12/26/21 FINDINGS: Brain: No acute infarction, hemorrhage, hydrocephalus, extra-axial collection or mass lesion. There is sequela of severe chronic microvascular ischemic change. Vascular: Normal flow voids. Skull and upper cervical spine: Normal marrow signal. Sinuses/Orbits: Small right mastoid effusion. Trace mucosal thickening in the right sphenoid sinus. Orbits are unremarkable. Other: None. IMPRESSION: No acute intracranial abnormality. Electronically Signed   By: Lorenza Cambridge M.D.   On: 04/28/2023 15:38   CT HEAD WO CONTRAST  Result Date: 04/28/2023 CLINICAL DATA:  Neuro deficit with acute stroke suspected EXAM: CT HEAD WITHOUT CONTRAST TECHNIQUE: Contiguous axial images were obtained from the base of the skull through the vertex without intravenous contrast. RADIATION DOSE REDUCTION: This exam was performed according to the departmental dose-optimization program which includes automated exposure control, adjustment of the mA and/or kV according to patient size and/or use of iterative reconstruction technique. COMPARISON:  01/21/2022 FINDINGS: Brain: No evidence of acute infarction, hemorrhage, hydrocephalus, extra-axial collection or mass lesion/mass effect. Low-density in the cerebral white matter attributed to chronic small vessel ischemia, moderately extensive. Chronic lacunar infarct at the  left caudate. Vascular: No hyperdense vessel or unexpected calcification. Skull: Normal. Negative for fracture or focal lesion. Sinuses/Orbits: No acute finding. IMPRESSION: 1. No acute finding. 2. Chronic small vessel ischemia without noted progression. Electronically Signed   By: Tiburcio Pea M.D.   On: 04/28/2023 10:54      Labs: BNP (last 3 results) No results for input(s): "BNP" in the last 8760 hours. Basic Metabolic Panel: Recent Labs  Lab 05/15/23 1340 05/17/23 0825 05/17/23 1850 05/18/23 0600 05/19/23 0414  NA 135 138 136 139 137  K 2.8* 3.6 4.1 4.4 3.6  CL 93* 99 95* 97* 100  CO2 28  --  26 27 29   GLUCOSE 89 89 77 86 89  BUN 13 30* 35* 34* 24*  CREATININE 3.21* 6.10* 6.23* 6.50* 4.61*  CALCIUM 8.9  --  9.2 9.0 8.5*  MG  --   --   --   --  2.0  PHOS  --   --   --   --  5.0*   Liver Function Tests: No results for input(s): "AST", "ALT", "ALKPHOS", "BILITOT", "PROT", "ALBUMIN" in the last 168 hours. No results for input(s): "LIPASE", "AMYLASE" in the last 168 hours. No results for input(s): "AMMONIA" in the last 168 hours. CBC: Recent Labs  Lab 05/15/23 1340 05/17/23 0825 05/17/23 1850 05/18/23 0600 05/19/23 0414  WBC 11.1*  --  10.9* 9.8 9.4  NEUTROABS 8.4*  --  8.3*  --   --   HGB 11.7* 14.3 11.6* 10.4* 9.7*  HCT 35.6* 42.0 35.6* 32.5* 30.1*  MCV 84.8  --  86.2 86.2 84.6  PLT 286  --  261 227 210   Cardiac Enzymes: No results for input(s): "CKTOTAL", "CKMB", "CKMBINDEX", "TROPONINI" in the last 168 hours. BNP: Invalid input(s): "POCBNP" CBG: Recent Labs  Lab 05/19/23 1156 05/19/23 1614 05/19/23 2031 05/20/23 0748 05/20/23 1242  GLUCAP 113* 169* 108* 124* 122*   D-Dimer No results for input(s): "DDIMER" in the last 72 hours. Hgb  A1c No results for input(s): "HGBA1C" in the last 72 hours. Lipid Profile No results for input(s): "CHOL", "HDL", "LDLCALC", "TRIG", "CHOLHDL", "LDLDIRECT" in the last 72 hours. Thyroid function studies No results  for input(s): "TSH", "T4TOTAL", "T3FREE", "THYROIDAB" in the last 72 hours.  Invalid input(s): "FREET3" Anemia work up Recent Labs    05/18/23 0600 05/18/23 1000  VITAMINB12  --  645  FOLATE 9.3  --    Urinalysis    Component Value Date/Time   COLORURINE YELLOW (A) 12/26/2021 1621   APPEARANCEUR HAZY (A) 12/26/2021 1621   LABSPEC 1.011 12/26/2021 1621   PHURINE 5.0 12/26/2021 1621   GLUCOSEU 150 (A) 12/26/2021 1621   HGBUR SMALL (A) 12/26/2021 1621   BILIRUBINUR NEGATIVE 12/26/2021 1621   KETONESUR NEGATIVE 12/26/2021 1621   PROTEINUR 100 (A) 12/26/2021 1621   NITRITE NEGATIVE 12/26/2021 1621   LEUKOCYTESUR TRACE (A) 12/26/2021 1621   Sepsis Labs Recent Labs  Lab 05/15/23 1340 05/17/23 1850 05/18/23 0600 05/19/23 0414  WBC 11.1* 10.9* 9.8 9.4   Microbiology No results found for this or any previous visit (from the past 240 hour(s)).   Total time spend on discharging this patient, including the last patient exam, discussing the hospital stay, instructions for ongoing care as it relates to all pertinent caregivers, as well as preparing the medical discharge records, prescriptions, and/or referrals as applicable, is 40 minutes.    Darlin Priestly, MD  [facility] 05/20/2023, 3:19 PM

## 2023-05-20 NOTE — Progress Notes (Signed)
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 Pt is received in bed, she is tired but agreeable to PT session. Pt performs bed mobility mod A for BLE management, and transfers min A using RW. Unable to amb using RW today due to increased level of fatigue, Pt adding "I'm more tired the day after dialysis". Pt able to perform 1 set of standing exercises and 1 set of supine exercises. Frequent cuing required for hand placement on RW for safety and upright standing posture. Pt would benefit from cont skilled PT to address above deficits and promote optimal return to PLOF.     If plan is discharge home, recommend the following: A little help with walking and/or transfers;A little help with bathing/dressing/bathroom;Help with stairs or ramp for entrance;Assist for transportation;Assistance with cooking/housework   Can travel by private vehicle     No  Equipment Recommendations  None recommended by PT    Recommendations for Other Services       Precautions / Restrictions Precautions Precautions: Fall Precaution Comments: right arm AVF placement 8/22, per Dr Wyn Quaker sling for comfort, can put weight on it without restrictions; Required Braces or Orthoses: Cervical Brace (per neurosurgery note 8/14 Will plan on keeping this for total of 2 to 3 months.) Cervical Brace: Other (comment) ([place]) Restrictions Weight Bearing Restrictions: No     Mobility  Bed Mobility Overal bed mobility: Needs Assistance Bed Mobility: Supine to Sit     Supine to sit: Mod assist     General bed mobility  comments: requires mod A for scooting EOB and cuing for hand placement    Transfers Overall transfer level: Needs assistance Equipment used: Rolling walker (2 wheels) Transfers: Sit to/from Stand Sit to Stand: Min assist           General transfer comment: min A for STS from bed and cuing for forward lean    Ambulation/Gait               General Gait Details: Not performed due to increased fatigue   Stairs             Wheelchair Mobility     Tilt Bed    Modified Rankin (Stroke Patients Only)       Balance Overall balance assessment: Needs assistance Sitting-balance support: Single extremity supported, Feet supported Sitting balance-[name]: Good Sitting balance - Comments: Able to maintain seated EOB balance during functional activities with reports of mild dizziness which improved following time Postural control: Other (comment) (slouch) Standing balance support: During functional activity, Bilateral upper extremity supported, Reliant on assistive device for balance Standing balance-[name]: Fair Standing balance comment: frequent verbal and tactile cuing for improved upright posture                            Cognition Arousal: Lethargic Behavior During Therapy: WFL for tasks assessed/performed Overall Cognitive Status: No family/caregiver present to determine baseline cognitive functioning Area of Impairment: Safety/judgement  Safety/Judgement: Decreased awareness of deficits     General Comments: Pleasant but tired, able to follow directions        Exercises Other Exercises Other Exercises: x10 standing marches, x10 SLR    General Comments        Pertinent Vitals/Pain Pain Assessment Pain Assessment: 0-10 Pain Score: 6  Pain Location: B shoulders Pain Descriptors / Indicators: Discomfort Pain Intervention(s): Monitored during session, Premedicated before session    Home  Living                          Prior Function            PT Goals (current goals can now be found in the care plan section) Acute Rehab PT Goals Patient Stated Goal: to get better PT Goal Formulation: With patient Time For Goal Achievement: 06/02/23 Potential to Achieve Goals: Good Progress towards PT goals: Progressing toward goals    Frequency    Min 1X/week      PT Plan      Co-evaluation              AM-PAC PT "6 Clicks" Mobility   Outcome Measure  Help needed turning from your back to your side while in a flat bed without using bedrails?: A Little Help needed moving from lying on your back to sitting on the side of a flat bed without using bedrails?: A Little Help needed moving to and from a bed to a chair (including a wheelchair)?: A Little Help needed standing up from a chair using your arms (e.g., wheelchair or bedside chair)?: A Little Help needed to walk in hospital room?: A Little Help needed climbing 3-5 steps with a railing? : A Lot 6 Click Score: 17    End of Session Equipment Utilized During Treatment: Gait belt;Cervical collar;Other (comment) (RUE sling) Activity Tolerance: Patient limited by lethargy;Patient limited by fatigue Patient left: in bed;with call bell/phone within reach;with bed alarm set Nurse Communication: Mobility status PT Visit Diagnosis: Unsteadiness on feet (R26.81);Other abnormalities of gait and mobility (R26.89);Repeated falls (R29.6);History of falling (Z91.81);Difficulty in walking, not elsewhere classified (R26.2);Muscle weakness (generalized) (M62.81);Pain Pain - Right/Left: Right Pain - part of body: Knee     Time: 6962-9528 PT Time Calculation (min) (ACUTE ONLY): 12 min  Charges:                            Elmon Else, SPT    Deanna [name] 05/20/2023, 3:10 PM

## 2023-05-20 NOTE — TOC Transition Note (Addendum)
Transition of Care [facility]) - CM/SW Discharge Note   Patient Details  Name: Deanna [name] MRN: [id number] Date of Birth: [birth year]  Transition of [facility]) CM/SW Contact:  Kemper Durie, RN Phone Number: 05/20/2023, 3:34 PM   Clinical Narrative:     Admitted from home, lives with caregiver.  PCP is at [facility].  Aware of recommendations for SNF for short term rehab, but declines and want to go [facility] with [facility] instead.  Orders received for HHPT/OT, [facility] accepted referral and aware of discharge home today.  Confirmed with caregiver, Tammie, that patient will need transportation home due to increase fall risk getting in the home (steps).  Tammie report patient has home modifications and DME (walk in shower, raised toilet seat, left chair, rollator, waiting to get stair lift installed).  Feels patient will be able to get into the bathroom, but will discuss additional DME with [facility] when they come in the home for assessment.  Medical Necessity form and facesheet completed and printed, EMS called, patient is next on the list.  Final next level of care: Home w Home Health Services Barriers to Discharge: Continued Medical Work up   Patient Goals and CMS Choice CMS Medicare.gov Compare Post Acute Care list provided to:: Patient Choice offered to / list presented to : Patient  Discharge Placement                  Patient to be transferred to facility by: going home by non-emergent [facility] Name of family member notified: Kandace Parkins Patient and family notified of of transfer: 05/20/23  Discharge Plan and Services Additional resources added to the After Visit Summary for                            [facility] Arranged: PT, OT HH Agency: [facility] Date [facility] Agency Contacted: 05/20/23 Time HH Agency Contacted: 1533 Representative spoke with at [facility] Agency: cory  Social Determinants of Health (SDOH) Interventions SDOH Screenings   Food Insecurity: No Food  Insecurity (05/18/2023)  Housing: Low Risk  (05/18/2023)  Transportation Needs: No Transportation Needs (05/18/2023)  Utilities: Not At Risk (05/18/2023)  Tobacco Use: Low Risk  (05/18/2023)     Readmission Risk Interventions    01/16/2022    3:45 PM 12/29/2021    2:33 PM  Readmission Risk Prevention Plan  Transportation Screening Complete Complete  PCP or Specialist Appt within 5-7 Days  Complete  Home Care Screening  Complete  Medication Review (RN CM)  Complete  Medication Review (RN Care Manager) Complete   PCP or Specialist appointment within 3-5 days of discharge Complete   HRI or Home Care Consult Complete   SW Recovery Care/Counseling Consult Complete   Palliative Care Screening Not Applicable   Skilled Nursing Facility Not Applicable

## 2023-05-23 ENCOUNTER — Ambulatory Visit
Admission: RE | Admit: 2023-05-23 | Discharge: 2023-05-23 | Disposition: A | Discharge status: Home / Self Care | Payer: Medicare HMO | Source: Ambulatory Visit | Attending: Neurosurgery | Admitting: Neurosurgery

## 2023-05-23 DIAGNOSIS — S12201D Unspecified nondisplaced fracture of third cervical vertebra, subsequent encounter for fracture with routine healing: Secondary | ICD-10-CM | POA: Diagnosis present

## 2023-06-14 ENCOUNTER — Telehealth: Payer: Self-pay | Admitting: Neurosurgery

## 2023-06-14 NOTE — Telephone Encounter (Signed)
Yes please, send a new hard collar. We'll flex ex her at next appt, and if she fails that will need to stay in collar

## 2023-06-14 NOTE — Telephone Encounter (Signed)
I spoke with Will at [facility]. She informed Will that her neck brace is broken. Per Will, the left strap is broken off. The patient is no longer at their office. I tried to call the patient to discuss, but she did not answer and her voicemail is not set up yet.

## 2023-06-14 NOTE — Telephone Encounter (Signed)
Patient is calling also asking about getting anew brace.

## 2023-06-14 NOTE — Telephone Encounter (Signed)
Dr Katrinka Blazing, would you like [place] to send an order to [facility] for a new neck brace? If so, what type of neck brace do you prefer? Her next appointment with you is on 06/27/23.

## 2023-06-14 NOTE — Telephone Encounter (Signed)
Patient presented today for her dialysis this morning. She told them that her neck brace was broken and she wanted them to put it back together with tape. Can you call Will to see what patient should do.

## 2023-06-15 NOTE — Telephone Encounter (Signed)
Patient returned call and I informed her that [facility] will reach out to get the collar to her.

## 2023-06-15 NOTE — Telephone Encounter (Signed)
Tried to reach patient, she did not answer and her voicemail has not been set up. I spoke to patient's caregiver and she states she will get patient to call our office if she has any further questions.

## 2023-06-18 ENCOUNTER — Other Ambulatory Visit (INDEPENDENT_AMBULATORY_CARE_PROVIDER_SITE_OTHER): Payer: Self-pay | Admitting: Vascular Surgery

## 2023-06-18 DIAGNOSIS — Z95828 Presence of other vascular implants and grafts: Secondary | ICD-10-CM

## 2023-06-18 DIAGNOSIS — N186 End stage renal disease: Secondary | ICD-10-CM

## 2023-06-20 ENCOUNTER — Ambulatory Visit (INDEPENDENT_AMBULATORY_CARE_PROVIDER_SITE_OTHER): Payer: Medicare HMO | Admitting: Nurse Practitioner

## 2023-06-20 ENCOUNTER — Ambulatory Visit (INDEPENDENT_AMBULATORY_CARE_PROVIDER_SITE_OTHER): Payer: Medicare HMO

## 2023-06-20 ENCOUNTER — Encounter (INDEPENDENT_AMBULATORY_CARE_PROVIDER_SITE_OTHER): Payer: Self-pay | Admitting: Nurse Practitioner

## 2023-06-20 VITALS — BP 103/90 | HR 83 | Resp 18 | Ht 73.0 in | Wt 156.0 lb

## 2023-06-20 DIAGNOSIS — E119 Type 2 diabetes mellitus without complications: Secondary | ICD-10-CM

## 2023-06-20 DIAGNOSIS — N186 End stage renal disease: Secondary | ICD-10-CM | POA: Diagnosis not present

## 2023-06-20 DIAGNOSIS — Z95828 Presence of other vascular implants and grafts: Secondary | ICD-10-CM

## 2023-06-20 DIAGNOSIS — M79645 Pain in left finger(s): Secondary | ICD-10-CM

## 2023-06-20 DIAGNOSIS — I739 Peripheral vascular disease, unspecified: Secondary | ICD-10-CM

## 2023-06-20 DIAGNOSIS — I10 Essential (primary) hypertension: Secondary | ICD-10-CM

## 2023-06-20 NOTE — H&P (View-Only) (Signed)
CL 100 05/19/2023 0414   CO2 29 05/19/2023 0414   GLUCOSE 89 05/19/2023 0414   BUN 24 (H) 05/19/2023 0414   CREATININE 4.61 (H) 05/19/2023 0414   CALCIUM 8.5 (L) 05/19/2023 0414   PROT 8.5 (H) 04/28/2023 1136   ALBUMIN 3.8 04/28/2023 1136   AST 16 04/28/2023 1136   ALT 15 04/28/2023 1136   ALKPHOS 113 04/28/2023 1136   BILITOT 0.5 04/28/2023 1136   GFRNONAA 10 (L) 05/19/2023 0414     VAS [place] ABI WITH/WO TBI  Result Date: 04/30/2023  LOWER EXTREMITY DOPPLER STUDY Patient Name:  Deanna [name]  Date of Exam:   04/27/2023 Medical Rec #: [id number]         Accession #:    [id number] Date of Birth: Mar 25, 1951         Patient Gender: F Patient Age:   72 years Exam Location:  [facility] Procedure:      VAS [place] ABI WITH/WO TBI Referring Phys: Festus Barren --------------------------------------------------------------------------------  Indications: Peripheral artery disease.  Performing Technologist: Debbe Bales RVS  Examination Guidelines: A complete evaluation includes at minimum, Doppler waveform signals and systolic blood pressure reading at the level of bilateral brachial, anterior tibial, and posterior tibial arteries, when vessel segments are accessible. Bilateral testing is considered an integral part of a complete examination. Photoelectric Plethysmograph (PPG) waveforms and toe systolic pressure readings are included as required and additional duplex testing as needed. Limited examinations for reoccurring indications may be performed as noted.  ABI Findings: +---------+------------------+-----+----------+--------+ Right    Rt Pressure (mmHg)IndexWaveform  Comment   +---------+------------------+-----+----------+--------+ Brachial 116                                       +---------+------------------+-----+----------+--------+ ATA      117               0.99 monophasic         +---------+------------------+-----+----------+--------+ PTA      0                 0.00 absent             +---------+------------------+-----+----------+--------+ PERO     119               1.01 monophasic         +---------+------------------+-----+----------+--------+ Great Toe109               0.92 Abnormal           +---------+------------------+-----+----------+--------+ +---------+------------------+-----+----------+-------+ Left     Lt Pressure (mmHg)IndexWaveform  Comment +---------+------------------+-----+----------+-------+ Brachial 118                                      +---------+------------------+-----+----------+-------+ ATA      105               0.89 monophasic        +---------+------------------+-----+----------+-------+ PTA      0                 0.00 absent            +---------+------------------+-----+----------+-------+ PERO     152               1.31 biphasic          +---------+------------------+-----+----------+-------+ [name]  Subjective:    Patient ID: Deanna [name], female    DOB: [birth year], 72 y.o.   MRN: [id number] Chief Complaint  Patient presents with   Follow-up    3 week follow up with HDA    The patient is a 72 year old female who returns today for follow-up evaluation following her left forearm loop graft placement on 05/17/2023.  She is currently still maintained via [facility].  She notes that she has not had any issues with wound healing but she has some periodic numbness and pain in her right thumb.  She notes that when it happens she has some difficulty with movement of them but it is not associated with being on dialysis.  She notes that this just happens at random moments and it did not occur until after she had her graft placed.  However the same day that she had her graft placed, the day she was returning home from the hospital she was going upstairs and proceeded to have a fairly significant fall where she was found to have broken neck.  Today noninvasive studies show a flow volume of 659.  There is an area of significant stenosis near the arterial anastomosis site.    Review of Systems  Musculoskeletal:  Positive for arthralgias and neck pain.  Neurological:  Positive for weakness.  All other systems reviewed and are negative.      Objective:   Physical Exam Vitals reviewed.  HENT:     Head: Normocephalic.  Cardiovascular:     Rate and Rhythm: Normal rate.     Pulses:          Radial pulses are 2+ on the right side.     Arteriovenous access: Right arteriovenous access is present.    Comments: Not easily palpable thrill with somewhat high-pitched bruit Pulmonary:     Effort: Pulmonary effort is normal.  Skin:    General: Skin is warm and dry.  Neurological:     Mental Status: She is alert and oriented to person, place, and time.  Psychiatric:        Mood and Affect: Mood normal.        Behavior: Behavior normal.        Thought Content: Thought content normal.         Judgment: Judgment normal.     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   Anemia    C3 cervical fracture (HCC) 04/2023   CHF (congestive heart failure) (HCC)    combined systolic and diastolic   Coronary artery disease    ESRD on dialysis (HCC)    T-Th-Sat   GERD (gastroesophageal reflux disease)    Hyperlipidemia    Hypertension    Pneumonia    Secondary hyperparathyroidism of renal origin (HCC)    Stroke (HCC)    with right sided weakness   Type 2 diabetes mellitus with diabetic neuropathy (HCC)     Social History   Socioeconomic History   Marital status: Single    Spouse name: Not on file   Number of children: 3   Years of education: Not on file   Highest education level: Not on file  Occupational History   Not on file  Tobacco Use  Subjective:    Patient ID: Deanna [name], female    DOB: [birth year], 72 y.o.   MRN: [id number] Chief Complaint  Patient presents with   Follow-up    3 week follow up with HDA    The patient is a 72 year old female who returns today for follow-up evaluation following her left forearm loop graft placement on 05/17/2023.  She is currently still maintained via [facility].  She notes that she has not had any issues with wound healing but she has some periodic numbness and pain in her right thumb.  She notes that when it happens she has some difficulty with movement of them but it is not associated with being on dialysis.  She notes that this just happens at random moments and it did not occur until after she had her graft placed.  However the same day that she had her graft placed, the day she was returning home from the hospital she was going upstairs and proceeded to have a fairly significant fall where she was found to have broken neck.  Today noninvasive studies show a flow volume of 659.  There is an area of significant stenosis near the arterial anastomosis site.    Review of Systems  Musculoskeletal:  Positive for arthralgias and neck pain.  Neurological:  Positive for weakness.  All other systems reviewed and are negative.      Objective:   Physical Exam Vitals reviewed.  HENT:     Head: Normocephalic.  Cardiovascular:     Rate and Rhythm: Normal rate.     Pulses:          Radial pulses are 2+ on the right side.     Arteriovenous access: Right arteriovenous access is present.    Comments: Not easily palpable thrill with somewhat high-pitched bruit Pulmonary:     Effort: Pulmonary effort is normal.  Skin:    General: Skin is warm and dry.  Neurological:     Mental Status: She is alert and oriented to person, place, and time.  Psychiatric:        Mood and Affect: Mood normal.        Behavior: Behavior normal.        Thought Content: Thought content normal.         Judgment: Judgment normal.     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   Anemia    C3 cervical fracture (HCC) 04/2023   CHF (congestive heart failure) (HCC)    combined systolic and diastolic   Coronary artery disease    ESRD on dialysis (HCC)    T-Th-Sat   GERD (gastroesophageal reflux disease)    Hyperlipidemia    Hypertension    Pneumonia    Secondary hyperparathyroidism of renal origin (HCC)    Stroke (HCC)    with right sided weakness   Type 2 diabetes mellitus with diabetic neuropathy (HCC)     Social History   Socioeconomic History   Marital status: Single    Spouse name: Not on file   Number of children: 3   Years of education: Not on file   Highest education level: Not on file  Occupational History   Not on file  Tobacco Use  CL 100 05/19/2023 0414   CO2 29 05/19/2023 0414   GLUCOSE 89 05/19/2023 0414   BUN 24 (H) 05/19/2023 0414   CREATININE 4.61 (H) 05/19/2023 0414   CALCIUM 8.5 (L) 05/19/2023 0414   PROT 8.5 (H) 04/28/2023 1136   ALBUMIN 3.8 04/28/2023 1136   AST 16 04/28/2023 1136   ALT 15 04/28/2023 1136   ALKPHOS 113 04/28/2023 1136   BILITOT 0.5 04/28/2023 1136   GFRNONAA 10 (L) 05/19/2023 0414     VAS [place] ABI WITH/WO TBI  Result Date: 04/30/2023  LOWER EXTREMITY DOPPLER STUDY Patient Name:  Deanna [name]  Date of Exam:   04/27/2023 Medical Rec #: [id number]         Accession #:    [id number] Date of Birth: Mar 25, 1951         Patient Gender: F Patient Age:   72 years Exam Location:  [facility] Procedure:      VAS [place] ABI WITH/WO TBI Referring Phys: Festus Barren --------------------------------------------------------------------------------  Indications: Peripheral artery disease.  Performing Technologist: Debbe Bales RVS  Examination Guidelines: A complete evaluation includes at minimum, Doppler waveform signals and systolic blood pressure reading at the level of bilateral brachial, anterior tibial, and posterior tibial arteries, when vessel segments are accessible. Bilateral testing is considered an integral part of a complete examination. Photoelectric Plethysmograph (PPG) waveforms and toe systolic pressure readings are included as required and additional duplex testing as needed. Limited examinations for reoccurring indications may be performed as noted.  ABI Findings: +---------+------------------+-----+----------+--------+ Right    Rt Pressure (mmHg)IndexWaveform  Comment   +---------+------------------+-----+----------+--------+ Brachial 116                                       +---------+------------------+-----+----------+--------+ ATA      117               0.99 monophasic         +---------+------------------+-----+----------+--------+ PTA      0                 0.00 absent             +---------+------------------+-----+----------+--------+ PERO     119               1.01 monophasic         +---------+------------------+-----+----------+--------+ Great Toe109               0.92 Abnormal           +---------+------------------+-----+----------+--------+ +---------+------------------+-----+----------+-------+ Left     Lt Pressure (mmHg)IndexWaveform  Comment +---------+------------------+-----+----------+-------+ Brachial 118                                      +---------+------------------+-----+----------+-------+ ATA      105               0.89 monophasic        +---------+------------------+-----+----------+-------+ PTA      0                 0.00 absent            +---------+------------------+-----+----------+-------+ PERO     152               1.31 biphasic          +---------+------------------+-----+----------+-------+ [name]  Subjective:    Patient ID: Deanna [name], female    DOB: [birth year], 72 y.o.   MRN: [id number] Chief Complaint  Patient presents with   Follow-up    3 week follow up with HDA    The patient is a 72 year old female who returns today for follow-up evaluation following her left forearm loop graft placement on 05/17/2023.  She is currently still maintained via [facility].  She notes that she has not had any issues with wound healing but she has some periodic numbness and pain in her right thumb.  She notes that when it happens she has some difficulty with movement of them but it is not associated with being on dialysis.  She notes that this just happens at random moments and it did not occur until after she had her graft placed.  However the same day that she had her graft placed, the day she was returning home from the hospital she was going upstairs and proceeded to have a fairly significant fall where she was found to have broken neck.  Today noninvasive studies show a flow volume of 659.  There is an area of significant stenosis near the arterial anastomosis site.    Review of Systems  Musculoskeletal:  Positive for arthralgias and neck pain.  Neurological:  Positive for weakness.  All other systems reviewed and are negative.      Objective:   Physical Exam Vitals reviewed.  HENT:     Head: Normocephalic.  Cardiovascular:     Rate and Rhythm: Normal rate.     Pulses:          Radial pulses are 2+ on the right side.     Arteriovenous access: Right arteriovenous access is present.    Comments: Not easily palpable thrill with somewhat high-pitched bruit Pulmonary:     Effort: Pulmonary effort is normal.  Skin:    General: Skin is warm and dry.  Neurological:     Mental Status: She is alert and oriented to person, place, and time.  Psychiatric:        Mood and Affect: Mood normal.        Behavior: Behavior normal.        Thought Content: Thought content normal.         Judgment: Judgment normal.     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   Anemia    C3 cervical fracture (HCC) 04/2023   CHF (congestive heart failure) (HCC)    combined systolic and diastolic   Coronary artery disease    ESRD on dialysis (HCC)    T-Th-Sat   GERD (gastroesophageal reflux disease)    Hyperlipidemia    Hypertension    Pneumonia    Secondary hyperparathyroidism of renal origin (HCC)    Stroke (HCC)    with right sided weakness   Type 2 diabetes mellitus with diabetic neuropathy (HCC)     Social History   Socioeconomic History   Marital status: Single    Spouse name: Not on file   Number of children: 3   Years of education: Not on file   Highest education level: Not on file  Occupational History   Not on file  Tobacco Use

## 2023-06-20 NOTE — Progress Notes (Signed)
CL 100 05/19/2023 0414   CO2 29 05/19/2023 0414   GLUCOSE 89 05/19/2023 0414   BUN 24 (H) 05/19/2023 0414   CREATININE 4.61 (H) 05/19/2023 0414   CALCIUM 8.5 (L) 05/19/2023 0414   PROT 8.5 (H) 04/28/2023 1136   ALBUMIN 3.8 04/28/2023 1136   AST 16 04/28/2023 1136   ALT 15 04/28/2023 1136   ALKPHOS 113 04/28/2023 1136   BILITOT 0.5 04/28/2023 1136   GFRNONAA 10 (L) 05/19/2023 0414     VAS [place] ABI WITH/WO TBI  Result Date: 04/30/2023  LOWER EXTREMITY DOPPLER STUDY Patient Name:  Deanna [name]  Date of Exam:   04/27/2023 Medical Rec #: [id number]         Accession #:    [id number] Date of Birth: Mar 25, 1951         Patient Gender: F Patient Age:   45 years Exam Location:  [facility] Procedure:      VAS [place] ABI WITH/WO TBI Referring Phys: Festus Barren --------------------------------------------------------------------------------  Indications: Peripheral artery disease.  Performing Technologist: Debbe Bales RVS  Examination Guidelines: A complete evaluation includes at minimum, Doppler waveform signals and systolic blood pressure reading at the level of bilateral brachial, anterior tibial, and posterior tibial arteries, when vessel segments are accessible. Bilateral testing is considered an integral part of a complete examination. Photoelectric Plethysmograph (PPG) waveforms and toe systolic pressure readings are included as required and additional duplex testing as needed. Limited examinations for reoccurring indications may be performed as noted.  ABI Findings: +---------+------------------+-----+----------+--------+ Right    Rt Pressure (mmHg)IndexWaveform  Comment   +---------+------------------+-----+----------+--------+ Brachial 116                                       +---------+------------------+-----+----------+--------+ ATA      117               0.99 monophasic         +---------+------------------+-----+----------+--------+ PTA      0                 0.00 absent             +---------+------------------+-----+----------+--------+ PERO     119               1.01 monophasic         +---------+------------------+-----+----------+--------+ Great Toe109               0.92 Abnormal           +---------+------------------+-----+----------+--------+ +---------+------------------+-----+----------+-------+ Left     Lt Pressure (mmHg)IndexWaveform  Comment +---------+------------------+-----+----------+-------+ Brachial 118                                      +---------+------------------+-----+----------+-------+ ATA      105               0.89 monophasic        +---------+------------------+-----+----------+-------+ PTA      0                 0.00 absent            +---------+------------------+-----+----------+-------+ PERO     152               1.31 biphasic          +---------+------------------+-----+----------+-------+ Deanna [name]  Subjective:    Patient ID: Deanna [name], female    DOB: [birth year], 72 y.o.   MRN: [id number] Chief Complaint  Patient presents with   Follow-up    3 week follow up with HDA    The patient is a 72 year old female who returns today for follow-up evaluation following her left forearm loop graft placement on 05/17/2023.  She is currently still maintained via PermCath.  She notes that she has not had any issues with wound healing but she has some periodic numbness and pain in her right thumb.  She notes that when it happens she has some difficulty with movement of them but it is not associated with being on dialysis.  She notes that this just happens at random moments and it did not occur until after she had her graft placed.  However the same day that she had her graft placed, the day she was returning home from the hospital she was going upstairs and proceeded to have a fairly significant fall where she was found to have broken neck.  Today noninvasive studies show a flow volume of 659.  There is an area of significant stenosis near the arterial anastomosis site.    Review of Systems  Musculoskeletal:  Positive for arthralgias and neck pain.  Neurological:  Positive for weakness.  All other systems reviewed and are negative.      Objective:   Physical Exam Vitals reviewed.  HENT:     Head: Normocephalic.  Cardiovascular:     Rate and Rhythm: Normal rate.     Pulses:          Radial pulses are 2+ on the right side.     Arteriovenous access: Right arteriovenous access is present.    Comments: Not easily palpable thrill with somewhat high-pitched bruit Pulmonary:     Effort: Pulmonary effort is normal.  Skin:    General: Skin is warm and dry.  Neurological:     Mental Status: She is alert and oriented to person, place, and time.  Psychiatric:        Mood and Affect: Mood normal.        Behavior: Behavior normal.        Thought Content: Thought content normal.         Judgment: Judgment normal.     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   Anemia    C3 cervical fracture (HCC) 04/2023   CHF (congestive heart failure) (HCC)    combined systolic and diastolic   Coronary artery disease    ESRD on dialysis (HCC)    T-Th-Sat   GERD (gastroesophageal reflux disease)    Hyperlipidemia    Hypertension    Pneumonia    Secondary hyperparathyroidism of renal origin (HCC)    Stroke (HCC)    with right sided weakness   Type 2 diabetes mellitus with diabetic neuropathy (HCC)     Social History   Socioeconomic History   Marital status: Single    Spouse name: Not on file   Number of children: 3   Years of education: Not on file   Highest education level: Not on file  Occupational History   Not on file  Tobacco Use  Subjective:    Patient ID: Deanna [name], female    DOB: [birth year], 72 y.o.   MRN: [id number] Chief Complaint  Patient presents with   Follow-up    3 week follow up with HDA    The patient is a 72 year old female who returns today for follow-up evaluation following her left forearm loop graft placement on 05/17/2023.  She is currently still maintained via PermCath.  She notes that she has not had any issues with wound healing but she has some periodic numbness and pain in her right thumb.  She notes that when it happens she has some difficulty with movement of them but it is not associated with being on dialysis.  She notes that this just happens at random moments and it did not occur until after she had her graft placed.  However the same day that she had her graft placed, the day she was returning home from the hospital she was going upstairs and proceeded to have a fairly significant fall where she was found to have broken neck.  Today noninvasive studies show a flow volume of 659.  There is an area of significant stenosis near the arterial anastomosis site.    Review of Systems  Musculoskeletal:  Positive for arthralgias and neck pain.  Neurological:  Positive for weakness.  All other systems reviewed and are negative.      Objective:   Physical Exam Vitals reviewed.  HENT:     Head: Normocephalic.  Cardiovascular:     Rate and Rhythm: Normal rate.     Pulses:          Radial pulses are 2+ on the right side.     Arteriovenous access: Right arteriovenous access is present.    Comments: Not easily palpable thrill with somewhat high-pitched bruit Pulmonary:     Effort: Pulmonary effort is normal.  Skin:    General: Skin is warm and dry.  Neurological:     Mental Status: She is alert and oriented to person, place, and time.  Psychiatric:        Mood and Affect: Mood normal.        Behavior: Behavior normal.        Thought Content: Thought content normal.         Judgment: Judgment normal.     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   Anemia    C3 cervical fracture (HCC) 04/2023   CHF (congestive heart failure) (HCC)    combined systolic and diastolic   Coronary artery disease    ESRD on dialysis (HCC)    T-Th-Sat   GERD (gastroesophageal reflux disease)    Hyperlipidemia    Hypertension    Pneumonia    Secondary hyperparathyroidism of renal origin (HCC)    Stroke (HCC)    with right sided weakness   Type 2 diabetes mellitus with diabetic neuropathy (HCC)     Social History   Socioeconomic History   Marital status: Single    Spouse name: Not on file   Number of children: 3   Years of education: Not on file   Highest education level: Not on file  Occupational History   Not on file  Tobacco Use  CL 100 05/19/2023 0414   CO2 29 05/19/2023 0414   GLUCOSE 89 05/19/2023 0414   BUN 24 (H) 05/19/2023 0414   CREATININE 4.61 (H) 05/19/2023 0414   CALCIUM 8.5 (L) 05/19/2023 0414   PROT 8.5 (H) 04/28/2023 1136   ALBUMIN 3.8 04/28/2023 1136   AST 16 04/28/2023 1136   ALT 15 04/28/2023 1136   ALKPHOS 113 04/28/2023 1136   BILITOT 0.5 04/28/2023 1136   GFRNONAA 10 (L) 05/19/2023 0414     VAS [place] ABI WITH/WO TBI  Result Date: 04/30/2023  LOWER EXTREMITY DOPPLER STUDY Patient Name:  Deanna [name]  Date of Exam:   04/27/2023 Medical Rec #: [id number]         Accession #:    [id number] Date of Birth: Mar 25, 1951         Patient Gender: F Patient Age:   45 years Exam Location:  [facility] Procedure:      VAS [place] ABI WITH/WO TBI Referring Phys: Festus Barren --------------------------------------------------------------------------------  Indications: Peripheral artery disease.  Performing Technologist: Debbe Bales RVS  Examination Guidelines: A complete evaluation includes at minimum, Doppler waveform signals and systolic blood pressure reading at the level of bilateral brachial, anterior tibial, and posterior tibial arteries, when vessel segments are accessible. Bilateral testing is considered an integral part of a complete examination. Photoelectric Plethysmograph (PPG) waveforms and toe systolic pressure readings are included as required and additional duplex testing as needed. Limited examinations for reoccurring indications may be performed as noted.  ABI Findings: +---------+------------------+-----+----------+--------+ Right    Rt Pressure (mmHg)IndexWaveform  Comment   +---------+------------------+-----+----------+--------+ Brachial 116                                       +---------+------------------+-----+----------+--------+ ATA      117               0.99 monophasic         +---------+------------------+-----+----------+--------+ PTA      0                 0.00 absent             +---------+------------------+-----+----------+--------+ PERO     119               1.01 monophasic         +---------+------------------+-----+----------+--------+ Great Toe109               0.92 Abnormal           +---------+------------------+-----+----------+--------+ +---------+------------------+-----+----------+-------+ Left     Lt Pressure (mmHg)IndexWaveform  Comment +---------+------------------+-----+----------+-------+ Brachial 118                                      +---------+------------------+-----+----------+-------+ ATA      105               0.89 monophasic        +---------+------------------+-----+----------+-------+ PTA      0                 0.00 absent            +---------+------------------+-----+----------+-------+ PERO     152               1.31 biphasic          +---------+------------------+-----+----------+-------+ Deanna [name]  Subjective:    Patient ID: Deanna [name], female    DOB: [birth year], 72 y.o.   MRN: [id number] Chief Complaint  Patient presents with   Follow-up    3 week follow up with HDA    The patient is a 72 year old female who returns today for follow-up evaluation following her left forearm loop graft placement on 05/17/2023.  She is currently still maintained via Perm[facility].  She notes that she has not had any issues with wound healing but she has some periodic numbness and pain in her right thumb.  She notes that when it happens she has some difficulty with movement of them but it is not associated with being on dialysis.  She notes that this just happens at random moments and it did not occur until after she had her graft placed.  However the same day that she had her graft placed, the day she was returning home from the hospital she was going upstairs and proceeded to have a fairly significant fall where she was found to have broken neck.  Today noninvasive studies show a flow volume of 659.  There is an area of significant stenosis near the arterial anastomosis site.    Review of Systems  Musculoskeletal:  Positive for arthralgias and neck pain.  Neurological:  Positive for weakness.  All other systems reviewed and are negative.      Objective:   Physical Exam Vitals reviewed.  HENT:     Head: Normocephalic.  Cardiovascular:     Rate and Rhythm: Normal rate.     Pulses:          Radial pulses are 2+ on the right side.     Arteriovenous access: Right arteriovenous access is present.    Comments: Not easily palpable thrill with somewhat high-pitched bruit Pulmonary:     Effort: Pulmonary effort is normal.  Skin:    General: Skin is warm and dry.  Neurological:     Mental Status: She is alert and oriented to person, place, and time.  Psychiatric:        Mood and Affect: Mood normal.        Behavior: Behavior normal.        Thought Content: Thought content normal.         Judgment: Judgment normal.     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   Anemia    C3 cervical fracture (HCC) 04/2023   CHF (congestive heart failure) (HCC)    combined systolic and diastolic   Coronary artery disease    ESRD on dialysis (HCC)    T-Th-Sat   GERD (gastroesophageal reflux disease)    Hyperlipidemia    Hypertension    Pneumonia    Secondary hyperparathyroidism of renal origin (HCC)    Stroke (HCC)    with right sided weakness   Type 2 diabetes mellitus with diabetic neuropathy (HCC)     Social History   Socioeconomic History   Marital status: Single    Spouse name: Not on file   Number of children: 3   Years of education: Not on file   Highest education level: Not on file  Occupational History   Not on file  Tobacco Use

## 2023-06-22 ENCOUNTER — Telehealth (INDEPENDENT_AMBULATORY_CARE_PROVIDER_SITE_OTHER): Payer: Self-pay

## 2023-06-22 NOTE — Telephone Encounter (Signed)
Spoke with the patient and she is scheduled with Dr. Wyn Quaker on 06/28/23 with a 11:00 am arrival time to the [facility] for a right arm fistulagram. Pre-procedure instructions were discussed and will be mailed.

## 2023-06-25 ENCOUNTER — Other Ambulatory Visit: Payer: Self-pay

## 2023-06-25 DIAGNOSIS — S12201D Unspecified nondisplaced fracture of third cervical vertebra, subsequent encounter for fracture with routine healing: Secondary | ICD-10-CM

## 2023-06-25 DIAGNOSIS — S12201A Unspecified nondisplaced fracture of third cervical vertebra, initial encounter for closed fracture: Secondary | ICD-10-CM

## 2023-06-27 ENCOUNTER — Ambulatory Visit (INDEPENDENT_AMBULATORY_CARE_PROVIDER_SITE_OTHER): Payer: Medicare HMO | Admitting: Neurosurgery

## 2023-06-27 ENCOUNTER — Encounter: Payer: Self-pay | Admitting: Neurosurgery

## 2023-06-27 VITALS — BP 130/62 | Ht 73.0 in | Wt 156.0 lb

## 2023-06-27 DIAGNOSIS — S12291D Other nondisplaced fracture of third cervical vertebra, subsequent encounter for fracture with routine healing: Secondary | ICD-10-CM

## 2023-06-27 DIAGNOSIS — S12201A Unspecified nondisplaced fracture of third cervical vertebra, initial encounter for closed fracture: Secondary | ICD-10-CM

## 2023-06-27 DIAGNOSIS — W19XXXD Unspecified fall, subsequent encounter: Secondary | ICD-10-CM | POA: Diagnosis not present

## 2023-06-27 NOTE — Progress Notes (Signed)
Follow-up with [name] in clinic today.  She continues to wear a collar.  Unfortunately was not able to get x-rays prior to this appointment.  We did a passive range of motion active range of motion test on her neck which did not show any exacerbation of her symptoms.  We like her to get flexion-extension x-rays prior to clearance of her c-collar.  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  She showed some evidence of improvement.  On physical examination she shows stable strength in the bilateral upper and lower extremities both proximally and distally.  No evidence of acute changes in her neurologic examination.  No worsening of her neck posture since seen last, continues to had a forward head carriage.  Does not appear to be locked in any abnormal positioning.  We like to follow-up with her after her x-rays.  No further intervention is planned at this time.

## 2023-06-28 ENCOUNTER — Telehealth (INDEPENDENT_AMBULATORY_CARE_PROVIDER_SITE_OTHER): Payer: Self-pay

## 2023-06-28 ENCOUNTER — Ambulatory Visit
Admission: RE | Admit: 2023-06-28 | Discharge: 2023-06-28 | Disposition: A | Discharge status: Home / Self Care | Payer: Medicare HMO | Attending: Vascular Surgery | Admitting: Vascular Surgery

## 2023-06-28 ENCOUNTER — Encounter
Admission: RE | Disposition: A | Discharge status: Home / Self Care | Payer: Self-pay | Source: Home / Self Care | Attending: Vascular Surgery

## 2023-06-28 ENCOUNTER — Encounter: Payer: Self-pay | Admitting: Vascular Surgery

## 2023-06-28 DIAGNOSIS — Z992 Dependence on renal dialysis: Secondary | ICD-10-CM | POA: Insufficient documentation

## 2023-06-28 DIAGNOSIS — I132 Hypertensive heart and chronic kidney disease with heart failure and with stage 5 chronic kidney disease, or end stage renal disease: Secondary | ICD-10-CM | POA: Diagnosis not present

## 2023-06-28 DIAGNOSIS — E1151 Type 2 diabetes mellitus with diabetic peripheral angiopathy without gangrene: Secondary | ICD-10-CM | POA: Insufficient documentation

## 2023-06-28 DIAGNOSIS — Y841 Kidney dialysis as the cause of abnormal reaction of the patient, or of later complication, without mention of misadventure at the time of the procedure: Secondary | ICD-10-CM | POA: Diagnosis not present

## 2023-06-28 DIAGNOSIS — N186 End stage renal disease: Secondary | ICD-10-CM | POA: Insufficient documentation

## 2023-06-28 DIAGNOSIS — Z7984 Long term (current) use of oral hypoglycemic drugs: Secondary | ICD-10-CM | POA: Insufficient documentation

## 2023-06-28 DIAGNOSIS — I5040 Unspecified combined systolic (congestive) and diastolic (congestive) heart failure: Secondary | ICD-10-CM | POA: Diagnosis not present

## 2023-06-28 DIAGNOSIS — M79645 Pain in left finger(s): Secondary | ICD-10-CM | POA: Diagnosis not present

## 2023-06-28 DIAGNOSIS — T82858A Stenosis of vascular prosthetic devices, implants and grafts, initial encounter: Secondary | ICD-10-CM | POA: Diagnosis present

## 2023-06-28 DIAGNOSIS — E1122 Type 2 diabetes mellitus with diabetic chronic kidney disease: Secondary | ICD-10-CM | POA: Insufficient documentation

## 2023-06-28 HISTORY — PX: A/V FISTULAGRAM: CATH118298

## 2023-06-28 LAB — GLUCOSE, CAPILLARY
Glucose-Capillary: 78 mg/dL (ref 70–99)
Glucose-Capillary: 120 mg/dL — ABNORMAL HIGH (ref 70–99)

## 2023-06-28 SURGERY — A/V FISTULAGRAM
Anesthesia: Moderate Sedation | Laterality: Right

## 2023-06-28 MED ORDER — METHYLPREDNISOLONE SODIUM SUCC 125 MG IJ SOLR: 125.0000 mg | Freq: Once | INTRAMUSCULAR | Status: DC | PRN

## 2023-06-28 MED ORDER — SODIUM CHLORIDE 0.9 % IV SOLN: INTRAVENOUS | Status: DC

## 2023-06-28 MED ORDER — HEPARIN SODIUM (PORCINE) 1000 UNIT/ML IJ SOLN
INTRAMUSCULAR | Status: AC
  Filled 2023-06-28: qty 10

## 2023-06-28 MED ORDER — IODIXANOL 320 MG/ML IV SOLN
INTRAVENOUS | Status: DC | PRN
  Administered 2023-06-28: 30 mL via INTRAVENOUS

## 2023-06-28 MED ORDER — MIDAZOLAM HCL 2 MG/2ML IJ SOLN
INTRAMUSCULAR | Status: DC | PRN
  Administered 2023-06-28: 1 mg via INTRAVENOUS
  Administered 2023-06-28: .5 mg via INTRAVENOUS

## 2023-06-28 MED ORDER — FENTANYL CITRATE (PF) 100 MCG/2ML IJ SOLN
INTRAMUSCULAR | Status: DC | PRN
  Administered 2023-06-28: 50 ug via INTRAVENOUS
  Administered 2023-06-28: 25 ug via INTRAVENOUS

## 2023-06-28 MED ORDER — HYDROMORPHONE HCL 1 MG/ML IJ SOLN: 1.0000 mg | Freq: Once | INTRAMUSCULAR | Status: DC | PRN

## 2023-06-28 MED ORDER — ONDANSETRON HCL 4 MG/2ML IJ SOLN: 4.0000 mg | Freq: Four times a day (QID) | INTRAMUSCULAR | Status: DC | PRN

## 2023-06-28 MED ORDER — FAMOTIDINE 20 MG PO TABS: 40.0000 mg | ORAL_TABLET | Freq: Once | ORAL | Status: DC | PRN

## 2023-06-28 MED ORDER — LIDOCAINE-EPINEPHRINE (PF) 1 %-1:200000 IJ SOLN
INTRAMUSCULAR | Status: DC | PRN
  Administered 2023-06-28: 5 mL via INTRADERMAL

## 2023-06-28 MED ORDER — CEFAZOLIN SODIUM-DEXTROSE 1-4 GM/50ML-% IV SOLN
1.0000 g | INTRAVENOUS | Status: AC
  Administered 2023-06-28: 1 g via INTRAVENOUS

## 2023-06-28 MED ORDER — CEFAZOLIN SODIUM-DEXTROSE 1-4 GM/50ML-% IV SOLN
INTRAVENOUS | Status: AC
  Filled 2023-06-28: qty 50

## 2023-06-28 MED ORDER — DEXTROSE 50 % IV SOLN
12.5000 g | Freq: Once | INTRAVENOUS | Status: AC
  Administered 2023-06-28: 12.5 g via INTRAVENOUS

## 2023-06-28 MED ORDER — FENTANYL CITRATE (PF) 100 MCG/2ML IJ SOLN
INTRAMUSCULAR | Status: AC
  Filled 2023-06-28: qty 2

## 2023-06-28 MED ORDER — HEPARIN (PORCINE) IN NACL 1000-0.9 UT/500ML-% IV SOLN
INTRAVENOUS | Status: DC | PRN
  Administered 2023-06-28: 1000 mL

## 2023-06-28 MED ORDER — HEPARIN SODIUM (PORCINE) 1000 UNIT/ML IJ SOLN
INTRAMUSCULAR | Status: DC | PRN
  Administered 2023-06-28: 3000 [IU] via INTRAVENOUS

## 2023-06-28 MED ORDER — MIDAZOLAM HCL 5 MG/5ML IJ SOLN
INTRAMUSCULAR | Status: AC
  Filled 2023-06-28: qty 5

## 2023-06-28 MED ORDER — DEXTROSE 50 % IV SOLN
INTRAVENOUS | Status: AC
  Filled 2023-06-28: qty 50

## 2023-06-28 MED ORDER — DIPHENHYDRAMINE HCL 50 MG/ML IJ SOLN: 50.0000 mg | Freq: Once | INTRAMUSCULAR | Status: DC | PRN

## 2023-06-28 MED ORDER — MIDAZOLAM HCL 2 MG/ML PO SYRP: 8.0000 mg | ORAL_SOLUTION | Freq: Once | ORAL | Status: DC | PRN

## 2023-06-28 SURGICAL SUPPLY — 13 items
BALLN LUTONIX DCB 5X40X130 (BALLOONS) ×2
BALLOON LUTONIX DCB 5X40X130 (BALLOONS) IMPLANT
CANNULA 5F STIFF (CANNULA) IMPLANT
CATH BEACON 5 .035 40 KMP TP (CATHETERS) IMPLANT
COVER PROBE ULTRASOUND 5X96 (MISCELLANEOUS) IMPLANT
DRAPE BRACHIAL (DRAPES) IMPLANT
GLIDEWIRE ADV .035X180CM (WIRE) IMPLANT
KIT ENCORE 26 ADVANTAGE (KITS) IMPLANT
PACK ANGIOGRAPHY (CUSTOM PROCEDURE TRAY) ×1 IMPLANT
SHEATH BRITE TIP 6FRX5.5 (SHEATH) IMPLANT
STENT VIABAHN 6X50X120 (Permanent Stent) IMPLANT
SUT MNCRL AB 4-0 PS2 18 (SUTURE) IMPLANT
WIRE G 018X200 V18 (WIRE) IMPLANT

## 2023-06-28 NOTE — Op Note (Signed)
[facility]    OPERATIVE NOTE   PROCEDURE: 1.  Right brachial artery to brachial vein forearm arteriovenous graft cannulation under ultrasound guidance 2.  Right arm shuntogram 3.  Percutaneous transluminal angioplasty of the arterial anastomosis with 5 mm diameter Lutonix drug-coated angioplasty balloon 4.  Percutaneous transluminal angioplasty of the venous anastomosis with 5 mm diameter Lutonix drug-coated angioplasty balloon 5.  Stent placement to the venous anastomosis with 6 mm diameter by 5 cm length Viabahn stent  PRE-OPERATIVE DIAGNOSIS: 1. ESRD 2. Malfunctioning right brachial artery to brachial vein loop forearm arteriovenous graft  POST-OPERATIVE DIAGNOSIS: same as above   SURGEON: Festus Barren, MD  ANESTHESIA: local with MCS  ESTIMATED BLOOD LOSS: 5 cc  FINDING(S): Significant stenosis of both the arterial anastomosis and venous anastomosis of the AV graft.  Both stenoses were 70% or greater.  The remainder of the graft was patent.  The venous outflow beyond the venous anastomosis was widely patent.  SPECIMEN(S):  None  CONTRAST: 30 cc  FLUORO TIME: 4.1 minutes  MODERATE CONSCIOUS SEDATION TIME:  Approximately 35 minutes using 1.5 mg of Versed and 75 mcg of Fentanyl  INDICATIONS: Deanna [name] is a 72 y.o. female who presents with malfunctioning right brachial artery to brachial vein loop forearm arteriovenous graft.  The patient is scheduled for right arm shuntogram.  The patient is aware the risks include but are not limited to: bleeding, infection, thrombosis of the cannulated access, and possible anaphylactic reaction to the contrast.  The patient is aware of the risks of the procedure and elects to proceed forward.  DESCRIPTION: After full informed written consent was obtained, the patient was brought back to the angiography suite and placed supine upon the angiography table.  The patient was connected to monitoring equipment. Moderate  conscious sedation was administered during a face to face encounter throughout the procedure with my supervision of the RN administering medicines and monitoring the patient's vital signs, pulse oximetry, telemetry and mental status throughout from the start of the procedure until the patient was taken to the recovery room The right arm was prepped and draped in the standard fashion for a percutaneous access intervention.  Under ultrasound guidance, the loop forearm arteriovenous graft was cannulated with a micropuncture needle under direct ultrasound in a retrograde fashion guidance were it was patent and a permanent image was performed.  The microwire was advanced into the graft and the needle was exchanged for the a microsheath.  I then upsized to a 6 Fr Sheath and imaging was performed.  Hand injections were completed to image the access including the central venous system. This demonstrated significant stenosis of both the arterial anastomosis and venous anastomosis of the AV graft.  Both stenoses were 70% or greater.  The remainder of the graft was patent.  The venous outflow beyond the venous anastomosis was widely patent..  Based on the images, this patient will need intervention to both anastomotic stenoses. I then gave the patient 3000 units of intravenous heparin.  I then crossed the stenosis at the arterial anastomosis with an Advantage wire.  Based on the imaging, a 5 mm x 4 cm Lutonix drug-coated angioplasty balloon was selected.  The balloon was centered around the stenosis and inflated to 12 ATM for 1 minute(s).  On completion imaging, a 20% residual stenosis was present.   Was then able to flip the graft into the antegrade direction with the help of the advantage wire.  This allowed me to cross  the venous anastomosis without difficulty with the advantage wire.  Angioplasty was performed with a 5 mm diameter by 4 cm length Lutonix drug-coated angioplasty balloon inflated to 10 atm for 1 minute.   Completion imaging showed a greater than 50% residual stenosis we exchanged for a 0.018 wire and then placed a 6 mm diameter by 5 cm length Viabahn stent postdilated this with a 5 mm balloon with excellent angiographic completion result and less than 10% residual stenosis.   Based on the completion imaging, no further intervention is necessary.  The wire and balloon were removed from the sheath.  A 4-0 Monocryl purse-string suture was sewn around the sheath.  The sheath was removed while tying down the suture.  A sterile bandage was applied to the puncture site.  COMPLICATIONS: None  CONDITION: Stable   Festus Barren  06/28/2023 1:45 PM    This note was created with Dragon Medical transcription system. Any errors in dictation are purely unintentional.

## 2023-06-28 NOTE — Interval H&P Note (Signed)
History and Physical Interval Note:  06/28/2023 11:34 AM  Deanna [name]  has presented today for surgery, with the diagnosis of R arm fistulagram   End Stage Renal.  The various methods of treatment have been discussed with the patient and family. After consideration of risks, benefits and other options for treatment, the patient has consented to  Procedure(s): A/V Fistulagram (Right) as a surgical intervention.  The patient's history has been reviewed, patient examined, no change in status, stable for surgery.  I have reviewed the patient's chart and labs.  Questions were answered to the patient's satisfaction.     Festus Barren

## 2023-06-28 NOTE — Telephone Encounter (Signed)
I was asked if I could move this patient to a different day. I attempted to make contact and left a message. The patient called back and was advised of moving the appt and emphatically stated no! Patient then called back to state that her transportation could not be changed.

## 2023-07-03 ENCOUNTER — Encounter: Payer: Self-pay | Admitting: Vascular Surgery

## 2023-07-17 ENCOUNTER — Other Ambulatory Visit (INDEPENDENT_AMBULATORY_CARE_PROVIDER_SITE_OTHER): Payer: Self-pay | Admitting: Vascular Surgery

## 2023-07-17 DIAGNOSIS — N186 End stage renal disease: Secondary | ICD-10-CM

## 2023-07-18 ENCOUNTER — Ambulatory Visit (INDEPENDENT_AMBULATORY_CARE_PROVIDER_SITE_OTHER): Payer: Medicare HMO

## 2023-07-18 ENCOUNTER — Encounter (INDEPENDENT_AMBULATORY_CARE_PROVIDER_SITE_OTHER): Payer: Self-pay | Admitting: Nurse Practitioner

## 2023-07-18 ENCOUNTER — Ambulatory Visit (INDEPENDENT_AMBULATORY_CARE_PROVIDER_SITE_OTHER): Payer: Medicare HMO | Admitting: Nurse Practitioner

## 2023-07-18 VITALS — BP 120/67 | HR 84 | Resp 18 | Ht 73.5 in | Wt 158.0 lb

## 2023-07-18 DIAGNOSIS — I739 Peripheral vascular disease, unspecified: Secondary | ICD-10-CM

## 2023-07-18 DIAGNOSIS — Z992 Dependence on renal dialysis: Secondary | ICD-10-CM

## 2023-07-18 DIAGNOSIS — N186 End stage renal disease: Secondary | ICD-10-CM

## 2023-07-18 DIAGNOSIS — E1122 Type 2 diabetes mellitus with diabetic chronic kidney disease: Secondary | ICD-10-CM

## 2023-07-18 DIAGNOSIS — Z794 Long term (current) use of insulin: Secondary | ICD-10-CM

## 2023-07-18 DIAGNOSIS — R531 Weakness: Secondary | ICD-10-CM

## 2023-07-18 NOTE — Progress Notes (Signed)
Subjective:    Patient ID: [name], female    DOB: 1951-05-16, 72 y.o.   MRN: [id number] Chief Complaint  Patient presents with   Follow-up    2 week follow up with HDA    The patient returns today for follow-up following recent fistulogram of her right forearm AV loop graft.  She has not yet utilized the access yet but there is increase in flow volume from 6 59-11 25.  Additionally she complains about numbness in her right arm that happens when they pull off too much following dialysis.  Upon further probing it is discovered that this numbness in her right arm has been taking place since she had her stroke in April 2023.  She endorses that is been happening since before her graft was placed.  Additionally she notes that she has lower extremity numbness during these episodes as well.  She currently denies claudication-like symptoms but she does not walk significantly due to the injury in her foot.  She has upcoming surgery tentatively planned but there is some concern due to her evidence of peripheral arterial disease on previous studies.  Her previous ABI shows an ABI of 1.01 on the right with a TBI 0.92 and 1.31 on the left with a TBI 0.94.  She has monophasic tibial waveforms in the right with biphasic/monophasic waveforms in the left.    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   required intubation   Anemia    C3 cervical fracture (HCC) 04/2023   CHF (congestive heart failure) (HCC)    combined systolic and diastolic   Coronary artery disease    ESRD on dialysis (HCC)    T-Th-Sat   GERD (gastroesophageal reflux disease)    Hyperlipidemia    Hypertension    Pneumonia    Secondary hyperparathyroidism of renal origin (HCC)    Stroke (HCC)    with  right sided weakness   Type 2 diabetes mellitus with diabetic neuropathy (HCC)     Social History   Socioeconomic History   Marital status: Single    Spouse name: Not on file   Number of children: 3   Years of education: Not on file   Highest education level: Not on file  Occupational History   Not on file  Tobacco Use   Smoking status: Never   Smokeless tobacco: Never  Vaping Use   Vaping status: Never Used  Substance and Sexual Activity   Alcohol use: No   Drug use: No   Sexual activity: Not Currently  Other Topics Concern   Not on file  Social History Narrative   Not on file   Social Determinants of Health   Financial Resource Strain: Not on file  Food Insecurity: No Food Insecurity (05/18/2023)   Hunger Vital Sign    Worried About Running Out of Food in the Last Year: Never true    Ran Out of Food in the Last Year: Never true  Transportation Needs: No Transportation Needs (05/18/2023)   PRAPARE - Transportation    Lack of Transportation (Medical): No    Lack of Transportation (Non-Medical): No  Physical Activity: Not on file  Stress: Not on file  Social Connections: Not on file  Intimate Partner Violence: Not At Risk (05/18/2023)   Humiliation, Afraid, Rape, and Kick questionnaire    Fear of Current or Ex-Partner: No    Emotionally Abused: No    Physically Abused: No    Sexually Abused: No    Past Surgical History:  Procedure Laterality Date   A/V FISTULAGRAM Right 06/28/2023   Procedure: A/V Fistulagram;  Surgeon: Annice Needy, MD;  Location: [facility];  Service: Cardiovascular;  Laterality: Right;   AV FISTULA PLACEMENT Right 05/17/2023   Procedure: INSERTION OF ARTERIOVENOUS (AV) GORE-TEX GRAFT ARM;  Surgeon: Annice Needy, MD;  Location: [facility];  Service: Vascular;  Laterality: Right;   CARDIAC CATHETERIZATION Left 03/01/2016   Procedure: Left Heart Cath and Coronary Angiography;  Surgeon: Dalia Heading, MD;  Location: [facility];   Service: Cardiovascular;  Laterality: Left;   CESAREAN SECTION     DIALYSIS/PERMA CATHETER INSERTION Right 01/10/2022   Procedure: DIALYSIS/PERMA CATHETER INSERTION;  Surgeon: Bertram Denver, MD;  Location: [facility];  Service: Cardiovascular;  Laterality: Right;   DIALYSIS/PERMA CATHETER INSERTION N/A 04/12/2022   Procedure: DIALYSIS/PERMA CATHETER INSERTION;  Surgeon: Annice Needy, MD;  Location: [facility];  Service: Cardiovascular;  Laterality: N/A;   INTUBATION-ENDOTRACHEAL WITH TRACHEOSTOMY STANDBY Left 01/14/2022   Procedure: INTUBATION-NASAL WITH TRACHEOSTOMY STANDBY;  Surgeon: Linus Salmons, MD;  Location: [facility];  Service: ENT;  Laterality: Left;   TEMPORARY DIALYSIS CATHETER N/A 01/04/2022   Procedure: TEMPORARY DIALYSIS CATHETER;  Surgeon: Annice Needy, MD;  Location: [facility];  Service: Cardiovascular;  Laterality: N/A;    Family History  Problem Relation Age of Onset   Osteoarthritis Brother     Allergies  Allergen Reactions   Ace Inhibitors Swelling   Entresto [Sacubitril-Valsartan] Swelling    Patient said she took both lisinopril and Entresto so it is not clear which one she actually reacted to.  For safety reasons, she has been advised to avoid both ACE inhibitors and Entresto.   Lisinopril Swelling    Severe Angioedema (requiring Nasotracheal intubation)   Penicillins Rash   Tomato Rash       Latest Ref Rng & Units 05/19/2023    4:14 AM 05/18/2023    6:00 AM 05/17/2023    6:50 PM  CBC  WBC 4.0 - 10.5 K/uL 9.4  9.8  10.9   Hemoglobin 12.0 - 15.0 g/dL 9.7  13.0  86.5   Hematocrit 36.0 - 46.0 % 30.1  32.5  35.6   Platelets 150 - 400 K/uL 210  227  261       CMP     Component Value Date/Time   NA 137 05/19/2023 0414   K 3.6 05/19/2023 0414   CL 100 05/19/2023 0414   CO2 29 05/19/2023 0414   GLUCOSE 89 05/19/2023 0414   BUN 24 (H) 05/19/2023 0414   CREATININE 4.61 (H) 05/19/2023 0414   CALCIUM 8.5 (L) 05/19/2023  0414   PROT 8.5 (H) 04/28/2023 1136   ALBUMIN 3.8 04/28/2023 1136   AST 16 04/28/2023 1136   ALT 15 04/28/2023 1136   ALKPHOS 113 04/28/2023 1136   BILITOT 0.5 04/28/2023 1136   GFRNONAA 10 (L) 05/19/2023 0414     No results found.     Assessment & Plan:   1. ESRD (end stage renal disease) (HCC) Today the patient has a good flow volume with no areas of significant stenosis in her  loop graft.  Based on this she can move forward with utilization.  Will plan on rechecking the access in approximately 6 months.  2. Right sided weakness The patient's right-sided weakness has actually been going on since April 2023, long before her dialysis access was placed.  Additionally the weakness and cramping sensation happens in both upper and lower legs.  Given that the symptoms were occurring long before her dialysis access placement, I do not believe that although they are caused by steal syndrome.  It certainly possible that some of this is related to a musculoskeletal cause but I feel it would be prudent to have her evaluated by neurology as well.  She is currently being seen by neurosurgery after her recent fall.  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 Type 2 diabetes mellitus with chronic kidney disease on chronic dialysis, with long-term current use of insulin (HCC) Continue antiarrhythmia medications as already ordered, these medications have been reviewed and there are no changes at this time.  Continue anticoagulation as ordered by Cardiology Service  4. PAD (peripheral artery disease) (HCC) Previous noninvasive studies had 2 pressures which indicate that the patient she will have adequate perfusion for healing.  However she additionally does have some monophasic tibial artery waveforms noted.  Given her significant risk factors and the concern for ongoing healing, in order to ensure that the patient has  the best chance of healing with the we will plan on having the patient undergo angiogram of the lower extremity in order to determine if there may be some areas of stenosis that may be better treated prior to her upcoming surgery.  I discussed the risk benefits alternatives with the patient and she is agreeable to proceed.   Current Outpatient Medications on File Prior to Visit  Medication Sig Dispense Refill   aspirin EC 81 MG tablet Take 81 mg by mouth daily.     atorvastatin (LIPITOR) 40 MG tablet Take 40 mg by mouth daily.     ferrous sulfate 325 (65 FE) MG EC tablet Take 325 mg by mouth in the morning.     furosemide (LASIX) 20 MG tablet Take 20 mg by mouth daily.     gabapentin (NEURONTIN) 100 MG capsule Take 1 capsule (100 mg total) by mouth 2 (two) times daily. 60 capsule 1   metoprolol tartrate (LOPRESSOR) 25 MG tablet Take 1 tablet (25 mg total) by mouth 2 (two) times daily. 60 tablet 0   oxyCODONE-acetaminophen (PERCOCET) 5-325 MG tablet Take 1 tablet by mouth every 4 (four) hours as needed for severe pain. 20 tablet 0   sevelamer (RENAGEL) 800 MG tablet Take 1,600 mg by mouth 3 (three) times daily with meals.     Vitamin D, Ergocalciferol, (DRISDOL) 1.25 MG (50000 UNIT) CAPS capsule Take 50,000 Units by mouth once a week. Wednesday     No current facility-administered medications on file prior to visit.    There are no Patient Instructions on file for this visit. No follow-ups on file.   Georgiana Spinner, NP

## 2023-07-18 NOTE — Progress Notes (Incomplete)
Subjective:    Patient ID: [name], female    DOB: [birth year], 72 y.o.   MRN: [id number] Chief Complaint  Patient presents with  . Follow-up    2 week follow up with HDA    HPI  Review of Systems     Objective:   Physical Exam  BP 120/67 (BP Location: Left Wrist)   Pulse 84   Resp 18   Ht 6' 1.5" (1.867 m)   Wt 158 lb (71.7 kg)   BMI 20.56 kg/m   Past Medical History:  Diagnosis Date  . 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 Anemia   . C3 cervical fracture (HCC) 04/2023  . CHF (congestive heart failure) (HCC)    combined systolic and diastolic  . Coronary artery disease   . ESRD on dialysis (HCC)    T-Th-Sat  . GERD (gastroesophageal reflux disease)   . Hyperlipidemia   . Hypertension   . Pneumonia   . Secondary hyperparathyroidism of renal origin (HCC)   . Stroke [facility])    with right sided weakness  . Type 2 diabetes mellitus with diabetic neuropathy (HCC)     Social History   Socioeconomic History  . Marital status: Single    Spouse name: Not on file  . Number of children: 3  . Years of education: Not on file  . Highest education level: Not on file  Occupational History  . Not on file  Tobacco Use  . Smoking status: Never  . Smokeless tobacco: Never  Vaping Use  . Vaping status: Never Used  Substance and Sexual Activity  . Alcohol use: No  . Drug use: No  . Sexual activity: Not Currently  Other Topics Concern  . Not on file  Social History Narrative  . Not on file   Social Determinants of Health   Financial Resource Strain: Not on file  Food Insecurity: No Food Insecurity (05/18/2023)   Hunger Vital Sign   . Worried About Programme researcher, broadcasting/film/video in the Last Year: Never true   . Ran Out of Food in the Last Year: Never true  Transportation Needs: No Transportation Needs (05/18/2023)   PRAPARE - Transportation   . Lack of Transportation (Medical): No   . Lack of Transportation (Non-Medical): No  Physical  Activity: Not on file  Stress: Not on file  Social Connections: Not on file  Intimate Partner Violence: Not At Risk (05/18/2023)   Humiliation, Afraid, Rape, and Kick questionnaire   . Fear of Current or Ex-Partner: No   . Emotionally Abused: No   . Physically Abused: No   . Sexually Abused: No    Past Surgical History:  Procedure Laterality Date  . A/V FISTULAGRAM Right 06/28/2023   Procedure: A/V Fistulagram;  Surgeon: Annice Needy, MD;  Location: [facility];  Service: Cardiovascular;  Laterality: Right;  . AV FISTULA PLACEMENT Right 05/17/2023   Procedure: INSERTION OF ARTERIOVENOUS (AV) GORE-TEX GRAFT ARM;  Surgeon: Annice Needy, MD;  Location: [facility];  Service: Vascular;  Laterality: Right;  . CARDIAC CATHETERIZATION Left 03/01/2016   Procedure: Left Heart Cath and Coronary Angiography;  Surgeon: Dalia Heading, MD;  Location: [facility];  Service: Cardiovascular;  Laterality: Left;  . CESAREAN SECTION    . DIALYSIS/PERMA CATHETER INSERTION Right 01/10/2022   Procedure: DIALYSIS/PERMA CATHETER INSERTION;  Surgeon: Bertram Denver, MD;  Location: [facility];  Service: Cardiovascular;  Laterality: Right;  . DIALYSIS/PERMA  CATHETER INSERTION N/A 04/12/2022   Procedure: DIALYSIS/PERMA CATHETER INSERTION;  Surgeon: Annice Needy, MD;  Location: [facility];  Service: Cardiovascular;  Laterality: N/A;  . INTUBATION-ENDOTRACHEAL WITH TRACHEOSTOMY STANDBY Left 01/14/2022   Procedure: INTUBATION-NASAL WITH TRACHEOSTOMY STANDBY;  Surgeon: Linus Salmons, MD;  Location: [facility];  Service: ENT;  Laterality: Left;  . TEMPORARY DIALYSIS CATHETER N/A 01/04/2022   Procedure: TEMPORARY DIALYSIS CATHETER;  Surgeon: Annice Needy, MD;  Location: [facility];  Service: Cardiovascular;  Laterality: N/A;    Family History  Problem Relation Age of Onset  . Osteoarthritis Brother     Allergies  Allergen Reactions  . Ace Inhibitors Swelling  .  Entresto [Sacubitril-Valsartan] Swelling    Patient said she took both lisinopril and Entresto so it is not clear which one she actually reacted to.  For safety reasons, she has been advised to avoid both ACE inhibitors and Entresto.  Marland Kitchen Lisinopril Swelling    Severe Angioedema (requiring Nasotracheal intubation)  . Penicillins Rash  . Tomato Rash       Latest Ref Rng & Units 05/19/2023    4:14 AM 05/18/2023    6:00 AM 05/17/2023    6:50 PM  CBC  WBC 4.0 - 10.5 K/uL 9.4  9.8  10.9   Hemoglobin 12.0 - 15.0 g/dL 9.7  54.0  98.1   Hematocrit 36.0 - 46.0 % 30.1  32.5  35.6   Platelets 150 - 400 K/uL 210  227  261       CMP     Component Value Date/Time   NA 137 05/19/2023 0414   K 3.6 05/19/2023 0414   CL 100 05/19/2023 0414   CO2 29 05/19/2023 0414   GLUCOSE 89 05/19/2023 0414   BUN 24 (H) 05/19/2023 0414   CREATININE 4.61 (H) 05/19/2023 0414   CALCIUM 8.5 (L) 05/19/2023 0414   PROT 8.5 (H) 04/28/2023 1136   ALBUMIN 3.8 04/28/2023 1136   AST 16 04/28/2023 1136   ALT 15 04/28/2023 1136   ALKPHOS 113 04/28/2023 1136   BILITOT 0.5 04/28/2023 1136   GFRNONAA 10 (L) 05/19/2023 0414     No results found.     Assessment & Plan:   1. ESRD (end stage renal disease) (HCC) Today the patient has a good flow volume with no areas of significant stenosis in her loop graft.  Based on this she can move forward with utilization.  Will plan on rechecking the access in approximately 6 months.  2. Right sided weakness The patient's right-sided weakness has actually been going on since April 2023, long before her dialysis access was placed.  Additionally the weakness and cramping sensation happens in both upper and lower legs.  Given that the symptoms were occurring long before her dialysis access placement, I do not believe that although they are caused by steal syndrome.  It certainly possible that some of this is related to a musculoskeletal cause but I feel it would be prudent to have her  evaluated by neurology as well.  She is currently being seen by neurosurgery after her recent fall.  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 Type 2 diabetes mellitus with chronic kidney disease on chronic dialysis, with long-term current use of insulin (HCC) Continue antiarrhythmia medications as already ordered, these medications have been reviewed and there are no changes at this time.  Continue anticoagulation as ordered by Cardiology  Service  4. PAD (peripheral artery disease) (HCC) Previous noninvasive studies had 2 pressures which indicate that the patient she will have adequate perfusion for healing.  However she additionally does have some monophasic tibial artery waveforms noted.  Given her significant risk factors and the concern for ongoing healing, in order to ensure that the patient has the best chance of healing with the we will plan on having the patient undergo angiogram of the lower extremity in order to determine if there may be some areas of stenosis that may be better treated prior to her upcoming surgery.  I discussed the risk benefits alternatives with the patient and she is agreeable to proceed.   Current Outpatient Medications on File Prior to Visit  Medication Sig Dispense Refill  . aspirin EC 81 MG tablet Take 81 mg by mouth daily.    Marland Kitchen atorvastatin (LIPITOR) 40 MG tablet Take 40 mg by mouth daily.    . ferrous sulfate 325 (65 FE) MG EC tablet Take 325 mg by mouth in the morning.    . furosemide (LASIX) 20 MG tablet Take 20 mg by mouth daily.    Marland Kitchen gabapentin (NEURONTIN) 100 MG capsule Take 1 capsule (100 mg total) by mouth 2 (two) times daily. 60 capsule 1  . metoprolol tartrate (LOPRESSOR) 25 MG tablet Take 1 tablet (25 mg total) by mouth 2 (two) times daily. 60 tablet 0  . oxyCODONE-acetaminophen (PERCOCET) 5-325 MG tablet Take 1 tablet by mouth every 4 (four) hours as needed for  severe pain. 20 tablet 0  . sevelamer (RENAGEL) 800 MG tablet Take 1,600 mg by mouth 3 (three) times daily with meals.    . Vitamin D, Ergocalciferol, (DRISDOL) 1.25 MG (50000 UNIT) CAPS capsule Take 50,000 Units by mouth once a week. Wednesday     No current facility-administered medications on file prior to visit.    There are no Patient Instructions on file for this visit. No follow-ups on file.   Georgiana Spinner, NP

## 2023-07-18 NOTE — H&P (View-Only) (Signed)
Subjective:    Patient ID: Deanna [name], female    DOB: 1951-05-16, 72 y.o.   MRN: [id number] Chief Complaint  Patient presents with   Follow-up    2 week follow up with HDA    The patient returns today for follow-up following recent fistulogram of her right forearm AV loop graft.  She has not yet utilized the access yet but there is increase in flow volume from 6 59-11 25.  Additionally she complains about numbness in her right arm that happens when they pull off too much following dialysis.  Upon further probing it is discovered that this numbness in her right arm has been taking place since she had her stroke in April 2023.  She endorses that is been happening since before her graft was placed.  Additionally she notes that she has lower extremity numbness during these episodes as well.  She currently denies claudication-like symptoms but she does not walk significantly due to the injury in her foot.  She has upcoming surgery tentatively planned but there is some concern due to her evidence of peripheral arterial disease on previous studies.  Her previous ABI shows an ABI of 1.01 on the right with a TBI 0.92 and 1.31 on the left with a TBI 0.94.  She has monophasic tibial waveforms in the right with biphasic/monophasic waveforms in the left.    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   required intubation   Anemia    C3 cervical fracture (HCC) 04/2023   CHF (congestive heart failure) (HCC)    combined systolic and diastolic   Coronary artery disease    ESRD on dialysis (HCC)    T-Th-Sat   GERD (gastroesophageal reflux disease)    Hyperlipidemia    Hypertension    Pneumonia    Secondary hyperparathyroidism of renal origin (HCC)    Stroke (HCC)    with  right sided weakness   Type 2 diabetes mellitus with diabetic neuropathy (HCC)     Social History   Socioeconomic History   Marital status: Single    Spouse name: Not on file   Number of children: 3   Years of education: Not on file   Highest education level: Not on file  Occupational History   Not on file  Tobacco Use   Smoking status: Never   Smokeless tobacco: Never  Vaping Use   Vaping status: Never Used  Substance and Sexual Activity   Alcohol use: No   Drug use: No   Sexual activity: Not Currently  Other Topics Concern   Not on file  Social History Narrative   Not on file   Social Determinants of Health   Financial Resource Strain: Not on file  Food Insecurity: No Food Insecurity (05/18/2023)   Hunger Vital Sign    Worried About Running Out of Food in the Last Year: Never true    Ran Out of Food in the Last Year: Never true  Transportation Needs: No Transportation Needs (05/18/2023)   PRAPARE - Transportation    Lack of Transportation (Medical): No    Lack of Transportation (Non-Medical): No  Physical Activity: Not on file  Stress: Not on file  Social Connections: Not on file  Intimate Partner Violence: Not At Risk (05/18/2023)   Humiliation, Afraid, Rape, and Kick questionnaire    Fear of Current or Ex-Partner: No    Emotionally Abused: No    Physically Abused: No    Sexually Abused: No    Past Surgical History:  Procedure Laterality Date   A/V FISTULAGRAM Right 06/28/2023   Procedure: A/V Fistulagram;  Surgeon: Annice Needy, MD;  Location: [facility];  Service: Cardiovascular;  Laterality: Right;   AV FISTULA PLACEMENT Right 05/17/2023   Procedure: INSERTION OF ARTERIOVENOUS (AV) GORE-TEX GRAFT ARM;  Surgeon: Annice Needy, MD;  Location: [facility];  Service: Vascular;  Laterality: Right;   CARDIAC CATHETERIZATION Left 03/01/2016   Procedure: Left Heart Cath and Coronary Angiography;  Surgeon: Dalia Heading, MD;  Location: [facility];   Service: Cardiovascular;  Laterality: Left;   CESAREAN SECTION     DIALYSIS/PERMA CATHETER INSERTION Right 01/10/2022   Procedure: DIALYSIS/PERMA CATHETER INSERTION;  Surgeon: Bertram Denver, MD;  Location: [facility];  Service: Cardiovascular;  Laterality: Right;   DIALYSIS/PERMA CATHETER INSERTION N/A 04/12/2022   Procedure: DIALYSIS/PERMA CATHETER INSERTION;  Surgeon: Annice Needy, MD;  Location: [facility];  Service: Cardiovascular;  Laterality: N/A;   INTUBATION-ENDOTRACHEAL WITH TRACHEOSTOMY STANDBY Left 01/14/2022   Procedure: INTUBATION-NASAL WITH TRACHEOSTOMY STANDBY;  Surgeon: Linus Salmons, MD;  Location: [facility];  Service: ENT;  Laterality: Left;   TEMPORARY DIALYSIS CATHETER N/A 01/04/2022   Procedure: TEMPORARY DIALYSIS CATHETER;  Surgeon: Annice Needy, MD;  Location: [facility];  Service: Cardiovascular;  Laterality: N/A;    Family History  Problem Relation Age of Onset   Osteoarthritis Brother     Allergies  Allergen Reactions   Ace Inhibitors Swelling   Entresto [Sacubitril-Valsartan] Swelling    Patient said she took both lisinopril and Entresto so it is not clear which one she actually reacted to.  For safety reasons, she has been advised to avoid both ACE inhibitors and Entresto.   Lisinopril Swelling    Severe Angioedema (requiring Nasotracheal intubation)   Penicillins Rash   Tomato Rash       Latest Ref Rng & Units 05/19/2023    4:14 AM 05/18/2023    6:00 AM 05/17/2023    6:50 PM  CBC  WBC 4.0 - 10.5 K/uL 9.4  9.8  10.9   Hemoglobin 12.0 - 15.0 g/dL 9.7  13.0  86.5   Hematocrit 36.0 - 46.0 % 30.1  32.5  35.6   Platelets 150 - 400 K/uL 210  227  261       CMP     Component Value Date/Time   NA 137 05/19/2023 0414   K 3.6 05/19/2023 0414   CL 100 05/19/2023 0414   CO2 29 05/19/2023 0414   GLUCOSE 89 05/19/2023 0414   BUN 24 (H) 05/19/2023 0414   CREATININE 4.61 (H) 05/19/2023 0414   CALCIUM 8.5 (L) 05/19/2023  0414   PROT 8.5 (H) 04/28/2023 1136   ALBUMIN 3.8 04/28/2023 1136   AST 16 04/28/2023 1136   ALT 15 04/28/2023 1136   ALKPHOS 113 04/28/2023 1136   BILITOT 0.5 04/28/2023 1136   GFRNONAA 10 (L) 05/19/2023 0414     No results found.     Assessment & Plan:   1. ESRD (end stage renal disease) (HCC) Today the patient has a good flow volume with no areas of significant stenosis in her  loop graft.  Based on this she can move forward with utilization.  Will plan on rechecking the access in approximately 6 months.  2. Right sided weakness The patient's right-sided weakness has actually been going on since April 2023, long before her dialysis access was placed.  Additionally the weakness and cramping sensation happens in both upper and lower legs.  Given that the symptoms were occurring long before her dialysis access placement, I do not believe that although they are caused by steal syndrome.  It certainly possible that some of this is related to a musculoskeletal cause but I feel it would be prudent to have her evaluated by neurology as well.  She is currently being seen by neurosurgery after her recent fall.  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 Type 2 diabetes mellitus with chronic kidney disease on chronic dialysis, with long-term current use of insulin (HCC) Continue antiarrhythmia medications as already ordered, these medications have been reviewed and there are no changes at this time.  Continue anticoagulation as ordered by Cardiology Service  4. PAD (peripheral artery disease) (HCC) Previous noninvasive studies had 2 pressures which indicate that the patient she will have adequate perfusion for healing.  However she additionally does have some monophasic tibial artery waveforms noted.  Given her significant risk factors and the concern for ongoing healing, in order to ensure that the patient has  the best chance of healing with the we will plan on having the patient undergo angiogram of the lower extremity in order to determine if there may be some areas of stenosis that may be better treated prior to her upcoming surgery.  I discussed the risk benefits alternatives with the patient and she is agreeable to proceed.   Current Outpatient Medications on File Prior to Visit  Medication Sig Dispense Refill   aspirin EC 81 MG tablet Take 81 mg by mouth daily.     atorvastatin (LIPITOR) 40 MG tablet Take 40 mg by mouth daily.     ferrous sulfate 325 (65 FE) MG EC tablet Take 325 mg by mouth in the morning.     furosemide (LASIX) 20 MG tablet Take 20 mg by mouth daily.     gabapentin (NEURONTIN) 100 MG capsule Take 1 capsule (100 mg total) by mouth 2 (two) times daily. 60 capsule 1   metoprolol tartrate (LOPRESSOR) 25 MG tablet Take 1 tablet (25 mg total) by mouth 2 (two) times daily. 60 tablet 0   oxyCODONE-acetaminophen (PERCOCET) 5-325 MG tablet Take 1 tablet by mouth every 4 (four) hours as needed for severe pain. 20 tablet 0   sevelamer (RENAGEL) 800 MG tablet Take 1,600 mg by mouth 3 (three) times daily with meals.     Vitamin D, Ergocalciferol, (DRISDOL) 1.25 MG (50000 UNIT) CAPS capsule Take 50,000 Units by mouth once a week. Wednesday     No current facility-administered medications on file prior to visit.    There are no Patient Instructions on file for this visit. No follow-ups on file.   Georgiana Spinner, NP

## 2023-07-19 ENCOUNTER — Telehealth (INDEPENDENT_AMBULATORY_CARE_PROVIDER_SITE_OTHER): Payer: Self-pay

## 2023-07-19 NOTE — Telephone Encounter (Signed)
I attempted to contact the patient to be scheduled for a RLE angio with Dr. Wyn Quaker. A message was left for a return call. I then contact the patient's son and he stated he would have her contact me.

## 2023-07-20 ENCOUNTER — Telehealth (INDEPENDENT_AMBULATORY_CARE_PROVIDER_SITE_OTHER): Payer: Self-pay

## 2023-07-20 NOTE — Telephone Encounter (Signed)
I spoke with the patient and she is scheduled with Dr. Wyn Quaker on 07/23/23 for a RLE angio with a 8:45 am arrival time to the [facility]. Pre-procedure instructions were discussed and patient stated she understood. Patient does not have Mychart and it is too late to mail. I did ask if she wanted to write this information down and she stated she understood.

## 2023-07-23 ENCOUNTER — Encounter
Admission: RE | Disposition: A | Discharge status: Home / Self Care | Payer: Self-pay | Source: Home / Self Care | Attending: Vascular Surgery

## 2023-07-23 ENCOUNTER — Other Ambulatory Visit: Payer: Self-pay

## 2023-07-23 ENCOUNTER — Ambulatory Visit
Admission: RE | Admit: 2023-07-23 | Discharge: 2023-07-23 | Disposition: A | Discharge status: Home / Self Care | Payer: Medicare HMO | Attending: Vascular Surgery | Admitting: Vascular Surgery

## 2023-07-23 ENCOUNTER — Encounter: Payer: Self-pay | Admitting: Vascular Surgery

## 2023-07-23 DIAGNOSIS — I132 Hypertensive heart and chronic kidney disease with heart failure and with stage 5 chronic kidney disease, or end stage renal disease: Secondary | ICD-10-CM | POA: Insufficient documentation

## 2023-07-23 DIAGNOSIS — I5040 Unspecified combined systolic (congestive) and diastolic (congestive) heart failure: Secondary | ICD-10-CM | POA: Insufficient documentation

## 2023-07-23 DIAGNOSIS — N19 Unspecified kidney failure: Secondary | ICD-10-CM

## 2023-07-23 DIAGNOSIS — E1151 Type 2 diabetes mellitus with diabetic peripheral angiopathy without gangrene: Secondary | ICD-10-CM | POA: Insufficient documentation

## 2023-07-23 DIAGNOSIS — Z8673 Personal history of transient ischemic attack (TIA), and cerebral infarction without residual deficits: Secondary | ICD-10-CM | POA: Diagnosis not present

## 2023-07-23 DIAGNOSIS — M79605 Pain in left leg: Secondary | ICD-10-CM | POA: Diagnosis not present

## 2023-07-23 DIAGNOSIS — R531 Weakness: Secondary | ICD-10-CM | POA: Insufficient documentation

## 2023-07-23 DIAGNOSIS — Z79899 Other long term (current) drug therapy: Secondary | ICD-10-CM | POA: Diagnosis not present

## 2023-07-23 DIAGNOSIS — Z794 Long term (current) use of insulin: Secondary | ICD-10-CM | POA: Insufficient documentation

## 2023-07-23 DIAGNOSIS — N186 End stage renal disease: Secondary | ICD-10-CM | POA: Insufficient documentation

## 2023-07-23 DIAGNOSIS — I70211 Atherosclerosis of native arteries of extremities with intermittent claudication, right leg: Secondary | ICD-10-CM

## 2023-07-23 DIAGNOSIS — E1122 Type 2 diabetes mellitus with diabetic chronic kidney disease: Secondary | ICD-10-CM | POA: Insufficient documentation

## 2023-07-23 DIAGNOSIS — E1140 Type 2 diabetes mellitus with diabetic neuropathy, unspecified: Secondary | ICD-10-CM | POA: Diagnosis not present

## 2023-07-23 DIAGNOSIS — Z7901 Long term (current) use of anticoagulants: Secondary | ICD-10-CM | POA: Insufficient documentation

## 2023-07-23 DIAGNOSIS — Z992 Dependence on renal dialysis: Secondary | ICD-10-CM | POA: Insufficient documentation

## 2023-07-23 DIAGNOSIS — I70219 Atherosclerosis of native arteries of extremities with intermittent claudication, unspecified extremity: Secondary | ICD-10-CM

## 2023-07-23 HISTORY — PX: LOWER EXTREMITY ANGIOGRAPHY: CATH118251

## 2023-07-23 LAB — POTASSIUM (ARMC VASCULAR LAB ONLY): Potassium (ARMC vascular lab): 4.3 mmol/L (ref 3.5–5.1)

## 2023-07-23 SURGERY — LOWER EXTREMITY ANGIOGRAPHY
Anesthesia: Moderate Sedation | Site: Leg Lower | Laterality: Right

## 2023-07-23 MED ORDER — HEPARIN (PORCINE) IN NACL 2000-0.9 UNIT/L-% IV SOLN
INTRAVENOUS | Status: DC | PRN
  Administered 2023-07-23: 1000 mL

## 2023-07-23 MED ORDER — LABETALOL HCL 5 MG/ML IV SOLN: 10.0000 mg | INTRAVENOUS | Status: DC | PRN

## 2023-07-23 MED ORDER — HYDRALAZINE HCL 20 MG/ML IJ SOLN: 5.0000 mg | INTRAMUSCULAR | Status: DC | PRN

## 2023-07-23 MED ORDER — CEFAZOLIN SODIUM-DEXTROSE 1-4 GM/50ML-% IV SOLN
1.0000 g | INTRAVENOUS | Status: AC
  Administered 2023-07-23: 1 g via INTRAVENOUS

## 2023-07-23 MED ORDER — CLOPIDOGREL BISULFATE 75 MG PO TABS: 75.0000 mg | ORAL_TABLET | Freq: Every day | ORAL | 11 refills | Status: DC

## 2023-07-23 MED ORDER — HEPARIN SODIUM (PORCINE) 1000 UNIT/ML IJ SOLN
INTRAMUSCULAR | Status: AC
  Filled 2023-07-23: qty 10

## 2023-07-23 MED ORDER — SODIUM CHLORIDE 0.9% FLUSH: 3.0000 mL | INTRAVENOUS | Status: DC | PRN

## 2023-07-23 MED ORDER — FENTANYL CITRATE (PF) 100 MCG/2ML IJ SOLN
INTRAMUSCULAR | Status: DC | PRN
  Administered 2023-07-23: 25 ug via INTRAVENOUS
  Administered 2023-07-23: 50 ug via INTRAVENOUS
  Administered 2023-07-23: 25 ug via INTRAVENOUS

## 2023-07-23 MED ORDER — FAMOTIDINE 20 MG PO TABS: 40.0000 mg | ORAL_TABLET | Freq: Once | ORAL | Status: DC | PRN

## 2023-07-23 MED ORDER — LIDOCAINE-EPINEPHRINE (PF) 1 %-1:200000 IJ SOLN
INTRAMUSCULAR | Status: DC | PRN
  Administered 2023-07-23: 10 mL

## 2023-07-23 MED ORDER — FENTANYL CITRATE (PF) 100 MCG/2ML IJ SOLN
INTRAMUSCULAR | Status: AC
  Filled 2023-07-23: qty 2

## 2023-07-23 MED ORDER — ONDANSETRON HCL 4 MG/2ML IJ SOLN
INTRAMUSCULAR | Status: AC
  Administered 2023-07-23: 4 mg
  Filled 2023-07-23: qty 2

## 2023-07-23 MED ORDER — SODIUM CHLORIDE 0.9% FLUSH: 3.0000 mL | Freq: Two times a day (BID) | INTRAVENOUS | Status: DC

## 2023-07-23 MED ORDER — CEFAZOLIN SODIUM-DEXTROSE 1-4 GM/50ML-% IV SOLN
INTRAVENOUS | Status: AC
  Filled 2023-07-23: qty 50

## 2023-07-23 MED ORDER — CLOPIDOGREL BISULFATE 75 MG PO TABS: 75.0000 mg | ORAL_TABLET | Freq: Every day | ORAL | Status: DC

## 2023-07-23 MED ORDER — SODIUM CHLORIDE 0.9 % IV SOLN: INTRAVENOUS | Status: DC

## 2023-07-23 MED ORDER — MIDAZOLAM HCL 5 MG/5ML IJ SOLN
INTRAMUSCULAR | Status: AC
  Filled 2023-07-23: qty 5

## 2023-07-23 MED ORDER — ONDANSETRON HCL 4 MG/2ML IJ SOLN: 4.0000 mg | Freq: Four times a day (QID) | INTRAMUSCULAR | Status: DC | PRN

## 2023-07-23 MED ORDER — MIDAZOLAM HCL 2 MG/ML PO SYRP: 8.0000 mg | ORAL_SOLUTION | Freq: Once | ORAL | Status: DC | PRN

## 2023-07-23 MED ORDER — HYDROMORPHONE HCL 1 MG/ML IJ SOLN
1.0000 mg | Freq: Once | INTRAMUSCULAR | Status: AC | PRN
  Administered 2023-07-23: 0.5 mg via INTRAVENOUS

## 2023-07-23 MED ORDER — METHYLPREDNISOLONE SODIUM SUCC 125 MG IJ SOLR: 125.0000 mg | Freq: Once | INTRAMUSCULAR | Status: DC | PRN

## 2023-07-23 MED ORDER — ACETAMINOPHEN 325 MG PO TABS
650.0000 mg | ORAL_TABLET | ORAL | Status: DC | PRN
Start: 2023-07-23 — End: 2023-07-23

## 2023-07-23 MED ORDER — MIDAZOLAM HCL 2 MG/2ML IJ SOLN
INTRAMUSCULAR | Status: DC | PRN
  Administered 2023-07-23 (×2): 1 mg via INTRAVENOUS

## 2023-07-23 MED ORDER — HYDROMORPHONE HCL 1 MG/ML IJ SOLN
INTRAMUSCULAR | Status: AC
  Filled 2023-07-23: qty 0.5

## 2023-07-23 MED ORDER — IODIXANOL 320 MG/ML IV SOLN
INTRAVENOUS | Status: DC | PRN
  Administered 2023-07-23: 45 mL via INTRA_ARTERIAL

## 2023-07-23 MED ORDER — HEPARIN SODIUM (PORCINE) 1000 UNIT/ML IJ SOLN
INTRAMUSCULAR | Status: DC | PRN
  Administered 2023-07-23: 4000 [IU] via INTRAVENOUS

## 2023-07-23 MED ORDER — SODIUM CHLORIDE 0.9 % IV SOLN
250.0000 mL | INTRAVENOUS | Status: DC | PRN
Start: 2023-07-23 — End: 2023-07-23

## 2023-07-23 MED ORDER — DIPHENHYDRAMINE HCL 50 MG/ML IJ SOLN: 50.0000 mg | Freq: Once | INTRAMUSCULAR | Status: DC | PRN

## 2023-07-23 SURGICAL SUPPLY — 17 items
BALLN LUTONIX DCB 6X100X130 (BALLOONS) ×1
BALLN ULTRVRSE 3X100X150 (BALLOONS) ×1
BALLOON LUTONIX DCB 6X100X130 (BALLOONS) IMPLANT
BALLOON ULTRVRSE 3X100X150 (BALLOONS) IMPLANT
CATH ANGIO 5F PIGTAIL 65CM (CATHETERS) IMPLANT
CATH VERT 5X100 (CATHETERS) IMPLANT
COVER PROBE ULTRASOUND 5X96 (MISCELLANEOUS) IMPLANT
DEVICE STARCLOSE SE CLOSURE (Vascular Products) IMPLANT
GLIDEWIRE ADV .035X260CM (WIRE) IMPLANT
KIT ENCORE 26 ADVANTAGE (KITS) IMPLANT
PACK ANGIOGRAPHY (CUSTOM PROCEDURE TRAY) ×1 IMPLANT
SHEATH BRITE TIP 5FRX11 (SHEATH) IMPLANT
SHEATH RAABE 6FRX70 (SHEATH) IMPLANT
SYR MEDRAD MARK 7 150ML (SYRINGE) IMPLANT
TUBING CONTRAST HIGH PRESS 72 (TUBING) IMPLANT
WIRE G V18X300CM (WIRE) IMPLANT
WIRE GUIDERIGHT .035X150 (WIRE) IMPLANT

## 2023-07-23 NOTE — Op Note (Signed)
[facility]  Percutaneous Study/Intervention Procedural Note   Date of Surgery: 07/23/2023  Surgeon(s):Altie Savard    Assistants:none  Pre-operative Diagnosis: PAD with claudication RLE  Post-operative diagnosis:  Same  Procedure(s) Performed:             1.  Ultrasound guidance for vascular access left femoral artery             2.  Catheter placement into right common femoral artery from left femoral approach             3.  Aortogram and selective right lower extremity angiogram             4.  Percutaneous transluminal angioplasty of right peroneal artery and tibioperoneal trunk with 3 mm diameter by 10 cm length angioplasty balloon             5.  Percutaneous transluminal angioplasty of the right distal SFA and proximal popliteal artery with 6 mm diameter by 10 cm length Lutonix drug-coated angioplasty balloon  6.  StarClose closure device left femoral artery  EBL: 5 cc  Contrast: 45 cc  Fluoro Time: 3.2 minutes  Moderate Conscious Sedation Time: approximately 36 minutes using 2 mg of Versed and 100 mcg of Fentanyl              Indications:  Patient is a 72 y.o.female with multiple medical issues including renal failure, neuropathy, diabetes and describing significant claudication symptoms and pain in both legs. The patient has noninvasive study showing reduced perfusion on the right with nearly normal flow on the left. The patient is brought in for angiography for further evaluation and potential treatment.  Risks and benefits are discussed and informed consent is obtained.   Procedure:  The patient was identified and appropriate procedural time out was performed.  The patient was then placed supine on the table and prepped and draped in the usual sterile fashion. Moderate conscious sedation was administered during a face to face encounter with the patient throughout the procedure with my supervision of the RN administering medicines and monitoring the  patient's vital signs, pulse oximetry, telemetry and mental status throughout from the start of the procedure until the patient was taken to the recovery room. Ultrasound was used to evaluate the left common femoral artery.  It was patent .  A digital ultrasound image was acquired.  A Seldinger needle was used to access the left common femoral artery under direct ultrasound guidance and a permanent image was performed.  A 0.035 J wire was advanced without resistance and a 5Fr sheath was placed.  Pigtail catheter was placed into the aorta and an AP aortogram was performed. This demonstrated normal renal arteries and normal aorta and iliac segments without significant stenosis. I then crossed the aortic bifurcation and advanced to the right femoral head. Selective right lower extremity angiogram was then performed. This demonstrated fairly normal common femoral artery, profunda femoris artery, and proximal and mid superficial femoral artery.  At [facility] canal there was about a 60 to 65% stenosis in the most distal SFA and proximal popliteal artery.  The remainder the popliteal artery normalized.  The tibial vessels showed the peroneal artery to be the only runoff distally and it was fairly large although there was about a 70 to 75% stenosis in the distal tibioperoneal trunk and proximal peroneal artery.  Both the anterior tibial and posterior tibial arteries were occluded with only reconstitution in the foot. It was felt that it was  in the patient's best interest to proceed with intervention after these images to avoid a second procedure and a larger amount of contrast and fluoroscopy based off of the findings from the initial angiogram. The patient was systemically heparinized and a 6 French 70 cm sheath was then placed over the Terumo Advantage wire. I then used a Kumpe catheter and the advantage wire to navigate through the Hunter's canal lesion and down into the popliteal artery where I used a Kumpe catheter and  exchanged for a V18 wire and cross the stenosis in the tibioperoneal trunk and proximal peroneal artery parking the wire distally.  A 3 mm diameter by 10 cm length angioplasty balloon was inflated in the peroneal artery and distal TP trunk and inflated up to 8 atm for 1 minute.  Completion imaging showed significant improvement with about a 20% residual stenosis.  I then turned my attention to the distal SFA and proximal popliteal artery lesion.  I inflated a 6 mm diameter by 10 cm length Lutonix drug-coated angioplasty balloon to 10 atm for 1 minute.  Completion imaging showed about a 20 to 25% residual stenosis which was not flow-limiting. I elected to terminate the procedure. The sheath was removed and StarClose closure device was deployed in the left femoral artery with excellent hemostatic result. The patient was taken to the recovery room in stable condition having tolerated the procedure well.  Findings:               Aortogram:  This demonstrated normal renal arteries and normal aorta and iliac segments without significant stenosis.             Right Lower Extremity:  This demonstrated fairly normal common femoral artery, profunda femoris artery, and proximal and mid superficial femoral artery.  At [facility] canal there was about a 60 to 65% stenosis in the most distal SFA and proximal popliteal artery.  The remainder the popliteal artery normalized.  The tibial vessels showed the peroneal artery to be the only runoff distally and it was fairly large although there was about a 70 to 75% stenosis in the distal tibioperoneal trunk and proximal peroneal artery.  Both the anterior tibial and posterior tibial arteries were occluded with only reconstitution in the foot.   Disposition: Patient was taken to the recovery room in stable condition having tolerated the procedure well.  Complications: None  Festus Barren 07/23/2023 10:42 AM   This note was created with Dragon Medical transcription system. Any  errors in dictation are purely unintentional.

## 2023-07-23 NOTE — Interval H&P Note (Signed)
History and Physical Interval Note:  07/23/2023 9:30 AM  Deanna [name]  has presented today for surgery, with the diagnosis of RLE Angio   ASO w claudication.  The various methods of treatment have been discussed with the patient and family. After consideration of risks, benefits and other options for treatment, the patient has consented to  Procedure(s): Lower Extremity Angiography (Right) as a surgical intervention.  The patient's history has been reviewed, patient examined, no change in status, stable for surgery.  I have reviewed the patient's chart and labs.  Questions were answered to the patient's satisfaction.     Festus Barren

## 2023-07-24 ENCOUNTER — Encounter: Payer: Self-pay | Admitting: Vascular Surgery

## 2023-07-24 ENCOUNTER — Other Ambulatory Visit: Payer: Self-pay | Admitting: Neurosurgery

## 2023-07-24 ENCOUNTER — Ambulatory Visit
Admission: RE | Admit: 2023-07-24 | Discharge: 2023-07-24 | Disposition: A | Discharge status: Home / Self Care | Payer: Medicare HMO | Source: Ambulatory Visit | Attending: Neurosurgery | Admitting: Neurosurgery

## 2023-07-24 DIAGNOSIS — S12201D Unspecified nondisplaced fracture of third cervical vertebra, subsequent encounter for fracture with routine healing: Secondary | ICD-10-CM | POA: Insufficient documentation

## 2023-07-24 DIAGNOSIS — S12201A Unspecified nondisplaced fracture of third cervical vertebra, initial encounter for closed fracture: Secondary | ICD-10-CM | POA: Diagnosis present

## 2023-07-25 ENCOUNTER — Ambulatory Visit (INDEPENDENT_AMBULATORY_CARE_PROVIDER_SITE_OTHER): Payer: Medicare HMO | Admitting: Neurosurgery

## 2023-07-25 DIAGNOSIS — S12291D Other nondisplaced fracture of third cervical vertebra, subsequent encounter for fracture with routine healing: Secondary | ICD-10-CM

## 2023-07-25 DIAGNOSIS — S12201A Unspecified nondisplaced fracture of third cervical vertebra, initial encounter for closed fracture: Secondary | ICD-10-CM

## 2023-07-25 DIAGNOSIS — R200 Anesthesia of skin: Secondary | ICD-10-CM | POA: Diagnosis not present

## 2023-07-25 NOTE — Progress Notes (Signed)
I had follow-up phone call with the patient today.  She was at home and I was in the office.  She gave consent to go forward with a phone visit.  She said her neck pain has improved significantly.  She is no longer having any neck pain.  She has been doing exercises that she feels like her strength ended.  She is not having any new weakness in her upper extremities.  No new weakness in her lower extremities.  She has had longstanding numbness in her lower extremities and also in her upper extremities and has had multiple vascular procedures.  Notably she does have a history of diabetes, given her history of diabetes and circumferential numbness in the hands would like to have her evaluated with the EMG/nerve conduction study of the bilateral upper extremities.  We have made the referral.  Will continue to follow-up with her after her EMG.  At this point given her stability noted on the x-rays done yesterday we will plan on having her discontinue her cervical collar use.  We will follow-up on the final read.  We spent 11 minutes on the phone for this discussion today.

## 2023-07-26 NOTE — H&P (Signed)
[facility] Admission History & Physical  MRN : [id number]  Deanna [name] is a 72 y.o. (04/23/1951) female who presents with chief complaint of No chief complaint on file. Marland Kitchen  History of Present Illness: Patient presents for angiography of the lower extremities.  Has known history of PAD with monophasic flow distally and nonhealing wounds.  His multiple medical issues as below.  No current facility-administered medications for this encounter.   Current Outpatient Medications  Medication Sig Dispense Refill   aspirin EC 81 MG tablet Take 81 mg by mouth daily.     atorvastatin (LIPITOR) 40 MG tablet Take 40 mg by mouth daily.     clopidogrel (PLAVIX) 75 MG tablet Take 1 tablet (75 mg total) by mouth daily. 30 tablet 11   ferrous sulfate 325 (65 FE) MG EC tablet Take 325 mg by mouth in the morning.     furosemide (LASIX) 20 MG tablet Take 20 mg by mouth daily.     metoprolol tartrate (LOPRESSOR) 25 MG tablet Take 1 tablet (25 mg total) by mouth 2 (two) times daily. 60 tablet 0   sevelamer (RENAGEL) 800 MG tablet Take 1,600 mg by mouth 3 (three) times daily with meals.     gabapentin (NEURONTIN) 100 MG capsule Take 1 capsule (100 mg total) by mouth 2 (two) times daily. 60 capsule 1   oxyCODONE-acetaminophen (PERCOCET) 5-325 MG tablet Take 1 tablet by mouth every 4 (four) hours as needed for severe pain. 20 tablet 0   Vitamin D, Ergocalciferol, (DRISDOL) 1.25 MG (50000 UNIT) CAPS capsule Take 50,000 Units by mouth once a week. 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   required intubation   Anemia    C3 cervical fracture (HCC) 04/2023   CHF (congestive heart failure) (HCC)    combined systolic and diastolic   Coronary artery disease    ESRD on dialysis (HCC)    T-Th-Sat   GERD (gastroesophageal reflux disease)    Hyperlipidemia    Hypertension    Pneumonia    Secondary hyperparathyroidism of renal  origin (HCC)    Stroke (HCC)    with right sided weakness   Type 2 diabetes mellitus with diabetic neuropathy [facility])     Past Surgical History:  Procedure Laterality Date   A/V FISTULAGRAM Right 06/28/2023   Procedure: A/V Fistulagram;  Surgeon: Annice Needy, MD;  Location: [facility] INVASIVE CV LAB;  Service: Cardiovascular;  Laterality: Right;   AV FISTULA PLACEMENT Right 05/17/2023   Procedure: INSERTION OF ARTERIOVENOUS (AV) GORE-TEX GRAFT ARM;  Surgeon: Annice Needy, MD;  Location: [facility] ORS;  Service: Vascular;  Laterality: Right;   CARDIAC CATHETERIZATION Left 03/01/2016   Procedure: Left Heart Cath and Coronary Angiography;  Surgeon: Dalia Heading, MD;  Location: [facility] CV [facility];  Service: Cardiovascular;  Laterality: Left;   CESAREAN SECTION     DIALYSIS/PERMA CATHETER INSERTION Right 01/10/2022   Procedure: DIALYSIS/PERMA CATHETER INSERTION;  Surgeon: Bertram Denver, MD;  Location: [facility] CV [facility];  Service: Cardiovascular;  Laterality: Right;   DIALYSIS/PERMA CATHETER INSERTION N/A 04/12/2022   Procedure: DIALYSIS/PERMA CATHETER INSERTION;  Surgeon: Annice Needy, MD;  Location: [facility];  Service: Cardiovascular;  Laterality: N/A;   INTUBATION-ENDOTRACHEAL WITH TRACHEOSTOMY STANDBY Left 01/14/2022   Procedure: INTUBATION-NASAL WITH TRACHEOSTOMY STANDBY;  Surgeon: Linus Salmons, MD;  Location: [facility] ORS;  Service: ENT;  Laterality: Left;   LOWER EXTREMITY ANGIOGRAPHY  [facility] Admission History & Physical  MRN : [id number]  Deanna [name] is a 72 y.o. (04/23/1951) female who presents with chief complaint of No chief complaint on file. Marland Kitchen  History of Present Illness: Patient presents for angiography of the lower extremities.  Has known history of PAD with monophasic flow distally and nonhealing wounds.  His multiple medical issues as below.  No current facility-administered medications for this encounter.   Current Outpatient Medications  Medication Sig Dispense Refill   aspirin EC 81 MG tablet Take 81 mg by mouth daily.     atorvastatin (LIPITOR) 40 MG tablet Take 40 mg by mouth daily.     clopidogrel (PLAVIX) 75 MG tablet Take 1 tablet (75 mg total) by mouth daily. 30 tablet 11   ferrous sulfate 325 (65 FE) MG EC tablet Take 325 mg by mouth in the morning.     furosemide (LASIX) 20 MG tablet Take 20 mg by mouth daily.     metoprolol tartrate (LOPRESSOR) 25 MG tablet Take 1 tablet (25 mg total) by mouth 2 (two) times daily. 60 tablet 0   sevelamer (RENAGEL) 800 MG tablet Take 1,600 mg by mouth 3 (three) times daily with meals.     gabapentin (NEURONTIN) 100 MG capsule Take 1 capsule (100 mg total) by mouth 2 (two) times daily. 60 capsule 1   oxyCODONE-acetaminophen (PERCOCET) 5-325 MG tablet Take 1 tablet by mouth every 4 (four) hours as needed for severe pain. 20 tablet 0   Vitamin D, Ergocalciferol, (DRISDOL) 1.25 MG (50000 UNIT) CAPS capsule Take 50,000 Units by mouth once a week. 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   required intubation   Anemia    C3 cervical fracture (HCC) 04/2023   CHF (congestive heart failure) (HCC)    combined systolic and diastolic   Coronary artery disease    ESRD on dialysis (HCC)    T-Th-Sat   GERD (gastroesophageal reflux disease)    Hyperlipidemia    Hypertension    Pneumonia    Secondary hyperparathyroidism of renal  origin (HCC)    Stroke (HCC)    with right sided weakness   Type 2 diabetes mellitus with diabetic neuropathy [facility])     Past Surgical History:  Procedure Laterality Date   A/V FISTULAGRAM Right 06/28/2023   Procedure: A/V Fistulagram;  Surgeon: Annice Needy, MD;  Location: [facility];  Service: Cardiovascular;  Laterality: Right;   AV FISTULA PLACEMENT Right 05/17/2023   Procedure: INSERTION OF ARTERIOVENOUS (AV) GORE-TEX GRAFT ARM;  Surgeon: Annice Needy, MD;  Location: [facility] ORS;  Service: Vascular;  Laterality: Right;   CARDIAC CATHETERIZATION Left 03/01/2016   Procedure: Left Heart Cath and Coronary Angiography;  Surgeon: Dalia Heading, MD;  Location: [facility];  Service: Cardiovascular;  Laterality: Left;   CESAREAN SECTION     DIALYSIS/PERMA CATHETER INSERTION Right 01/10/2022   Procedure: DIALYSIS/PERMA CATHETER INSERTION;  Surgeon: Bertram Denver, MD;  Location: [facility];  Service: Cardiovascular;  Laterality: Right;   DIALYSIS/PERMA CATHETER INSERTION N/A 04/12/2022   Procedure: DIALYSIS/PERMA CATHETER INSERTION;  Surgeon: Annice Needy, MD;  Location: [facility];  Service: Cardiovascular;  Laterality: N/A;   INTUBATION-ENDOTRACHEAL WITH TRACHEOSTOMY STANDBY Left 01/14/2022   Procedure: INTUBATION-NASAL WITH TRACHEOSTOMY STANDBY;  Surgeon: Linus Salmons, MD;  Location: [facility] ORS;  Service: ENT;  Laterality: Left;   LOWER EXTREMITY ANGIOGRAPHY  Musculoskeletal: M/S 5/5 throughout.  Extremities without ischemic changes.  No deformity or atrophy.  Neurologic: Sensation grossly intact in extremities.  Symmetrical.  Speech is fluent. Motor exam as listed above. Psychiatric: Judgment intact, Mood & affect appropriate for pt's clinical situation. Dermatologic: wounds on the foot currently dressed     CBC Lab Results  Component Value Date   WBC 9.4 05/19/2023   HGB 9.7 (L) 05/19/2023   HCT 30.1 (L) 05/19/2023   MCV 84.6 05/19/2023   PLT 210 05/19/2023    BMET    Component Value Date/Time   NA 137 05/19/2023 0414   K 3.6 05/19/2023 0414   CL 100 05/19/2023 0414   CO2 29 05/19/2023 0414   GLUCOSE 89 05/19/2023 0414   BUN 24 (H) 05/19/2023 0414   CREATININE 4.61 (H) 05/19/2023 0414   CALCIUM 8.5 (L) 05/19/2023 0414   GFRNONAA 10 (L) 05/19/2023 0414   GFRAA 36 (L) 12/09/2017 1108   CrCl cannot be calculated (Patient's most recent lab result is older than the maximum 21 days allowed.).  COAG Lab Results  Component Value Date   INR 1.1 04/28/2023   INR 1.1 01/20/2022   INR 1.0 01/07/2022    Radiology PERIPHERAL VASCULAR CATHETERIZATION  Result Date: 07/23/2023 See surgical note for  result.  VAS US DUPLEX DIALYSIS ACCESS (AVF, AVG)  Result Date: 07/19/2023 DIALYSIS ACCESS Patient Name:  Deanna [name]  Date of Exam:   07/18/2023 Medical Rec #: [id number]         Accession #:    [id number] Date of Birth: 01/07/1951         Patient Gender: F Patient Age:   57 years Exam Location:  [facility] Procedure:      VAS US DUPLEX DIALYSIS ACCESS (AVF, AVG) Referring Phys: Festus Barren --------------------------------------------------------------------------------  Access Site: Right Upper Extremity. Access Type: Forearm loop AVG. History: 05/17/2023: Right Forearm AVG Loop Graft inserted. Patient reports          periodic, spontaneous numbness and pain in her right thumb.           06/28/2023: Right Brachial Artery to Brachial Vein Forearm AVG          cannulation under US guidance. Rt Arm Shuntogram. PTA of the Arterial          Anastomosis with 5 mm diameter Lutonix drug coated angioplasty balloon.          PTA of the Venous Anastomosis with 5 mm diameter Lutonix drug coated          angioplasty balloon. Stent placement to the Venous Anastomosis with 6          mm diameter by 5 cm Viabahn stent. Comparison Study: 06/20/2023 Performing Technologist: Debbe Bales RVS  Examination Guidelines: A complete evaluation includes B-mode imaging, spectral Doppler, color Doppler, and power Doppler as needed of all accessible portions of each vessel. Unilateral testing is considered an integral part of a complete examination. Limited examinations for reoccurring indications may be performed as noted.  Findings:   +--------------------+----------+-----------------+--------+ AVG                 PSV (cm/s)Flow Vol (mL/min)Describe +--------------------+----------+-----------------+--------+ Native artery inflow   163          1125                +--------------------+----------+-----------------+--------+ Arterial anastomosis   389                               +--------------------+----------+-----------------+--------+  Musculoskeletal: M/S 5/5 throughout.  Extremities without ischemic changes.  No deformity or atrophy.  Neurologic: Sensation grossly intact in extremities.  Symmetrical.  Speech is fluent. Motor exam as listed above. Psychiatric: Judgment intact, Mood & affect appropriate for pt's clinical situation. Dermatologic: wounds on the foot currently dressed     CBC Lab Results  Component Value Date   WBC 9.4 05/19/2023   HGB 9.7 (L) 05/19/2023   HCT 30.1 (L) 05/19/2023   MCV 84.6 05/19/2023   PLT 210 05/19/2023    BMET    Component Value Date/Time   NA 137 05/19/2023 0414   K 3.6 05/19/2023 0414   CL 100 05/19/2023 0414   CO2 29 05/19/2023 0414   GLUCOSE 89 05/19/2023 0414   BUN 24 (H) 05/19/2023 0414   CREATININE 4.61 (H) 05/19/2023 0414   CALCIUM 8.5 (L) 05/19/2023 0414   GFRNONAA 10 (L) 05/19/2023 0414   GFRAA 36 (L) 12/09/2017 1108   CrCl cannot be calculated (Patient's most recent lab result is older than the maximum 21 days allowed.).  COAG Lab Results  Component Value Date   INR 1.1 04/28/2023   INR 1.1 01/20/2022   INR 1.0 01/07/2022    Radiology PERIPHERAL VASCULAR CATHETERIZATION  Result Date: 07/23/2023 See surgical note for  result.  VAS US DUPLEX DIALYSIS ACCESS (AVF, AVG)  Result Date: 07/19/2023 DIALYSIS ACCESS Patient Name:  Deanna [name]  Date of Exam:   07/18/2023 Medical Rec #: [id number]         Accession #:    [id number] Date of Birth: 01/07/1951         Patient Gender: F Patient Age:   57 years Exam Location:  [facility] Procedure:      VAS US DUPLEX DIALYSIS ACCESS (AVF, AVG) Referring Phys: Festus Barren --------------------------------------------------------------------------------  Access Site: Right Upper Extremity. Access Type: Forearm loop AVG. History: 05/17/2023: Right Forearm AVG Loop Graft inserted. Patient reports          periodic, spontaneous numbness and pain in her right thumb.           06/28/2023: Right Brachial Artery to Brachial Vein Forearm AVG          cannulation under US guidance. Rt Arm Shuntogram. PTA of the Arterial          Anastomosis with 5 mm diameter Lutonix drug coated angioplasty balloon.          PTA of the Venous Anastomosis with 5 mm diameter Lutonix drug coated          angioplasty balloon. Stent placement to the Venous Anastomosis with 6          mm diameter by 5 cm Viabahn stent. Comparison Study: 06/20/2023 Performing Technologist: Debbe Bales RVS  Examination Guidelines: A complete evaluation includes B-mode imaging, spectral Doppler, color Doppler, and power Doppler as needed of all accessible portions of each vessel. Unilateral testing is considered an integral part of a complete examination. Limited examinations for reoccurring indications may be performed as noted.  Findings:   +--------------------+----------+-----------------+--------+ AVG                 PSV (cm/s)Flow Vol (mL/min)Describe +--------------------+----------+-----------------+--------+ Native artery inflow   163          1125                +--------------------+----------+-----------------+--------+ Arterial anastomosis   389                               +--------------------+----------+-----------------+--------+  [facility] Admission History & Physical  MRN : [id number]  Deanna [name] is a 72 y.o. (04/23/1951) female who presents with chief complaint of No chief complaint on file. Marland Kitchen  History of Present Illness: Patient presents for angiography of the lower extremities.  Has known history of PAD with monophasic flow distally and nonhealing wounds.  His multiple medical issues as below.  No current facility-administered medications for this encounter.   Current Outpatient Medications  Medication Sig Dispense Refill   aspirin EC 81 MG tablet Take 81 mg by mouth daily.     atorvastatin (LIPITOR) 40 MG tablet Take 40 mg by mouth daily.     clopidogrel (PLAVIX) 75 MG tablet Take 1 tablet (75 mg total) by mouth daily. 30 tablet 11   ferrous sulfate 325 (65 FE) MG EC tablet Take 325 mg by mouth in the morning.     furosemide (LASIX) 20 MG tablet Take 20 mg by mouth daily.     metoprolol tartrate (LOPRESSOR) 25 MG tablet Take 1 tablet (25 mg total) by mouth 2 (two) times daily. 60 tablet 0   sevelamer (RENAGEL) 800 MG tablet Take 1,600 mg by mouth 3 (three) times daily with meals.     gabapentin (NEURONTIN) 100 MG capsule Take 1 capsule (100 mg total) by mouth 2 (two) times daily. 60 capsule 1   oxyCODONE-acetaminophen (PERCOCET) 5-325 MG tablet Take 1 tablet by mouth every 4 (four) hours as needed for severe pain. 20 tablet 0   Vitamin D, Ergocalciferol, (DRISDOL) 1.25 MG (50000 UNIT) CAPS capsule Take 50,000 Units by mouth once a week. 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   required intubation   Anemia    C3 cervical fracture (HCC) 04/2023   CHF (congestive heart failure) (HCC)    combined systolic and diastolic   Coronary artery disease    ESRD on dialysis (HCC)    T-Th-Sat   GERD (gastroesophageal reflux disease)    Hyperlipidemia    Hypertension    Pneumonia    Secondary hyperparathyroidism of renal  origin (HCC)    Stroke (HCC)    with right sided weakness   Type 2 diabetes mellitus with diabetic neuropathy [facility])     Past Surgical History:  Procedure Laterality Date   A/V FISTULAGRAM Right 06/28/2023   Procedure: A/V Fistulagram;  Surgeon: Annice Needy, MD;  Location: [facility];  Service: Cardiovascular;  Laterality: Right;   AV FISTULA PLACEMENT Right 05/17/2023   Procedure: INSERTION OF ARTERIOVENOUS (AV) GORE-TEX GRAFT ARM;  Surgeon: Annice Needy, MD;  Location: [facility] OR[facility];  Service: Vascular;  Laterality: Right;   CARDIAC CATHETERIZATION Left 03/01/2016   Procedure: Left Heart Cath and Coronary Angiography;  Surgeon: Dalia Heading, MD;  Location: [facility];  Service: Cardiovascular;  Laterality: Left;   CESAREAN SECTION     DIALYSIS/PERMA CATHETER INSERTION Right 01/10/2022   Procedure: DIALYSIS/PERMA CATHETER INSERTION;  Surgeon: Bertram Denver, MD;  Location: [facility];  Service: Cardiovascular;  Laterality: Right;   DIALYSIS/PERMA CATHETER INSERTION N/A 04/12/2022   Procedure: DIALYSIS/PERMA CATHETER INSERTION;  Surgeon: Annice Needy, MD;  Location: [facility];  Service: Cardiovascular;  Laterality: N/A;   INTUBATION-ENDOTRACHEAL WITH TRACHEOSTOMY STANDBY Left 01/14/2022   Procedure: INTUBATION-NASAL WITH TRACHEOSTOMY STANDBY;  Surgeon: Linus Salmons, MD;  Location: [facility] OR[facility];  Service: ENT;  Laterality: Left;   LOWER EXTREMITY ANGIOGRAPHY

## 2023-07-26 NOTE — Interval H&P Note (Signed)
History and Physical Interval Note:  07/26/2023 2:34 PM  Deanna [name]  has presented today for surgery, with the diagnosis of RLE Angio   ASO w claudication.  The various methods of treatment have been discussed with the patient and family. After consideration of risks, benefits and other options for treatment, the patient has consented to  Procedure(s): Lower Extremity Angiography (Right) as a surgical intervention.  The patient's history has been reviewed, patient examined, no change in status, stable for surgery.  I have reviewed the patient's chart and labs.  Questions were answered to the patient's satisfaction.     Festus Barren

## 2023-08-02 ENCOUNTER — Emergency Department: Payer: Medicare HMO

## 2023-08-02 ENCOUNTER — Other Ambulatory Visit: Payer: Self-pay

## 2023-08-02 ENCOUNTER — Emergency Department
Admission: EM | Admit: 2023-08-02 | Discharge: 2023-08-02 | Disposition: A | Discharge status: Home / Self Care | Payer: Medicare HMO | Source: Home / Self Care | Attending: Emergency Medicine | Admitting: Emergency Medicine

## 2023-08-02 DIAGNOSIS — M5412 Radiculopathy, cervical region: Secondary | ICD-10-CM | POA: Insufficient documentation

## 2023-08-02 DIAGNOSIS — Z992 Dependence on renal dialysis: Secondary | ICD-10-CM | POA: Insufficient documentation

## 2023-08-02 DIAGNOSIS — I6340 Cerebral infarction due to embolism of unspecified cerebral artery: Secondary | ICD-10-CM | POA: Diagnosis not present

## 2023-08-02 DIAGNOSIS — I639 Cerebral infarction, unspecified: Secondary | ICD-10-CM | POA: Diagnosis not present

## 2023-08-02 MED ORDER — METHYLPREDNISOLONE 4 MG PO TBPK: ORAL_TABLET | ORAL | 0 refills | Status: DC

## 2023-08-02 MED ORDER — OXYCODONE-ACETAMINOPHEN 5-325 MG PO TABS: 1.0000 | ORAL_TABLET | ORAL | 0 refills | Status: DC | PRN

## 2023-08-02 MED ORDER — OXYCODONE-ACETAMINOPHEN 5-325 MG PO TABS
1.0000 | ORAL_TABLET | Freq: Once | ORAL | Status: AC
  Administered 2023-08-02: 1 via ORAL
  Filled 2023-08-02: qty 1

## 2023-08-02 NOTE — ED Triage Notes (Signed)
Pt sent from [facility] d/t left arm pain x1 week. Pt was able to finish dialysis today in her right arm and pt does have hx of fractured neck and pinched nerve.

## 2023-08-02 NOTE — ED Notes (Signed)
See triage note  Presents with left arm pain and numbness  States she had a fx to her neck and was just taken out of her neck brace about 1 week ago  But developed pain to left arm pain about 1 week ago   States she a stent placed by Dr Wyn Quaker and developed pain since  Also having diff with moving her arm  Good pulses

## 2023-08-02 NOTE — ED Provider Notes (Signed)
[facility] Provider Note    Event Date/Time   First MD Initiated Contact with Patient 08/02/23 1136     (approximate)   History   Arm Pain   HPI  Deanna [name] is a 72 y.o. female dialysis patient, presents emergency department complaint of neck pain.  Patient has a history of a closed nondisplaced fracture of C3.  Patient states that they took the brace off about a week or so ago.  Started having more pain into the left arm.  States numbness and tingling.  No chest pain or shortness of breath.  Patient states this has been ongoing for several weeks.  Feels it is strictly from her neck.  Was sent here by [facility].  Was able to finish dialysis today.      Physical Exam   Triage Vital Signs: ED Triage Vitals  Encounter Vitals Group     BP 08/02/23 1121 (!) 121/56     Systolic BP Percentile --      Diastolic BP Percentile --      Pulse Rate 08/02/23 1121 84     Resp 08/02/23 1121 16     Temp 08/02/23 1121 98 F (36.7 C)     Temp Source 08/02/23 1121 Oral     SpO2 08/02/23 1121 98 %     Weight 08/02/23 1120 159 lb (72.1 kg)     Height 08/02/23 1120 6\' 1"  (1.854 m)     Head Circumference --      Peak Flow --      Pain Score 08/02/23 1119 7     Pain Loc --      Pain Education --      Exclude from Growth Chart --     Most recent vital signs: Vitals:   08/02/23 1525 08/02/23 1551  BP: (!) 108/50 (!) 110/52  Pulse: 80 78  Resp: 16 16  Temp: 98 F (36.7 C) 98 F (36.7 C)  SpO2:  98%     General: Awake, no distress.   CV:  Good peripheral perfusion. regular rate and  rhythm Resp:  Normal effort. Lungs cta Abd:  No distention.   Other:  C-spine tender to palpation, left trapezius tender to palpation, patient has weakness in the left hand, there is a permanent deficit from her stroke   ED Results / Procedures / Treatments   Labs (all labs ordered are listed, but only abnormal results are displayed) Labs Reviewed - No data to  display   EKG     RADIOLOGY CT of cervical spine    PROCEDURES:   Procedures   MEDICATIONS ORDERED IN ED: Medications  oxyCODONE-acetaminophen (PERCOCET/ROXICET) 5-325 MG per tablet 1 tablet (1 tablet Oral Given 08/02/23 1226)     IMPRESSION / MDM / ASSESSMENT AND PLAN / ED COURSE  I reviewed the triage vital signs and the nursing notes.                              Differential diagnosis includes, but is not limited to, fracture, nerve compression, muscle spasm  Patient's presentation is most consistent with acute illness / injury with system symptoms.   CT cervical spine Patient given Percocet p.o.  For consenter MI, cardiac pathology about patient's had this ongoing for several weeks and does have chronic problem of fracture.  Just worsening exasperation of chronic problem.  Therefore do not feel that the EKG/lab work is warranted at  this time.  Patient states her labs were normal after they finished dialysis today.  CT of cervical spine, I did independently review and interpret the radiologist reading as being negative for any acute abnormality.  Does have chronic degenerative changes.  No fracture noted  I did explain all these findings to the patient.  She already has another appointment with a neurosurgeon in [place].  Will place her on a steroid pack, give her Percocet for pain.  Follow-up with her regular doctor.  Return if worsening.  Caregiver is in agreement with the treatment plan.  Patient is in agreement treatment plan.  She is discharged stable condition.     FINAL CLINICAL IMPRESSION(S) / ED DIAGNOSES   Final diagnoses:  Cervical radiculopathy     Rx / DC Orders   ED Discharge Orders          Ordered    methylPREDNISolone (MEDROL DOSEPAK) 4 MG TBPK tablet        08/02/23 1533    oxyCODONE-acetaminophen (PERCOCET) 5-325 MG tablet  Every 4 hours PRN        08/02/23 1533             Note:  This document was prepared using Dragon  voice recognition software and may include unintentional dictation errors.    Faythe Ghee, PA-C 08/02/23 1849    Merwyn Katos, MD 08/03/23 [phone]

## 2023-08-03 ENCOUNTER — Other Ambulatory Visit: Payer: Self-pay

## 2023-08-03 ENCOUNTER — Encounter: Payer: Self-pay | Admitting: Neurology

## 2023-08-03 ENCOUNTER — Telehealth: Payer: Self-pay | Admitting: Neurosurgery

## 2023-08-03 DIAGNOSIS — R202 Paresthesia of skin: Secondary | ICD-10-CM

## 2023-08-03 NOTE — Telephone Encounter (Signed)
I spoke with Deanna [name].  She has sharp pains coming from her neck down her left arm. She cannot use her left arm at all. She confirms that these are not new symptoms and that nothing new is going on since her call with Dr Katrinka Blazing on 07/25/23.  She has not picked up the prescriptions yet, but plans to do so today.  She reports that the ER told her to put the neck brace back on.  She feels like wearing the neck brace makes a difference.   Her EMG is scheduled for 09/03/23. I have made her a follow up with Dr Katrinka Blazing for 09/05/23 with Dr Katrinka Blazing. She is on the cancellation list for the EMG and has been advised to contact our office to move up her appointment with Dr Katrinka Blazing if her EMG is moved to a sooner date.

## 2023-08-03 NOTE — Telephone Encounter (Signed)
Patient called to let our office know that she went to the hospital yesterday because her left arm went completely numb. She states that they did a CT scan and told her to start wearing her brace again. Please advise.

## 2023-08-04 ENCOUNTER — Inpatient Hospital Stay
Admission: EM | Admit: 2023-08-04 | Discharge: 2023-08-09 | DRG: 040 | Disposition: A | Discharge status: Skilled Nursing Facility | Payer: Medicare HMO | Source: Other Acute Inpatient Hospital | Attending: Internal Medicine | Admitting: Internal Medicine

## 2023-08-04 ENCOUNTER — Emergency Department: Payer: Medicare HMO

## 2023-08-04 ENCOUNTER — Other Ambulatory Visit: Payer: Self-pay

## 2023-08-04 ENCOUNTER — Observation Stay: Payer: Medicare HMO

## 2023-08-04 DIAGNOSIS — E785 Hyperlipidemia, unspecified: Secondary | ICD-10-CM | POA: Diagnosis present

## 2023-08-04 DIAGNOSIS — G8194 Hemiplegia, unspecified affecting left nondominant side: Secondary | ICD-10-CM | POA: Diagnosis present

## 2023-08-04 DIAGNOSIS — E1151 Type 2 diabetes mellitus with diabetic peripheral angiopathy without gangrene: Secondary | ICD-10-CM | POA: Diagnosis present

## 2023-08-04 DIAGNOSIS — Z91018 Allergy to other foods: Secondary | ICD-10-CM

## 2023-08-04 DIAGNOSIS — K219 Gastro-esophageal reflux disease without esophagitis: Secondary | ICD-10-CM | POA: Diagnosis present

## 2023-08-04 DIAGNOSIS — I739 Peripheral vascular disease, unspecified: Secondary | ICD-10-CM

## 2023-08-04 DIAGNOSIS — N186 End stage renal disease: Secondary | ICD-10-CM

## 2023-08-04 DIAGNOSIS — S12201A Unspecified nondisplaced fracture of third cervical vertebra, initial encounter for closed fracture: Secondary | ICD-10-CM | POA: Diagnosis present

## 2023-08-04 DIAGNOSIS — Z992 Dependence on renal dialysis: Secondary | ICD-10-CM

## 2023-08-04 DIAGNOSIS — N2581 Secondary hyperparathyroidism of renal origin: Secondary | ICD-10-CM | POA: Diagnosis present

## 2023-08-04 DIAGNOSIS — G8929 Other chronic pain: Secondary | ICD-10-CM | POA: Diagnosis present

## 2023-08-04 DIAGNOSIS — D631 Anemia in chronic kidney disease: Secondary | ICD-10-CM | POA: Diagnosis present

## 2023-08-04 DIAGNOSIS — R29704 NIHSS score 4: Secondary | ICD-10-CM | POA: Diagnosis present

## 2023-08-04 DIAGNOSIS — Z8673 Personal history of transient ischemic attack (TIA), and cerebral infarction without residual deficits: Secondary | ICD-10-CM

## 2023-08-04 DIAGNOSIS — I5020 Unspecified systolic (congestive) heart failure: Secondary | ICD-10-CM | POA: Diagnosis present

## 2023-08-04 DIAGNOSIS — Z888 Allergy status to other drugs, medicaments and biological substances status: Secondary | ICD-10-CM

## 2023-08-04 DIAGNOSIS — E1140 Type 2 diabetes mellitus with diabetic neuropathy, unspecified: Secondary | ICD-10-CM | POA: Diagnosis present

## 2023-08-04 DIAGNOSIS — Z7902 Long term (current) use of antithrombotics/antiplatelets: Secondary | ICD-10-CM

## 2023-08-04 DIAGNOSIS — I2510 Atherosclerotic heart disease of native coronary artery without angina pectoris: Secondary | ICD-10-CM | POA: Diagnosis present

## 2023-08-04 DIAGNOSIS — E1122 Type 2 diabetes mellitus with diabetic chronic kidney disease: Secondary | ICD-10-CM | POA: Diagnosis present

## 2023-08-04 DIAGNOSIS — E119 Type 2 diabetes mellitus without complications: Secondary | ICD-10-CM | POA: Diagnosis not present

## 2023-08-04 DIAGNOSIS — Z7982 Long term (current) use of aspirin: Secondary | ICD-10-CM

## 2023-08-04 DIAGNOSIS — Z880 Allergy status to penicillin: Secondary | ICD-10-CM

## 2023-08-04 DIAGNOSIS — I639 Cerebral infarction, unspecified: Secondary | ICD-10-CM | POA: Diagnosis not present

## 2023-08-04 DIAGNOSIS — M5412 Radiculopathy, cervical region: Secondary | ICD-10-CM | POA: Diagnosis present

## 2023-08-04 DIAGNOSIS — I132 Hypertensive heart and chronic kidney disease with heart failure and with stage 5 chronic kidney disease, or end stage renal disease: Secondary | ICD-10-CM | POA: Diagnosis present

## 2023-08-04 DIAGNOSIS — S12201S Unspecified nondisplaced fracture of third cervical vertebra, sequela: Secondary | ICD-10-CM

## 2023-08-04 DIAGNOSIS — I6340 Cerebral infarction due to embolism of unspecified cerebral artery: Principal | ICD-10-CM | POA: Diagnosis present

## 2023-08-04 DIAGNOSIS — M4810 Ankylosing hyperostosis [Forestier], site unspecified: Secondary | ICD-10-CM | POA: Diagnosis present

## 2023-08-04 DIAGNOSIS — Z79899 Other long term (current) drug therapy: Secondary | ICD-10-CM

## 2023-08-04 DIAGNOSIS — Z955 Presence of coronary angioplasty implant and graft: Secondary | ICD-10-CM

## 2023-08-04 DIAGNOSIS — Z8701 Personal history of pneumonia (recurrent): Secondary | ICD-10-CM

## 2023-08-04 DIAGNOSIS — I5042 Chronic combined systolic (congestive) and diastolic (congestive) heart failure: Secondary | ICD-10-CM | POA: Diagnosis present

## 2023-08-04 DIAGNOSIS — M8448XD Pathological fracture, other site, subsequent encounter for fracture with routine healing: Secondary | ICD-10-CM | POA: Diagnosis present

## 2023-08-04 LAB — BASIC METABOLIC PANEL
Anion gap: 12 (ref 5–15)
BUN: 17 mg/dL (ref 8–23)
CO2: 29 mmol/L (ref 22–32)
Calcium: 9 mg/dL (ref 8.9–10.3)
Chloride: 95 mmol/L — ABNORMAL LOW (ref 98–111)
Creatinine, Ser: 3.19 mg/dL — ABNORMAL HIGH (ref 0.44–1.00)
GFR, Estimated: 15 mL/min — ABNORMAL LOW (ref >60)
Glucose, Bld: 145 mg/dL — ABNORMAL HIGH (ref 70–99)
Potassium: 3.7 mmol/L (ref 3.5–5.1)
Sodium: 136 mmol/L (ref 135–145)

## 2023-08-04 LAB — CBC
HCT: 35.3 % — ABNORMAL LOW (ref 36.0–46.0)
Hemoglobin: 11.3 g/dL — ABNORMAL LOW (ref 12.0–15.0)
MCH: 27.5 pg (ref 26.0–34.0)
MCHC: 32 g/dL (ref 30.0–36.0)
MCV: 85.9 fL (ref 80.0–100.0)
Platelets: 283 10*3/uL (ref 150–400)
RBC: 4.11 MIL/uL (ref 3.87–5.11)
RDW: 14.8 % (ref 11.5–15.5)
WBC: 10.1 10*3/uL (ref 4.0–10.5)
nRBC: 0 % (ref 0.0–0.2)

## 2023-08-04 LAB — GLUCOSE, CAPILLARY
Glucose-Capillary: 78 mg/dL (ref 70–99)
Glucose-Capillary: 139 mg/dL — ABNORMAL HIGH (ref 70–99)

## 2023-08-04 MED ORDER — ATORVASTATIN CALCIUM 20 MG PO TABS
80.0000 mg | ORAL_TABLET | Freq: Every day | ORAL | Status: DC
  Administered 2023-08-05: 80 mg via ORAL
  Filled 2023-08-04: qty 4

## 2023-08-04 MED ORDER — INSULIN ASPART 100 UNIT/ML IJ SOLN: 0.0000 [IU] | Freq: Three times a day (TID) | INTRAMUSCULAR | Status: DC

## 2023-08-04 MED ORDER — ACETAMINOPHEN 650 MG RE SUPP: 650.0000 mg | RECTAL | Status: DC | PRN

## 2023-08-04 MED ORDER — ACETAMINOPHEN 160 MG/5ML PO SOLN: 650.0000 mg | ORAL | Status: DC | PRN

## 2023-08-04 MED ORDER — STROKE: EARLY STAGES OF RECOVERY BOOK
Freq: Once | Status: AC
Start: 2023-08-05 — End: 2023-08-05

## 2023-08-04 MED ORDER — FERROUS SULFATE 325 (65 FE) MG PO TABS
325.0000 mg | ORAL_TABLET | Freq: Every morning | ORAL | Status: DC
Start: 2023-08-05 — End: 2023-08-06
  Administered 2023-08-05: 325 mg via ORAL
  Filled 2023-08-04 (×2): qty 1

## 2023-08-04 MED ORDER — IOHEXOL 350 MG/ML SOLN
75.0000 mL | Freq: Once | INTRAVENOUS | Status: AC | PRN
  Administered 2023-08-05: 75 mL via INTRAVENOUS

## 2023-08-04 MED ORDER — ASPIRIN 81 MG PO TBEC
81.0000 mg | DELAYED_RELEASE_TABLET | Freq: Every day | ORAL | Status: DC
  Administered 2023-08-05 – 2023-08-09 (×4): 81 mg via ORAL
  Filled 2023-08-04 (×4): qty 1

## 2023-08-04 MED ORDER — ONDANSETRON HCL 4 MG/2ML IJ SOLN
4.0000 mg | Freq: Four times a day (QID) | INTRAMUSCULAR | Status: DC | PRN
  Administered 2023-08-04: 4 mg via INTRAVENOUS
  Filled 2023-08-04: qty 2

## 2023-08-04 MED ORDER — SENNOSIDES-DOCUSATE SODIUM 8.6-50 MG PO TABS: 1.0000 | ORAL_TABLET | Freq: Every evening | ORAL | Status: DC | PRN

## 2023-08-04 MED ORDER — OXYCODONE-ACETAMINOPHEN 5-325 MG PO TABS
1.0000 | ORAL_TABLET | Freq: Once | ORAL | Status: AC
  Administered 2023-08-04: 1 via ORAL
  Filled 2023-08-04: qty 1

## 2023-08-04 MED ORDER — ACETAMINOPHEN 325 MG PO TABS
650.0000 mg | ORAL_TABLET | ORAL | Status: DC | PRN
Start: 2023-08-04 — End: 2023-08-06
  Filled 2023-08-04: qty 2

## 2023-08-04 MED ORDER — SEVELAMER CARBONATE 800 MG PO TABS
800.0000 mg | ORAL_TABLET | Freq: Three times a day (TID) | ORAL | Status: DC
Start: 2023-08-04 — End: 2023-08-09
  Administered 2023-08-05 – 2023-08-09 (×11): 800 mg via ORAL
  Filled 2023-08-04 (×12): qty 1

## 2023-08-04 MED ORDER — ENOXAPARIN SODIUM 30 MG/0.3ML IJ SOSY
30.0000 mg | PREFILLED_SYRINGE | INTRAMUSCULAR | Status: DC
  Administered 2023-08-04 – 2023-08-05 (×2): 30 mg via SUBCUTANEOUS
  Filled 2023-08-04 (×2): qty 0.3

## 2023-08-04 MED ORDER — CLOPIDOGREL BISULFATE 75 MG PO TABS
75.0000 mg | ORAL_TABLET | Freq: Every day | ORAL | Status: DC
  Administered 2023-08-05 – 2023-08-09 (×4): 75 mg via ORAL
  Filled 2023-08-04 (×4): qty 1

## 2023-08-04 MED ORDER — MIDODRINE HCL 5 MG PO TABS
2.5000 mg | ORAL_TABLET | Freq: Three times a day (TID) | ORAL | Status: DC
  Administered 2023-08-04 – 2023-08-09 (×11): 2.5 mg via ORAL
  Filled 2023-08-04 (×12): qty 1

## 2023-08-04 NOTE — Assessment & Plan Note (Signed)
-   Continue home regimen 

## 2023-08-04 NOTE — Progress Notes (Signed)
Pt refusing to answer admission profile questions at this time

## 2023-08-04 NOTE — ED Notes (Signed)
Sandwich tray and diet gingerale given per pt request.

## 2023-08-04 NOTE — ED Triage Notes (Signed)
Pt in via EMS from dialysis. Pt reports some NV and feeling dizzy this am. Pt was an hour short on her dialysis. Pt had 1.3 liters taken today. Pt reports left arm is numb and she cannot move it but has been seen for it and it is not new. Pt does have a rash on left arm. Pt with #22g to left hand. EMS reports rash happened after zofran was given but pt denies allergy to zofran. HR 89, 130/69

## 2023-08-04 NOTE — Assessment & Plan Note (Signed)
Patient is presenting with an acute infarct of the right thalamus, presenting as left arm + leg weakness/numbness (arm > leg). I suspect worsening of symptoms during dialysis was due to low blood pressure leading to a recrudescent event, however initial infarct occurred when symptoms started 1 week ago.   - Neurology consulted; appreciate their recommendations - CTA head/neck pending - Telemetry monitoring - Start midodrine 2.5 mg 3 times daily - Echocardiogram - A1C and Lipid panel  - Continue home DAPT - Increase home atorvastatin to 80 mg daily - PT/OT/SLP

## 2023-08-04 NOTE — Progress Notes (Signed)
Patient refused full nursing assessment.  Patient verbalizes frustration with multiple questions despite education regarding purpose of question to determine extent of current CVA.

## 2023-08-04 NOTE — Progress Notes (Signed)
PHARMACIST - PHYSICIAN COMMUNICATION  CONCERNING:  Enoxaparin (Lovenox) for DVT Prophylaxis    RECOMMENDATION: Patient was prescribed enoxaprin 40mg  q24 hours for VTE prophylaxis.   Filed Weights   08/04/23 1001  Weight: 73 kg (160 lb 15 oz)    Body mass index is 21.23 kg/m.  Estimated Creatinine Clearance: 18.6 mL/min (A) (by C-G formula based on SCr of 3.19 mg/dL (H)).    Patient is candidate for enoxaparin 30mg  every 24 hours based on CrCl <18ml/min or Weight <45kg  DESCRIPTION: Pharmacy has adjusted enoxaparin dose per [facility] policy.  Patient is now receiving enoxaparin 30 mg every 24 hours    Elliot Gurney, PharmD, BCPS Clinical Pharmacist  08/04/2023 4:07 PM

## 2023-08-04 NOTE — Assessment & Plan Note (Signed)
-   Nephrology consulted; appreciate their recommendations - Dialysis per nephrology

## 2023-08-04 NOTE — Plan of Care (Signed)
  Problem: Education: Goal: Ability to describe self-care measures that may prevent or decrease complications (Diabetes Survival Skills Education) will improve Outcome: Progressing   Problem: Coping: Goal: Ability to adjust to condition or change in health will improve Outcome: Progressing   Problem: Fluid Volume: Goal: Ability to maintain a balanced intake and output will improve Outcome: Progressing   Problem: Metabolic: Goal: Ability to maintain appropriate glucose levels will improve Outcome: Progressing   Problem: Skin Integrity: Goal: Risk for impaired skin integrity will decrease Outcome: Progressing   Problem: Tissue Perfusion: Goal: Adequacy of tissue perfusion will improve Outcome: Progressing   Problem: Education: Goal: Knowledge of disease or condition will improve Outcome: Progressing   Problem: Ischemic Stroke/TIA Tissue Perfusion: Goal: Complications of ischemic stroke/TIA will be minimized Outcome: Progressing   Problem: Coping: Goal: Will verbalize positive feelings about self Outcome: Progressing   Problem: Health Behavior/Discharge Planning: Goal: Ability to manage health-related needs will improve Outcome: Progressing   Problem: Self-Care: Goal: Ability to participate in self-care as condition permits will improve Outcome: Progressing   Problem: Nutrition: Goal: Risk of aspiration will decrease Outcome: Progressing   Problem: Education: Goal: Knowledge of General Education information will improve Description: Including pain rating scale, medication(s)/side effects and non-pharmacologic comfort measures Outcome: Progressing   Problem: Activity: Goal: Risk for activity intolerance will decrease Outcome: Progressing   Problem: Nutrition: Goal: Adequate nutrition will be maintained Outcome: Progressing

## 2023-08-04 NOTE — ED Notes (Signed)
Placed pillow under left knee. Remote given.

## 2023-08-04 NOTE — ED Notes (Signed)
Updated family (Tammy) over the phone. Tammy is talking with the patient on the phone now.

## 2023-08-04 NOTE — ED Triage Notes (Signed)
Pt reports she does not feel dizzy, she is here because her arm is numb. Pt reports she was seen here for the same thing Thursday and they did a CT scan but it showed nothing. Pt reports she broke her neck in August and this has been happening intermittently since then. Pt reports she wants to know what is going on.

## 2023-08-04 NOTE — Assessment & Plan Note (Signed)
No reported chest pain at this time.  - Continue home regimen

## 2023-08-04 NOTE — Assessment & Plan Note (Addendum)
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  Patient had dialysis earlier and appears euvolemic at this time.  - Daily weights - Strict in and out - Hold home metoprolol for now given orthostasis and low blood pressure

## 2023-08-04 NOTE — ED Notes (Signed)
Attempted to call Tammy Pt's caregiver. No answer.

## 2023-08-04 NOTE — ED Provider Notes (Signed)
[facility] Provider Note    Event Date/Time   First MD Initiated Contact with Patient 08/04/23 1106     (approximate)   History   Arm Pain   HPI  Deanna [name] is a 72 y.o. female past medical history significant for prior CVA, ESRD on HD, hypertension, diabetes, CAD, anemia, recent C3 cervical fracture in a cervical collar, peripheral arterial disease, who presents to the emergency department with numbness.  Patient states that she has been having episodes of numbness and tingling sensation to her left arm and leg.  States today her entire left arm was completely numb.  Also having weakness to her left arm and her left leg.  Denies any new falls or trauma.  States that she has been having difficulty with using her left arm since the fracture but states that over the past week it has been getting significantly more weak and numb.  Now also having numbness on her face.  Denies any trouble with speech or trouble swallowing.  States that she was recently evaluated by Dr. Katrinka Blazing with neurosurgery.  Also recently evaluated by Dr. Wyn Quaker with the vascular team.     Physical Exam   Triage Vital Signs: ED Triage Vitals  Encounter Vitals Group     BP 08/04/23 1006 (!) 124/55     Systolic BP Percentile --      Diastolic BP Percentile --      Pulse Rate 08/04/23 1006 87     Resp 08/04/23 1006 12     Temp 08/04/23 1006 98.5 F (36.9 C)     Temp Source 08/04/23 1006 Oral     SpO2 08/04/23 1006 100 %     Weight 08/04/23 1001 160 lb 15 oz (73 kg)     Height 08/04/23 1001 6\' 1"  (1.854 m)     Head Circumference --      Peak Flow --      Pain Score 08/04/23 1001 0     Pain Loc --      Pain Education --      Exclude from Growth Chart --     Most recent vital signs: Vitals:   08/04/23 1200 08/04/23 1430  BP: (!) 115/100 (!) 118/58  Pulse: 82 76  Resp: 16 11  Temp:    SpO2: 98% 100%    Physical Exam Constitutional:      Appearance: She is well-developed.   HENT:     Head: Atraumatic.  Eyes:     Conjunctiva/sclera: Conjunctivae normal.  Neck:     Comments: Cervical collar in place Cardiovascular:     Rate and Rhythm: Regular rhythm.  Pulmonary:     Effort: No respiratory distress.  Abdominal:     General: There is no distension.  Musculoskeletal:        General: Normal range of motion.     Cervical back: Normal range of motion.  Skin:    General: Skin is warm.  Neurological:     Mental Status: She is alert. Mental status is at baseline.     GCS: GCS eye subscore is 4. GCS verbal subscore is 5. GCS motor subscore is 6.     Cranial Nerves: Cranial nerves 2-12 are intact.     Sensory: Sensory deficit present.     Motor: Weakness present.     Comments: Decreased sensation to the left hemibody.  No sensation to the left arm or left leg.  Significant weakness and unable to hold up  the left arm from gravity.  +2 radial pulses that are equal to bilateral upper extremities.  Gait deferred     IMPRESSION / MDM / ASSESSMENT AND PLAN / ED COURSE  I reviewed the triage vital signs and the nursing notes.  Differential diagnosis including cervical hematoma, fracture, CVA, transverse myelitis  EKG  I, Corena Herter, the attending physician, personally viewed and interpreted this ECG.   Rate: Normal  Rhythm: Normal sinus  Intervals: Normal  ST&T Change: None  No tachycardic or bradycardic dysrhythmias while on cardiac telemetry.  RADIOLOGY MRI -cervical spine and MRI of the brain, discussed with radiologist Acute infarct to the right thalamus and posterior right frontal lobe with no hemorrhage.  Osseous findings at 3 and 4 with mild hyperintense signal abnormality to the spinal cord concerning for possible artifact versus myelomalacia.  LABS (all labs ordered are listed, but only abnormal results are displayed) Labs interpreted as -    Labs Reviewed  BASIC METABOLIC PANEL - Abnormal; Notable for the following components:       Result Value   Chloride 95 (*)    Glucose, Bld 145 (*)    Creatinine, Ser 3.19 (*)    GFR, Estimated 15 (*)    All other components within normal limits  CBC - Abnormal; Notable for the following components:   Hemoglobin 11.3 (*)    HCT 35.3 (*)    All other components within normal limits     MDM  No significant electrolyte abnormality.  Creatinine is at her baseline.  Patient did complete dialysis today.  MRI concerning for acute CVA.  Consulted hospitalist for admission for acute CVA.     PROCEDURES:  Critical Care performed: No  Procedures  Patient's presentation is most consistent with acute presentation with potential threat to life or bodily function.   MEDICATIONS ORDERED IN ED: Medications  oxyCODONE-acetaminophen (PERCOCET/ROXICET) 5-325 MG per tablet 1 tablet (1 tablet Oral Given 08/04/23 1212)    FINAL CLINICAL IMPRESSION(S) / ED DIAGNOSES   Final diagnoses:  Cerebrovascular accident (CVA), unspecified mechanism (HCC)     Rx / DC Orders   ED Discharge Orders     None        Note:  This document was prepared using Dragon voice recognition software and may include unintentional dictation errors.   Corena Herter, MD 08/04/23 [phone]

## 2023-08-04 NOTE — H&P (Addendum)
History and Physical    PatientIndira [name] WNU:[id number] DOB: January 06, 1951 DOA: 08/04/2023 DOS: the patient was seen and examined on 08/04/2023 PCP: [facility]  Patient coming from: Home  Chief Complaint:  Chief Complaint  Patient presents with   Arm Pain   HPI: Deanna [name] is a 72 y.o. female with medical history significant of CVA, ESRD on HD, extensive PAD s/p recent angioplasty and stent, HFrEF with last EF of 30-35% (April 2023), hypertension, hyperlipidemia, type 2 diabetes, CAD who presents to the ED due to left arm weakness.  Deanna [name] states that for the last 1 week, she has been experiencing left arm numbness and weakness in addition to her left leg but states her arm symptoms seem more profound.  She had figured this may be due to her recent neck injury as she was recently cleared from wearing her c-collar.  However during dialysis today, she noticed that her symptoms were getting worse and she was unable to sit up without leaning.  Due to this, EMS was called.  Otherwise, she feels in her normal state of health.  Occasionally she experiences some shortness of breath and palpitations when she stands up quickly but otherwise denies any symptoms at rest.  She denies any nausea, vomiting, dizziness, loss of vision, chest pain.  ED course: On arrival to the ED, patient was normotensive at 124/55 with heart rate of 87.  She was saturating at 100% on room air.  She is afebrile at 98.5.  Initial workup notable for hemoglobin of 11.3, glucose 145, creatinine 3.19 and GFR 15. MRI of the brain demonstrated acute linear infarct in the right thalamus and right posterior frontal lobe.  TSH was consulted for admission.  Review of Systems: As mentioned in the history of present illness. All other systems reviewed and are negative.  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   required intubation   Anemia    C3  cervical fracture (HCC) 04/2023   CHF (congestive heart failure) (HCC)    combined systolic and diastolic   Coronary artery disease    ESRD on dialysis (HCC)    T-Th-Sat   GERD (gastroesophageal reflux disease)    Hyperlipidemia    Hypertension    Pneumonia    Secondary hyperparathyroidism of renal origin (HCC)    Stroke (HCC)    with right sided weakness   Type 2 diabetes mellitus with diabetic neuropathy [facility])    Past Surgical History:  Procedure Laterality Date   A/V FISTULAGRAM Right 06/28/2023   Procedure: A/V Fistulagram;  Surgeon: Annice Needy, MD;  Location: [facility];  Service: Cardiovascular;  Laterality: Right;   AV FISTULA PLACEMENT Right 05/17/2023   Procedure: INSERTION OF ARTERIOVENOUS (AV) GORE-TEX GRAFT ARM;  Surgeon: Annice Needy, MD;  Location: [facility];  Service: Vascular;  Laterality: Right;   CARDIAC CATHETERIZATION Left 03/01/2016   Procedure: Left Heart Cath and Coronary Angiography;  Surgeon: Dalia Heading, MD;  Location: [facility];  Service: Cardiovascular;  Laterality: Left;   CESAREAN SECTION     DIALYSIS/PERMA CATHETER INSERTION Right 01/10/2022   Procedure: DIALYSIS/PERMA CATHETER INSERTION;  Surgeon: Bertram Denver, MD;  Location: [facility];  Service: Cardiovascular;  Laterality: Right;   DIALYSIS/PERMA CATHETER INSERTION N/A 04/12/2022   Procedure: DIALYSIS/PERMA CATHETER INSERTION;  Surgeon: Annice Needy, MD;  Location: [facility];  Service: Cardiovascular;  Laterality: N/A;   INTUBATION-ENDOTRACHEAL  WITH TRACHEOSTOMY STANDBY Left 01/14/2022   Procedure: INTUBATION-NASAL WITH TRACHEOSTOMY STANDBY;  Surgeon: Linus Salmons, MD;  Location: [facility];  Service: ENT;  Laterality: Left;   LOWER EXTREMITY ANGIOGRAPHY Right 07/23/2023   Procedure: Lower Extremity Angiography;  Surgeon: Annice Needy, MD;  Location: [facility];  Service: Cardiovascular;  Laterality: Right;   TEMPORARY DIALYSIS CATHETER N/A  01/04/2022   Procedure: TEMPORARY DIALYSIS CATHETER;  Surgeon: Annice Needy, MD;  Location: [facility];  Service: Cardiovascular;  Laterality: N/A;   Social History:  reports that she has never smoked. She has never used smokeless tobacco. She reports that she does not drink alcohol and does not use drugs.  Allergies  Allergen Reactions   Ace Inhibitors Swelling   Entresto [Sacubitril-Valsartan] Swelling    Patient said she took both lisinopril and Entresto so it is not clear which one she actually reacted to.  For safety reasons, she has been advised to avoid both ACE inhibitors and Entresto.   Lisinopril Swelling    Severe Angioedema (requiring Nasotracheal intubation)   Penicillins Rash   Tomato Rash    Family History  Problem Relation Age of Onset   Osteoarthritis Brother     Prior to Admission medications   Medication Sig Start Date End Date Taking? Authorizing Provider  aspirin EC 81 MG tablet Take 81 mg by mouth daily.    [provider]  atorvastatin (LIPITOR) 40 MG tablet Take 40 mg by mouth daily.    [provider]  clopidogrel (PLAVIX) 75 MG tablet Take 1 tablet (75 mg total) by mouth daily. 07/23/23   Annice Needy, MD  ferrous sulfate 325 (65 FE) MG EC tablet Take 325 mg by mouth in the morning. 08/16/21 06/19/24  [provider]  furosemide (LASIX) 20 MG tablet Take 20 mg by mouth daily. 10/28/21   [provider]  gabapentin (NEURONTIN) 100 MG capsule Take 1 capsule (100 mg total) by mouth 2 (two) times daily. 05/19/23   Almon Hercules, MD  methylPREDNISolone (MEDROL DOSEPAK) 4 MG TBPK tablet Take 6 pills on day one then decrease by 1 pill each day 08/02/23   Faythe Ghee, PA-C  metoprolol tartrate (LOPRESSOR) 25 MG tablet Take 1 tablet (25 mg total) by mouth 2 (two) times daily. 01/13/22 06/19/24  Leatha Gilding, MD  oxyCODONE-acetaminophen (PERCOCET) 5-325 MG tablet Take 1 tablet by mouth every 4 (four) hours as needed for  severe pain (pain score 7-10). 08/02/23 08/01/24  Faythe Ghee, PA-C  sevelamer (RENAGEL) 800 MG tablet Take 1,600 mg by mouth 3 (three) times daily with meals.    [provider]  Vitamin D, Ergocalciferol, (DRISDOL) 1.25 MG (50000 UNIT) CAPS capsule Take 50,000 Units by mouth once a week. Wednesday 10/12/21   [provider]    Physical Exam: Vitals:   08/04/23 1001 08/04/23 1006 08/04/23 1200 08/04/23 1430  BP:  (!) 124/55 (!) 115/100 (!) 118/58  Pulse:  87 82 76  Resp:  12 16 11   Temp:  98.5 F (36.9 C)    TempSrc:  Oral    SpO2:  100% 98% 100%  Weight: 73 kg     Height: 6\' 1"  (1.854 m)      Physical Exam Vitals and nursing note reviewed.  Constitutional:      General: She is not in acute distress.    Appearance: She is normal weight.  HENT:     Head: Normocephalic and atraumatic.  Mouth/Throat:     Mouth: Mucous membranes are moist.     Pharynx: Oropharynx is clear.  Eyes:     General: No scleral icterus.    Extraocular Movements: Extraocular movements intact.     Conjunctiva/sclera: Conjunctivae normal.     Pupils: Pupils are equal, round, and reactive to light.  Cardiovascular:     Rate and Rhythm: Normal rate and regular rhythm.     Heart sounds: No murmur heard. Pulmonary:     Effort: Pulmonary effort is normal. No respiratory distress.     Breath sounds: Normal breath sounds. No wheezing, rhonchi or rales.  Abdominal:     General: Bowel sounds are normal. There is no distension.     Palpations: Abdomen is soft.     Tenderness: There is no abdominal tenderness. There is no guarding.  Musculoskeletal:     Right lower leg: No edema.     Left lower leg: No edema.  Skin:    General: Skin is warm and dry.  Neurological:     General: No focal deficit present.     Mental Status: She is alert and oriented to person, place, and time. Mental status is at baseline.     Comments:  Patient is alert and oriented x 4.  No facial asymmetry or  dysarthria No tremors Notable diminished sensation on the left arm and less so of the left leg.  Sensation intact on the right side 5 out of 5 strength of right upper and lower extremity 3 out of 5 strength of left lower extremity 2 out of 5 strength of left upper extremity Weakness of left side limits ability to test coordination  Psychiatric:        Mood and Affect: Mood normal.        Behavior: Behavior normal.    Data Reviewed: CBC WBC of 10.1, hemoglobin of 11.3, MCV of 85.9 and platelets of 283 BMP with sodium of 136, potassium 3.7, bicarb 29, glucose 145, BUN 17, creatinine 3.19 with GFR 15  EKG personally reviewed.  Sinus rhythm with rate of 88.  First-degree AV block.  No ST or T wave changes concerning for acute ischemia.  MR BRAIN WO CONTRAST  Result Date: 08/04/2023 CLINICAL DATA:  Neuro deficit, acute, stroke suspected L sided hemibody numbness. Left upper extremity and lower extremity weakness. Recent cervical fracture; Neck pain, acute, prior cervical surgery L sided weakness EXAM: MRI HEAD WITHOUT CONTRAST MRI CERVICAL SPINE WITHOUT CONTRAST TECHNIQUE: Multiplanar, multiecho pulse sequences of the brain and surrounding structures, and cervical spine, to include the craniocervical junction and cervicothoracic junction, were obtained without intravenous contrast. COMPARISON:  MR C Spine 05/17/23 FINDINGS: MRI HEAD FINDINGS Brain: There is an acute linear infarct in the right thalamus (series 5, image 20) in the posterior right frontal lobe. No acute hemorrhage. No hydrocephalus. No extra-axial fluid collection. There is a background of moderate chronic microvascular ischemic change. 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 Skull and upper cervical spine: Normal marrow signal. Sinuses/Orbits: Small right mastoid effusion. Paranasal sinuses are clear. Bilateral lens replacement. Orbits are otherwise unremarkable. Other: None. MRI CERVICAL SPINE FINDINGS  Alignment: Physiologic. Vertebrae: Redemonstrated osseous findings of DISH. There is T2/STIR hyperintense signal abnormality with the anterior osteophyte at C3-C4 and T1-T2. This signal abnormality at the C3-C4 level likely represents healing posttraumatic changes. Cord: There may be very mild T2 hyperintense signal abnormality cervical spinal cord at the C3-C4 level (series 12, image 10). Posterior Fossa,  vertebral arteries, paraspinal tissues: Negative. Disc levels: Limitations: Note that assessment of the degree of spinal canal or neural foraminal narrowing is limited due to the degree of motion artifact. C1-C2: Mild degenerative change. C2-C3: Minimal disc bulge. Mild spinal canal narrowing. No neural foraminal narrowing. Neural foraminal narrowing. C3-C4: Mild bilateral facet degenerative change. Circumferential disc bulge. Moderate spinal canal narrowing. Moderate left and mild right neural foraminal narrowing. Uncovertebral hypertrophy. C4-C5: Mild bilateral facet degenerative change. No significant disc bulge. Mild spinal canal narrowing. Mild right neural foraminal narrowing. C5-C6: Severe bilateral facet degenerative change. Uncovertebral hypertrophy. Circumferential disc bulge. Moderate spinal canal narrowing. Moderate bilateral neural foraminal narrowing. C6-C7: Moderate bilateral facet degenerative change. Circumferential disc bulge. Moderate spinal canal narrowing. Moderate left and mild right neural foraminal narrowing. C7-T1: Unremarkable IMPRESSION: 1. 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 No hemorrhage. 2. Redemonstrated osseous findings of DISH. T2/STIR hyperintense signal abnormality with the anterior osteophyte at C3-C4 and T1-T2, likely representing healing posttraumatic changes. 3. There may be very mild T2 hyperintense signal abnormality in the cervical spinal cord at the C3-C4 level, which may be artifactual or represent myelomalacia. 4. Additional  degenerative changes as above. Electronically Signed   By: Lorenza Cambridge M.D.   On: 08/04/2023 14:37   MR Cervical Spine Wo Contrast  Result Date: 08/04/2023 CLINICAL DATA:  Neuro deficit, acute, stroke suspected L sided hemibody numbness. Left upper extremity and lower extremity weakness. Recent cervical fracture; Neck pain, acute, prior cervical surgery L sided weakness EXAM: MRI HEAD WITHOUT CONTRAST MRI CERVICAL SPINE WITHOUT CONTRAST TECHNIQUE: Multiplanar, multiecho pulse sequences of the brain and surrounding structures, and cervical spine, to include the craniocervical junction and cervicothoracic junction, were obtained without intravenous contrast. COMPARISON:  MR C Spine 05/17/23 FINDINGS: MRI HEAD FINDINGS Brain: There is an acute linear infarct in the right thalamus (series 5, image 20) in the posterior right frontal lobe. No acute hemorrhage. No hydrocephalus. No extra-axial fluid collection. There is a background of moderate chronic microvascular ischemic change. 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 Skull and upper cervical spine: Normal marrow signal. Sinuses/Orbits: Small right mastoid effusion. Paranasal sinuses are clear. Bilateral lens replacement. Orbits are otherwise unremarkable. Other: None. MRI CERVICAL SPINE FINDINGS Alignment: Physiologic. Vertebrae: Redemonstrated osseous findings of DISH. There is T2/STIR hyperintense signal abnormality with the anterior osteophyte at C3-C4 and T1-T2. This signal abnormality at the C3-C4 level likely represents healing posttraumatic changes. Cord: There may be very mild T2 hyperintense signal abnormality cervical spinal cord at the C3-C4 level (series 12, image 10). Posterior Fossa, vertebral arteries, paraspinal tissues: Negative. Disc levels: Limitations: Note that assessment of the degree of spinal canal or neural foraminal narrowing is limited due to the degree of motion artifact. C1-C2: Mild degenerative change.  C2-C3: Minimal disc bulge. Mild spinal canal narrowing. No neural foraminal narrowing. Neural foraminal narrowing. C3-C4: Mild bilateral facet degenerative change. Circumferential disc bulge. Moderate spinal canal narrowing. Moderate left and mild right neural foraminal narrowing. Uncovertebral hypertrophy. C4-C5: Mild bilateral facet degenerative change. No significant disc bulge. Mild spinal canal narrowing. Mild right neural foraminal narrowing. C5-C6: Severe bilateral facet degenerative change. Uncovertebral hypertrophy. Circumferential disc bulge. Moderate spinal canal narrowing. Moderate bilateral neural foraminal narrowing. C6-C7: Moderate bilateral facet degenerative change. Circumferential disc bulge. Moderate spinal canal narrowing. Moderate left and mild right neural foraminal narrowing. C7-T1: Unremarkable IMPRESSION: 1. 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 No hemorrhage. 2. Redemonstrated  osseous findings of DISH. T2/STIR hyperintense signal abnormality with the anterior osteophyte at C3-C4 and T1-T2, likely representing healing posttraumatic changes. 3. There may be very mild T2 hyperintense signal abnormality in the cervical spinal cord at the C3-C4 level, which may be artifactual or represent myelomalacia. 4. Additional degenerative changes as above. Electronically Signed   By: Lorenza Cambridge M.D.   On: 08/04/2023 14:37    Results are pending, will review when available.  Assessment and Plan:  * Acute CVA (cerebrovascular accident) [facility]) Patient is presenting with an acute infarct of the right thalamus, presenting as left arm + leg weakness/numbness (arm > leg). I suspect worsening of symptoms during dialysis was due to low blood pressure leading to a recrudescent event, however initial infarct occurred when symptoms started 1 week ago.   - Neurology consulted; appreciate their recommendations - CTA head/neck pending - Telemetry monitoring - Start  midodrine 2.5 mg 3 times daily - Echocardiogram - A1C and Lipid panel  - Continue home DAPT - Increase home atorvastatin to 80 mg daily - PT/OT/SLP  HFrEF (heart failure with reduced ejection fraction) (HCC) History of HFrEF with last EF of approximately 35% with global hypokinesis.  Patient had dialysis earlier and appears euvolemic at this time.  - Daily weights - Strict in and out - Hold home metoprolol for now given orthostasis and low blood pressure  End-stage renal disease on hemodialysis [facility]) - Nephrology consulted; appreciate their recommendations - Dialysis per nephrology  Type 2 diabetes mellitus without complications (HCC) - SSI, very sensitive  CAD (coronary artery disease) No reported chest pain at this time.  - Continue home regimen  PAD (peripheral artery disease) (HCC) - Continue home regimen  Closed nondisplaced fracture of third cervical vertebra (HCC) MRI of the C-spine obtained today with no acute findings.  Discussed with patient's neurosurgeon, who she recently had a follow-up with.  No need for continued c-collar use.  Advance Care Planning:   Code Status: Full Code Verified by patient  Consults: Neurology, nephrology  Family Communication: No family at bedside  Severity of Illness: The appropriate patient status for this patient is OBSERVATION. Observation status is judged to be reasonable and necessary in order to provide the required intensity of service to ensure the patient's safety. The patient's presenting symptoms, physical exam findings, and initial radiographic and laboratory data in the context of their medical condition is felt to place them at decreased risk for further clinical deterioration. Furthermore, it is anticipated that the patient will be medically stable for discharge from the hospital within 2 midnights of admission.   Author: Verdene Lennert, MD 08/04/2023 4:58 PM  For on call review [email or web address].

## 2023-08-04 NOTE — Assessment & Plan Note (Signed)
MRI of the C-spine obtained today with no acute findings.  Discussed with patient's neurosurgeon, who she recently had a follow-up with.  No need for continued c-collar use.

## 2023-08-04 NOTE — ED Notes (Signed)
Pt to MRI

## 2023-08-04 NOTE — Assessment & Plan Note (Signed)
-   SSI, very sensitive

## 2023-08-05 ENCOUNTER — Observation Stay: Payer: Medicare HMO

## 2023-08-05 ENCOUNTER — Encounter: Payer: Self-pay | Admitting: Internal Medicine

## 2023-08-05 ENCOUNTER — Inpatient Hospital Stay: Payer: Medicare HMO

## 2023-08-05 DIAGNOSIS — Z79899 Other long term (current) drug therapy: Secondary | ICD-10-CM | POA: Diagnosis not present

## 2023-08-05 DIAGNOSIS — Z880 Allergy status to penicillin: Secondary | ICD-10-CM | POA: Diagnosis not present

## 2023-08-05 DIAGNOSIS — M79602 Pain in left arm: Secondary | ICD-10-CM | POA: Diagnosis not present

## 2023-08-05 DIAGNOSIS — Z888 Allergy status to other drugs, medicaments and biological substances status: Secondary | ICD-10-CM | POA: Diagnosis not present

## 2023-08-05 DIAGNOSIS — E1140 Type 2 diabetes mellitus with diabetic neuropathy, unspecified: Secondary | ICD-10-CM | POA: Diagnosis present

## 2023-08-05 DIAGNOSIS — E1151 Type 2 diabetes mellitus with diabetic peripheral angiopathy without gangrene: Secondary | ICD-10-CM | POA: Diagnosis present

## 2023-08-05 DIAGNOSIS — K219 Gastro-esophageal reflux disease without esophagitis: Secondary | ICD-10-CM | POA: Diagnosis present

## 2023-08-05 DIAGNOSIS — M5412 Radiculopathy, cervical region: Secondary | ICD-10-CM | POA: Diagnosis present

## 2023-08-05 DIAGNOSIS — I639 Cerebral infarction, unspecified: Secondary | ICD-10-CM | POA: Diagnosis present

## 2023-08-05 DIAGNOSIS — E785 Hyperlipidemia, unspecified: Secondary | ICD-10-CM | POA: Diagnosis present

## 2023-08-05 DIAGNOSIS — I5042 Chronic combined systolic (congestive) and diastolic (congestive) heart failure: Secondary | ICD-10-CM | POA: Diagnosis present

## 2023-08-05 DIAGNOSIS — Z955 Presence of coronary angioplasty implant and graft: Secondary | ICD-10-CM | POA: Diagnosis not present

## 2023-08-05 DIAGNOSIS — I2510 Atherosclerotic heart disease of native coronary artery without angina pectoris: Secondary | ICD-10-CM | POA: Diagnosis present

## 2023-08-05 DIAGNOSIS — Z91018 Allergy to other foods: Secondary | ICD-10-CM | POA: Diagnosis not present

## 2023-08-05 DIAGNOSIS — N2581 Secondary hyperparathyroidism of renal origin: Secondary | ICD-10-CM | POA: Diagnosis present

## 2023-08-05 DIAGNOSIS — N186 End stage renal disease: Secondary | ICD-10-CM | POA: Diagnosis present

## 2023-08-05 DIAGNOSIS — D631 Anemia in chronic kidney disease: Secondary | ICD-10-CM | POA: Diagnosis present

## 2023-08-05 DIAGNOSIS — I6340 Cerebral infarction due to embolism of unspecified cerebral artery: Secondary | ICD-10-CM | POA: Diagnosis present

## 2023-08-05 DIAGNOSIS — E1122 Type 2 diabetes mellitus with diabetic chronic kidney disease: Secondary | ICD-10-CM | POA: Diagnosis present

## 2023-08-05 DIAGNOSIS — I132 Hypertensive heart and chronic kidney disease with heart failure and with stage 5 chronic kidney disease, or end stage renal disease: Secondary | ICD-10-CM | POA: Diagnosis present

## 2023-08-05 DIAGNOSIS — M8448XD Pathological fracture, other site, subsequent encounter for fracture with routine healing: Secondary | ICD-10-CM | POA: Diagnosis present

## 2023-08-05 DIAGNOSIS — M4810 Ankylosing hyperostosis [Forestier], site unspecified: Secondary | ICD-10-CM | POA: Diagnosis present

## 2023-08-05 DIAGNOSIS — Z992 Dependence on renal dialysis: Secondary | ICD-10-CM | POA: Diagnosis not present

## 2023-08-05 DIAGNOSIS — G8929 Other chronic pain: Secondary | ICD-10-CM | POA: Diagnosis present

## 2023-08-05 DIAGNOSIS — G8194 Hemiplegia, unspecified affecting left nondominant side: Secondary | ICD-10-CM | POA: Diagnosis present

## 2023-08-05 DIAGNOSIS — Z8673 Personal history of transient ischemic attack (TIA), and cerebral infarction without residual deficits: Secondary | ICD-10-CM | POA: Diagnosis not present

## 2023-08-05 LAB — LIPID PANEL
Cholesterol: 151 mg/dL (ref 0–200)
HDL: 83 mg/dL (ref >40)
LDL Cholesterol: 57 mg/dL (ref 0–99)
Total CHOL/HDL Ratio: 1.8 {ratio}
Triglycerides: 56 mg/dL (ref <150)
VLDL: 11 mg/dL (ref 0–40)

## 2023-08-05 LAB — CBC WITH DIFFERENTIAL/PLATELET
Abs Immature Granulocytes: 0.04 10*3/uL (ref 0.00–0.07)
Basophils Absolute: 0 10*3/uL (ref 0.0–0.1)
Basophils Relative: 1 %
Eosinophils Absolute: 0.2 10*3/uL (ref 0.0–0.5)
Eosinophils Relative: 3 %
HCT: 31.7 % — ABNORMAL LOW (ref 36.0–46.0)
Hemoglobin: 10.3 g/dL — ABNORMAL LOW (ref 12.0–15.0)
Immature Granulocytes: 1 %
Lymphocytes Relative: 28 %
Lymphs Abs: 2 10*3/uL (ref 0.7–4.0)
MCH: 27.3 pg (ref 26.0–34.0)
MCHC: 32.5 g/dL (ref 30.0–36.0)
MCV: 84.1 fL (ref 80.0–100.0)
Monocytes Absolute: 0.6 10*3/uL (ref 0.1–1.0)
Monocytes Relative: 8 %
Neutro Abs: 4.2 10*3/uL (ref 1.7–7.7)
Neutrophils Relative %: 59 %
Platelets: 258 10*3/uL (ref 150–400)
RBC: 3.77 MIL/uL — ABNORMAL LOW (ref 3.87–5.11)
RDW: 14.8 % (ref 11.5–15.5)
WBC: 7.1 10*3/uL (ref 4.0–10.5)
nRBC: 0 % (ref 0.0–0.2)

## 2023-08-05 LAB — BASIC METABOLIC PANEL
Anion gap: 13 (ref 5–15)
BUN: 36 mg/dL — ABNORMAL HIGH (ref 8–23)
CO2: 27 mmol/L (ref 22–32)
Calcium: 9 mg/dL (ref 8.9–10.3)
Chloride: 93 mmol/L — ABNORMAL LOW (ref 98–111)
Creatinine, Ser: 4.88 mg/dL — ABNORMAL HIGH (ref 0.44–1.00)
GFR, Estimated: 9 mL/min — ABNORMAL LOW (ref >60)
Glucose, Bld: 99 mg/dL (ref 70–99)
Potassium: 4.1 mmol/L (ref 3.5–5.1)
Sodium: 133 mmol/L — ABNORMAL LOW (ref 135–145)

## 2023-08-05 LAB — HEMOGLOBIN A1C
Hgb A1c MFr Bld: 5.2 % (ref 4.8–5.6)
Mean Plasma Glucose: 102.54 mg/dL

## 2023-08-05 LAB — GLUCOSE, CAPILLARY
Glucose-Capillary: 102 mg/dL — ABNORMAL HIGH (ref 70–99)
Glucose-Capillary: 94 mg/dL (ref 70–99)
Glucose-Capillary: 100 mg/dL — ABNORMAL HIGH (ref 70–99)
Glucose-Capillary: 115 mg/dL — ABNORMAL HIGH (ref 70–99)

## 2023-08-05 MED ORDER — ATORVASTATIN CALCIUM 20 MG PO TABS
40.0000 mg | ORAL_TABLET | Freq: Every day | ORAL | Status: DC
  Administered 2023-08-06 – 2023-08-09 (×3): 40 mg via ORAL
  Filled 2023-08-05 (×3): qty 2

## 2023-08-05 MED ORDER — CHLORHEXIDINE GLUCONATE CLOTH 2 % EX PADS
6.0000 | MEDICATED_PAD | Freq: Every day | CUTANEOUS | Status: DC
  Administered 2023-08-05 – 2023-08-09 (×4): 6 via TOPICAL

## 2023-08-05 MED ORDER — OXYCODONE-ACETAMINOPHEN 5-325 MG PO TABS
1.0000 | ORAL_TABLET | Freq: Four times a day (QID) | ORAL | Status: DC | PRN
  Administered 2023-08-05 – 2023-08-09 (×6): 1 via ORAL
  Filled 2023-08-05 (×6): qty 1

## 2023-08-05 MED ORDER — OXYCODONE-ACETAMINOPHEN 5-325 MG PO TABS
1.0000 | ORAL_TABLET | Freq: Once | ORAL | Status: AC
  Administered 2023-08-05: 1 via ORAL
  Filled 2023-08-05: qty 1

## 2023-08-05 NOTE — Progress Notes (Signed)
Patient was at the CT room, but PIV access wasn't working. Talked Dr. Myriam Forehand regarding permission of PIV access placement on Lt. Arm. Dr. Myriam Forehand stated that patient needs PIV access for CT scan and it is okay to put in the PIV access on Lt. Side which is weakness arm (Rt. Arm is restricted due to fistular). HS [employer]

## 2023-08-05 NOTE — Evaluation (Signed)
Physical Therapy Evaluation Patient Details Name: Deanna [name] MRN: [id number] DOB: 03-May-1951 Today's Date: 08/05/2023  History of Present Illness  PER H&P for 08/04/2023- Patient is a 72 y/o female presenting to ED with Left arm weakness. She has PMH significant for CVA, ESRD on HD, extensive PAD s/p recent angioplasty and stent, HFrEF with last EF of 30-35% (April 2023), hypertension, hyperlipidemia, type 2 diabetes, CAD    Clinical Impression  Patient presents today with lethargy during PT Evaluation but A&O x 4 and able to communicate her needs. She presents with some reported decreased left side sensation and unable to raise her left arm. She was able to sit up at EOB briefly with max assist with difficulty using her left side. She was unable to remain in seated position and closed her eyes- not responding to commands for a few seconds. She was aroused with sternal rub and nodded her head when asked if she need to lay back down. Unable to assess her standing balance or mobility today but she does present with significant left sided weakness and difficulty with bed mobility and transfers. She will benefit from from further skilled PT in acute setting to address and treat her functional weakness, balance and mobility deficits to improve her safety with mobility and decrease her risk of injury or falls.       If plan is discharge home, recommend the following: A lot of help with walking and/or transfers;A lot of help with bathing/dressing/bathroom;Assistance with cooking/housework;Assistance with feeding;Direct supervision/assist for medications management;Direct supervision/assist for financial management;Assist for transportation;Help with stairs or ramp for entrance   Can travel by private vehicle   No    Equipment Recommendations    Recommendations for Other Services       Functional Status Assessment Patient has had a recent decline in their functional status and demonstrates the  ability to make significant improvements in function in a reasonable and predictable amount of time.     Precautions / Restrictions Precautions Precautions: Fall Restrictions Weight Bearing Restrictions: No      Mobility  Bed Mobility Overal bed mobility: Needs Assistance Bed Mobility: Rolling, Sit to Supine, Supine to Sit Rolling: Mod assist   Supine to sit: Max assist Sit to supine: Total assist   General bed mobility comments: Patient became very lethargic when she sat up and closed eyes- unresponsive for a couple of sec- aroused with sternal rub and she nodded when asked if she needed to lay back down. Max assist back into supine and pad lift transfer +2 to head of bed. Patient Response: Cooperative (lethargic)  Transfers                   General transfer comment: Unable to assess today due to patient lethargy    Ambulation/Gait               General Gait Details: Unable to assess today due to patient lethargy  Stairs            Wheelchair Mobility     Tilt Bed Tilt Bed Patient Response: Cooperative (lethargic)  Modified Rankin (Stroke Patients Only)       Balance Overall balance assessment: Needs assistance Sitting-balance support: Bilateral upper extremity supported, Feet supported (min assist with back/trunk support) Sitting balance-[name]: Poor Sitting balance - Comments: patient lethargic and unable sit without support Postural control: Posterior lean  Pertinent Vitals/Pain Pain Assessment Pain Assessment: No/denies pain    Home Living Family/patient expects to be discharged to:: Private residence Living Arrangements: Non-relatives/Friends Available Help at Discharge: Available PRN/intermittently;Personal care attendant Type of Home: House Home Access: Ramped entrance     Alternate Level Stairs-Number of Steps: pt lives with caregiver in her house per her report- reports  has stair lift to 2nd floor Home Layout: Two level Home Equipment: Educational psychologist (4 wheels);Cane - single Librarian, academic (2 wheels);Wheelchair - power      Prior Function Prior Level of Function : Needs assist       Physical Assist : Mobility (physical);ADLs (physical) Mobility (physical): Stairs ADLs (physical): IADLs Mobility Comments: amb short distances with [facility]       Extremity/Trunk Assessment   Upper Extremity Assessment Upper Extremity Assessment: Defer to OT evaluation    Lower Extremity Assessment Lower Extremity Assessment: LLE deficits/detail LLE Deficits / Details: 2-/5 left Hip flex; abd; 3/5 knee ext and flex and 2-/5 left ankle DF LLE Sensation: decreased light touch LLE Coordination: decreased fine motor;decreased gross motor       Communication   Communication Communication: No apparent difficulties Cueing Techniques: Verbal cues;Tactile cues;Visual cues  Cognition Arousal: Lethargic Behavior During Therapy: WFL for tasks assessed/performed Overall Cognitive Status: Within Functional Limits for tasks assessed                                          General Comments      Exercises     Assessment/Plan    PT Assessment Patient needs continued PT services  PT Problem List Decreased strength;Decreased activity tolerance;Decreased balance;Decreased mobility;Decreased coordination;Decreased safety awareness;Impaired sensation       PT Treatment Interventions DME instruction;Gait training;Stair training;Functional mobility training;Therapeutic activities;Therapeutic exercise;Balance training;Neuromuscular re-education;Patient/family education;Manual techniques;Modalities    PT Goals (Current goals can be found in the Care Plan section)  Acute Rehab PT Goals Patient Stated Goal: I want to walk and get my strength back in my left arm and leg PT Goal Formulation: With patient Time For Goal Achievement: 08/19/23 Potential  to Achieve Goals: Fair    Frequency 7X/week     Co-evaluation               AM-PAC PT "6 Clicks" Mobility  Outcome Measure Help needed turning from your back to your side while in a flat bed without using bedrails?: A Lot Help needed moving from lying on your back to sitting on the side of a flat bed without using bedrails?: A Lot Help needed moving to and from a bed to a chair (including a wheelchair)?: Total Help needed standing up from a chair using your arms (e.g., wheelchair or bedside chair)?: Total Help needed to walk in hospital room?: Total Help needed climbing 3-5 steps with a railing? : Total 6 Click Score: 8    End of Session Equipment Utilized During Treatment: Gait belt Activity Tolerance: Patient limited by lethargy Patient left: in bed;with call bell/phone within reach;with bed alarm set;with nursing/sitter in room Nurse Communication: Mobility status PT Visit Diagnosis: Unsteadiness on feet (R26.81);Other abnormalities of gait and mobility (R26.89);Muscle weakness (generalized) (M62.81);History of falling (Z91.81);Difficulty in walking, not elsewhere classified (R26.2)    Time: 9147-8295 PT Time Calculation (min) (ACUTE ONLY): 41 min   Charges:   PT Evaluation $PT Eval Moderate Complexity: 1 Mod   PT General Charges $$ ACUTE PT  VISIT: 1 Visit         Louis Meckel, PT    Lenda Kelp, PT 08/05/2023, 11:28 AM

## 2023-08-05 NOTE — Progress Notes (Signed)
Patient transported to CT at 1247 in bed in stable condition.  Patient returned in stable condition.

## 2023-08-05 NOTE — Progress Notes (Signed)
Attempted to contact caregiver, Tammy, regarding discharge planning, no answer, voice message left.    Kemper Durie, RN, MSN, CCM [facility] RNCM

## 2023-08-05 NOTE — Consult Note (Signed)
NEUROLOGY CONSULT NOTE   Date of service: August 05, 2023 Patient Name: Deanna [name] MRN:  [id number] DOB:  Aug 12, 1951 Chief Complaint: "Left-sided weakness" Requesting Provider: Lurene Shadow, MD  History of Present Illness  Deanna [name] is a 72 y.o. 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 who presents with numbness and weakness that has been present for a week, but worsened significantly during dialysis yesterday.  She states that she noticed it but was still able to get around and do what she needed at home and therefore did not seek care.  Yesterday during dialysis, she had abrupt worsening of her left-sided weakness and was brought into the emergency department.  MRI reveals two separate infarcts in the right hemisphere.  She has been refusing testing, but did agree to get a CT angio today.  LKW: 1 week ago IV Thrombolysis: No, outside of window  Past History   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   required intubation   Anemia    C3 cervical fracture (HCC) 04/2023   CHF (congestive heart failure) (HCC)    combined systolic and diastolic   Coronary artery disease    ESRD on dialysis (HCC)    T-Th-Sat   GERD (gastroesophageal reflux disease)    Hyperlipidemia    Hypertension    Pneumonia    Secondary hyperparathyroidism of renal origin (HCC)    Stroke (HCC)    with right sided weakness   Type 2 diabetes mellitus with diabetic neuropathy Deanna [name])     Past Surgical History:  Procedure Laterality Date   A/V FISTULAGRAM Right 06/28/2023    Procedure: A/V Fistulagram;  Surgeon: Annice Needy, MD;  Location: [facility];  Service: Cardiovascular;  Laterality: Right;   AV FISTULA PLACEMENT Right 05/17/2023   Procedure: INSERTION OF ARTERIOVENOUS (AV) GORE-TEX GRAFT ARM;  Surgeon: Annice Needy, MD;  Location: [facility];  Service: Vascular;  Laterality: Right;   CARDIAC CATHETERIZATION Left 03/01/2016   Procedure: Left Heart Cath and Coronary Angiography;  Surgeon: Dalia Heading, MD;  Location: [facility];  Service: Cardiovascular;  Laterality: Left;   CESAREAN SECTION     DIALYSIS/PERMA CATHETER INSERTION Right 01/10/2022   Procedure: DIALYSIS/PERMA CATHETER INSERTION;  Surgeon: Bertram Denver, MD;  Location: [facility];  Service: Cardiovascular;  Laterality: Right;   DIALYSIS/PERMA CATHETER INSERTION N/A 04/12/2022   Procedure: DIALYSIS/PERMA CATHETER INSERTION;  Surgeon: Annice Needy, MD;  Location: [facility];  Service: Cardiovascular;  Laterality: N/A;   INTUBATION-ENDOTRACHEAL WITH TRACHEOSTOMY STANDBY Left 01/14/2022   Procedure: INTUBATION-NASAL WITH TRACHEOSTOMY STANDBY;  Surgeon: Linus Salmons, MD;  Location: [facility];  Service: ENT;  Laterality: Left;   LOWER EXTREMITY ANGIOGRAPHY Right 07/23/2023   Procedure: Lower Extremity Angiography;  Surgeon: Annice Needy, MD;  Location: [facility];  Service: Cardiovascular;  Laterality: Right;   TEMPORARY DIALYSIS CATHETER N/A 01/04/2022   Procedure: TEMPORARY DIALYSIS CATHETER;  Surgeon: Annice Needy, MD;  Location: [facility];  Service: Cardiovascular;  Laterality: N/A;    Family History: Family History  Problem Relation Age of Onset   Osteoarthritis Brother     Social History  reports  that she has never smoked. She has never used smokeless tobacco. She reports that she does not drink alcohol and does not use drugs.  Allergies  Allergen Reactions   Ace Inhibitors Swelling   Entresto [Sacubitril-Valsartan] Swelling     Patient said she took both lisinopril and Entresto so it is not clear which one she actually reacted to.  For safety reasons, she has been advised to avoid both ACE inhibitors and Entresto.   Lisinopril Swelling    Severe Angioedema (requiring Nasotracheal intubation)   Penicillins Rash   Tomato Rash    Medications   Current Facility-Administered Medications:    acetaminophen (TYLENOL) tablet 650 mg, 650 mg, Oral, Q4H PRN **OR** acetaminophen (TYLENOL) 160 MG/5ML solution 650 mg, 650 mg, Per Tube, Q4H PRN **OR** acetaminophen (TYLENOL) suppository 650 mg, 650 mg, Rectal, Q4H PRN, Verdene Lennert, MD   aspirin EC tablet 81 mg, 81 mg, Oral, Daily, Verdene Lennert, MD, 81 mg at 08/05/23 0912   [START ON 08/06/2023] atorvastatin (LIPITOR) tablet 40 mg, 40 mg, Oral, Daily, Lurene Shadow, MD   Chlorhexidine Gluconate Cloth 2 % PADS 6 each, 6 each, Topical, Daily, Verdene Lennert, MD, 6 each at 08/05/23 1000   clopidogrel (PLAVIX) tablet 75 mg, 75 mg, Oral, Daily, Verdene Lennert, MD, 75 mg at 08/05/23 0912   enoxaparin (LOVENOX) injection 30 mg, 30 mg, Subcutaneous, Q24H, Verdene Lennert, MD, 30 mg at 08/04/23 2134   ferrous sulfate tablet 325 mg, 325 mg, Oral, q AM, Verdene Lennert, MD, 325 mg at 08/05/23 0912   insulin aspart (novoLOG) injection 0-6 Units, 0-6 Units, Subcutaneous, TID WC, Verdene Lennert, MD   midodrine (PROAMATINE) tablet 2.5 mg, 2.5 mg, Oral, TID WC, Verdene Lennert, MD, 2.5 mg at 08/05/23 1230   ondansetron (ZOFRAN) injection 4 mg, 4 mg, Intravenous, Q6H PRN, Verdene Lennert, MD, 4 mg at 08/04/23 1827   oxyCODONE-acetaminophen (PERCOCET/ROXICET) 5-325 MG per tablet 1 tablet, 1 tablet, Oral, Q6H PRN, Lurene Shadow, MD, 1 tablet at 08/05/23 1041   senna-docusate (Senokot-S) tablet 1 tablet, 1 tablet, Oral, QHS PRN, Verdene Lennert, MD   sevelamer carbonate (RENVELA) tablet 800 mg, 800 mg, Oral, TID WC, Verdene Lennert, MD, 800 mg at 08/05/23 1230  Vitals   Vitals:    08/05/23 0544 08/05/23 0748 08/05/23 0922 08/05/23 1119  BP: 126/60 (!) 110/52 121/63 (!) 119/58  Pulse: 84 81 76 72  Resp: 16 16 16 16   Temp: 97.9 F (36.6 C) 98.3 F (36.8 C) 98.6 F (37 C) 98.3 F (36.8 C)  TempSrc:  Oral Oral   SpO2: 100% 100% 100% 100%  Weight:      Height:        Body mass index is 21.23 kg/m.  Physical Exam   Constitutional: Appears well-developed and well-nourished.  Neurologic Examination    Neuro: Mental Status: Patient is awake, alert, oriented to person, place, month, year, and situation. Patient is able to give a clear and coherent history. No signs of aphasia or neglect Cranial Nerves: II: Visual Fields are full. Pupils are equal, round, and reactive to light.   III,IV, VI: EOMI without ptosis or diploplia.  V: Facial sensation is absent throughout the left side VII: Facial movement with some left facial weakness VIII: hearing is intact to voice X: Uvula elevates symmetrically XI: Shoulder shrug is symmetric. XII: tongue is midline without atrophy or fasciculations.  Motor: She has severe hemiparesis of the left arm and leg with minimal movement at the elbow, she is able to plantarflex  minimally, but no other movement in her leg. Sensory: Sensation is absent throughout the left side Cerebellar: No ataxia in the right    Labs/Imaging/Neurodiagnostic studies   CBC:  Recent Labs  Lab 08-11-23 1020 08/05/23 0907  WBC 10.1 7.1  NEUTROABS  --  4.2  HGB 11.3* 10.3*  HCT 35.3* 31.7*  MCV 85.9 84.1  PLT 283 258    Basic Metabolic Panel:  Lab Results  Component Value Date   NA 133 (L) 08/05/2023   K 4.1 08/05/2023   CO2 27 08/05/2023   GLUCOSE 99 08/05/2023   BUN 36 (H) 08/05/2023   CREATININE 4.88 (H) 08/05/2023   CALCIUM 9.0 08/05/2023   GFRNONAA 9 (L) 08/05/2023   GFRAA 36 (L) 12/09/2017    Lipid Panel:  Lab Results  Component Value Date   LDLCALC 57 08/05/2023    HgbA1c:  Lab Results  Component Value Date    HGBA1C 5.5 12/27/2021    Urine Drug Screen:     Component Value Date/Time   LABOPIA NONE DETECTED 12/27/2021 0244   COCAINSCRNUR NONE DETECTED 12/27/2021 0244   LABBENZ NONE DETECTED 12/27/2021 0244   AMPHETMU NONE DETECTED 12/27/2021 0244   THCU NONE DETECTED 12/27/2021 0244   LABBARB NONE DETECTED 12/27/2021 0244     Alcohol Level No results found for: "ETH"  INR  Lab Results  Component Value Date   INR 1.1 04/28/2023    APTT  Lab Results  Component Value Date   APTT 38 (H) 04/28/2023    AED levels: No results found for: "PHENYTOIN", "ZONISAMIDE", "LAMOTRIGINE", "LEVETIRACETA"     MRI Brain(Personally reviewed): Infarcts in the right thalamus and cortical regions  Impression   Deanna [name] is a 72 y.o. female with a history of hypertension, hyperlipidemia, previous stroke, diabetes, ESRD who presents with at least two infarcts in the right ICA distribution.  My suspicion is for embolic disease, and she will need further evaluation with vascular imaging which she has been very resistant to thus far.  Recommendations  CTA head and neck Aspirin 81 mg and Plavix 75 mg daily LDL is okay at 57 A1c is pending Echocardiogram Neurology will follow  ______________________________________________________________________    Stormy Card [facility]

## 2023-08-05 NOTE — Progress Notes (Signed)
OT Cancellation Note  Patient Details Name: Deanna [name] MRN: [id number] DOB: 03-Oct-1950   Cancelled Treatment:    Reason Eval/Treat Not Completed: Patient at procedure or test/ unavailable. Pt off floor for CT imaging. OT will re-attempt as able.    Otto Caraway L. Leland Staszewski, OTR/L  08/05/23, 1:04 PM

## 2023-08-05 NOTE — Progress Notes (Addendum)
Progress Note    Deanna [name]  WGN:[id number] DOB: 1951/01/08  DOA: 08/04/2023 PCP: [facility]      Brief Narrative:    Medical records reviewed and are as summarized below:  Deanna [name] is a 72 y.o. female  with medical history significant for CVA, ESRD on HD, extensive PAD s/p recent angioplasty and stent, HFrEF with last EF of 30-35% (April 2023), hypertension, hyperlipidemia, type 2 diabetes, CAD, DISH arthritis, C3 anterior osteophyte fracture (was recently cleared by neurosurgeon to discontinue cervical collar on 07/25/2023).  She  presented to the hospital with weakness in the left upper and left lower extremities.  She said her symptoms started about a week ago.    Assessment/Plan:   Principal Problem:   Acute CVA (cerebrovascular accident) (HCC) Active Problems:   HFrEF (heart failure with reduced ejection fraction) (HCC)   End-stage renal disease on hemodialysis (HCC)   Type 2 diabetes mellitus without complications (HCC)   CAD (coronary artery disease)   PAD (peripheral artery disease) (HCC)   Closed nondisplaced fracture of third cervical vertebra (HCC)   Body mass index is 21.23 kg/m.   Acute stroke (infarcts in the right thalamus and posterior right frontal lobe): Continue aspirin and Plavix.  Continue Lipitor. PT OT and ST evaluation. Follow-up with neurologist for further recommendations. Lipid panel: Total cholesterol 151, triglycerides 56, HDL 83, LDL 57.   Chronic systolic and diastolic CHF: Compensated. 2D echo in May 2023 showed EF estimated at 30 to 35%, grade 1 diastolic dysfunction, mild to moderate MR   ESRD on hemodialysis: She last had dialysis on 08/04/2023 prior to admission.  Nephrologist has been consulted for hemodialysis.   CAD, PAD: Recent percutaneous transluminal angioplasty of right peroneal artery and tibioperoneal trunk and right distal SFA and proximal popliteal artery on  07/23/2023. Continue aspirin and Plavix   Closed C3 vertebral fracture: Consulted by Dr. Katrinka Blazing, neurosurgeon to discontinue cervical collar on 07/25/2023   Chronic neck pain, DISH arthritis, chronic anemia   EMR/chart reviewed  Diet Order             Diet heart healthy/carb modified Room service appropriate? Yes; Fluid consistency: Thin  Diet effective now                            Consultants: Neurologist Nephrologist  Procedures: None    Medications:    aspirin EC  81 mg Oral Daily   [START ON 08/06/2023] atorvastatin  40 mg Oral Daily   Chlorhexidine Gluconate Cloth  6 each Topical Daily   clopidogrel  75 mg Oral Daily   enoxaparin (LOVENOX) injection  30 mg Subcutaneous Q24H   ferrous sulfate  325 mg Oral q AM   insulin aspart  0-6 Units Subcutaneous TID WC   midodrine  2.5 mg Oral TID WC   sevelamer carbonate  800 mg Oral TID WC   Continuous Infusions:   Anti-infectives (From admission, onward)    None              Family Communication/Anticipated D/C date and plan/Code Status   DVT prophylaxis: enoxaparin (LOVENOX) injection 30 mg Start: 08/04/23 2200     Code Status: Full Code  Family Communication: None Disposition Plan: Plan to discharge to SNF   Status is: Observation The patient will require care spanning > 2 midnights and should be moved to inpatient because: Acute stroke  Subjective:   Interval events noted.  She complains of weakness and numbness on in her left arm and left leg  Objective:    Vitals:   08/05/23 0544 08/05/23 0748 08/05/23 0922 08/05/23 1119  BP: 126/60 (!) 110/52 121/63 (!) 119/58  Pulse: 84 81 76 72  Resp: 16 16 16 16   Temp: 97.9 F (36.6 C) 98.3 F (36.8 C) 98.6 F (37 C) 98.3 F (36.8 C)  TempSrc:  Oral Oral   SpO2: 100% 100% 100% 100%  Weight:      Height:       No data found.  No intake or output data in the 24 hours ending 08/05/23 1243 Filed Weights    08/04/23 1001  Weight: 73 kg    Exam:  GEN: NAD SKIN: Warm and dry EYES: EOMI, PERRLA ENT: MMM CV: RRR PULM: CTA B ABD: soft, ND, NT, +BS CNS: AAO x 3, LUE-3/5, LLE- 2/5, decreased touch sensation in left upper and lower extremities EXT: No edema or tenderness       Data Reviewed:   I have personally reviewed following labs and imaging studies:  Labs: Labs show the following:   Basic Metabolic Panel: Recent Labs  Lab 08/04/23 1020 08/05/23 0907  NA 136 133*  K 3.7 4.1  CL 95* 93*  CO2 29 27  GLUCOSE 145* 99  BUN 17 36*  CREATININE 3.19* 4.88*  CALCIUM 9.0 9.0   GFR Estimated Creatinine Clearance: 12.2 mL/min (A) (by C-G formula based on SCr of 4.88 mg/dL (H)). Liver Function Tests: No results for input(s): "AST", "ALT", "ALKPHOS", "BILITOT", "PROT", "ALBUMIN" in the last 168 hours. No results for input(s): "LIPASE", "AMYLASE" in the last 168 hours. No results for input(s): "AMMONIA" in the last 168 hours. Coagulation profile No results for input(s): "INR", "PROTIME" in the last 168 hours.  CBC: Recent Labs  Lab 08/04/23 1020 08/05/23 0907  WBC 10.1 7.1  NEUTROABS  --  4.2  HGB 11.3* 10.3*  HCT 35.3* 31.7*  MCV 85.9 84.1  PLT 283 258   Cardiac Enzymes: No results for input(s): "CKTOTAL", "CKMB", "CKMBINDEX", "TROPONINI" in the last 168 hours. BNP (last 3 results) No results for input(s): "PROBNP" in the last 8760 hours. CBG: Recent Labs  Lab 08/04/23 1749 08/04/23 2124 08/05/23 0747 08/05/23 1106  GLUCAP 78 139* 102* 94   D-Dimer: No results for input(s): "DDIMER" in the last 72 hours. Hgb A1c: No results for input(s): "HGBA1C" in the last 72 hours. Lipid Profile: Recent Labs    08/05/23 0907  CHOL 151  HDL 83  LDLCALC 57  TRIG 56  CHOLHDL 1.8   Thyroid function studies: No results for input(s): "TSH", "T4TOTAL", "T3FREE", "THYROIDAB" in the last 72 hours.  Invalid input(s): "FREET3" Anemia work up: No results for input(s):  "VITAMINB12", "FOLATE", "FERRITIN", "TIBC", "IRON", "RETICCTPCT" in the last 72 hours. Sepsis Labs: Recent Labs  Lab 08/04/23 1020 08/05/23 0907  WBC 10.1 7.1    Microbiology No results found for this or any previous visit (from the past 240 hour(s)).  Procedures and diagnostic studies:  MR BRAIN WO CONTRAST  Result Date: 08/04/2023 CLINICAL DATA:  Neuro deficit, acute, stroke suspected L sided hemibody numbness. Left upper extremity and lower extremity weakness. Recent cervical fracture; Neck pain, acute, prior cervical surgery L sided weakness EXAM: MRI HEAD WITHOUT CONTRAST MRI CERVICAL SPINE WITHOUT CONTRAST TECHNIQUE: Multiplanar, multiecho pulse sequences of the brain and surrounding structures, and cervical spine, to include the craniocervical junction and cervicothoracic  junction, were obtained without intravenous contrast. COMPARISON:  MR C Spine 05/17/23 FINDINGS: MRI HEAD FINDINGS Brain: There is an acute linear infarct in the right thalamus (series 5, image 20) in the posterior right frontal lobe. No acute hemorrhage. No hydrocephalus. No extra-axial fluid collection. There is a background of moderate chronic microvascular ischemic change. 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 Skull and upper cervical spine: Normal marrow signal. Sinuses/Orbits: Small right mastoid effusion. Paranasal sinuses are clear. Bilateral lens replacement. Orbits are otherwise unremarkable. Other: None. MRI CERVICAL SPINE FINDINGS Alignment: Physiologic. Vertebrae: Redemonstrated osseous findings of DISH. There is T2/STIR hyperintense signal abnormality with the anterior osteophyte at C3-C4 and T1-T2. This signal abnormality at the C3-C4 level likely represents healing posttraumatic changes. Cord: There may be very mild T2 hyperintense signal abnormality cervical spinal cord at the C3-C4 level (series 12, image 10). Posterior Fossa, vertebral arteries, paraspinal tissues: Negative.  Disc levels: Limitations: Note that assessment of the degree of spinal canal or neural foraminal narrowing is limited due to the degree of motion artifact. C1-C2: Mild degenerative change. C2-C3: Minimal disc bulge. Mild spinal canal narrowing. No neural foraminal narrowing. Neural foraminal narrowing. C3-C4: Mild bilateral facet degenerative change. Circumferential disc bulge. Moderate spinal canal narrowing. Moderate left and mild right neural foraminal narrowing. Uncovertebral hypertrophy. C4-C5: Mild bilateral facet degenerative change. No significant disc bulge. Mild spinal canal narrowing. Mild right neural foraminal narrowing. C5-C6: Severe bilateral facet degenerative change. Uncovertebral hypertrophy. Circumferential disc bulge. Moderate spinal canal narrowing. Moderate bilateral neural foraminal narrowing. C6-C7: Moderate bilateral facet degenerative change. Circumferential disc bulge. Moderate spinal canal narrowing. Moderate left and mild right neural foraminal narrowing. C7-T1: Unremarkable IMPRESSION: 1. 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 No hemorrhage. 2. Redemonstrated osseous findings of DISH. T2/STIR hyperintense signal abnormality with the anterior osteophyte at C3-C4 and T1-T2, likely representing healing posttraumatic changes. 3. There may be very mild T2 hyperintense signal abnormality in the cervical spinal cord at the C3-C4 level, which may be artifactual or represent myelomalacia. 4. Additional degenerative changes as above. Electronically Signed   By: Lorenza Cambridge M.D.   On: 08/04/2023 14:37   MR Cervical Spine Wo Contrast  Result Date: 08/04/2023 CLINICAL DATA:  Neuro deficit, acute, stroke suspected L sided hemibody numbness. Left upper extremity and lower extremity weakness. Recent cervical fracture; Neck pain, acute, prior cervical surgery L sided weakness EXAM: MRI HEAD WITHOUT CONTRAST MRI CERVICAL SPINE WITHOUT CONTRAST TECHNIQUE:  Multiplanar, multiecho pulse sequences of the brain and surrounding structures, and cervical spine, to include the craniocervical junction and cervicothoracic junction, were obtained without intravenous contrast. COMPARISON:  MR C Spine 05/17/23 FINDINGS: MRI HEAD FINDINGS Brain: There is an acute linear infarct in the right thalamus (series 5, image 20) in the posterior right frontal lobe. No acute hemorrhage. No hydrocephalus. No extra-axial fluid collection. There is a background of moderate chronic microvascular ischemic change. 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 Skull and upper cervical spine: Normal marrow signal. Sinuses/Orbits: Small right mastoid effusion. Paranasal sinuses are clear. Bilateral lens replacement. Orbits are otherwise unremarkable. Other: None. MRI CERVICAL SPINE FINDINGS Alignment: Physiologic. Vertebrae: Redemonstrated osseous findings of DISH. There is T2/STIR hyperintense signal abnormality with the anterior osteophyte at C3-C4 and T1-T2. This signal abnormality at the C3-C4 level likely represents healing posttraumatic changes. Cord: There may be very mild T2 hyperintense signal abnormality cervical spinal cord at the C3-C4 level (series 12, image 10).  Posterior Fossa, vertebral arteries, paraspinal tissues: Negative. Disc levels: Limitations: Note that assessment of the degree of spinal canal or neural foraminal narrowing is limited due to the degree of motion artifact. C1-C2: Mild degenerative change. C2-C3: Minimal disc bulge. Mild spinal canal narrowing. No neural foraminal narrowing. Neural foraminal narrowing. C3-C4: Mild bilateral facet degenerative change. Circumferential disc bulge. Moderate spinal canal narrowing. Moderate left and mild right neural foraminal narrowing. Uncovertebral hypertrophy. C4-C5: Mild bilateral facet degenerative change. No significant disc bulge. Mild spinal canal narrowing. Mild right neural foraminal narrowing. C5-C6:  Severe bilateral facet degenerative change. Uncovertebral hypertrophy. Circumferential disc bulge. Moderate spinal canal narrowing. Moderate bilateral neural foraminal narrowing. C6-C7: Moderate bilateral facet degenerative change. Circumferential disc bulge. Moderate spinal canal narrowing. Moderate left and mild right neural foraminal narrowing. C7-T1: Unremarkable IMPRESSION: 1. 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 No hemorrhage. 2. Redemonstrated osseous findings of DISH. T2/STIR hyperintense signal abnormality with the anterior osteophyte at C3-C4 and T1-T2, likely representing healing posttraumatic changes. 3. There may be very mild T2 hyperintense signal abnormality in the cervical spinal cord at the C3-C4 level, which may be artifactual or represent myelomalacia. 4. Additional degenerative changes as above. Electronically Signed   By: Lorenza Cambridge M.D.   On: 08/04/2023 14:37               LOS: 0 days   Ancel Easler  [facility]   Pager on [email or web address]. If 7PM-7AM, please contact night-coverage at [email or web address]     08/05/2023, 12:43 PM

## 2023-08-05 NOTE — Progress Notes (Signed)
[facility]  ROUNDING NOTE   Subjective:   Deanna [name]  is a 72 year old African-American female with a past medical history of coronary artery disease, status post PCI, CHF with reduced ejection fraction, CKD, diabetes mellitus, hypertension, and end stage renal disease on hemodialysis. Patient presents to the ED from her dialysis center with left arm pain and weakness.  Patient has been admitted for Acute CVA (cerebrovascular accident) [facility]) [I63.9] Cerebrovascular accident (CVA), unspecified mechanism (HCC) [I63.9]  Patient is known to our practice and receives outpatient dialysis treatments at [facility] on a TTS schedule, supervised by Dr. Cherylann Ratel.  Patient states she has not missed any recent treatments.  She states she received almost 1 hour of treatment on Saturday.  Patient states she has been having left arm pain and left-sided weakness since her last admission for fractured neck.  At that time patient was discharged with neck brace to wear at all times.  Patient states she was instructed to remove neck brace but due to symptoms, they suggested she wear it again.  Brain MRI shows a acute linear infarct in the right thalamus and posterior right frontal lobe without hemorrhage.  We have been consulted to manage dialysis needs during this admission.   Objective:  Vital signs in last 24 hours:  Temp:  [97.4 F (36.3 C)-98.7 F (37.1 C)] 98.3 F (36.8 C) (11/10 1119) Pulse Rate:  [72-84] 72 (11/10 1119) Resp:  [11-18] 16 (11/10 1119) BP: (110-129)/(52-66) 119/58 (11/10 1119) SpO2:  [99 %-100 %] 100 % (11/10 1119)  Weight change:  Filed Weights   08/04/23 1001  Weight: 73 kg    Intake/Output: No intake/output data recorded.   Intake/Output this shift:  No intake/output data recorded.  Physical Exam: General: NAD  Head: Normocephalic, atraumatic. Moist oral mucosal membranes  Eyes: Anicteric  Lungs:  Clear to auscultation, room air  Heart:  Regular rate and rhythm  Abdomen:  Soft, nontender,   Extremities: Trace peripheral edema.  Neurologic: Alert, moving all four extremities  Skin: No lesions  Access: Rt permcath, AVG placed on 05/17/23     Basic Metabolic Panel: Recent Labs  Lab 08/04/23 1020 08/05/23 0907  NA 136 133*  K 3.7 4.1  CL 95* 93*  CO2 29 27  GLUCOSE 145* 99  BUN 17 36*  CREATININE 3.19* 4.88*  CALCIUM 9.0 9.0    Liver Function Tests: No results for input(s): "AST", "ALT", "ALKPHOS", "BILITOT", "PROT", "ALBUMIN" in the last 168 hours. No results for input(s): "LIPASE", "AMYLASE" in the last 168 hours. No results for input(s): "AMMONIA" in the last 168 hours.  CBC: Recent Labs  Lab 08/04/23 1020 08/05/23 0907  WBC 10.1 7.1  NEUTROABS  --  4.2  HGB 11.3* 10.3*  HCT 35.3* 31.7*  MCV 85.9 84.1  PLT 283 258    Cardiac Enzymes: No results for input(s): "CKTOTAL", "CKMB", "CKMBINDEX", "TROPONINI" in the last 168 hours.  BNP: Invalid input(s): "POCBNP"  CBG: Recent Labs  Lab 08/04/23 1749 08/04/23 2124 08/05/23 0747 08/05/23 1106  GLUCAP 78 139* 102* 94    Microbiology: Results for orders placed or performed during the hospital encounter of 01/20/22  Blood culture (routine x 2)     Status: None   Collection Time: 01/20/22  7:22 PM   Specimen: BLOOD  Result Value Ref Range Status   Specimen Description BLOOD BLOOD RIGHT HAND  Final   Special Requests   Final    BOTTLES DRAWN AEROBIC AND ANAEROBIC Blood Culture results  may not be optimal due to an inadequate volume of blood received in culture bottles   Culture   Final    NO GROWTH 5 DAYS Performed at [facility], [address] [name], [place] [address]    Report Status 01/25/2022 FINAL  Final  Blood culture (routine x 2)     Status: None   Collection Time: 01/20/22  7:22 PM   Specimen: BLOOD  Result Value Ref Range Status   Specimen Description BLOOD BLOOD LEFT HAND  Final   Special Requests   Final     BOTTLES DRAWN AEROBIC AND ANAEROBIC Blood Culture results may not be optimal due to an inadequate volume of blood received in culture bottles   Culture   Final    NO GROWTH 5 DAYS Performed at [facility], [address] [place], [place] [address]    Report Status 01/25/2022 FINAL  Final  Resp Panel by RT-PCR (Flu A&B, Covid) Nasopharyngeal Swab     Status: None   Collection Time: 01/20/22  7:28 PM   Specimen: Nasopharyngeal Swab; Nasopharyngeal(NP) swabs in vial transport medium  Result Value Ref Range Status   SARS Coronavirus 2 by RT PCR NEGATIVE NEGATIVE Final    Comment: (NOTE) SARS-CoV-2 target nucleic acids are NOT DETECTED.  The SARS-CoV-2 RNA is generally detectable in upper respiratory specimens during the acute phase of infection. The lowest concentration of SARS-CoV-2 viral copies this assay can detect is 138 copies/mL. A negative result does not preclude SARS-Cov-2 infection and should not be used as the sole basis for treatment or other patient management decisions. A negative result may occur with  improper specimen collection/handling, submission of specimen other than nasopharyngeal swab, presence of viral mutation(s) within the areas targeted by this assay, and inadequate number of viral copies(<138 copies/mL). A negative result must be combined with clinical observations, patient history, and epidemiological information. The expected result is Negative.  Fact Sheet for Patients:  BloggerCourse.com  Fact Sheet for Healthcare Providers:  SeriousBroker.it  This test is no t yet approved or cleared by the [place] FDA and  has been authorized for detection and/or diagnosis of SARS-CoV-2 by FDA under an Emergency Use Authorization (EUA). This EUA will remain  in effect (meaning this test can be used) for the duration of the COVID-19 declaration under Section 564(b)(1) of the Act, 21 U.S.C.section  360bbb-3(b)(1), unless the authorization is terminated  or revoked sooner.       Influenza A by PCR NEGATIVE NEGATIVE Final   Influenza B by PCR NEGATIVE NEGATIVE Final    Comment: (NOTE) The Xpert Xpress SARS-CoV-2/FLU/RSV plus assay is intended as an aid in the diagnosis of influenza from Nasopharyngeal swab specimens and should not be used as a sole basis for treatment. Nasal washings and aspirates are unacceptable for Xpert Xpress SARS-CoV-2/FLU/RSV testing.  Fact Sheet for Patients: BloggerCourse.com  Fact Sheet for Healthcare Providers: SeriousBroker.it  This test is not yet approved or cleared by the [place] FDA and has been authorized for detection and/or diagnosis of SARS-CoV-2 by FDA under an Emergency Use Authorization (EUA). This EUA will remain in effect (meaning this test can be used) for the duration of the COVID-19 declaration under Section 564(b)(1) of the Act, 21 U.S.C. section 360bbb-3(b)(1), unless the authorization is terminated or revoked.  Performed at [facility], [address] [place], [place] [address]     Coagulation Studies: No results for input(s): "LABPROT", "INR" in the last 72 hours.  Urinalysis:  No results for input(s): "COLORURINE", "LABSPEC", "PHURINE", "GLUCOSEU", "HGBUR", "BILIRUBINUR", "KETONESUR", "PROTEINUR", "UROBILINOGEN", "NITRITE", "LEUKOCYTESUR" in the last 72 hours.  Invalid input(s): "APPERANCEUR"    Imaging: MR BRAIN WO CONTRAST  Result Date: 08/04/2023 CLINICAL DATA:  Neuro deficit, acute, stroke suspected L sided hemibody numbness. Left upper extremity and lower extremity weakness. Recent cervical fracture; Neck pain, acute, prior cervical surgery L sided weakness EXAM: MRI HEAD WITHOUT CONTRAST MRI CERVICAL SPINE WITHOUT CONTRAST TECHNIQUE: Multiplanar, multiecho pulse sequences of the brain and surrounding structures, and cervical spine, to include the  craniocervical junction and cervicothoracic junction, were obtained without intravenous contrast. COMPARISON:  MR C Spine 05/17/23 FINDINGS: MRI HEAD FINDINGS Brain: There is an acute linear infarct in the right thalamus (series 5, image 20) in the posterior right frontal lobe. No acute hemorrhage. No hydrocephalus. No extra-axial fluid collection. There is a background of moderate chronic microvascular ischemic change. 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 Skull and upper cervical spine: Normal marrow signal. Sinuses/Orbits: Small right mastoid effusion. Paranasal sinuses are clear. Bilateral lens replacement. Orbits are otherwise unremarkable. Other: None. MRI CERVICAL SPINE FINDINGS Alignment: Physiologic. Vertebrae: Redemonstrated osseous findings of DISH. There is T2/STIR hyperintense signal abnormality with the anterior osteophyte at C3-C4 and T1-T2. This signal abnormality at the C3-C4 level likely represents healing posttraumatic changes. Cord: There may be very mild T2 hyperintense signal abnormality cervical spinal cord at the C3-C4 level (series 12, image 10). Posterior Fossa, vertebral arteries, paraspinal tissues: Negative. Disc levels: Limitations: Note that assessment of the degree of spinal canal or neural foraminal narrowing is limited due to the degree of motion artifact. C1-C2: Mild degenerative change. C2-C3: Minimal disc bulge. Mild spinal canal narrowing. No neural foraminal narrowing. Neural foraminal narrowing. C3-C4: Mild bilateral facet degenerative change. Circumferential disc bulge. Moderate spinal canal narrowing. Moderate left and mild right neural foraminal narrowing. Uncovertebral hypertrophy. C4-C5: Mild bilateral facet degenerative change. No significant disc bulge. Mild spinal canal narrowing. Mild right neural foraminal narrowing. C5-C6: Severe bilateral facet degenerative change. Uncovertebral hypertrophy. Circumferential disc bulge. Moderate spinal  canal narrowing. Moderate bilateral neural foraminal narrowing. C6-C7: Moderate bilateral facet degenerative change. Circumferential disc bulge. Moderate spinal canal narrowing. Moderate left and mild right neural foraminal narrowing. C7-T1: Unremarkable IMPRESSION: 1. 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 No hemorrhage. 2. Redemonstrated osseous findings of DISH. T2/STIR hyperintense signal abnormality with the anterior osteophyte at C3-C4 and T1-T2, likely representing healing posttraumatic changes. 3. There may be very mild T2 hyperintense signal abnormality in the cervical spinal cord at the C3-C4 level, which may be artifactual or represent myelomalacia. 4. Additional degenerative changes as above. Electronically Signed   By: Lorenza Cambridge M.D.   On: 08/04/2023 14:37   MR Cervical Spine Wo Contrast  Result Date: 08/04/2023 CLINICAL DATA:  Neuro deficit, acute, stroke suspected L sided hemibody numbness. Left upper extremity and lower extremity weakness. Recent cervical fracture; Neck pain, acute, prior cervical surgery L sided weakness EXAM: MRI HEAD WITHOUT CONTRAST MRI CERVICAL SPINE WITHOUT CONTRAST TECHNIQUE: Multiplanar, multiecho pulse sequences of the brain and surrounding structures, and cervical spine, to include the craniocervical junction and cervicothoracic junction, were obtained without intravenous contrast. COMPARISON:  MR C Spine 05/17/23 FINDINGS: MRI HEAD FINDINGS Brain: There is an acute linear infarct in the right thalamus (series 5, image 20) in the posterior right frontal lobe. No acute hemorrhage. No hydrocephalus. No extra-axial fluid collection. There is a background of moderate chronic microvascular  ischemic change. 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 Skull and upper cervical spine: Normal marrow signal. Sinuses/Orbits: Small right mastoid effusion. Paranasal sinuses are clear. Bilateral lens replacement.  Orbits are otherwise unremarkable. Other: None. MRI CERVICAL SPINE FINDINGS Alignment: Physiologic. Vertebrae: Redemonstrated osseous findings of DISH. There is T2/STIR hyperintense signal abnormality with the anterior osteophyte at C3-C4 and T1-T2. This signal abnormality at the C3-C4 level likely represents healing posttraumatic changes. Cord: There may be very mild T2 hyperintense signal abnormality cervical spinal cord at the C3-C4 level (series 12, image 10). Posterior Fossa, vertebral arteries, paraspinal tissues: Negative. Disc levels: Limitations: Note that assessment of the degree of spinal canal or neural foraminal narrowing is limited due to the degree of motion artifact. C1-C2: Mild degenerative change. C2-C3: Minimal disc bulge. Mild spinal canal narrowing. No neural foraminal narrowing. Neural foraminal narrowing. C3-C4: Mild bilateral facet degenerative change. Circumferential disc bulge. Moderate spinal canal narrowing. Moderate left and mild right neural foraminal narrowing. Uncovertebral hypertrophy. C4-C5: Mild bilateral facet degenerative change. No significant disc bulge. Mild spinal canal narrowing. Mild right neural foraminal narrowing. C5-C6: Severe bilateral facet degenerative change. Uncovertebral hypertrophy. Circumferential disc bulge. Moderate spinal canal narrowing. Moderate bilateral neural foraminal narrowing. C6-C7: Moderate bilateral facet degenerative change. Circumferential disc bulge. Moderate spinal canal narrowing. Moderate left and mild right neural foraminal narrowing. C7-T1: Unremarkable IMPRESSION: 1. 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 No hemorrhage. 2. Redemonstrated osseous findings of DISH. T2/STIR hyperintense signal abnormality with the anterior osteophyte at C3-C4 and T1-T2, likely representing healing posttraumatic changes. 3. There may be very mild T2 hyperintense signal abnormality in the cervical spinal cord at the C3-C4  level, which may be artifactual or represent myelomalacia. 4. Additional degenerative changes as above. Electronically Signed   By: Lorenza Cambridge M.D.   On: 08/04/2023 14:37     Medications:     aspirin EC  81 mg Oral Daily   [START ON 08/06/2023] atorvastatin  40 mg Oral Daily   Chlorhexidine Gluconate Cloth  6 each Topical Daily   clopidogrel  75 mg Oral Daily   enoxaparin (LOVENOX) injection  30 mg Subcutaneous Q24H   ferrous sulfate  325 mg Oral q AM   insulin aspart  0-6 Units Subcutaneous TID WC   midodrine  2.5 mg Oral TID WC   sevelamer carbonate  800 mg Oral TID WC   acetaminophen **OR** acetaminophen (TYLENOL) oral liquid 160 mg/5 mL **OR** acetaminophen, iohexol, ondansetron (ZOFRAN) IV, oxyCODONE-acetaminophen, senna-docusate  Assessment/ Plan:  Ms. Kwanita Orman is a 72 y.o.  female with a past medical history of coronary artery disease, status post PCI, CHF with reduced ejection fraction, CKD, diabetes mellitus, hypertension, and end stage renal disease on hemodialysis. Patient presents to the ED from her dialysis center with left arm pain and weakness.  Patient has been admitted for Acute CVA (cerebrovascular accident) [facility]) [I63.9] Cerebrovascular accident (CVA), unspecified mechanism (HCC) [I63.9]  [facility]/TTS/Rt AVG/71.0 kg  1.  Acute stroke.  Brain MRI shows acute lacunar infarct and right numbness and posterior frontal lobe.Currently prescribed aspirin and Plavix.  Patient will follow-up with neurologist.  2.  End-stage renal disease on hemodialysis.  Patient received most of treatment on Saturday prior to ED arrival.  No immediate indication for urgent dialysis today.  Next treatment scheduled for Tuesday.  3. Anemia of chronic kidney disease Lab Results  Component Value Date   HGB 10.3 (L) 08/05/2023  Hemoglobin within optimal range.  Patient receives Mircera at outpatient clinic.  No need for ESA at this time.  4. Secondary  Hyperparathyroidism: with outpatient labs: PTH 776, phosphorus 7.4, calcium 8.1 on 07/10/23.    Lab Results  Component Value Date   PTH 396 (H) 01/08/2022   CALCIUM 9.0 08/05/2023   CAION 1.15 05/17/2023   PHOS 5.0 (H) 05/19/2023    Patient prescribed calcitriol and Cinacalcet outpatient.  Will continue to monitor bone minerals during this admission.  Currently acceptable.   LOS: 0 Kien Mirsky 11/10/20241:00 PM

## 2023-08-05 NOTE — Progress Notes (Signed)
SLP Cancellation Note  Patient Details Name: Deanna [name] MRN: [id number] DOB: [birth year]   Cancelled treatment:       Reason Eval/Treat Not Completed: Other (comment)  This Clinical research associate went by to do a cognition screen/evaluation per MD order. Upon entering room, pt stated "what is it you want to know?" Pt politely refused me. Per chart, pt refused nursing assessment yesterday.  ST will defer to PT/OT for discharge recommendations   Maris Bena B. Dreama Saa, M.S., CCC-SLP, Tree surgeon Certified Brain Injury Specialist [facility]  [facility] Office [phone] Ascom [phone] Fax [phone]

## 2023-08-06 ENCOUNTER — Inpatient Hospital Stay
Admit: 2023-08-06 | Discharge: 2023-08-06 | Disposition: A | Discharge status: Home / Self Care | Payer: Medicare HMO | Attending: Internal Medicine

## 2023-08-06 ENCOUNTER — Inpatient Hospital Stay
Admit: 2023-08-06 | Discharge: 2023-08-06 | Disposition: A | Discharge status: Home / Self Care | Payer: Medicare HMO | Attending: Internal Medicine | Admitting: Internal Medicine

## 2023-08-06 ENCOUNTER — Inpatient Hospital Stay: Payer: Medicare HMO

## 2023-08-06 DIAGNOSIS — I639 Cerebral infarction, unspecified: Secondary | ICD-10-CM | POA: Diagnosis not present

## 2023-08-06 DIAGNOSIS — M79602 Pain in left arm: Secondary | ICD-10-CM

## 2023-08-06 LAB — CBC
HCT: 30.8 % — ABNORMAL LOW (ref 36.0–46.0)
Hemoglobin: 10.1 g/dL — ABNORMAL LOW (ref 12.0–15.0)
MCH: 28 pg (ref 26.0–34.0)
MCHC: 32.8 g/dL (ref 30.0–36.0)
MCV: 85.3 fL (ref 80.0–100.0)
Platelets: 262 10*3/uL (ref 150–400)
RBC: 3.61 MIL/uL — ABNORMAL LOW (ref 3.87–5.11)
RDW: 14.5 % (ref 11.5–15.5)
WBC: 7.1 10*3/uL (ref 4.0–10.5)
nRBC: 0 % (ref 0.0–0.2)

## 2023-08-06 LAB — BASIC METABOLIC PANEL
Anion gap: 12 (ref 5–15)
BUN: 59 mg/dL — ABNORMAL HIGH (ref 8–23)
CO2: 27 mmol/L (ref 22–32)
Calcium: 9.1 mg/dL (ref 8.9–10.3)
Chloride: 95 mmol/L — ABNORMAL LOW (ref 98–111)
Creatinine, Ser: 6.75 mg/dL — ABNORMAL HIGH (ref 0.44–1.00)
GFR, Estimated: 6 mL/min — ABNORMAL LOW (ref >60)
Glucose, Bld: 99 mg/dL (ref 70–99)
Potassium: 5.2 mmol/L — ABNORMAL HIGH (ref 3.5–5.1)
Sodium: 134 mmol/L — ABNORMAL LOW (ref 135–145)

## 2023-08-06 LAB — GLUCOSE, CAPILLARY
Glucose-Capillary: 85 mg/dL (ref 70–99)
Glucose-Capillary: 101 mg/dL — ABNORMAL HIGH (ref 70–99)

## 2023-08-06 LAB — TROPONIN I (HIGH SENSITIVITY): Troponin I (High Sensitivity): 20 ng/L — ABNORMAL HIGH (ref <18)

## 2023-08-06 LAB — HEPATITIS B SURFACE ANTIGEN: Hepatitis B Surface Ag: NONREACTIVE

## 2023-08-06 MED ORDER — NITROGLYCERIN 0.4 MG SL SUBL
0.4000 mg | SUBLINGUAL_TABLET | SUBLINGUAL | Status: AC
  Administered 2023-08-06: 0.4 mg via SUBLINGUAL

## 2023-08-06 MED ORDER — IOHEXOL 350 MG/ML SOLN
75.0000 mL | Freq: Once | INTRAVENOUS | Status: AC | PRN
  Administered 2023-08-06: 75 mL via INTRAVENOUS

## 2023-08-06 MED ORDER — ACETAMINOPHEN 650 MG RE SUPP: 650.0000 mg | RECTAL | Status: DC | PRN

## 2023-08-06 MED ORDER — FERROUS SULFATE 325 (65 FE) MG PO TABS
325.0000 mg | ORAL_TABLET | Freq: Every day | ORAL | Status: DC
  Administered 2023-08-06 – 2023-08-09 (×3): 325 mg via ORAL
  Filled 2023-08-06 (×3): qty 1

## 2023-08-06 MED ORDER — HEPARIN SODIUM (PORCINE) 5000 UNIT/ML IJ SOLN
5000.0000 [IU] | Freq: Three times a day (TID) | INTRAMUSCULAR | Status: DC
  Administered 2023-08-06 – 2023-08-09 (×9): 5000 [IU] via SUBCUTANEOUS
  Filled 2023-08-06 (×10): qty 1

## 2023-08-06 MED ORDER — FENTANYL CITRATE PF 50 MCG/ML IJ SOSY
12.5000 ug | PREFILLED_SYRINGE | INTRAMUSCULAR | Status: DC | PRN
Start: 2023-08-06 — End: 2023-08-09

## 2023-08-06 MED ORDER — ACETAMINOPHEN 325 MG PO TABS: 650.0000 mg | ORAL_TABLET | ORAL | Status: DC | PRN

## 2023-08-06 MED ORDER — ACETAMINOPHEN 160 MG/5ML PO SOLN: 650.0000 mg | ORAL | Status: DC | PRN
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
## 2023-08-06 NOTE — Plan of Care (Signed)
  Problem: Coping: Goal: Ability to adjust to condition or change in health will improve Outcome: Progressing   Problem: Health Behavior/Discharge Planning: Goal: Ability to manage health-related needs will improve Outcome: Progressing   Problem: Ischemic Stroke/TIA Tissue Perfusion: Goal: Complications of ischemic stroke/TIA will be minimized Outcome: Progressing

## 2023-08-06 NOTE — Progress Notes (Signed)
Called report to 2A twice and unable to give report to nurse.

## 2023-08-06 NOTE — Significant Event (Addendum)
Rapid Response Event Note   Reason for Call :  Unresponsive, chest pain  Initial Focused Assessment:  Rapid response RN arrived in patient's room with patient lying in bed surrounded by 1C staff who were obtaining a 12 lead EKG at the time. Patient with eyes shut, eyelids flickering. Eyes rolled upwards. Unresponsive to noxious stimuli. Per report from Debi RN, patient had reported 10 out of 10 chest pain, was administered prn pain medication with no difficulties, and then became unresponsive. CBG 101. BP 155/66 MAP 91, HR 75, RR 18 unlabored, 100% oxygen saturations on room air.   After rapid response RN had been present about 30 seconds, patient began to arouse. Initially alert and able to follow commands on right, but unable to move left (Debi RN reports this has been going on this admission and patient is being worked up for stroke), unable to physically speak. After a few minutes, patient able to speak and reports still having left chest pain 10 out of 10.   Interventions:  Nitroglycerin 0.4 mg sublingual one time dose given per orders of Dr. Myriam Forehand. Patient's chest pain lowered to 5 out of 10.   Plan of Care:  Dr. Myriam Forehand reaching out to neurology to update on plan of care. Dr. Myriam Forehand ordered transfer to [facility]. Updated Debi RN, 1C charge Charity fundraiser, and [facility].  Update 17:54: Before rapid response RN left patient, patient reporting that she feels like she cannot move her right side as well as before. Right side with new numbness and tingling, decreased sensation and strength. Initally at 17:54 unable to hold up but then after a minute or two about to hold for a few seconds before hitting bed. Grip weaker than previous assessment. Right leg sensation and movement intact. No other neurologic changes notes (patient alert, able to follow commands, no dysarthria, etc...). Spoke with Dr. Myriam Forehand and updated him and then spoke with Dr. Iver Nestle and updated her on the situation. Dr. Iver Nestle ordered new  imaging for patient. Escorted patient to CT for imaging and returned her to 118, while she is waiting on her bed on 2A to be cleaned.  Event Summary:   MD Notified: Dr. Myriam Forehand Call Time: 17:26 Arrival Time: 17:28 End Time: 18:43  Jovonta Levit, Theresia Lo, RN

## 2023-08-06 NOTE — Progress Notes (Signed)
   08/06/23 1700  Spiritual Encounters  Type of Visit Initial  Referral source Code page  Reason for visit Code  OnCall Visit Yes   Chaplain responded to Rapid Response Code. Patient receiving care from response team. No family present.

## 2023-08-06 NOTE — Progress Notes (Addendum)
SLP Cancellation Note  Patient Details Name: Deanna [name] MRN: [id number] DOB: [birth year]   Cancelled treatment:       Reason Eval/Treat Not Completed: SLP screened, no needs identified, will sign off (chart reviewed; consulted NSG then met w/ pt)  Pt denied any difficulty swallowing and is currently on a regular diet; tolerates swallowing pills w/ water per NSG. Pt talked about some of the meats being "too tough and thick" -- encouraged pt to tell the Kitchen when she calls them for meals to "cut the meats and add gravies"; any foods can be cut if asked for it. Pt thankful for the information and agreed to tell the Staff.  Pt conversed in general conversation w/out overt expressive/receptive deficits noted; pt denied any speech-language deficits. Speech clear - pt Edentulous. Pt described calling the Kitchen for her Dinner meal tonight and asking for food/drink items -- she was pleased that she could order Breakfast foods for dinner. Pt engaged in pleasant social speech; indicated her wants/needs appropriately - NSG agreed.  No further skilled ST services indicated as pt appears at her communication baseline. If any needs are noted when back in her home ADL setting, outpatient services can be had then per PCP. Pt agreed. NSG to reconsult if any change in status while admitted.       Jerilynn Som, MS, CCC-SLP Speech Language Pathologist Rehab Services; [facility] [phone] (ascom) Kham Zuckerman 08/06/2023, 3:11 PM

## 2023-08-06 NOTE — Progress Notes (Signed)
NEUROLOGY CONSULT FOLLOW UP NOTE   Date of service: August 06, 2023 Patient Name: Deanna [name] MRN:  [id number] DOB:  July 26, 1951  Brief HPI  Deanna [name] is a 72 y.o. 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 who presents with numbness and weakness that has been present for a week, but worsened significantly during dialysis on 11/09 (day PTA).  She states that she noticed it but was still able to get around and do what she needed at home and therefore did not seek care.  Yesterday during dialysis, she had abrupt worsening of her left-sided weakness and was brought into the emergency department.   MRI reveals two separate infarcts in the right hemisphere.  She had been refusing testing, but did agree to get a CT angio 11/10.     Current Facility-Administered Medications:    acetaminophen (TYLENOL) tablet 650 mg, 650 mg, Oral, Q4H PRN **OR** acetaminophen (TYLENOL) 160 MG/5ML solution 650 mg, 650 mg, Per Tube, Q4H PRN **OR** acetaminophen (TYLENOL) suppository 650 mg, 650 mg, Rectal, Q4H PRN, Verdene Lennert, MD   aspirin EC tablet 81 mg, 81 mg, Oral, Daily, Verdene Lennert, MD, 81 mg at 08/05/23 0912   atorvastatin (LIPITOR) tablet 40 mg, 40 mg, Oral, Daily, Lurene Shadow, MD   Chlorhexidine Gluconate Cloth 2 % PADS 6 each, 6 each, Topical, Daily, Verdene Lennert, MD, 6 each at 08/06/23 0119   clopidogrel (PLAVIX) tablet 75 mg, 75 mg, Oral, Daily, Verdene Lennert, MD, 75 mg at 08/05/23 0912   enoxaparin (LOVENOX) injection 30 mg, 30 mg, Subcutaneous, Q24H, Verdene Lennert, MD, 30 mg at 08/05/23 2108   ferrous  sulfate tablet 325 mg, 325 mg, Oral, Q breakfast, Lurene Shadow, MD   midodrine (PROAMATINE) tablet 2.5 mg, 2.5 mg, Oral, TID WC, Verdene Lennert, MD, 2.5 mg at 08/05/23 1752   ondansetron (ZOFRAN) injection 4 mg, 4 mg, Intravenous, Q6H PRN, Verdene Lennert, MD, 4 mg at 08/04/23 1827   oxyCODONE-acetaminophen (PERCOCET/ROXICET) 5-325 MG per tablet 1 tablet, 1 tablet, Oral, Q6H PRN, Lurene Shadow, MD, 1 tablet at 08/06/23 0205   senna-docusate (Senokot-S) tablet 1 tablet, 1 tablet, Oral, QHS PRN, Verdene Lennert, MD   sevelamer carbonate (RENVELA) tablet 800 mg, 800 mg, Oral, TID WC, Verdene Lennert, MD, 800 mg at 08/05/23 1752    Interval Hx/subjective  - Due to motion limited CTA, Carotid US was obtained - Ambulated with PT w/ walker "decreased L hip/knee flexion during swing phase, decreased dorsiflexion LLE, decreased heel strike LLE, cuing for upright vs significantly flexed posture but poor return demo " OT notes "LUE Deficits / Details: grip 3+/5, mildly impaired FMC... LLE Deficits / Details: 2-/5 L hip flexor and ankle DF"  - She reports no new symptoms, reports that she moved during CTA due to pain and is willing to try again with pain medications on board - She did recall having a stroke last year but did not recall our discussion at that time that it was a punctate/incidental stroke related to her recent cardiac catheterization at the time and would not have expected to result in any significant symptoms - She notes that her most significant symptom continues to be pain in the left side which is what most limits her  movement  Vitals   Current vital signs: BP (!) 134/59 (BP Location: Left Arm)   Pulse 80   Temp 97.8 F (36.6 C)   Resp 20   Ht 6\' 1"  (1.854 m)   Wt 73 kg   SpO2 100%   BMI 21.23 kg/m  Vital signs in last 24 hours: Temp:  [97.8 F (36.6 C)-98.6 F (37 C)] 97.8 F (36.6 C) (11/11 0512) Pulse Rate:  [71-80] 80 (11/11 0512) Resp:  [16-20] 20 (11/11 0512) BP:  (119-134)/(53-63) 134/59 (11/11 0512) SpO2:  [99 %-100 %] 100 % (11/11 0512)     Body mass index is 21.23 kg/m.  Physical Exam   Constitutional: Appears thin/frail but in no acute distress Psych: Calm and cooperative Respiratory: Effort normal, non-labored breathing.  GI:  No distension.   Neurologic Examination    Neuro:  Mental Status: Oriented to situation, fluent conversational speech Cranial Nerves: Minimal left facial droop.  Tracking examiner, hearing intact to voice, tongue midline Motor: Declines formal testing due to pain.  However she is using her left upper extremity spontaneously at least 2-3/5 at the elbow and wrist.  Curled up on the chair with her knees bent bilaterally sensory: Sensation is absent throughout the left side   Labs and Diagnostic Imaging   CBC:  Recent Labs  Lab 08/04/23 1020 08/05/23 0907  WBC 10.1 7.1  NEUTROABS  --  4.2  HGB 11.3* 10.3*  HCT 35.3* 31.7*  MCV 85.9 84.1  PLT 283 258    Basic Metabolic Panel:  Lab Results  Component Value Date   NA 133 (L) 08/05/2023   K 4.1 08/05/2023   CO2 27 08/05/2023   GLUCOSE 99 08/05/2023   BUN 36 (H) 08/05/2023   CREATININE 4.88 (H) 08/05/2023   CALCIUM 9.0 08/05/2023   GFRNONAA 9 (L) 08/05/2023   GFRAA 36 (L) 12/09/2017   Lipid Panel:  Lab Results  Component Value Date   CHOL 151 08/05/2023   HDL 83 08/05/2023   LDLCALC 57 08/05/2023   TRIG 56 08/05/2023   CHOLHDL 1.8 08/05/2023   HgbA1c:  Lab Results  Component Value Date   HGBA1C 5.2 08/05/2023   Urine Drug Screen:     Component Value Date/Time   LABOPIA NONE DETECTED 12/27/2021 0244   COCAINSCRNUR NONE DETECTED 12/27/2021 0244   LABBENZ NONE DETECTED 12/27/2021 0244   AMPHETMU NONE DETECTED 12/27/2021 0244   THCU NONE DETECTED 12/27/2021 0244   LABBARB NONE DETECTED 12/27/2021 0244    Alcohol Level No results found for: "ETH" INR  Lab Results  Component Value Date   INR 1.1 04/28/2023   APTT  Lab  Results  Component Value Date   APTT 38 (H) 04/28/2023   AED levels: No results found for: "PHENYTOIN", "ZONISAMIDE", "LAMOTRIGINE", "LEVETIRACETA"   CT angio Head and Neck with contrast(Personally reviewed): 1. No acute intracranial abnormality. Chronic infarcts in the left caudate head. Infarcts seen on recent brain MRI are not visualized due to CT technique. 2. No intracranial large vessel occlusion. Mild narrowing of the supraclinoid ICA on the right. 3. Note that assessment of the neck vasculature is markedly limited due to the degree of motion artifact. Within this limitation, no definite evidence of an alveolar or hemodynamically significant stenosis. 4. Patulous and fluid-filled esophagus, which could predispose to aspiration. 5. Osseous findings of DISH with likely at least moderate spinal canal narrowing at C5-C6 and C6-C7.  Carotid ultrasound Right: Color duplex indicates moderate heterogeneous and calcified plaque, with no  hemodynamically significant stenosis by duplex criteria in the extracranial cerebrovascular circulation. Note that flow velocities of the right ICA were obtained from an area distal to the maximum narrowing due to the presence of anterior wall plaque with shadowing and may be underestimating the percentage of ICA stenosis. If establishing a more accurate degree of stenosis is required, cerebral angiogram should be considered, or as a second best test, CTA.   Left: Color duplex indicates moderate heterogeneous and calcified plaque, with no hemodynamically significant stenosis by duplex criteria in the extracranial cerebrovascular circulation.  MRI Brain(Personally reviewed): 1. 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 No hemorrhage. 2. Redemonstrated osseous findings of DISH. T2/STIR hyperintense signal abnormality with the anterior osteophyte at C3-C4 and T1-T2, likely representing healing posttraumatic  changes. 3. There may be very mild T2 hyperintense signal abnormality in the cervical spinal cord at the C3-C4 level, which may be artifactual or represent myelomalacia. 4. Additional degenerative changes as above.   Lab Results  Component Value Date   HGBA1C 5.2 08/05/2023     Impression   Faelynn Coy is a 72 y.o. female with a highly embolic appearing stroke in the right hemisphere.  She does have calcified plaque in the right ICA, but we are unable to quantify this due to motion during her CTA, as well as calcification of the plaque which precludes accurate ultrasound assessment.  After discussion with the patient she reports that motion was secondary to pain and that she is willing to reattempt a CTA with pain medication on board.  Echocardiogram is still pending.  Given prior echocardiogram did show mild atrial dilation, if vessel imaging is unrevealing, I do think that longer-term monitoring (Zio patch versus loop recorder) would be helpful to rule out occult atrial fibrillation  From a stroke risk perspective she is otherwise meeting A1c and LDL goals  Recommendations  -Repeat CTA head and neck -Fentanyl 12.5 mcg every 5 minutes as needed for 2 doses to facilitate CTA head and neck -Echocardiogram report pending -Neurology will follow-up CTA head and neck and echocardiogram report.  Otherwise will sign off at this time, please reach out if other questions or concerns arise ______________________________________________________________________   Thank you for the opportunity to take part in the care of this patient. If you have any further questions, please contact the neurology consultation team on call. Updated oncall schedule is listed on [facility]ION.  Signed,  Shonta Phillis L Bessye Stith

## 2023-08-06 NOTE — Evaluation (Signed)
Occupational Therapy Evaluation Patient Details Name: Deanna [name] MRN: [id number] DOB: 1951/04/03 Today's Date: 08/06/2023   History of Present Illness Deanna [name] is a 72 y.o. female with medical history significant of CVA, ESRD on HD, extensive PAD s/p recent angioplasty and stent, HFrEF with last EF of 30-35% (April 2023), hypertension, hyperlipidemia, type 2 diabetes, CAD who presents to the ED due to left arm weakness. Brain MRI 08/04/23: Acute linear infarct in the right thalamus and posterior right frontal lobe.   Clinical Impression   Deanna [name] was seen for OT evaluation this date. Prior to hospital admission, pt was MOD I using SPC for mobility, assist for meals from caregiver. Pt lives at caregivers house, stays on the second floor. Pt presents to acute OT demonstrating impaired ADL performance and functional mobility 2/2 decreased activity tolerance and impaired functional use of non-dominant LUE. L grip 3-/5.  Pt currently requires SUPERVISION exit bed, use of BUE to manage LLE. CGA + AD for toilet t/f and standing grooming tasks - cues for hand placement, trialed with RW and SPC. Poor safety awareness noted with both AD trials - pt is unsafe to mobilize without +1 assist. Pt would benefit from skilled OT to address noted impairments and functional limitations (see below for any additional details). Upon hospital discharge, recommend OT follow up.     If plan is discharge home, recommend the following: A little help with walking and/or transfers;A little help with bathing/dressing/bathroom;Help with stairs or ramp for entrance    Functional Status Assessment  Patient has had a recent decline in their functional status and demonstrates the ability to make significant improvements in function in a reasonable and predictable amount of time.  Equipment Recommendations  BSC/3in1    Recommendations for Other Services       Precautions / Restrictions Precautions Precautions:  Fall Precaution Comments: L hemi Restrictions Weight Bearing Restrictions: No      Mobility Bed Mobility Overal bed mobility: Needs Assistance Bed Mobility: Supine to Sit     Supine to sit: Supervision, Used rails     General bed mobility comments: use of BUE to manage LLE    Transfers Overall transfer level: Needs assistance Equipment used: Straight cane, Rolling walker (2 wheels) Transfers: Sit to/from Stand Sit to Stand: Contact guard assist           General transfer comment: cues for hand placement, trialed with RW and SPC      Balance Overall balance assessment: Needs assistance Sitting-balance support: No upper extremity supported, Bilateral upper extremity supported, Feet supported Sitting balance-[name]: Fair     Standing balance support: During functional activity, Single extremity supported Standing balance-[name]: Fair Standing balance comment: heavy lean on forearms with hand washing                           ADL either performed or assessed with clinical judgement   ADL Overall ADL's : Needs assistance/impaired                                       General ADL Comments: CGA + SPC/RW for toilet t/f and standing grooming tasks. SUPERVISION pericare in sitting and clothing mgmt      Pertinent Vitals/Pain Pain Assessment Pain Assessment: No/denies pain     Extremity/Trunk Assessment Upper Extremity Assessment Upper Extremity Assessment: Right hand dominant;LUE deficits/detail LUE  Deficits / Details: grip 3+/5, mildly impaired [facility]   Lower Extremity Assessment Lower Extremity Assessment: LLE deficits/detail LLE Deficits / Details: 2-/5 L hip flexor and ankle DF       Communication Communication Communication: No apparent difficulties Cueing Techniques: Verbal cues   Cognition Arousal: Alert Behavior During Therapy: WFL for tasks assessed/performed Overall Cognitive Status: Within Functional Limits  for tasks assessed                                 General Comments: poor safety awareness     General Comments  Pt with continent void on toilet.            Home Living Family/patient expects to be discharged to:: Private residence Living Arrangements: Non-relatives/Friends Available Help at Discharge: Available PRN/intermittently;Personal care attendant Type of Home: House Home Access: Ramped entrance     Home Layout: Two level Alternate Level Stairs-Number of Steps: stair lift   Bathroom Shower/Tub: Walk-in shower         Home Equipment: Educational psychologist (4 wheels);Cane - single Librarian, academic (2 wheels)   Additional Comments: pt lives at a caregivers hosue, states she is not allowed to live on first floor and often is left alone      Prior Functioning/Environment Prior Level of Function : Independent/Modified Independent             Mobility Comments: amb short distances with SPC ADLs Comments: assist for meals as pt caregiver willing to cook them        OT Problem List: Decreased strength;Decreased range of motion;Decreased activity tolerance;Impaired balance (sitting and/or standing);Decreased safety awareness      OT Treatment/Interventions: Self-care/ADL training;Therapeutic exercise;DME and/or AE instruction;Energy conservation;Therapeutic activities;Balance training;Patient/family education    OT Goals(Current goals can be found in the care plan section) Acute Rehab OT Goals Patient Stated Goal: to d/c to sons house OT Goal Formulation: With patient Time For Goal Achievement: 08/20/23 Potential to Achieve Goals: Good ADL Goals Pt Will Perform Grooming: with modified independence;standing Pt Will Perform Lower Body Dressing: with modified independence;sit to/from stand Pt Will Transfer to Toilet: with modified independence;ambulating;regular height toilet  OT Frequency: Min 1X/week    Co-evaluation PT/OT/SLP  Co-Evaluation/Treatment: Yes Reason for Co-Treatment: Necessary to address cognition/behavior during functional activity;For patient/therapist safety PT goals addressed during session: Mobility/safety with mobility OT goals addressed during session: ADL's and self-care      AM-PAC OT "6 Clicks" Daily Activity     Outcome Measure Help from another person eating meals?: None Help from another person taking care of personal grooming?: A Little Help from another person toileting, which includes using toliet, bedpan, or urinal?: A Little Help from another person bathing (including washing, rinsing, drying)?: A Lot Help from another person to put on and taking off regular upper body clothing?: A Little Help from another person to put on and taking off regular lower body clothing?: A Lot 6 Click Score: 17   End of Session Equipment Utilized During Treatment: Gait belt;Rolling walker (2 wheels) Nurse Communication: Mobility status  Activity Tolerance: Patient tolerated treatment well Patient left: in chair;with call bell/phone within reach;with chair alarm set;with nursing/sitter in room  OT Visit Diagnosis: Other abnormalities of gait and mobility (R26.89);Muscle weakness (generalized) (M62.81)                Time: 8295-6213 OT Time Calculation (min): 32 min Charges:  OT General Charges $OT Visit: 1  Visit OT Evaluation $OT Eval Moderate Complexity: 1 Mod OT Treatments $Self Care/Home Management : 8-22 mins  Kathie Dike, M.S. OTR/L  08/06/23, 9:58 AM  ascom [phone]

## 2023-08-06 NOTE — Progress Notes (Signed)
*  PRELIMINARY RESULTS* Echocardiogram 2D Echocardiogram has been performed.  Cristela Blue 08/06/2023, 10:54 AM

## 2023-08-06 NOTE — Progress Notes (Addendum)
Progress Note    Deanna [name]  UXN:[id number] DOB: 1950/12/30  DOA: 08/04/2023 PCP: [facility]      Brief Narrative:    Medical records reviewed and are as summarized below:  Deanna [name] is a 72 y.o. female  with medical history significant for CVA, ESRD on HD, extensive PAD s/p recent angioplasty and stent, HFrEF with last EF of 30-35% (April 2023), hypertension, hyperlipidemia, type 2 diabetes, CAD, DISH arthritis, C3 anterior osteophyte fracture (was recently cleared by neurosurgeon to discontinue cervical collar on 07/25/2023).  She  presented to the hospital with weakness in the left upper and left lower extremities.  She said her symptoms started about a week ago.    Assessment/Plan:   Principal Problem:   Acute CVA (cerebrovascular accident) (HCC) Active Problems:   HFrEF (heart failure with reduced ejection fraction) (HCC)   End-stage renal disease on hemodialysis (HCC)   Type 2 diabetes mellitus without complications (HCC)   CVA (cerebral vascular accident) (HCC)   CAD (coronary artery disease)   PAD (peripheral artery disease) (HCC)   Closed nondisplaced fracture of third cervical vertebra (HCC)   Body mass index is 21.23 kg/m.   Acute stroke (infarcts in the right thalamus and posterior right frontal lobe): Continue aspirin and Plavix.  Continue Lipitor. No hemodynamically significant stenosis on carotid duplex ultrasound 2D echo is pending. PT and OT recommended discharge to SNF. Follow-up with neurologist for further recommendations. Lipid panel: Total cholesterol 151, triglycerides 56, HDL 83, LDL 57. Hemoglobin A1c is 5.2.  Discontinue CBG and NovoLog sliding scale.   Chronic systolic and diastolic CHF: Compensated. 2D echo in May 2023 showed EF estimated at 30 to 35%, grade 1 diastolic dysfunction, mild to moderate MR   ESRD on hemodialysis: She last had dialysis on 08/04/2023 prior to admission.  Plan for  hemodialysis tomorrow.  Follow-up with nephrologist.   CAD, PAD: Recent percutaneous transluminal angioplasty of right peroneal artery and tibioperoneal trunk and right distal SFA and proximal popliteal artery on 07/23/2023. Continue aspirin and Plavix   Closed C3 vertebral fracture: Consulted by Dr. Katrinka Blazing, neurosurgeon to discontinue cervical collar on 07/25/2023   Chronic neck pain, DISH arthritis, chronic anemia   Social issues: Patient says she no longer wants Tammy,  caregiver, to be involved in her care and does not want her to be HPOA.  She wants [name], son, to be HPOA. I reached out to [name], son via phone, to discuss patient's concerns. [name] said he is at work but he will try and get here "early this week".  He said her mom "gave POA to a caregiver" and "it's a long story".  Concerns discussed with Stanton Kidney, RN, Marchelle Folks, unit charge nurse, Cala Bradford, Water quality scientist and Darrian, Child psychotherapist, via Fish farm manager.  Currently, patient has capacity to make decisions for herself.  If she feels threatened by Tammy, then Tammy should not  be allowed to visit.    Diet Order             Diet heart healthy/carb modified Room service appropriate? Yes; Fluid consistency: Thin  Diet effective now                            Consultants: Neurologist Nephrologist  Procedures: None    Medications:    aspirin EC  81 mg Oral Daily   atorvastatin  40 mg Oral Daily   Chlorhexidine Gluconate Cloth  6 each  Topical Daily   clopidogrel  75 mg Oral Daily   ferrous sulfate  325 mg Oral Q breakfast   heparin injection (subcutaneous)  5,000 Units Subcutaneous Q8H   midodrine  2.5 mg Oral TID WC   sevelamer carbonate  800 mg Oral TID WC   Continuous Infusions:   Anti-infectives (From admission, onward)    None              Family Communication/Anticipated D/C date and plan/Code Status   DVT prophylaxis: heparin injection 5,000 Units Start: 08/06/23 2200     Code  Status: Full Code  Family Communication: None Disposition Plan: Plan to discharge to SNF   GEN: NAD SKIN: Warm and dry EYES: No pallor or icterus ENT: MMM CV: RRR PULM: CTA B ABD: soft, ND, NT, +BS CNS: AAO x 3, non focal EXT: No edema or tenderness        Subjective:   Interval events noted.  Weakness in the left upper extremity is a little better and she is still quite weak in the left lower extremity.  Patient said she does not want Tammy, caregiver, to be given any information.  She said she wants her son [name], to be POA.  She said Tammy does not really care for her  Objective:    Vitals:   08/05/23 2036 08/06/23 0512 08/06/23 0803 08/06/23 0920  BP: (!) 121/53 (!) 134/59 139/71 138/74  Pulse: 71 80 82 80  Resp: 19 20 18 18   Temp: 97.9 F (36.6 C) 97.8 F (36.6 C) 97.6 F (36.4 C) (!) 97.5 F (36.4 C)  TempSrc:   Oral   SpO2: 99% 100% 99% 99%  Weight:      Height:       No data found.  No intake or output data in the 24 hours ending 08/06/23 1438 Filed Weights   08/04/23 1001  Weight: 73 kg    Exam:   GEN: NAD SKIN: Warm and dry EYES: No pallor or icterus ENT: MMM CV: RRR PULM: CTA B ABD: soft, ND, NT, +BS CNS: AAO x 3, non focal EXT: No edema or tenderness      Data Reviewed:   I have personally reviewed following labs and imaging studies:  Labs: Labs show the following:   Basic Metabolic Panel: Recent Labs  Lab 08/04/23 1020 08/05/23 0907  NA 136 133*  K 3.7 4.1  CL 95* 93*  CO2 29 27  GLUCOSE 145* 99  BUN 17 36*  CREATININE 3.19* 4.88*  CALCIUM 9.0 9.0   GFR Estimated Creatinine Clearance: 12.2 mL/min (A) (by C-G formula based on SCr of 4.88 mg/dL (H)). Liver Function Tests: No results for input(s): "AST", "ALT", "ALKPHOS", "BILITOT", "PROT", "ALBUMIN" in the last 168 hours. No results for input(s): "LIPASE", "AMYLASE" in the last 168 hours. No results for input(s): "AMMONIA" in the last 168 hours. Coagulation  profile No results for input(s): "INR", "PROTIME" in the last 168 hours.  CBC: Recent Labs  Lab 08/04/23 1020 08/05/23 0907  WBC 10.1 7.1  NEUTROABS  --  4.2  HGB 11.3* 10.3*  HCT 35.3* 31.7*  MCV 85.9 84.1  PLT 283 258   Cardiac Enzymes: No results for input(s): "CKTOTAL", "CKMB", "CKMBINDEX", "TROPONINI" in the last 168 hours. BNP (last 3 results) No results for input(s): "PROBNP" in the last 8760 hours. CBG: Recent Labs  Lab 08/05/23 0747 08/05/23 1106 08/05/23 1628 08/05/23 2034 08/06/23 0806  GLUCAP 102* 94 100* 115* 85   D-Dimer:  No results for input(s): "DDIMER" in the last 72 hours. Hgb A1c: Recent Labs    08/05/23 0907  HGBA1C 5.2   Lipid Profile: Recent Labs    08/05/23 0907  CHOL 151  HDL 83  LDLCALC 57  TRIG 56  CHOLHDL 1.8   Thyroid function studies: No results for input(s): "TSH", "T4TOTAL", "T3FREE", "THYROIDAB" in the last 72 hours.  Invalid input(s): "FREET3" Anemia work up: No results for input(s): "VITAMINB12", "FOLATE", "FERRITIN", "TIBC", "IRON", "RETICCTPCT" in the last 72 hours. Sepsis Labs: Recent Labs  Lab 08/04/23 1020 08/05/23 0907  WBC 10.1 7.1    Microbiology No results found for this or any previous visit (from the past 240 hour(s)).  Procedures and diagnostic studies:  US Carotid Bilateral  Result Date: 08/06/2023 CLINICAL DATA:  72 year old female with a history of stroke EXAM: BILATERAL CAROTID DUPLEX ULTRASOUND TECHNIQUE: Wallace Cullens scale imaging, color Doppler and duplex ultrasound were performed of bilateral carotid and vertebral arteries in the neck. COMPARISON:  None Available. FINDINGS: Criteria: Quantification of carotid stenosis is based on velocity parameters that correlate the residual internal carotid diameter with NASCET-based stenosis levels, using the diameter of the distal internal carotid lumen as the denominator for stenosis measurement. The following velocity measurements were obtained: RIGHT ICA:   Systolic 97 cm/sec, Diastolic 28 cm/sec CCA:  93 cm/sec SYSTOLIC ICA/CCA RATIO:  1.0 ECA:  159 cm/sec LEFT ICA:  Systolic 130 cm/sec, Diastolic 19 cm/sec CCA:  100 cm/sec SYSTOLIC ICA/CCA RATIO:  1.3 ECA:  129 cm/sec Right Brachial SBP: Not acquired Left Brachial SBP: Not acquired RIGHT CAROTID ARTERY: No significant calcifications of the right common carotid artery. Intermediate waveform maintained. Moderate heterogeneous and partially calcified plaque at the right carotid bifurcation. Lumen shadowing at the bifurcation, with the maximal velocity obtained distal to the bifurcation. Low resistance waveform of the right ICA. No significant tortuosity. RIGHT VERTEBRAL ARTERY: Antegrade flow with low resistance waveform. LEFT CAROTID ARTERY: No significant calcifications of the left common carotid artery. Intermediate waveform maintained. Moderate heterogeneous and partially calcified plaque at the left carotid bifurcation. Shadowing present at the bifurcation. Low resistance waveform of the left ICA. No significant tortuosity. LEFT VERTEBRAL ARTERY:  Antegrade flow with low resistance waveform. IMPRESSION: Right: Color duplex indicates moderate heterogeneous and calcified plaque, with no hemodynamically significant stenosis by duplex criteria in the extracranial cerebrovascular circulation. Note that flow velocities of the right ICA were obtained from an area distal to the maximum narrowing due to the presence of anterior wall plaque with shadowing and may be underestimating the percentage of ICA stenosis. If establishing a more accurate degree of stenosis is required, cerebral angiogram should be considered, or as a second best test, CTA. Left: Color duplex indicates moderate heterogeneous and calcified plaque, with no hemodynamically significant stenosis by duplex criteria in the extracranial cerebrovascular circulation. Signed, Yvone Neu. Miachel Roux, RPVI [facility]  [facility] Radiology Electronically Signed   By: Gilmer Mor D.O.   On: 08/06/2023 12:33   CT ANGIO HEAD NECK W WO CM  Result Date: 08/05/2023 CLINICAL DATA:  Neuro deficit, acute, stroke suspected EXAM: CT ANGIOGRAPHY HEAD AND NECK WITH AND WITHOUT CONTRAST TECHNIQUE: Multidetector CT imaging of the head and neck was performed using the standard protocol during bolus administration of intravenous contrast. Multiplanar CT image reconstructions and MIPs were obtained to evaluate the vascular anatomy. Carotid stenosis measurements (when applicable) are obtained utilizing NASCET criteria, using the distal internal carotid diameter as the denominator. RADIATION DOSE REDUCTION:  This exam was performed according to the departmental dose-optimization program which includes automated exposure control, adjustment of the mA and/or kV according to patient size and/or use of iterative reconstruction technique. CONTRAST:  75mL OMNIPAQUE IOHEXOL 350 MG/ML SOLN COMPARISON:  Brain MR 08/04/23 FINDINGS: CT HEAD FINDINGS Brain: No hemorrhage. No hydrocephalus. No extra-axial fluid collection. No mass effect. No mass lesion. Chronic infarcts in the left caudate head. There is background of mild chronic microvascular ischemic change. Vascular: No hyperdense vessel or unexpected calcification. Skull: Normal. Negative for fracture or focal lesion. Sinuses/Orbits: No middle ear or mastoid effusion. Paranasal sinuses are clear. Bilateral lens replacement. Orbits are otherwise unremarkable. Other: There is anterior subluxation of the mandibular heads bilaterally without dislocation Review of the MIP images confirms the above findings CTA NECK FINDINGS Limitations: Motion degraded exam particularly at the level of the carotid bifurcation. Detailed assessment for the degree of vessel stenosis is limited at this level. Aortic arch: Standard branching. Imaged portion shows no evidence of aneurysm or dissection. No significant stenosis of  the major arch vessel origins. Right carotid system: No evidence of dissection, stenosis (50% or greater), or occlusion. Left carotid system: No evidence of dissection, stenosis (50% or greater), or occlusion. Vertebral arteries: Codominant. No evidence of dissection, stenosis (50% or greater), or occlusion. Skeleton: There are osseous findings of DISH multilevel anterior bridging osteophytes. There is likely at least moderate spinal canal narrowing C5-C6 and C6-C7 Other neck: Right-sided central venous catheter in place. Upper chest: Patulous and fluid-filled esophagus. Review of the MIP images confirms the above findings CTA HEAD FINDINGS Anterior circulation: Mild narrowing of the supraclinoid ICA on the right (series 12, image 123) no proximal occlusion, aneurysm, or vascular malformation. Posterior circulation: No significant stenosis, proximal occlusion, aneurysm, or vascular malformation. Venous sinuses: As permitted by contrast timing, patent. Anatomic variants: None Review of the MIP images confirms the above findings IMPRESSION: 1. No acute intracranial abnormality. Chronic infarcts in the left caudate head. Infarcts seen on recent brain MRI are not visualized due to CT technique. 2. No intracranial large vessel occlusion. Mild narrowing of the supraclinoid ICA on the right. 3. Note that assessment of the neck vasculature is markedly limited due to the degree of motion artifact. Within this limitation, no definite evidence of an alveolar or hemodynamically significant stenosis. 4. Patulous and fluid-filled esophagus, which could predispose to aspiration. 5. Osseous findings of DISH with likely at least moderate spinal canal narrowing at C5-C6 and C6-C7. Electronically Signed   By: Lorenza Cambridge M.D.   On: 08/05/2023 16:24               LOS: 1 day   Lestat Golob  [facility]   Pager on [email or web address]. If 7PM-7AM, please contact night-coverage at [email or web address]     08/06/2023,  2:38 PM

## 2023-08-06 NOTE — Progress Notes (Signed)
Patient transferred to room 247 via bed, by nurse and tech in stable condition.

## 2023-08-06 NOTE — NC FL2 (Signed)
[place] MEDICAID FL2 LEVEL OF CARE FORM     IDENTIFICATION  Patient Name: Deanna [name] Birthdate: 1951/02/25 Sex: female Admission Date (Current Location): 08/04/2023  [facility] and [place] Number:  Chiropodist and Address:  [facility], [address] [place], [place] [address]      Provider Number: [id number]  Attending Physician Name and Address:  Lurene Shadow, MD  Relative Name and Phone Number:       Current Level of Care: Hospital Recommended Level of Care: Skilled Nursing Facility Prior Approval Number:    Date Approved/Denied:   PASRR Number: [id number]  Discharge Plan: SNF    Current Diagnoses: Patient Active Problem List   Diagnosis Date Noted   PAD (peripheral artery disease) (HCC) 08/04/2023   Type II diabetes mellitus with renal manifestations (HCC) 05/18/2023   Fall 05/17/2023   Anemia in ESRD (end-stage renal disease) (HCC) 05/17/2023   CAD (coronary artery disease) 05/17/2023   Leukocytosis 05/17/2023   Closed nondisplaced fracture of third cervical vertebra (HCC) 05/01/2023   Shortness of breath 01/21/2022   Severe sepsis (HCC) 01/20/2022   SOB (shortness of breath) 01/20/2022   Chest pain 01/20/2022   Hypoglycemia associated with diabetes (HCC) 01/20/2022   End-stage renal disease on hemodialysis (HCC) 01/20/2022   Aspiration pneumonia (HCC) 01/20/2022   Acute respiratory failure with hypoxia and hypercapnia (HCC) 01/19/2022   Angioedema 01/14/2022   Elevated troponin 01/08/2022   Hyperkalemia 01/08/2022   Acute right flank pain    Weakness of right lower extremity    Right sided numbness    CVA (cerebral vascular accident) (HCC) 12/28/2021   Hypokalemia 12/27/2021   Acute kidney injury superimposed on chronic kidney disease (HCC) 12/27/2021   Anemia 12/27/2021   Acute CVA (cerebrovascular accident) (HCC) 12/27/2021   Lobar pneumonia (HCC) 12/27/2021   Right sided weakness 12/26/2021   HFrEF  (heart failure with reduced ejection fraction) (HCC) 10/31/2021   Angina of effort (HCC) 03/01/2016   Essential (primary) hypertension 06/24/2014   Type 2 diabetes mellitus without complications (HCC) 06/24/2014    Orientation RESPIRATION BLADDER Height & Weight     Time, Self, Situation, Place  Normal Continent Weight: 160 lb 15 oz (73 kg) Height:  6\' 1"  (185.4 cm)  BEHAVIORAL SYMPTOMS/MOOD NEUROLOGICAL BOWEL NUTRITION STATUS      Continent    AMBULATORY STATUS COMMUNICATION OF NEEDS Skin   Limited Assist Verbally Normal                       Personal Care Assistance Level of Assistance  Bathing, Feeding, Dressing Bathing Assistance: Limited assistance Feeding assistance: Limited assistance Dressing Assistance: Limited assistance     Functional Limitations Info  Sight, Hearing, Speech Sight Info: Adequate Hearing Info: Adequate Speech Info: Adequate    SPECIAL CARE FACTORS FREQUENCY  PT (By licensed PT), OT (By licensed OT)     PT Frequency: 5 times a week OT Frequency: 5 times a week            Contractures Contractures Info: Not present    Additional Factors Info  Code Status, Allergies Code Status Info: FULL Allergies Info: Ace Inhibitors  Entresto (Sacubitril-valsartan)  Lisinopril  Penicillins  Tomato           Current Medications (08/06/2023):  This is the current hospital active medication list Current Facility-Administered Medications  Medication Dose Route Frequency Provider Last Rate Last Admin   acetaminophen (TYLENOL) tablet 650 mg  650 mg Oral  Q4H PRN Jaynie Bream, RPH       Or   acetaminophen (TYLENOL) 160 MG/5ML solution 650 mg  650 mg Per Tube Q4H PRN Jaynie Bream, RPH       Or   acetaminophen (TYLENOL) suppository 650 mg  650 mg Rectal Q4H PRN Jaynie Bream, RPH       aspirin EC tablet 81 mg  81 mg Oral Daily Verdene Lennert, MD   81 mg at 08/06/23 9562   atorvastatin (LIPITOR) tablet 40 mg  40 mg Oral Daily Lurene Shadow, MD   40 mg at 08/06/23 1308   Chlorhexidine Gluconate Cloth 2 % PADS 6 each  6 each Topical Daily Verdene Lennert, MD   6 each at 08/06/23 1042   clopidogrel (PLAVIX) tablet 75 mg  75 mg Oral Daily Verdene Lennert, MD   75 mg at 08/06/23 6578   fentaNYL (SUBLIMAZE) injection 12.5 mcg  12.5 mcg Intravenous Q5 Min x 2 PRN Bhagat, Srishti L, MD       ferrous sulfate tablet 325 mg  325 mg Oral Q breakfast Lurene Shadow, MD   325 mg at 08/06/23 0921   heparin injection 5,000 Units  5,000 Units Subcutaneous Q8H Lurene Shadow, MD       midodrine (PROAMATINE) tablet 2.5 mg  2.5 mg Oral TID WC Verdene Lennert, MD   2.5 mg at 08/06/23 1236   ondansetron (ZOFRAN) injection 4 mg  4 mg Intravenous Q6H PRN Verdene Lennert, MD   4 mg at 08/04/23 1827   oxyCODONE-acetaminophen (PERCOCET/ROXICET) 5-325 MG per tablet 1 tablet  1 tablet Oral Q6H PRN Lurene Shadow, MD   1 tablet at 08/06/23 0205   senna-docusate (Senokot-S) tablet 1 tablet  1 tablet Oral QHS PRN Verdene Lennert, MD       sevelamer carbonate (RENVELA) tablet 800 mg  800 mg Oral TID WC Verdene Lennert, MD   800 mg at 08/06/23 1236     Discharge Medications: Please see discharge summary for a list of discharge medications.  Relevant Imaging Results:  Relevant Lab Results:   Additional Information SSN [id number]  Dialysis patient  Allena Katz, LCSW

## 2023-08-06 NOTE — Plan of Care (Signed)
  Problem: Coping: Goal: Ability to adjust to condition or change in health will improve Outcome: Progressing   
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
## 2023-08-06 NOTE — Progress Notes (Signed)
Patient arrived via bed with Nurse Aida from 1c. Patient brought with no personal items.  Patient denies pain or discomfort at this time. Safety maintained.

## 2023-08-06 NOTE — Progress Notes (Signed)
Pt was very upset about having her CBG and Neuro checks. She said her blood sugars were done every 15 min. And the Neuro checks make her feel like she is in the 5th grade. She does not want to do it. She said she just wants to get some sleep. I will attempt to do her next Neuro check at 0400.

## 2023-08-06 NOTE — Progress Notes (Signed)
Physical Therapy Treatment Patient Details Name: Deanna [name] MRN: [id number] DOB: [birth year] Today's Date: 08/06/2023   History of Present Illness Pt is a 72 year old female presenting to ED after fall, imaging showing no acute injury    PMH significant for  ESRD-HD (TTS), HTN, H LD, DM, CAD, sCHF with EF 30%, stroke, anemia, recent C3 cervical fracture on C-collar, angioedema due to ACEI    PT Comments  Pt seen for PT tx with pt agreeable. Pt perseverative of issues with her caregiver, noting she lives with her caregiver in the caregiver's home. Pt reports prior to admission she was ambulatory with SPC, bathing & dressing on her own. On this date, pt attempts gait with SPC in RUE but reaching for support with LUE. PT educated pt on & provided pt with RW & pt able to ambulate with improved balance but still impaired quality of gait as noted below. Pt reports her caregiver offers very little help at baseline & at this time, pt requires more assistance for mobility tasks. Will continue to follow pt acutely to address gait with LRAD, balance, strengthening & safety with mobility. Recommend post acute rehab <3 hours therapy/day to maximize independence & reduce fall risk prior to return home.    If plan is discharge home, recommend the following: Assistance with cooking/housework;Assistance with feeding;Direct supervision/assist for medications management;Direct supervision/assist for financial management;Assist for transportation;Help with stairs or ramp for entrance;A little help with walking and/or transfers;A little help with bathing/dressing/bathroom   Can travel by private vehicle     Yes  Equipment Recommendations  Rolling walker (2 wheels)    Recommendations for Other Services       Precautions / Restrictions Precautions Precautions: Fall Precaution Comments: L hemi Restrictions Weight Bearing Restrictions: No     Mobility  Bed Mobility Overal bed mobility: Needs  Assistance Bed Mobility: Supine to Sit     Supine to sit: HOB elevated, Supervision, Used rails          Transfers Overall transfer level: Needs assistance Equipment used: Straight cane, Rolling walker (2 wheels) Transfers: Sit to/from Stand Sit to Stand: Contact guard assist           General transfer comment: STS from EOB, toilet wtih grab bar    Ambulation/Gait Ambulation/Gait assistance: Contact guard assist Gait Distance (Feet): 12 Feet (+ 100 ft) Assistive device: Rolling walker (2 wheels) Gait Pattern/deviations: Trunk flexed Gait velocity: decreased     General Gait Details: decreased L hip/knee flexion during swing phase, decreased dorsiflexion LLE, decreased heel strike LLE, cuing for upright vs significantly flexed posture but poor return Risk manager     Tilt Bed    Modified Rankin (Stroke Patients Only)       Balance Overall balance assessment: Needs assistance Sitting-balance support: No upper extremity supported, Bilateral upper extremity supported, Feet supported Sitting balance-[name]: Fair     Standing balance support: During functional activity, Bilateral upper extremity supported, Reliant on assistive device for balance Standing balance-[name]: Fair                              Cognition Arousal: Alert Behavior During Therapy: WFL for tasks assessed/performed Overall Cognitive Status: Within Functional Limits for tasks assessed  General Comments: slightly decreased safety awareness, education re: optimal safety with mobility        Exercises      General Comments General comments (skin integrity, edema, etc.): Pt with continent void on toilet.      Pertinent Vitals/Pain Pain Assessment Pain Assessment: No/denies pain (none at this time but pt notes intermittent L sided neck pain)    Home Living                           Prior Function            PT Goals (current goals can now be found in the care plan section) Acute Rehab PT Goals Patient Stated Goal: I want to walk and get my strength back in my left arm and leg PT Goal Formulation: With patient Time For Goal Achievement: 08/19/23 Potential to Achieve Goals: Good Progress towards PT goals: Progressing toward goals    Frequency    Min 1X/week (update 2/2 delivery of care model)      PT Plan      Co-evaluation PT/OT/SLP Co-Evaluation/Treatment: Yes Reason for Co-Treatment: For patient/therapist safety (unsure of pt's need for assistance as pt was max/total assist during last PT session) PT goals addressed during session: Mobility/safety with mobility;Balance;Proper use of DME        AM-PAC PT "6 Clicks" Mobility   Outcome Measure  Help needed turning from your back to your side while in a flat bed without using bedrails?: A Little Help needed moving from lying on your back to sitting on the side of a flat bed without using bedrails?: A Little Help needed moving to and from a bed to a chair (including a wheelchair)?: A Little Help needed standing up from a chair using your arms (e.g., wheelchair or bedside chair)?: A Little Help needed to walk in hospital room?: A Little Help needed climbing 3-5 steps with a railing? : A Lot 6 Click Score: 17    End of Session Equipment Utilized During Treatment: Gait belt Activity Tolerance: Patient tolerated treatment well Patient left: in chair (in care of OT, nurse)   PT Visit Diagnosis: Unsteadiness on feet (R26.81);Other abnormalities of gait and mobility (R26.89);Muscle weakness (generalized) (M62.81);Difficulty in walking, not elsewhere classified (R26.2)     Time: 0981-1914 PT Time Calculation (min) (ACUTE ONLY): 21 min  Charges:    $Therapeutic Activity: 8-22 mins PT General Charges $$ ACUTE PT VISIT: 1 Visit                     Aleda Grana, PT, DPT 08/06/23,  9:34 AM   Sandi Mariscal 08/06/2023, 9:32 AM

## 2023-08-06 NOTE — Progress Notes (Signed)
[facility]  ROUNDING NOTE   Subjective:   Deanna [name]  is a 72 year old African-American female with a past medical history of coronary artery disease, status post PCI, CHF with reduced ejection fraction, CKD, diabetes mellitus, hypertension, and end stage renal disease on hemodialysis. Patient presents to the ED from her dialysis center with left arm pain and weakness.  Patient has been admitted for Acute CVA (cerebrovascular accident) [facility]) [I63.9] Cerebrovascular accident (CVA), unspecified mechanism (HCC) [I63.9] CVA (cerebral vascular accident) [facility]) [I63.9]  Patient is known to our practice and receives outpatient dialysis treatments at [facility] on a TTS schedule, supervised by Dr. Cherylann Ratel.   Patient seen resting quietly, sitting in chair States she feels well Continues to have left-sided weakness   Objective:  Vital signs in last 24 hours:  Temp:  [97.5 F (36.4 C)-97.9 F (36.6 C)] 97.5 F (36.4 C) (11/11 0920) Pulse Rate:  [71-82] 80 (11/11 0920) Resp:  [18-20] 18 (11/11 0920) BP: (121-139)/(53-74) 138/74 (11/11 0920) SpO2:  [99 %-100 %] 99 % (11/11 0920)  Weight change:  Filed Weights   08/04/23 1001  Weight: 73 kg    Intake/Output: No intake/output data recorded.   Intake/Output this shift:  No intake/output data recorded.  Physical Exam: General: NAD  Head: Normocephalic, atraumatic. Moist oral mucosal membranes  Eyes: Anicteric  Lungs:  Clear to auscultation, room air  Heart: Regular rate and rhythm  Abdomen:  Soft, nontender  Extremities: Trace peripheral edema.  Neurologic: Alert, moving all four extremities  Skin: No lesions  Access: Rt permcath, AVG placed on 05/17/23     Basic Metabolic Panel: Recent Labs  Lab 08/04/23 1020 08/05/23 0907  NA 136 133*  K 3.7 4.1  CL 95* 93*  CO2 29 27  GLUCOSE 145* 99  BUN 17 36*  CREATININE 3.19* 4.88*  CALCIUM 9.0 9.0    Liver Function Tests: No results for input(s):  "AST", "ALT", "ALKPHOS", "BILITOT", "PROT", "ALBUMIN" in the last 168 hours. No results for input(s): "LIPASE", "AMYLASE" in the last 168 hours. No results for input(s): "AMMONIA" in the last 168 hours.  CBC: Recent Labs  Lab 08/04/23 1020 08/05/23 0907  WBC 10.1 7.1  NEUTROABS  --  4.2  HGB 11.3* 10.3*  HCT 35.3* 31.7*  MCV 85.9 84.1  PLT 283 258    Cardiac Enzymes: No results for input(s): "CKTOTAL", "CKMB", "CKMBINDEX", "TROPONINI" in the last 168 hours.  BNP: Invalid input(s): "POCBNP"  CBG: Recent Labs  Lab 08/05/23 0747 08/05/23 1106 08/05/23 1628 08/05/23 2034 08/06/23 0806  GLUCAP 102* 94 100* 115* 85    Microbiology: Results for orders placed or performed during the hospital encounter of 01/20/22  Blood culture (routine x 2)     Status: None   Collection Time: 01/20/22  7:22 PM   Specimen: BLOOD  Result Value Ref Range Status   Specimen Description BLOOD BLOOD RIGHT HAND  Final   Special Requests   Final    BOTTLES DRAWN AEROBIC AND ANAEROBIC Blood Culture results may not be optimal due to an inadequate volume of blood received in culture bottles   Culture   Final    NO GROWTH 5 DAYS Performed at [facility], [address] [place], [place] [address]    Report Status 01/25/2022 FINAL  Final  Blood culture (routine x 2)     Status: None   Collection Time: 01/20/22  7:22 PM   Specimen: BLOOD  Result Value Ref Range Status   Specimen  Description BLOOD BLOOD LEFT HAND  Final   Special Requests   Final    BOTTLES DRAWN AEROBIC AND ANAEROBIC Blood Culture results may not be optimal due to an inadequate volume of blood received in culture bottles   Culture   Final    NO GROWTH 5 DAYS Performed at [facility], [address] [place], [place] [address]    Report Status 01/25/2022 FINAL  Final  Resp Panel by RT-PCR (Flu A&B, Covid) Nasopharyngeal Swab     Status: None   Collection Time: 01/20/22  7:28 PM   Specimen: Nasopharyngeal  Swab; Nasopharyngeal(NP) swabs in vial transport medium  Result Value Ref Range Status   SARS Coronavirus 2 by RT PCR NEGATIVE NEGATIVE Final    Comment: (NOTE) SARS-CoV-2 target nucleic acids are NOT DETECTED.  The SARS-CoV-2 RNA is generally detectable in upper respiratory specimens during the acute phase of infection. The lowest concentration of SARS-CoV-2 viral copies this assay can detect is 138 copies/mL. A negative result does not preclude SARS-Cov-2 infection and should not be used as the sole basis for treatment or other patient management decisions. A negative result may occur with  improper specimen collection/handling, submission of specimen other than nasopharyngeal swab, presence of viral mutation(s) within the areas targeted by this assay, and inadequate number of viral copies(<138 copies/mL). A negative result must be combined with clinical observations, patient history, and epidemiological information. The expected result is Negative.  Fact Sheet for Patients:  BloggerCourse.com  Fact Sheet for Healthcare Providers:  SeriousBroker.it  This test is no t yet approved or cleared by the [place] FDA and  has been authorized for detection and/or diagnosis of SARS-CoV-2 by FDA under an Emergency Use Authorization (EUA). This EUA will remain  in effect (meaning this test can be used) for the duration of the COVID-19 declaration under Section 564(b)(1) of the Act, 21 U.S.C.section 360bbb-3(b)(1), unless the authorization is terminated  or revoked sooner.       Influenza A by PCR NEGATIVE NEGATIVE Final   Influenza B by PCR NEGATIVE NEGATIVE Final    Comment: (NOTE) The Xpert Xpress SARS-CoV-2/FLU/RSV plus assay is intended as an aid in the diagnosis of influenza from Nasopharyngeal swab specimens and should not be used as a sole basis for treatment. Nasal washings and aspirates are unacceptable for Xpert Xpress  SARS-CoV-2/FLU/RSV testing.  Fact Sheet for Patients: BloggerCourse.com  Fact Sheet for Healthcare Providers: SeriousBroker.it  This test is not yet approved or cleared by the [place] FDA and has been authorized for detection and/or diagnosis of SARS-CoV-2 by FDA under an Emergency Use Authorization (EUA). This EUA will remain in effect (meaning this test can be used) for the duration of the COVID-19 declaration under Section 564(b)(1) of the Act, 21 U.S.C. section 360bbb-3(b)(1), unless the authorization is terminated or revoked.  Performed at [facility], [address] [place], [place] [address]     Coagulation Studies: No results for input(s): "LABPROT", "INR" in the last 72 hours.  Urinalysis: No results for input(s): "COLORURINE", "LABSPEC", "PHURINE", "GLUCOSEU", "HGBUR", "BILIRUBINUR", "KETONESUR", "PROTEINUR", "UROBILINOGEN", "NITRITE", "LEUKOCYTESUR" in the last 72 hours.  Invalid input(s): "APPERANCEUR"    Imaging: US Carotid Bilateral  Result Date: 08/06/2023 CLINICAL DATA:  72 year old female with a history of stroke EXAM: BILATERAL CAROTID DUPLEX ULTRASOUND TECHNIQUE: Wallace Cullens scale imaging, color Doppler and duplex ultrasound were performed of bilateral carotid and vertebral arteries in the neck. COMPARISON:  None Available. FINDINGS: Criteria: Quantification of carotid stenosis is based  on velocity parameters that correlate the residual internal carotid diameter with NASCET-based stenosis levels, using the diameter of the distal internal carotid lumen as the denominator for stenosis measurement. The following velocity measurements were obtained: RIGHT ICA:  Systolic 97 cm/sec, Diastolic 28 cm/sec CCA:  93 cm/sec SYSTOLIC ICA/CCA RATIO:  1.0 ECA:  159 cm/sec LEFT ICA:  Systolic 130 cm/sec, Diastolic 19 cm/sec CCA:  100 cm/sec SYSTOLIC ICA/CCA RATIO:  1.3 ECA:  129 cm/sec Right Brachial SBP: Not acquired  Left Brachial SBP: Not acquired RIGHT CAROTID ARTERY: No significant calcifications of the right common carotid artery. Intermediate waveform maintained. Moderate heterogeneous and partially calcified plaque at the right carotid bifurcation. Lumen shadowing at the bifurcation, with the maximal velocity obtained distal to the bifurcation. Low resistance waveform of the right ICA. No significant tortuosity. RIGHT VERTEBRAL ARTERY: Antegrade flow with low resistance waveform. LEFT CAROTID ARTERY: No significant calcifications of the left common carotid artery. Intermediate waveform maintained. Moderate heterogeneous and partially calcified plaque at the left carotid bifurcation. Shadowing present at the bifurcation. Low resistance waveform of the left ICA. No significant tortuosity. LEFT VERTEBRAL ARTERY:  Antegrade flow with low resistance waveform. IMPRESSION: Right: Color duplex indicates moderate heterogeneous and calcified plaque, with no hemodynamically significant stenosis by duplex criteria in the extracranial cerebrovascular circulation. Note that flow velocities of the right ICA were obtained from an area distal to the maximum narrowing due to the presence of anterior wall plaque with shadowing and may be underestimating the percentage of ICA stenosis. If establishing a more accurate degree of stenosis is required, cerebral angiogram should be considered, or as a second best test, CTA. Left: Color duplex indicates moderate heterogeneous and calcified plaque, with no hemodynamically significant stenosis by duplex criteria in the extracranial cerebrovascular circulation. Signed, Yvone Neu. Miachel Roux, RPVI [facility] Radiology Electronically Signed   By: Gilmer Mor D.O.   On: 08/06/2023 12:33   CT ANGIO HEAD NECK W WO CM  Result Date: 08/05/2023 CLINICAL DATA:  Neuro deficit, acute, stroke suspected EXAM: CT ANGIOGRAPHY HEAD AND NECK WITH AND WITHOUT  CONTRAST TECHNIQUE: Multidetector CT imaging of the head and neck was performed using the standard protocol during bolus administration of intravenous contrast. Multiplanar CT image reconstructions and MIPs were obtained to evaluate the vascular anatomy. Carotid stenosis measurements (when applicable) are obtained utilizing NASCET criteria, using the distal internal carotid diameter as the denominator. RADIATION DOSE REDUCTION: This exam was performed according to the departmental dose-optimization program which includes automated exposure control, adjustment of the mA and/or kV according to patient size and/or use of iterative reconstruction technique. CONTRAST:  75mL OMNIPAQUE IOHEXOL 350 MG/ML SOLN COMPARISON:  Brain MR 08/04/23 FINDINGS: CT HEAD FINDINGS Brain: No hemorrhage. No hydrocephalus. No extra-axial fluid collection. No mass effect. No mass lesion. Chronic infarcts in the left caudate head. There is background of mild chronic microvascular ischemic change. Vascular: No hyperdense vessel or unexpected calcification. Skull: Normal. Negative for fracture or focal lesion. Sinuses/Orbits: No middle ear or mastoid effusion. Paranasal sinuses are clear. Bilateral lens replacement. Orbits are otherwise unremarkable. Other: There is anterior subluxation of the mandibular heads bilaterally without dislocation Review of the MIP images confirms the above findings CTA NECK FINDINGS Limitations: Motion degraded exam particularly at the level of the carotid bifurcation. Detailed assessment for the degree of vessel stenosis is limited at this level. Aortic arch: Standard branching. Imaged portion shows no evidence of aneurysm or dissection. No significant stenosis of the major  arch vessel origins. Right carotid system: No evidence of dissection, stenosis (50% or greater), or occlusion. Left carotid system: No evidence of dissection, stenosis (50% or greater), or occlusion. Vertebral arteries: Codominant. No evidence of  dissection, stenosis (50% or greater), or occlusion. Skeleton: There are osseous findings of DISH multilevel anterior bridging osteophytes. There is likely at least moderate spinal canal narrowing C5-C6 and C6-C7 Other neck: Right-sided central venous catheter in place. Upper chest: Patulous and fluid-filled esophagus. Review of the MIP images confirms the above findings CTA HEAD FINDINGS Anterior circulation: Mild narrowing of the supraclinoid ICA on the right (series 12, image 123) no proximal occlusion, aneurysm, or vascular malformation. Posterior circulation: No significant stenosis, proximal occlusion, aneurysm, or vascular malformation. Venous sinuses: As permitted by contrast timing, patent. Anatomic variants: None Review of the MIP images confirms the above findings IMPRESSION: 1. No acute intracranial abnormality. Chronic infarcts in the left caudate head. Infarcts seen on recent brain MRI are not visualized due to CT technique. 2. No intracranial large vessel occlusion. Mild narrowing of the supraclinoid ICA on the right. 3. Note that assessment of the neck vasculature is markedly limited due to the degree of motion artifact. Within this limitation, no definite evidence of an alveolar or hemodynamically significant stenosis. 4. Patulous and fluid-filled esophagus, which could predispose to aspiration. 5. Osseous findings of DISH with likely at least moderate spinal canal narrowing at C5-C6 and C6-C7. Electronically Signed   By: Lorenza Cambridge M.D.   On: 08/05/2023 16:24   MR BRAIN WO CONTRAST  Result Date: 08/04/2023 CLINICAL DATA:  Neuro deficit, acute, stroke suspected L sided hemibody numbness. Left upper extremity and lower extremity weakness. Recent cervical fracture; Neck pain, acute, prior cervical surgery L sided weakness EXAM: MRI HEAD WITHOUT CONTRAST MRI CERVICAL SPINE WITHOUT CONTRAST TECHNIQUE: Multiplanar, multiecho pulse sequences of the brain and surrounding structures, and cervical  spine, to include the craniocervical junction and cervicothoracic junction, were obtained without intravenous contrast. COMPARISON:  MR C Spine 05/17/23 FINDINGS: MRI HEAD FINDINGS Brain: There is an acute linear infarct in the right thalamus (series 5, image 20) in the posterior right frontal lobe. No acute hemorrhage. No hydrocephalus. No extra-axial fluid collection. There is a background of moderate chronic microvascular ischemic change. 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 Skull and upper cervical spine: Normal marrow signal. Sinuses/Orbits: Small right mastoid effusion. Paranasal sinuses are clear. Bilateral lens replacement. Orbits are otherwise unremarkable. Other: None. MRI CERVICAL SPINE FINDINGS Alignment: Physiologic. Vertebrae: Redemonstrated osseous findings of DISH. There is T2/STIR hyperintense signal abnormality with the anterior osteophyte at C3-C4 and T1-T2. This signal abnormality at the C3-C4 level likely represents healing posttraumatic changes. Cord: There may be very mild T2 hyperintense signal abnormality cervical spinal cord at the C3-C4 level (series 12, image 10). Posterior Fossa, vertebral arteries, paraspinal tissues: Negative. Disc levels: Limitations: Note that assessment of the degree of spinal canal or neural foraminal narrowing is limited due to the degree of motion artifact. C1-C2: Mild degenerative change. C2-C3: Minimal disc bulge. Mild spinal canal narrowing. No neural foraminal narrowing. Neural foraminal narrowing. C3-C4: Mild bilateral facet degenerative change. Circumferential disc bulge. Moderate spinal canal narrowing. Moderate left and mild right neural foraminal narrowing. Uncovertebral hypertrophy. C4-C5: Mild bilateral facet degenerative change. No significant disc bulge. Mild spinal canal narrowing. Mild right neural foraminal narrowing. C5-C6: Severe bilateral facet degenerative change. Uncovertebral hypertrophy. Circumferential disc  bulge. Moderate spinal canal narrowing. Moderate bilateral neural foraminal narrowing. C6-C7: Moderate bilateral  facet degenerative change. Circumferential disc bulge. Moderate spinal canal narrowing. Moderate left and mild right neural foraminal narrowing. C7-T1: Unremarkable IMPRESSION: 1. 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 No hemorrhage. 2. Redemonstrated osseous findings of DISH. T2/STIR hyperintense signal abnormality with the anterior osteophyte at C3-C4 and T1-T2, likely representing healing posttraumatic changes. 3. There may be very mild T2 hyperintense signal abnormality in the cervical spinal cord at the C3-C4 level, which may be artifactual or represent myelomalacia. 4. Additional degenerative changes as above. Electronically Signed   By: Lorenza Cambridge M.D.   On: 08/04/2023 14:37   MR Cervical Spine Wo Contrast  Result Date: 08/04/2023 CLINICAL DATA:  Neuro deficit, acute, stroke suspected L sided hemibody numbness. Left upper extremity and lower extremity weakness. Recent cervical fracture; Neck pain, acute, prior cervical surgery L sided weakness EXAM: MRI HEAD WITHOUT CONTRAST MRI CERVICAL SPINE WITHOUT CONTRAST TECHNIQUE: Multiplanar, multiecho pulse sequences of the brain and surrounding structures, and cervical spine, to include the craniocervical junction and cervicothoracic junction, were obtained without intravenous contrast. COMPARISON:  MR C Spine 05/17/23 FINDINGS: MRI HEAD FINDINGS Brain: There is an acute linear infarct in the right thalamus (series 5, image 20) in the posterior right frontal lobe. No acute hemorrhage. No hydrocephalus. No extra-axial fluid collection. There is a background of moderate chronic microvascular ischemic change. 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 Skull and upper cervical spine: Normal marrow signal. Sinuses/Orbits: Small right mastoid effusion. Paranasal sinuses are clear. Bilateral  lens replacement. Orbits are otherwise unremarkable. Other: None. MRI CERVICAL SPINE FINDINGS Alignment: Physiologic. Vertebrae: Redemonstrated osseous findings of DISH. There is T2/STIR hyperintense signal abnormality with the anterior osteophyte at C3-C4 and T1-T2. This signal abnormality at the C3-C4 level likely represents healing posttraumatic changes. Cord: There may be very mild T2 hyperintense signal abnormality cervical spinal cord at the C3-C4 level (series 12, image 10). Posterior Fossa, vertebral arteries, paraspinal tissues: Negative. Disc levels: Limitations: Note that assessment of the degree of spinal canal or neural foraminal narrowing is limited due to the degree of motion artifact. C1-C2: Mild degenerative change. C2-C3: Minimal disc bulge. Mild spinal canal narrowing. No neural foraminal narrowing. Neural foraminal narrowing. C3-C4: Mild bilateral facet degenerative change. Circumferential disc bulge. Moderate spinal canal narrowing. Moderate left and mild right neural foraminal narrowing. Uncovertebral hypertrophy. C4-C5: Mild bilateral facet degenerative change. No significant disc bulge. Mild spinal canal narrowing. Mild right neural foraminal narrowing. C5-C6: Severe bilateral facet degenerative change. Uncovertebral hypertrophy. Circumferential disc bulge. Moderate spinal canal narrowing. Moderate bilateral neural foraminal narrowing. C6-C7: Moderate bilateral facet degenerative change. Circumferential disc bulge. Moderate spinal canal narrowing. Moderate left and mild right neural foraminal narrowing. C7-T1: Unremarkable IMPRESSION: 1. 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 No hemorrhage. 2. Redemonstrated osseous findings of DISH. T2/STIR hyperintense signal abnormality with the anterior osteophyte at C3-C4 and T1-T2, likely representing healing posttraumatic changes. 3. There may be very mild T2 hyperintense signal abnormality in the cervical spinal  cord at the C3-C4 level, which may be artifactual or represent myelomalacia. 4. Additional degenerative changes as above. Electronically Signed   By: Lorenza Cambridge M.D.   On: 08/04/2023 14:37     Medications:     aspirin EC  81 mg Oral Daily   atorvastatin  40 mg Oral Daily   Chlorhexidine Gluconate Cloth  6 each Topical Daily   clopidogrel  75 mg Oral Daily   ferrous sulfate  325 mg Oral Q breakfast   heparin injection (subcutaneous)  5,000 Units Subcutaneous Q8H   midodrine  2.5 mg Oral TID WC   sevelamer carbonate  800 mg Oral TID WC   acetaminophen **OR** acetaminophen (TYLENOL) oral liquid 160 mg/5 mL **OR** acetaminophen, ondansetron (ZOFRAN) IV, oxyCODONE-acetaminophen, senna-docusate  Assessment/ Plan:  Ms. Emmakate Simek is a 72 y.o.  female with a past medical history of coronary artery disease, status post PCI, CHF with reduced ejection fraction, CKD, diabetes mellitus, hypertension, and end stage renal disease on hemodialysis. Patient presents to the ED from her dialysis center with left arm pain and weakness.  Patient has been admitted for Acute CVA (cerebrovascular accident) [facility]) [I63.9] Cerebrovascular accident (CVA), unspecified mechanism (HCC) [I63.9] CVA (cerebral vascular accident) (HCC) [I63.9]  [facility]/TTS/Rt AVG/71.0 kg  1.  End-stage renal disease on hemodialysis.  Next treatment scheduled for Tuesday.  2.  Acute stroke.  Brain MRI shows acute lacunar infarct and right numbness and posterior frontal lobe.Currently prescribed aspirin and Plavix.  Experiencing left-sided weakness.  Neurology consulted and further workup in progress.  3. Anemia of chronic kidney disease Lab Results  Component Value Date   HGB 10.3 (L) 08/05/2023    Patient receives Mircera at outpatient clinic.  No need for ESA at this time.  4. Secondary Hyperparathyroidism: with outpatient labs: PTH 776, phosphorus 7.4, calcium 8.1 on 07/10/23.    Lab Results  Component  Value Date   PTH 396 (H) 01/08/2022   CALCIUM 9.0 08/05/2023   CAION 1.15 05/17/2023   PHOS 5.0 (H) 05/19/2023    Patient prescribed calcitriol and Cinacalcet outpatient.  Will continue to monitor bone minerals during this admission.  Currently acceptable.   LOS: 1 Bryauna Byrum 11/11/20241:02 PM

## 2023-08-06 NOTE — Progress Notes (Signed)
   08/06/23 1100  Spiritual Encounters  Type of Visit Initial  Care provided to: Patient  Conversation partners present during encounter Nurse  Referral source Patient request  Reason for visit Advance directives  OnCall Visit No  Spiritual Framework  Presenting Themes Courage hope and growth  Family Stress Factors Health changes;[name] life changes  Intervention Outcomes  Outcomes Awareness of support   Chaplain received a page to talk with the patient about updating her ADR. Chaplain gave patient the paperwork to go over with her son. Patient would like to wait for son before she completes the form. Patient shared that she will be going to rehab soon and shared some of the hardships that she has faced in the past year. Chaplain spoke with the nurse about some of the patients concerns. Chaplain services will continue to be available for emotional and spiritual support.

## 2023-08-07 ENCOUNTER — Encounter: Payer: Self-pay | Admitting: Internal Medicine

## 2023-08-07 ENCOUNTER — Inpatient Hospital Stay: Payer: Medicare HMO

## 2023-08-07 DIAGNOSIS — I639 Cerebral infarction, unspecified: Secondary | ICD-10-CM | POA: Diagnosis not present

## 2023-08-07 LAB — RENAL FUNCTION PANEL
Albumin: 3.5 g/dL (ref 3.5–5.0)
Anion gap: 12 (ref 5–15)
BUN: 66 mg/dL — ABNORMAL HIGH (ref 8–23)
CO2: 26 mmol/L (ref 22–32)
Calcium: 8.9 mg/dL (ref 8.9–10.3)
Chloride: 92 mmol/L — ABNORMAL LOW (ref 98–111)
Creatinine, Ser: 7.46 mg/dL — ABNORMAL HIGH (ref 0.44–1.00)
GFR, Estimated: 5 mL/min — ABNORMAL LOW (ref >60)
Glucose, Bld: 84 mg/dL (ref 70–99)
Phosphorus: 6.6 mg/dL — ABNORMAL HIGH (ref 2.5–4.6)
Potassium: 5.1 mmol/L (ref 3.5–5.1)
Sodium: 130 mmol/L — ABNORMAL LOW (ref 135–145)

## 2023-08-07 LAB — CBC
HCT: 31.3 % — ABNORMAL LOW (ref 36.0–46.0)
Hemoglobin: 10 g/dL — ABNORMAL LOW (ref 12.0–15.0)
MCH: 27.4 pg (ref 26.0–34.0)
MCHC: 31.9 g/dL (ref 30.0–36.0)
MCV: 85.8 fL (ref 80.0–100.0)
Platelets: 273 10*3/uL (ref 150–400)
RBC: 3.65 MIL/uL — ABNORMAL LOW (ref 3.87–5.11)
RDW: 14.6 % (ref 11.5–15.5)
WBC: 7.1 10*3/uL (ref 4.0–10.5)
nRBC: 0 % (ref 0.0–0.2)

## 2023-08-07 LAB — HEPATITIS B SURFACE ANTIBODY, QUANTITATIVE: Hep B S AB Quant (Post): 20 m[IU]/mL

## 2023-08-07 LAB — HEPATITIS B CORE ANTIBODY, TOTAL: Hep B Core Total Ab: REACTIVE — AB

## 2023-08-07 MED ORDER — HEPARIN SODIUM (PORCINE) 1000 UNIT/ML DIALYSIS: 1000.0000 [IU] | INTRAMUSCULAR | Status: DC | PRN

## 2023-08-07 MED ORDER — ALTEPLASE 2 MG IJ SOLR: 2.0000 mg | Freq: Once | INTRAMUSCULAR | Status: DC | PRN

## 2023-08-07 MED ORDER — HEPARIN SODIUM (PORCINE) 1000 UNIT/ML IJ SOLN
INTRAMUSCULAR | Status: AC
  Filled 2023-08-07: qty 10

## 2023-08-07 NOTE — Progress Notes (Signed)
Progress Note    Deanna [name]  XBJ:[id number] DOB: [birth year]  DOA: 08/04/2023 PCP: [facility]      Brief Narrative:    Medical records reviewed and are as summarized below:  Deanna [name] is a 72 y.o. female  with medical history significant for CVA, ESRD on HD, extensive PAD s/p recent angioplasty and stent, HFrEF with last EF of 30-35% (April 2023), hypertension, hyperlipidemia, type 2 diabetes, CAD, DISH arthritis, C3 anterior osteophyte fracture (was recently cleared by neurosurgeon to discontinue cervical collar on 07/25/2023).  She  presented to the hospital with weakness in the left upper and left lower extremities.  She said her symptoms started about a week prior to admission .    Assessment/Plan:   Principal Problem:   Acute CVA (cerebrovascular accident) (HCC) Active Problems:   HFrEF (heart failure with reduced ejection fraction) (HCC)   End-stage renal disease on hemodialysis (HCC)   Type 2 diabetes mellitus without complications (HCC)   CVA (cerebral vascular accident) (HCC)   CAD (coronary artery disease)   PAD (peripheral artery disease) (HCC)   Closed nondisplaced fracture of third cervical vertebra (HCC)   Body mass index is 20.65 kg/m.   Acute stroke (infarcts in the right thalamus and posterior right frontal lobe: Continue aspirin and Plavix.  Continue Lipitor. No hemodynamically significant stenosis on carotid duplex ultrasound 2D echo is pending. PT and OT recommended discharge to SNF. Follow-up with neurologist for further recommendations. Lipid panel: Total cholesterol 151, triglycerides 56, HDL 83, LDL 57. Hemoglobin A1c is 5.2.    Chronic systolic and diastolic CHF: Compensated. 2D echo in May 2023 showed EF estimated at 30 to 35%, grade 1 diastolic dysfunction, mild to moderate MR   ESRD on hemodialysis: Follow-up with nephrologist for hemodialysis.     CAD, PAD: Recent percutaneous transluminal  angioplasty of right peroneal artery and tibioperoneal trunk and right distal SFA and proximal popliteal artery on 07/23/2023. Continue aspirin and Plavix   Closed C3 vertebral fracture: She was evaluated by Dr. Katrinka Blazing, neurosurgeon, on 07/25/2023, who said it was okay to discontinue cervical collar on 07/25/2023   Chronic neck pain, DISH arthritis, chronic anemia   Awaiting placement to SNF for otherwise medically stable.   Diet Order             Diet heart healthy/carb modified Room service appropriate? Yes; Fluid consistency: Thin  Diet effective now                            Consultants: Neurologist Nephrologist  Procedures: None    Medications:    aspirin EC  81 mg Oral Daily   atorvastatin  40 mg Oral Daily   Chlorhexidine Gluconate Cloth  6 each Topical Daily   clopidogrel  75 mg Oral Daily   ferrous sulfate  325 mg Oral Q breakfast   heparin injection (subcutaneous)  5,000 Units Subcutaneous Q8H   midodrine  2.5 mg Oral TID WC   sevelamer carbonate  800 mg Oral TID WC   Continuous Infusions:   Anti-infectives (From admission, onward)    None              Family Communication/Anticipated D/C date and plan/Code Status   DVT prophylaxis: heparin injection 5,000 Units Start: 08/06/23 2200     Code Status: Full Code  Family Communication: None Disposition Plan: Plan to discharge to SNF   GEN: NAD SKIN: Warm and  dry EYES: No pallor or icterus ENT: MMM CV: RRR PULM: CTA B ABD: soft, ND, NT, +BS CNS: AAO x 3, mild left hemiparesis (LUE-4/5, LLE-3/5) EXT: No edema or tenderness        Subjective:   Interval events noted.  She has no complaints.  Weakness on the left side is improving.  No chest pain or shortness of breath.  She had hemodialysis this morning. She had chest pain yesterday, 08/06/2023.  She thinks her chest pain was from pain radiating from the neck/left shoulder.  Objective:    Vitals:   08/07/23  1204 08/07/23 1206 08/07/23 1245 08/07/23 1251  BP: (!) 129/59   128/62  Pulse: 85   82  Resp: 16  18 18   Temp: 97.8 F (36.6 C)   98 F (36.7 C)  TempSrc: Oral   Oral  SpO2: 100%   100%  Weight:  71 kg    Height:       No data found.   Intake/Output Summary (Last 24 hours) at 08/07/2023 1356 Last data filed at 08/07/2023 1204 Gross per 24 hour  Intake --  Output 1000 ml  Net -1000 ml   Filed Weights   08/04/23 1001 08/07/23 0809 08/07/23 1206  Weight: 73 kg 71.9 kg 71 kg    Exam:   GEN: NAD SKIN: Warm and dry EYES: EOMI ENT: MMM CV: RRR PULM: CTA B ABD: soft, ND, NT, +BS CNS: AAO x 3, mild left hemiparesis (LUE-4/5, LLE-3/5) EXT: No edema or tenderness       Data Reviewed:   I have personally reviewed following labs and imaging studies:  Labs: Labs show the following:   Basic Metabolic Panel: Recent Labs  Lab 08/04/23 1020 08/05/23 0907 08/06/23 1801 08/07/23 0836  NA 136 133* 134* 130*  K 3.7 4.1 5.2* 5.1  CL 95* 93* 95* 92*  CO2 29 27 27 26   GLUCOSE 145* 99 99 84  BUN 17 36* 59* 66*  CREATININE 3.19* 4.88* 6.75* 7.46*  CALCIUM 9.0 9.0 9.1 8.9  PHOS  --   --   --  6.6*   GFR Estimated Creatinine Clearance: 7.8 mL/min (A) (by C-G formula based on SCr of 7.46 mg/dL (H)). Liver Function Tests: Recent Labs  Lab 08/07/23 0836  ALBUMIN 3.5   No results for input(s): "LIPASE", "AMYLASE" in the last 168 hours. No results for input(s): "AMMONIA" in the last 168 hours. Coagulation profile No results for input(s): "INR", "PROTIME" in the last 168 hours.  CBC: Recent Labs  Lab 08/04/23 1020 08/05/23 0907 08/06/23 1801 08/07/23 0836  WBC 10.1 7.1 7.1 7.1  NEUTROABS  --  4.2  --   --   HGB 11.3* 10.3* 10.1* 10.0*  HCT 35.3* 31.7* 30.8* 31.3*  MCV 85.9 84.1 85.3 85.8  PLT 283 258 262 273   Cardiac Enzymes: No results for input(s): "CKTOTAL", "CKMB", "CKMBINDEX", "TROPONINI" in the last 168 hours. BNP (last 3 results) No results for  input(s): "PROBNP" in the last 8760 hours. CBG: Recent Labs  Lab 08/05/23 1106 08/05/23 1628 08/05/23 2034 08/06/23 0806 08/06/23 1729  GLUCAP 94 100* 115* 85 101*   D-Dimer: No results for input(s): "DDIMER" in the last 72 hours. Hgb A1c: Recent Labs    08/05/23 0907  HGBA1C 5.2   Lipid Profile: Recent Labs    08/05/23 0907  CHOL 151  HDL 83  LDLCALC 57  TRIG 56  CHOLHDL 1.8   Thyroid function studies: No results for input(s): "  TSH", "T4TOTAL", "T3FREE", "THYROIDAB" in the last 72 hours.  Invalid input(s): "FREET3" Anemia work up: No results for input(s): "VITAMINB12", "FOLATE", "FERRITIN", "TIBC", "IRON", "RETICCTPCT" in the last 72 hours. Sepsis Labs: Recent Labs  Lab 08/04/23 1020 08/05/23 0907 08/06/23 1801 08/07/23 0836  WBC 10.1 7.1 7.1 7.1    Microbiology No results found for this or any previous visit (from the past 240 hour(s)).  Procedures and diagnostic studies:  CT ANGIO HEAD NECK W WO CM  Result Date: 08/07/2023 CLINICAL DATA:  Initial evaluation for neuro deficit, stroke suspected. EXAM: CT ANGIOGRAPHY HEAD AND NECK WITH AND WITHOUT CONTRAST TECHNIQUE: Multidetector CT imaging of the head and neck was performed using the standard protocol during bolus administration of intravenous contrast. Multiplanar CT image reconstructions and MIPs were obtained to evaluate the vascular anatomy. Carotid stenosis measurements (when applicable) are obtained utilizing NASCET criteria, using the distal internal carotid diameter as the denominator. RADIATION DOSE REDUCTION: This exam was performed according to the departmental dose-optimization program which includes automated exposure control, adjustment of the mA and/or kV according to patient size and/or use of iterative reconstruction technique. CONTRAST:  75mL OMNIPAQUE IOHEXOL 350 MG/ML SOLN COMPARISON:  Comparison made with CTA and ultrasound from 08/05/2023. FINDINGS: CT HEAD FINDINGS Brain: Cerebral volume  within normal limits. Moderate chronic microvascular ischemic disease. Previously identified small volume right cerebral infarcts not visible by CT. No other acute large vessel territory infarct. No acute intracranial hemorrhage. No mass lesion or midline shift. No hydrocephalus or extra-axial fluid collection. Vascular: No abnormal hyperdense vessel. Skull: Scalp soft tissues and calvarium demonstrate no new finding. Sinuses/Orbits: Globes and orbital soft tissues within normal limits. Paranasal sinuses are largely clear. Trace right mastoid effusion. Other: None. Review of the MIP images confirms the above findings CTA NECK FINDINGS Aortic arch: Aortic arch within normal limits for caliber with standard branch pattern. Mild aortic atherosclerosis. No stenosis about the origin the great vessels. Right carotid system: Right common and internal carotid arteries are patent without dissection. Moderate calcified plaque about the right carotid bulb with associated stenosis of up to 50% by NASCET criteria. Left carotid system: Left common and internal carotid arteries are patent without dissection. Moderate calcified plaque about the left carotid bulb without hemodynamically significant greater than 50% stenosis. Vertebral arteries: Both vertebral arteries arise from subclavian arteries. No proximal subclavian artery stenosis. Vertebral arteries are patent without stenosis or dissection. 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 No worrisome osseous lesions. Patient is edentulous. Other neck: No other acute finding. Upper chest: No other acute finding. Review of the MIP images confirms the above findings CTA HEAD FINDINGS Anterior circulation: Atheromatous change about the carotid siphons bilaterally, right worse than left. Resultant moderate to severe stenosis at the supraclinoid right ICA, with up to moderate narrowing at the contralateral left  paraclinoid ICA. A1 segments patent bilaterally. Normal anterior communicating artery complex. Anterior cerebral arteries patent without stenosis. No M1 stenosis or occlusion. Distal MCA branches perfused and symmetric. Posterior circulation: Both V4 segments widely patent. Right PICA patent. Left PICA origin not well seen. Basilar widely patent without stenosis. Superior cerebral arteries patent bilaterally. Both PCAs primarily supplied via the basilar. Short-segment mild proximal left P2 stenosis (series 12, image 128). PCAs otherwise patent to their distal aspects without significant stenosis. Venous sinuses: Grossly patent allowing for timing the contrast bolus. Anatomic variants: None significant.  No aneurysm. Review of the MIP images confirms the above findings IMPRESSION: CT HEAD:  1. No acute intracranial abnormality. Previously identified small volume right cerebral infarcts not visible by CT. 2. Moderate chronic microvascular ischemic disease. CTA HEAD AND NECK: 1. Negative CTA for large vessel occlusion or other emergent finding. 2. Atheromatous change about the carotid siphons bilaterally, right worse than left, with resultant moderate to severe stenosis at the supraclinoid right ICA, with up to moderate narrowing at the contralateral left paraclinoid ICA. 3. Moderate calcified plaque about the right carotid bulb with associated stenosis of up to 50% by NASCET criteria. 4.  Aortic Atherosclerosis (ICD10-I70.0). Electronically Signed   By: Rise Mu M.D.   On: 08/07/2023 03:32   US Carotid Bilateral  Result Date: 08/06/2023 CLINICAL DATA:  71 year old female with a history of stroke EXAM: BILATERAL CAROTID DUPLEX ULTRASOUND TECHNIQUE: Wallace Cullens scale imaging, color Doppler and duplex ultrasound were performed of bilateral carotid and vertebral arteries in the neck. COMPARISON:  None Available. FINDINGS: Criteria: Quantification of carotid stenosis is based on velocity parameters that correlate  the residual internal carotid diameter with NASCET-based stenosis levels, using the diameter of the distal internal carotid lumen as the denominator for stenosis measurement. The following velocity measurements were obtained: RIGHT ICA:  Systolic 97 cm/sec, Diastolic 28 cm/sec CCA:  93 cm/sec SYSTOLIC ICA/CCA RATIO:  1.0 ECA:  159 cm/sec LEFT ICA:  Systolic 130 cm/sec, Diastolic 19 cm/sec CCA:  100 cm/sec SYSTOLIC ICA/CCA RATIO:  1.3 ECA:  129 cm/sec Right Brachial SBP: Not acquired Left Brachial SBP: Not acquired RIGHT CAROTID ARTERY: No significant calcifications of the right common carotid artery. Intermediate waveform maintained. Moderate heterogeneous and partially calcified plaque at the right carotid bifurcation. Lumen shadowing at the bifurcation, with the maximal velocity obtained distal to the bifurcation. Low resistance waveform of the right ICA. No significant tortuosity. RIGHT VERTEBRAL ARTERY: Antegrade flow with low resistance waveform. LEFT CAROTID ARTERY: No significant calcifications of the left common carotid artery. Intermediate waveform maintained. Moderate heterogeneous and partially calcified plaque at the left carotid bifurcation. Shadowing present at the bifurcation. Low resistance waveform of the left ICA. No significant tortuosity. LEFT VERTEBRAL ARTERY:  Antegrade flow with low resistance waveform. IMPRESSION: Right: Color duplex indicates moderate heterogeneous and calcified plaque, with no hemodynamically significant stenosis by duplex criteria in the extracranial cerebrovascular circulation. Note that flow velocities of the right ICA were obtained from an area distal to the maximum narrowing due to the presence of anterior wall plaque with shadowing and may be underestimating the percentage of ICA stenosis. If establishing a more accurate degree of stenosis is required, cerebral angiogram should be considered, or as a second best test, CTA. Left: Color duplex indicates moderate  heterogeneous and calcified plaque, with no hemodynamically significant stenosis by duplex criteria in the extracranial cerebrovascular circulation. Signed, Yvone Neu. Miachel Roux, RPVI [facility] Electronically Signed   By: Gilmer Mor D.O.   On: 08/06/2023 12:33   CT ANGIO HEAD NECK W WO CM  Result Date: 08/05/2023 CLINICAL DATA:  Neuro deficit, acute, stroke suspected EXAM: CT ANGIOGRAPHY HEAD AND NECK WITH AND WITHOUT CONTRAST TECHNIQUE: Multidetector CT imaging of the head and neck was performed using the standard protocol during bolus administration of intravenous contrast. Multiplanar CT image reconstructions and MIPs were obtained to evaluate the vascular anatomy. Carotid stenosis measurements (when applicable) are obtained utilizing NASCET criteria, using the distal internal carotid diameter as the denominator. RADIATION DOSE REDUCTION: This exam was performed according to the departmental dose-optimization program which includes automated exposure control,  adjustment of the mA and/or kV according to patient size and/or use of iterative reconstruction technique. CONTRAST:  75mL OMNIPAQUE IOHEXOL 350 MG/ML SOLN COMPARISON:  Brain MR 08/04/23 FINDINGS: CT HEAD FINDINGS Brain: No hemorrhage. No hydrocephalus. No extra-axial fluid collection. No mass effect. No mass lesion. Chronic infarcts in the left caudate head. There is background of mild chronic microvascular ischemic change. Vascular: No hyperdense vessel or unexpected calcification. Skull: Normal. Negative for fracture or focal lesion. Sinuses/Orbits: No middle ear or mastoid effusion. Paranasal sinuses are clear. Bilateral lens replacement. Orbits are otherwise unremarkable. Other: There is anterior subluxation of the mandibular heads bilaterally without dislocation Review of the MIP images confirms the above findings CTA NECK FINDINGS Limitations: Motion degraded exam particularly at  the level of the carotid bifurcation. Detailed assessment for the degree of vessel stenosis is limited at this level. Aortic arch: Standard branching. Imaged portion shows no evidence of aneurysm or dissection. No significant stenosis of the major arch vessel origins. Right carotid system: No evidence of dissection, stenosis (50% or greater), or occlusion. Left carotid system: No evidence of dissection, stenosis (50% or greater), or occlusion. Vertebral arteries: Codominant. No evidence of dissection, stenosis (50% or greater), or occlusion. Skeleton: There are osseous findings of DISH multilevel anterior bridging osteophytes. There is likely at least moderate spinal canal narrowing C5-C6 and C6-C7 Other neck: Right-sided central venous catheter in place. Upper chest: Patulous and fluid-filled esophagus. Review of the MIP images confirms the above findings CTA HEAD FINDINGS Anterior circulation: Mild narrowing of the supraclinoid ICA on the right (series 12, image 123) no proximal occlusion, aneurysm, or vascular malformation. Posterior circulation: No significant stenosis, proximal occlusion, aneurysm, or vascular malformation. Venous sinuses: As permitted by contrast timing, patent. Anatomic variants: None Review of the MIP images confirms the above findings IMPRESSION: 1. No acute intracranial abnormality. Chronic infarcts in the left caudate head. Infarcts seen on recent brain MRI are not visualized due to CT technique. 2. No intracranial large vessel occlusion. Mild narrowing of the supraclinoid ICA on the right. 3. Note that assessment of the neck vasculature is markedly limited due to the degree of motion artifact. Within this limitation, no definite evidence of an alveolar or hemodynamically significant stenosis. 4. Patulous and fluid-filled esophagus, which could predispose to aspiration. 5. Osseous findings of DISH with likely at least moderate spinal canal narrowing at C5-C6 and C6-C7. Electronically  Signed   By: Lorenza Cambridge M.D.   On: 08/05/2023 16:24               LOS: 2 days   Jasey Cortez  [facility]   Pager on [email or web address]. If 7PM-7AM, please contact night-coverage at [email or web address]     08/07/2023, 1:56 PM

## 2023-08-07 NOTE — Progress Notes (Signed)
NEUROLOGY CONSULT FOLLOW UP NOTE   Date of service: August 07, 2023 Patient Name: Deanna [name] MRN:  [id number] DOB:  [birth year]  Brief HPI  Deshannon Kostiuk is a 72 y.o. 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 who presents with numbness and weakness that has been present for a week, but worsened significantly during dialysis on 11/09 (day PTA).  She states that she noticed it but was still able to get around and do what she needed at home and therefore did not seek care.  Yesterday during dialysis, she had abrupt worsening of her left-sided weakness and was brought into the emergency department.   MRI reveals two separate infarcts in the right hemisphere.  She had been refusing testing, but did agree to get a CT angio 11/10. Due to motion limited CTA, Carotid US was obtained, which was limited by calcified plaque. Repeat CTA was obtained   Current Facility-Administered Medications:    acetaminophen (TYLENOL) tablet 650 mg, 650 mg, Oral, Q4H PRN **OR** acetaminophen (TYLENOL) 160 MG/5ML solution 650 mg, 650 mg, Per Tube, Q4H PRN **OR** acetaminophen (TYLENOL) suppository 650 mg, 650 mg, Rectal, Q4H PRN, Lurene Shadow, MD   aspirin EC tablet 81 mg, 81 mg, Oral, Daily, Lurene Shadow, MD, 81 mg at 08/06/23 6213   atorvastatin (LIPITOR) tablet 40 mg, 40 mg, Oral, Daily, Lurene Shadow, MD, 40 mg at 08/06/23 0865   Chlorhexidine Gluconate Cloth 2 % PADS 6 each, 6 each, Topical, Daily, Lurene Shadow, MD, 6 each at 08/06/23 1042   clopidogrel (PLAVIX) tablet 75 mg, 75 mg, Oral, Daily, Lurene Shadow, MD, 75 mg at  08/06/23 7846   fentaNYL (SUBLIMAZE) injection 12.5 mcg, 12.5 mcg, Intravenous, Q5 Min x 2 PRN, Lurene Shadow, MD   ferrous sulfate tablet 325 mg, 325 mg, Oral, Q breakfast, Lurene Shadow, MD, 325 mg at 08/06/23 0921   heparin injection 5,000 Units, 5,000 Units, Subcutaneous, Q8H, Lurene Shadow, MD, 5,000 Units at 08/07/23 1409   midodrine (PROAMATINE) tablet 2.5 mg, 2.5 mg, Oral, TID WC, Lurene Shadow, MD, 2.5 mg at 08/06/23 1711   ondansetron (ZOFRAN) injection 4 mg, 4 mg, Intravenous, Q6H PRN, Lurene Shadow, MD, 4 mg at 08/04/23 1827   oxyCODONE-acetaminophen (PERCOCET/ROXICET) 5-325 MG per tablet 1 tablet, 1 tablet, Oral, Q6H PRN, Lurene Shadow, MD, 1 tablet at 08/06/23 1712   senna-docusate (Senokot-S) tablet 1 tablet, 1 tablet, Oral, QHS PRN, Lurene Shadow, MD   sevelamer carbonate (RENVELA) tablet 800 mg, 800 mg, Oral, TID WC, Lurene Shadow, MD, 800 mg at 08/06/23 1711    Interval Hx/subjective   - Worked with PT today, progressing well but fatigued  - Reports arm weakness yesterday was secondary to acute pain, feels much better today  Vitals   Current vital signs: BP 128/62 (BP Location: Left Arm)   Pulse 82   Temp 98 F (36.7 C) (Oral)   Resp 18   Ht 6\' 1"  (1.854 m)   Wt 71 kg   SpO2 100%   BMI 20.65 kg/m  Vital signs in last 24 hours: Temp:  [97.4 F (36.3 C)-98.2 F (36.8 C)] 98 F (36.7 C) (11/12 1251) Pulse Rate:  [25-93] 82 (11/12 1251) Resp:  [14-21] 18 (11/12 1251) BP: (110-162)/(58-76) 128/62 (  11/12 1251) SpO2:  [96 %-100 %] 100 % (11/12 1251) Weight:  [71 kg-71.9 kg] 71 kg (11/12 1206)  Body mass index is 20.65 kg/m.  Physical Exam   Constitutional: Appears thin/frail but in no acute distress Psych: Calm and cooperative Respiratory: Effort normal, non-labored breathing.  GI:  No distension.   Neurologic Examination    Neuro:  Mental Status: Oriented to situation, fluent conversational speech Cranial Nerves: Minimal left facial droop.   Tracking examiner, hearing intact to voice, tongue midline Motor: Just worked with PT and fatigued so declining full exam but using both arms and legs well to get into bed from standing and reposition herself in bed. No obvious asymmetry today on casual observation    Labs and Diagnostic Imaging   CBC:  Recent Labs  Lab 08/05/23 0907 08/06/23 1801 08/07/23 0836  WBC 7.1 7.1 7.1  NEUTROABS 4.2  --   --   HGB 10.3* 10.1* 10.0*  HCT 31.7* 30.8* 31.3*  MCV 84.1 85.3 85.8  PLT 258 262 273    Basic Metabolic Panel:  Lab Results  Component Value Date   NA 130 (L) 08/07/2023   K 5.1 08/07/2023   CO2 26 08/07/2023   GLUCOSE 84 08/07/2023   BUN 66 (H) 08/07/2023   CREATININE 7.46 (H) 08/07/2023   CALCIUM 8.9 08/07/2023   GFRNONAA 5 (L) 08/07/2023   GFRAA 36 (L) 12/09/2017   Lipid Panel:  Lab Results  Component Value Date   CHOL 151 08/05/2023   HDL 83 08/05/2023   LDLCALC 57 08/05/2023   TRIG 56 08/05/2023   CHOLHDL 1.8 08/05/2023   HgbA1c:  Lab Results  Component Value Date   HGBA1C 5.2 08/05/2023   Urine Drug Screen:     Component Value Date/Time   LABOPIA NONE DETECTED 12/27/2021 0244   COCAINSCRNUR NONE DETECTED 12/27/2021 0244   LABBENZ NONE DETECTED 12/27/2021 0244   AMPHETMU NONE DETECTED 12/27/2021 0244   THCU NONE DETECTED 12/27/2021 0244   LABBARB NONE DETECTED 12/27/2021 0244    Alcohol Level No results found for: "ETH" INR  Lab Results  Component Value Date   INR 1.1 04/28/2023   APTT  Lab Results  Component Value Date   APTT 38 (H) 04/28/2023   AED levels: No results found for: "PHENYTOIN", "ZONISAMIDE", "LAMOTRIGINE", "LEVETIRACETA"  CTA head and neck repeat on 11/11, reviewed with patient at bedside 1. Negative CTA for large vessel occlusion or other emergent finding. 2. Atheromatous change about the carotid siphons bilaterally, right worse than left, with resultant moderate to severe stenosis at the supraclinoid right ICA, with up to  moderate narrowing at the contralateral left paraclinoid ICA. 3. Moderate calcified plaque about the right carotid bulb with associated stenosis of up to 50% by NASCET criteria. 4.  Aortic Atherosclerosis (ICD10-I70.0).  Carotid ultrasound Right: Color duplex indicates moderate heterogeneous and calcified plaque, with no hemodynamically significant stenosis by duplex criteria in the extracranial cerebrovascular circulation. Note that flow velocities of the right ICA were obtained from an area distal to the maximum narrowing due to the presence of anterior wall plaque with shadowing and may be underestimating the percentage of ICA stenosis. If establishing a more accurate degree of stenosis is required, cerebral angiogram should be considered, or as a second best test, CTA.   Left: Color duplex indicates moderate heterogeneous and calcified plaque, with no hemodynamically significant stenosis by duplex criteria in the extracranial cerebrovascular circulation.  CT angio Head and Neck with contrast(Personally reviewed): 1. No acute  intracranial abnormality. Chronic infarcts in the left caudate head. Infarcts seen on recent brain MRI are not visualized due to CT technique. 2. No intracranial large vessel occlusion. Mild narrowing of the supraclinoid ICA on the right. 3. Note that assessment of the neck vasculature is markedly limited due to the degree of motion artifact. Within this limitation, no definite evidence of an alveolar or hemodynamically significant stenosis. 4. Patulous and fluid-filled esophagus, which could predispose to aspiration. 5. Osseous findings of DISH with likely at least moderate spinal canal narrowing at C5-C6 and C6-C7.  MRI Brain(Personally reviewed): 1. 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 No hemorrhage. 2. Redemonstrated osseous findings of DISH. T2/STIR hyperintense signal abnormality with the anterior  osteophyte at C3-C4 and T1-T2, likely representing healing posttraumatic changes. 3. There may be very mild T2 hyperintense signal abnormality in the cervical spinal cord at the C3-C4 level, which may be artifactual or represent myelomalacia. 4. Additional degenerative changes as above.   Lab Results  Component Value Date   HGBA1C 5.2 08/05/2023     Impression   Aylyn Rayner is a 72 y.o. female with a highly embolic appearing stroke in the right hemisphere.  She does have calcified plaque in the right ICA, although only 50% stenosis by NASCET criteria -- additionally given C-spine disease would be high risk for any procedure requiring intubation  Echocardiogram is completed, report pending.  Given prior echocardiogram did show mild atrial dilation, if vessel imaging is unrevealing, I do think that longer-term monitoring (Zio patch versus loop recorder) would be helpful to rule out occult atrial fibrillation  Of note she does have fluctuations in exam secondary to pain  From a stroke risk perspective she is otherwise meeting A1c and LDL goals  Recommendations  -Echocardiogram report pending -Continue chronic DAPT (per vascular surgery indication, will also cover from stroke perspective) -Reached out to cardiology for consideration of loop recorder; if loop is not possible would do zio patch -If no Afib/flutter found in the next 1-2 months would refer to neurointerventional IR for consideration of diagnostic angiogram especially if she continues to have left sided symptoms ([facility] January, case has been discussed with Dr. Tommie Sams but referral deferred as she is out of network) -Avoid hypotension -Neurology will follow-up echocardiogram report, otherwise will sign off at this time, please reach out if other questions or concerns arise ______________________________________________________________________   Thank you for the opportunity to take part in the care of this  patient. If you have any further questions, please contact the neurology consultation team on call. Updated oncall schedule is listed on [facility].  Signed,  Won Kreuzer L Robel Wuertz  >50 min spent in care of

## 2023-08-07 NOTE — Progress Notes (Signed)
[facility]  ROUNDING NOTE   Subjective:   Deanna [name]  is a 72 year old African-American female with a past medical history of coronary artery disease, status post PCI, CHF with reduced ejection fraction, CKD, diabetes mellitus, hypertension, and end stage renal disease on hemodialysis. Patient presents to the ED from her dialysis center with left arm pain and weakness.  Patient has been admitted for Acute CVA (cerebrovascular accident) [facility]) [I63.9] Cerebrovascular accident (CVA), unspecified mechanism (HCC) [I63.9] CVA (cerebral vascular accident) [facility]) [I63.9]  Patient is known to our practice and receives outpatient dialysis treatments at [facility] on a TTS schedule, supervised by Dr. Cherylann Ratel.   Update Patient seen and evaluated during dialysis   HEMODIALYSIS FLOWSHEET:  Blood Flow Rate (mL/min): 0 mL/min Arterial Pressure (mmHg): -40.4 mmHg Venous Pressure (mmHg): 23.63 mmHg TMP (mmHg): 14.14 mmHg Ultrafiltration Rate (mL/min): 538 mL/min Dialysate Flow Rate (mL/min): 299 ml/min  Tolerating meals well Denies chest pain Left sided weakness remains    Objective:  Vital signs in last 24 hours:  Temp:  [97.4 F (36.3 C)-98.2 F (36.8 C)] 98 F (36.7 C) (11/12 1251) Pulse Rate:  [25-93] 82 (11/12 1251) Resp:  [14-21] 18 (11/12 1251) BP: (110-162)/(58-76) 128/62 (11/12 1251) SpO2:  [96 %-100 %] 100 % (11/12 1251) Weight:  [71 kg-71.9 kg] 71 kg (11/12 1206)  Weight change:  Filed Weights   08/04/23 1001 08/07/23 0809 08/07/23 1206  Weight: 73 kg 71.9 kg 71 kg    Intake/Output: No intake/output data recorded.   Intake/Output this shift:  Total I/O In: -  Out: 1000 [Other:1000]  Physical Exam: General: NAD  Head: Normocephalic, atraumatic. Moist oral mucosal membranes  Eyes: Anicteric  Lungs:  Clear to auscultation, room air  Heart: Regular rate and rhythm  Abdomen:  Soft, nontender  Extremities: Trace peripheral edema.  Neurologic:  Alert, moving all four extremities  Skin: No lesions  Access: Rt permcath, AVG placed on 05/17/23     Basic Metabolic Panel: Recent Labs  Lab 08/04/23 1020 08/05/23 0907 08/06/23 1801 08/07/23 0836  NA 136 133* 134* 130*  K 3.7 4.1 5.2* 5.1  CL 95* 93* 95* 92*  CO2 29 27 27 26   GLUCOSE 145* 99 99 84  BUN 17 36* 59* 66*  CREATININE 3.19* 4.88* 6.75* 7.46*  CALCIUM 9.0 9.0 9.1 8.9  PHOS  --   --   --  6.6*    Liver Function Tests: Recent Labs  Lab 08/07/23 0836  ALBUMIN 3.5   No results for input(s): "LIPASE", "AMYLASE" in the last 168 hours. No results for input(s): "AMMONIA" in the last 168 hours.  CBC: Recent Labs  Lab 08/04/23 1020 08/05/23 0907 08/06/23 1801 08/07/23 0836  WBC 10.1 7.1 7.1 7.1  NEUTROABS  --  4.2  --   --   HGB 11.3* 10.3* 10.1* 10.0*  HCT 35.3* 31.7* 30.8* 31.3*  MCV 85.9 84.1 85.3 85.8  PLT 283 258 262 273    Cardiac Enzymes: No results for input(s): "CKTOTAL", "CKMB", "CKMBINDEX", "TROPONINI" in the last 168 hours.  BNP: Invalid input(s): "POCBNP"  CBG: Recent Labs  Lab 08/05/23 1106 08/05/23 1628 08/05/23 2034 08/06/23 0806 08/06/23 1729  GLUCAP 94 100* 115* 85 101*    Microbiology: Results for orders placed or performed during the hospital encounter of 01/20/22  Blood culture (routine x 2)     Status: None   Collection Time: 01/20/22  7:22 PM   Specimen: BLOOD  Result Value Ref Range Status  Specimen Description BLOOD BLOOD RIGHT HAND  Final   Special Requests   Final    BOTTLES DRAWN AEROBIC AND ANAEROBIC Blood Culture results may not be optimal due to an inadequate volume of blood received in culture bottles   Culture   Final    NO GROWTH 5 DAYS Performed at [facility], [address] [place], [place] [address]    Report Status 01/25/2022 FINAL  Final  Blood culture (routine x 2)     Status: None   Collection Time: 01/20/22  7:22 PM   Specimen: BLOOD  Result Value Ref Range Status   Specimen  Description BLOOD BLOOD LEFT HAND  Final   Special Requests   Final    BOTTLES DRAWN AEROBIC AND ANAEROBIC Blood Culture results may not be optimal due to an inadequate volume of blood received in culture bottles   Culture   Final    NO GROWTH 5 DAYS Performed at [facility], [address] [place], [place] [address]    Report Status 01/25/2022 FINAL  Final  Resp Panel by RT-PCR (Flu A&B, Covid) Nasopharyngeal Swab     Status: None   Collection Time: 01/20/22  7:28 PM   Specimen: Nasopharyngeal Swab; Nasopharyngeal(NP) swabs in vial transport medium  Result Value Ref Range Status   SARS Coronavirus 2 by RT PCR NEGATIVE NEGATIVE Final    Comment: (NOTE) SARS-CoV-2 target nucleic acids are NOT DETECTED.  The SARS-CoV-2 RNA is generally detectable in upper respiratory specimens during the acute phase of infection. The lowest concentration of SARS-CoV-2 viral copies this assay can detect is 138 copies/mL. A negative result does not preclude SARS-Cov-2 infection and should not be used as the sole basis for treatment or other patient management decisions. A negative result may occur with  improper specimen collection/handling, submission of specimen other than nasopharyngeal swab, presence of viral mutation(s) within the areas targeted by this assay, and inadequate number of viral copies(<138 copies/mL). A negative result must be combined with clinical observations, patient history, and epidemiological information. The expected result is Negative.  Fact Sheet for Patients:  BloggerCourse.com  Fact Sheet for Healthcare Providers:  SeriousBroker.it  This test is no t yet approved or cleared by the [place] FDA and  has been authorized for detection and/or diagnosis of SARS-CoV-2 by FDA under an Emergency Use Authorization (EUA). This EUA will remain  in effect (meaning this test can be used) for the duration of  the COVID-19 declaration under Section 564(b)(1) of the Act, 21 U.S.C.section 360bbb-3(b)(1), unless the authorization is terminated  or revoked sooner.       Influenza A by PCR NEGATIVE NEGATIVE Final   Influenza B by PCR NEGATIVE NEGATIVE Final    Comment: (NOTE) The Xpert Xpress SARS-CoV-2/FLU/RSV plus assay is intended as an aid in the diagnosis of influenza from Nasopharyngeal swab specimens and should not be used as a sole basis for treatment. Nasal washings and aspirates are unacceptable for Xpert Xpress SARS-CoV-2/FLU/RSV testing.  Fact Sheet for Patients: BloggerCourse.com  Fact Sheet for Healthcare Providers: SeriousBroker.it  This test is not yet approved or cleared by the [place] FDA and has been authorized for detection and/or diagnosis of SARS-CoV-2 by FDA under an Emergency Use Authorization (EUA). This EUA will remain in effect (meaning this test can be used) for the duration of the COVID-19 declaration under Section 564(b)(1) of the Act, 21 U.S.C. section 360bbb-3(b)(1), unless the authorization is terminated or revoked.  Performed at [facility],  [address], [place], [place] [address]     Coagulation Studies: No results for input(s): "LABPROT", "INR" in the last 72 hours.  Urinalysis: No results for input(s): "COLORURINE", "LABSPEC", "PHURINE", "GLUCOSEU", "HGBUR", "BILIRUBINUR", "KETONESUR", "PROTEINUR", "UROBILINOGEN", "NITRITE", "LEUKOCYTESUR" in the last 72 hours.  Invalid input(s): "APPERANCEUR"    Imaging: CT ANGIO HEAD NECK W WO CM  Result Date: 08/07/2023 CLINICAL DATA:  Initial evaluation for neuro deficit, stroke suspected. EXAM: CT ANGIOGRAPHY HEAD AND NECK WITH AND WITHOUT CONTRAST TECHNIQUE: Multidetector CT imaging of the head and neck was performed using the standard protocol during bolus administration of intravenous contrast. Multiplanar CT image reconstructions and  MIPs were obtained to evaluate the vascular anatomy. Carotid stenosis measurements (when applicable) are obtained utilizing NASCET criteria, using the distal internal carotid diameter as the denominator. RADIATION DOSE REDUCTION: This exam was performed according to the departmental dose-optimization program which includes automated exposure control, adjustment of the mA and/or kV according to patient size and/or use of iterative reconstruction technique. CONTRAST:  75mL OMNIPAQUE IOHEXOL 350 MG/ML SOLN COMPARISON:  Comparison made with CTA and ultrasound from 08/05/2023. FINDINGS: CT HEAD FINDINGS Brain: Cerebral volume within normal limits. Moderate chronic microvascular ischemic disease. Previously identified small volume right cerebral infarcts not visible by CT. No other acute large vessel territory infarct. No acute intracranial hemorrhage. No mass lesion or midline shift. No hydrocephalus or extra-axial fluid collection. Vascular: No abnormal hyperdense vessel. Skull: Scalp soft tissues and calvarium demonstrate no new finding. Sinuses/Orbits: Globes and orbital soft tissues within normal limits. Paranasal sinuses are largely clear. Trace right mastoid effusion. Other: None. Review of the MIP images confirms the above findings CTA NECK FINDINGS Aortic arch: Aortic arch within normal limits for caliber with standard branch pattern. Mild aortic atherosclerosis. No stenosis about the origin the great vessels. Right carotid system: Right common and internal carotid arteries are patent without dissection. Moderate calcified plaque about the right carotid bulb with associated stenosis of up to 50% by NASCET criteria. Left carotid system: Left common and internal carotid arteries are patent without dissection. Moderate calcified plaque about the left carotid bulb without hemodynamically significant greater than 50% stenosis. Vertebral arteries: Both vertebral arteries arise from subclavian arteries. No proximal  subclavian artery stenosis. Vertebral arteries are patent without stenosis or dissection. 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 No worrisome osseous lesions. Patient is edentulous. Other neck: No other acute finding. Upper chest: No other acute finding. Review of the MIP images confirms the above findings CTA HEAD FINDINGS Anterior circulation: Atheromatous change about the carotid siphons bilaterally, right worse than left. Resultant moderate to severe stenosis at the supraclinoid right ICA, with up to moderate narrowing at the contralateral left paraclinoid ICA. A1 segments patent bilaterally. Normal anterior communicating artery complex. Anterior cerebral arteries patent without stenosis. No M1 stenosis or occlusion. Distal MCA branches perfused and symmetric. Posterior circulation: Both V4 segments widely patent. Right PICA patent. Left PICA origin not well seen. Basilar widely patent without stenosis. Superior cerebral arteries patent bilaterally. Both PCAs primarily supplied via the basilar. Short-segment mild proximal left P2 stenosis (series 12, image 128). PCAs otherwise patent to their distal aspects without significant stenosis. Venous sinuses: Grossly patent allowing for timing the contrast bolus. Anatomic variants: None significant.  No aneurysm. Review of the MIP images confirms the above findings IMPRESSION: CT HEAD: 1. No acute intracranial abnormality. Previously identified small volume right cerebral infarcts not visible by CT. 2. Moderate chronic microvascular ischemic  disease. CTA HEAD AND NECK: 1. Negative CTA for large vessel occlusion or other emergent finding. 2. Atheromatous change about the carotid siphons bilaterally, right worse than left, with resultant moderate to severe stenosis at the supraclinoid right ICA, with up to moderate narrowing at the contralateral left paraclinoid ICA. 3. Moderate calcified plaque about  the right carotid bulb with associated stenosis of up to 50% by NASCET criteria. 4.  Aortic Atherosclerosis (ICD10-I70.0). Electronically Signed   By: Rise Mu M.D.   On: 08/07/2023 03:32   US Carotid Bilateral  Result Date: 08/06/2023 CLINICAL DATA:  72 year old female with a history of stroke EXAM: BILATERAL CAROTID DUPLEX ULTRASOUND TECHNIQUE: Wallace Cullens scale imaging, color Doppler and duplex ultrasound were performed of bilateral carotid and vertebral arteries in the neck. COMPARISON:  None Available. FINDINGS: Criteria: Quantification of carotid stenosis is based on velocity parameters that correlate the residual internal carotid diameter with NASCET-based stenosis levels, using the diameter of the distal internal carotid lumen as the denominator for stenosis measurement. The following velocity measurements were obtained: RIGHT ICA:  Systolic 97 cm/sec, Diastolic 28 cm/sec CCA:  93 cm/sec SYSTOLIC ICA/CCA RATIO:  1.0 ECA:  159 cm/sec LEFT ICA:  Systolic 130 cm/sec, Diastolic 19 cm/sec CCA:  100 cm/sec SYSTOLIC ICA/CCA RATIO:  1.3 ECA:  129 cm/sec Right Brachial SBP: Not acquired Left Brachial SBP: Not acquired RIGHT CAROTID ARTERY: No significant calcifications of the right common carotid artery. Intermediate waveform maintained. Moderate heterogeneous and partially calcified plaque at the right carotid bifurcation. Lumen shadowing at the bifurcation, with the maximal velocity obtained distal to the bifurcation. Low resistance waveform of the right ICA. No significant tortuosity. RIGHT VERTEBRAL ARTERY: Antegrade flow with low resistance waveform. LEFT CAROTID ARTERY: No significant calcifications of the left common carotid artery. Intermediate waveform maintained. Moderate heterogeneous and partially calcified plaque at the left carotid bifurcation. Shadowing present at the bifurcation. Low resistance waveform of the left ICA. No significant tortuosity. LEFT VERTEBRAL ARTERY:  Antegrade flow with  low resistance waveform. IMPRESSION: Right: Color duplex indicates moderate heterogeneous and calcified plaque, with no hemodynamically significant stenosis by duplex criteria in the extracranial cerebrovascular circulation. Note that flow velocities of the right ICA were obtained from an area distal to the maximum narrowing due to the presence of anterior wall plaque with shadowing and may be underestimating the percentage of ICA stenosis. If establishing a more accurate degree of stenosis is required, cerebral angiogram should be considered, or as a second best test, CTA. Left: Color duplex indicates moderate heterogeneous and calcified plaque, with no hemodynamically significant stenosis by duplex criteria in the extracranial cerebrovascular circulation. Signed, Yvone Neu. Miachel Roux, RPVI [facility] Electronically Signed   By: Gilmer Mor D.O.   On: 08/06/2023 12:33   CT ANGIO HEAD NECK W WO CM  Result Date: 08/05/2023 CLINICAL DATA:  Neuro deficit, acute, stroke suspected EXAM: CT ANGIOGRAPHY HEAD AND NECK WITH AND WITHOUT CONTRAST TECHNIQUE: Multidetector CT imaging of the head and neck was performed using the standard protocol during bolus administration of intravenous contrast. Multiplanar CT image reconstructions and MIPs were obtained to evaluate the vascular anatomy. Carotid stenosis measurements (when applicable) are obtained utilizing NASCET criteria, using the distal internal carotid diameter as the denominator. RADIATION DOSE REDUCTION: This exam was performed according to the departmental dose-optimization program which includes automated exposure control, adjustment of the mA and/or kV according to patient size and/or use of iterative reconstruction technique. CONTRAST:  75mL OMNIPAQUE IOHEXOL  350 MG/ML SOLN COMPARISON:  Brain MR 08/04/23 FINDINGS: CT HEAD FINDINGS Brain: No hemorrhage. No hydrocephalus. No extra-axial fluid  collection. No mass effect. No mass lesion. Chronic infarcts in the left caudate head. There is background of mild chronic microvascular ischemic change. Vascular: No hyperdense vessel or unexpected calcification. Skull: Normal. Negative for fracture or focal lesion. Sinuses/Orbits: No middle ear or mastoid effusion. Paranasal sinuses are clear. Bilateral lens replacement. Orbits are otherwise unremarkable. Other: There is anterior subluxation of the mandibular heads bilaterally without dislocation Review of the MIP images confirms the above findings CTA NECK FINDINGS Limitations: Motion degraded exam particularly at the level of the carotid bifurcation. Detailed assessment for the degree of vessel stenosis is limited at this level. Aortic arch: Standard branching. Imaged portion shows no evidence of aneurysm or dissection. No significant stenosis of the major arch vessel origins. Right carotid system: No evidence of dissection, stenosis (50% or greater), or occlusion. Left carotid system: No evidence of dissection, stenosis (50% or greater), or occlusion. Vertebral arteries: Codominant. No evidence of dissection, stenosis (50% or greater), or occlusion. Skeleton: There are osseous findings of DISH multilevel anterior bridging osteophytes. There is likely at least moderate spinal canal narrowing C5-C6 and C6-C7 Other neck: Right-sided central venous catheter in place. Upper chest: Patulous and fluid-filled esophagus. Review of the MIP images confirms the above findings CTA HEAD FINDINGS Anterior circulation: Mild narrowing of the supraclinoid ICA on the right (series 12, image 123) no proximal occlusion, aneurysm, or vascular malformation. Posterior circulation: No significant stenosis, proximal occlusion, aneurysm, or vascular malformation. Venous sinuses: As permitted by contrast timing, patent. Anatomic variants: None Review of the MIP images confirms the above findings IMPRESSION: 1. No acute intracranial  abnormality. Chronic infarcts in the left caudate head. Infarcts seen on recent brain MRI are not visualized due to CT technique. 2. No intracranial large vessel occlusion. Mild narrowing of the supraclinoid ICA on the right. 3. Note that assessment of the neck vasculature is markedly limited due to the degree of motion artifact. Within this limitation, no definite evidence of an alveolar or hemodynamically significant stenosis. 4. Patulous and fluid-filled esophagus, which could predispose to aspiration. 5. Osseous findings of DISH with likely at least moderate spinal canal narrowing at C5-C6 and C6-C7. Electronically Signed   By: Lorenza Cambridge M.D.   On: 08/05/2023 16:24     Medications:     aspirin EC  81 mg Oral Daily   atorvastatin  40 mg Oral Daily   Chlorhexidine Gluconate Cloth  6 each Topical Daily   clopidogrel  75 mg Oral Daily   ferrous sulfate  325 mg Oral Q breakfast   heparin injection (subcutaneous)  5,000 Units Subcutaneous Q8H   midodrine  2.5 mg Oral TID WC   sevelamer carbonate  800 mg Oral TID WC   acetaminophen **OR** acetaminophen (TYLENOL) oral liquid 160 mg/5 mL **OR** acetaminophen, fentaNYL (SUBLIMAZE) injection, ondansetron (ZOFRAN) IV, oxyCODONE-acetaminophen, senna-docusate  Assessment/ Plan:  Ms. Adaleah Trejo is a 72 y.o.  female with a past medical history of coronary artery disease, status post PCI, CHF with reduced ejection fraction, CKD, diabetes mellitus, hypertension, and end stage renal disease on hemodialysis. Patient presents to the ED from her dialysis center with left arm pain and weakness.  Patient has been admitted for Acute CVA (cerebrovascular accident) [facility]) [I63.9] Cerebrovascular accident (CVA), unspecified mechanism (HCC) [I63.9] CVA (cerebral vascular accident) (HCC) [I63.9]  [facility]/TTS/Rt AVG/71.0 kg  1.  End-stage renal disease on hemodialysis.  Receiving dialysis today, UF 1L as tolerated. Next treatment scheduled for  Thursday.  Will order Hep B total Core and chest xray assist in clinic transfer, if needed. Monitoring discharge plan.   2.  Acute stroke.  Brain MRI shows acute lacunar infarct and right numbness and posterior frontal lobe.Currently prescribed aspirin and Plavix.  Experiencing left-sided weakness.  Neurology consulted.   3. Anemia of chronic kidney disease Lab Results  Component Value Date   HGB 10.0 (L) 08/07/2023    Patient receives Mircera at outpatient clinic.  Hgb acceptable.   4. Secondary Hyperparathyroidism: with outpatient labs: PTH 776, phosphorus 7.4, calcium 8.1 on 07/10/23.    Lab Results  Component Value Date   PTH 396 (H) 01/08/2022   CALCIUM 8.9 08/07/2023   CAION 1.15 05/17/2023   PHOS 6.6 (H) 08/07/2023    Patient prescribed calcitriol and Cinacalcet outpatient.  Calcium at goal. Phosphorus elevated. Continue sevelamer with meals.    LOS: 2 Deanna [name] 11/12/202412:51 PM

## 2023-08-07 NOTE — Progress Notes (Signed)
Hemodialysis note  Received patient in bed to unit. Alert and oriented.  Informed consent signed and in chart.  Treatment initiated: 0830 Treatment completed: 1204  Patient tolerated well. Transported back to room, alert without acute distress.  Report given to patient's RN.   Access used: Right Chest HD Catheter Access issues: none  Total UF removed: 1L Medication(s) given:  none  Post HD weight: 71 kg   Wolfgang Phoenix [facility]

## 2023-08-07 NOTE — Plan of Care (Signed)
Progressing towards goals

## 2023-08-07 NOTE — Plan of Care (Signed)
  Problem: Education: Goal: Ability to describe self-care measures that may prevent or decrease complications (Diabetes Survival Skills Education) will improve Outcome: Progressing Goal: Individualized Educational Video(s) Outcome: Progressing   Problem: Ischemic Stroke/TIA Tissue Perfusion: Goal: Complications of ischemic stroke/TIA will be minimized Outcome: Progressing   Problem: Coping: Goal: Will verbalize positive feelings about self Outcome: Progressing Goal: Will identify appropriate support needs Outcome: Progressing

## 2023-08-07 NOTE — Progress Notes (Signed)
Physical Therapy Treatment Patient Details Name: Deanna [name] MRN: [id number] DOB: Jun 05, 1951 Today's Date: 08/07/2023   History of Present Illness Deanna [name] is a 72 y.o. female with medical history significant of CVA, ESRD on HD, extensive PAD s/p recent angioplasty and stent, HFrEF with last EF of 30-35% (April 2023), hypertension, hyperlipidemia, type 2 diabetes, CAD who presents to the ED due to left arm weakness. Brain MRI 08/04/23: Acute linear infarct in the right thalamus and posterior right frontal lobe.    PT Comments  Pt received in bed and agreed to PT session with encouragement. Pt reports that they are fatigued from dialysis Tx and have been experiencing 10/10 left sided pain. Pt performed bed mobility SUP and reported minimal dizziness while seated EOB, x2 STS with the use of SPC CGA, and amb in room/to the bathroom with the use of SPC CGA. VC necessary with standing balance and amb to ensure upright posture as well as appropriate SPC management. Pt tolerated Tx fair today and will continue to benefit from skilled PT sessions to improve functional mobility, activity tolerance, and strength following D/C.   If plan is discharge home, recommend the following: Assistance with cooking/housework;Assistance with feeding;Direct supervision/assist for medications management;Direct supervision/assist for financial management;Assist for transportation;Help with stairs or ramp for entrance;A little help with walking and/or transfers;A little help with bathing/dressing/bathroom   Can travel by private vehicle     Yes  Equipment Recommendations  Cane;Rolling walker (2 wheels)    Recommendations for Other Services       Precautions / Restrictions Precautions Precautions: Fall Precaution Comments: L hemi Restrictions Weight Bearing Restrictions: No     Mobility  Bed Mobility Overal bed mobility: Needs Assistance Bed Mobility: Supine to Sit, Sit to Supine     Supine to  sit: Supervision, Used rails, HOB elevated Sit to supine: Supervision, HOB elevated, Used rails   General bed mobility comments: Pt performed bed mobility SUP. VC necessary for body repositioning in bed.    Transfers Overall transfer level: Needs assistance Equipment used: Straight cane Transfers: Sit to/from Stand Sit to Stand: Contact guard assist           General transfer comment: VC necesary for instruction regarding SPC management.    Ambulation/Gait Ambulation/Gait assistance: Contact guard assist Gait Distance (Feet): 25 Feet Assistive device: Straight cane Gait Pattern/deviations: Trunk flexed Gait velocity: decreased     General Gait Details: decreased L hip/knee flexion during swing phase, decreased dorsiflexion LLE, decreased heel strike LLE, cuing for upright vs flexed posture. VC additionally necessary for [facility] management.   Stairs             Wheelchair Mobility     Tilt Bed    Modified Rankin (Stroke Patients Only)       Balance Overall balance assessment: Needs assistance Sitting-balance support: No upper extremity supported, Bilateral upper extremity supported, Feet supported Sitting balance-[name]: Fair     Standing balance support: During functional activity, Single extremity supported Standing balance-[name]: Fair                              Cognition Arousal: Alert Behavior During Therapy: WFL for tasks assessed/performed Overall Cognitive Status: Within Functional Limits for tasks assessed Area of Impairment: Safety/judgement                         Safety/Judgement: Decreased awareness of safety  General Comments: AOx4. Pt pleasant and with encouragement was willing to participate in PT session.        Exercises      General Comments        Pertinent Vitals/Pain Pain Assessment Pain Assessment: 0-10 Pain Score: 10-Worst pain ever Pain Location: Entire left side Pain  Descriptors / Indicators: Aching, Constant Pain Intervention(s): Monitored during session    Home Living                          Prior Function            PT Goals (current goals can now be found in the care plan section) Acute Rehab PT Goals Patient Stated Goal: I want to walk and get my strength back in my left arm and leg PT Goal Formulation: With patient Time For Goal Achievement: 08/19/23 Potential to Achieve Goals: Good Progress towards PT goals: Progressing toward goals    Frequency    Min 1X/week      PT Plan      Co-evaluation              AM-PAC PT "6 Clicks" Mobility   Outcome Measure  Help needed turning from your back to your side while in a flat bed without using bedrails?: A Little Help needed moving from lying on your back to sitting on the side of a flat bed without using bedrails?: A Little Help needed moving to and from a bed to a chair (including a wheelchair)?: A Little Help needed standing up from a chair using your arms (e.g., wheelchair or bedside chair)?: A Little Help needed to walk in hospital room?: A Little Help needed climbing 3-5 steps with a railing? : A Lot 6 Click Score: 17    End of Session Equipment Utilized During Treatment: Gait belt Activity Tolerance: Patient tolerated treatment well;Patient limited by fatigue Patient left: in bed;with call bell/phone within reach;with bed alarm set;with nursing/sitter in room Nurse Communication: Mobility status PT Visit Diagnosis: Unsteadiness on feet (R26.81);Other abnormalities of gait and mobility (R26.89);Muscle weakness (generalized) (M62.81);Difficulty in walking, not elsewhere classified (R26.2)     Time: 7829-5621 PT Time Calculation (min) (ACUTE ONLY): 17 min  Charges:    $Gait Training: 8-22 mins PT General Charges $$ ACUTE PT VISIT: 1 Visit                    Deanna [name] SPT, LAT, ATC  Deanna [name] 08/07/2023, 4:08 PM

## 2023-08-08 ENCOUNTER — Encounter: Payer: Self-pay | Admitting: Cardiology

## 2023-08-08 ENCOUNTER — Encounter
Admission: EM | Disposition: A | Discharge status: Skilled Nursing Facility | Payer: Self-pay | Source: Home / Self Care | Attending: Internal Medicine

## 2023-08-08 DIAGNOSIS — I639 Cerebral infarction, unspecified: Secondary | ICD-10-CM | POA: Diagnosis not present

## 2023-08-08 HISTORY — PX: LOOP RECORDER INSERTION: EP1214

## 2023-08-08 LAB — ECHOCARDIOGRAM LIMITED BUBBLE STUDY: S' Lateral: 3.3 cm

## 2023-08-08 SURGERY — LOOP RECORDER INSERTION
Anesthesia: LOCAL | Laterality: Left

## 2023-08-08 MED ORDER — LIDOCAINE-EPINEPHRINE 1 %-1:100000 IJ SOLN
INTRAMUSCULAR | Status: DC | PRN
  Administered 2023-08-08: 10 mL

## 2023-08-08 SURGICAL SUPPLY — 4 items
DEVICE PATIENT ASSISTANT (MISCELLANEOUS) IMPLANT
MONITOR MYCARELINK RELAY US (MISCELLANEOUS) IMPLANT
MONITOR REVEAL LINQ II (Prosthesis & Implant Heart) IMPLANT
PACK LOOP INSERTION (CUSTOM PROCEDURE TRAY) IMPLANT

## 2023-08-08 NOTE — Progress Notes (Signed)
Progress Note    Deanna [name]  OZD:[id number] DOB: 1951/05/06  DOA: 08/04/2023 PCP: [facility]      Brief Narrative:    Medical records reviewed and are as summarized below:  Deanna [name] is a 72 y.o. female  with medical history significant for CVA, ESRD on HD, extensive PAD s/p recent angioplasty and stent, HFrEF with last EF of 30-35% (April 2023), hypertension, hyperlipidemia, type 2 diabetes, CAD, DISH arthritis, C3 anterior osteophyte fracture (was recently cleared by neurosurgeon to discontinue cervical collar on 07/25/2023).  She  presented to the hospital with weakness in the left upper and left lower extremities.  She said her symptoms started about a week prior to admission .    Assessment/Plan:   Principal Problem:   Acute CVA (cerebrovascular accident) (HCC) Active Problems:   HFrEF (heart failure with reduced ejection fraction) (HCC)   End-stage renal disease on hemodialysis (HCC)   Type 2 diabetes mellitus without complications (HCC)   CVA (cerebral vascular accident) (HCC)   CAD (coronary artery disease)   PAD (peripheral artery disease) (HCC)   Closed nondisplaced fracture of third cervical vertebra (HCC)   Body mass index is 20.65 kg/m.   Cryptogenic acute stroke (infarcts in the right thalamus and posterior right frontal lobe): Continue aspirin and Plavix.  Continue Lipitor. No hemodynamically significant stenosis on carotid duplex ultrasound 2D echo showed EF estimated at 50 to 55% indeterminate LV diastolic function.  Echo was also reported as technically difficult study. S/p placement of loop recorder by EP cardiologist on 08/08/2023. PT and OT recommended discharge to SNF. Lipid panel: Total cholesterol 151, triglycerides 56, HDL 83, LDL 57. Hemoglobin A1c is 5.2.    Chronic systolic and diastolic CHF: Compensated. Echo on 08/06/2023 shows recovered EF. 2D echo in May 2023 showed EF estimated at 30 to 35%,  grade 1 diastolic dysfunction, mild to moderate MR   ESRD on hemodialysis: Follow-up with nephrologist for hemodialysis.     CAD, PAD: Recent percutaneous transluminal angioplasty of right peroneal artery and tibioperoneal trunk and right distal SFA and proximal popliteal artery on 07/23/2023. Continue aspirin and Plavix   Closed C3 vertebral fracture: She was evaluated by Dr. Katrinka Blazing, neurosurgeon, on 07/25/2023, who said it was okay to discontinue cervical collar on 07/25/2023   Chronic neck pain, DISH arthritis, chronic anemia   Awaiting placement to SNF    I went back to patient's room after her family requested to speak with me.  Plan was discussed with Deanna [name] (son) and Deanna [name] (patient's sister) at the bedside.  They met with the chaplain with plans to change HPOA from Deanna [name] (private caregiver) to Deanna [name] (son).      Diet Order             Diet heart healthy/carb modified Room service appropriate? Yes; Fluid consistency: Thin  Diet effective now                            Consultants: Neurologist Nephrologist  Procedures: None    Medications:    aspirin EC  81 mg Oral Daily   atorvastatin  40 mg Oral Daily   Chlorhexidine Gluconate Cloth  6 each Topical Daily   clopidogrel  75 mg Oral Daily   ferrous sulfate  325 mg Oral Q breakfast   heparin injection (subcutaneous)  5,000 Units Subcutaneous Q8H   midodrine  2.5 mg Oral TID WC  sevelamer carbonate  800 mg Oral TID WC   Continuous Infusions:   Anti-infectives (From admission, onward)    None              Family Communication/Anticipated D/C date and plan/Code Status   DVT prophylaxis: heparin injection 5,000 Units Start: 08/06/23 2200     Code Status: Full Code  Family Communication: None Disposition Plan: Plan to discharge to SNF   GEN: NAD SKIN: Warm and dry EYES: No pallor or icterus ENT: MMM CV: RRR PULM: CTA B ABD: soft, ND, NT, +BS CNS: AAO  x 3, mild left hemiparesis (LUE-4/5, LLE-3/5) EXT: No edema or tenderness        Subjective:   Interval events noted.  No new complaints.  She is still weak on the left side.  She thinks pain in the left shoulder also makes it difficult for her to move her left upper extremity.  Objective:    Vitals:   08/07/23 2026 08/07/23 2313 08/08/23 0816 08/08/23 1218  BP: (!) 166/75 (!) 149/62 (!) 124/57 (!) 149/73  Pulse: 81 90 86 92  Resp: 19 20 18 20   Temp: 98 F (36.7 C) 97.8 F (36.6 C) 98.4 F (36.9 C) 98.2 F (36.8 C)  TempSrc: Oral Oral Oral Oral  SpO2: 100% 100% 100% 98%  Weight:      Height:       No data found.  No intake or output data in the 24 hours ending 08/08/23 1342  Filed Weights   08/04/23 1001 08/07/23 0809 08/07/23 1206  Weight: 73 kg 71.9 kg 71 kg    Exam:  GEN: NAD SKIN: No rash EYES: EOMI ENT: MMM CV: RRR PULM: CTA B ABD: soft, ND, NT, +BS CNS: AAO x 3, left-sided hemiparesis LUE- 3+/5, LLE-2/5 EXT: No edema or tenderness       Data Reviewed:   I have personally reviewed following labs and imaging studies:  Labs: Labs show the following:   Basic Metabolic Panel: Recent Labs  Lab 08/04/23 1020 08/05/23 0907 08/06/23 1801 08/07/23 0836  NA 136 133* 134* 130*  K 3.7 4.1 5.2* 5.1  CL 95* 93* 95* 92*  CO2 29 27 27 26   GLUCOSE 145* 99 99 84  BUN 17 36* 59* 66*  CREATININE 3.19* 4.88* 6.75* 7.46*  CALCIUM 9.0 9.0 9.1 8.9  PHOS  --   --   --  6.6*   GFR Estimated Creatinine Clearance: 7.8 mL/min (A) (by C-G formula based on SCr of 7.46 mg/dL (H)). Liver Function Tests: Recent Labs  Lab 08/07/23 0836  ALBUMIN 3.5   No results for input(s): "LIPASE", "AMYLASE" in the last 168 hours. No results for input(s): "AMMONIA" in the last 168 hours. Coagulation profile No results for input(s): "INR", "PROTIME" in the last 168 hours.  CBC: Recent Labs  Lab 08/04/23 1020 08/05/23 0907 08/06/23 1801 08/07/23 0836  WBC 10.1  7.1 7.1 7.1  NEUTROABS  --  4.2  --   --   HGB 11.3* 10.3* 10.1* 10.0*  HCT 35.3* 31.7* 30.8* 31.3*  MCV 85.9 84.1 85.3 85.8  PLT 283 258 262 273   Cardiac Enzymes: No results for input(s): "CKTOTAL", "CKMB", "CKMBINDEX", "TROPONINI" in the last 168 hours. BNP (last 3 results) No results for input(s): "PROBNP" in the last 8760 hours. CBG: Recent Labs  Lab 08/05/23 1106 08/05/23 1628 08/05/23 2034 08/06/23 0806 08/06/23 1729  GLUCAP 94 100* 115* 85 101*   D-Dimer: No results for input(s): "DDIMER"  in the last 72 hours. Hgb A1c: No results for input(s): "HGBA1C" in the last 72 hours.  Lipid Profile: No results for input(s): "CHOL", "HDL", "LDLCALC", "TRIG", "CHOLHDL", "LDLDIRECT" in the last 72 hours.  Thyroid function studies: No results for input(s): "TSH", "T4TOTAL", "T3FREE", "THYROIDAB" in the last 72 hours.  Invalid input(s): "FREET3" Anemia work up: No results for input(s): "VITAMINB12", "FOLATE", "FERRITIN", "TIBC", "IRON", "RETICCTPCT" in the last 72 hours. Sepsis Labs: Recent Labs  Lab 08/04/23 1020 08/05/23 0907 08/06/23 1801 08/07/23 0836  WBC 10.1 7.1 7.1 7.1    Microbiology No results found for this or any previous visit (from the past 240 hour(s)).  Procedures and diagnostic studies:  EP PPM/ICD IMPLANT  Result Date: 08/08/2023 CONCLUSIONS:  1. Successful implantation of a implantable loop recorder for Cryptogenic stroke  2. No early apparent complications.   DG Chest 1 View  Result Date: 08/07/2023 CLINICAL DATA:  [id number].  End-stage chronic kidney disease. EXAM: CHEST  1 VIEW COMPARISON:  PA chest and rib series 05/17/2023. FINDINGS: The heart size and mediastinal contours are within normal limits. There are calcific plaques in the transverse aorta. Both lungs are clear. The visualized skeletal structures are intact with multilevel spinal bridging enthesopathy. Right IJ dialysis catheter again terminates in the mid to distal SVC. IMPRESSION: No  evidence of acute chest disease. Aortic atherosclerosis. Stable chest. Electronically Signed   By: Almira Bar M.D.   On: 08/07/2023 20:20   CT ANGIO HEAD NECK W WO CM  Result Date: 08/07/2023 CLINICAL DATA:  Initial evaluation for neuro deficit, stroke suspected. EXAM: CT ANGIOGRAPHY HEAD AND NECK WITH AND WITHOUT CONTRAST TECHNIQUE: Multidetector CT imaging of the head and neck was performed using the standard protocol during bolus administration of intravenous contrast. Multiplanar CT image reconstructions and MIPs were obtained to evaluate the vascular anatomy. Carotid stenosis measurements (when applicable) are obtained utilizing NASCET criteria, using the distal internal carotid diameter as the denominator. RADIATION DOSE REDUCTION: This exam was performed according to the departmental dose-optimization program which includes automated exposure control, adjustment of the mA and/or kV according to patient size and/or use of iterative reconstruction technique. CONTRAST:  75mL OMNIPAQUE IOHEXOL 350 MG/ML SOLN COMPARISON:  Comparison made with CTA and ultrasound from 08/05/2023. FINDINGS: CT HEAD FINDINGS Brain: Cerebral volume within normal limits. Moderate chronic microvascular ischemic disease. Previously identified small volume right cerebral infarcts not visible by CT. No other acute large vessel territory infarct. No acute intracranial hemorrhage. No mass lesion or midline shift. No hydrocephalus or extra-axial fluid collection. Vascular: No abnormal hyperdense vessel. Skull: Scalp soft tissues and calvarium demonstrate no new finding. Sinuses/Orbits: Globes and orbital soft tissues within normal limits. Paranasal sinuses are largely clear. Trace right mastoid effusion. Other: None. Review of the MIP images confirms the above findings CTA NECK FINDINGS Aortic arch: Aortic arch within normal limits for caliber with standard branch pattern. Mild aortic atherosclerosis. No stenosis about the origin the  great vessels. Right carotid system: Right common and internal carotid arteries are patent without dissection. Moderate calcified plaque about the right carotid bulb with associated stenosis of up to 50% by NASCET criteria. Left carotid system: Left common and internal carotid arteries are patent without dissection. Moderate calcified plaque about the left carotid bulb without hemodynamically significant greater than 50% stenosis. Vertebral arteries: Both vertebral arteries arise from subclavian arteries. No proximal subclavian artery stenosis. Vertebral arteries are patent without stenosis or dissection. 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 No worrisome osseous lesions. Patient is edentulous. Other neck: No other acute finding. Upper chest: No other acute finding. Review of the MIP images confirms the above findings CTA HEAD FINDINGS Anterior circulation: Atheromatous change about the carotid siphons bilaterally, right worse than left. Resultant moderate to severe stenosis at the supraclinoid right ICA, with up to moderate narrowing at the contralateral left paraclinoid ICA. A1 segments patent bilaterally. Normal anterior communicating artery complex. Anterior cerebral arteries patent without stenosis. No M1 stenosis or occlusion. Distal MCA branches perfused and symmetric. Posterior circulation: Both V4 segments widely patent. Right PICA patent. Left PICA origin not well seen. Basilar widely patent without stenosis. Superior cerebral arteries patent bilaterally. Both PCAs primarily supplied via the basilar. Short-segment mild proximal left P2 stenosis (series 12, image 128). PCAs otherwise patent to their distal aspects without significant stenosis. Venous sinuses: Grossly patent allowing for timing the contrast bolus. Anatomic variants: None significant.  No aneurysm. Review of the MIP images confirms the above findings IMPRESSION: CT HEAD:  1. No acute intracranial abnormality. Previously identified small volume right cerebral infarcts not visible by CT. 2. Moderate chronic microvascular ischemic disease. CTA HEAD AND NECK: 1. Negative CTA for large vessel occlusion or other emergent finding. 2. Atheromatous change about the carotid siphons bilaterally, right worse than left, with resultant moderate to severe stenosis at the supraclinoid right ICA, with up to moderate narrowing at the contralateral left paraclinoid ICA. 3. Moderate calcified plaque about the right carotid bulb with associated stenosis of up to 50% by NASCET criteria. 4.  Aortic Atherosclerosis (ICD10-I70.0). Electronically Signed   By: Rise Mu M.D.   On: 08/07/2023 03:32               LOS: 3 days   Rondell Pardon  [facility]   Pager on [email or web address]. If 7PM-7AM, please contact night-coverage at [email or web address]     08/08/2023, 1:42 PM

## 2023-08-08 NOTE — Care Management Important Message (Signed)
Important Message  Patient Details  Name: Deanna [name] MRN: [id number] Date of Birth: [birth year]   Important Message Given:  N/A - LOS <3 / Initial given by admissions     [name] [name] 08/08/2023, 9:47 AM

## 2023-08-08 NOTE — Progress Notes (Signed)
Physical Therapy Treatment Patient Details Name: Deanna [name] MRN: [id number] DOB: [birth year] Today's Date: 08/08/2023   History of Present Illness Deanna [name] is a 72 y.o. female with medical history significant of CVA, ESRD on HD, extensive PAD s/p recent angioplasty and stent, HFrEF with last EF of 30-35% (April 2023), hypertension, hyperlipidemia, type 2 diabetes, CAD who presents to the ED due to left arm weakness. Brain MRI 08/04/23: Acute linear infarct in the right thalamus and posterior right frontal lobe.    PT Comments  Pt received in bed with son/sister/RN at bedside and agreed to PT session. Pt reports that she currently does not have any pain and is doing better in comparison to yesterday. Pt performed bed mobility SUP, STS with the use of RW (2wheels) CGA, and amb in hallway with chair follow CGA. Pt amb ~160ft before needing sit due to fatigue. Vitals after amb read 97% SpO2 on RA and 94 bpm while seated. VC throughout session necessary for posture correction and RW management. Pt tolerated Tx well today and will continue to benefit from skilled PT sessions to improve functional mobility, strength, and activity tolerance to maximize safety/IND following D/C.   If plan is discharge home, recommend the following: Assistance with cooking/housework;Assistance with feeding;Direct supervision/assist for medications management;Direct supervision/assist for financial management;Assist for transportation;Help with stairs or ramp for entrance;A little help with walking and/or transfers;A little help with bathing/dressing/bathroom   Can travel by private vehicle     Yes  Equipment Recommendations  Cane;Rolling walker (2 wheels)    Recommendations for Other Services       Precautions / Restrictions Precautions Precautions: Fall Precaution Comments: L hemi Restrictions Weight Bearing Restrictions: No     Mobility  Bed Mobility Overal bed mobility: Needs Assistance Bed  Mobility: Supine to Sit, Sit to Supine     Supine to sit: Supervision, Used rails, HOB elevated Sit to supine: Supervision, HOB elevated, Used rails   General bed mobility comments: Pt performed bed mobility SUP. VC necessary for body repositioning in bed.    Transfers Overall transfer level: Needs assistance Equipment used: Rolling walker (2 wheels) Transfers: Sit to/from Stand Sit to Stand: Contact guard assist           General transfer comment: VC necesary for instruction regarding RW management.    Ambulation/Gait Ambulation/Gait assistance: Contact guard assist Gait Distance (Feet): 100 Feet Assistive device: Rolling walker (2 wheels) Gait Pattern/deviations: Trunk flexed Gait velocity: Consistent pace     General Gait Details: decreased L hip/knee flexion during swing phase, decreased dorsiflexion LLE, decreased heel strike LLE, cuing for upright vs flexed posture. VC additionally necessary for RW management.   Stairs             Wheelchair Mobility     Tilt Bed    Modified Rankin (Stroke Patients Only)       Balance Overall balance assessment: Needs assistance Sitting-balance support: No upper extremity supported, Bilateral upper extremity supported, Feet supported Sitting balance-[name]: Good     Standing balance support: During functional activity, Bilateral upper extremity supported Standing balance-[name]: Fair                              Cognition Arousal: Alert Behavior During Therapy: WFL for tasks assessed/performed Overall Cognitive Status: Within Functional Limits for tasks assessed Area of Impairment: Safety/judgement  Safety/Judgement: Decreased awareness of safety     General Comments: AOx4. Pt pleasant and willing to participate in PT session.        Exercises      General Comments        Pertinent Vitals/Pain Pain Assessment Pain Assessment: 0-10 Pain  Score: 0-No pain Pain Location: Entire left side Pain Descriptors / Indicators: Aching, Constant Pain Intervention(s): Monitored during session    Home Living                          Prior Function            PT Goals (current goals can now be found in the care plan section) Acute Rehab PT Goals Patient Stated Goal: I want to walk and get my strength back in my left arm and leg PT Goal Formulation: With patient Time For Goal Achievement: 08/19/23 Potential to Achieve Goals: Good Progress towards PT goals: Progressing toward goals    Frequency    Min 1X/week      PT Plan      Co-evaluation              AM-PAC PT "6 Clicks" Mobility   Outcome Measure  Help needed turning from your back to your side while in a flat bed without using bedrails?: A Little Help needed moving from lying on your back to sitting on the side of a flat bed without using bedrails?: A Little Help needed moving to and from a bed to a chair (including a wheelchair)?: A Little Help needed standing up from a chair using your arms (e.g., wheelchair or bedside chair)?: A Little Help needed to walk in hospital room?: A Little Help needed climbing 3-5 steps with a railing? : A Lot 6 Click Score: 17    End of Session Equipment Utilized During Treatment: Gait belt Activity Tolerance: Patient tolerated treatment well;Patient limited by fatigue Patient left: in bed;with call bell/phone within reach;with bed alarm set Nurse Communication: Mobility status PT Visit Diagnosis: Unsteadiness on feet (R26.81);Other abnormalities of gait and mobility (R26.89);Muscle weakness (generalized) (M62.81);Difficulty in walking, not elsewhere classified (R26.2)     Time: 1610-9604 PT Time Calculation (min) (ACUTE ONLY): 19 min  Charges:    $Gait Training: 8-22 mins PT General Charges $$ ACUTE PT VISIT: 1 Visit                     Karessa Onorato [place] SPT, LAT, ATC   Akiko Schexnider  [place] 08/08/2023, 3:22 PM

## 2023-08-08 NOTE — Progress Notes (Signed)
OT Cancellation Note  Patient Details Name: Deanna [name] MRN: [id number] DOB: 1951-06-12   Cancelled Treatment:    Reason Eval/Treat Not Completed: Patient at procedure or test/ unavailable. Pt off unit for planned procedure. Will re-attempts as able.   Kathie Dike, M.S. OTR/L  08/08/23, 10:37 AM  ascom [phone]

## 2023-08-08 NOTE — Progress Notes (Signed)
   08/08/23 1500  Spiritual Encounters  Type of Visit Follow up  Care provided to: Patient  Reason for visit Advance directives  OnCall Visit No   Chaplain came back with notary and witness to complete ADR. Chaplain sat and talked with the patient about her home life and the struggles that she has been facing. Chaplain will continue to follow up with the patient.

## 2023-08-08 NOTE — Discharge Instructions (Signed)
Care After Your Loop Recorder  You have a [employer] Loop Recorder   Monitor your cardiac device site for redness, swelling, and drainage. Call the device clinic at [phone] if you experience these symptoms or fever/chills.  If you notice bleeding from your site, hold firm, but gently pressure with two fingers for 5 minutes. Dried blood on the steri-strips when removing the outer bandage is normal.   Keep the large square bandage on your site for 24 hours and then you may remove it yourself. Keep the steri-strips underneath in place.   You may shower after 72 hours / 3 days from your procedure with the steri-strips in place. They will usually fall off on their own, or may be removed after 10 days. Pat dry.   Avoid lotions, ointments, or perfumes over your incision until it is well-healed.  Please do not submerge in water until your site is completely healed.   Your device is MRI compatible.   Remote monitoring is used to monitor your cardiac device from home. This monitoring is scheduled every month by our office. It allows [place] to keep an eye on the function of your device to ensure it is working properly.  Resume your hemodialysis Tuesday Thursday Saturday as before

## 2023-08-08 NOTE — Plan of Care (Signed)
Progressing towards goals

## 2023-08-08 NOTE — Progress Notes (Addendum)
Patient refused her VS x 3 this AM. Refused CHG bath x 3.

## 2023-08-08 NOTE — Progress Notes (Signed)
   08/08/23 1400  Spiritual Encounters  Type of Visit Follow up  Care provided to: Pt and family  Referral source Family  Reason for visit Advance directives  OnCall Visit No   Chaplain went to do a follow up visit for an ADR. There were no notary's or witnesses available to fill out the paperwork. Chaplain will try again later to get a notary and witness for the patient.

## 2023-08-08 NOTE — Progress Notes (Addendum)
  Chaplain On-Call responded to page from [employer] who reported the patient and family want to discuss Advance Directives.  Chaplain met the patient and her son and her sister.  The patient stated that she wants to create a new Advance Directive to replace an existing one. Chaplain provided the AD documents and education about process for completion in the hospital. The family stated that they do not know if the holder of the existing AD will tear it up. Chaplain informed them that the new one will be entered into patient's medical record and will be the current expression of her wishes.  Patient and family stated their understanding.  Chaplain Evelena Peat M.Div., [facility]

## 2023-08-08 NOTE — Consult Note (Signed)
ELECTROPHYSIOLOGY CONSULT NOTE  Patient ID: Deanna [name] Last MRN: [id number], DOB/AGE: 10-24-1950   Admit date: 08/04/2023 Date of Consult: 08/08/2023  Primary Physician: [facility] Primary Cardiologist: Rudean Haskell, MD  Primary Electrophysiologist: New to Lanier Prude, MD  Reason for Consultation: Cryptogenic stroke; recommendations regarding Implantable Loop Recorder Insurance: [employer]  History of Present Illness Deanna [name] is a 72 y.o. female with PMH of HFrEF, CAD, PAD, ESRD on HD, HLD, HTN, T2DM, recent C3 fracture, hyperparathyroidism (secondary to renal disease), anemia who presented to [facility] with numbness and weakness for the past week, but worsened significantly during dialysis 11/9.  The patient was admitted on 08/04/2023 with left-sided weakness.  MRI revels two separate infarcts in R hemisphere.   EP has been asked to evaluate Deanna [name] for placement of an implantable loop recorder to monitor for atrial fibrillation by Dr  Iver Nestle .     She has undergone workup for stroke including:  CTA head/neck - negative for large-vessel occlusion; mod-severe stenosis of the right ICA Carotid ultrasound - no hemodynamicly significant stenosis MRI brain - acute linear infarct in the R thalamus and posterior R frontal lobe TTE - formal report pending, prelim: normal EF, mild LVH;  prev TTE 12/2021 with HFrEF (30-35%), mildly dilated LA A1C and lipids at goal  She has been working with PT/OT - eval limited by pain. She has continued to receive HD, last session 11/12 The patient has been monitored on telemetry which has demonstrated sinus rhythm with no arrhythmias.  Inpatient stroke work-up will not require a TEE per Neurology.   Echocardiogram as above. Lab work is reviewed.  Prior to admission, the patient denies chest pain, shortness of breath, dizziness, palpitations, or syncope.  She is recovering from her stroke with plans to rehab  at SNF  at discharge.  Allergies, Past Medical, Surgical, Social, and Family Histories have been reviewed and are referenced here-in when relevant for medical decision making.   Inpatient Medications:   aspirin EC  81 mg Oral Daily   atorvastatin  40 mg Oral Daily   Chlorhexidine Gluconate Cloth  6 each Topical Daily   clopidogrel  75 mg Oral Daily   ferrous sulfate  325 mg Oral Q breakfast   heparin injection (subcutaneous)  5,000 Units Subcutaneous Q8H   midodrine  2.5 mg Oral TID WC   sevelamer carbonate  800 mg Oral TID WC    Physical Exam: Vitals:   08/07/23 1251 08/07/23 1600 08/07/23 2026 08/07/23 2313  BP: 128/62 126/65 (!) 166/75 (!) 149/62  Pulse: 82 85 81 90  Resp: 18 16 19 20   Temp: 98 F (36.7 C) 98.2 F (36.8 C) 98 F (36.7 C) 97.8 F (36.6 C)  TempSrc: Oral  Oral Oral  SpO2: 100% 99% 100% 100%  Weight:      Height:        GEN- NAD. A&O x 3. Normal affect. HEENT: Normocephalic, atraumatic Lungs- CTAB, Normal effort.  Heart- Regular rate and rhythm rate and rhythm. No M/G/R.  Extremities- No peripheral edema. no clubbing or cyanosis Skin- warm and dry, no rash or lesion.    12-lead ECG  (personally reviewed) All prior EKG's in EPIC reviewed with no documented atrial fibrillation  Telemetry NSR (personally reviewed)  Assessment and Plan:  #) Cryptogenic stroke The patient presents with cryptogenic stroke.  The patient does not have a TEE planned for this AM.  I spoke at length with the patient about monitoring  for afib with an implantable loop recorder.  Risks, benefits, and alteratives to implantable loop recorder were discussed with the patient today.   Costs were discusssedj. At this time, the patient is very clear in their decision to proceed with implantable loop recorder.   Wound care was reviewed with the patient (keep incision clean and dry for 3 days). Please call with questions.    #) HFimpEF #) ESRD on HD Awaiting formal echo report,  prelim with recovered LVEF Warm and euvolemic on exam GDMT limited by ESRD Fluid managed by HD  #) CAD Denies ischemic s/s Cont plavix and ASA Continue 40mg  lipitor   Sherie Don, NP 08/08/2023 7:39 AM    -----------------------------  I have seen, examined the patient, and reviewed the above assessment and plan.    Deanna [name] is a pleasant 72yo woman admitted with cryptogenic stroke. No history of AF.   GEN: No acute distress.   Cardiac: RRR, no murmurs, rubs, or gallops.  Psych: Normal affect   Tele personally reviwed and shows no AF.  #Cryptogenic Stroke Pathophysiology of cryptogenic stroke was discussed in detail. I discussed the role of loop recorder monitoring in patients who have suffered a CVA/TIA. There has been no evidence of AF thus far in the patient's evaluation. Loop recorder monitors were discussed in detail including the implant procedure and its risks. I discussed the monthly monitoring costs associated with loop recorder monitoring. The patient would like to proceed with ILR implant.  Plan for ILR today.    Lanier Prude, MD 08/08/2023 10:22 AM

## 2023-08-08 NOTE — Progress Notes (Signed)
[facility]  ROUNDING NOTE   Subjective:   Deanna [name]  is a 72 year old African-American female with a past medical history of coronary artery disease, status post PCI, CHF with reduced ejection fraction, CKD, diabetes mellitus, hypertension, and end stage renal disease on hemodialysis. Patient presents to the ED from her dialysis center with left arm pain and weakness.  Patient has been admitted for Acute CVA (cerebrovascular accident) [facility]) [I63.9] Cerebrovascular accident (CVA), unspecified mechanism (HCC) [I63.9] CVA (cerebral vascular accident) [facility]) [I63.9]  Patient is known to our practice and receives outpatient dialysis treatments at [facility] on a TTS schedule, supervised by Dr. Cherylann Ratel.   Update Patient seen resting in bed States she is tired today Did not rest well overnight Awaiting breakfast Denies pain    Objective:  Vital signs in last 24 hours:  Temp:  [97.8 F (36.6 C)-98.4 F (36.9 C)] 98.2 F (36.8 C) (11/13 1218) Pulse Rate:  [81-92] 92 (11/13 1218) Resp:  [16-20] 20 (11/13 1218) BP: (124-166)/(57-75) 149/73 (11/13 1218) SpO2:  [98 %-100 %] 98 % (11/13 1218)  Weight change:  Filed Weights   08/04/23 1001 08/07/23 0809 08/07/23 1206  Weight: 73 kg 71.9 kg 71 kg    Intake/Output: I/O last 3 completed shifts: In: -  Out: 1000 [Other:1000]   Intake/Output this shift:  No intake/output data recorded.  Physical Exam: General: NAD  Head: Normocephalic, atraumatic. Moist oral mucosal membranes  Eyes: Anicteric  Lungs:  Clear to auscultation, room air  Heart: Regular rate and rhythm  Abdomen:  Soft, nontender  Extremities: Trace peripheral edema.  Neurologic: Alert, moving all four extremities  Skin: No lesions  Access: Rt permcath, AVG placed on 05/17/23     Basic Metabolic Panel: Recent Labs  Lab 08/04/23 1020 08/05/23 0907 08/06/23 1801 08/07/23 0836  NA 136 133* 134* 130*  K 3.7 4.1 5.2* 5.1  CL 95* 93* 95*  92*  CO2 29 27 27 26   GLUCOSE 145* 99 99 84  BUN 17 36* 59* 66*  CREATININE 3.19* 4.88* 6.75* 7.46*  CALCIUM 9.0 9.0 9.1 8.9  PHOS  --   --   --  6.6*    Liver Function Tests: Recent Labs  Lab 08/07/23 0836  ALBUMIN 3.5   No results for input(s): "LIPASE", "AMYLASE" in the last 168 hours. No results for input(s): "AMMONIA" in the last 168 hours.  CBC: Recent Labs  Lab 08/04/23 1020 08/05/23 0907 08/06/23 1801 08/07/23 0836  WBC 10.1 7.1 7.1 7.1  NEUTROABS  --  4.2  --   --   HGB 11.3* 10.3* 10.1* 10.0*  HCT 35.3* 31.7* 30.8* 31.3*  MCV 85.9 84.1 85.3 85.8  PLT 283 258 262 273    Cardiac Enzymes: No results for input(s): "CKTOTAL", "CKMB", "CKMBINDEX", "TROPONINI" in the last 168 hours.  BNP: Invalid input(s): "POCBNP"  CBG: Recent Labs  Lab 08/05/23 1106 08/05/23 1628 08/05/23 2034 08/06/23 0806 08/06/23 1729  GLUCAP 94 100* 115* 85 101*    Microbiology: Results for orders placed or performed during the hospital encounter of 01/20/22  Blood culture (routine x 2)     Status: None   Collection Time: 01/20/22  7:22 PM   Specimen: BLOOD  Result Value Ref Range Status   Specimen Description BLOOD BLOOD RIGHT HAND  Final   Special Requests   Final    BOTTLES DRAWN AEROBIC AND ANAEROBIC Blood Culture results may not be optimal due to an inadequate volume of blood received in culture  bottles   Culture   Final    NO GROWTH 5 DAYS Performed at [facility], [address] [place], [place] [address]    Report Status 01/25/2022 FINAL  Final  Blood culture (routine x 2)     Status: None   Collection Time: 01/20/22  7:22 PM   Specimen: BLOOD  Result Value Ref Range Status   Specimen Description BLOOD BLOOD LEFT HAND  Final   Special Requests   Final    BOTTLES DRAWN AEROBIC AND ANAEROBIC Blood Culture results may not be optimal due to an inadequate volume of blood received in culture bottles   Culture   Final    NO GROWTH 5 DAYS Performed at  [facility], [address] [place], [place] [address]    Report Status 01/25/2022 FINAL  Final  Resp Panel by RT-PCR (Flu A&B, Covid) Nasopharyngeal Swab     Status: None   Collection Time: 01/20/22  7:28 PM   Specimen: Nasopharyngeal Swab; Nasopharyngeal(NP) swabs in vial transport medium  Result Value Ref Range Status   SARS Coronavirus 2 by RT PCR NEGATIVE NEGATIVE Final    Comment: (NOTE) SARS-CoV-2 target nucleic acids are NOT DETECTED.  The SARS-CoV-2 RNA is generally detectable in upper respiratory specimens during the acute phase of infection. The lowest concentration of SARS-CoV-2 viral copies this assay can detect is 138 copies/mL. A negative result does not preclude SARS-Cov-2 infection and should not be used as the sole basis for treatment or other patient management decisions. A negative result may occur with  improper specimen collection/handling, submission of specimen other than nasopharyngeal swab, presence of viral mutation(s) within the areas targeted by this assay, and inadequate number of viral copies(<138 copies/mL). A negative result must be combined with clinical observations, patient history, and epidemiological information. The expected result is Negative.  Fact Sheet for Patients:  BloggerCourse.com  Fact Sheet for Healthcare Providers:  SeriousBroker.it  This test is no t yet approved or cleared by the [place] FDA and  has been authorized for detection and/or diagnosis of SARS-CoV-2 by FDA under an Emergency Use Authorization (EUA). This EUA will remain  in effect (meaning this test can be used) for the duration of the COVID-19 declaration under Section 564(b)(1) of the Act, 21 U.S.C.section 360bbb-3(b)(1), unless the authorization is terminated  or revoked sooner.       Influenza A by PCR NEGATIVE NEGATIVE Final   Influenza B by PCR NEGATIVE NEGATIVE Final    Comment: (NOTE) The  Xpert Xpress SARS-CoV-2/FLU/RSV plus assay is intended as an aid in the diagnosis of influenza from Nasopharyngeal swab specimens and should not be used as a sole basis for treatment. Nasal washings and aspirates are unacceptable for Xpert Xpress SARS-CoV-2/FLU/RSV testing.  Fact Sheet for Patients: BloggerCourse.com  Fact Sheet for Healthcare Providers: SeriousBroker.it  This test is not yet approved or cleared by the [place] FDA and has been authorized for detection and/or diagnosis of SARS-CoV-2 by FDA under an Emergency Use Authorization (EUA). This EUA will remain in effect (meaning this test can be used) for the duration of the COVID-19 declaration under Section 564(b)(1) of the Act, 21 U.S.C. section 360bbb-3(b)(1), unless the authorization is terminated or revoked.  Performed at [facility], [address] [place], [place] [address]     Coagulation Studies: No results for input(s): "LABPROT", "INR" in the last 72 hours.  Urinalysis: No results for input(s): "COLORURINE", "LABSPEC", "PHURINE", "GLUCOSEU", "HGBUR", "BILIRUBINUR", "KETONESUR", "PROTEINUR", "UROBILINOGEN", "NITRITE", "  LEUKOCYTESUR" in the last 72 hours.  Invalid input(s): "APPERANCEUR"    Imaging: EP PPM/ICD IMPLANT  Result Date: 08/08/2023 CONCLUSIONS:  1. Successful implantation of a implantable loop recorder for Cryptogenic stroke  2. No early apparent complications.   DG Chest 1 View  Result Date: 08/07/2023 CLINICAL DATA:  [id number].  End-stage chronic kidney disease. EXAM: CHEST  1 VIEW COMPARISON:  PA chest and rib series 05/17/2023. FINDINGS: The heart size and mediastinal contours are within normal limits. There are calcific plaques in the transverse aorta. Both lungs are clear. The visualized skeletal structures are intact with multilevel spinal bridging enthesopathy. Right IJ dialysis catheter again terminates in the mid to  distal SVC. IMPRESSION: No evidence of acute chest disease. Aortic atherosclerosis. Stable chest. Electronically Signed   By: Almira Bar M.D.   On: 08/07/2023 20:20   CT ANGIO HEAD NECK W WO CM  Result Date: 08/07/2023 CLINICAL DATA:  Initial evaluation for neuro deficit, stroke suspected. EXAM: CT ANGIOGRAPHY HEAD AND NECK WITH AND WITHOUT CONTRAST TECHNIQUE: Multidetector CT imaging of the head and neck was performed using the standard protocol during bolus administration of intravenous contrast. Multiplanar CT image reconstructions and MIPs were obtained to evaluate the vascular anatomy. Carotid stenosis measurements (when applicable) are obtained utilizing NASCET criteria, using the distal internal carotid diameter as the denominator. RADIATION DOSE REDUCTION: This exam was performed according to the departmental dose-optimization program which includes automated exposure control, adjustment of the mA and/or kV according to patient size and/or use of iterative reconstruction technique. CONTRAST:  75mL OMNIPAQUE IOHEXOL 350 MG/ML SOLN COMPARISON:  Comparison made with CTA and ultrasound from 08/05/2023. FINDINGS: CT HEAD FINDINGS Brain: Cerebral volume within normal limits. Moderate chronic microvascular ischemic disease. Previously identified small volume right cerebral infarcts not visible by CT. No other acute large vessel territory infarct. No acute intracranial hemorrhage. No mass lesion or midline shift. No hydrocephalus or extra-axial fluid collection. Vascular: No abnormal hyperdense vessel. Skull: Scalp soft tissues and calvarium demonstrate no new finding. Sinuses/Orbits: Globes and orbital soft tissues within normal limits. Paranasal sinuses are largely clear. Trace right mastoid effusion. Other: None. Review of the MIP images confirms the above findings CTA NECK FINDINGS Aortic arch: Aortic arch within normal limits for caliber with standard branch pattern. Mild aortic atherosclerosis. No  stenosis about the origin the great vessels. Right carotid system: Right common and internal carotid arteries are patent without dissection. Moderate calcified plaque about the right carotid bulb with associated stenosis of up to 50% by NASCET criteria. Left carotid system: Left common and internal carotid arteries are patent without dissection. Moderate calcified plaque about the left carotid bulb without hemodynamically significant greater than 50% stenosis. Vertebral arteries: Both vertebral arteries arise from subclavian arteries. No proximal subclavian artery stenosis. Vertebral arteries are patent without stenosis or dissection. 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 No worrisome osseous lesions. Patient is edentulous. Other neck: No other acute finding. Upper chest: No other acute finding. Review of the MIP images confirms the above findings CTA HEAD FINDINGS Anterior circulation: Atheromatous change about the carotid siphons bilaterally, right worse than left. Resultant moderate to severe stenosis at the supraclinoid right ICA, with up to moderate narrowing at the contralateral left paraclinoid ICA. A1 segments patent bilaterally. Normal anterior communicating artery complex. Anterior cerebral arteries patent without stenosis. No M1 stenosis or occlusion. Distal MCA branches perfused and symmetric. Posterior circulation: Both V4 segments widely patent. Right PICA patent. Left PICA  origin not well seen. Basilar widely patent without stenosis. Superior cerebral arteries patent bilaterally. Both PCAs primarily supplied via the basilar. Short-segment mild proximal left P2 stenosis (series 12, image 128). PCAs otherwise patent to their distal aspects without significant stenosis. Venous sinuses: Grossly patent allowing for timing the contrast bolus. Anatomic variants: None significant.  No aneurysm. Review of the MIP images confirms the above  findings IMPRESSION: CT HEAD: 1. No acute intracranial abnormality. Previously identified small volume right cerebral infarcts not visible by CT. 2. Moderate chronic microvascular ischemic disease. CTA HEAD AND NECK: 1. Negative CTA for large vessel occlusion or other emergent finding. 2. Atheromatous change about the carotid siphons bilaterally, right worse than left, with resultant moderate to severe stenosis at the supraclinoid right ICA, with up to moderate narrowing at the contralateral left paraclinoid ICA. 3. Moderate calcified plaque about the right carotid bulb with associated stenosis of up to 50% by NASCET criteria. 4.  Aortic Atherosclerosis (ICD10-I70.0). Electronically Signed   By: Rise Mu M.D.   On: 08/07/2023 03:32     Medications:     aspirin EC  81 mg Oral Daily   atorvastatin  40 mg Oral Daily   Chlorhexidine Gluconate Cloth  6 each Topical Daily   clopidogrel  75 mg Oral Daily   ferrous sulfate  325 mg Oral Q breakfast   heparin injection (subcutaneous)  5,000 Units Subcutaneous Q8H   midodrine  2.5 mg Oral TID WC   sevelamer carbonate  800 mg Oral TID WC   acetaminophen **OR** acetaminophen (TYLENOL) oral liquid 160 mg/5 mL **OR** acetaminophen, fentaNYL (SUBLIMAZE) injection, ondansetron (ZOFRAN) IV, oxyCODONE-acetaminophen, senna-docusate  Assessment/ Plan:  Deanna [name] is a 72 y.o.  female with a past medical history of coronary artery disease, status post PCI, CHF with reduced ejection fraction, CKD, diabetes mellitus, hypertension, and end stage renal disease on hemodialysis. Patient presents to the ED from her dialysis center with left arm pain and weakness.  Patient has been admitted for Acute CVA (cerebrovascular accident) [facility]) [I63.9] Cerebrovascular accident (CVA), unspecified mechanism (HCC) [I63.9] CVA (cerebral vascular accident) (HCC) [I63.9]  [facility]/TTS/Rt AVG/71.0 kg  1.  End-stage renal disease on hemodialysis.   Dialysis received yesterday, UF 1L as tolerated. Next treatment scheduled for Thursday.    2.  Acute stroke.  Brain MRI shows acute lacunar infarct and right numbness and posterior frontal lobe.Currently prescribed aspirin and Plavix.  Experiencing left-sided weakness.  Neurology consulted. Will receive loop recorder today  3. Anemia of chronic kidney disease Lab Results  Component Value Date   HGB 10.0 (L) 08/07/2023    Patient receives Mircera at outpatient clinic.  Hgb within desired range  4. Secondary Hyperparathyroidism: with outpatient labs: PTH 776, phosphorus 7.4, calcium 8.1 on 07/10/23.    Lab Results  Component Value Date   PTH 396 (H) 01/08/2022   CALCIUM 8.9 08/07/2023   CAION 1.15 05/17/2023   PHOS 6.6 (H) 08/07/2023    Patient prescribed calcitriol and Cinacalcet outpatient. Hyperphosphatemia noted. Continue sevelamer with meals.    LOS: 3 Ellamay Fors 11/13/202412:41 PM

## 2023-08-08 NOTE — TOC Progression Note (Addendum)
Transition of Care [facility]) - Progression Note    Patient Details  Name: Deanna [name] MRN: [id number] Date of Birth: 25-Nov-1950  Transition of Care [facility]) CM/SW Contact  Darolyn Rua, [place] Phone Number: 08/08/2023, 11:14 AM  Clinical Narrative:     Update 12:17 pm: CSW spoke with family at bedside, they stated being patient's son and sister  Dois Davenport [phone]). They report that they want patient in snf in [place] then an alf, they have not identified where and have not started this process. Upon discussion they discover barriers in transport to [place] for snf and have identified patient can go to snf locally to get stronger for them to transport to [place] for alf, agreeable for referral to [facility] to assist with placement, referral given to [facility]. They inquired as medicaid change of county with patients CAP program, CSW informed them they will need to call DSS and talk with patient's social work through them. They expressed hesitation in doing this and stated that's why they came here today to ask this CSW, CSW informed them again that they will need to discuss with dss social worker and that this CSW was a Child psychotherapist with the hospital not DSS.  CSW printed medicare.gov star ratings for [facility], provided phone number for them to let csw know which facility they choose. They choose [facility], [facility] informed, Marchelle Folks HD coordinator informed. Insurance auth started with [employer].     CSW met with patient at bedside to provide bed offers, bed offers provided, patient prefers to stay in [place] so [facility] or [facility], patient was leaving for procedure and asked CSW to follow up later with her family on choice.    Expected Discharge Plan: Skilled Nursing Facility Barriers to Discharge: Continued Medical Work up  Expected Discharge Plan and Services     Post Acute Care Choice: Skilled Nursing Facility Living arrangements for the past 2 months: Single  Family Home                                       Social Determinants of Health (SDOH) Interventions SDOH Screenings   Food Insecurity: No Food Insecurity (08/04/2023)  Housing: Low Risk  (08/04/2023)  Transportation Needs: No Transportation Needs (08/04/2023)  Utilities: Not At Risk (08/04/2023)  Tobacco Use: Low Risk  (08/07/2023)    Readmission Risk Interventions    01/16/2022    3:45 PM 12/29/2021    2:33 PM  Readmission Risk Prevention Plan  Transportation Screening Complete Complete  PCP or Specialist Appt within 5-7 Days  Complete  Home Care Screening  Complete  Medication Review (RN CM)  Complete  Medication Review (RN Care Manager) Complete   PCP or Specialist appointment within 3-5 days of discharge Complete   HRI or Home Care Consult Complete   SW Recovery Care/Counseling Consult Complete   Palliative Care Screening Not Applicable   Skilled Nursing Facility Not Applicable

## 2023-08-08 NOTE — Progress Notes (Signed)
PT Cancellation Note  Patient Details Name: Tarissa Bucknam MRN: [id number] DOB: Apr 29, 1951   Cancelled Treatment:    Reason Eval/Treat Not Completed: Patient not medically ready. Pt not available as they are going to receive a pace maker implant. Will re-attempt Tx session at a later date.   Mikisha Roseland Sauvignon Howard SPT, LAT, ATC  Matilde Pottenger Sauvignon-Howard 08/08/2023, 10:29 AM

## 2023-08-08 NOTE — Plan of Care (Signed)
  Problem: Education: Goal: Ability to describe self-care measures that may prevent or decrease complications (Diabetes Survival Skills Education) will improve Outcome: Progressing Goal: Individualized Educational Video(s) Outcome: Progressing   Problem: Fluid Volume: Goal: Ability to maintain a balanced intake and output will improve Outcome: Progressing   

## 2023-08-08 NOTE — Plan of Care (Signed)
ECHO  1. TDS. ["technically difficult study"]  2. Left ventricular ejection fraction, by estimation, is 50 to 55%. The  left ventricle has low normal function. The left ventricle has no regional  wall motion abnormalities. Left ventricular diastolic function could not  be evaluated.   3. Right ventricular systolic function is normal. The right ventricular  size is normal.   4. The mitral valve is normal in structure. Trivial mitral valve  regurgitation.   5. The aortic valve is normal in structure. Aortic valve regurgitation is  not visualized.   Conclusion(s)/Recommendation(s): Poor windows for evaluation of left  ventricular function by transthoracic echocardiography. Would recommend an  alternative means of evaluation.   Plan as previously noted.  Planned for loop recorder today. Inpatient neurology will sign off at this time, please do not hesitate to reach out if additional questions or concerns arise.  Discussed with Dr. Myriam Forehand in person and via secure chat  Brooke Dare MD-PhD [facility] [phone] [facility] coverage for [facility] is from 8 AM to 4 AM in-house and 4 PM to 8 PM by telephone/video. 8 PM to 8 AM emergent questions or overnight urgent questions should be addressed to Teleneurology On-call or [facility] neurohospitalist; contact information can be found on AMION

## 2023-08-09 ENCOUNTER — Encounter: Payer: Self-pay | Admitting: Cardiology

## 2023-08-09 DIAGNOSIS — I639 Cerebral infarction, unspecified: Secondary | ICD-10-CM | POA: Diagnosis not present

## 2023-08-09 LAB — RENAL FUNCTION PANEL
Albumin: 3.6 g/dL (ref 3.5–5.0)
Anion gap: 14 (ref 5–15)
BUN: 48 mg/dL — ABNORMAL HIGH (ref 8–23)
CO2: 28 mmol/L (ref 22–32)
Calcium: 9.1 mg/dL (ref 8.9–10.3)
Chloride: 91 mmol/L — ABNORMAL LOW (ref 98–111)
Creatinine, Ser: 6.94 mg/dL — ABNORMAL HIGH (ref 0.44–1.00)
GFR, Estimated: 6 mL/min — ABNORMAL LOW (ref >60)
Glucose, Bld: 87 mg/dL (ref 70–99)
Phosphorus: 6.8 mg/dL — ABNORMAL HIGH (ref 2.5–4.6)
Potassium: 4.4 mmol/L (ref 3.5–5.1)
Sodium: 133 mmol/L — ABNORMAL LOW (ref 135–145)

## 2023-08-09 LAB — CBC
HCT: 31 % — ABNORMAL LOW (ref 36.0–46.0)
Hemoglobin: 9.9 g/dL — ABNORMAL LOW (ref 12.0–15.0)
MCH: 27.6 pg (ref 26.0–34.0)
MCHC: 31.9 g/dL (ref 30.0–36.0)
MCV: 86.4 fL (ref 80.0–100.0)
Platelets: 256 10*3/uL (ref 150–400)
RBC: 3.59 MIL/uL — ABNORMAL LOW (ref 3.87–5.11)
RDW: 14.2 % (ref 11.5–15.5)
WBC: 6.7 10*3/uL (ref 4.0–10.5)
nRBC: 0 % (ref 0.0–0.2)

## 2023-08-09 MED ORDER — SEVELAMER CARBONATE 800 MG PO TABS: 800.0000 mg | ORAL_TABLET | Freq: Three times a day (TID) | ORAL | 0 refills | Status: DC

## 2023-08-09 MED ORDER — OXYCODONE-ACETAMINOPHEN 5-325 MG PO TABS: 1.0000 | ORAL_TABLET | ORAL | 0 refills | Status: DC | PRN

## 2023-08-09 MED ORDER — PENTAFLUOROPROP-TETRAFLUOROETH EX AERO
INHALATION_SPRAY | CUTANEOUS | Status: AC
Start: 2023-08-09 — End: ?
  Filled 2023-08-09: qty 30

## 2023-08-09 MED ORDER — HEPARIN SODIUM (PORCINE) 1000 UNIT/ML DIALYSIS
1000.0000 [IU] | INTRAMUSCULAR | Status: DC | PRN
Start: 2023-08-09 — End: 2023-08-09

## 2023-08-09 MED ORDER — ALTEPLASE 2 MG IJ SOLR: 2.0000 mg | Freq: Once | INTRAMUSCULAR | Status: DC | PRN

## 2023-08-09 NOTE — Progress Notes (Signed)
Pt. 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 Attempted to cannulate the graft but pt insisted we use her cvc d/t not sticking the side she preferred. MD notified.

## 2023-08-09 NOTE — Progress Notes (Signed)
OT Cancellation Note  Patient Details Name: Deanna [name] MRN: [id number] DOB: 12/12/1950   Cancelled Treatment:    Reason Eval/Treat Not Completed: Patient declined, no reason specified. Pt off unit this AM for HD, this PM pt reports fatigue and irritation over d/c plan, defers session. Will re-attempt as able.   Kathie Dike, M.S. OTR/L  08/09/23, 3:29 PM  ascom [phone]

## 2023-08-09 NOTE — TOC Transition Note (Signed)
Transition of Care [facility]) - CM/SW Discharge Note   Patient Details  Name: Deanna [name] MRN: [id number] Date of Birth: April 14, 1951  Transition of Care [facility]) CM/SW Contact:  Darolyn Rua, LCSW Phone Number: 08/09/2023, 1:00 PM   Clinical Narrative:     Patient will DC to: [facility] Anticipated DC date: 08/09/23 Family notified: sister Transport by: [facility]  Per MD patient ready for DC to . RN, patient, patient's family, and facility notified of DC. Discharge Summary sent to facility. RN given number for report [phone]  Room 305. DC packet on chart. Ambulance transport requested for patient.  CSW signing off.     Final next level of care: Skilled Nursing Facility Barriers to Discharge: Continued Medical Work up   Patient Goals and CMS Choice CMS Medicare.gov Compare Post Acute Care list provided to:: Patient Choice offered to / list presented to : Patient  Discharge Placement                Patient [facility] bed at: [facility] Patient to be transferred to facility by: [facility] Name of family member notified: son/sister Patient and family notified of of transfer: 08/09/23  Discharge Plan and Services Additional resources added to the After Visit Summary for       Post Acute Care Choice: Skilled Nursing Facility                               Social Determinants of Health (SDOH) Interventions SDOH Screenings   Food Insecurity: No Food Insecurity (08/04/2023)  Housing: Low Risk  (08/04/2023)  Transportation Needs: No Transportation Needs (08/04/2023)  Utilities: Not At Risk (08/04/2023)  Tobacco Use: Low Risk  (08/07/2023)     Readmission Risk Interventions    01/16/2022    3:45 PM 12/29/2021    2:33 PM  Readmission Risk Prevention Plan  Transportation Screening Complete Complete  PCP or Specialist Appt within 5-7 Days  Complete  Home Care Screening  Complete  Medication Review (RN CM)  Complete  Medication Review (RN Care  Manager) Complete   PCP or Specialist appointment within 3-5 days of discharge Complete   HRI or Home Care Consult Complete   SW Recovery Care/Counseling Consult Complete   Palliative Care Screening Not Applicable   Skilled Nursing Facility Not Applicable

## 2023-08-09 NOTE — TOC Progression Note (Signed)
Transition of Care [facility]) - Progression Note    Patient Details  Name: Oletha Emshoff MRN: [id number] Date of Birth: Jun 22, 1951  Transition of Care [facility]) CM/SW Contact  Darolyn Rua, [place] Phone Number: 08/09/2023, 10:12 AM  Clinical Narrative:     Insurance auth approved for [employer]: Plan Auth ID: [id number] Auth ID: [id number] Good through 11/18   Expected Discharge Plan: Skilled Nursing Facility Barriers to Discharge: Continued Medical Work up  Expected Discharge Plan and Services     Post Acute Care Choice: Skilled Nursing Facility Living arrangements for the past 2 months: Single Family Home                                       Social Determinants of Health (SDOH) Interventions SDOH Screenings   Food Insecurity: No Food Insecurity (08/04/2023)  Housing: Low Risk  (08/04/2023)  Transportation Needs: No Transportation Needs (08/04/2023)  Utilities: Not At Risk (08/04/2023)  Tobacco Use: Low Risk  (08/07/2023)    Readmission Risk Interventions    01/16/2022    3:45 PM 12/29/2021    2:33 PM  Readmission Risk Prevention Plan  Transportation Screening Complete Complete  PCP or Specialist Appt within 5-7 Days  Complete  Home Care Screening  Complete  Medication Review (RN CM)  Complete  Medication Review (RN Care Manager) Complete   PCP or Specialist appointment within 3-5 days of discharge Complete   HRI or Home Care Consult Complete   SW Recovery Care/Counseling Consult Complete   Palliative Care Screening Not Applicable   Skilled Nursing Facility Not Applicable

## 2023-08-09 NOTE — Progress Notes (Signed)
Patient requested not to be disturbed at midnight for NIH assessment or vital signs.

## 2023-08-09 NOTE — Progress Notes (Signed)
Received patient in bed to unit.    Informed consent signed and in chart.    TX duration: 3.5 hrs     Transported back to floor  Hand-off given to patient's nurse.   Access used:  lt avg Access issues: n/a Cvc accessed and fushed hep locked  Total UF removed: 900 mls       Maple Hudson, RN Dialysis Unit

## 2023-08-09 NOTE — Plan of Care (Signed)
  Problem: Education: Goal: Ability to describe self-care measures that may prevent or decrease complications (Diabetes Survival Skills Education) will improve Outcome: Not Progressing   Problem: Coping: Goal: Ability to adjust to condition or change in health will improve Outcome: Not Progressing

## 2023-08-09 NOTE — Discharge Planning (Signed)
Established Hemodialysis: Outpatient Facility [facility]  [address] [place], [place] [address] [phone]  Schedule: TTS 6:15am Arrival: 6:00am Pick up: 10:20am  Dimas Chyle Dialysis Coordinator II  [facility] Cell: [phone] eFax: [phone] [email or web address]

## 2023-08-09 NOTE — Plan of Care (Signed)

## 2023-08-09 NOTE — Care Management Important Message (Signed)
Important Message  Patient Details  Name: Deanna [name] MRN: [id number] Date of Birth: April 25, 1951   Important Message Given:  Yes - Medicare IM  I reviewed the Important Message from Medicare with the patient's HCPOA, Major Kellis, son by phone [phone] and he is in agreement with the discharge plan. I thanked him for his time.    [name] 08/09/2023, 10:26 AM

## 2023-08-09 NOTE — Discharge Summary (Addendum)
Physician Discharge Summary   Patient: Deanna [name] MRN: [id number] DOB: 1951/07/26  Admit date:     08/04/2023  Discharge date: 08/09/23  Discharge Physician: Enedina Finner   PCP: [facility]   Recommendations at discharge:   resume your hemodialysis on your routine schedule follow-up PCP in 1 to 2 weeks  Discharge Diagnoses: Principal Problem:   Acute CVA (cerebrovascular accident) [facility]) Active Problems:   HFrEF (heart failure with reduced ejection fraction) (HCC)   End-stage renal disease on hemodialysis (HCC)   Type 2 diabetes mellitus without complications (HCC)   CVA (cerebral vascular accident) (HCC)   CAD (coronary artery disease)   PAD (peripheral artery disease) (HCC)   Closed nondisplaced fracture of third cervical vertebra (HCC)   Deanna [name] is a 72 y.o. female  with medical history significant for CVA, ESRD on HD, extensive PAD s/p recent angioplasty and stent, HFrEF with last EF of 30-35% (April 2023), hypertension, hyperlipidemia, type 2 diabetes, CAD, DISH arthritis, C3 anterior osteophyte fracture (was recently cleared by neurosurgeon to discontinue cervical collar on 07/25/2023).  She  presented to the hospital with weakness in the left upper and left lower extremities.  She said her symptoms started about a week prior to admission .   Cryptogenic acute stroke (infarcts in the right thalamus and posterior right frontal lobe): --Continue aspirin and Plavix.  -- Continue Lipitor. --No hemodynamically significant stenosis on carotid duplex ultrasound --2D echo showed EF estimated at 50 to 55% indeterminate LV diastolic function.  Echo was also reported as technically difficult study. --S/p placement of loop recorder by EP cardiologist on 08/08/2023. --PT and OT recommended discharge to SNF. --Hemoglobin A1c is 5.2.  -- Seen by neurology   Chronic systolic and diastolic CHF: Compensated. --Echo on 08/06/2023 shows recovered EF. 2D  echo in May 2023 showed EF estimated at 30 to 35%, grade 1 diastolic dysfunction, mild to moderate MR    ESRD on hemodialysis:  -- resume dialysis schedule as before    CAD,/PAD: Recent percutaneous transluminal angioplasty of right peroneal artery and tibioperoneal trunk and right distal SFA and proximal popliteal artery on 07/23/2023. --Continue aspirin and Plavix -- continue statins   Closed C3 vertebral fracture: She was evaluated by Dr. Katrinka Blazing, neurosurgeon, on 07/25/2023, who said it was okay to discontinue cervical collar on 07/25/2023   anemia of chronic disease -- hemoglobin stable  Patient has multiple other comorbidities. She is at present best at baseline. Patient will discharge to rehab today. Care management to update rehab plans to family.     Pain control - [employer] Controlled Substance Reporting System database was reviewed. and patient was instructed, not to drive, operate heavy machinery, perform activities at heights, swimming or participation in water activities or provide baby-sitting services while on Pain, Sleep and Anxiety Medications; until their outpatient Physician has advised to do so again. Also recommended to not to take more than prescribed Pain, Sleep and Anxiety Medications.  Consultants: neurology  Disposition: Rehabilitation facility Diet recommendation:  Discharge Diet Orders (From admission, onward)     Start     Ordered   08/09/23 0000  Diet - low sodium heart healthy        08/09/23 1210           Cardiac diet DISCHARGE MEDICATION: Allergies as of 08/09/2023       Reactions   Ace Inhibitors Swelling   Entresto [sacubitril-valsartan] Swelling   Patient said she took both lisinopril and Entresto so  it is not clear which one she actually reacted to.  For safety reasons, she has been advised to avoid both ACE inhibitors and Entresto.   Lisinopril Swelling   Severe Angioedema (requiring Nasotracheal intubation)   Penicillins Rash    Tomato Rash        Medication List     STOP taking these medications    furosemide 20 MG tablet Commonly known as: LASIX   gabapentin 100 MG capsule Commonly known as: NEURONTIN   methylPREDNISolone 4 MG Tbpk tablet Commonly known as: MEDROL DOSEPAK   potassium chloride SA 20 MEQ tablet Commonly known as: KLOR-CON M   sevelamer 800 MG tablet Commonly known as: RENAGEL Replaced by: sevelamer carbonate 800 MG tablet       TAKE these medications    aspirin EC 81 MG tablet Take 81 mg by mouth daily.   atorvastatin 40 MG tablet Commonly known as: LIPITOR Take 40 mg by mouth daily.   clopidogrel 75 MG tablet Commonly known as: Plavix Take 1 tablet (75 mg total) by mouth daily.   ferrous sulfate 325 (65 FE) MG EC tablet Take 325 mg by mouth in the morning.   metoprolol tartrate 25 MG tablet Commonly known as: LOPRESSOR Take 1 tablet (25 mg total) by mouth 2 (two) times daily.   oxyCODONE-acetaminophen 5-325 MG tablet Commonly known as: Percocet Take 1 tablet by mouth every 4 (four) hours as needed for severe pain (pain score 7-10).   sevelamer carbonate 800 MG tablet Commonly known as: RENVELA Take 1 tablet (800 mg total) by mouth 3 (three) times daily with meals. Replaces: sevelamer 800 MG tablet   Vitamin D (Ergocalciferol) 1.25 MG (50000 UNIT) Caps capsule Commonly known as: DRISDOL Take 50,000 Units by mouth once a week. Wednesday        Follow-up Information     [facility] Schedule an appointment as soon as possible for a visit in 1 week(s).   Specialty: General Practice Why: Hospital follow-up Contact information: [address] [place] [address] [phone]                 Filed Weights   08/07/23 1206 08/09/23 0400 08/09/23 0820  Weight: 71 kg 71.2 kg 71.9 kg     Condition at discharge: fair  The results of significant diagnostics from this hospitalization (including imaging,  microbiology, ancillary and laboratory) are listed below for reference.   Imaging Studies: EP PPM/ICD IMPLANT  Result Date: 08/08/2023 CONCLUSIONS:  1. Successful implantation of a implantable loop recorder for Cryptogenic stroke  2. No early apparent complications.   ECHOCARDIOGRAM LIMITED BUBBLE STUDY  Result Date: 08/08/2023    ECHOCARDIOGRAM LIMITED REPORT   Patient Name:   KYNLEIGH BONN Date of Exam: 08/06/2023 Medical Rec #:  [id number]        Height:       73.0 in Accession #:    [id number]       Weight:       160.9 lb Date of Birth:  Aug 03, 1951        BSA:          1.962 m Patient Age:    72 years         BP:           139/71 mmHg Patient Gender: F                HR:           82  bpm. Exam Location:  [facility] Procedure: 2D Echo, Cardiac Doppler and Color Doppler Indications:     Stroke 434.91 / I63.9  History:         Patient has prior history of Echocardiogram examinations, most                  recent 12/28/2021. CHF; Risk Factors:Hypertension and Diabetes.  Sonographer:     Cristela Blue Referring Phys:  [id number] Verdene Lennert Diagnosing Phys: Alwyn Pea MD  Sonographer Comments: Suboptimal apical window. IMPRESSIONS  1. TDS.  2. Left ventricular ejection fraction, by estimation, is 50 to 55%. The left ventricle has low normal function. The left ventricle has no regional wall motion abnormalities. Left ventricular diastolic function could not be evaluated.  3. Right ventricular systolic function is normal. The right ventricular size is normal.  4. The mitral valve is normal in structure. Trivial mitral valve regurgitation.  5. The aortic valve is normal in structure. Aortic valve regurgitation is not visualized. Conclusion(s)/Recommendation(s): Poor windows for evaluation of left ventricular function by transthoracic echocardiography. Would recommend an alternative means of evaluation. FINDINGS  Left Ventricle: Left ventricular ejection fraction, by estimation, is 50 to 55%. The left ventricle  has low normal function. The left ventricle has no regional wall motion abnormalities. There is no left ventricular hypertrophy. Left ventricular diastolic function could not be evaluated. Right Ventricle: The right ventricular size is normal. No increase in right ventricular wall thickness. Right ventricular systolic function is normal. Left Atrium: Left atrial size was normal in size. Right Atrium: Right atrial size was normal in size. Pericardium: There is no evidence of pericardial effusion. Mitral Valve: The mitral valve is normal in structure. Trivial mitral valve regurgitation. Tricuspid Valve: The tricuspid valve is normal in structure. Tricuspid valve regurgitation is trivial. Aortic Valve: The aortic valve is normal in structure. Aortic valve regurgitation is not visualized. Pulmonic Valve: The pulmonic valve was normal in structure. Pulmonic valve regurgitation is not visualized. Aorta: Aortic root could not be assessed and the ascending aorta was not well visualized. IAS/Shunts: The interatrial septum was not well visualized. Additional Comments: TDS. Spectral Doppler performed. Color Doppler performed.  LEFT VENTRICLE PLAX 2D LVIDd:         4.50 cm LVIDs:         3.30 cm LV PW:         1.00 cm LV IVS:        1.10 cm LVOT diam:     2.00 cm LVOT Area:     3.14 cm  LEFT ATRIUM         Index LA diam:    3.20 cm 1.63 cm/m   AORTA Ao Root diam: 2.60 cm TRICUSPID VALVE TR Peak grad:   12.4 mmHg TR Vmax:        176.00 cm/s  SHUNTS Systemic Diam: 2.00 cm Alwyn Pea MD Electronically signed by Alwyn Pea MD Signature Date/Time: 08/08/2023/8:14:36 AM    Final    DG Chest 1 View  Result Date: 08/07/2023 CLINICAL DATA:  [id number].  End-stage chronic kidney disease. EXAM: CHEST  1 VIEW COMPARISON:  PA chest and rib series 05/17/2023. FINDINGS: The heart size and mediastinal contours are within normal limits. There are calcific plaques in the transverse aorta. Both lungs are clear. The visualized  skeletal structures are intact with multilevel spinal bridging enthesopathy. Right IJ dialysis catheter again terminates in the mid to distal SVC. IMPRESSION: No evidence of acute chest disease. Aortic  atherosclerosis. Stable chest. Electronically Signed   By: Almira Bar M.D.   On: 08/07/2023 20:20   CT ANGIO HEAD NECK W WO CM  Result Date: 08/07/2023 CLINICAL DATA:  Initial evaluation for neuro deficit, stroke suspected. EXAM: CT ANGIOGRAPHY HEAD AND NECK WITH AND WITHOUT CONTRAST TECHNIQUE: Multidetector CT imaging of the head and neck was performed using the standard protocol during bolus administration of intravenous contrast. Multiplanar CT image reconstructions and MIPs were obtained to evaluate the vascular anatomy. Carotid stenosis measurements (when applicable) are obtained utilizing NASCET criteria, using the distal internal carotid diameter as the denominator. RADIATION DOSE REDUCTION: This exam was performed according to the departmental dose-optimization program which includes automated exposure control, adjustment of the mA and/or kV according to patient size and/or use of iterative reconstruction technique. CONTRAST:  75mL OMNIPAQUE IOHEXOL 350 MG/ML SOLN COMPARISON:  Comparison made with CTA and ultrasound from 08/05/2023. FINDINGS: CT HEAD FINDINGS Brain: Cerebral volume within normal limits. Moderate chronic microvascular ischemic disease. Previously identified small volume right cerebral infarcts not visible by CT. No other acute large vessel territory infarct. No acute intracranial hemorrhage. No mass lesion or midline shift. No hydrocephalus or extra-axial fluid collection. Vascular: No abnormal hyperdense vessel. Skull: Scalp soft tissues and calvarium demonstrate no new finding. Sinuses/Orbits: Globes and orbital soft tissues within normal limits. Paranasal sinuses are largely clear. Trace right mastoid effusion. Other: None. Review of the MIP images confirms the above findings CTA  NECK FINDINGS Aortic arch: Aortic arch within normal limits for caliber with standard branch pattern. Mild aortic atherosclerosis. No stenosis about the origin the great vessels. Right carotid system: Right common and internal carotid arteries are patent without dissection. Moderate calcified plaque about the right carotid bulb with associated stenosis of up to 50% by NASCET criteria. Left carotid system: Left common and internal carotid arteries are patent without dissection. Moderate calcified plaque about the left carotid bulb without hemodynamically significant greater than 50% stenosis. Vertebral arteries: Both vertebral arteries arise from subclavian arteries. No proximal subclavian artery stenosis. Vertebral arteries are patent without stenosis or dissection. 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 No worrisome osseous lesions. Patient is edentulous. Other neck: No other acute finding. Upper chest: No other acute finding. Review of the MIP images confirms the above findings CTA HEAD FINDINGS Anterior circulation: Atheromatous change about the carotid siphons bilaterally, right worse than left. Resultant moderate to severe stenosis at the supraclinoid right ICA, with up to moderate narrowing at the contralateral left paraclinoid ICA. A1 segments patent bilaterally. Normal anterior communicating artery complex. Anterior cerebral arteries patent without stenosis. No M1 stenosis or occlusion. Distal MCA branches perfused and symmetric. Posterior circulation: Both V4 segments widely patent. Right PICA patent. Left PICA origin not well seen. Basilar widely patent without stenosis. Superior cerebral arteries patent bilaterally. Both PCAs primarily supplied via the basilar. Short-segment mild proximal left P2 stenosis (series 12, image 128). PCAs otherwise patent to their distal aspects without significant stenosis. Venous sinuses: Grossly patent  allowing for timing the contrast bolus. Anatomic variants: None significant.  No aneurysm. Review of the MIP images confirms the above findings IMPRESSION: CT HEAD: 1. No acute intracranial abnormality. Previously identified small volume right cerebral infarcts not visible by CT. 2. Moderate chronic microvascular ischemic disease. CTA HEAD AND NECK: 1. Negative CTA for large vessel occlusion or other emergent finding. 2. Atheromatous change about the carotid siphons bilaterally, right worse than left, with resultant moderate to severe stenosis at  the supraclinoid right ICA, with up to moderate narrowing at the contralateral left paraclinoid ICA. 3. Moderate calcified plaque about the right carotid bulb with associated stenosis of up to 50% by NASCET criteria. 4.  Aortic Atherosclerosis (ICD10-I70.0). Electronically Signed   By: Rise Mu M.D.   On: 08/07/2023 03:32   US Carotid Bilateral  Result Date: 08/06/2023 CLINICAL DATA:  72 year old female with a history of stroke EXAM: BILATERAL CAROTID DUPLEX ULTRASOUND TECHNIQUE: Wallace Cullens scale imaging, color Doppler and duplex ultrasound were performed of bilateral carotid and vertebral arteries in the neck. COMPARISON:  None Available. FINDINGS: Criteria: Quantification of carotid stenosis is based on velocity parameters that correlate the residual internal carotid diameter with NASCET-based stenosis levels, using the diameter of the distal internal carotid lumen as the denominator for stenosis measurement. The following velocity measurements were obtained: RIGHT ICA:  Systolic 97 cm/sec, Diastolic 28 cm/sec CCA:  93 cm/sec SYSTOLIC ICA/CCA RATIO:  1.0 ECA:  159 cm/sec LEFT ICA:  Systolic 130 cm/sec, Diastolic 19 cm/sec CCA:  100 cm/sec SYSTOLIC ICA/CCA RATIO:  1.3 ECA:  129 cm/sec Right Brachial SBP: Not acquired Left Brachial SBP: Not acquired RIGHT CAROTID ARTERY: No significant calcifications of the right common carotid artery. Intermediate waveform  maintained. Moderate heterogeneous and partially calcified plaque at the right carotid bifurcation. Lumen shadowing at the bifurcation, with the maximal velocity obtained distal to the bifurcation. Low resistance waveform of the right ICA. No significant tortuosity. RIGHT VERTEBRAL ARTERY: Antegrade flow with low resistance waveform. LEFT CAROTID ARTERY: No significant calcifications of the left common carotid artery. Intermediate waveform maintained. Moderate heterogeneous and partially calcified plaque at the left carotid bifurcation. Shadowing present at the bifurcation. Low resistance waveform of the left ICA. No significant tortuosity. LEFT VERTEBRAL ARTERY:  Antegrade flow with low resistance waveform. IMPRESSION: Right: Color duplex indicates moderate heterogeneous and calcified plaque, with no hemodynamically significant stenosis by duplex criteria in the extracranial cerebrovascular circulation. Note that flow velocities of the right ICA were obtained from an area distal to the maximum narrowing due to the presence of anterior wall plaque with shadowing and may be underestimating the percentage of ICA stenosis. If establishing a more accurate degree of stenosis is required, cerebral angiogram should be considered, or as a second best test, CTA. Left: Color duplex indicates moderate heterogeneous and calcified plaque, with no hemodynamically significant stenosis by duplex criteria in the extracranial cerebrovascular circulation. Signed, Yvone Neu. Miachel Roux, RPVI [facility] Electronically Signed   By: Gilmer Mor D.O.   On: 08/06/2023 12:33   CT ANGIO HEAD NECK W WO CM  Result Date: 08/05/2023 CLINICAL DATA:  Neuro deficit, acute, stroke suspected EXAM: CT ANGIOGRAPHY HEAD AND NECK WITH AND WITHOUT CONTRAST TECHNIQUE: Multidetector CT imaging of the head and neck was performed using the standard protocol during bolus administration of  intravenous contrast. Multiplanar CT image reconstructions and MIPs were obtained to evaluate the vascular anatomy. Carotid stenosis measurements (when applicable) are obtained utilizing NASCET criteria, using the distal internal carotid diameter as the denominator. RADIATION DOSE REDUCTION: This exam was performed according to the departmental dose-optimization program which includes automated exposure control, adjustment of the mA and/or kV according to patient size and/or use of iterative reconstruction technique. CONTRAST:  75mL OMNIPAQUE IOHEXOL 350 MG/ML SOLN COMPARISON:  Brain MR 08/04/23 FINDINGS: CT HEAD FINDINGS Brain: No hemorrhage. No hydrocephalus. No extra-axial fluid collection. No mass effect. No mass lesion. Chronic infarcts in the left caudate head. There  is background of mild chronic microvascular ischemic change. Vascular: No hyperdense vessel or unexpected calcification. Skull: Normal. Negative for fracture or focal lesion. Sinuses/Orbits: No middle ear or mastoid effusion. Paranasal sinuses are clear. Bilateral lens replacement. Orbits are otherwise unremarkable. Other: There is anterior subluxation of the mandibular heads bilaterally without dislocation Review of the MIP images confirms the above findings CTA NECK FINDINGS Limitations: Motion degraded exam particularly at the level of the carotid bifurcation. Detailed assessment for the degree of vessel stenosis is limited at this level. Aortic arch: Standard branching. Imaged portion shows no evidence of aneurysm or dissection. No significant stenosis of the major arch vessel origins. Right carotid system: No evidence of dissection, stenosis (50% or greater), or occlusion. Left carotid system: No evidence of dissection, stenosis (50% or greater), or occlusion. Vertebral arteries: Codominant. No evidence of dissection, stenosis (50% or greater), or occlusion. Skeleton: There are osseous findings of DISH multilevel anterior bridging osteophytes.  There is likely at least moderate spinal canal narrowing C5-C6 and C6-C7 Other neck: Right-sided central venous catheter in place. Upper chest: Patulous and fluid-filled esophagus. Review of the MIP images confirms the above findings CTA HEAD FINDINGS Anterior circulation: Mild narrowing of the supraclinoid ICA on the right (series 12, image 123) no proximal occlusion, aneurysm, or vascular malformation. Posterior circulation: No significant stenosis, proximal occlusion, aneurysm, or vascular malformation. Venous sinuses: As permitted by contrast timing, patent. Anatomic variants: None Review of the MIP images confirms the above findings IMPRESSION: 1. No acute intracranial abnormality. Chronic infarcts in the left caudate head. Infarcts seen on recent brain MRI are not visualized due to CT technique. 2. No intracranial large vessel occlusion. Mild narrowing of the supraclinoid ICA on the right. 3. Note that assessment of the neck vasculature is markedly limited due to the degree of motion artifact. Within this limitation, no definite evidence of an alveolar or hemodynamically significant stenosis. 4. Patulous and fluid-filled esophagus, which could predispose to aspiration. 5. Osseous findings of DISH with likely at least moderate spinal canal narrowing at C5-C6 and C6-C7. Electronically Signed   By: Lorenza Cambridge M.D.   On: 08/05/2023 16:24   MR BRAIN WO CONTRAST  Result Date: 08/04/2023 CLINICAL DATA:  Neuro deficit, acute, stroke suspected L sided hemibody numbness. Left upper extremity and lower extremity weakness. Recent cervical fracture; Neck pain, acute, prior cervical surgery L sided weakness EXAM: MRI HEAD WITHOUT CONTRAST MRI CERVICAL SPINE WITHOUT CONTRAST TECHNIQUE: Multiplanar, multiecho pulse sequences of the brain and surrounding structures, and cervical spine, to include the craniocervical junction and cervicothoracic junction, were obtained without intravenous contrast. COMPARISON:  MR C Spine  05/17/23 FINDINGS: MRI HEAD FINDINGS Brain: There is an acute linear infarct in the right thalamus (series 5, image 20) in the posterior right frontal lobe. No acute hemorrhage. No hydrocephalus. No extra-axial fluid collection. There is a background of moderate chronic microvascular ischemic change. 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 Skull and upper cervical spine: Normal marrow signal. Sinuses/Orbits: Small right mastoid effusion. Paranasal sinuses are clear. Bilateral lens replacement. Orbits are otherwise unremarkable. Other: None. MRI CERVICAL SPINE FINDINGS Alignment: Physiologic. Vertebrae: Redemonstrated osseous findings of DISH. There is T2/STIR hyperintense signal abnormality with the anterior osteophyte at C3-C4 and T1-T2. This signal abnormality at the C3-C4 level likely represents healing posttraumatic changes. Cord: There may be very mild T2 hyperintense signal abnormality cervical spinal cord at the C3-C4 level (series 12, image 10). Posterior Fossa, vertebral arteries, paraspinal tissues: Negative. Disc  levels: Limitations: Note that assessment of the degree of spinal canal or neural foraminal narrowing is limited due to the degree of motion artifact. C1-C2: Mild degenerative change. C2-C3: Minimal disc bulge. Mild spinal canal narrowing. No neural foraminal narrowing. Neural foraminal narrowing. C3-C4: Mild bilateral facet degenerative change. Circumferential disc bulge. Moderate spinal canal narrowing. Moderate left and mild right neural foraminal narrowing. Uncovertebral hypertrophy. C4-C5: Mild bilateral facet degenerative change. No significant disc bulge. Mild spinal canal narrowing. Mild right neural foraminal narrowing. C5-C6: Severe bilateral facet degenerative change. Uncovertebral hypertrophy. Circumferential disc bulge. Moderate spinal canal narrowing. Moderate bilateral neural foraminal narrowing. C6-C7: Moderate bilateral facet degenerative change.  Circumferential disc bulge. Moderate spinal canal narrowing. Moderate left and mild right neural foraminal narrowing. C7-T1: Unremarkable IMPRESSION: 1. 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 No hemorrhage. 2. Redemonstrated osseous findings of DISH. T2/STIR hyperintense signal abnormality with the anterior osteophyte at C3-C4 and T1-T2, likely representing healing posttraumatic changes. 3. There may be very mild T2 hyperintense signal abnormality in the cervical spinal cord at the C3-C4 level, which may be artifactual or represent myelomalacia. 4. Additional degenerative changes as above. Electronically Signed   By: Lorenza Cambridge M.D.   On: 08/04/2023 14:37   MR Cervical Spine Wo Contrast  Result Date: 08/04/2023 CLINICAL DATA:  Neuro deficit, acute, stroke suspected L sided hemibody numbness. Left upper extremity and lower extremity weakness. Recent cervical fracture; Neck pain, acute, prior cervical surgery L sided weakness EXAM: MRI HEAD WITHOUT CONTRAST MRI CERVICAL SPINE WITHOUT CONTRAST TECHNIQUE: Multiplanar, multiecho pulse sequences of the brain and surrounding structures, and cervical spine, to include the craniocervical junction and cervicothoracic junction, were obtained without intravenous contrast. COMPARISON:  MR C Spine 05/17/23 FINDINGS: MRI HEAD FINDINGS Brain: There is an acute linear infarct in the right thalamus (series 5, image 20) in the posterior right frontal lobe. No acute hemorrhage. No hydrocephalus. No extra-axial fluid collection. There is a background of moderate chronic microvascular ischemic change. 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 Skull and upper cervical spine: Normal marrow signal. Sinuses/Orbits: Small right mastoid effusion. Paranasal sinuses are clear. Bilateral lens replacement. Orbits are otherwise unremarkable. Other: None. MRI CERVICAL SPINE FINDINGS Alignment: Physiologic. Vertebrae:  Redemonstrated osseous findings of DISH. There is T2/STIR hyperintense signal abnormality with the anterior osteophyte at C3-C4 and T1-T2. This signal abnormality at the C3-C4 level likely represents healing posttraumatic changes. Cord: There may be very mild T2 hyperintense signal abnormality cervical spinal cord at the C3-C4 level (series 12, image 10). Posterior Fossa, vertebral arteries, paraspinal tissues: Negative. Disc levels: Limitations: Note that assessment of the degree of spinal canal or neural foraminal narrowing is limited due to the degree of motion artifact. C1-C2: Mild degenerative change. C2-C3: Minimal disc bulge. Mild spinal canal narrowing. No neural foraminal narrowing. Neural foraminal narrowing. C3-C4: Mild bilateral facet degenerative change. Circumferential disc bulge. Moderate spinal canal narrowing. Moderate left and mild right neural foraminal narrowing. Uncovertebral hypertrophy. C4-C5: Mild bilateral facet degenerative change. No significant disc bulge. Mild spinal canal narrowing. Mild right neural foraminal narrowing. C5-C6: Severe bilateral facet degenerative change. Uncovertebral hypertrophy. Circumferential disc bulge. Moderate spinal canal narrowing. Moderate bilateral neural foraminal narrowing. C6-C7: Moderate bilateral facet degenerative change. Circumferential disc bulge. Moderate spinal canal narrowing. Moderate left and mild right neural foraminal narrowing. C7-T1: Unremarkable IMPRESSION: 1. 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 No hemorrhage. 2. Redemonstrated osseous findings of DISH. T2/STIR hyperintense  signal abnormality with the anterior osteophyte at C3-C4 and T1-T2, likely representing healing posttraumatic changes. 3. There may be very mild T2 hyperintense signal abnormality in the cervical spinal cord at the C3-C4 level, which may be artifactual or represent myelomalacia. 4. Additional degenerative changes as above.  Electronically Signed   By: Lorenza Cambridge M.D.   On: 08/04/2023 14:37   DG Cervical Spine Complete  Result Date: 08/02/2023 CLINICAL DATA:  Fusion. EXAM: CERVICAL SPINE - COMPLETE 4+ VIEW COMPARISON:  Cervical spine radiographs 05/09/2023. CT cervical spine 05/23/2023. FINDINGS: Four weight-bearing views of the cervical spine with flexion and extension views. Unchanged diffuse idiopathic skeletal hyperostosis with bulky anterior osteophytes from at least C2 through the upper thoracic spine. [place] through the anterior osteophytes at C3-4, unchanged. Normal alignment. No evidence of dynamic listhesis on flexion or extension views. IMPRESSION: 1. Unchanged diffuse idiopathic skeletal hyperostosis with bulky anterior osteophytes from at least C2 through the upper thoracic spine. Lucency through the anterior osteophytes at C3-4, unchanged. 2. Normal alignment. No evidence of dynamic listhesis on flexion or extension views. Electronically Signed   By: Orvan Falconer M.D.   On: 08/02/2023 15:11   CT Cervical Spine Wo Contrast  Result Date: 08/02/2023 CLINICAL DATA:  Cervical radiculopathy. EXAM: CT CERVICAL SPINE WITHOUT CONTRAST TECHNIQUE: Multidetector CT imaging of the cervical spine was performed without intravenous contrast. Multiplanar CT image reconstructions were also generated. RADIATION DOSE REDUCTION: This exam was performed according to the departmental dose-optimization program which includes automated exposure control, adjustment of the mA and/or kV according to patient size and/or use of iterative reconstruction technique. COMPARISON:  CT cervical spine 05/23/2023. FINDINGS: Alignment: Normal. Skull base and vertebrae: Diffuse idiopathic skeletal hyperostosis with bulky anterior osteophytes from C2 to T3, unchanged from prior. No acute fracture. Craniocervical junction is intact. Soft tissues and spinal canal: No prevertebral fluid or swelling. No visible canal hematoma. Disc levels: C2-C3: Small  central disc osteophyte complex. No spinal canal stenosis or neural foraminal narrowing. C3-C4: Central disc osteophyte complex results in no more than mild spinal canal stenosis. C4-C5: Right-greater-than-left facet arthropathy and uncovertebral joint spurring results in moderate right neural foraminal narrowing. C5-C6: Central disc-osteophyte complex results in at least mild spinal canal stenosis. Facet arthropathy and uncovertebral joint spurring results in moderate bilateral neural foraminal narrowing. C6-C7: Central disc osteophyte complex results in no more than mild spinal canal stenosis. Right-greater-than-left facet arthropathy and uncovertebral joint spurring results in severe right neural foraminal narrowing. C7-T1:  Unremarkable. Upper chest: No acute findings. Other: Atherosclerotic calcifications of the carotid bulbs. IMPRESSION: 1. No acute fracture or traumatic listhesis of the cervical spine. 2. Multilevel cervical spondylosis, worst at C5-C6 where there is at least mild spinal canal stenosis and moderate bilateral neural foraminal narrowing. 3. Severe right neural foraminal narrowing at C6-C7. 4. Diffuse idiopathic skeletal hyperostosis with bulky anterior osteophytes from C2 to T3, unchanged from prior. Electronically Signed   By: Orvan Falconer M.D.   On: 08/02/2023 15:07   PERIPHERAL VASCULAR CATHETERIZATION  Result Date: 07/23/2023 See surgical note for result.  VAS US DUPLEX DIALYSIS ACCESS (AVF, AVG)  Result Date: 07/19/2023 DIALYSIS ACCESS Patient Name:  CHEROKEE BRUNICK  Date of Exam:   07/18/2023 Medical Rec #: [id number]         Accession #:    [id number] Date of Birth: 06-18-1951         Patient Gender: F Patient Age:   86 years Exam Location:  [facility] Procedure:  VAS US DUPLEX DIALYSIS ACCESS (AVF, AVG) Referring Phys: Festus Barren --------------------------------------------------------------------------------  Access Site: Right Upper Extremity. Access Type:  Forearm loop AVG. History: 05/17/2023: Right Forearm AVG Loop Graft inserted. Patient reports          periodic, spontaneous numbness and pain in her right thumb.           06/28/2023: Right Brachial Artery to Brachial Vein Forearm AVG          cannulation under US guidance. Rt Arm Shuntogram. PTA of the Arterial          Anastomosis with 5 mm diameter Lutonix drug coated angioplasty balloon.          PTA of the Venous Anastomosis with 5 mm diameter Lutonix drug coated          angioplasty balloon. Stent placement to the Venous Anastomosis with 6          mm diameter by 5 cm Viabahn stent. Comparison Study: 06/20/2023 Performing Technologist: Debbe Bales RVS  Examination Guidelines: A complete evaluation includes B-mode imaging, spectral Doppler, color Doppler, and power Doppler as needed of all accessible portions of each vessel. Unilateral testing is considered an integral part of a complete examination. Limited examinations for reoccurring indications may be performed as noted.  Findings:   +--------------------+----------+-----------------+--------+ AVG                 PSV (cm/s)Flow Vol (mL/min)Describe +--------------------+----------+-----------------+--------+ Native artery inflow   163          1125                +--------------------+----------+-----------------+--------+ Arterial anastomosis   389                              +--------------------+----------+-----------------+--------+ Prox graft             227                              +--------------------+----------+-----------------+--------+ Mid graft              171                              +--------------------+----------+-----------------+--------+ Distal graft           217                              +--------------------+----------+-----------------+--------+ Venous anastomosis      94                              +--------------------+----------+-----------------+--------+ Venous outflow          187                              +--------------------+----------+-----------------+--------+ +--------------+-------------+---------+---------+---------+-------------------+               Diameter (cm)  Depth  Branching   PSV       Flow Volume                                  (cm)             (  cm/s)       (ml/min)       +--------------+-------------+---------+---------+---------+-------------------+ Rt Rad Art                                      66                        Dist                                                                      +--------------+-------------+---------+---------+---------+-------------------+  Summary: The Right ForeArm Loop AVG appears to be patent throughout; Flow Volume appears to be Normal.  *See table(s) above for measurements and observations.  Diagnosing physician: Festus Barren MD Electronically signed by Festus Barren MD on 07/19/2023 at 9:31:30 AM.   --------------------------------------------------------------------------------   Final     Microbiology: Results for orders placed or performed during the hospital encounter of 01/20/22  Blood culture (routine x 2)     Status: None   Collection Time: 01/20/22  7:22 PM   Specimen: BLOOD  Result Value Ref Range Status   Specimen Description BLOOD BLOOD RIGHT HAND  Final   Special Requests   Final    BOTTLES DRAWN AEROBIC AND ANAEROBIC Blood Culture results may not be optimal due to an inadequate volume of blood received in culture bottles   Culture   Final    NO GROWTH 5 DAYS Performed at [facility], [address] [place], [place] [address]    Report Status 01/25/2022 FINAL  Final  Blood culture (routine x 2)     Status: None   Collection Time: 01/20/22  7:22 PM   Specimen: BLOOD  Result Value Ref Range Status   Specimen Description BLOOD BLOOD LEFT HAND  Final   Special Requests   Final    BOTTLES DRAWN AEROBIC AND ANAEROBIC Blood Culture results may not be optimal  due to an inadequate volume of blood received in culture bottles   Culture   Final    NO GROWTH 5 DAYS Performed at [facility], [address] [place], [place] [address]    Report Status 01/25/2022 FINAL  Final  Resp Panel by RT-PCR (Flu A&B, Covid) Nasopharyngeal Swab     Status: None   Collection Time: 01/20/22  7:28 PM   Specimen: Nasopharyngeal Swab; Nasopharyngeal(NP) swabs in vial transport medium  Result Value Ref Range Status   SARS Coronavirus 2 by RT PCR NEGATIVE NEGATIVE Final    Comment: (NOTE) SARS-CoV-2 target nucleic acids are NOT DETECTED.  The SARS-CoV-2 RNA is generally detectable in upper respiratory specimens during the acute phase of infection. The lowest concentration of SARS-CoV-2 viral copies this assay can detect is 138 copies/mL. A negative result does not preclude SARS-Cov-2 infection and should not be used as the sole basis for treatment or other patient management decisions. A negative result may occur with  improper specimen collection/handling, submission of specimen other than nasopharyngeal swab, presence of viral mutation(s) within the areas targeted by this assay, and inadequate number of viral copies(<138 copies/mL). A negative result must be combined with clinical observations, patient history, and epidemiological information. The  expected result is Negative.  Fact Sheet for Patients:  BloggerCourse.com  Fact Sheet for Healthcare Providers:  SeriousBroker.it  This test is no t yet approved or cleared by the [place] FDA and  has been authorized for detection and/or diagnosis of SARS-CoV-2 by FDA under an Emergency Use Authorization (EUA). This EUA will remain  in effect (meaning this test can be used) for the duration of the COVID-19 declaration under Section 564(b)(1) of the Act, 21 U.S.C.section 360bbb-3(b)(1), unless the authorization is terminated  or revoked sooner.        Influenza A by PCR NEGATIVE NEGATIVE Final   Influenza B by PCR NEGATIVE NEGATIVE Final    Comment: (NOTE) The Xpert Xpress SARS-CoV-2/FLU/RSV plus assay is intended as an aid in the diagnosis of influenza from Nasopharyngeal swab specimens and should not be used as a sole basis for treatment. Nasal washings and aspirates are unacceptable for Xpert Xpress SARS-CoV-2/FLU/RSV testing.  Fact Sheet for Patients: BloggerCourse.com  Fact Sheet for Healthcare Providers: SeriousBroker.it  This test is not yet approved or cleared by the [place] FDA and has been authorized for detection and/or diagnosis of SARS-CoV-2 by FDA under an Emergency Use Authorization (EUA). This EUA will remain in effect (meaning this test can be used) for the duration of the COVID-19 declaration under Section 564(b)(1) of the Act, 21 U.S.C. section 360bbb-3(b)(1), unless the authorization is terminated or revoked.  Performed at [facility], [address] [place], [place] [address]     Labs: CBC: Recent Labs  Lab 08/04/23 1020 08/05/23 0907 08/06/23 1801 08/07/23 0836 08/09/23 0833  WBC 10.1 7.1 7.1 7.1 6.7  NEUTROABS  --  4.2  --   --   --   HGB 11.3* 10.3* 10.1* 10.0* 9.9*  HCT 35.3* 31.7* 30.8* 31.3* 31.0*  MCV 85.9 84.1 85.3 85.8 86.4  PLT 283 258 262 273 256   Basic Metabolic Panel: Recent Labs  Lab 08/04/23 1020 08/05/23 0907 08/06/23 1801 08/07/23 0836 08/09/23 0833  NA 136 133* 134* 130* 133*  K 3.7 4.1 5.2* 5.1 4.4  CL 95* 93* 95* 92* 91*  CO2 29 27 27 26 28   GLUCOSE 145* 99 99 84 87  BUN 17 36* 59* 66* 48*  CREATININE 3.19* 4.88* 6.75* 7.46* 6.94*  CALCIUM 9.0 9.0 9.1 8.9 9.1  PHOS  --   --   --  6.6* 6.8*   Liver Function Tests: Recent Labs  Lab 08/07/23 0836 08/09/23 0833  ALBUMIN 3.5 3.6   CBG: Recent Labs  Lab 08/05/23 1106 08/05/23 1628 08/05/23 2034 08/06/23 0806 08/06/23 1729   GLUCAP 94 100* 115* 85 101*    Discharge time spent: greater than 30 minutes.  Signed: Enedina Finner, MD [facility] 08/09/2023
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
## 2023-08-09 NOTE — Progress Notes (Signed)
Report given to [facility] at [facility]; all questions answered.

## 2023-08-09 NOTE — Progress Notes (Signed)
[facility]  ROUNDING NOTE   Subjective:   Deanna [name]  is a 72 year old African-American female with a past medical history of coronary artery disease, status post PCI, CHF with reduced ejection fraction, CKD, diabetes mellitus, hypertension, and end stage renal disease on hemodialysis. Patient presents to the ED from her dialysis center with left arm pain and weakness.  Patient has been admitted for Acute CVA (cerebrovascular accident) [facility]) [I63.9] Cerebrovascular accident (CVA), unspecified mechanism (HCC) [I63.9] CVA (cerebral vascular accident) [facility]) [I63.9]  Patient is known to our practice and receives outpatient dialysis treatments at [facility] on a TTS schedule, supervised by Dr. Cherylann Ratel.   Update Patient seen and evaluated during dialysis   HEMODIALYSIS FLOWSHEET:  Blood Flow Rate (mL/min): 399 mL/min Arterial Pressure (mmHg): -160.19 mmHg Venous Pressure (mmHg): 209.48 mmHg TMP (mmHg): 11.31 mmHg Ultrafiltration Rate (mL/min): 543 mL/min Dialysate Flow Rate (mL/min): 300 ml/min  Seems patient is frustrated with HD staff trying to initiate treatment and access graft.  Also states her loop recorder bleed all night and her chest is painful.   Objective:  Vital signs in last 24 hours:  Temp:  [97.6 F (36.4 C)-98.2 F (36.8 C)] 97.9 F (36.6 C) (11/14 0820) Pulse Rate:  [74-92] 87 (11/14 0930) Resp:  [9-21] 21 (11/14 0930) BP: (119-150)/(62-73) 119/62 (11/14 0930) SpO2:  [98 %-100 %] 100 % (11/14 0900) Weight:  [71.2 kg-71.9 kg] 71.9 kg (11/14 0820)  Weight change: -0.685 kg Filed Weights   08/07/23 1206 08/09/23 0400 08/09/23 0820  Weight: 71 kg 71.2 kg 71.9 kg    Intake/Output: No intake/output data recorded.   Intake/Output this shift:  No intake/output data recorded.  Physical Exam: General: NAD  Head: Normocephalic, atraumatic. Moist oral mucosal membranes  Eyes: Anicteric  Lungs:  Clear to auscultation, room air  Heart:  Regular rate and rhythm  Abdomen:  Soft, nontender  Extremities: Trace peripheral edema.  Neurologic: Alert, moving all four extremities  Skin: No lesions  Access: Rt permcath, AVG placed on 05/17/23     Basic Metabolic Panel: Recent Labs  Lab 08/04/23 1020 08/05/23 0907 08/06/23 1801 08/07/23 0836 08/09/23 0833  NA 136 133* 134* 130* 133*  K 3.7 4.1 5.2* 5.1 4.4  CL 95* 93* 95* 92* 91*  CO2 29 27 27 26 28   GLUCOSE 145* 99 99 84 87  BUN 17 36* 59* 66* 48*  CREATININE 3.19* 4.88* 6.75* 7.46* 6.94*  CALCIUM 9.0 9.0 9.1 8.9 9.1  PHOS  --   --   --  6.6* 6.8*    Liver Function Tests: Recent Labs  Lab 08/07/23 0836 08/09/23 0833  ALBUMIN 3.5 3.6   No results for input(s): "LIPASE", "AMYLASE" in the last 168 hours. No results for input(s): "AMMONIA" in the last 168 hours.  CBC: Recent Labs  Lab 08/04/23 1020 08/05/23 0907 08/06/23 1801 08/07/23 0836 08/09/23 0833  WBC 10.1 7.1 7.1 7.1 6.7  NEUTROABS  --  4.2  --   --   --   HGB 11.3* 10.3* 10.1* 10.0* 9.9*  HCT 35.3* 31.7* 30.8* 31.3* 31.0*  MCV 85.9 84.1 85.3 85.8 86.4  PLT 283 258 262 273 256    Cardiac Enzymes: No results for input(s): "CKTOTAL", "CKMB", "CKMBINDEX", "TROPONINI" in the last 168 hours.  BNP: Invalid input(s): "POCBNP"  CBG: Recent Labs  Lab 08/05/23 1106 08/05/23 1628 08/05/23 2034 08/06/23 0806 08/06/23 1729  GLUCAP 94 100* 115* 85 101*    Microbiology: Results for orders placed or performed  during the hospital encounter of 01/20/22  Blood culture (routine x 2)     Status: None   Collection Time: 01/20/22  7:22 PM   Specimen: BLOOD  Result Value Ref Range Status   Specimen Description BLOOD BLOOD RIGHT HAND  Final   Special Requests   Final    BOTTLES DRAWN AEROBIC AND ANAEROBIC Blood Culture results may not be optimal due to an inadequate volume of blood received in culture bottles   Culture   Final    NO GROWTH 5 DAYS Performed at [facility], [address] [place], [place] [address]    Report Status 01/25/2022 FINAL  Final  Blood culture (routine x 2)     Status: None   Collection Time: 01/20/22  7:22 PM   Specimen: BLOOD  Result Value Ref Range Status   Specimen Description BLOOD BLOOD LEFT HAND  Final   Special Requests   Final    BOTTLES DRAWN AEROBIC AND ANAEROBIC Blood Culture results may not be optimal due to an inadequate volume of blood received in culture bottles   Culture   Final    NO GROWTH 5 DAYS Performed at [facility], [address] [place], [place] [address]    Report Status 01/25/2022 FINAL  Final  Resp Panel by RT-PCR (Flu A&B, Covid) Nasopharyngeal Swab     Status: None   Collection Time: 01/20/22  7:28 PM   Specimen: Nasopharyngeal Swab; Nasopharyngeal(NP) swabs in vial transport medium  Result Value Ref Range Status   SARS Coronavirus 2 by RT PCR NEGATIVE NEGATIVE Final    Comment: (NOTE) SARS-CoV-2 target nucleic acids are NOT DETECTED.  The SARS-CoV-2 RNA is generally detectable in upper respiratory specimens during the acute phase of infection. The lowest concentration of SARS-CoV-2 viral copies this assay can detect is 138 copies/mL. A negative result does not preclude SARS-Cov-2 infection and should not be used as the sole basis for treatment or other patient management decisions. A negative result may occur with  improper specimen collection/handling, submission of specimen other than nasopharyngeal swab, presence of viral mutation(s) within the areas targeted by this assay, and inadequate number of viral copies(<138 copies/mL). A negative result must be combined with clinical observations, patient history, and epidemiological information. The expected result is Negative.  Fact Sheet for Patients:  BloggerCourse.com  Fact Sheet for Healthcare Providers:  SeriousBroker.it  This test is no t yet approved or cleared by the [place] FDA  and  has been authorized for detection and/or diagnosis of SARS-CoV-2 by FDA under an Emergency Use Authorization (EUA). This EUA will remain  in effect (meaning this test can be used) for the duration of the COVID-19 declaration under Section 564(b)(1) of the Act, 21 U.S.C.section 360bbb-3(b)(1), unless the authorization is terminated  or revoked sooner.       Influenza A by PCR NEGATIVE NEGATIVE Final   Influenza B by PCR NEGATIVE NEGATIVE Final    Comment: (NOTE) The Xpert Xpress SARS-CoV-2/FLU/RSV plus assay is intended as an aid in the diagnosis of influenza from Nasopharyngeal swab specimens and should not be used as a sole basis for treatment. Nasal washings and aspirates are unacceptable for Xpert Xpress SARS-CoV-2/FLU/RSV testing.  Fact Sheet for Patients: BloggerCourse.com  Fact Sheet for Healthcare Providers: SeriousBroker.it  This test is not yet approved or cleared by the [place] FDA and has been authorized for detection and/or diagnosis of SARS-CoV-2 by FDA under an Emergency Use Authorization (EUA). This EUA will remain  in effect (meaning this test can be used) for the duration of the COVID-19 declaration under Section 564(b)(1) of the Act, 21 U.S.C. section 360bbb-3(b)(1), unless the authorization is terminated or revoked.  Performed at [facility], [address] [place], [place] [address]     Coagulation Studies: No results for input(s): "LABPROT", "INR" in the last 72 hours.  Urinalysis: No results for input(s): "COLORURINE", "LABSPEC", "PHURINE", "GLUCOSEU", "HGBUR", "BILIRUBINUR", "KETONESUR", "PROTEINUR", "UROBILINOGEN", "NITRITE", "LEUKOCYTESUR" in the last 72 hours.  Invalid input(s): "APPERANCEUR"    Imaging: EP PPM/ICD IMPLANT  Result Date: 08/08/2023 CONCLUSIONS:  1. Successful implantation of a implantable loop recorder for Cryptogenic stroke  2. No early apparent  complications.   DG Chest 1 View  Result Date: 08/07/2023 CLINICAL DATA:  [id number].  End-stage chronic kidney disease. EXAM: CHEST  1 VIEW COMPARISON:  PA chest and rib series 05/17/2023. FINDINGS: The heart size and mediastinal contours are within normal limits. There are calcific plaques in the transverse aorta. Both lungs are clear. The visualized skeletal structures are intact with multilevel spinal bridging enthesopathy. Right IJ dialysis catheter again terminates in the mid to distal SVC. IMPRESSION: No evidence of acute chest disease. Aortic atherosclerosis. Stable chest. Electronically Signed   By: Almira Bar M.D.   On: 08/07/2023 20:20     Medications:     aspirin EC  81 mg Oral Daily   atorvastatin  40 mg Oral Daily   Chlorhexidine Gluconate Cloth  6 each Topical Daily   clopidogrel  75 mg Oral Daily   ferrous sulfate  325 mg Oral Q breakfast   heparin injection (subcutaneous)  5,000 Units Subcutaneous Q8H   midodrine  2.5 mg Oral TID WC   sevelamer carbonate  800 mg Oral TID WC   acetaminophen **OR** acetaminophen (TYLENOL) oral liquid 160 mg/5 mL **OR** acetaminophen, alteplase, fentaNYL (SUBLIMAZE) injection, heparin, ondansetron (ZOFRAN) IV, oxyCODONE-acetaminophen, senna-docusate  Assessment/ Plan:  [name] is a 72 y.o.  female with a past medical history of coronary artery disease, status post PCI, CHF with reduced ejection fraction, CKD, diabetes mellitus, hypertension, and end stage renal disease on hemodialysis. Patient presents to the ED from her dialysis center with left arm pain and weakness.  Patient has been admitted for Acute CVA (cerebrovascular accident) [facility]) [I63.9] Cerebrovascular accident (CVA), unspecified mechanism (HCC) [I63.9] CVA (cerebral vascular accident) (HCC) [I63.9]  [facility]/TTS/Rt AVG/71.0 kg  1.  End-stage renal disease on hemodialysis.  Receiving dialysis today, UF goal 1L as tolerated. Next treatment scheduled  for Saturday.  Discussed with patient the desire to use graft, as they do in clinic. Answered questions and explained different techniques are used by each staff member. Patient agreeable to continue treatment with graft. Will consult Vascular surgery to remove Permcath if treatment well today.    2.  Acute stroke.  Brain MRI shows acute lacunar infarct and right numbness and posterior frontal lobe.Currently prescribed aspirin and Plavix.  Experiencing left-sided weakness.  Neurology consulted.   Loop recorder placed on 08/08/23. Discussed with patient that some light bleeding is expected and soreness with improve.   3. Anemia of chronic kidney disease Lab Results  Component Value Date   HGB 9.9 (L) 08/09/2023    Patient receives Mircera at outpatient clinic.  Hgb at goal. Will continue to monitor and assess need for ESA.   4. Secondary Hyperparathyroidism: with outpatient labs: PTH 776, phosphorus 7.4, calcium 8.1 on 07/10/23.    Lab Results  Component Value Date  PTH 396 (H) 01/08/2022   CALCIUM 9.1 08/09/2023   CAION 1.15 05/17/2023   PHOS 6.8 (H) 08/09/2023    Patient prescribed calcitriol and Cinacalcet outpatient. Continue sevelamer with meals.    LOS: 4 Blake Goya 11/14/202410:13 AM

## 2023-08-09 NOTE — Progress Notes (Signed)
PT Cancellation Note  Patient Details Name: Deanna [name] MRN: [id number] DOB: 06-25-1951   Cancelled Treatment:    Reason Eval/Treat Not Completed: Other (comment). Pt currently out of room for HD. Will re-attempt next available date.   Berry Gallacher 08/09/2023, 8:37 AM Elizabeth Palau, PT, DPT, GCS [phone]

## 2023-08-10 ENCOUNTER — Other Ambulatory Visit (INDEPENDENT_AMBULATORY_CARE_PROVIDER_SITE_OTHER): Payer: Self-pay | Admitting: Vascular Surgery

## 2023-08-10 DIAGNOSIS — Z9889 Other specified postprocedural states: Secondary | ICD-10-CM

## 2023-08-14 ENCOUNTER — Other Ambulatory Visit: Payer: Self-pay

## 2023-08-14 ENCOUNTER — Emergency Department: Payer: Medicare HMO

## 2023-08-14 ENCOUNTER — Encounter: Payer: Self-pay | Admitting: Emergency Medicine

## 2023-08-14 ENCOUNTER — Emergency Department
Admission: EM | Admit: 2023-08-14 | Discharge: 2023-08-15 | Disposition: A | Discharge status: Home / Self Care | Payer: Medicare HMO | Attending: Emergency Medicine | Admitting: Emergency Medicine

## 2023-08-14 DIAGNOSIS — N186 End stage renal disease: Secondary | ICD-10-CM | POA: Diagnosis not present

## 2023-08-14 DIAGNOSIS — Z992 Dependence on renal dialysis: Secondary | ICD-10-CM | POA: Diagnosis not present

## 2023-08-14 DIAGNOSIS — R112 Nausea with vomiting, unspecified: Secondary | ICD-10-CM

## 2023-08-14 DIAGNOSIS — E1122 Type 2 diabetes mellitus with diabetic chronic kidney disease: Secondary | ICD-10-CM | POA: Insufficient documentation

## 2023-08-14 DIAGNOSIS — I120 Hypertensive chronic kidney disease with stage 5 chronic kidney disease or end stage renal disease: Secondary | ICD-10-CM | POA: Insufficient documentation

## 2023-08-14 DIAGNOSIS — R079 Chest pain, unspecified: Secondary | ICD-10-CM

## 2023-08-14 LAB — TROPONIN I (HIGH SENSITIVITY)
Troponin I (High Sensitivity): 17 ng/L (ref <18)
Troponin I (High Sensitivity): 19 ng/L — ABNORMAL HIGH (ref <18)

## 2023-08-14 LAB — BASIC METABOLIC PANEL
Anion gap: 10 (ref 5–15)
BUN: 24 mg/dL — ABNORMAL HIGH (ref 8–23)
CO2: 31 mmol/L (ref 22–32)
Calcium: 8.6 mg/dL — ABNORMAL LOW (ref 8.9–10.3)
Chloride: 94 mmol/L — ABNORMAL LOW (ref 98–111)
Creatinine, Ser: 4.12 mg/dL — ABNORMAL HIGH (ref 0.44–1.00)
GFR, Estimated: 11 mL/min — ABNORMAL LOW (ref >60)
Glucose, Bld: 112 mg/dL — ABNORMAL HIGH (ref 70–99)
Potassium: 3.8 mmol/L (ref 3.5–5.1)
Sodium: 135 mmol/L (ref 135–145)

## 2023-08-14 LAB — CBC
HCT: 31.6 % — ABNORMAL LOW (ref 36.0–46.0)
Hemoglobin: 10.3 g/dL — ABNORMAL LOW (ref 12.0–15.0)
MCH: 27.8 pg (ref 26.0–34.0)
MCHC: 32.6 g/dL (ref 30.0–36.0)
MCV: 85.2 fL (ref 80.0–100.0)
Platelets: 267 10*3/uL (ref 150–400)
RBC: 3.71 MIL/uL — ABNORMAL LOW (ref 3.87–5.11)
RDW: 14.7 % (ref 11.5–15.5)
WBC: 7.3 10*3/uL (ref 4.0–10.5)
nRBC: 0.3 % — ABNORMAL HIGH (ref 0.0–0.2)

## 2023-08-14 MED ORDER — HYDROCODONE-ACETAMINOPHEN 5-325 MG PO TABS
1.0000 | ORAL_TABLET | Freq: Once | ORAL | Status: AC
  Administered 2023-08-14: 1 via ORAL
  Filled 2023-08-14: qty 1

## 2023-08-14 MED ORDER — ONDANSETRON 4 MG PO TBDP
4.0000 mg | ORAL_TABLET | Freq: Once | ORAL | Status: AC
  Administered 2023-08-14: 4 mg via ORAL
  Filled 2023-08-14: qty 1

## 2023-08-14 NOTE — ED Notes (Signed)
Pt refusing to sit up for this RN to do a full neuro assessment due to back pain

## 2023-08-14 NOTE — ED Notes (Signed)
Attempted to call [facility]

## 2023-08-14 NOTE — ED Triage Notes (Signed)
Patient to ED via [facility] from [facility]. EMS initially called out for stroke like symptoms- no new numbness or weakness. PT did have episode of vomiting while at facility. Hx of stroke with left sided deficits. EMS states patient started c/o chest pain while en route. Denies cardiac hx. Dialysis pt- received full treatment today.

## 2023-08-14 NOTE — ED Provider Notes (Signed)
[facility] Provider Note    Event Date/Time   First MD Initiated Contact with Patient 08/14/23 2216     (approximate)  History   Chief Complaint: Chest Pain  HPI  Laniece Tinklenberg is a 72 y.o. female with a past medical history of ESRD on HD Tuesday/Thursday/Saturday, gastric reflux, hypertension, hyperlipidemia, prior CVA with left deficits, diabetes, presents to the emergency department for nausea and vomiting.  According to the patient she had received a full session of dialysis this morning.  States she was trying to eat a sandwich when she took several bites she began vomiting.  States EMS was called.  And route to the hospital she states she was having some chest pain which she related to the recent vomiting.  Patient has now been in the emergency department approximately 4 hours.  During my evaluation patient has no complaints no longer is experiencing any chest pain and is requesting something to eat.  Denies any abdominal pain at any point.  Physical Exam   Triage Vital Signs: ED Triage Vitals  Encounter Vitals Group     BP 08/14/23 1827 (!) 127/46     Systolic BP Percentile --      Diastolic BP Percentile --      Pulse Rate 08/14/23 1827 85     Resp 08/14/23 1827 18     Temp 08/14/23 1827 97.8 F (36.6 C)     Temp Source 08/14/23 1827 Oral     SpO2 08/14/23 1827 96 %     Weight 08/14/23 1828 159 lb (72.1 kg)     Height 08/14/23 1828 6\' 1"  (1.854 m)     Head Circumference --      Peak Flow --      Pain Score 08/14/23 1827 10     Pain Loc --      Pain Education --      Exclude from Growth Chart --     Most recent vital signs: Vitals:   08/14/23 1827  BP: (!) 127/46  Pulse: 85  Resp: 18  Temp: 97.8 F (36.6 C)  SpO2: 96%    General: Awake, no distress.  CV:  Good peripheral perfusion.  Regular rate and rhythm  Resp:  Normal effort.  Equal breath sounds bilaterally.  Abd:  No distention.  Soft, nontender.  No rebound or guarding.   Benign abdomen. Other:  Right upper extremity fistula   ED Results / Procedures / Treatments   EKG  EKG viewed and interpreted by myself shows a sinus rhythm at 89 bpm with a narrow QRS, normal axis, slight PR prolongation otherwise normal intervals, nonspecific ST changes.  RADIOLOGY  I have reviewed and interpreted the chest x-ray images.  No consolidation seen on my evaluation.   MEDICATIONS ORDERED IN ED: Medications  ondansetron (ZOFRAN-ODT) disintegrating tablet 4 mg (has no administration in time range)     IMPRESSION / MDM / ASSESSMENT AND PLAN / ED COURSE  I reviewed the triage vital signs and the nursing notes.  Patient's presentation is most consistent with acute presentation with potential threat to life or bodily function.  Patient presents to the emergency department for nausea vomiting and chest pain.  Overall the patient appears well.  No distress.  Patient's workup so far is reassuring with a normal CBC, reassuring chemistry including a normal potassium level.  Patient's chest x-ray is clear and EKG does not appear to show any significant finding.  Patient is hungry she is asking  for something to eat.  We will feed the patient we will repeat a troponin.  As long as the patient's repeat troponin remains negative and she remains asymptomatic I believe the patient will be safe for discharge home with PCP follow-up.  FINAL CLINICAL IMPRESSION(S) / ED DIAGNOSES   Chest pain   Note:  This document was prepared using Dragon voice recognition software and may include unintentional dictation errors.   Minna Antis, MD 08/18/23 1415

## 2023-08-14 NOTE — Discharge Instructions (Signed)
Please follow-up with your doctor in the next 1 to 2 days for recheck/reevaluation.  Return to the emergency department for any return of chest pain, any trouble breathing, or any other symptom concerning to yourself.

## 2023-08-14 NOTE — ED Notes (Signed)
Pt given sprite and [place] sandwich tray at this time

## 2023-08-14 NOTE — ED Provider Notes (Signed)
Patient received in signout from Dr. Lenard Lance pending second troponin and reassessment.  Second opponent is flat and patient reports continuing to feel well on my reassessment.  She is asking when she can go back to her facility.  I think this is reasonable considering her benign workup and original plan of care.  She is tolerating p.o. intake.  We discussed close return precautions and dialysis compliance   Delton Prairie, MD 08/14/23 2352

## 2023-08-14 NOTE — ED Notes (Signed)
Pt is requesting something for pain. MD notified.

## 2023-08-20 ENCOUNTER — Ambulatory Visit (INDEPENDENT_AMBULATORY_CARE_PROVIDER_SITE_OTHER): Payer: Medicare HMO | Admitting: Nurse Practitioner

## 2023-08-20 ENCOUNTER — Ambulatory Visit (INDEPENDENT_AMBULATORY_CARE_PROVIDER_SITE_OTHER): Payer: Medicare HMO

## 2023-08-20 ENCOUNTER — Encounter (INDEPENDENT_AMBULATORY_CARE_PROVIDER_SITE_OTHER): Payer: Self-pay | Admitting: Nurse Practitioner

## 2023-08-20 VITALS — BP 108/51 | HR 81 | Resp 16 | Wt 159.0 lb

## 2023-08-20 DIAGNOSIS — I10 Essential (primary) hypertension: Secondary | ICD-10-CM

## 2023-08-20 DIAGNOSIS — I739 Peripheral vascular disease, unspecified: Secondary | ICD-10-CM | POA: Diagnosis not present

## 2023-08-20 DIAGNOSIS — Z9889 Other specified postprocedural states: Secondary | ICD-10-CM | POA: Diagnosis not present

## 2023-08-20 DIAGNOSIS — N186 End stage renal disease: Secondary | ICD-10-CM | POA: Diagnosis not present

## 2023-08-20 DIAGNOSIS — Z992 Dependence on renal dialysis: Secondary | ICD-10-CM

## 2023-08-20 NOTE — H&P (View-Only) (Signed)
 Subjective:    Patient ID: Deanna [name], female    DOB: [birth year], 72 y.o.   MRN: [id number] Chief Complaint  Patient presents with   Follow-up    [facility] 4 week with abi    The patient returns to the office for followup and review status post angiogram with intervention on 07/23/2023.   Procedure: Procedure(s) Performed:             1.  Ultrasound guidance for vascular access left femoral artery             2.  Catheter placement into right common femoral artery from left femoral approach             3.  Aortogram and selective right lower extremity angiogram             4.  Percutaneous transluminal angioplasty of right peroneal artery and tibioperoneal trunk with 3 mm diameter by 10 cm length angioplasty balloon             5.  Percutaneous transluminal angioplasty of the right distal SFA and proximal popliteal artery with 6 mm diameter by 10 cm length Lutonix drug-coated angioplasty balloon             6.  StarClose closure device left femoral artery     The patient notes improvement in the lower extremity symptoms. No interval shortening of the patient's claudication distance or rest pain symptoms. No new ulcers or wounds have occurred since the last visit.  There have been no significant changes to the patient's overall health care.  No documented history of amaurosis fugax or recent TIA symptoms. There are no recent neurological changes noted. No documented history of DVT, PE or superficial thrombophlebitis. The patient denies recent episodes of angina or shortness of breath.   ABI's Rt=1.11 and Lt=1.18  (previous ABI's Rt=1.01 and Lt=1.31) Duplex US of the the patient has monophasic/triphasic waveforms on the right with biphasic on the left    Review of Systems  Neurological:  Positive for weakness.  All other systems reviewed and are negative.      Objective:   Physical Exam Vitals reviewed.  HENT:     Head: Normocephalic.  Cardiovascular:     Rate and  Rhythm: Normal rate.  Pulmonary:     Effort: Pulmonary effort is normal.  Skin:    General: Skin is warm and dry.  Neurological:     Mental Status: She is alert and oriented to person, place, and time.  Psychiatric:        Mood and Affect: Mood normal.        Behavior: Behavior normal.        Thought Content: Thought content normal.        Judgment: Judgment normal.     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   required intubation   Anemia    C3 cervical fracture (HCC) 04/2023   CHF (congestive heart failure) (HCC)    combined systolic and diastolic   Coronary artery disease    ESRD on dialysis (HCC)    T-Th-Sat   GERD (gastroesophageal reflux disease)    Hyperlipidemia    Hypertension    Pneumonia    Secondary hyperparathyroidism of renal origin (HCC)    Stroke (HCC)  with right sided weakness   Type 2 diabetes mellitus with diabetic neuropathy (HCC)     Social History   Socioeconomic History   Marital status: Single    Spouse name: Not on file   Number of children: 3   Years of education: Not on file   Highest education level: Not on file  Occupational History   Not on file  Tobacco Use   Smoking status: Never   Smokeless tobacco: Never  Vaping Use   Vaping status: Never Used  Substance and Sexual Activity   Alcohol use: No   Drug use: No   Sexual activity: Not Currently  Other Topics Concern   Not on file  Social History Narrative   Not on file   Social Determinants of Health   Financial Resource Strain: Not on file  Food Insecurity: No Food Insecurity (08/04/2023)   Hunger Vital Sign    Worried About Running Out of Food in the Last Year: Never true    Ran Out of Food in the Last Year: Never true  Transportation Needs: No Transportation Needs (08/04/2023)   PRAPARE - Scientist, research (physical sciences) (Medical): No    Lack of Transportation (Non-Medical): No  Physical Activity: Not on file  Stress: Not on file  Social Connections: Not on file  Intimate Partner Violence: Not At Risk (08/04/2023)   Humiliation, Afraid, Rape, and Kick questionnaire    Fear of Current or Ex-Partner: No    Emotionally Abused: No    Physically Abused: No    Sexually Abused: No    Past Surgical History:  Procedure Laterality Date   A/V FISTULAGRAM Right 06/28/2023   Procedure: A/V Fistulagram;  Surgeon: Annice Needy, MD;  Location: [facility];  Service: Cardiovascular;  Laterality: Right;   AV FISTULA PLACEMENT Right 05/17/2023   Procedure: INSERTION OF ARTERIOVENOUS (AV) GORE-TEX GRAFT ARM;  Surgeon: Annice Needy, MD;  Location: [facility];  Service: Vascular;  Laterality: Right;   CARDIAC CATHETERIZATION Left 03/01/2016   Procedure: Left Heart Cath and Coronary Angiography;  Surgeon: Dalia Heading, MD;  Location: [facility];  Service: Cardiovascular;  Laterality: Left;   CESAREAN SECTION     DIALYSIS/PERMA CATHETER INSERTION Right 01/10/2022   Procedure: DIALYSIS/PERMA CATHETER INSERTION;  Surgeon: Bertram Denver, MD;  Location: [facility];  Service: Cardiovascular;  Laterality: Right;   DIALYSIS/PERMA CATHETER INSERTION N/A 04/12/2022   Procedure: DIALYSIS/PERMA CATHETER INSERTION;  Surgeon: Annice Needy, MD;  Location: [facility];  Service: Cardiovascular;  Laterality: N/A;   INTUBATION-ENDOTRACHEAL WITH TRACHEOSTOMY STANDBY Left 01/14/2022   Procedure: INTUBATION-NASAL WITH TRACHEOSTOMY STANDBY;  Surgeon: Linus Salmons, MD;  Location: [facility];  Service: ENT;  Laterality: Left;   LOOP RECORDER INSERTION Left 08/08/2023   Procedure: LOOP RECORDER INSERTION;  Surgeon: Lanier Prude, MD;  Location: [facility];  Service: Cardiovascular;  Laterality: Left;   LOWER EXTREMITY ANGIOGRAPHY Right 07/23/2023   Procedure: Lower Extremity  Angiography;  Surgeon: Annice Needy, MD;  Location: [facility];  Service: Cardiovascular;  Laterality: Right;   TEMPORARY DIALYSIS CATHETER N/A 01/04/2022   Procedure: TEMPORARY DIALYSIS CATHETER;  Surgeon: Annice Needy, MD;  Location: [facility];  Service: Cardiovascular;  Laterality: N/A;    Family History  Problem Relation Age of Onset   Osteoarthritis Brother     Allergies  Allergen Reactions   Ace Inhibitors Swelling   Entresto [Sacubitril-Valsartan]  Swelling    Patient said she took both lisinopril and Entresto so it is not clear which one she actually reacted to.  For safety reasons, she has been advised to avoid both ACE inhibitors and Entresto.   Lisinopril Swelling    Severe Angioedema (requiring Nasotracheal intubation)   Penicillins Rash   Tomato Rash       Latest Ref Rng & Units 08/14/2023    8:00 PM 08/09/2023    8:33 AM 08/07/2023    8:36 AM  CBC  WBC 4.0 - 10.5 K/uL 7.3  6.7  7.1   Hemoglobin 12.0 - 15.0 g/dL 96.2  9.9  95.2   Hematocrit 36.0 - 46.0 % 31.6  31.0  31.3   Platelets 150 - 400 K/uL 267  256  273       CMP     Component Value Date/Time   NA 135 08/14/2023 2000   K 3.8 08/14/2023 2000   CL 94 (L) 08/14/2023 2000   CO2 31 08/14/2023 2000   GLUCOSE 112 (H) 08/14/2023 2000   BUN 24 (H) 08/14/2023 2000   CREATININE 4.12 (H) 08/14/2023 2000   CALCIUM 8.6 (L) 08/14/2023 2000   PROT 8.5 (H) 04/28/2023 1136   ALBUMIN 3.6 08/09/2023 0833   AST 16 04/28/2023 1136   ALT 15 04/28/2023 1136   ALKPHOS 113 04/28/2023 1136   BILITOT 0.5 04/28/2023 1136   GFRNONAA 11 (L) 08/14/2023 2000     No results found.     Assessment & Plan:   1. PAD (peripheral artery disease) (HCC) Today noninvasive studies show improvement from prior studies.  The patient's posterior tibial artery is occluded per angiogram which was consistent with studies today that she has from home monophasic anterior tibial artery waveforms with triphasic  peroneal waveforms and good toe waveforms to the right foot.  At this time we feel that she is medically optimized from vascular standpoint for any upcoming surgery.  2. Essential (primary) hypertension Continue antihypertensive medications as already ordered, these medications have been reviewed and there are no changes at this time.  3. End-stage renal disease on hemodialysis [facility]) Today the patient notes that her dialysis access is working well.  She has issues with 1 technician but otherwise she does not have any issues.  Will plan on following this up in 3 months   Current Outpatient Medications on File Prior to Visit  Medication Sig Dispense Refill   aspirin EC 81 MG tablet Take 81 mg by mouth daily.     clopidogrel (PLAVIX) 75 MG tablet Take 1 tablet (75 mg total) by mouth daily. 30 tablet 11   ferrous sulfate 325 (65 FE) MG EC tablet Take 325 mg by mouth in the morning.     metoprolol tartrate (LOPRESSOR) 25 MG tablet Take 1 tablet (25 mg total) by mouth 2 (two) times daily. 60 tablet 0   oxyCODONE-acetaminophen (PERCOCET) 5-325 MG tablet Take 1 tablet by mouth every 4 (four) hours as needed for severe pain (pain score 7-10). 5 tablet 0   sevelamer carbonate (RENVELA) 800 MG tablet Take 1 tablet (800 mg total) by mouth 3 (three) times daily with meals. 60 tablet 0   Vitamin D, Ergocalciferol, (DRISDOL) 1.25 MG (50000 UNIT) CAPS capsule Take 50,000 Units by mouth once a week. Wednesday     atorvastatin (LIPITOR) 40 MG tablet Take 40 mg by mouth daily. (Patient not taking: Reported on 08/08/2023)     No current facility-administered medications on file prior to  visit.    There are no Patient Instructions on file for this visit. No follow-ups on file.   Georgiana Spinner, NP

## 2023-08-20 NOTE — Progress Notes (Signed)
Subjective:    Patient ID: Deanna [name], female    DOB: [birth year], 72 y.o.   MRN: [id number] Chief Complaint  Patient presents with   Follow-up    [facility] 4 week with abi    The patient returns to the office for followup and review status post angiogram with intervention on 07/23/2023.   Procedure: Procedure(s) Performed:             1.  Ultrasound guidance for vascular access left femoral artery             2.  Catheter placement into right common femoral artery from left femoral approach             3.  Aortogram and selective right lower extremity angiogram             4.  Percutaneous transluminal angioplasty of right peroneal artery and tibioperoneal trunk with 3 mm diameter by 10 cm length angioplasty balloon             5.  Percutaneous transluminal angioplasty of the right distal SFA and proximal popliteal artery with 6 mm diameter by 10 cm length Lutonix drug-coated angioplasty balloon             6.  StarClose closure device left femoral artery     The patient notes improvement in the lower extremity symptoms. No interval shortening of the patient's claudication distance or rest pain symptoms. No new ulcers or wounds have occurred since the last visit.  There have been no significant changes to the patient's overall health care.  No documented history of amaurosis fugax or recent TIA symptoms. There are no recent neurological changes noted. No documented history of DVT, PE or superficial thrombophlebitis. The patient denies recent episodes of angina or shortness of breath.   ABI's Rt=1.11 and Lt=1.18  (previous ABI's Rt=1.01 and Lt=1.31) Duplex US of the the patient has monophasic/triphasic waveforms on the right with biphasic on the left    Review of Systems  Neurological:  Positive for weakness.  All other systems reviewed and are negative.      Objective:   Physical Exam Vitals reviewed.  HENT:     Head: Normocephalic.  Cardiovascular:     Rate and  Rhythm: Normal rate.  Pulmonary:     Effort: Pulmonary effort is normal.  Skin:    General: Skin is warm and dry.  Neurological:     Mental Status: She is alert and oriented to person, place, and time.  Psychiatric:        Mood and Affect: Mood normal.        Behavior: Behavior normal.        Thought Content: Thought content normal.        Judgment: Judgment normal.     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   required intubation   Anemia    C3 cervical fracture (HCC) 04/2023   CHF (congestive heart failure) (HCC)    combined systolic and diastolic   Coronary artery disease    ESRD on dialysis (HCC)    T-Th-Sat   GERD (gastroesophageal reflux disease)    Hyperlipidemia    Hypertension    Pneumonia    Secondary hyperparathyroidism of renal origin (HCC)    Stroke (HCC)  with right sided weakness   Type 2 diabetes mellitus with diabetic neuropathy (HCC)     Social History   Socioeconomic History   Marital status: Single    Spouse name: Not on file   Number of children: 3   Years of education: Not on file   Highest education level: Not on file  Occupational History   Not on file  Tobacco Use   Smoking status: Never   Smokeless tobacco: Never  Vaping Use   Vaping status: Never Used  Substance and Sexual Activity   Alcohol use: No   Drug use: No   Sexual activity: Not Currently  Other Topics Concern   Not on file  Social History Narrative   Not on file   Social Determinants of Health   Financial Resource Strain: Not on file  Food Insecurity: No Food Insecurity (08/04/2023)   Hunger Vital Sign    Worried About Running Out of Food in the Last Year: Never true    Ran Out of Food in the Last Year: Never true  Transportation Needs: No Transportation Needs (08/04/2023)   PRAPARE - Scientist, research (physical sciences) (Medical): No    Lack of Transportation (Non-Medical): No  Physical Activity: Not on file  Stress: Not on file  Social Connections: Not on file  Intimate Partner Violence: Not At Risk (08/04/2023)   Humiliation, Afraid, Rape, and Kick questionnaire    Fear of Current or Ex-Partner: No    Emotionally Abused: No    Physically Abused: No    Sexually Abused: No    Past Surgical History:  Procedure Laterality Date   A/V FISTULAGRAM Right 06/28/2023   Procedure: A/V Fistulagram;  Surgeon: Annice Needy, MD;  Location: [facility];  Service: Cardiovascular;  Laterality: Right;   AV FISTULA PLACEMENT Right 05/17/2023   Procedure: INSERTION OF ARTERIOVENOUS (AV) GORE-TEX GRAFT ARM;  Surgeon: Annice Needy, MD;  Location: [facility];  Service: Vascular;  Laterality: Right;   CARDIAC CATHETERIZATION Left 03/01/2016   Procedure: Left Heart Cath and Coronary Angiography;  Surgeon: Dalia Heading, MD;  Location: [facility];  Service: Cardiovascular;  Laterality: Left;   CESAREAN SECTION     DIALYSIS/PERMA CATHETER INSERTION Right 01/10/2022   Procedure: DIALYSIS/PERMA CATHETER INSERTION;  Surgeon: Bertram Denver, MD;  Location: [facility];  Service: Cardiovascular;  Laterality: Right;   DIALYSIS/PERMA CATHETER INSERTION N/A 04/12/2022   Procedure: DIALYSIS/PERMA CATHETER INSERTION;  Surgeon: Annice Needy, MD;  Location: [facility];  Service: Cardiovascular;  Laterality: N/A;   INTUBATION-ENDOTRACHEAL WITH TRACHEOSTOMY STANDBY Left 01/14/2022   Procedure: INTUBATION-NASAL WITH TRACHEOSTOMY STANDBY;  Surgeon: Linus Salmons, MD;  Location: [facility];  Service: ENT;  Laterality: Left;   LOOP RECORDER INSERTION Left 08/08/2023   Procedure: LOOP RECORDER INSERTION;  Surgeon: Lanier Prude, MD;  Location: [facility];  Service: Cardiovascular;  Laterality: Left;   LOWER EXTREMITY ANGIOGRAPHY Right 07/23/2023   Procedure: Lower Extremity  Angiography;  Surgeon: Annice Needy, MD;  Location: [facility];  Service: Cardiovascular;  Laterality: Right;   TEMPORARY DIALYSIS CATHETER N/A 01/04/2022   Procedure: TEMPORARY DIALYSIS CATHETER;  Surgeon: Annice Needy, MD;  Location: [facility];  Service: Cardiovascular;  Laterality: N/A;    Family History  Problem Relation Age of Onset   Osteoarthritis Brother     Allergies  Allergen Reactions   Ace Inhibitors Swelling   Entresto [Sacubitril-Valsartan]  Swelling    Patient said she took both lisinopril and Entresto so it is not clear which one she actually reacted to.  For safety reasons, she has been advised to avoid both ACE inhibitors and Entresto.   Lisinopril Swelling    Severe Angioedema (requiring Nasotracheal intubation)   Penicillins Rash   Tomato Rash       Latest Ref Rng & Units 08/14/2023    8:00 PM 08/09/2023    8:33 AM 08/07/2023    8:36 AM  CBC  WBC 4.0 - 10.5 K/uL 7.3  6.7  7.1   Hemoglobin 12.0 - 15.0 g/dL 96.2  9.9  95.2   Hematocrit 36.0 - 46.0 % 31.6  31.0  31.3   Platelets 150 - 400 K/uL 267  256  273       CMP     Component Value Date/Time   NA 135 08/14/2023 2000   K 3.8 08/14/2023 2000   CL 94 (L) 08/14/2023 2000   CO2 31 08/14/2023 2000   GLUCOSE 112 (H) 08/14/2023 2000   BUN 24 (H) 08/14/2023 2000   CREATININE 4.12 (H) 08/14/2023 2000   CALCIUM 8.6 (L) 08/14/2023 2000   PROT 8.5 (H) 04/28/2023 1136   ALBUMIN 3.6 08/09/2023 0833   AST 16 04/28/2023 1136   ALT 15 04/28/2023 1136   ALKPHOS 113 04/28/2023 1136   BILITOT 0.5 04/28/2023 1136   GFRNONAA 11 (L) 08/14/2023 2000     No results found.     Assessment & Plan:   1. PAD (peripheral artery disease) (HCC) Today noninvasive studies show improvement from prior studies.  The patient's posterior tibial artery is occluded per angiogram which was consistent with studies today that she has from home monophasic anterior tibial artery waveforms with triphasic  peroneal waveforms and good toe waveforms to the right foot.  At this time we feel that she is medically optimized from vascular standpoint for any upcoming surgery.  2. Essential (primary) hypertension Continue antihypertensive medications as already ordered, these medications have been reviewed and there are no changes at this time.  3. End-stage renal disease on hemodialysis [facility]) Today the patient notes that her dialysis access is working well.  She has issues with 1 technician but otherwise she does not have any issues.  Will plan on following this up in 3 months   Current Outpatient Medications on File Prior to Visit  Medication Sig Dispense Refill   aspirin EC 81 MG tablet Take 81 mg by mouth daily.     clopidogrel (PLAVIX) 75 MG tablet Take 1 tablet (75 mg total) by mouth daily. 30 tablet 11   ferrous sulfate 325 (65 FE) MG EC tablet Take 325 mg by mouth in the morning.     metoprolol tartrate (LOPRESSOR) 25 MG tablet Take 1 tablet (25 mg total) by mouth 2 (two) times daily. 60 tablet 0   oxyCODONE-acetaminophen (PERCOCET) 5-325 MG tablet Take 1 tablet by mouth every 4 (four) hours as needed for severe pain (pain score 7-10). 5 tablet 0   sevelamer carbonate (RENVELA) 800 MG tablet Take 1 tablet (800 mg total) by mouth 3 (three) times daily with meals. 60 tablet 0   Vitamin D, Ergocalciferol, (DRISDOL) 1.25 MG (50000 UNIT) CAPS capsule Take 50,000 Units by mouth once a week. Wednesday     atorvastatin (LIPITOR) 40 MG tablet Take 40 mg by mouth daily. (Patient not taking: Reported on 08/08/2023)     No current facility-administered medications on file prior to  visit.    There are no Patient Instructions on file for this visit. No follow-ups on file.   Georgiana Spinner, NP

## 2023-08-21 LAB — VAS US ABI WITH/WO TBI
Left ABI: 1.18
Right ABI: 1.11

## 2023-08-27 ENCOUNTER — Telehealth (INDEPENDENT_AMBULATORY_CARE_PROVIDER_SITE_OTHER): Payer: Self-pay

## 2023-08-27 NOTE — Telephone Encounter (Signed)
Spoke with Deanna [name] at [facility], the patient is scheduled with Dr. Wyn Quaker for a permcath removal on 09/03/23 with a 2:00 pm arrival time to the [facility]. Pre-procedure instructions will be faxed to attention Deanna [name] at [facility].

## 2023-08-29 IMAGING — RF DG ESOPHAGUS
7 series · 14 of 18 positions shown · non-contrast
Comparison: None.

CLINICAL DATA: Some difficulty swallowing.  Pneumonia.

EXAM:
ESOPHOGRAM/BARIUM SWALLOW
TECHNIQUE: Single contrast examination was performed using  thin barium.
FLUOROSCOPY:
Radiation Exposure Index (as provided by the fluoroscopic device):
42 seconds. 22.90 MGy Kerma

[Series 1: fluoro_barium 2fps_bw · 0.17mm/px · 2 of 2 frames shown (1 of 4)]
[frame 1/2]
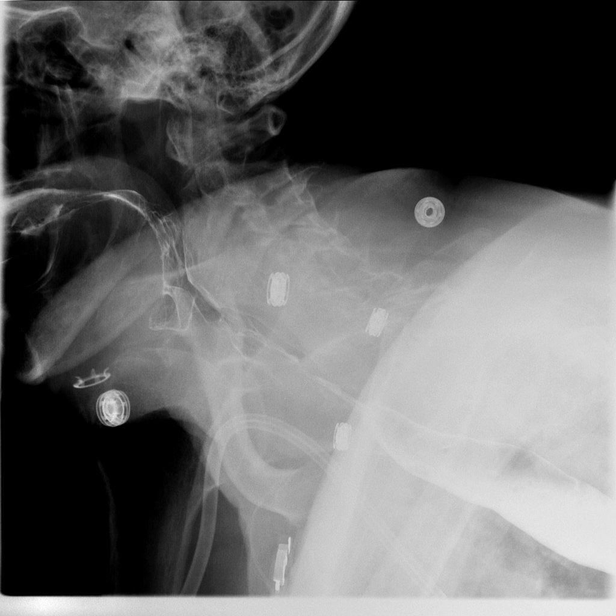
[frame 2/2]
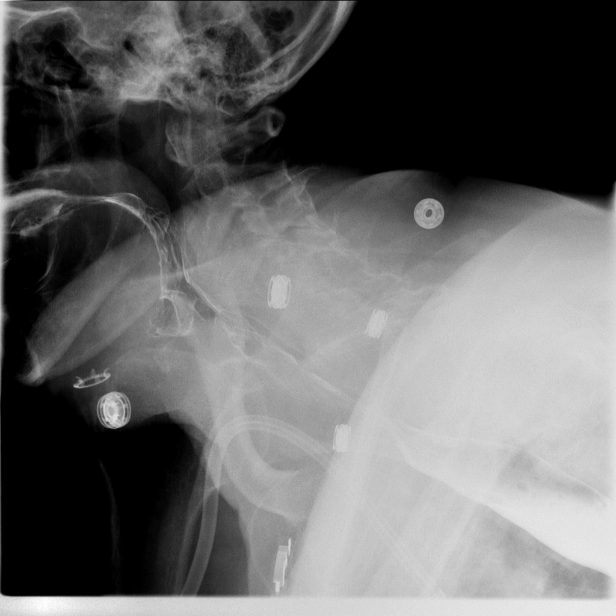

[Series 2: fluoro_barium 2fps_bw · 0.17mm/px · 1 of 2 frames shown (2 of 4)]
[frame 2/2]
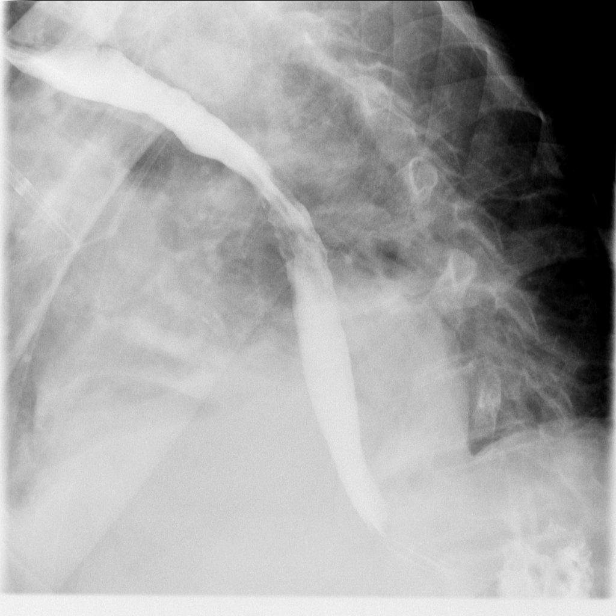

[Series 3: fluoro_barium 2fps_bw · 0.17mm/px · 1 of 1 slices shown (3 of 4)]
[im 1/1]
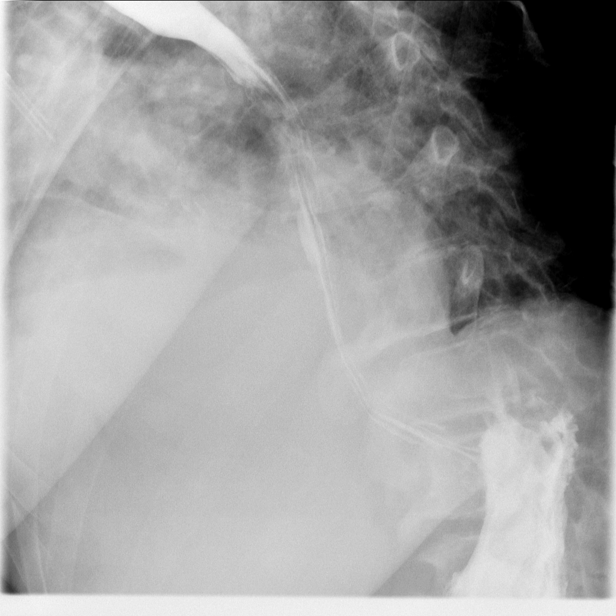

[Series 4: fluoro_barium 2fps_bw · 0.17mm/px · 3 of 9 frames shown (4 of 4)]
[frame 2/9]
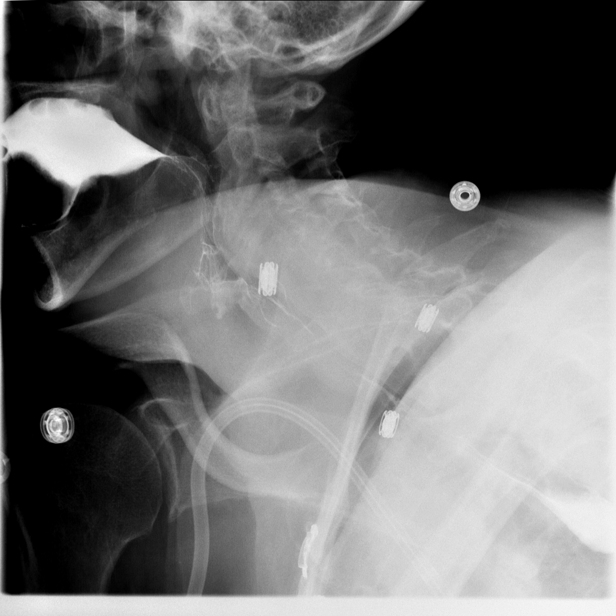
[frame 5/9]
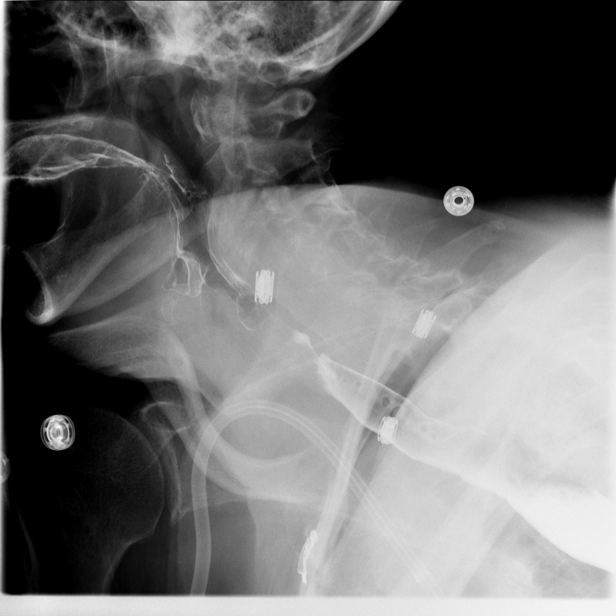
[frame 8/9]
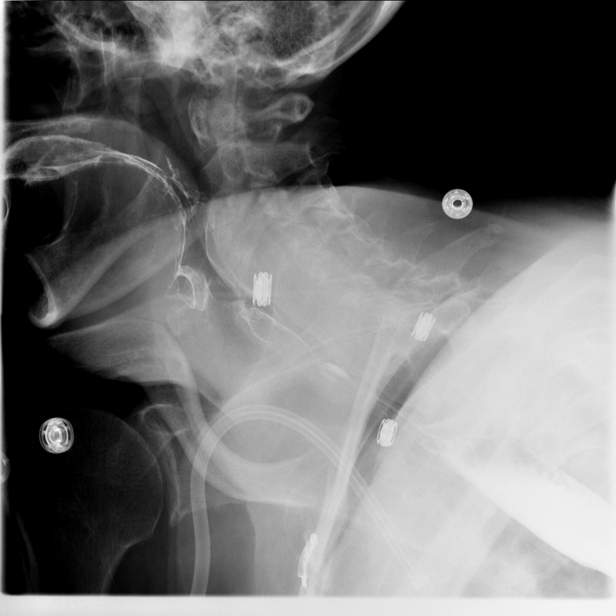

[Series 5: cp_standard · 0.17mm/px · 1 of 1 slices shown (1 of 3)]
[im 1/1]
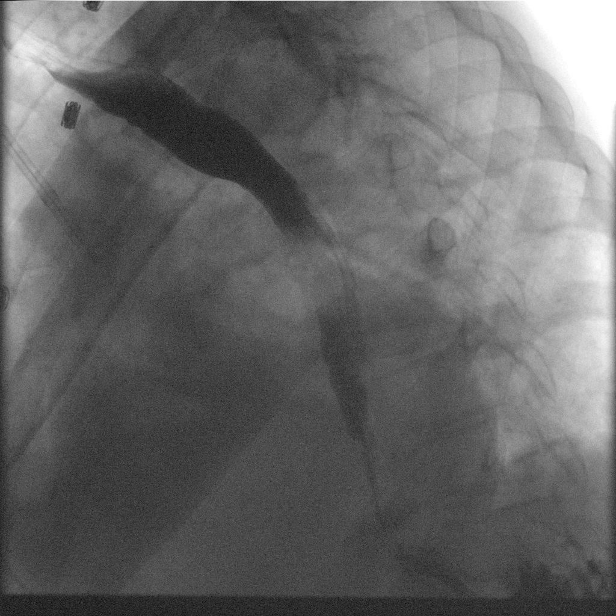

[Series 6: cp_standard · 0.17mm/px · 3 of 37 frames shown (2 of 3)]
[frame 6/37]
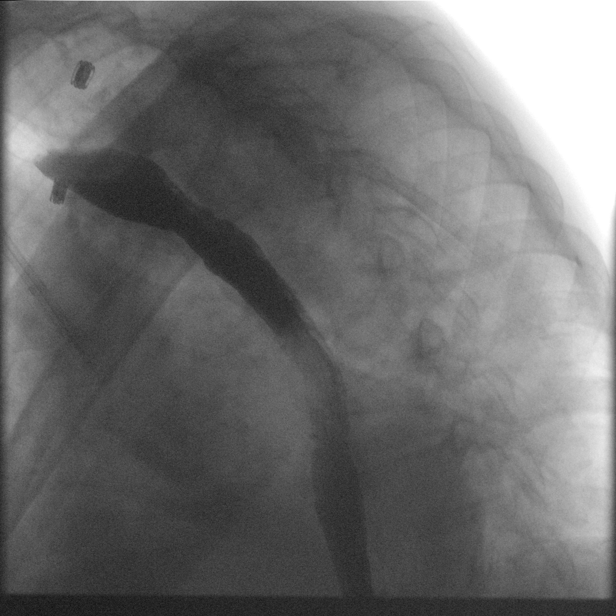
[frame 26/37]
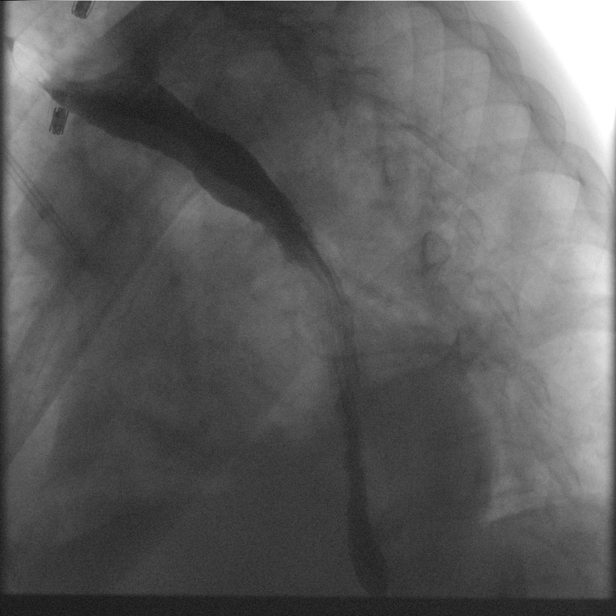
[frame 32/37]
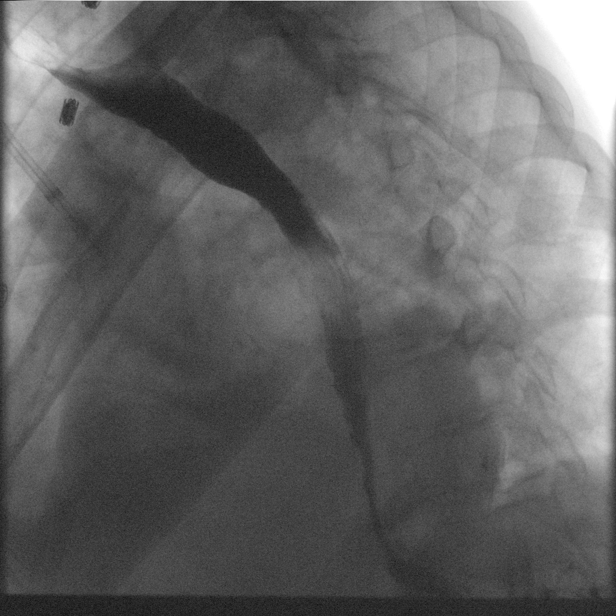

[Series 7: cp_standard · 0.17mm/px · 3 of 56 frames shown (3 of 3)]
[frame 9/56]
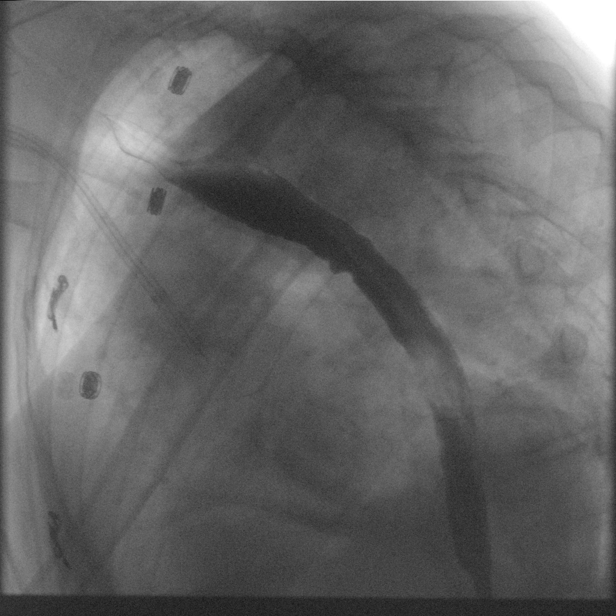
[frame 29/56]
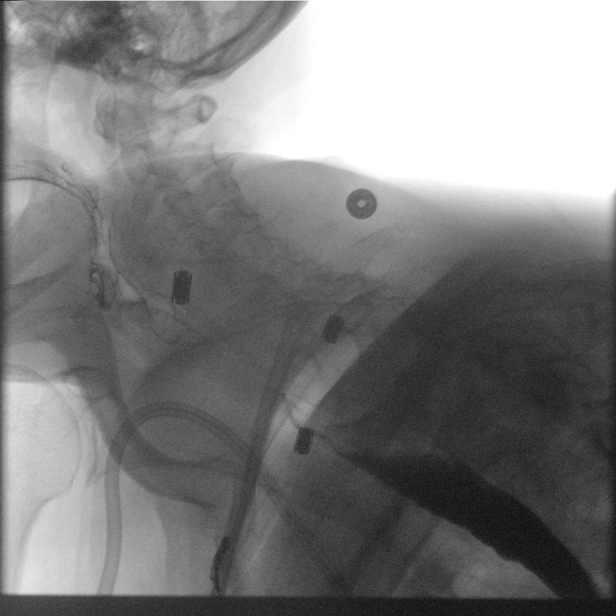
[frame 48/56]
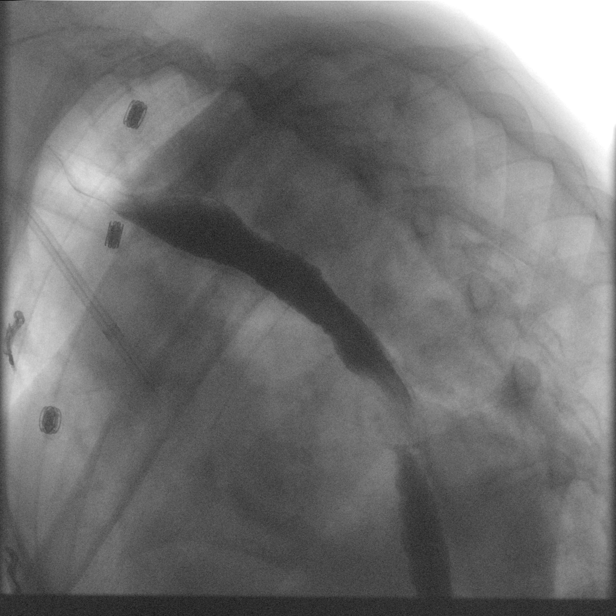

[14 of 18 positions shown; findings below may reference images not displayed]

FINDINGS: Patient was difficult to position for the examination. A sufficient
examination was achieved. [place] pharyngeal phase is normal. The
patient had no difficulty with initiation. There is no penetration
or aspiration. No cardiac a pharyngeal stenosis. No diverticular
disease. No obstructing lesion of the cervical or thoracic
esophagus. Patient has mild age related presbyesophagus. No hiatal
hernia.
IMPRESSION: Negative study for age. No penetration or aspiration. No fixed
stenosis. No hiatal hernia. Mild age related presbyesophagus.

## 2023-09-03 ENCOUNTER — Encounter
Admission: RE | Disposition: A | Discharge status: Skilled Nursing Facility | Payer: Self-pay | Source: Home / Self Care | Attending: Vascular Surgery

## 2023-09-03 ENCOUNTER — Encounter: Payer: Self-pay | Admitting: Vascular Surgery

## 2023-09-03 ENCOUNTER — Ambulatory Visit
Admission: RE | Admit: 2023-09-03 | Discharge: 2023-09-03 | Disposition: A | Discharge status: Skilled Nursing Facility | Payer: Medicare HMO | Attending: Vascular Surgery | Admitting: Vascular Surgery

## 2023-09-03 ENCOUNTER — Ambulatory Visit (INDEPENDENT_AMBULATORY_CARE_PROVIDER_SITE_OTHER): Payer: Medicare HMO | Admitting: Neurology

## 2023-09-03 DIAGNOSIS — N186 End stage renal disease: Secondary | ICD-10-CM | POA: Insufficient documentation

## 2023-09-03 DIAGNOSIS — I132 Hypertensive heart and chronic kidney disease with heart failure and with stage 5 chronic kidney disease, or end stage renal disease: Secondary | ICD-10-CM | POA: Diagnosis not present

## 2023-09-03 DIAGNOSIS — Z95828 Presence of other vascular implants and grafts: Secondary | ICD-10-CM

## 2023-09-03 DIAGNOSIS — R202 Paresthesia of skin: Secondary | ICD-10-CM

## 2023-09-03 DIAGNOSIS — I5040 Unspecified combined systolic (congestive) and diastolic (congestive) heart failure: Secondary | ICD-10-CM | POA: Insufficient documentation

## 2023-09-03 DIAGNOSIS — M5412 Radiculopathy, cervical region: Secondary | ICD-10-CM

## 2023-09-03 DIAGNOSIS — E1151 Type 2 diabetes mellitus with diabetic peripheral angiopathy without gangrene: Secondary | ICD-10-CM | POA: Insufficient documentation

## 2023-09-03 DIAGNOSIS — Z452 Encounter for adjustment and management of vascular access device: Secondary | ICD-10-CM | POA: Diagnosis present

## 2023-09-03 DIAGNOSIS — G629 Polyneuropathy, unspecified: Secondary | ICD-10-CM

## 2023-09-03 DIAGNOSIS — E1122 Type 2 diabetes mellitus with diabetic chronic kidney disease: Secondary | ICD-10-CM | POA: Insufficient documentation

## 2023-09-03 DIAGNOSIS — Z992 Dependence on renal dialysis: Secondary | ICD-10-CM | POA: Diagnosis not present

## 2023-09-03 DIAGNOSIS — G5621 Lesion of ulnar nerve, right upper limb: Secondary | ICD-10-CM

## 2023-09-03 HISTORY — PX: DIALYSIS/PERMA CATHETER REMOVAL: CATH118289

## 2023-09-03 LAB — GLUCOSE, CAPILLARY: Glucose-Capillary: 106 mg/dL — ABNORMAL HIGH (ref 70–99)

## 2023-09-03 SURGERY — DIALYSIS/PERMA CATHETER REMOVAL
Anesthesia: LOCAL

## 2023-09-03 MED ORDER — LIDOCAINE-EPINEPHRINE (PF) 1 %-1:200000 IJ SOLN
INTRAMUSCULAR | Status: DC | PRN
  Administered 2023-09-03: 20 mL

## 2023-09-03 SURGICAL SUPPLY — 4 items
CHLORAPREP W/TINT 26 (MISCELLANEOUS) IMPLANT
FORCEPS HALSTEAD CVD 5IN STRL (INSTRUMENTS) IMPLANT
SCALPEL PROTECTED #11 DISP (BLADE) IMPLANT
TRAY LACERAT/PLASTIC (MISCELLANEOUS) IMPLANT

## 2023-09-03 NOTE — Procedures (Signed)
[facility]  [address] [place], [address]  [place], [place] [address] Tel: [phone] Fax: [phone] Test Date:  09/03/2023  Patient: Deanna [name] DOB: 02/09/1951 Physician: Jacquelyne Balint, MD  Sex: Female Height: 6\' 1"  Ref Phys: Ernestine Mcmurray, MD  ID#: [id number]   Technician:    History: This is a 72 year old female with numbness and pain in neck and arms.  NCV & EMG Findings: Extensive electrodiagnostic evaluation of bilateral upper limbs shows: Bilateral median and ulnar sensory responses are absent. Bilateral radial sensory responses show reduced amplitude (L9, R9 V). Bilateral ulnar (ADM) motor responses show reduced amplitude (L5.4, R3.0 mV). Right ulnar motor response also shows decreased conduction velocity from above elbow to below elbow stimulation site (33 m/s). Chronic motor axon loss changes without accompanying active denervation changes are seen in bilateral first dorsal interosseous, bilateral extensor indicis proprius, right abductor digiti minimi, and bilateral cervical (C7 level) paraspinal muscles.  Impression: This is a complex study. 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    ___________________________ Jacquelyne Balint, MD    Nerve Conduction Studies Motor Nerve Results    Latency Amplitude F-Lat Segment Distance CV Comment  Site (ms) Norm (mV) Norm (ms)  (cm) (m/s) Norm   Left Median (APB) Motor  Wrist 4.0  < 4.0 5.2  > 5.0        Elbow 10.0 - 4.6 -  Elbow-Wrist 31  52  > 50   Right Median (APB) Motor  Wrist 3.9  < 4.0 7.6  > 5.0        Elbow 9.3 - 7.1 -  Elbow-Wrist 27 50  > 50   Left Ulnar (ADM) Motor  Wrist 2.2  < 3.1 *5.4  > 7.0        Bel elbow 6.8 - 4.8 -  Bel elbow-Wrist 25 54  > 50   Ab elbow 8.8 - 4.4 -  Ab elbow-Bel elbow 10 50 -   Right Ulnar (ADM) Motor  Wrist 2.2  < 3.1 *3.0  > 7.0        Bel elbow 7.7 - 2.5 -  Bel elbow-Wrist 24.5 *45  > 50   Ab elbow 10.7 - 2.6 -  Ab elbow-Bel elbow 10 *33 -    Sensory Sites    Neg Peak Lat Amplitude (O-P) Segment Distance Velocity Comment  Site (ms) Norm (V) Norm  (cm) (ms)   Left Median Sensory  Wrist-Dig II *NR  < 3.8 *NR  > 10 Wrist-Dig II 13    Right Median Sensory  Wrist-Dig II *NR  < 3.8 *NR  > 10 Wrist-Dig II 13    Left Radial Sensory  Forearm-Wrist 1.90  < 2.8 *9  > 10 Forearm-Wrist 10    Right Radial Sensory  Forearm-Wrist 2.4  < 2.8 *9  > 10 Forearm-Wrist 10    Left Ulnar Sensory  Wrist-Dig V *NR  < 3.2 *NR  > 5 Wrist-Dig V 11    Right Ulnar Sensory  Wrist-Dig V *NR  < 3.2 *NR  > 5 Wrist-Dig  V 11     Electromyography   Side Muscle Ins.Act Fibs Fasc Recrt Amp Dur Poly Activation Comment  Right FDI Nml Nml Nml *SMU *1+ *1+ Nml Nml N/A  Right EIP Nml Nml Nml *2- *1+ *1+ Nml Nml N/A  Right Biceps Nml Nml Nml Nml Nml Nml Nml Nml N/A  Right Triceps Nml Nml Nml Nml Nml Nml Nml Nml N/A  Right Deltoid Nml Nml Nml Nml Nml Nml Nml Nml N/A  Right C7 PSP Nml Nml Nml *1- *1+ *1+ Nml Nml N/A  Right ADM Nml Nml Nml *2- *1+ *1+ Nml Nml N/A  Left FDI Nml Nml Nml *1- *1+ *1+ Nml Nml N/A  Left EIP Nml Nml Nml *2- *1+ *1+ Nml Nml N/A  Left Pronator teres Nml Nml Nml Nml Nml Nml Nml Nml N/A  Left Biceps Nml Nml Nml Nml Nml Nml Nml Nml N/A  Left Triceps Nml Nml Nml Nml Nml Nml Nml Nml N/A  Left Deltoid Nml Nml Nml Nml Nml Nml Nml Nml N/A  Left C7 PSP Nml Nml Nml *1- *1+ *1+ Nml Nml N/A      Waveforms:  Motor           Sensory

## 2023-09-03 NOTE — Discharge Instructions (Signed)
Tunneled Catheter Removal, Care After Refer to this sheet in the next few weeks. These instructions provide you with information about caring for yourself after your procedure. Your health care provider may also give you more specific instructions. Your treatment has been planned according to current medical practices, but problems sometimes occur. Call your health care provider if you have any problems or questions after your procedure. What can I expect after the procedure? After the procedure, it is common to have: Some mild redness, swelling, and pain around your catheter site.   Follow these instructions at home: Incision care  Check your removal site  every day for signs of infection. Check for: More redness, swelling, or pain. More fluid or blood. Warmth. Pus or a bad smell. Remove your dressing in 48hrs leave open to air  Activity  Return to your normal activities as told by your health care provider. Ask your health care provider what activities are safe for you. Do not lift anything that is heavier than 10 lb (4.5 kg) for 3 days  You may shower tomorrow  Contact a health care provider if: You have more fluid or blood coming from your removal site You have more redness, swelling, or pain at your incisions or around the area where your catheter was removed Your removal site feel warm to the touch. You feel unusually weak. You feel nauseous.. Get help right away if You have swelling in your arm, shoulder, neck, or face. You develop chest pain. You have difficulty breathing. You feel dizzy or light-headed. You have pus or a bad smell coming from your removal site You have a fever. You develop bleeding from your removal site, and your bleeding does not stop. This information is not intended to replace advice given to you by your health care provider. Make sure you discuss any questions you have with your health care provider. Document Released: 08/28/2012 Document Revised:  05/14/2016 Document Reviewed: 06/07/2015 Elsevier Interactive Patient Education  2017 Elsevier Inc. 

## 2023-09-03 NOTE — Op Note (Signed)
Operative Note     Preoperative diagnosis:   1. ESRD with functional permanent access  Postoperative diagnosis:  1. ESRD with functional permanent access  Procedure:  Removal of right jugular Permcath  Surgeon:  Festus Barren, MD  Anesthesia:  Local  EBL:  Minimal  Indication for the Procedure:  The patient has a functional permanent dialysis access and no longer needs their permcath.  This can be removed.  Risks and benefits are discussed and informed consent is obtained.  Description of the Procedure:  The patient's right neck, chest and existing catheter were sterilely prepped and draped. The area around the catheter was anesthetized copiously with 1% lidocaine. The catheter was dissected out with curved hemostats until the cuff was freed from the surrounding fibrous sheath. The fiber sheath was transected, and the catheter was then removed in its entirety using gentle traction. Pressure was held and sterile dressings were placed. The patient tolerated the procedure well and was taken to the recovery room in stable condition.     Festus Barren  09/03/2023, 2:54 PM This note was created with Dragon Medical transcription system. Any errors in dictation are purely unintentional.

## 2023-09-03 NOTE — Interval H&P Note (Signed)
History and Physical Interval Note:  09/03/2023 2:06 PM  Deanna [name]  has presented today for surgery, with the diagnosis of Perma Cath Removal   End Stage Renal.  The various methods of treatment have been discussed with the patient and family. After consideration of risks, benefits and other options for treatment, the patient has consented to  Procedure(s): DIALYSIS/PERMA CATHETER REMOVAL (N/A) as a surgical intervention.  The patient's history has been reviewed, patient examined, no change in status, stable for surgery.  I have reviewed the patient's chart and labs.  Questions were answered to the patient's satisfaction.     Festus Barren

## 2023-09-04 ENCOUNTER — Encounter: Payer: Self-pay | Admitting: Vascular Surgery

## 2023-09-05 ENCOUNTER — Other Ambulatory Visit: Payer: Self-pay

## 2023-09-05 ENCOUNTER — Ambulatory Visit: Payer: Medicare HMO | Admitting: Neurosurgery

## 2023-09-05 ENCOUNTER — Telehealth: Payer: Self-pay | Admitting: Neurosurgery

## 2023-09-05 VITALS — BP 130/78 | Ht 73.0 in | Wt 159.0 lb

## 2023-09-05 DIAGNOSIS — G5621 Lesion of ulnar nerve, right upper limb: Secondary | ICD-10-CM | POA: Diagnosis not present

## 2023-09-05 DIAGNOSIS — Z01818 Encounter for other preprocedural examination: Secondary | ICD-10-CM

## 2023-09-05 NOTE — Telephone Encounter (Signed)
Jethro Bastos at [facility] [phone]  B-Hall coordinator She read the patient's AVS. Patient no longer goes to [facility]. You need to send the clearance to the doctor at [facility]. Fax is [phone] Any questions can be directed to [facility].

## 2023-09-05 NOTE — Telephone Encounter (Signed)
Sharlot Gowda, RN  P Cns-Neurosurgery Admin Please contact her and ask her where she goes for dialysis and for the dialysis center's phone #. Thanks!  [facility] on [address] phone [phone]

## 2023-09-05 NOTE — Progress Notes (Signed)
Date of initial consult: 05/01/2023  HISTORY OF PRESENT ILLNESS: 09/05/2023 [name] is a 72 year old woman with a very complicated past medical history who is here today to follow-up.   As previously involved in her care for a cervical fracture.  During follow-up for her cervical fracture she is complaining about isolated hand weakness.  As part of her evaluation we took her for nerve conduction/EMG study which demonstrated a ulnar neuropathy at the elbow on the right.  She states that her hand continues to get weaker and continues to have pain shooting down into the ulnar aspect of her arm.  PHYSICAL EXAMINATION:   Vitals:   09/05/23 1125  BP: 130/78   General: Patient is well developed, well nourished, calm, collected, and in no apparent distress.  NEUROLOGICAL:  General: In no acute distress.  Awake, alert, oriented to person, place, and time. Pupils equal round and reactive to light.   Strength: She shows evidence of ulnar clawing with a positive Wartenberg's and positive Froment's sign.  She has wasting of her first dorsal interosseous musculature.  Her grip strength is strong but her finger abduction is showing significant weakness.   ROS (Neurologic): Negative except as noted above  IMAGING/electrodiagnostics:   [facility]  [address] [place], [address]  [place], [place] [address] Tel: [phone] Fax: [phone] Test Date:  09/03/2023   Patient: Deanna [name] DOB: [birth year] Physician: Jacquelyne Balint, MD  Sex: Female Height: 6\' 1"  Ref Phys: Ernestine Mcmurray, MD  ID#: [id number]     Technician:      History: This is a 72 year old female with numbness and pain in neck and arms.   NCV & EMG Findings: Extensive electrodiagnostic evaluation of bilateral upper limbs shows: Bilateral median and ulnar sensory responses are absent. Bilateral radial sensory responses show reduced amplitude (L9, R9 V). Bilateral ulnar (ADM) motor responses show  reduced amplitude (L5.4, R3.0 mV). Right ulnar motor response also shows decreased conduction velocity from above elbow to below elbow stimulation site (33 m/s). Chronic motor axon loss changes without accompanying active denervation changes are seen in bilateral first dorsal interosseous, bilateral extensor indicis proprius, right abductor digiti minimi, and bilateral cervical (C7 level) paraspinal muscles.   Impression: This is a complex study. 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       ___________________________ Jacquelyne Balint, MD    ASSESSMENT/PLAN:  Deanna [name] is doing well with her conservative management for her C3 anterior osteophyte fracture.  During follow-up with her she was complaining of symptoms could significant of either cervical radiculopathy or a ulnar neuropathy.  We sent her for EMG and nerve conduction study.  This is positive for chronic C8 lesion as well as a active ulnar right sided mononeuropathy with decreased conduction velocity across the elbow.  On physical examination she has wasting in her first dorsal interosseous, ADM, and also has ulnar clawing noted.  She has significant interosseous weakness.  She has a severe Tinel sign at the elbow.  Given these findings and the rapidity with which they are  worsening we will plan for ulnar decompression at the elbow and AIN to ulnar nerve transfer.  The risk and benefits were discussed with her.  She would like to go forward with  surgery.  Lovenia Kim, MD/MSCR Department of Neurosurgery

## 2023-09-05 NOTE — Telephone Encounter (Signed)
[facility] # in previous message is not in service. Per [employer], the # is [phone], option 4.  I called [facility] and spoke with the charge nurse Annabelle Harman). The patient sees them on Tuesdays, Thursdays, and Saturdays. They will plan to bring her in on 10/03/23 and will hold heparin on 10/03/23 and 10/06/23. Discussed with Dr Katrinka Blazing and Quentin Mulling, NP - all are in agreement with this.

## 2023-09-05 NOTE — Telephone Encounter (Signed)
I spoke with Devetta at [facility] regarding the plan for dialysis and the need to hold plavix 7 days before and after.

## 2023-09-05 NOTE — Patient Instructions (Signed)
Please see below for information in regards to your upcoming surgery:   Planned surgery: ulnar nerve decompression, anterior interosseous nerve to ulnar nerve transfer, end to side   Surgery date: 10/04/23 at [facility] (Medical Mall: [address] [place], [place] [address]) - you will find out your arrival time the business day before your surgery.   Pre-op appointment at [facility] Pre-admit Testing: we will call you with a date/time for this. If you are scheduled for an in person appointment, Pre-admit Testing is located on the first floor of the [facility] building, [address], [address]. Please bring all prescriptions in the original prescription bottles to your appointment. During this appointment, they will advise you which medications you can take the morning of surgery, and which medications you will need to hold for surgery. Labs (such as blood work, EKG) may be done at your pre-op appointment. You are not required to fast for these labs. Should you need to change your pre-op appointment, please call Pre-admit testing at [phone].     Blood thinners:   Aspirin: ok to stay on aspirin 81mg   Plavix:     Stop Plavix 7 days prior, resume Plavix 7 days after     Surgical clearance: we will send a clearance form to Dr Wyn Quaker and Phineas Real [facility]. They may wish to see you in their office prior to signing the clearance form. If so, they may call you to schedule an appointment.    How to contact us:  If you have any questions/concerns before or after surgery, you can reach [place] at [phone], or you can send a mychart message. We can be reached by phone or mychart 8am-4pm, Monday-Friday.  *Please note: Calls after 4pm are forwarded to a third party answering service. Mychart messages are not routinely monitored during evenings, weekends, and holidays. Please call our office to contact the answering service for urgent concerns  during non-business hours.     If you have FMLA/disability paperwork, please drop it off or fax it to [phone], attention Patty.   Appointments/FMLA & disability paperwork: Joycelyn Rua, & Flonnie Hailstone Registered Nurse/Surgery scheduler: Royston Cowper Medical Assistants: Nash Mantis Physician Assistants: Joan Flores, PA-C, Manning Charity, PA-C & Drake Leach, PA-C Surgeons: Venetia Night, MD & Ernestine Mcmurray, MD

## 2023-09-07 ENCOUNTER — Encounter: Payer: Self-pay | Admitting: Emergency Medicine

## 2023-09-10 ENCOUNTER — Ambulatory Visit: Admission: RE | Admit: 2023-09-10 | Payer: Medicare HMO | Source: Home / Self Care

## 2023-09-10 SURGERY — COLONOSCOPY WITH PROPOFOL
Anesthesia: General

## 2023-09-14 ENCOUNTER — Ambulatory Visit (INDEPENDENT_AMBULATORY_CARE_PROVIDER_SITE_OTHER): Payer: Medicare HMO

## 2023-09-14 DIAGNOSIS — I70219 Atherosclerosis of native arteries of extremities with intermittent claudication, unspecified extremity: Secondary | ICD-10-CM

## 2023-09-24 ENCOUNTER — Encounter
Admission: RE | Admit: 2023-09-24 | Discharge: 2023-09-24 | Disposition: A | Discharge status: Home / Self Care | Payer: Medicare HMO | Source: Ambulatory Visit | Attending: Neurosurgery | Admitting: Neurosurgery

## 2023-09-24 ENCOUNTER — Encounter: Payer: Self-pay | Admitting: Neurosurgery

## 2023-09-24 ENCOUNTER — Telehealth: Payer: Self-pay

## 2023-09-24 ENCOUNTER — Other Ambulatory Visit: Payer: Self-pay

## 2023-09-24 VITALS — BP 141/64 | HR 72 | Temp 97.7°F | Ht 73.0 in

## 2023-09-24 DIAGNOSIS — D631 Anemia in chronic kidney disease: Secondary | ICD-10-CM

## 2023-09-24 DIAGNOSIS — Z01812 Encounter for preprocedural laboratory examination: Secondary | ICD-10-CM

## 2023-09-24 HISTORY — DX: Fatigue fracture of vertebra, cervical region, initial encounter for fracture: M48.42XA

## 2023-09-24 NOTE — Telephone Encounter (Addendum)
Received fax from Sheppard Plumber, NP: "patient had angiogram w/ intervention on 07/23/23. We recommend continuing plavix and ASA for 90 days post intervention. This would be after 10/23/23".  Per discussion with Dr Katrinka Blazing, we will postpone surgery. New surgery date: 11/08/23. I have canceled the previously scheduled post op appointments (10/17/23, 11/14/23, 12/24/23). I have scheduled new appointments for 11/21/23 at 9am and 12/19/23 at 9:30am. Per discussion with Joan Flores, PA-C, a 3rd appointment can be scheduled when she comes in on 12/19/23 if needed.  She can stay on her aspirin 81mg  for surgery, but will need to hold plavix 7 days prior and 7 days after.  I spoke with Roger Shelter, RN at [facility], to inform him of the above. They are at capacity and do not have the ability to bring her in for dialysis on a different day in February or for the foreseeable future. She would have to miss a day or go to a different center. However, the other centers are likely to have the same issue. Therefore, she will have to skip dialysis on 11/08/23 and they will hold heparin on 11/07/23 and 11/10/23. He will discuss this with their doctor for clearance for this. He will also discuss this with the patient at her appointment tomorrow.    I spoke with Efraim Kaufmann at [facility] about the above information. I have also faxed a copy of this note to her at [phone]. Mr Palleschi was unavailable at the time of my call. Melissa will relay the above information to Ms Jamerson when she returns.

## 2023-09-24 NOTE — Patient Instructions (Addendum)
Your procedure is scheduled on:  Jan 05/2024, Thursday Report to the Registration Desk on the 1st floor of the [facility] To find out your arrival time, please call [phone] between 1PM - 3PM on:  Oct 03, 2023 Wednesday. If your arrival time is 6:00 am, do not arrive before that time as the Medical Mall entrance doors do not open until 6:00 am.  REMEMBER: Instructions that are not followed completely may result in serious medical risk, up to and including death; or upon the discretion of your surgeon and anesthesiologist your surgery may need to be rescheduled.  Do not eat food after midnight the night before surgery.  No gum chewing or hard candies.  You may however, drink CLEAR liquids up to 2 hours before you are scheduled to arrive for your surgery. Do not drink anything within 2 hours of your scheduled arrival time.  Clear liquids include: - water    One week prior to surgery: Stop Anti-inflammatories (NSAIDS) such as Advil, Aleve, Ibuprofen, Motrin, Naproxen, Naprosyn and Aspirin based products such as Excedrin, Goody's Powder, BC Powder. Stop ANY OVER THE COUNTER supplements until after surgery like, ferrous sulfate, Vit D,  You may however, continue to take Tylenol if needed for pain up until the day of surgery.   **Follow recommendations regarding stopping blood thinners.**  Aspirin: ok to stay on aspirin 81 mg. Continue  as prescribed.   Plavix:     Stop Plavix 7 days prior. Last dose will be January 1 /2024.  Ask surgeon about resuming of Plavix.     Continue taking all of your other prescription medications up until the day of surgery.  ON THE DAY OF SURGERY ONLY TAKE THESE MEDICATIONS WITH SIPS OF WATER:  atorvastatin (LIPITOR) metoprolol tartrate (LOPRESSOR)  oxyCODONE-acetaminophen (PERCOCET) gabapentin (NEURONTIN  No Alcohol for 24 hours before or after surgery.  No Smoking including e-cigarettes for 24 hours before surgery.  No chewable tobacco  products for at least 6 hours before surgery.  No nicotine patches on the day of surgery.  Do not use any "recreational" drugs for at least a week (preferably 2 weeks) before your surgery.  Please be advised that the combination of cocaine and anesthesia may have negative outcomes, up to and including death. If you test positive for cocaine, your surgery will be cancelled.  On the morning of surgery brush your teeth with toothpaste and water, you may rinse your mouth with mouthwash if you wish. Do not swallow any toothpaste or mouthwash.  Use CHG Soap or wipes as directed on instruction sheet.- wipes are provided for you   Do not wear jewelry, make-up, hairpins, clips or nail polish.  For welded (permanent) jewelry: bracelets, anklets, waist bands, etc.  Please have this removed prior to surgery.  If it is not removed, there is a chance that hospital personnel will need to cut it off on the day of surgery.  Do not wear lotions, powders, or perfumes.   Do not shave body hair from the neck down 48 hours before surgery.  Contact lenses, hearing aids and dentures may not be worn into surgery.  Do not bring valuables to the hospital. [facility] is not responsible for any missing/lost belongings or valuables.    Notify your doctor if there is any change in your medical condition (cold, fever, infection).  Wear comfortable clothing (specific to your surgery type) to the hospital.  After surgery, you can help prevent lung complications by doing breathing exercises.  Take deep breaths and cough every 1-2 hours. Your doctor may order a device called an Incentive Spirometer to help you take deep breaths.  If you are being admitted to the hospital overnight, leave your suitcase in the car. After surgery it may be brought to your room.  In case of increased patient census, it may be necessary for you, the patient, to continue your postoperative care in the Same Day Surgery department.  If you  are being discharged the day of surgery, you will not be allowed to drive home. You will need a responsible individual to drive you home and stay with you for 24 hours after surgery.   If you are taking public transportation, you will need to have a responsible individual with you.  Please call the Pre-admissions Testing Dept. at [phone] if you have any questions about these instructions.  Surgery Visitation Policy:  Patients having surgery or a procedure may have two visitors.  Children under the age of 46 must have an adult with them who is not the patient.    Preparing the Skin Before Surgery     To help prevent the risk of infection at your surgical site, we are now providing you with rinse-free Sage 2% Chlorhexidine Gluconate (CHG) disposable wipes.  Chlorhexidine Gluconate (CHG) Soap  o An antiseptic cleaner that kills germs and bonds with the skin to continue killing germs even after washing  o Used for showering the night before surgery and morning of surgery  The night before surgery: Shower or bathe with warm water. Do not apply perfume, lotions, powders. Wait one hour after shower. Skin should be dry and cool. Open Sage wipe package - use 6 disposable cloths. Wipe body using one cloth for the right arm, one cloth for the left arm, one cloth for the right leg, one cloth for the left leg, one cloth for the chest/abdomen area, and one cloth for the back. Do not use on open wounds or sores. Do not use on face or genitals (private parts). If you are breast feeding, do not use on breasts. 5. Do not rinse, allow to dry. 6. Skin may feel "tacky" for several minutes. 7. Dress in clean clothes. 8. Place clean sheets on your bed and do not sleep with pets.  REPEAT ABOVE ON THE MORNING OF SURGERY BEFORE ARRIVING TO THE HOSPITAL.

## 2023-10-01 NOTE — Telephone Encounter (Signed)
 Received call from [place] at [facility] that they can get her for dialysis on 11/07/23 so she wont have to skip any dialysis that week. He will notify her facility.

## 2023-10-11 ENCOUNTER — Encounter: Payer: Self-pay | Admitting: Intensive Care

## 2023-10-11 ENCOUNTER — Emergency Department: Payer: 59

## 2023-10-11 ENCOUNTER — Other Ambulatory Visit: Payer: Self-pay

## 2023-10-11 DIAGNOSIS — Z992 Dependence on renal dialysis: Secondary | ICD-10-CM | POA: Diagnosis not present

## 2023-10-11 DIAGNOSIS — I69354 Hemiplegia and hemiparesis following cerebral infarction affecting left non-dominant side: Secondary | ICD-10-CM | POA: Insufficient documentation

## 2023-10-11 DIAGNOSIS — E1122 Type 2 diabetes mellitus with diabetic chronic kidney disease: Secondary | ICD-10-CM | POA: Insufficient documentation

## 2023-10-11 DIAGNOSIS — I6782 Cerebral ischemia: Secondary | ICD-10-CM | POA: Diagnosis not present

## 2023-10-11 DIAGNOSIS — M79605 Pain in left leg: Secondary | ICD-10-CM | POA: Diagnosis not present

## 2023-10-11 DIAGNOSIS — M79602 Pain in left arm: Secondary | ICD-10-CM | POA: Insufficient documentation

## 2023-10-11 DIAGNOSIS — N186 End stage renal disease: Secondary | ICD-10-CM | POA: Diagnosis not present

## 2023-10-11 DIAGNOSIS — R200 Anesthesia of skin: Secondary | ICD-10-CM | POA: Diagnosis present

## 2023-10-11 DIAGNOSIS — R531 Weakness: Secondary | ICD-10-CM | POA: Insufficient documentation

## 2023-10-11 DIAGNOSIS — I120 Hypertensive chronic kidney disease with stage 5 chronic kidney disease or end stage renal disease: Secondary | ICD-10-CM | POA: Insufficient documentation

## 2023-10-11 LAB — COMPREHENSIVE METABOLIC PANEL
ALT: 21 U/L (ref 0–44)
AST: 19 U/L (ref 15–41)
Albumin: 3.7 g/dL (ref 3.5–5.0)
Alkaline Phosphatase: 130 U/L — ABNORMAL HIGH (ref 38–126)
Anion gap: 15 (ref 5–15)
BUN: 34 mg/dL — ABNORMAL HIGH (ref 8–23)
CO2: 26 mmol/L (ref 22–32)
Calcium: 8.5 mg/dL — ABNORMAL LOW (ref 8.9–10.3)
Chloride: 98 mmol/L (ref 98–111)
Creatinine, Ser: 4.86 mg/dL — ABNORMAL HIGH (ref 0.44–1.00)
GFR, Estimated: 9 mL/min — ABNORMAL LOW (ref >60)
Glucose, Bld: 91 mg/dL (ref 70–99)
Potassium: 4.2 mmol/L (ref 3.5–5.1)
Sodium: 139 mmol/L (ref 135–145)
Total Bilirubin: 0.5 mg/dL (ref 0.0–1.2)
Total Protein: 7.9 g/dL (ref 6.5–8.1)

## 2023-10-11 LAB — DIFFERENTIAL
Abs Immature Granulocytes: 0.06 10*3/uL (ref 0.00–0.07)
Basophils Absolute: 0 10*3/uL (ref 0.0–0.1)
Basophils Relative: 1 %
Eosinophils Absolute: 0.3 10*3/uL (ref 0.0–0.5)
Eosinophils Relative: 3 %
Immature Granulocytes: 1 %
Lymphocytes Relative: 18 %
Lymphs Abs: 1.4 10*3/uL (ref 0.7–4.0)
Monocytes Absolute: 1 10*3/uL (ref 0.1–1.0)
Monocytes Relative: 13 %
Neutro Abs: 5 10*3/uL (ref 1.7–7.7)
Neutrophils Relative %: 64 %

## 2023-10-11 LAB — CBC
HCT: 33.9 % — ABNORMAL LOW (ref 36.0–46.0)
Hemoglobin: 10.6 g/dL — ABNORMAL LOW (ref 12.0–15.0)
MCH: 27.5 pg (ref 26.0–34.0)
MCHC: 31.3 g/dL (ref 30.0–36.0)
MCV: 88.1 fL (ref 80.0–100.0)
Platelets: 246 10*3/uL (ref 150–400)
RBC: 3.85 MIL/uL — ABNORMAL LOW (ref 3.87–5.11)
RDW: 16.2 % — ABNORMAL HIGH (ref 11.5–15.5)
WBC: 7.7 10*3/uL (ref 4.0–10.5)
nRBC: 0 % (ref 0.0–0.2)

## 2023-10-11 LAB — APTT: aPTT: 28 s (ref 24–36)

## 2023-10-11 LAB — PROTIME-INR
INR: 1 (ref 0.8–1.2)
Prothrombin Time: 13.5 s (ref 11.4–15.2)

## 2023-10-11 LAB — ETHANOL: Alcohol, Ethyl (B): <10 mg/dL (ref <10)

## 2023-10-11 NOTE — ED Triage Notes (Addendum)
Patient c/o left arm and leg numbness/weakness with pain that started last night. LKW 11:30pm  History of stroke and reports left with weakness on left side

## 2023-10-11 NOTE — ED Provider Triage Note (Signed)
Emergency Medicine Provider Triage Evaluation Note  Deanna [name] , a 73 y.o. female  was evaluated in triage.  Pt complains of weakness in the left arm and left leg, symptoms started last night, history of 3 strokes.  Review of Systems  Positive:  Negative:   Physical Exam  BP (!) 119/50 (BP Location: Left Arm)   Pulse 75   Temp 97.7 F (36.5 C) (Oral)   Resp 18   Ht 6' 1.5" (1.867 m)   Wt 72.1 kg   SpO2 100%   BMI 20.69 kg/m  Gen:   Awake, no distress   Resp:  Normal effort  MSK:   Moves extremities without difficulty  Other:    Medical Decision Making  Medically screening exam initiated at 2:43 PM.  Appropriate orders placed.  Deanna [name] was informed that the remainder of the evaluation will be completed by another provider, this initial triage assessment does not replace that evaluation, and the importance of remaining in the ED until their evaluation is complete.     Faythe Ghee, PA-C 10/11/23 1443

## 2023-10-11 NOTE — ED Notes (Signed)
Pt was offered 2 packs of graham crackers and a cup of ice water. Pt refused stating "this is not enough for me I have missed two meals" This tech responded that this was what the first RN told me to offer her. Pt states "I don't care what the nurse says. Take this away this is not enough for me" this tech clarified with pt that she wanted me to not leave the crackers or water with her, pt states "yes take it I don't want it that's not enough for me"

## 2023-10-11 NOTE — ED Triage Notes (Signed)
First Nurse Note: Patient to ED via [facility] from [facility] for left arm/ leg pain. Given tylenol at the facility with no relief. Hx of stroke with deficients on left side.

## 2023-10-12 ENCOUNTER — Emergency Department
Admission: EM | Admit: 2023-10-12 | Discharge: 2023-10-12 | Disposition: A | Discharge status: Home / Self Care | Payer: 59 | Attending: Emergency Medicine | Admitting: Emergency Medicine

## 2023-10-12 DIAGNOSIS — M79602 Pain in left arm: Secondary | ICD-10-CM

## 2023-10-12 DIAGNOSIS — R200 Anesthesia of skin: Secondary | ICD-10-CM

## 2023-10-12 NOTE — ED Provider Notes (Signed)
[facility] Provider Note    Event Date/Time   First MD Initiated Contact with Patient 10/12/23 0117     (approximate)   History   Numbness and Weakness   HPI Deanna [name] is a 73 y.o. female with prior history of CVA with left-sided deficits, HTN, DM2, ESRD on dialysis presenting today for numbness.  Patient states 2 days ago she felt like she had worsening paresthesias and numbness of her left arm and left leg.  She does have baseline paresthesias on this side but felt like it was different.  No change in weakness.  Also reports having pain to both the left arm and left leg but that has been going on for several months.  Denies any other slurred speech, other numbness or weakness, vision changes.  Reviewed chart notes with prior CVA.     Physical Exam   Triage Vital Signs: ED Triage Vitals  Encounter Vitals Group     BP 10/11/23 1432 (!) 119/50     Systolic BP Percentile --      Diastolic BP Percentile --      Pulse Rate 10/11/23 1432 75     Resp 10/11/23 1432 18     Temp 10/11/23 1432 97.7 F (36.5 C)     Temp Source 10/11/23 1432 Oral     SpO2 10/11/23 1432 100 %     Weight 10/11/23 1435 159 lb (72.1 kg)     Height 10/11/23 1435 6' 1.5" (1.867 m)     Head Circumference --      Peak Flow --      Pain Score 10/11/23 1435 10     Pain Loc --      Pain Education --      Exclude from Growth Chart --     Most recent vital signs: Vitals:   10/11/23 1809 10/12/23 0103  BP: (!) 136/59 (!) 112/100  Pulse: 73 74  Resp: 18 18  Temp: 97.7 F (36.5 C) 97.6 F (36.4 C)  SpO2: 100% 100%   I have reviewed the vital signs. General:  Awake, alert, no acute distress. Head:  Normocephalic, Atraumatic. EENT:  PERRL, EOMI, Oral mucosa pink and moist, Neck is supple. Cardiovascular: Regular rate, 2+ distal pulses. Respiratory:  Normal respiratory effort, symmetrical expansion, no distress.   Extremities:  Moving all four extremities through full  ROM without pain.   Neuro:  Alert and oriented.  Interacting appropriately.  Cranial nerves II through XII grossly intact.  Sensation differences with left-sided extremities being number in comparison to normal sensation on right sided extremities.  4/5 strength and left upper and left lower extremity which is her baseline. Skin:  Warm, dry, no rash.   Psych: Appropriate affect.    ED Results / Procedures / Treatments   Labs (all labs ordered are listed, but only abnormal results are displayed) Labs Reviewed  CBC - Abnormal; Notable for the following components:      Result Value   RBC 3.85 (*)    Hemoglobin 10.6 (*)    HCT 33.9 (*)    RDW 16.2 (*)    All other components within normal limits  COMPREHENSIVE METABOLIC PANEL - Abnormal; Notable for the following components:   BUN 34 (*)    Creatinine, Ser 4.86 (*)    Calcium 8.5 (*)    Alkaline Phosphatase 130 (*)    GFR, Estimated 9 (*)    All other components within normal limits  PROTIME-INR  APTT  DIFFERENTIAL  ETHANOL     EKG My EKG interpretation: Rate of 74, sinus rhythm first-degree AV block.  Normal axis, normal intervals.  No acute ST elevations or depressions   RADIOLOGY Independently interpreted CT head and MRI brain with no acute pathology   PROCEDURES:  Critical Care performed: No  Procedures   MEDICATIONS ORDERED IN ED: Medications - No data to display   IMPRESSION / MDM / ASSESSMENT AND PLAN / ED COURSE  I reviewed the triage vital signs and the nursing notes.                              Differential diagnosis includes, but is not limited to, CVA, cervical radiculopathy, recrudescence of stroke, ulnar radiculopathy  Patient's presentation is most consistent with acute complicated illness / injury requiring diagnostic workup.  Patient is a 73 year old female with prior history of CVA with known left-sided deficits presenting today for perceived worsening numbness of her left-sided extremities  over the past 2 days.  Outside stroke window.  No TNK candidate.  Patient with CT head showing concern for possible hypodensity.  Follow-up MRI brain shows no evidence of CVA.  Based on her story she appears to have more numbness symptoms in her left upper arm.  She does have prior history of cervical spine fracture which has been affecting her right upper extremity and she sees neurosurgery for.  I do have concern that this could be related to today's symptoms.  I recommended further imaging of her cervical spine for evaluation.  Patient was adamant that she did not want to stay in the emergency department anymore.  She did not wish to have any additional imaging.  I did explain to her the risks without getting additional imaging and further workup to explain her numbness symptoms including worsening symptoms.  Patient had capacity and still wished to be discharged at this time. Patient left AMA. I told her to call her neurosurgeon immediately first thing in the morning for reevaluation.  Gave her return precautions.     FINAL CLINICAL IMPRESSION(S) / ED DIAGNOSES   Final diagnoses:  Left arm numbness  Left arm pain     Rx / DC Orders   ED Discharge Orders     None        Note:  This document was prepared using Dragon voice recognition software and may include unintentional dictation errors.   Janith Lima, MD 10/12/23 [phone]

## 2023-10-12 NOTE — Discharge Instructions (Signed)
Please call your neurosurgeon first thing in the morning for outpatient evaluation.  Otherwise return for worsening symptoms.

## 2023-10-12 NOTE — ED Notes (Signed)
Ems was called for transport back to [facility]

## 2023-10-12 NOTE — ED Notes (Addendum)
Pt refused vitals. First nurse made aware .

## 2023-10-12 NOTE — ED Notes (Signed)
Pt finally allowed [facility] NT to get vitals .

## 2023-10-15 ENCOUNTER — Ambulatory Visit (INDEPENDENT_AMBULATORY_CARE_PROVIDER_SITE_OTHER): Payer: 59 | Admitting: Neurosurgery

## 2023-10-15 ENCOUNTER — Telehealth: Payer: Self-pay | Admitting: Neurosurgery

## 2023-10-15 ENCOUNTER — Encounter: Payer: Self-pay | Admitting: Neurosurgery

## 2023-10-15 VITALS — BP 140/72 | Ht 73.5 in | Wt 156.0 lb

## 2023-10-15 DIAGNOSIS — R531 Weakness: Secondary | ICD-10-CM | POA: Diagnosis not present

## 2023-10-15 DIAGNOSIS — G5621 Lesion of ulnar nerve, right upper limb: Secondary | ICD-10-CM | POA: Diagnosis not present

## 2023-10-15 DIAGNOSIS — R200 Anesthesia of skin: Secondary | ICD-10-CM | POA: Diagnosis not present

## 2023-10-15 DIAGNOSIS — S12290D Other displaced fracture of third cervical vertebra, subsequent encounter for fracture with routine healing: Secondary | ICD-10-CM | POA: Diagnosis not present

## 2023-10-15 NOTE — Telephone Encounter (Signed)
Davetta, the Unit Coordinator on [facility] at [facility] is calling to obtain clarification on holding the patient's medications prior to surgery and how long before surgery she needs to be NPO.

## 2023-10-15 NOTE — Telephone Encounter (Signed)
Called back Deanna [name] at [facility]. She asked for previous surgery date instructions to be faxed. I am sending those now along with her surgical clearance form.

## 2023-10-15 NOTE — Progress Notes (Signed)
Date of initial consult: 05/01/2023  HISTORY OF PRESENT ILLNESS: 10/15/2023 [name] is a 73 year old woman with a very complicated past medical history who is here today to follow-up.  She had a recent visit to the ER with an exacerbation of left upper extremity pain.  She states that this may or may not have been related to a fall.  In regards to her left upper extremity is mostly pain radiating into her shoulder.  She does have ulnar neuropathy on that side as well, however this current exacerbation does not fit that localization.  She does continue to have severe weakness on the right with intrinsic hand weakness and is scheduled for a ulnar nerve operation next month.  PHYSICAL EXAMINATION:   Vitals:   10/15/23 1331  BP: (!) 140/72   General: Patient is well developed, well nourished, calm, collected, and in no apparent distress.  NEUROLOGICAL:  General: In no acute distress.  Awake, alert, oriented to person, place, and time. Pupils equal round and reactive to light.   Strength: She shows evidence of ulnar clawing with a positive Wartenberg's and positive Froment's sign.  She has wasting of her first dorsal interosseous musculature.  Her grip strength is strong but her finger abduction is showing significant weakness.   ROS (Neurologic): Negative except as noted above  IMAGING/electrodiagnostics:   [facility]  [address] [place], [address]  [place], [place] [address] Tel: [phone] Fax: [phone] Test Date:  09/03/2023   Patient: Deanna [name] DOB: 16-Feb-1951 Physician: Jacquelyne Balint, MD  Sex: Female Height: 6\' 1"  Ref Phys: Ernestine Mcmurray, MD  ID#: [id number]     Technician:      History: This is a 73 year old female with numbness and pain in neck and arms.   NCV & EMG Findings: Extensive electrodiagnostic evaluation of bilateral upper limbs shows: Bilateral median and ulnar sensory responses are absent. Bilateral radial sensory responses  show reduced amplitude (L9, R9 V). Bilateral ulnar (ADM) motor responses show reduced amplitude (L5.4, R3.0 mV). Right ulnar motor response also shows decreased conduction velocity from above elbow to below elbow stimulation site (33 m/s). Chronic motor axon loss changes without accompanying active denervation changes are seen in bilateral first dorsal interosseous, bilateral extensor indicis proprius, right abductor digiti minimi, and bilateral cervical (C7 level) paraspinal muscles.   Impression: This is a complex study. 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       ___________________________ Jacquelyne Balint, MD    ASSESSMENT/PLAN:  Deanna [name] is doing well with her conservative management for her C3 anterior osteophyte fracture.  She has been evaluated and found to have a severe ulnar neuropathy bilaterally.  She also has a chronic right-sided C8 lesion.  We are planning on addressing her right sided upper extremity weakness and ulnar neuropathy next month.  At the current time her left upper extremity pain exacerbation does not clearly localize to the ulnar nerve.  Could be a direct result of her fall and stretching of any nerves nearby.  Will plan to move forward with  a right upper extremity intervention as planned.  Deanna Kim, MD/MSCR Department of Neurosurgery

## 2023-10-16 NOTE — Progress Notes (Signed)
Carelink Summary Report / Loop Recorder 

## 2023-10-17 ENCOUNTER — Encounter: Payer: Medicare HMO | Admitting: Physician Assistant

## 2023-10-19 ENCOUNTER — Ambulatory Visit (INDEPENDENT_AMBULATORY_CARE_PROVIDER_SITE_OTHER): Payer: Medicare HMO

## 2023-10-19 DIAGNOSIS — I70219 Atherosclerosis of native arteries of extremities with intermittent claudication, unspecified extremity: Secondary | ICD-10-CM

## 2023-10-20 LAB — CUP PACEART REMOTE DEVICE CHECK
Date Time Interrogation Session: 20250124171807
Implantable Pulse Generator Implant Date: 20241113

## 2023-11-02 ENCOUNTER — Inpatient Hospital Stay
Admission: RE | Admit: 2023-11-02 | Discharge: 2023-11-02 | Disposition: A | Discharge status: Home / Self Care | Payer: Medicare HMO | Source: Ambulatory Visit

## 2023-11-02 NOTE — Pre-Procedure Instructions (Signed)
 Patient resides at [facility]. Spoke to [employer] at [facility]. She is to fax over pt's MAR and FL2 form. After review will send instructions. Requested someone pick up SAGE wipes agreement made to pick up at our office Monday 11/05/23. Will provide instructions for wipes along with the usual pre op instructions.  2nd call for [facility] and facesheet. Spoke to pt's nurse gave her fax number stated she would fax required info asap.  3rd call reception transferred to director. Left msg.

## 2023-11-02 NOTE — Patient Instructions (Addendum)
 Your procedure is scheduled on:  11/08/23 Thursday Report to the Registration Desk on the 1st floor of the [facility]. To find out your arrival time, please call [phone] between 1PM - 3PM on:  11/07/23 Wednesday. If your arrival time is 6:00 am, do not arrive before that time as the [facility] entrance doors do not open until 6:00 am.   REMEMBER: Instructions that are not followed completely may result in serious medical risk, up to and including death; or upon the discretion of your surgeon and anesthesiologist your surgery may need to be rescheduled.   Do not eat food after midnight the night before surgery.  No gum chewing or hard candies.   You may however, drink CLEAR liquids up to 2 hours before you are scheduled to arrive for your surgery. Do not drink anything within 2 hours of your scheduled arrival time.   Clear liquids include: - water    One week prior to surgery: Stop Anti-inflammatories (NSAIDS) such as Advil, Aleve, Ibuprofen, Motrin, Naproxen, Naprosyn and Aspirin  based products such as Excedrin, Goody's Powder, BC Powder. Stop ANY OVER THE COUNTER supplements until after surgery like, ferrous sulfate , Vit D,   You may however, continue to take Tylenol  if needed for pain up until the day of surgery.   **Follow recommendations regarding stopping blood thinners.**   Aspirin : ok to stay on aspirin  81 mg. Continue  as prescribed.   Plavix :     Stop Plavix  7 days prior. Last dose will be 10/31/23 Wednesday.  Resume 11/15/23.   Continue taking all of your other prescription medications up until the day of surgery.   ON THE DAY OF SURGERY ONLY TAKE THESE MEDICATIONS WITH SIPS OF WATER:   atorvastatin  (LIPITOR) metoprolol  tartrate (LOPRESSOR )  oxyCODONE -acetaminophen  (PERCOCET) gabapentin  (NEURONTIN    No Alcohol for 24 hours before or after surgery.   No Smoking including e-cigarettes for 24 hours before surgery.  No chewable tobacco products for at least 6 hours  before surgery.  No nicotine patches on the day of surgery.   Do not use any recreational drugs for at least a week (preferably 2 weeks) before your surgery.  Please be advised that the combination of cocaine and anesthesia may have negative outcomes, up to and including death. If you test positive for cocaine, your surgery will be cancelled.   On the morning of surgery brush your teeth with toothpaste and water, you may rinse your mouth with mouthwash if you wish. Do not swallow any toothpaste or mouthwash.   Use CHG Soap or wipes as directed on instruction sheet.- wipes are provided for you    Do not wear jewelry, make-up, hairpins, clips or nail polish.   For welded (permanent) jewelry: bracelets, anklets, waist bands, etc.  Please have this removed prior to surgery.  If it is not removed, there is a chance that hospital personnel will need to cut it off on the day of surgery.   Do not wear lotions, powders, or perfumes.    Do not shave body hair from the neck down 48 hours before surgery.   Contact lenses, hearing aids and dentures may not be worn into surgery.   Do not bring valuables to the hospital. [facility] is not responsible for any missing/lost belongings or valuables.      Notify your doctor if there is any change in your medical condition (cold, fever, infection).   Wear comfortable clothing (specific to your surgery type) to the hospital.  After surgery, you can help prevent lung complications by doing breathing exercises.  Take deep breaths and cough every 1-2 hours. Your doctor may order a device called an Incentive Spirometer to help you take deep breaths.   If you are being admitted to the hospital overnight, leave your suitcase in the car. After surgery it may be brought to your room.   In case of increased patient census, it may be necessary for you, the patient, to continue your postoperative care in the Same Day Surgery department.   If you are being  discharged the day of surgery, you will not be allowed to drive home. You will need a responsible individual to drive you home and stay with you for 24 hours after surgery.    If you are taking public transportation, you will need to have a responsible individual with you.   Please call the Pre-admissions Testing Dept. at [phone] if you have any questions about these instructions.   Surgery Visitation Policy:   Patients having surgery or a procedure may have two visitors.  Children under the age of 67 must have an adult with them who is not the patient.       Preparing the Skin Before Surgery        To help prevent the risk of infection at your surgical site, we are now providing you with rinse-free Sage 2% Chlorhexidine  Gluconate (CHG) disposable wipes.   Chlorhexidine  Gluconate (CHG) Soap   o An antiseptic cleaner that kills germs and bonds with the skin to continue killing germs even after washing   o Used for showering the night before surgery and morning of surgery   The night before surgery: Shower or bathe with warm water. Do not apply perfume, lotions, powders. Wait one hour after shower. Skin should be dry and cool. Open Sage wipe package - use 6 disposable cloths. Wipe body using one cloth for the right arm, one cloth for the left arm, one cloth for the right leg, one cloth for the left leg, one cloth for the chest/abdomen area, and one cloth for the back. Do not use on open wounds or sores. Do not use on face or genitals (private parts). If you are breast feeding, do not use on breasts. 5. Do not rinse, allow to dry. 6. Skin may feel tacky for several minutes. 7. Dress in clean clothes. 8. Place clean sheets on your bed and do not sleep with pets.   REPEAT ABOVE ON THE MORNING OF SURGERY BEFORE ARRIVING TO THE HOSPITAL.

## 2023-11-05 ENCOUNTER — Encounter
Admission: RE | Admit: 2023-11-05 | Discharge: 2023-11-05 | Disposition: A | Discharge status: Home / Self Care | Payer: 59 | Source: Ambulatory Visit | Attending: Neurosurgery | Admitting: Neurosurgery

## 2023-11-05 NOTE — Patient Instructions (Addendum)
 Your procedure is scheduled on: Thursday 11/08/23 To find out your arrival time, please call [phone] between 1PM - 3PM on:   Wednesday 11/07/23 Report to the Registration Desk on the 1st floor of the [facility]. Free Valet parking is available.  If your arrival time is 6:00 am, do not arrive before that time as the [facility] entrance doors do not open until 6:00 am.  REMEMBER: Instructions that are not followed completely may result in serious medical risk, up to and including death; or upon the discretion of your surgeon and anesthesiologist your surgery may need to be rescheduled.  Do not eat food or drink any liquids after midnight the night before surgery.  No gum chewing or hard candies.  One week prior to surgery: Stop Anti-inflammatories (NSAIDS) such as Advil, Aleve, Ibuprofen, Motrin, Naproxen, Naprosyn and Aspirin  based products such as Excedrin, Goody's Powder, BC Powder. You may however, continue to take Tylenol  if needed for pain up until the day of surgery.  Stop ANY OVER THE COUNTER supplements until after surgery.  Continue taking all prescribed medications with the exception of the following: Plavix  was scheduled to be held starting Thursday 11/01/23. Last dose Wednesday 10/31/23. Patient is ok to continue daily aspirin  except for the day of surgery.  TAKE ONLY THESE MEDICATIONS THE MORNING OF SURGERY WITH A SIP OF WATER:  Metoprolol   No Alcohol for 24 hours before or after surgery.  No Smoking including e-cigarettes for 24 hours before surgery.  No chewable tobacco products for at least 6 hours before surgery.  No nicotine patches on the day of surgery.  Do not use any "recreational" drugs for at least a week (preferably 2 weeks) before your surgery.  Please be advised that the combination of cocaine and anesthesia may have negative outcomes, up to and including death. If you test positive for cocaine, your surgery will be cancelled.  On the morning of  surgery brush your teeth with toothpaste and water, you may rinse your mouth with mouthwash if you wish. Do not swallow any toothpaste or mouthwash.  Use CHG Soap or wipes as directed on instruction sheet. To be picked up at our office at [address] [facility]  Do not wear lotions, powders, or perfumes.   Do not shave body hair from the neck down 48 hours before surgery.  Wear clean comfortable clothing (specific to your surgery type) to the hospital.  Do not wear jewelry, make-up, hairpins, clips or nail polish.  For welded (permanent) jewelry: bracelets, anklets, waist bands, etc.  Please have this removed prior to surgery.  If it is not removed, there is a chance that hospital personnel will need to cut it off on the day of surgery. Contact lenses, hearing aids and dentures may not be worn into surgery.  Do not bring valuables to the hospital. [facility] is not responsible for any missing/lost belongings or valuables.   Notify your doctor if there is any change in your medical condition (cold, fever, infection).  If you are being discharged the day of surgery, you will not be allowed to drive home. You will need a responsible individual to drive you home and stay with you for 24 hours after surgery.   If you are taking public transportation, you will need to have a responsible individual with you.  If you are being admitted to the hospital overnight, leave your suitcase in the car. After surgery it may be brought to your  room.  In case of increased patient census, it may be necessary for you, the patient, to continue your postoperative care in the Same Day Surgery department.  After surgery, you can help prevent lung complications by doing breathing exercises.  Take deep breaths and cough every 1-2 hours. Your doctor may order a device called an Incentive Spirometer to help you take deep breaths. When coughing or sneezing, hold a pillow firmly against your  incision with both hands. This is called "splinting." Doing this helps protect your incision. It also decreases belly discomfort.  Surgery Visitation Policy:  Patients undergoing a surgery or procedure may have two family members or support persons with them as long as the person is not COVID-19 positive or experiencing its symptoms.   Inpatient Visitation:    Visiting hours are 7 a.m. to 8 p.m. Up to four visitors are allowed at one time in a patient room. The visitors may rotate out with other people during the day. One designated support person (adult) may remain overnight.  Due to an increase in RSV and influenza rates and associated hospitalizations, children ages 94 and under will not be able to visit patients in [facility]. Masks continue to be strongly recommended.  Please call the Pre-admissions Testing Dept. at [phone] if you have any questions about these instructions.  Preparing the Skin Before Surgery   Per conversation Friday 11/02/23 Wauneta Haddock from [facility]  a staff member was to pick these up on Monday 11/05/23.  To help prevent the risk of infection at your surgical site, we are now providing you with rinse-free Sage 2% Chlorhexidine  Gluconate (CHG) disposable wipes.  Chlorhexidine  Gluconate (CHG) Soap  o An antiseptic cleaner that kills germs and bonds with the skin to continue killing germs even after washing  o Used for showering the night before surgery and morning of surgery  The night before surgery: Shower or bathe with warm water. Do not apply perfume, lotions, powders. Wait one hour after shower. Skin should be dry and cool. Open Sage wipe package - use 6 disposable cloths. Wipe body using one cloth for the right arm, one cloth for the left arm, one cloth for the right leg, one cloth for the left leg, one cloth for the chest/abdomen area, and one cloth for the back. Do not use on open wounds or sores. Do not use on face or genitals  (private parts). If you are breast feeding, do not use on breasts. 5. Do not rinse, allow to dry. 6. Skin may feel "tacky" for several minutes. 7. Dress in clean clothes. 8. Place clean sheets on your bed and do not sleep with pets.  REPEAT ABOVE ON THE MORNING OF SURGERY BEFORE ARRIVING TO THE HOSPITAL.

## 2023-11-05 NOTE — Pre-Procedure Instructions (Signed)
 Chart reviewed and medications updated as indicated. Instructions faxed to [facility].

## 2023-11-08 ENCOUNTER — Other Ambulatory Visit: Payer: Self-pay

## 2023-11-08 ENCOUNTER — Ambulatory Visit: Payer: 59 | Admitting: Registered Nurse

## 2023-11-08 ENCOUNTER — Encounter
Admission: RE | Disposition: A | Discharge status: Home / Self Care | Payer: Self-pay | Source: Home / Self Care | Attending: Neurosurgery

## 2023-11-08 ENCOUNTER — Ambulatory Visit: Payer: 59 | Admitting: Urgent Care

## 2023-11-08 ENCOUNTER — Ambulatory Visit
Admission: RE | Admit: 2023-11-08 | Discharge: 2023-11-08 | Disposition: A | Discharge status: Home / Self Care | Payer: 59 | Attending: Neurosurgery | Admitting: Neurosurgery

## 2023-11-08 ENCOUNTER — Encounter: Payer: Self-pay | Admitting: Neurosurgery

## 2023-11-08 DIAGNOSIS — R531 Weakness: Secondary | ICD-10-CM

## 2023-11-08 DIAGNOSIS — Z992 Dependence on renal dialysis: Secondary | ICD-10-CM | POA: Diagnosis not present

## 2023-11-08 DIAGNOSIS — Z7902 Long term (current) use of antithrombotics/antiplatelets: Secondary | ICD-10-CM | POA: Diagnosis not present

## 2023-11-08 DIAGNOSIS — I69951 Hemiplegia and hemiparesis following unspecified cerebrovascular disease affecting right dominant side: Secondary | ICD-10-CM | POA: Insufficient documentation

## 2023-11-08 DIAGNOSIS — E1142 Type 2 diabetes mellitus with diabetic polyneuropathy: Secondary | ICD-10-CM | POA: Insufficient documentation

## 2023-11-08 DIAGNOSIS — I509 Heart failure, unspecified: Secondary | ICD-10-CM | POA: Insufficient documentation

## 2023-11-08 DIAGNOSIS — N186 End stage renal disease: Secondary | ICD-10-CM | POA: Diagnosis not present

## 2023-11-08 DIAGNOSIS — I132 Hypertensive heart and chronic kidney disease with heart failure and with stage 5 chronic kidney disease, or end stage renal disease: Secondary | ICD-10-CM | POA: Insufficient documentation

## 2023-11-08 DIAGNOSIS — G5623 Lesion of ulnar nerve, bilateral upper limbs: Secondary | ICD-10-CM | POA: Diagnosis present

## 2023-11-08 DIAGNOSIS — G5621 Lesion of ulnar nerve, right upper limb: Secondary | ICD-10-CM | POA: Diagnosis present

## 2023-11-08 DIAGNOSIS — D631 Anemia in chronic kidney disease: Secondary | ICD-10-CM

## 2023-11-08 DIAGNOSIS — E1122 Type 2 diabetes mellitus with diabetic chronic kidney disease: Secondary | ICD-10-CM | POA: Insufficient documentation

## 2023-11-08 DIAGNOSIS — Z01812 Encounter for preprocedural laboratory examination: Secondary | ICD-10-CM

## 2023-11-08 DIAGNOSIS — Z01818 Encounter for other preprocedural examination: Secondary | ICD-10-CM

## 2023-11-08 HISTORY — DX: Lesion of ulnar nerve, right upper limb: G56.21

## 2023-11-08 HISTORY — PX: NERVE TRANSFER: SHX2084

## 2023-11-08 HISTORY — PX: ULNAR NERVE DECOMPRESSION: SHX7404

## 2023-11-08 LAB — POCT I-STAT, CHEM 8
BUN: 34 mg/dL — ABNORMAL HIGH (ref 8–23)
Calcium, Ion: 1 mmol/L — ABNORMAL LOW (ref 1.15–1.40)
Chloride: 98 mmol/L (ref 98–111)
Creatinine, Ser: 4.8 mg/dL — ABNORMAL HIGH (ref 0.44–1.00)
Glucose, Bld: 112 mg/dL — ABNORMAL HIGH (ref 70–99)
HCT: 41 % (ref 36.0–46.0)
Hemoglobin: 13.9 g/dL (ref 12.0–15.0)
Potassium: 3.9 mmol/L (ref 3.5–5.1)
Sodium: 138 mmol/L (ref 135–145)
TCO2: 29 mmol/L (ref 22–32)

## 2023-11-08 LAB — GLUCOSE, CAPILLARY: Glucose-Capillary: 83 mg/dL (ref 70–99)

## 2023-11-08 SURGERY — ULNAR NERVE DECOMPRESSION
Anesthesia: General | Site: Arm Upper | Laterality: Right

## 2023-11-08 MED ORDER — OXYCODONE HCL 5 MG PO TABS
ORAL_TABLET | ORAL | Status: AC
  Filled 2023-11-08: qty 1

## 2023-11-08 MED ORDER — BUPIVACAINE-EPINEPHRINE 0.5% -1:200000 IJ SOLN
INTRAMUSCULAR | Status: DC | PRN
  Administered 2023-11-08: 10 mL

## 2023-11-08 MED ORDER — DEXAMETHASONE SODIUM PHOSPHATE 10 MG/ML IJ SOLN
INTRAMUSCULAR | Status: DC | PRN
Start: 2023-11-08 — End: 2023-11-08
  Administered 2023-11-08: 5 mg via INTRAVENOUS

## 2023-11-08 MED ORDER — PHENYLEPHRINE 80 MCG/ML (10ML) SYRINGE FOR IV PUSH (FOR BLOOD PRESSURE SUPPORT)
PREFILLED_SYRINGE | INTRAVENOUS | Status: AC
  Filled 2023-11-08: qty 10

## 2023-11-08 MED ORDER — FENTANYL CITRATE (PF) 100 MCG/2ML IJ SOLN
INTRAMUSCULAR | Status: DC | PRN
  Administered 2023-11-08 (×3): 25 ug via INTRAVENOUS
  Administered 2023-11-08: 50 ug via INTRAVENOUS

## 2023-11-08 MED ORDER — BUPIVACAINE-EPINEPHRINE (PF) 0.5% -1:200000 IJ SOLN
INTRAMUSCULAR | Status: AC
  Filled 2023-11-08: qty 10

## 2023-11-08 MED ORDER — ONDANSETRON HCL 4 MG/2ML IJ SOLN: 4.0000 mg | Freq: Once | INTRAMUSCULAR | Status: DC | PRN

## 2023-11-08 MED ORDER — FENTANYL CITRATE (PF) 100 MCG/2ML IJ SOLN: 25.0000 ug | INTRAMUSCULAR | Status: DC | PRN

## 2023-11-08 MED ORDER — FENTANYL CITRATE (PF) 100 MCG/2ML IJ SOLN
INTRAMUSCULAR | Status: AC
  Filled 2023-11-08: qty 2

## 2023-11-08 MED ORDER — CHLORHEXIDINE GLUCONATE 0.12 % MT SOLN
15.0000 mL | Freq: Once | OROMUCOSAL | Status: AC
  Administered 2023-11-08: 15 mL via OROMUCOSAL

## 2023-11-08 MED ORDER — OXYCODONE HCL 5 MG PO TABS
5.0000 mg | ORAL_TABLET | Freq: Once | ORAL | Status: AC | PRN
  Administered 2023-11-08: 5 mg via ORAL

## 2023-11-08 MED ORDER — PHENYLEPHRINE 80 MCG/ML (10ML) SYRINGE FOR IV PUSH (FOR BLOOD PRESSURE SUPPORT)
PREFILLED_SYRINGE | INTRAVENOUS | Status: DC | PRN
  Administered 2023-11-08: 160 ug via INTRAVENOUS

## 2023-11-08 MED ORDER — PHENYLEPHRINE HCL-NACL 20-0.9 MG/250ML-% IV SOLN
INTRAVENOUS | Status: DC | PRN
Start: 2023-11-08 — End: 2023-11-08
  Administered 2023-11-08: 20 ug/min via INTRAVENOUS

## 2023-11-08 MED ORDER — SURGIFLO WITH THROMBIN (HEMOSTATIC MATRIX KIT) OPTIME
TOPICAL | Status: DC | PRN
  Administered 2023-11-08: 1 via TOPICAL

## 2023-11-08 MED ORDER — 0.9 % SODIUM CHLORIDE (POUR BTL) OPTIME
TOPICAL | Status: DC | PRN
  Administered 2023-11-08: 500 mL

## 2023-11-08 MED ORDER — CEFAZOLIN IN SODIUM CHLORIDE 2-0.9 GM/100ML-% IV SOLN
2.0000 g | Freq: Once | INTRAVENOUS | Status: AC
  Administered 2023-11-08: 2 g via INTRAVENOUS
  Filled 2023-11-08: qty 100

## 2023-11-08 MED ORDER — ONDANSETRON HCL 4 MG/2ML IJ SOLN
INTRAMUSCULAR | Status: DC | PRN
  Administered 2023-11-08: 4 mg via INTRAVENOUS

## 2023-11-08 MED ORDER — ACETAMINOPHEN 10 MG/ML IV SOLN: 1000.0000 mg | Freq: Once | INTRAVENOUS | Status: DC | PRN

## 2023-11-08 MED ORDER — PROPOFOL 500 MG/50ML IV EMUL
INTRAVENOUS | Status: DC | PRN
  Administered 2023-11-08: 30 mg via INTRAVENOUS
  Administered 2023-11-08: 75 ug/kg/min via INTRAVENOUS
  Administered 2023-11-08 (×4): 30 mg via INTRAVENOUS
  Administered 2023-11-08: 40 mg via INTRAVENOUS

## 2023-11-08 MED ORDER — ACETAMINOPHEN 10 MG/ML IV SOLN
INTRAVENOUS | Status: AC
  Filled 2023-11-08: qty 100

## 2023-11-08 MED ORDER — CHLORHEXIDINE GLUCONATE 0.12 % MT SOLN
OROMUCOSAL | Status: AC
  Filled 2023-11-08: qty 15

## 2023-11-08 MED ORDER — PROPOFOL 10 MG/ML IV BOLUS
INTRAVENOUS | Status: AC
  Filled 2023-11-08: qty 20

## 2023-11-08 MED ORDER — LIDOCAINE HCL (PF) 2 % IJ SOLN
INTRAMUSCULAR | Status: AC
  Filled 2023-11-08: qty 5

## 2023-11-08 MED ORDER — SODIUM CHLORIDE 0.9 % IV SOLN: INTRAVENOUS | Status: DC

## 2023-11-08 MED ORDER — OXYCODONE HCL 5 MG/5ML PO SOLN: 5.0000 mg | Freq: Once | ORAL | Status: AC | PRN

## 2023-11-08 MED ORDER — PROPOFOL 1000 MG/100ML IV EMUL
INTRAVENOUS | Status: AC
Start: 2023-11-08 — End: ?
  Filled 2023-11-08: qty 100

## 2023-11-08 MED ORDER — ACETAMINOPHEN 10 MG/ML IV SOLN
INTRAVENOUS | Status: DC | PRN
  Administered 2023-11-08: 1000 mg via INTRAVENOUS

## 2023-11-08 MED ORDER — LACTATED RINGERS IV SOLN: INTRAVENOUS | Status: AC

## 2023-11-08 MED ORDER — LIDOCAINE HCL (CARDIAC) PF 100 MG/5ML IV SOSY
PREFILLED_SYRINGE | INTRAVENOUS | Status: DC | PRN
  Administered 2023-11-08: 50 mg via INTRAVENOUS

## 2023-11-08 MED ORDER — CEFAZOLIN SODIUM-DEXTROSE 2-4 GM/100ML-% IV SOLN
INTRAVENOUS | Status: AC
  Filled 2023-11-08: qty 100

## 2023-11-08 MED ORDER — TISSEEL 10 ML EX KIT
PACK | CUTANEOUS | Status: DC | PRN
  Administered 2023-11-08: 1

## 2023-11-08 MED ORDER — DEXAMETHASONE SODIUM PHOSPHATE 10 MG/ML IJ SOLN
INTRAMUSCULAR | Status: AC
  Filled 2023-11-08: qty 1

## 2023-11-08 MED ORDER — ORAL CARE MOUTH RINSE: 15.0000 mL | Freq: Once | OROMUCOSAL | Status: AC

## 2023-11-08 SURGICAL SUPPLY — 35 items
BNDG COHESIVE 4X5 TAN STRL LF (GAUZE/BANDAGES/DRESSINGS) ×1 IMPLANT
BNDG GAUZE DERMACEA FLUFF 4 (GAUZE/BANDAGES/DRESSINGS) ×1 IMPLANT
BRUSH SCRUB EZ 4% CHG (MISCELLANEOUS) ×1 IMPLANT
CHLORAPREP W/TINT 26 (MISCELLANEOUS) ×1 IMPLANT
DERMABOND ADVANCED .7 DNX12 (GAUZE/BANDAGES/DRESSINGS) ×1 IMPLANT
DRAPE SURG 17X11 SM STRL (DRAPES) ×2 IMPLANT
FORCEPS JEWEL BIP 4-3/4 STR (INSTRUMENTS) ×1 IMPLANT
GAUZE SPONGE 4X4 12PLY STRL (GAUZE/BANDAGES/DRESSINGS) ×1 IMPLANT
GLOVE BIOGEL PI IND STRL 8 (GLOVE) ×2 IMPLANT
GLOVE SURG SYN 7.5 E (GLOVE) ×1 IMPLANT
GLOVE SURG SYN 7.5 PF PI (GLOVE) ×1 IMPLANT
GOWN STRL REUS W/ TWL LRG LVL3 (GOWN DISPOSABLE) ×1 IMPLANT
GOWN STRL REUS W/ TWL XL LVL3 (GOWN DISPOSABLE) ×1 IMPLANT
KIT TURNOVER KIT A (KITS) ×1 IMPLANT
LOOP VESSEL MAXI 1X406 RED (MISCELLANEOUS) IMPLANT
LOOP VESSEL MINI 0.8X406 BLUE (MISCELLANEOUS) IMPLANT
MANIFOLD NEPTUNE II (INSTRUMENTS) ×1 IMPLANT
NDL HYPO 25X1 1.5 SAFETY (NEEDLE) ×1 IMPLANT
NEEDLE HYPO 25X1 1.5 SAFETY (NEEDLE) ×1 IMPLANT
NS IRRIG 500ML POUR BTL (IV SOLUTION) ×1 IMPLANT
PACK BASIN MINOR ARMC (MISCELLANEOUS) ×1 IMPLANT
PACK EXTREMITY ARMC (MISCELLANEOUS) ×1 IMPLANT
PROBE NEUROSIGN BIPOL (MISCELLANEOUS) IMPLANT
SEALANT FIBRIN TSSL HEMOSTAT 2 (Miscellaneous) IMPLANT
SPONGE KITTNER 5P (MISCELLANEOUS) ×1 IMPLANT
STOCKINETTE IMPERVIOUS 9X36 MD (GAUZE/BANDAGES/DRESSINGS) ×1 IMPLANT
SURGIFLO W/THROMBIN 8M KIT (HEMOSTASIS) IMPLANT
SUT ETHILON 6 0 9-3 1X18 BLK (SUTURE) IMPLANT
SUT ETHILON 8 0 (SUTURE) ×1 IMPLANT
SUT STRATA 3-0 15 PS-2 (SUTURE) IMPLANT
SUT STRATA 3-0 15 RB-1.5 (SUTURE) IMPLANT
SUT VIC AB 2-0 SH 27XBRD (SUTURE) IMPLANT
SUT VIC AB 3-0 SH 27X BRD (SUTURE) IMPLANT
TRAP FLUID SMOKE EVACUATOR (MISCELLANEOUS) ×1 IMPLANT
WATER STERILE IRR 500ML POUR (IV SOLUTION) ×1 IMPLANT

## 2023-11-08 NOTE — Progress Notes (Signed)
Patient has 2+ radial pulse in surgical extremity, can wiggle fingers, moderate strength grip. A&O x4

## 2023-11-08 NOTE — Anesthesia Postprocedure Evaluation (Signed)
Anesthesia Post Note  Patient: Science writer  Procedure(s) Performed: RIGHT ULNAR NERVE DECOMPRESSION (Right: Arm Upper) ANTERIOR INTEROSSEOUS NERVE TO ULNAR NERVE TRANSFER, END TO SIDE (Right: Arm Upper)  Patient location during evaluation: PACU Anesthesia Type: General Level of consciousness: awake and alert, oriented and patient cooperative Pain management: pain level controlled Vital Signs Assessment: post-procedure vital signs reviewed and stable Respiratory status: spontaneous breathing, nonlabored ventilation and respiratory function stable Cardiovascular status: blood pressure returned to baseline and stable Postop Assessment: adequate PO intake Anesthetic complications: no   No notable events documented.   Last Vitals:  Vitals:   11/08/23 0945 11/08/23 1000  BP: (!) 123/51 (!) 132/48  Pulse: 91 89  Resp: 15 16  Temp:  36.5 C  SpO2: 98% 100%    Last Pain:  Vitals:   11/08/23 1000  TempSrc: Temporal  PainSc:                  Reed Breech

## 2023-11-08 NOTE — Anesthesia Preprocedure Evaluation (Addendum)
Anesthesia Evaluation  Patient identified by MRN, date of birth, ID band Patient awake    Reviewed: Allergy & Precautions, NPO status , Patient's Chart, lab work & pertinent test results  History of Anesthesia Complications Negative for: history of anesthetic complications  Airway Mallampati: IV   Neck ROM: Full    Dental  (+) Implants   Pulmonary neg pulmonary ROS   Pulmonary exam normal breath sounds clear to auscultation       Cardiovascular hypertension, + CAD and +CHF  Normal cardiovascular exam Rhythm:Regular Rate:Normal  ECG 10/11/23: SR with 1st degree AVB  Echo 07/27/21:  MODERATE LV SYSTOLIC DYSFUNCTION  NORMAL RIGHT VENTRICULAR SYSTOLIC FUNCTION  NO VALVULAR STENOSIS  MILD MR, PR  TRIVIAL TR  EF 30%   Myocardial perfusion 07/27/21:  Normal Lexiscan infusion EKG  Moderate to severe LV systolic dysfunction  Moderate dilation of the left ventricle  Normal myocardial perfusion without evidence of myocardial ischemia     Neuro/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; residual right-sided weakness)    GI/Hepatic ,GERD  ,,  Endo/Other  diabetes, Type 2    Renal/GU ESRF and DialysisRenal disease     Musculoskeletal   Abdominal   Peds  Hematology  (+) Blood dyscrasia, anemia   Anesthesia Other Findings   Reproductive/Obstetrics                             Anesthesia Physical Anesthesia Plan  ASA: 3  Anesthesia Plan: General   Post-op Pain Management:    Induction: Intravenous  PONV Risk Score and Plan: 3 and Propofol infusion, TIVA, Treatment may vary due to age or medical condition and Ondansetron  Airway Management Planned: Natural Airway  Additional Equipment:   Intra-op Plan:   Post-operative Plan:   Informed Consent: I have reviewed the patients History and Physical, chart, labs and discussed the procedure including the risks, benefits and alternatives for the proposed  anesthesia with the patient or authorized representative who has indicated his/her understanding and acceptance.       Plan Discussed with: CRNA  Anesthesia Plan Comments: (LMA/GETA backup discussed.  Patient consented for risks of anesthesia including but not limited to:  - adverse reactions to medications - damage to eyes, teeth, lips or other oral mucosa - nerve damage due to positioning  - sore throat or hoarseness - damage to heart, brain, nerves, lungs, other parts of body or loss of life  Informed patient about role of CRNA in peri- and intra-operative care.  Patient voiced understanding.)        Anesthesia Quick Evaluation

## 2023-11-08 NOTE — Transfer of Care (Signed)
Immediate Anesthesia Transfer of Care Note  Patient: Deanna [name]  Procedure(s) Performed: RIGHT ULNAR NERVE DECOMPRESSION (Right: Arm Upper) ANTERIOR INTEROSSEOUS NERVE TO ULNAR NERVE TRANSFER, END TO SIDE (Right: Arm Upper)  Patient Location: PACU  Anesthesia Type:General  Level of Consciousness: drowsy and patient cooperative  Airway & Oxygen Therapy: Patient Spontanous Breathing  Post-op Assessment: Report given to RN and Post -op Vital signs reviewed and stable  Post vital signs: Reviewed and stable  Last Vitals:  Vitals Value Taken Time  BP 121/56 11/08/23 0900  Temp 36.8 C 11/08/23 0900  Pulse 86 11/08/23 0900  Resp 15 11/08/23 0900  SpO2 99 % 11/08/23 0900  Vitals shown include unfiled device data.  Last Pain:  Vitals:   11/08/23 0900  TempSrc: Tympanic  PainSc: 0-No pain         Complications: No notable events documented.

## 2023-11-08 NOTE — H&P (View-Only) (Signed)
Date of initial consult: 05/01/2023  HISTORY OF PRESENT ILLNESS: 11/08/2023 [name] is a 73 year old woman with a very complicated past medical history who is here today to follow-up.  She had a recent visit to the ER with an exacerbation of left upper extremity pain.  She states that this may or may not have been related to a fall.  In regards to her left upper extremity is mostly pain radiating into her shoulder.  She does have ulnar neuropathy on that side as well, however this current exacerbation does not fit that localization.  She does continue to have severe weakness on the right with intrinsic hand weakness and is scheduled for a ulnar nerve operation next month.  PHYSICAL EXAMINATION:   Vitals:   11/08/23 0610  BP: (!) 122/54  Pulse: 89  Resp: 16  Temp: 98 F (36.7 C)  SpO2: 100%   General: Patient is well developed, well nourished, calm, collected, and in no apparent distress.  NEUROLOGICAL:  General: In no acute distress.  Awake, alert, oriented to person, place, and time. Pupils equal round and reactive to light.   Strength: She shows evidence of ulnar clawing with a positive Wartenberg's and positive Froment's sign.  She has wasting of her first dorsal interosseous musculature.  Her grip strength is strong but her finger abduction is showing significant weakness.   ROS (Neurologic): Negative except as noted above  IMAGING/electrodiagnostics:   [facility]  [address] [place], [address]  [place], [place] [address] Tel: [phone] Fax: [phone] Test Date:  09/03/2023   Patient: Deanna [name] DOB: 1951-01-24 Physician: Jacquelyne Balint, MD  Sex: Female Height: 6\' 1"  Ref Phys: Ernestine Mcmurray, MD  ID#: [id number]     Technician:      History: This is a 73 year old female with numbness and pain in neck and arms.   NCV & EMG Findings: Extensive electrodiagnostic evaluation of bilateral upper limbs shows: Bilateral median and ulnar sensory  responses are absent. Bilateral radial sensory responses show reduced amplitude (L9, R9 V). Bilateral ulnar (ADM) motor responses show reduced amplitude (L5.4, R3.0 mV). Right ulnar motor response also shows decreased conduction velocity from above elbow to below elbow stimulation site (33 m/s). Chronic motor axon loss changes without accompanying active denervation changes are seen in bilateral first dorsal interosseous, bilateral extensor indicis proprius, right abductor digiti minimi, and bilateral cervical (C7 level) paraspinal muscles.   Impression: This is a complex study. 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       ___________________________ Jacquelyne Balint, MD    ASSESSMENT/PLAN:  [name] is doing well with her conservative management for her C3 anterior osteophyte fracture.  She has been evaluated and found to have a severe ulnar neuropathy bilaterally.  She also has a chronic right-sided C8 lesion.  We are planning on addressing her right sided upper extremity weakness and ulnar neuropathy next month.  At the current time her left upper extremity pain exacerbation does not clearly localize to the ulnar nerve.  Could be a direct result of  her fall and stretching of any nerves nearby.  Will plan to move forward with a right upper extremity intervention as planned. For the right side given her severe progressive weakness refractory to conservative care in the ulnar nerve, we'll play for decompression and AIN  nerve transfer.   Lovenia Kim, MD/MSCR Department of Neurosurgery

## 2023-11-08 NOTE — Discharge Instructions (Addendum)
Your surgeon has performed an operation on the nerves in your upper extremity. Many times, patients feel better immediately after surgery and can "overdo it." Even if you feel well, it is important that you follow these activity guidelines. If you do not let your nerve heal properly from the surgery, you can increase the chance of return of your symptoms. The following are instructions to help in your recovery once you have been discharged from the hospital.  It is normal for your nerves to feel increased "tingling" after surgery or a sensation that the nerve is "waking up".  This generally will resolve in a short period of time, within 1 to 2 weeks.  * It is ok to take NSAIDs after surgery.  Activity    Increase physical activity slowly as tolerated.  Taking short walks is encouraged, but avoid strenuous exercise. Do not jog, run, bicycle, lift weights, or participate in any other exercises unless specifically allowed by your doctor. Avoid prolonged sitting, including car rides.  For the first few days after surgery, avoid any use of the extremity more than a glass of water or for eating.  Specific instructions were given to you based off of your specific procedure.  You should not drive until no longer taking any new pain medication.  You may shower three days after your surgery.  After showering, lightly dab your incision dry. Do not take a tub bath or go swimming for 3 weeks, or until approved by your doctor at your follow-up appointment.  If you smoke, we strongly recommend that you quit.  Smoking has been proven to interfere with normal healing in your back and will dramatically reduce the success rate of your surgery. Please contact [facility] (800-QUIT-NOW) and use the resources at www.[facility].com for assistance in stopping smoking.  Surgical Incision   If you have a dressing on your incision, you may remove it three days after your surgery. Keep your incision area clean and dry.  Your  sutures are under your skin and your wound is covered with surgical glue.  The glue should begin to peel away within about a week.   Diet            You may return to your usual diet. Be sure to stay hydrated.  When to Contact us  Although your surgery and recovery will likely be uneventful, you may have some residual numbness, aches, and pains in your back and/or legs. This is normal and should improve in the next few weeks.  However, should you experience any of the following, contact us immediately: New numbness or weakness Pain that is progressively getting worse, and is not relieved by your pain medications or rest Bleeding, redness, swelling, pain, or drainage from surgical incision Chills or flu-like symptoms Fever greater than 101.0 F (38.3 C) Problems with bowel or bladder functions Difficulty breathing or shortness of breath Warmth, tenderness, or swelling in your calf  Contact Information How to contact us:  If you have any questions/concerns before or after surgery, you can reach [place] at [phone], or you can send a mychart message. We can be reached by phone or mychart 8am-4pm, Monday-Friday.  *Please note: Calls after 4pm are forwarded to a third party answering service. Mychart messages are not routinely monitored during evenings, weekends, and holidays. Please call our office to contact the answering service for urgent concerns during non-business hours.

## 2023-11-08 NOTE — Progress Notes (Signed)
Date of initial consult: 05/01/2023  HISTORY OF PRESENT ILLNESS: 11/08/2023 [name] is a 73 year old woman with a very complicated past medical history who is here today to follow-up.  She had a recent visit to the ER with an exacerbation of left upper extremity pain.  She states that this may or may not have been related to a fall.  In regards to her left upper extremity is mostly pain radiating into her shoulder.  She does have ulnar neuropathy on that side as well, however this current exacerbation does not fit that localization.  She does continue to have severe weakness on the right with intrinsic hand weakness and is scheduled for a ulnar nerve operation next month.  PHYSICAL EXAMINATION:   Vitals:   11/08/23 0610  BP: (!) 122/54  Pulse: 89  Resp: 16  Temp: 98 F (36.7 C)  SpO2: 100%   General: Patient is well developed, well nourished, calm, collected, and in no apparent distress.  NEUROLOGICAL:  General: In no acute distress.  Awake, alert, oriented to person, place, and time. Pupils equal round and reactive to light.   Strength: She shows evidence of ulnar clawing with a positive Wartenberg's and positive Froment's sign.  She has wasting of her first dorsal interosseous musculature.  Her grip strength is strong but her finger abduction is showing significant weakness.   ROS (Neurologic): Negative except as noted above  IMAGING/electrodiagnostics:   [facility]  [address], [address]  [place], [place] [address] Tel: [phone] Fax: [phone] Test Date:  09/03/2023   Patient: Deanna [name] DOB: 1951-01-24 Physician: Jacquelyne Balint, MD  Sex: Female Height: 6\' 1"  Ref Phys: Ernestine Mcmurray, MD  ID#: [id number]     Technician:      History: This is a 73 year old female with numbness and pain in neck and arms.   NCV & EMG Findings: Extensive electrodiagnostic evaluation of bilateral upper limbs shows: Bilateral median and ulnar sensory  responses are absent. Bilateral radial sensory responses show reduced amplitude (L9, R9 V). Bilateral ulnar (ADM) motor responses show reduced amplitude (L5.4, R3.0 mV). Right ulnar motor response also shows decreased conduction velocity from above elbow to below elbow stimulation site (33 m/s). Chronic motor axon loss changes without accompanying active denervation changes are seen in bilateral first dorsal interosseous, bilateral extensor indicis proprius, right abductor digiti minimi, and bilateral cervical (C7 level) paraspinal muscles.   Impression: This is a complex study. 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       ___________________________ Jacquelyne Balint, MD    ASSESSMENT/PLAN:  [name] is doing well with her conservative management for her C3 anterior osteophyte fracture.  She has been evaluated and found to have a severe ulnar neuropathy bilaterally.  She also has a chronic right-sided C8 lesion.  We are planning on addressing her right sided upper extremity weakness and ulnar neuropathy next month.  At the current time her left upper extremity pain exacerbation does not clearly localize to the ulnar nerve.  Could be a direct result of  her fall and stretching of any nerves nearby.  Will plan to move forward with a right upper extremity intervention as planned. For the right side given her severe progressive weakness refractory to conservative care in the ulnar nerve, we'll play for decompression and AIN  nerve transfer.   Lovenia Kim, MD/MSCR Department of Neurosurgery

## 2023-11-08 NOTE — Op Note (Signed)
Indications: 73 year old woman with a history of with history of progressive right-sided ulnar neuropathy and hand weakness refractory to conservative management.  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  Preoperative Diagnosis:  Right sided ulnar neuropathy at the elbow Right sided hand weakness Postoperative Diagnosis: same   EBL: 10 cc IVF: See anesthesia report Drains: none Disposition:Stable to PACU Complications: none  No foley catheter was placed.   Preoperative Note: 73 year old woman with a history of progressive right ulnar neuropathy and hand weakness refractory to conservative management.  They had tried rest, padding, and watchful waiting but had continued progressive symptoms.  Given the progression of her ulnar neuropathy plan was made for ulnar nerve decompression without transposition.  Risk of surgery is discussed and include: Infection, bleeding, wound healing issues, nerve injury, pain, failure to relieve the symptoms, need for further surgery.  Procedure:  1) right ulnar nerve decompression at the elbow 2) AIN to ulnar end to side nerve transfer 3 ) microsurgery requiring microscope   Procedure: After obtaining informed consent, the patient taken to the operating room, placed in supine position, monitored anesthesia care was induced.  They were given preoperative antibiotics.  Prepped and draped in the usual fashion.  Comprehensive timeout was performed verifying the patient's name, MRN, planned procedure.  The humerus was padded, elbow was externally rotated, a curvilinear incision over the medial aspect of the elbow was planned.  This went proximal to the medial epicondyle as well as distal to cover the space from the cubital tunnel to the intermuscular septum/arcade of Struthers.  Local anesthetic was injected, skin was opened sharply, the skin was dissected down to the subcutaneous fascia.   Care was taken not to sacrifice any cutaneous nerves while doing this dissection.  We are able to feel and palpate the intermuscular septum.  This was where we are able to identify the nerve initially.  The nerve was underneath the septum but not being compressed by it, it was however being compressed by a arcade of Struthers.  This was divided sharply.  We then continued to follow the nerve distally decompressing on the way.  We found it in the retrocondylar groove, we removed all the soft tissue overlying.  At this point we noted the severe area of compression secondary to an accessory aocneuss causing severe compression of the nerve we divided the muscle and its fascia.  We then continue to follow it distally.  We are able to identify the fascia overlying the heads of the FCU.  This was divided sharply.  We are then able to split the heads of the FCU and identify Osborne's band.  This was severely compressive.  We then divided this.  At this point the nerve was well decompressed from proximal to the arcade of Struthers to distal to Osborne's band.  There were no areas of ongoing compression.  The nerve did not dislocate.  When the elbow was bent there were no evidence of any ongoing compression.  The wound was copiously irrigated.  The wound was then closed in multiple layers.  Sterile dressing was applied.  We then utilized intraoperative ultrasound to identify the pronator quadratus muscle, we fashioned an incision over top of the pronator quadratus and injected this with local anesthetic.  We opened this sharply.  We are able to identify the ulnar nerve and ulnar artery.  These were dissected away from 1 another.  We able to clearly see the fascicular structure  of the ulnar nerve at the forearm.  We are able to identify the motor fascicle as well.  We then retracted the flexor mass towards the radial aspect of the forearm.  We are able to identify the pronator quadratus.  Under microscopic guidance we  were able to identify the nerve to the pronator quadratus.  We dissected this both proximally and distally.  We utilized bipolar cautery to continue to maintain meticulous hemostasis.  We dissected distally until we are noticing a branching pattern.  At this point we divided the AIN branch to the pronator quadratus distally.  We then circumferentially neurolysis and followed it up to the most proximal point.  We translocated this medially and were able to bring it to the motor fascicle of the ulnar nerve in the distal forearm.  Again under microscopic guidance we performed an epi neurotomy of the ulnar nerve motor fascicle, we then utilized interrupted 8-0 nylon sutures to coapt the AIN nerve to the side of the ulnar motor branch and and then decide fashion.  We utilized sutures 180 degrees from 1 another.  We then reinforced this with tissue glue.  We irrigated copiously.  We obtained meticulous hemostasis.  This wound was then closed in multiple layers with glue on the superficial most aspect.  Sterile dressings were applied.  No immediate complications.  Sponge and pattie counts were correct at the end of the procedure.   I performed this procedure without an Designer, television/film set.  Lovenia Kim, MD/MSCR

## 2023-11-08 NOTE — Interval H&P Note (Signed)
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  The various methods of treatment have been discussed with the patient and family. After consideration of risks, benefits and other options for treatment, the patient has consented to  Procedure(s): RIGHT ULNAR NERVE DECOMPRESSION (Right) ANTERIOR INTEROSSEOUS NERVE TO ULNAR NERVE TRANSFER, END TO SIDE (Right) as a surgical intervention.  The patient's history has been reviewed, patient examined, no change in status, stable for surgery.  I have reviewed the patient's chart and labs.  Questions were answered to the patient's satisfaction.    Heart and Lungs Clear  Lovenia Kim

## 2023-11-08 NOTE — Anesthesia Procedure Notes (Signed)
Procedure Name: MAC Date/Time: 11/08/2023 7:20 AM  Performed by: Lily Lovings, CRNAPre-anesthesia Checklist: Patient identified, Emergency Drugs available, Suction available and Patient being monitored Patient Re-evaluated:Patient Re-evaluated prior to induction Oxygen Delivery Method: Simple face mask Preoxygenation: Pre-oxygenation with 100% oxygen Induction Type: IV induction

## 2023-11-09 ENCOUNTER — Encounter: Payer: Self-pay | Admitting: Neurosurgery

## 2023-11-14 ENCOUNTER — Encounter: Payer: Medicare HMO | Admitting: Neurosurgery

## 2023-11-17 NOTE — Progress Notes (Unsigned)
   REFERRING PHYSICIAN:  No referring provider defined for this encounter.  DOS: 11/08/23 right ulnar nerve decompression at the elbow, AIN to ulnar end to side nerve transfer  HISTORY OF PRESENT ILLNESS: Deanna [name] is 2 weeks status post above surgery.   Overall she is doing well. She has no numbness or tingling in her right arm. She feels like her motion is better and that her strength is improved.   She is taking plavix, percocet, lyrica, and neurontin.   She wants to set up surgery on her left arm.    PHYSICAL EXAMINATION:  NEUROLOGICAL:  General: In no acute distress.   Awake, alert, oriented to person, place, and time.  Pupils equal round and reactive to light.  Facial tone is symmetric.    She has full ROM of hand, wrist, elbow on right. She can make a full fist.   Side Biceps Triceps Deltoid Interossei Grip Wrist Ext. Wrist Flex.  R 5 5 5 5 5 5 5   L 5 5 5 5 5 5 5    Incisions c/d/i  Imaging:  Nothing new to review.   Assessment / Plan: Deanna [name] is doing well s/p above surgery. Treatment options reviewed with patient and following plan made:   - She can return to activity as tolerated.   - Reviewed wound care.  - Follow up as scheduled in 4 weeks and prn.  - Will ask Dr. Katrinka Blazing about further surgery on her left arm and call her about this.   ADDENDUM 11/22/23:  Reviewed with Dr. Katrinka Blazing he wants to get updated EMG of her left upper extremity prior to her follow up with him. Order put in for [facility] and staff will call patient to let her know.   Advised to contact the office if any questions or concerns arise.   Drake Leach PA-C Dept of Neurosurgery

## 2023-11-21 ENCOUNTER — Encounter (INDEPENDENT_AMBULATORY_CARE_PROVIDER_SITE_OTHER): Payer: Medicare HMO

## 2023-11-21 ENCOUNTER — Encounter: Payer: Medicare HMO | Admitting: Physician Assistant

## 2023-11-21 ENCOUNTER — Ambulatory Visit (INDEPENDENT_AMBULATORY_CARE_PROVIDER_SITE_OTHER): Payer: Medicare HMO | Admitting: Nurse Practitioner

## 2023-11-22 ENCOUNTER — Ambulatory Visit (INDEPENDENT_AMBULATORY_CARE_PROVIDER_SITE_OTHER): Payer: 59 | Admitting: Orthopedic Surgery

## 2023-11-22 ENCOUNTER — Telehealth: Payer: Self-pay | Admitting: Orthopedic Surgery

## 2023-11-22 ENCOUNTER — Encounter: Payer: Self-pay | Admitting: Orthopedic Surgery

## 2023-11-22 VITALS — BP 114/62 | Temp 97.9°F | Ht 73.5 in | Wt 156.0 lb

## 2023-11-22 DIAGNOSIS — G5621 Lesion of ulnar nerve, right upper limb: Secondary | ICD-10-CM

## 2023-11-22 DIAGNOSIS — R200 Anesthesia of skin: Secondary | ICD-10-CM

## 2023-11-22 DIAGNOSIS — Z09 Encounter for follow-up examination after completed treatment for conditions other than malignant neoplasm: Secondary | ICD-10-CM

## 2023-11-22 DIAGNOSIS — Z9889 Other specified postprocedural states: Secondary | ICD-10-CM

## 2023-11-22 NOTE — Telephone Encounter (Signed)
 Please call patient and let her know that I discussed everything with Dr. Katrinka Blazing. He wants to get an updated EMG of her left arm. I put the order in with [facility] and they should call her.   Thanks.

## 2023-11-22 NOTE — Telephone Encounter (Signed)
 Spoke to patient's nurse at [facility] and informed her that [employer] will be reaching out to set up an appointment to have an EMG for her left arm. They will wait on the call.

## 2023-11-22 NOTE — Addendum Note (Signed)
 Addended byDrake Leach on: 11/22/2023 12:47 PM   Modules accepted: Orders

## 2023-11-23 ENCOUNTER — Ambulatory Visit (INDEPENDENT_AMBULATORY_CARE_PROVIDER_SITE_OTHER): Payer: Medicare HMO

## 2023-11-23 DIAGNOSIS — I70219 Atherosclerosis of native arteries of extremities with intermittent claudication, unspecified extremity: Secondary | ICD-10-CM

## 2023-11-26 LAB — CUP PACEART REMOTE DEVICE CHECK
Date Time Interrogation Session: 20250228171810
Implantable Pulse Generator Implant Date: 20241113

## 2023-11-27 NOTE — Progress Notes (Signed)
 Carelink Summary Report / Loop Recorder

## 2023-11-27 NOTE — Addendum Note (Signed)
 Addended by: Elease Etienne A on: 11/27/2023 02:20 PM   Modules accepted: Orders

## 2023-12-13 ENCOUNTER — Other Ambulatory Visit: Payer: Self-pay

## 2023-12-13 DIAGNOSIS — R202 Paresthesia of skin: Secondary | ICD-10-CM

## 2023-12-19 ENCOUNTER — Encounter: Payer: Medicare HMO | Admitting: Neurosurgery

## 2023-12-19 ENCOUNTER — Other Ambulatory Visit (INDEPENDENT_AMBULATORY_CARE_PROVIDER_SITE_OTHER): Payer: Self-pay | Admitting: Vascular Surgery

## 2023-12-19 DIAGNOSIS — Z9889 Other specified postprocedural states: Secondary | ICD-10-CM

## 2023-12-19 DIAGNOSIS — N186 End stage renal disease: Secondary | ICD-10-CM

## 2023-12-20 ENCOUNTER — Telehealth (INDEPENDENT_AMBULATORY_CARE_PROVIDER_SITE_OTHER): Payer: Self-pay

## 2023-12-20 ENCOUNTER — Ambulatory Visit (INDEPENDENT_AMBULATORY_CARE_PROVIDER_SITE_OTHER): Payer: Medicare HMO

## 2023-12-20 ENCOUNTER — Encounter (INDEPENDENT_AMBULATORY_CARE_PROVIDER_SITE_OTHER): Payer: Self-pay | Admitting: Nurse Practitioner

## 2023-12-20 ENCOUNTER — Ambulatory Visit (INDEPENDENT_AMBULATORY_CARE_PROVIDER_SITE_OTHER): Payer: Medicare HMO | Admitting: Nurse Practitioner

## 2023-12-20 ENCOUNTER — Ambulatory Visit (INDEPENDENT_AMBULATORY_CARE_PROVIDER_SITE_OTHER)

## 2023-12-20 VITALS — BP 126/79 | HR 82 | Resp 18 | Ht 73.5 in | Wt 161.3 lb

## 2023-12-20 DIAGNOSIS — I739 Peripheral vascular disease, unspecified: Secondary | ICD-10-CM | POA: Diagnosis not present

## 2023-12-20 DIAGNOSIS — Z9889 Other specified postprocedural states: Secondary | ICD-10-CM

## 2023-12-20 DIAGNOSIS — N186 End stage renal disease: Secondary | ICD-10-CM | POA: Diagnosis not present

## 2023-12-20 DIAGNOSIS — I10 Essential (primary) hypertension: Secondary | ICD-10-CM

## 2023-12-20 NOTE — Telephone Encounter (Signed)
 Spoke with Davette at [facility] to schedule the patient for a LLE angio with Dr. Wyn Quaker at the [facility]. Arrival time is 11:00 am on 01/03/24. Pre-procedure instructions were discussed and will be faxed to attention Davette at [facility]

## 2023-12-21 NOTE — Addendum Note (Signed)
 Addended by: Elease Etienne A on: 12/21/2023 09:27 AM   Modules accepted: Orders

## 2023-12-21 NOTE — Progress Notes (Signed)
 Carelink Summary Report / Loop Recorder

## 2023-12-24 ENCOUNTER — Encounter (INDEPENDENT_AMBULATORY_CARE_PROVIDER_SITE_OTHER): Payer: Self-pay | Admitting: Nurse Practitioner

## 2023-12-24 ENCOUNTER — Encounter: Payer: Medicare HMO | Admitting: Physician Assistant

## 2023-12-24 LAB — VAS US ABI WITH/WO TBI
Left ABI: 0.92
Right ABI: 1.16

## 2023-12-24 NOTE — Progress Notes (Signed)
 Subjective:    Patient ID: Deanna [name], female    DOB: 12/28/1950, 73 y.o.   MRN: [id number] Chief Complaint  Patient presents with   Follow-up    3 month HDA + ABI/LEA    The patient returns today for follow-up evaluation of her peripheral arterial disease as well as end-stage renal disease.  She denies any issues currently during dialysis.  She has a good thrill and bruit and has a flow volume of 1011.  Today the patient has ABIs of 1.16 on the right and 0.92 on the left.  This is an adjustment from the previous studies which showed a right ABI of 1.11 on the left of 1.18.  Additional arterial duplex shows that she has monophasic/biphasic in the right lower extremity as well as multiphasic in the left.  Her posterior tibial artery is occluded bilaterally.  She endorses having pain that is shooting from her left groin down her left leg.  She notes that this pain happened after her most recent angiogram.    Review of Systems  Neurological:  Positive for weakness.       Objective:   Physical Exam Vitals reviewed.  HENT:     Head: Normocephalic.  Cardiovascular:     Rate and Rhythm: Normal rate.  Skin:    General: Skin is warm and dry.  Neurological:     Mental Status: She is alert and oriented to person, place, and time.  Psychiatric:        Mood and Affect: Mood normal.        Behavior: Behavior normal.        Thought Content: Thought content normal.        Judgment: Judgment normal.     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   required intubation   Anemia    C3 cervical fracture (HCC) 04/2023   Cervical stress fracture    CHF (congestive heart failure) (HCC)    combined systolic and diastolic   Coronary artery disease    ESRD on dialysis (HCC)    M-W-Fr   GERD (gastroesophageal reflux disease)     Hyperlipidemia    Hypertension    Pneumonia    Secondary hyperparathyroidism of renal origin (HCC)    Stroke (HCC)    with right sided weakness   Type 2 diabetes mellitus with diabetic neuropathy (HCC)    Ulnar neuropathy at elbow of right upper extremity     Social History   Socioeconomic History   Marital status: Single    Spouse name: Not on file   Number of children: 3   Years of education: Not on file   Highest education level: Not on file  Occupational History   Not on file  Tobacco Use   Smoking status: Never   Smokeless tobacco: Never  Vaping Use   Vaping status: Never Used  Substance and Sexual Activity   Alcohol use: No   Drug use: No   Sexual activity: Not Currently  Other Topics Concern   Not on file  Social History Narrative   Temporarily staying  at [facility].   Social Drivers of Corporate investment banker Strain: Not on file  Food Insecurity: No Food Insecurity (08/04/2023)   Hunger Vital Sign  Worried About Programme researcher, broadcasting/film/video in the Last Year: Never true    Ran Out of Food in the Last Year: Never true  Transportation Needs: No Transportation Needs (08/04/2023)   PRAPARE - Administrator, Civil Service (Medical): No    Lack of Transportation (Non-Medical): No  Physical Activity: Not on file  Stress: Not on file  Social Connections: Not on file  Intimate Partner Violence: Not At Risk (08/04/2023)   Humiliation, Afraid, Rape, and Kick questionnaire    Fear of Current or Ex-Partner: No    Emotionally Abused: No    Physically Abused: No    Sexually Abused: No    Past Surgical History:  Procedure Laterality Date   A/V FISTULAGRAM Right 06/28/2023   Procedure: A/V Fistulagram;  Surgeon: Annice Needy, MD;  Location: [facility];  Service: Cardiovascular;  Laterality: Right;   AV FISTULA PLACEMENT Right 05/17/2023   Procedure: INSERTION OF ARTERIOVENOUS (AV) GORE-TEX GRAFT ARM;  Surgeon: Annice Needy, MD;  Location: [facility]  ORS;  Service: Vascular;  Laterality: Right;   CARDIAC CATHETERIZATION Left 03/01/2016   Procedure: Left Heart Cath and Coronary Angiography;  Surgeon: Dalia Heading, MD;  Location: [facility];  Service: Cardiovascular;  Laterality: Left;   CESAREAN SECTION     DIALYSIS/PERMA CATHETER INSERTION Right 01/10/2022   Procedure: DIALYSIS/PERMA CATHETER INSERTION;  Surgeon: Bertram Denver, MD;  Location: [facility];  Service: Cardiovascular;  Laterality: Right;   DIALYSIS/PERMA CATHETER INSERTION N/A 04/12/2022   Procedure: DIALYSIS/PERMA CATHETER INSERTION;  Surgeon: Annice Needy, MD;  Location: [facility];  Service: Cardiovascular;  Laterality: N/A;   DIALYSIS/PERMA CATHETER REMOVAL N/A 09/03/2023   Procedure: DIALYSIS/PERMA CATHETER REMOVAL;  Surgeon: Annice Needy, MD;  Location: [facility];  Service: Cardiovascular;  Laterality: N/A;   INTUBATION-ENDOTRACHEAL WITH TRACHEOSTOMY STANDBY Left 01/14/2022   Procedure: INTUBATION-NASAL WITH TRACHEOSTOMY STANDBY;  Surgeon: Linus Salmons, MD;  Location: [facility] ORS;  Service: ENT;  Laterality: Left;   LOOP RECORDER INSERTION Left 08/08/2023   Procedure: LOOP RECORDER INSERTION;  Surgeon: Lanier Prude, MD;  Location: [facility];  Service: Cardiovascular;  Laterality: Left;   LOWER EXTREMITY ANGIOGRAPHY Right 07/23/2023   Procedure: Lower Extremity Angiography;  Surgeon: Annice Needy, MD;  Location: [facility];  Service: Cardiovascular;  Laterality: Right;   NERVE TRANSFER Right 11/08/2023   Procedure: ANTERIOR INTEROSSEOUS NERVE TO ULNAR NERVE TRANSFER, END TO SIDE;  Surgeon: Lovenia Kim, MD;  Location: [facility] ORS;  Service: Neurosurgery;  Laterality: Right;   TEMPORARY DIALYSIS CATHETER N/A 01/04/2022   Procedure: TEMPORARY DIALYSIS CATHETER;  Surgeon: Annice Needy, MD;  Location: [facility];  Service: Cardiovascular;  Laterality: N/A;   ULNAR NERVE DECOMPRESSION Right 11/08/2023    Procedure: RIGHT ULNAR NERVE DECOMPRESSION;  Surgeon: Lovenia Kim, MD;  Location: [facility] OR[facility];  Service: Neurosurgery;  Laterality: Right;    Family History  Problem Relation Age of Onset   Osteoarthritis Brother     Allergies  Allergen Reactions   Ace Inhibitors Swelling   Entresto [Sacubitril-Valsartan] Swelling    Patient said she took both lisinopril and Entresto so it is not clear which one she actually reacted to.  For safety reasons, she has been advised to avoid both ACE inhibitors and Entresto.   Lisinopril Swelling    Severe Angioedema (requiring Nasotracheal intubation)   Peanut-Containing Drug Products    Penicillins Rash  Tomato Rash and Other (See Comments)       Latest Ref Rng & Units 11/08/2023    6:49 AM 10/11/2023    2:52 PM 08/14/2023    8:00 PM  CBC  WBC 4.0 - 10.5 K/uL  7.7  7.3   Hemoglobin 12.0 - 15.0 g/dL 21.3  08.6  57.8   Hematocrit 36.0 - 46.0 % 41.0  33.9  31.6   Platelets 150 - 400 K/uL  246  267       CMP     Component Value Date/Time   NA 138 11/08/2023 0649   K 3.9 11/08/2023 0649   CL 98 11/08/2023 0649   CO2 26 10/11/2023 1452   GLUCOSE 112 (H) 11/08/2023 0649   BUN 34 (H) 11/08/2023 0649   CREATININE 4.80 (H) 11/08/2023 0649   CALCIUM 8.5 (L) 10/11/2023 1452   PROT 7.9 10/11/2023 1452   ALBUMIN 3.7 10/11/2023 1452   AST 19 10/11/2023 1452   ALT 21 10/11/2023 1452   ALKPHOS 130 (H) 10/11/2023 1452   BILITOT 0.5 10/11/2023 1452   GFRNONAA 9 (L) 10/11/2023 1452     No results found.     Assessment & Plan:   1. ESRD (end stage renal disease) (HCC) (Primary) Recommend:  The patient is doing well and currently has adequate dialysis access. The patient's dialysis center is not reporting any access issues. Flow pattern is stable when compared to the prior ultrasound.  The patient should have a duplex ultrasound of the dialysis access in 6 months. The patient will follow-up with me in the office after each ultrasound     2. PAD (peripheral artery disease) (HCC) The patient underwent right lower extremity angiogram on 07/23/2023 but notes that she now has pain in her left groin area.  Her studies were limited based on the patient's mobility as well as what they were able to actually scan.  The patient's symptoms seem more so related to her lower back issues but given the patient's history of PAD she is concerned that she has significant issues in her left lower extremity.  Based on this concern we will have the patient undergo a left lower extremity angiogram for treatment.  We have discussed the risk benefits and alternatives the patient is agreeable to proceed with angiogram.  She is also explicitly told that she will go home, or to her facility, the day of the procedure.  She will not be kept overnight unless there is a significant medical reason to do so.  3. Essential (primary) hypertension Continue antihypertensive medications as already ordered, these medications have been reviewed and there are no changes at this time.   Current Outpatient Medications on File Prior to Visit  Medication Sig Dispense Refill   ammonium lactate (AMLACTIN) 12 % cream Apply topically.     aspirin EC 81 MG tablet Take 81 mg by mouth daily.     atorvastatin (LIPITOR) 40 MG tablet Take 40 mg by mouth daily.     calcium acetate (PHOSLO) 667 MG capsule Take 1,334 mg by mouth 3 (three) times daily.     clopidogrel (PLAVIX) 75 MG tablet Take 75 mg by mouth at bedtime.     ferrous sulfate 325 (65 FE) MG EC tablet Take 325 mg by mouth in the morning.     metoprolol tartrate (LOPRESSOR) 25 MG tablet Take 1 tablet (25 mg total) by mouth 2 (two) times daily. 60 tablet 0   ondansetron (ZOFRAN-ODT) 4 MG disintegrating tablet  oxyCODONE-acetaminophen (PERCOCET) 5-325 MG tablet Take 1 tablet by mouth every 4 (four) hours as needed for severe pain (pain score 7-10). 5 tablet 0   pregabalin (LYRICA) 25 MG capsule Take 25 mg by mouth at  bedtime.     Vitamin D, Ergocalciferol, (DRISDOL) 1.25 MG (50000 UNIT) CAPS capsule Take 50,000 Units by mouth once a week. Wednesday     gabapentin (NEURONTIN) 100 MG capsule Take 100 mg by mouth 2 (two) times daily as needed. (Patient not taking: Reported on 12/20/2023)     sevelamer (RENAGEL) 800 MG tablet Take 1,600 mg by mouth 3 (three) times daily with meals. (Patient not taking: Reported on 12/20/2023)     No current facility-administered medications on file prior to visit.    There are no Patient Instructions on file for this visit. No follow-ups on file.   Georgiana Spinner, NP

## 2023-12-24 NOTE — H&P (View-Only) (Signed)
 Subjective:    Patient ID: Deanna [name], female    DOB: 12/28/1950, 73 y.o.   MRN: [id number] Chief Complaint  Patient presents with   Follow-up    3 month HDA + ABI/LEA    The patient returns today for follow-up evaluation of her peripheral arterial disease as well as end-stage renal disease.  She denies any issues currently during dialysis.  She has a good thrill and bruit and has a flow volume of 1011.  Today the patient has ABIs of 1.16 on the right and 0.92 on the left.  This is an adjustment from the previous studies which showed a right ABI of 1.11 on the left of 1.18.  Additional arterial duplex shows that she has monophasic/biphasic in the right lower extremity as well as multiphasic in the left.  Her posterior tibial artery is occluded bilaterally.  She endorses having pain that is shooting from her left groin down her left leg.  She notes that this pain happened after her most recent angiogram.    Review of Systems  Neurological:  Positive for weakness.       Objective:   Physical Exam Vitals reviewed.  HENT:     Head: Normocephalic.  Cardiovascular:     Rate and Rhythm: Normal rate.  Skin:    General: Skin is warm and dry.  Neurological:     Mental Status: She is alert and oriented to person, place, and time.  Psychiatric:        Mood and Affect: Mood normal.        Behavior: Behavior normal.        Thought Content: Thought content normal.        Judgment: Judgment normal.     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   required intubation   Anemia    C3 cervical fracture (HCC) 04/2023   Cervical stress fracture    CHF (congestive heart failure) (HCC)    combined systolic and diastolic   Coronary artery disease    ESRD on dialysis (HCC)    M-W-Fr   GERD (gastroesophageal reflux disease)     Hyperlipidemia    Hypertension    Pneumonia    Secondary hyperparathyroidism of renal origin (HCC)    Stroke (HCC)    with right sided weakness   Type 2 diabetes mellitus with diabetic neuropathy (HCC)    Ulnar neuropathy at elbow of right upper extremity     Social History   Socioeconomic History   Marital status: Single    Spouse name: Not on file   Number of children: 3   Years of education: Not on file   Highest education level: Not on file  Occupational History   Not on file  Tobacco Use   Smoking status: Never   Smokeless tobacco: Never  Vaping Use   Vaping status: Never Used  Substance and Sexual Activity   Alcohol use: No   Drug use: No   Sexual activity: Not Currently  Other Topics Concern   Not on file  Social History Narrative   Temporarily staying  at [facility].   Social Drivers of Corporate investment banker Strain: Not on file  Food Insecurity: No Food Insecurity (08/04/2023)   Hunger Vital Sign  Worried About Programme researcher, broadcasting/film/video in the Last Year: Never true    Ran Out of Food in the Last Year: Never true  Transportation Needs: No Transportation Needs (08/04/2023)   PRAPARE - Administrator, Civil Service (Medical): No    Lack of Transportation (Non-Medical): No  Physical Activity: Not on file  Stress: Not on file  Social Connections: Not on file  Intimate Partner Violence: Not At Risk (08/04/2023)   Humiliation, Afraid, Rape, and Kick questionnaire    Fear of Current or Ex-Partner: No    Emotionally Abused: No    Physically Abused: No    Sexually Abused: No    Past Surgical History:  Procedure Laterality Date   A/V FISTULAGRAM Right 06/28/2023   Procedure: A/V Fistulagram;  Surgeon: Annice Needy, MD;  Location: [facility];  Service: Cardiovascular;  Laterality: Right;   AV FISTULA PLACEMENT Right 05/17/2023   Procedure: INSERTION OF ARTERIOVENOUS (AV) GORE-TEX GRAFT ARM;  Surgeon: Annice Needy, MD;  Location: [facility];  Service: Vascular;  Laterality: Right;   CARDIAC CATHETERIZATION Left 03/01/2016   Procedure: Left Heart Cath and Coronary Angiography;  Surgeon: Dalia Heading, MD;  Location: [facility];  Service: Cardiovascular;  Laterality: Left;   CESAREAN SECTION     DIALYSIS/PERMA CATHETER INSERTION Right 01/10/2022   Procedure: DIALYSIS/PERMA CATHETER INSERTION;  Surgeon: Bertram Denver, MD;  Location: [facility];  Service: Cardiovascular;  Laterality: Right;   DIALYSIS/PERMA CATHETER INSERTION N/A 04/12/2022   Procedure: DIALYSIS/PERMA CATHETER INSERTION;  Surgeon: Annice Needy, MD;  Location: [facility];  Service: Cardiovascular;  Laterality: N/A;   DIALYSIS/PERMA CATHETER REMOVAL N/A 09/03/2023   Procedure: DIALYSIS/PERMA CATHETER REMOVAL;  Surgeon: Annice Needy, MD;  Location: [facility];  Service: Cardiovascular;  Laterality: N/A;   INTUBATION-ENDOTRACHEAL WITH TRACHEOSTOMY STANDBY Left 01/14/2022   Procedure: INTUBATION-NASAL WITH TRACHEOSTOMY STANDBY;  Surgeon: Linus Salmons, MD;  Location: [facility];  Service: ENT;  Laterality: Left;   LOOP RECORDER INSERTION Left 08/08/2023   Procedure: LOOP RECORDER INSERTION;  Surgeon: Lanier Prude, MD;  Location: [facility];  Service: Cardiovascular;  Laterality: Left;   LOWER EXTREMITY ANGIOGRAPHY Right 07/23/2023   Procedure: Lower Extremity Angiography;  Surgeon: Annice Needy, MD;  Location: [facility];  Service: Cardiovascular;  Laterality: Right;   NERVE TRANSFER Right 11/08/2023   Procedure: ANTERIOR INTEROSSEOUS NERVE TO ULNAR NERVE TRANSFER, END TO SIDE;  Surgeon: Lovenia Kim, MD;  Location: [facility];  Service: Neurosurgery;  Laterality: Right;   TEMPORARY DIALYSIS CATHETER N/A 01/04/2022   Procedure: TEMPORARY DIALYSIS CATHETER;  Surgeon: Annice Needy, MD;  Location: [facility];  Service: Cardiovascular;  Laterality: N/A;   ULNAR NERVE DECOMPRESSION Right 11/08/2023    Procedure: RIGHT ULNAR NERVE DECOMPRESSION;  Surgeon: Lovenia Kim, MD;  Location: [facility];  Service: Neurosurgery;  Laterality: Right;    Family History  Problem Relation Age of Onset   Osteoarthritis Brother     Allergies  Allergen Reactions   Ace Inhibitors Swelling   Entresto [Sacubitril-Valsartan] Swelling    Patient said she took both lisinopril and Entresto so it is not clear which one she actually reacted to.  For safety reasons, she has been advised to avoid both ACE inhibitors and Entresto.   Lisinopril Swelling    Severe Angioedema (requiring Nasotracheal intubation)   Peanut-Containing Drug Products    Penicillins Rash  Tomato Rash and Other (See Comments)       Latest Ref Rng & Units 11/08/2023    6:49 AM 10/11/2023    2:52 PM 08/14/2023    8:00 PM  CBC  WBC 4.0 - 10.5 K/uL  7.7  7.3   Hemoglobin 12.0 - 15.0 g/dL 21.3  08.6  57.8   Hematocrit 36.0 - 46.0 % 41.0  33.9  31.6   Platelets 150 - 400 K/uL  246  267       CMP     Component Value Date/Time   NA 138 11/08/2023 0649   K 3.9 11/08/2023 0649   CL 98 11/08/2023 0649   CO2 26 10/11/2023 1452   GLUCOSE 112 (H) 11/08/2023 0649   BUN 34 (H) 11/08/2023 0649   CREATININE 4.80 (H) 11/08/2023 0649   CALCIUM 8.5 (L) 10/11/2023 1452   PROT 7.9 10/11/2023 1452   ALBUMIN 3.7 10/11/2023 1452   AST 19 10/11/2023 1452   ALT 21 10/11/2023 1452   ALKPHOS 130 (H) 10/11/2023 1452   BILITOT 0.5 10/11/2023 1452   GFRNONAA 9 (L) 10/11/2023 1452     No results found.     Assessment & Plan:   1. ESRD (end stage renal disease) (HCC) (Primary) Recommend:  The patient is doing well and currently has adequate dialysis access. The patient's dialysis center is not reporting any access issues. Flow pattern is stable when compared to the prior ultrasound.  The patient should have a duplex ultrasound of the dialysis access in 6 months. The patient will follow-up with me in the office after each ultrasound     2. PAD (peripheral artery disease) (HCC) The patient underwent right lower extremity angiogram on 07/23/2023 but notes that she now has pain in her left groin area.  Her studies were limited based on the patient's mobility as well as what they were able to actually scan.  The patient's symptoms seem more so related to her lower back issues but given the patient's history of PAD she is concerned that she has significant issues in her left lower extremity.  Based on this concern we will have the patient undergo a left lower extremity angiogram for treatment.  We have discussed the risk benefits and alternatives the patient is agreeable to proceed with angiogram.  She is also explicitly told that she will go home, or to her facility, the day of the procedure.  She will not be kept overnight unless there is a significant medical reason to do so.  3. Essential (primary) hypertension Continue antihypertensive medications as already ordered, these medications have been reviewed and there are no changes at this time.   Current Outpatient Medications on File Prior to Visit  Medication Sig Dispense Refill   ammonium lactate (AMLACTIN) 12 % cream Apply topically.     aspirin EC 81 MG tablet Take 81 mg by mouth daily.     atorvastatin (LIPITOR) 40 MG tablet Take 40 mg by mouth daily.     calcium acetate (PHOSLO) 667 MG capsule Take 1,334 mg by mouth 3 (three) times daily.     clopidogrel (PLAVIX) 75 MG tablet Take 75 mg by mouth at bedtime.     ferrous sulfate 325 (65 FE) MG EC tablet Take 325 mg by mouth in the morning.     metoprolol tartrate (LOPRESSOR) 25 MG tablet Take 1 tablet (25 mg total) by mouth 2 (two) times daily. 60 tablet 0   ondansetron (ZOFRAN-ODT) 4 MG disintegrating tablet  oxyCODONE-acetaminophen (PERCOCET) 5-325 MG tablet Take 1 tablet by mouth every 4 (four) hours as needed for severe pain (pain score 7-10). 5 tablet 0   pregabalin (LYRICA) 25 MG capsule Take 25 mg by mouth at  bedtime.     Vitamin D, Ergocalciferol, (DRISDOL) 1.25 MG (50000 UNIT) CAPS capsule Take 50,000 Units by mouth once a week. Wednesday     gabapentin (NEURONTIN) 100 MG capsule Take 100 mg by mouth 2 (two) times daily as needed. (Patient not taking: Reported on 12/20/2023)     sevelamer (RENAGEL) 800 MG tablet Take 1,600 mg by mouth 3 (three) times daily with meals. (Patient not taking: Reported on 12/20/2023)     No current facility-administered medications on file prior to visit.    There are no Patient Instructions on file for this visit. No follow-ups on file.   Georgiana Spinner, NP

## 2023-12-25 ENCOUNTER — Ambulatory Visit (INDEPENDENT_AMBULATORY_CARE_PROVIDER_SITE_OTHER): Admitting: Physician Assistant

## 2023-12-25 ENCOUNTER — Encounter: Payer: Self-pay | Admitting: Physician Assistant

## 2023-12-25 VITALS — BP 132/70 | Temp 98.0°F | Ht 73.5 in | Wt 161.0 lb

## 2023-12-25 DIAGNOSIS — Z09 Encounter for follow-up examination after completed treatment for conditions other than malignant neoplasm: Secondary | ICD-10-CM

## 2023-12-25 DIAGNOSIS — G5621 Lesion of ulnar nerve, right upper limb: Secondary | ICD-10-CM

## 2023-12-25 DIAGNOSIS — M6281 Muscle weakness (generalized): Secondary | ICD-10-CM

## 2023-12-25 NOTE — Progress Notes (Unsigned)
 REFERRING PHYSICIAN:  [facility] [address] [place],  [place] [address]  DOS: 11/07/22, Right ulnar nerve decompression, anterior interosseous nerve and ulnar nerve transfer end to side  HISTORY OF PRESENT ILLNESS: Deanna [name] is 6 weeks status post right ulnar nerve decompression, anterior interosseous nerve and ulnar nerve transfer end to side.  She states her right upper extremity is good however she says that yesterday she woke up and was not able to use her left arm at all.  She denies any pain in her neck however adds that she has pain in her right shoulder that goes down her whole entire hand feels numb.  She is unsure if her left lower extremity weakness is that new, but does not feel full strength.  Denies any headaches, change in vision.      PHYSICAL EXAMINATION:  NEUROLOGICAL:  General: In no acute distress.   Awake, alert, oriented to person, place, and time.  Pupils equal round and reactive to light.  Facial tone is symmetric.  Tongue protrusion is midline.  Cranial nerves grossly intact however patient is not compliant fully with examination.  Strength:  Concerning exam.  No flexion or extension of left wrist, 0/5.  Appears to have good bicep and tricep strength relatively.  Limited activation of deltoid.  I am able to passively range the left shoulder somewhat however his pain limited.  In bilateral lower extremities, difficult to exam.  Patient does appear to have some weakness in her left lower extremity as well, difficult to tell how much this is deviating from her baseline due to effort.  Incision c/d/I  Imaging:  No new imaging  Assessment / Plan: Deanna [name] is 6 weeks status post right ulnar nerve decompression, anterior interosseous nerve and ulnar nerve transfer end to side.  She states her right upper extremity is good however she says that yesterday she woke up and was not able to use her left arm at all.  She  denies any pain in her neck however adds that she has pain in her right shoulder that goes down her whole entire hand feels numb.  She is unsure if her left lower extremity weakness is that new, but does not feel full strength.  Denies any headaches, change in vision.  Concerning exam.  No flexion or extension of left wrist, 0/5.  Appears to have good bicep and tricep strength relatively.  Limited activation of deltoid.  I am able to passively range the left shoulder somewhat however his pain limited. In bilateral lower extremities, difficult to exam.  Patient does appear to have some weakness in her left lower extremity as well, difficult to tell how much this is deviating from her baseline due to effort.  It was a pleasure to see this patient in clinic today.  I told her how concerned I was that she may be having a stroke.  I advised that we take her to the emergency department for full evaluation.  Per her caregiver they attempted to take her to the ER yesterday from her facility for concerns of stroke as well but the patient refused.  I urged the patient to go to the ER as this was my formal recommendation, but she would not let us take her.  She adamantly refused.  The risks of not going to the emergency department were reviewed including death, permanent paralysis.  I asked her to reconsider.  I also placed an urgent referral to neurology.  Advised to contact the office if any questions or concerns arise.   Joan Flores PA-C Dept of Neurosurgery

## 2023-12-28 ENCOUNTER — Ambulatory Visit (INDEPENDENT_AMBULATORY_CARE_PROVIDER_SITE_OTHER): Payer: Medicare HMO

## 2023-12-28 DIAGNOSIS — I70219 Atherosclerosis of native arteries of extremities with intermittent claudication, unspecified extremity: Secondary | ICD-10-CM

## 2024-01-01 LAB — CUP PACEART REMOTE DEVICE CHECK
Date Time Interrogation Session: 20250404171811
Implantable Pulse Generator Implant Date: 20241113

## 2024-01-02 ENCOUNTER — Encounter: Payer: Self-pay | Admitting: Neurosurgery

## 2024-01-03 ENCOUNTER — Other Ambulatory Visit: Payer: Self-pay

## 2024-01-03 ENCOUNTER — Encounter
Admission: RE | Disposition: A | Discharge status: Home / Self Care | Payer: Self-pay | Source: Home / Self Care | Attending: Vascular Surgery

## 2024-01-03 ENCOUNTER — Ambulatory Visit
Admission: RE | Admit: 2024-01-03 | Discharge: 2024-01-03 | Disposition: A | Discharge status: Home / Self Care | Attending: Vascular Surgery | Admitting: Vascular Surgery

## 2024-01-03 DIAGNOSIS — E1122 Type 2 diabetes mellitus with diabetic chronic kidney disease: Secondary | ICD-10-CM | POA: Insufficient documentation

## 2024-01-03 DIAGNOSIS — Z79899 Other long term (current) drug therapy: Secondary | ICD-10-CM | POA: Insufficient documentation

## 2024-01-03 DIAGNOSIS — I5040 Unspecified combined systolic (congestive) and diastolic (congestive) heart failure: Secondary | ICD-10-CM | POA: Insufficient documentation

## 2024-01-03 DIAGNOSIS — M791 Myalgia, unspecified site: Secondary | ICD-10-CM

## 2024-01-03 DIAGNOSIS — I132 Hypertensive heart and chronic kidney disease with heart failure and with stage 5 chronic kidney disease, or end stage renal disease: Secondary | ICD-10-CM | POA: Insufficient documentation

## 2024-01-03 DIAGNOSIS — I70222 Atherosclerosis of native arteries of extremities with rest pain, left leg: Secondary | ICD-10-CM | POA: Diagnosis not present

## 2024-01-03 DIAGNOSIS — E1151 Type 2 diabetes mellitus with diabetic peripheral angiopathy without gangrene: Secondary | ICD-10-CM | POA: Insufficient documentation

## 2024-01-03 DIAGNOSIS — G609 Hereditary and idiopathic neuropathy, unspecified: Secondary | ICD-10-CM

## 2024-01-03 DIAGNOSIS — N186 End stage renal disease: Secondary | ICD-10-CM | POA: Insufficient documentation

## 2024-01-03 DIAGNOSIS — Z992 Dependence on renal dialysis: Secondary | ICD-10-CM | POA: Diagnosis not present

## 2024-01-03 DIAGNOSIS — I70229 Atherosclerosis of native arteries of extremities with rest pain, unspecified extremity: Secondary | ICD-10-CM

## 2024-01-03 HISTORY — PX: LOWER EXTREMITY ANGIOGRAPHY: CATH118251

## 2024-01-03 LAB — GLUCOSE, CAPILLARY
Glucose-Capillary: 87 mg/dL (ref 70–99)
Glucose-Capillary: 100 mg/dL — ABNORMAL HIGH (ref 70–99)

## 2024-01-03 LAB — POTASSIUM (ARMC VASCULAR LAB ONLY): Potassium (ARMC vascular lab): 4.4 mmol/L (ref 3.5–5.1)

## 2024-01-03 SURGERY — LOWER EXTREMITY ANGIOGRAPHY
Anesthesia: Moderate Sedation | Site: Leg Lower | Laterality: Left

## 2024-01-03 MED ORDER — FENTANYL CITRATE (PF) 100 MCG/2ML IJ SOLN
INTRAMUSCULAR | Status: DC | PRN
  Administered 2024-01-03: 50 ug via INTRAVENOUS
  Administered 2024-01-03: 25 ug via INTRAVENOUS
  Administered 2024-01-03 (×2): 50 ug via INTRAVENOUS
  Administered 2024-01-03 (×3): 25 ug via INTRAVENOUS

## 2024-01-03 MED ORDER — IODIXANOL 320 MG/ML IV SOLN
INTRAVENOUS | Status: DC | PRN
  Administered 2024-01-03: 50 mL

## 2024-01-03 MED ORDER — SODIUM CHLORIDE 0.9 % IV SOLN: INTRAVENOUS | Status: DC

## 2024-01-03 MED ORDER — SODIUM CHLORIDE 0.9% FLUSH: 3.0000 mL | INTRAVENOUS | Status: DC | PRN

## 2024-01-03 MED ORDER — VANCOMYCIN HCL IN DEXTROSE 1-5 GM/200ML-% IV SOLN
INTRAVENOUS | Status: AC
  Filled 2024-01-03: qty 200

## 2024-01-03 MED ORDER — MIDAZOLAM HCL 2 MG/2ML IJ SOLN
INTRAMUSCULAR | Status: AC
  Filled 2024-01-03: qty 2

## 2024-01-03 MED ORDER — HYDROMORPHONE HCL 1 MG/ML IJ SOLN: 1.0000 mg | Freq: Once | INTRAMUSCULAR | Status: DC | PRN

## 2024-01-03 MED ORDER — SODIUM CHLORIDE 0.9 % IV SOLN: 250.0000 mL | INTRAVENOUS | Status: DC | PRN

## 2024-01-03 MED ORDER — SODIUM CHLORIDE 0.9% FLUSH: 3.0000 mL | Freq: Two times a day (BID) | INTRAVENOUS | Status: DC

## 2024-01-03 MED ORDER — HEPARIN (PORCINE) IN NACL 1000-0.9 UT/500ML-% IV SOLN
INTRAVENOUS | Status: DC | PRN
  Administered 2024-01-03: 1000 mL

## 2024-01-03 MED ORDER — HYDRALAZINE HCL 20 MG/ML IJ SOLN: 5.0000 mg | INTRAMUSCULAR | Status: DC | PRN

## 2024-01-03 MED ORDER — HEPARIN SODIUM (PORCINE) 1000 UNIT/ML IJ SOLN
INTRAMUSCULAR | Status: DC | PRN
  Administered 2024-01-03: 5000 [IU] via INTRAVENOUS

## 2024-01-03 MED ORDER — VANCOMYCIN HCL IN DEXTROSE 1-5 GM/200ML-% IV SOLN
1000.0000 mg | Freq: Once | INTRAVENOUS | Status: AC
  Administered 2024-01-03: 1000 mg via INTRAVENOUS

## 2024-01-03 MED ORDER — MIDAZOLAM HCL 2 MG/ML PO SYRP: 8.0000 mg | ORAL_SOLUTION | Freq: Once | ORAL | Status: DC | PRN

## 2024-01-03 MED ORDER — LABETALOL HCL 5 MG/ML IV SOLN: 10.0000 mg | INTRAVENOUS | Status: DC | PRN

## 2024-01-03 MED ORDER — METHYLPREDNISOLONE SODIUM SUCC 125 MG IJ SOLR: 125.0000 mg | Freq: Once | INTRAMUSCULAR | Status: DC | PRN

## 2024-01-03 MED ORDER — DIPHENHYDRAMINE HCL 50 MG/ML IJ SOLN: 50.0000 mg | Freq: Once | INTRAMUSCULAR | Status: DC | PRN

## 2024-01-03 MED ORDER — HEPARIN SODIUM (PORCINE) 1000 UNIT/ML IJ SOLN
INTRAMUSCULAR | Status: AC
  Filled 2024-01-03: qty 10

## 2024-01-03 MED ORDER — ACETAMINOPHEN 325 MG PO TABS: 650.0000 mg | ORAL_TABLET | ORAL | Status: DC | PRN

## 2024-01-03 MED ORDER — FENTANYL CITRATE PF 50 MCG/ML IJ SOSY
PREFILLED_SYRINGE | INTRAMUSCULAR | Status: AC
  Filled 2024-01-03: qty 1

## 2024-01-03 MED ORDER — ONDANSETRON HCL 4 MG/2ML IJ SOLN
INTRAMUSCULAR | Status: AC
  Filled 2024-01-03: qty 2

## 2024-01-03 MED ORDER — LIDOCAINE-EPINEPHRINE (PF) 1 %-1:200000 IJ SOLN
INTRAMUSCULAR | Status: DC | PRN
  Administered 2024-01-03: 10 mL

## 2024-01-03 MED ORDER — MIDAZOLAM HCL 2 MG/2ML IJ SOLN
INTRAMUSCULAR | Status: AC
  Filled 2024-01-03: qty 4

## 2024-01-03 MED ORDER — MIDAZOLAM HCL 2 MG/2ML IJ SOLN
INTRAMUSCULAR | Status: DC | PRN
  Administered 2024-01-03: 2 mg via INTRAVENOUS
  Administered 2024-01-03 (×6): 1 mg via INTRAVENOUS

## 2024-01-03 MED ORDER — CEFAZOLIN SODIUM-DEXTROSE 1-4 GM/50ML-% IV SOLN: 1.0000 g | INTRAVENOUS | Status: DC

## 2024-01-03 MED ORDER — ONDANSETRON HCL 4 MG/2ML IJ SOLN
4.0000 mg | Freq: Four times a day (QID) | INTRAMUSCULAR | Status: DC | PRN
  Administered 2024-01-03 (×2): 4 mg via INTRAVENOUS

## 2024-01-03 MED ORDER — FENTANYL CITRATE (PF) 100 MCG/2ML IJ SOLN
INTRAMUSCULAR | Status: AC
  Filled 2024-01-03: qty 2

## 2024-01-03 MED ORDER — FAMOTIDINE 20 MG PO TABS
40.0000 mg | ORAL_TABLET | Freq: Once | ORAL | Status: DC | PRN
Start: 2024-01-03 — End: 2024-01-03

## 2024-01-03 SURGICAL SUPPLY — 24 items
BALLN ARMADA 1.5X120X150 (BALLOONS) ×1 IMPLANT
BALLN ARMADA 2X100X150 (BALLOONS) ×1 IMPLANT
BALLN LUTONIX 018 5X220X130 (BALLOONS) ×1 IMPLANT
BALLN MINITREK RX 1.5X15 (BALLOONS) ×1 IMPLANT
BALLOON ARMADA 1.5X120X150 (BALLOONS) IMPLANT
BALLOON ARMADA 2X100X150 (BALLOONS) IMPLANT
BALLOON LUTONIX 018 5X220X130 (BALLOONS) IMPLANT
BALLOON MINITREK RX 1.5X15 (BALLOONS) IMPLANT
CATH ANGIO 5F PIGTAIL 65CM (CATHETERS) IMPLANT
CATH AURYON ATHERECTOMY 0.9 (CATHETERS) IMPLANT
CATH CXI SUPP 2.6F 150 ANG (CATHETERS) IMPLANT
CATH CXI SUPP ST 4FR 135CM (CATHETERS) IMPLANT
CATH SEEKER .018X150 (CATHETERS) IMPLANT
CATH SEEKER 014X150 (CATHETERS) IMPLANT
COVER PROBE ULTRASOUND 5X96 (MISCELLANEOUS) IMPLANT
DEVICE PRESTO INFLATION (MISCELLANEOUS) IMPLANT
DEVICE STARCLOSE SE CLOSURE (Vascular Products) IMPLANT
GLIDEWIRE ADV .035X260CM (WIRE) IMPLANT
PACK ANGIOGRAPHY (CUSTOM PROCEDURE TRAY) ×1 IMPLANT
SHEATH BRITE TIP 5FRX11 (SHEATH) IMPLANT
SHEATH FLEXOR ANSEL2 7FRX45 (SHEATH) IMPLANT
TUBING CONTRAST HIGH PRESS 72 (TUBING) IMPLANT
WIRE COMMAND ST 018 300CM (WIRE) IMPLANT
WIRE J 3MM .035X145CM (WIRE) IMPLANT

## 2024-01-03 NOTE — Interval H&P Note (Signed)
 History and Physical Interval Note:  01/03/2024 10:37 AM  Deanna [name]  has presented today for surgery, with the diagnosis of LLE Angio    ASO w rest pain [place].  The various methods of treatment have been discussed with the patient and family. After consideration of risks, benefits and other options for treatment, the patient has consented to  Procedure(s): Lower Extremity Angiography (Left) as a surgical intervention.  The patient's history has been reviewed, patient examined, no change in status, stable for surgery.  I have reviewed the patient's chart and labs.  Questions were answered to the patient's satisfaction.     Festus Barren

## 2024-01-03 NOTE — Op Note (Signed)
 [facility]  Percutaneous Study/Intervention Procedural Note   Date of Surgery: 01/03/2024  Surgeon(s):Margrette Wynia    Assistants:none  Pre-operative Diagnosis: PAD with rest pain LLE  Post-operative diagnosis:  Same  Procedure(s) Performed:             1.  Ultrasound guidance for vascular access right femoral artery             2.  Catheter placement into left common femoral artery from right femoral approach             3.  Aortogram and selective left lower extremity angiogram             4.  Percutaneous transluminal angioplasty of left anterior tibial artery with 1-1/2 and 2 mm diameter angioplasty balloons             5.  Percutaneous transluminal angioplasty of left popliteal artery and distal SFA with 5 mm diameter by 22 cm length Lutonix drug-coated angioplasty balloon  6.  Laser atherectomy of left anterior tibial artery with the Auryon laser 0.9 catheter             7.  StarClose closure device right femoral artery  EBL: 10 cc  Contrast: 50 cc  Fluoro Time: 10.6 minutes  Moderate Conscious Sedation Time: approximately 74 minutes using 8 mg of Versed and 250 mcg of Fentanyl              Indications:  Patient is a 73 y.o.female with a history of peripheral arterial disease who is already undergone right lower extremity revascularization.  She is having left leg pain that has some component of that is concerning for ischemic rest pain as well as neuropathy and musculoskeletal pain. The patient is brought in for angiography for further evaluation and potential treatment.  Due to the limb threatening nature of the situation, angiogram was performed for attempted limb salvage. The patient is aware that if the procedure fails, amputation would be expected.  The patient also understands that even with successful revascularization, amputation may still be required due to the severity of the situation.  Risks and benefits are discussed and informed consent is  obtained.   Procedure:  The patient was identified and appropriate procedural time out was performed.  The patient was then placed supine on the table and prepped and draped in the usual sterile fashion. Moderate conscious sedation was administered during a face to face encounter with the patient throughout the procedure with my supervision of the RN administering medicines and monitoring the patient's vital signs, pulse oximetry, telemetry and mental status throughout from the start of the procedure until the patient was taken to the recovery room. Ultrasound was used to evaluate the right common femoral artery.  It was patent .  A digital ultrasound image was acquired.  A Seldinger needle was used to access the right common femoral artery under direct ultrasound guidance and a permanent image was performed.  A 0.035 J wire was advanced without resistance and a 5Fr sheath was placed.  Pigtail catheter was placed into the aorta and an AP aortogram was performed. This demonstrated normal renal arteries and normal aorta and iliac segments without significant stenosis. I then crossed the aortic bifurcation and advanced to the left femoral head. Selective left lower extremity angiogram was then performed. This demonstrated fairly normal left common femoral artery and proximal to mid superficial femoral artery.  The distal SFA had a focal high-grade stenosis of about 85  to 90%.  There were then 2 areas of stenosis in the proximal and proximal to mid popliteal artery in the 60 to 70% range.  The tibioperoneal trunk and peroneal artery were the dominant runoff distally and did not have the obvious focal stenosis.  The anterior tibial artery was proximal in the first 6 to 8 cm but then occluded and reconstituted just above the ankle.  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 It was felt that it was in the patient's best interest to proceed with intervention after these images to  avoid a second procedure and a larger amount of contrast and fluoroscopy based off of the findings from the initial angiogram. The patient was systemically heparinized and a 6 [place] Ansell sheath was then placed over the [employer] wire. I then used a Kumpe catheter and the advantage wire to easily navigate through the SFA and popliteal disease.  I then got into the anterior tibial artery and selective imaging of the anterior tibial artery was performed.  I then exchanged for a 0.018 command wire and actually cross the occlusion fairly easily with the command wire parking the wire in the foot.  Due to the dense calcific nature of the lesion, I could not get even a small caliber seeker or CXI catheter to cross the most calcific part of the anterior tibial artery in the midsegment.  I then performed angioplasty with a 2 mm diameter by 10 cm length angioplasty balloon in the proximal and proximal to mid anterior tibial artery inflating this to 14 atm.  A 5 mm diameter by 22 cm length Lutonix drug-coated angioplasty balloon was then inflated to 10 atm for 1 minute to treat the SFA and popliteal disease.  The SFA and popliteal had less than 20% residual stenosis after angioplasty and did not require stent placement.  I was still unable to cross the calcific portion of the mid anterior tibial artery with any balloons to treat more distally.  I exchanged for a 0.014 command wire and try to 1.5 mm coronary balloon and inflated right at the calcific lesion in the mid anterior tibial artery but could still not get anything to cross this lesion initially.  Laser atherectomy was then performed using the Auryon 0.9 catheter.  Laser atherectomy was performed focally in the midsegment of the left anterior tibial artery and the most calcific portion and several minutes were spent trying to atherectomized this lesion.  It still would not cross more distally.  Following this, a 1.5 mm diameter by 12 cm length 0.014 balloon was  selected but this still would not cross the lesion.  At this point, I really had no other options for trying to revascularize this long anterior tibial artery occlusion. I elected to terminate the procedure. The sheath was removed and StarClose closure device was deployed in the right femoral artery with excellent hemostatic result. The patient was taken to the recovery room in stable condition having tolerated the procedure well.  Findings:               Aortogram:  This demonstrated normal renal arteries and normal aorta and iliac segments without significant stenosis.             Left Lower Extremity:  This demonstrated fairly normal left common femoral artery and proximal to mid superficial femoral artery.  The distal SFA had a focal high-grade stenosis of about 85 to 90%.  There were then 2 areas of stenosis in  the proximal and proximal to mid popliteal artery in the 60 to 70% range.  The tibioperoneal trunk and peroneal artery were the dominant runoff distally and did not have the obvious focal stenosis.  The anterior tibial artery was proximal in the first 6 to 8 cm but then occluded and reconstituted just above the ankle.  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   Disposition: Patient was taken to the recovery room in stable condition having tolerated the procedure well.  Complications: None  Festus Barren 01/03/2024 1:39 PM   This note was created with Dragon Medical transcription system. Any errors in dictation are purely unintentional.

## 2024-01-04 ENCOUNTER — Encounter: Payer: Self-pay | Admitting: Vascular Surgery

## 2024-01-08 ENCOUNTER — Telehealth: Payer: Self-pay | Admitting: Physician Assistant

## 2024-01-08 ENCOUNTER — Encounter: Admitting: Neurology

## 2024-01-08 NOTE — Telephone Encounter (Signed)
 Patient is calling to let our office know that her left hand has been numb since last Tuesday and since she went to dialysis and has been unable to use her left hand since then. She states that the dialysis office thinks she may have had another stroke while she was there. She finally states that she was unable to go to her appointment at [facility] this morning as the facility she is at did not tell her in time. Please advise.

## 2024-01-09 NOTE — Telephone Encounter (Signed)
 3rd attempt to reach Deanna [name].   I made to attempts to call her cell number which has no inbox set up.  Another call was placed to [facility] to discuss with Staff. Phone rung out at nurses station with no option to leave a message.

## 2024-01-09 NOTE — Telephone Encounter (Signed)
 Patient phone rung to voicemail. No option to leave a message to forward Brooke's continued concern that she should go to the ER for concerns of Stroke as previously advised.

## 2024-01-16 ENCOUNTER — Other Ambulatory Visit
Admission: RE | Admit: 2024-01-16 | Discharge: 2024-01-16 | Disposition: A | Discharge status: Home / Self Care | Source: Ambulatory Visit | Attending: Nephrology | Admitting: Nephrology

## 2024-01-16 DIAGNOSIS — N186 End stage renal disease: Secondary | ICD-10-CM | POA: Insufficient documentation

## 2024-01-16 LAB — POTASSIUM: Potassium: 5.6 mmol/L — ABNORMAL HIGH (ref 3.5–5.1)

## 2024-01-16 LAB — HEMOGLOBIN: Hemoglobin: 9.7 g/dL — ABNORMAL LOW (ref 12.0–15.0)

## 2024-01-21 ENCOUNTER — Other Ambulatory Visit (INDEPENDENT_AMBULATORY_CARE_PROVIDER_SITE_OTHER): Payer: Self-pay | Admitting: Vascular Surgery

## 2024-01-21 DIAGNOSIS — N186 End stage renal disease: Secondary | ICD-10-CM

## 2024-01-21 DIAGNOSIS — I739 Peripheral vascular disease, unspecified: Secondary | ICD-10-CM

## 2024-01-22 ENCOUNTER — Ambulatory Visit (INDEPENDENT_AMBULATORY_CARE_PROVIDER_SITE_OTHER)

## 2024-01-22 ENCOUNTER — Ambulatory Visit (INDEPENDENT_AMBULATORY_CARE_PROVIDER_SITE_OTHER): Admitting: Nurse Practitioner

## 2024-01-22 ENCOUNTER — Encounter (INDEPENDENT_AMBULATORY_CARE_PROVIDER_SITE_OTHER): Payer: Self-pay | Admitting: Nurse Practitioner

## 2024-01-22 VITALS — BP 115/61 | HR 76 | Resp 16 | Wt 189.0 lb

## 2024-01-22 DIAGNOSIS — N186 End stage renal disease: Secondary | ICD-10-CM | POA: Diagnosis not present

## 2024-01-22 DIAGNOSIS — M79605 Pain in left leg: Secondary | ICD-10-CM | POA: Diagnosis not present

## 2024-01-22 DIAGNOSIS — I739 Peripheral vascular disease, unspecified: Secondary | ICD-10-CM

## 2024-01-22 DIAGNOSIS — Z9889 Other specified postprocedural states: Secondary | ICD-10-CM

## 2024-01-22 DIAGNOSIS — E119 Type 2 diabetes mellitus without complications: Secondary | ICD-10-CM | POA: Diagnosis not present

## 2024-01-22 NOTE — Progress Notes (Signed)
 Subjective:    Patient ID: Deanna [name], female    DOB: [birth year], 73 y.o.   MRN: [id number] Chief Complaint  Patient presents with   Follow-up    3 week with HDA and ABI follow up    The patient returns today for follow-up noninvasive studies after recent revascularization of the left lower extremity.  This was done because she noted pain in her left lower extremity.  She noted that she had pain shooting down her left leg starting in her groin area.  It happens whenever she stands.  She denies classic claudication-like symptoms.  She also denies any rest pain such as when she is seated.  Currently there are no open wounds or ulcerations on the lower extremities.  She also noted that she has been having some difficulty at dialysis.  She has been having episodes of clotting during dialysis as well as they are having difficulties with her run times.  Today the patient's fistula has a flow volume of 14 which is slightly decreased from previous flow volume of 1011.  She has a right ABI of 0.67 and a left of 1.09.  Her TBI on the right is notably improved at 0.81.  Her left is also improved at 1.03.  Previous ABI on the right was 1.16 and the left was 0.92 but there were diminished TBI's of 0.35 on the right and 0.47 on the left.  She has multiphasic waveforms in the left with good toe waveforms    Review of Systems  Musculoskeletal:  Positive for arthralgias.  All other systems reviewed and are negative.      Objective:   Physical Exam Vitals reviewed.  HENT:     Head: Normocephalic.  Cardiovascular:     Rate and Rhythm: Normal rate.     Pulses:          Dorsalis pedis pulses are detected w/ Doppler on the right side and detected w/ Doppler on the left side.       Posterior tibial pulses are detected w/ Doppler on the right side and detected w/ Doppler on the left side.     Arteriovenous access: Right arteriovenous access is present.    Comments: Soft thrill and bruit Pulmonary:      Effort: Pulmonary effort is normal.  Skin:    General: Skin is warm and dry.  Neurological:     Mental Status: She is alert and oriented to person, place, and time.     Gait: Gait abnormal.  Psychiatric:        Mood and Affect: Mood normal.        Behavior: Behavior normal.        Thought Content: Thought content normal.        Judgment: Judgment normal.     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   Anemia    C3 cervical fracture (HCC) 04/2023   Cervical stress fracture    CHF (congestive heart failure) (HCC)    combined systolic and diastolic   Coronary artery disease    ESRD on dialysis (HCC)    M-W-Fr   GERD (gastroesophageal reflux disease)    Hyperlipidemia    Hypertension    Pneumonia    Secondary hyperparathyroidism of renal origin (HCC)    Stroke (HCC)  with right sided weakness   Type 2 diabetes mellitus with diabetic neuropathy (HCC)    Ulnar neuropathy at elbow of right upper extremity     Social History   Socioeconomic History   Marital status: Single    Spouse name: Not on file   Number of children: 3   Years of education: Not on file   Highest education level: Not on file  Occupational History   Not on file  Tobacco Use   Smoking status: Never   Smokeless tobacco: Never  Vaping Use   Vaping status: Never Used  Substance and Sexual Activity   Alcohol use: No   Drug use: No   Sexual activity: Not Currently  Other Topics Concern   Not on file  Social History Narrative   Temporarily staying  at [facility].   Social Drivers of Corporate investment banker Strain: Not on file  Food Insecurity: No Food Insecurity (08/04/2023)   Hunger Vital Sign    Worried About Running Out of Food in the Last Year: Never true    Ran Out of Food in the Last Year: Never true  Transportation Needs: No  Transportation Needs (08/04/2023)   PRAPARE - Administrator, Civil Service (Medical): No    Lack of Transportation (Non-Medical): No  Physical Activity: Not on file  Stress: Not on file  Social Connections: Not on file  Intimate Partner Violence: Not At Risk (08/04/2023)   Humiliation, Afraid, Rape, and Kick questionnaire    Fear of Current or Ex-Partner: No    Emotionally Abused: No    Physically Abused: No    Sexually Abused: No    Past Surgical History:  Procedure Laterality Date   A/V FISTULAGRAM Right 06/28/2023   Procedure: A/V Fistulagram;  Surgeon: Celso College, MD;  Location: [facility];  Service: Cardiovascular;  Laterality: Right;   AV FISTULA PLACEMENT Right 05/17/2023   Procedure: INSERTION OF ARTERIOVENOUS (AV) GORE-TEX GRAFT ARM;  Surgeon: Celso College, MD;  Location: [facility];  Service: Vascular;  Laterality: Right;   CARDIAC CATHETERIZATION Left 03/01/2016   Procedure: Left Heart Cath and Coronary Angiography;  Surgeon: Ronney Cola, MD;  Location: [facility];  Service: Cardiovascular;  Laterality: Left;   CESAREAN SECTION     DIALYSIS/PERMA CATHETER INSERTION Right 01/10/2022   Procedure: DIALYSIS/PERMA CATHETER INSERTION;  Surgeon: Lesta Rater, MD;  Location: [facility];  Service: Cardiovascular;  Laterality: Right;   DIALYSIS/PERMA CATHETER INSERTION N/A 04/12/2022   Procedure: DIALYSIS/PERMA CATHETER INSERTION;  Surgeon: Celso College, MD;  Location: [facility];  Service: Cardiovascular;  Laterality: N/A;   DIALYSIS/PERMA CATHETER REMOVAL N/A 09/03/2023   Procedure: DIALYSIS/PERMA CATHETER REMOVAL;  Surgeon: Celso College, MD;  Location: [facility];  Service: Cardiovascular;  Laterality: N/A;   INTUBATION-ENDOTRACHEAL WITH TRACHEOSTOMY STANDBY Left 01/14/2022   Procedure: INTUBATION-NASAL WITH TRACHEOSTOMY STANDBY;  Surgeon: Lesly Raspberry, MD;  Location: [facility];  Service: ENT;  Laterality: Left;    LOOP RECORDER INSERTION Left 08/08/2023   Procedure: LOOP RECORDER INSERTION;  Surgeon: Boyce Byes, MD;  Location: [facility];  Service: Cardiovascular;  Laterality: Left;   LOWER EXTREMITY ANGIOGRAPHY Right 07/23/2023   Procedure: Lower Extremity Angiography;  Surgeon: Celso College, MD;  Location: [facility];  Service: Cardiovascular;  Laterality: Right;   LOWER EXTREMITY ANGIOGRAPHY Left 01/03/2024   Procedure: Lower Extremity Angiography;  Surgeon: Mikki Alexander  S, MD;  Location: [facility];  Service: Cardiovascular;  Laterality: Left;   NERVE TRANSFER Right 11/08/2023   Procedure: ANTERIOR INTEROSSEOUS NERVE TO ULNAR NERVE TRANSFER, END TO SIDE;  Surgeon: Carroll Clamp, MD;  Location: [facility];  Service: Neurosurgery;  Laterality: Right;   TEMPORARY DIALYSIS CATHETER N/A 01/04/2022   Procedure: TEMPORARY DIALYSIS CATHETER;  Surgeon: Celso College, MD;  Location: [facility];  Service: Cardiovascular;  Laterality: N/A;   ULNAR NERVE DECOMPRESSION Right 11/08/2023   Procedure: RIGHT ULNAR NERVE DECOMPRESSION;  Surgeon: Carroll Clamp, MD;  Location: [facility];  Service: Neurosurgery;  Laterality: Right;    Family History  Problem Relation Age of Onset   Osteoarthritis Brother     Allergies  Allergen Reactions   Ace Inhibitors Swelling   Entresto  [Sacubitril -Valsartan ] Swelling    Patient said she took both lisinopril and Entresto  so it is not clear which one she actually reacted to.  For safety reasons, she has been advised to avoid both ACE inhibitors and Entresto .   Lisinopril Swelling    Severe Angioedema (requiring Nasotracheal intubation)   Peanut-Containing Drug Products    Penicillins Rash   Tomato Rash and Other (See Comments)       Latest Ref Rng & Units 01/16/2024    5:59 AM 11/08/2023    6:49 AM 10/11/2023    2:52 PM  CBC  WBC 4.0 - 10.5 K/uL   7.7   Hemoglobin 12.0 - 15.0 g/dL 9.7  82.9  56.2   Hematocrit 36.0 - 46.0 %   41.0  33.9   Platelets 150 - 400 K/uL   246       CMP     Component Value Date/Time   NA 138 11/08/2023 0649   K 5.6 (H) 01/16/2024 0559   CL 98 11/08/2023 0649   CO2 26 10/11/2023 1452   GLUCOSE 112 (H) 11/08/2023 0649   BUN 34 (H) 11/08/2023 0649   CREATININE 4.80 (H) 11/08/2023 0649   CALCIUM  8.5 (L) 10/11/2023 1452   PROT 7.9 10/11/2023 1452   ALBUMIN  3.7 10/11/2023 1452   AST 19 10/11/2023 1452   ALT 21 10/11/2023 1452   ALKPHOS 130 (H) 10/11/2023 1452   BILITOT 0.5 10/11/2023 1452   GFRNONAA 9 (L) 10/11/2023 1452     VAS US  ABI WITH/WO TBI Result Date: 12/24/2023  LOWER EXTREMITY DOPPLER STUDY Patient Name:  TENECIA EDMUNDSON  Date of Exam:   12/20/2023 Medical Rec #: [id number]         Accession #:    [id number] Date of Birth: Dec 18, 1950         Patient Gender: F Patient Age:   67 years Exam Location:  [facility] Procedure:      VAS US  ABI WITH/WO TBI Referring Phys: Reymundo Caulk DEW --------------------------------------------------------------------------------  Indications: Peripheral artery disease. High Risk Factors: Hypertension, Diabetes, no history of smoking, coronary                    artery disease.  Vascular Interventions: 06/28/2023: RT Arm Shuntogram. PTA of the Arterial                         Anastomosis with 5 mm diameter Lutonix drug coated                         angioplasty. PTA of the Venous Anstomosis with 5 mm  dismeter Lutonix drug coated angioplasty balloon. Stent                         placement to the Venous anstomosis with 6 mm diameter cm                         length Viabahn stent.                          07/23/2023: Aortogram and Selective Rt Lower Extremity                         Angiogram. PTA of the Right Peroneal Artery and                         Tibioperoneal trunk with 3 mm diameter by 10 cm length                         angioplasty balloon. PTA of the Right Distal SFA and                         Proximal  Popliteal Artery with 6 mm diameter by 10 cm                         length Lutonix drug coated angioplasty balloon. Performing Technologist: Oneta Bilberry RVT  Examination Guidelines: A complete evaluation includes at minimum, Doppler waveform signals and systolic blood pressure reading at the level of bilateral brachial, anterior tibial, and posterior tibial arteries, when vessel segments are accessible. Bilateral testing is considered an integral part of a complete examination. Photoelectric Plethysmograph (PPG) waveforms and toe systolic pressure readings are included as required and additional duplex testing as needed. Limited examinations for reoccurring indications may be performed as noted.  ABI Findings: +---------+------------------+-----+----------+--------+ Right    Rt Pressure (mmHg)IndexWaveform  Comment  +---------+------------------+-----+----------+--------+ Brachial                                  AVGG     +---------+------------------+-----+----------+--------+ PTA      142               1.10 monophasic         +---------+------------------+-----+----------+--------+ DP       149               1.16 monophasic         +---------+------------------+-----+----------+--------+ Great Toe45                0.35                    +---------+------------------+-----+----------+--------+ +---------+------------------+-----+----------+-------+ Left     Lt Pressure (mmHg)IndexWaveform  Comment +---------+------------------+-----+----------+-------+ Brachial 129                                      +---------+------------------+-----+----------+-------+ PTA      69                0.53 monophasic        +---------+------------------+-----+----------+-------+ DP       119  0.92 monophasic        +---------+------------------+-----+----------+-------+ Great Toe60                0.47                    +---------+------------------+-----+----------+-------+ +-------+-----------+-----------+------------+------------+ ABI/TBIToday's ABIToday's TBIPrevious ABIPrevious TBI +-------+-----------+-----------+------------+------------+ Right  1.16       0.35       1.11        1.24         +-------+-----------+-----------+------------+------------+ Left   0.92       0.47       1.18        0.89         +-------+-----------+-----------+------------+------------+  Right ABIs appear essentially unchanged compared to prior study on 08/20/2023. Left ABIs appear decreased compared to prior study on 08/20/2023.  Summary: Right: Resting right ankle-brachial index is within normal range. The right toe-brachial index is abnormal. Although ankle brachial indices are within normal limits (0.95-1.29), arterial Doppler waveforms at the ankle suggest some component of arterial occlusive disease. Left: Resting left ankle-brachial index indicates mild left lower extremity arterial disease. The left toe-brachial index is abnormal. *See table(s) above for measurements and observations.  Electronically signed by Mikki Alexander MD on 12/24/2023 at 2:27:14 PM.    Final        Assessment & Plan:   1. Peripheral arterial disease with history of revascularization (HCC) (Primary) Recommend:  The patient is status post successful angiogram with intervention.  The patient reports that the claudication symptoms and leg pain has improved.   The patient denies lifestyle limiting changes at this point in time.  She does have some worse disease in her right lower extremity but currently no pain or discomfort and so we will proceed conservatively.  No further invasive studies, angiography or surgery at this time. The patient should continue walking and begin a more formal exercise program.  The patient should continue antiplatelet therapy and aggressive treatment of the lipid abnormalities  Continued surveillance is indicated as  atherosclerosis is likely to progress with time.    Patient should undergo noninvasive studies as ordered. The patient will follow up with me to review the studies.   2. ESRD (end stage renal disease) (HCC) Recommend:  The patient is experiencing increasing problems with their dialysis access.  Patient should have a fistulagram with the intention for intervention.  The intention for intervention is to restore appropriate flow and prevent thrombosis and possible loss of the access.  As well as improve the quality of dialysis therapy.  The risks, benefits and alternative therapies were reviewed in detail with the patient.  All questions were answered.  The patient agrees to proceed with angio/intervention.    The patient will follow up with me in the office after the procedure.   3. Type 2 diabetes mellitus without complication, without long-term current use of insulin  (HCC) Continue hypoglycemic medications as already ordered, these medications have been reviewed and there are no changes at this time.  Hgb A1C to be monitored as already arranged by primary service  4. Left leg pain Patient's pain shows no improvement despite recent revascularization.  Based on her description of pain I do not believe that it is vascular in nature.  I feel that is more likely musculoskeletal.  I actually suspect is more of sciatica and it may be part of the reason why she continues to have multiple falls.  She is advised to follow-up with  her primary care for further workup and evaluation.   Current Outpatient Medications on File Prior to Visit  Medication Sig Dispense Refill   ammonium lactate (AMLACTIN) 12 % cream Apply topically.     aspirin  EC 81 MG tablet Take 81 mg by mouth daily.     atorvastatin  (LIPITOR) 40 MG tablet Take 40 mg by mouth daily.     calcium  acetate (PHOSLO) 667 MG capsule Take 1,334 mg by mouth 3 (three) times daily.     clopidogrel  (PLAVIX ) 75 MG tablet Take 75 mg by mouth at  bedtime.     metoprolol  tartrate (LOPRESSOR ) 25 MG tablet Take 1 tablet (25 mg total) by mouth 2 (two) times daily. 60 tablet 0   ondansetron  (ZOFRAN -ODT) 4 MG disintegrating tablet      oxyCODONE -acetaminophen  (PERCOCET) 5-325 MG tablet Take 1 tablet by mouth every 4 (four) hours as needed for severe pain (pain score 7-10). 5 tablet 0   pregabalin (LYRICA) 25 MG capsule Take 25 mg by mouth at bedtime.     Vitamin D, Ergocalciferol, (DRISDOL) 1.25 MG (50000 UNIT) CAPS capsule Take 50,000 Units by mouth once a week. Wednesday     ferrous sulfate  325 (65 FE) MG EC tablet Take 325 mg by mouth in the morning. (Patient not taking: Reported on 01/03/2024)     gabapentin  (NEURONTIN ) 100 MG capsule Take 100 mg by mouth 2 (two) times daily as needed. (Patient not taking: Reported on 01/03/2024)     sevelamer  (RENAGEL ) 800 MG tablet Take 1,600 mg by mouth 3 (three) times daily with meals. (Patient not taking: Reported on 01/03/2024)     No current facility-administered medications on file prior to visit.    There are no Patient Instructions on file for this visit. No follow-ups on file.   Sascha Palma E Tzipporah Nagorski, NP

## 2024-01-22 NOTE — H&P (View-Only) (Signed)
 Subjective:    Patient ID: Deanna [name], female    DOB: [birth year], 73 y.o.   MRN: [id number] Chief Complaint  Patient presents with   Follow-up    3 week with HDA and ABI follow up    The patient returns today for follow-up noninvasive studies after recent revascularization of the left lower extremity.  This was done because she noted pain in her left lower extremity.  She noted that she had pain shooting down her left leg starting in her groin area.  It happens whenever she stands.  She denies classic claudication-like symptoms.  She also denies any rest pain such as when she is seated.  Currently there are no open wounds or ulcerations on the lower extremities.  She also noted that she has been having some difficulty at dialysis.  She has been having episodes of clotting during dialysis as well as they are having difficulties with her run times.  Today the patient's fistula has a flow volume of 14 which is slightly decreased from previous flow volume of 1011.  She has a right ABI of 0.67 and a left of 1.09.  Her TBI on the right is notably improved at 0.81.  Her left is also improved at 1.03.  Previous ABI on the right was 1.16 and the left was 0.92 but there were diminished TBI's of 0.35 on the right and 0.47 on the left.  She has multiphasic waveforms in the left with good toe waveforms    Review of Systems  Musculoskeletal:  Positive for arthralgias.  All other systems reviewed and are negative.      Objective:   Physical Exam Vitals reviewed.  HENT:     Head: Normocephalic.  Cardiovascular:     Rate and Rhythm: Normal rate.     Pulses:          Dorsalis pedis pulses are detected w/ Doppler on the right side and detected w/ Doppler on the left side.       Posterior tibial pulses are detected w/ Doppler on the right side and detected w/ Doppler on the left side.     Arteriovenous access: Right arteriovenous access is present.    Comments: Soft thrill and bruit Pulmonary:      Effort: Pulmonary effort is normal.  Skin:    General: Skin is warm and dry.  Neurological:     Mental Status: She is alert and oriented to person, place, and time.     Gait: Gait abnormal.  Psychiatric:        Mood and Affect: Mood normal.        Behavior: Behavior normal.        Thought Content: Thought content normal.        Judgment: Judgment normal.     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   Anemia    C3 cervical fracture (HCC) 04/2023   Cervical stress fracture    CHF (congestive heart failure) (HCC)    combined systolic and diastolic   Coronary artery disease    ESRD on dialysis (HCC)    M-W-Fr   GERD (gastroesophageal reflux disease)    Hyperlipidemia    Hypertension    Pneumonia    Secondary hyperparathyroidism of renal origin (HCC)    Stroke (HCC)  with right sided weakness   Type 2 diabetes mellitus with diabetic neuropathy (HCC)    Ulnar neuropathy at elbow of right upper extremity     Social History   Socioeconomic History   Marital status: Single    Spouse name: Not on file   Number of children: 3   Years of education: Not on file   Highest education level: Not on file  Occupational History   Not on file  Tobacco Use   Smoking status: Never   Smokeless tobacco: Never  Vaping Use   Vaping status: Never Used  Substance and Sexual Activity   Alcohol use: No   Drug use: No   Sexual activity: Not Currently  Other Topics Concern   Not on file  Social History Narrative   Temporarily staying  at [facility].   Social Drivers of Corporate investment banker Strain: Not on file  Food Insecurity: No Food Insecurity (08/04/2023)   Hunger Vital Sign    Worried About Running Out of Food in the Last Year: Never true    Ran Out of Food in the Last Year: Never true  Transportation Needs: No  Transportation Needs (08/04/2023)   PRAPARE - Administrator, Civil Service (Medical): No    Lack of Transportation (Non-Medical): No  Physical Activity: Not on file  Stress: Not on file  Social Connections: Not on file  Intimate Partner Violence: Not At Risk (08/04/2023)   Humiliation, Afraid, Rape, and Kick questionnaire    Fear of Current or Ex-Partner: No    Emotionally Abused: No    Physically Abused: No    Sexually Abused: No    Past Surgical History:  Procedure Laterality Date   A/V FISTULAGRAM Right 06/28/2023   Procedure: A/V Fistulagram;  Surgeon: Celso College, MD;  Location: [facility];  Service: Cardiovascular;  Laterality: Right;   AV FISTULA PLACEMENT Right 05/17/2023   Procedure: INSERTION OF ARTERIOVENOUS (AV) GORE-TEX GRAFT ARM;  Surgeon: Celso College, MD;  Location: [facility];  Service: Vascular;  Laterality: Right;   CARDIAC CATHETERIZATION Left 03/01/2016   Procedure: Left Heart Cath and Coronary Angiography;  Surgeon: Ronney Cola, MD;  Location: [facility];  Service: Cardiovascular;  Laterality: Left;   CESAREAN SECTION     DIALYSIS/PERMA CATHETER INSERTION Right 01/10/2022   Procedure: DIALYSIS/PERMA CATHETER INSERTION;  Surgeon: Lesta Rater, MD;  Location: [facility];  Service: Cardiovascular;  Laterality: Right;   DIALYSIS/PERMA CATHETER INSERTION N/A 04/12/2022   Procedure: DIALYSIS/PERMA CATHETER INSERTION;  Surgeon: Celso College, MD;  Location: [facility];  Service: Cardiovascular;  Laterality: N/A;   DIALYSIS/PERMA CATHETER REMOVAL N/A 09/03/2023   Procedure: DIALYSIS/PERMA CATHETER REMOVAL;  Surgeon: Celso College, MD;  Location: [facility];  Service: Cardiovascular;  Laterality: N/A;   INTUBATION-ENDOTRACHEAL WITH TRACHEOSTOMY STANDBY Left 01/14/2022   Procedure: INTUBATION-NASAL WITH TRACHEOSTOMY STANDBY;  Surgeon: Lesly Raspberry, MD;  Location: [facility];  Service: ENT;  Laterality: Left;    LOOP RECORDER INSERTION Left 08/08/2023   Procedure: LOOP RECORDER INSERTION;  Surgeon: Boyce Byes, MD;  Location: [facility];  Service: Cardiovascular;  Laterality: Left;   LOWER EXTREMITY ANGIOGRAPHY Right 07/23/2023   Procedure: Lower Extremity Angiography;  Surgeon: Celso College, MD;  Location: [facility];  Service: Cardiovascular;  Laterality: Right;   LOWER EXTREMITY ANGIOGRAPHY Left 01/03/2024   Procedure: Lower Extremity Angiography;  Surgeon: Mikki Alexander  S, MD;  Location: [facility];  Service: Cardiovascular;  Laterality: Left;   NERVE TRANSFER Right 11/08/2023   Procedure: ANTERIOR INTEROSSEOUS NERVE TO ULNAR NERVE TRANSFER, END TO SIDE;  Surgeon: Carroll Clamp, MD;  Location: [facility];  Service: Neurosurgery;  Laterality: Right;   TEMPORARY DIALYSIS CATHETER N/A 01/04/2022   Procedure: TEMPORARY DIALYSIS CATHETER;  Surgeon: Celso College, MD;  Location: [facility];  Service: Cardiovascular;  Laterality: N/A;   ULNAR NERVE DECOMPRESSION Right 11/08/2023   Procedure: RIGHT ULNAR NERVE DECOMPRESSION;  Surgeon: Carroll Clamp, MD;  Location: [facility];  Service: Neurosurgery;  Laterality: Right;    Family History  Problem Relation Age of Onset   Osteoarthritis Brother     Allergies  Allergen Reactions   Ace Inhibitors Swelling   Entresto  [Sacubitril -Valsartan ] Swelling    Patient said she took both lisinopril and Entresto  so it is not clear which one she actually reacted to.  For safety reasons, she has been advised to avoid both ACE inhibitors and Entresto .   Lisinopril Swelling    Severe Angioedema (requiring Nasotracheal intubation)   Peanut-Containing Drug Products    Penicillins Rash   Tomato Rash and Other (See Comments)       Latest Ref Rng & Units 01/16/2024    5:59 AM 11/08/2023    6:49 AM 10/11/2023    2:52 PM  CBC  WBC 4.0 - 10.5 K/uL   7.7   Hemoglobin 12.0 - 15.0 g/dL 9.7  82.9  56.2   Hematocrit 36.0 - 46.0 %   41.0  33.9   Platelets 150 - 400 K/uL   246       CMP     Component Value Date/Time   NA 138 11/08/2023 0649   K 5.6 (H) 01/16/2024 0559   CL 98 11/08/2023 0649   CO2 26 10/11/2023 1452   GLUCOSE 112 (H) 11/08/2023 0649   BUN 34 (H) 11/08/2023 0649   CREATININE 4.80 (H) 11/08/2023 0649   CALCIUM  8.5 (L) 10/11/2023 1452   PROT 7.9 10/11/2023 1452   ALBUMIN  3.7 10/11/2023 1452   AST 19 10/11/2023 1452   ALT 21 10/11/2023 1452   ALKPHOS 130 (H) 10/11/2023 1452   BILITOT 0.5 10/11/2023 1452   GFRNONAA 9 (L) 10/11/2023 1452     VAS US  ABI WITH/WO TBI Result Date: 12/24/2023  LOWER EXTREMITY DOPPLER STUDY Patient Name:  TENECIA EDMUNDSON  Date of Exam:   12/20/2023 Medical Rec #: [id number]         Accession #:    [id number] Date of Birth: Dec 18, 1950         Patient Gender: F Patient Age:   67 years Exam Location:  [facility] Procedure:      VAS US  ABI WITH/WO TBI Referring Phys: Reymundo Caulk DEW --------------------------------------------------------------------------------  Indications: Peripheral artery disease. High Risk Factors: Hypertension, Diabetes, no history of smoking, coronary                    artery disease.  Vascular Interventions: 06/28/2023: RT Arm Shuntogram. PTA of the Arterial                         Anastomosis with 5 mm diameter Lutonix drug coated                         angioplasty. PTA of the Venous Anstomosis with 5 mm  dismeter Lutonix drug coated angioplasty balloon. Stent                         placement to the Venous anstomosis with 6 mm diameter cm                         length Viabahn stent.                          07/23/2023: Aortogram and Selective Rt Lower Extremity                         Angiogram. PTA of the Right Peroneal Artery and                         Tibioperoneal trunk with 3 mm diameter by 10 cm length                         angioplasty balloon. PTA of the Right Distal SFA and                         Proximal  Popliteal Artery with 6 mm diameter by 10 cm                         length Lutonix drug coated angioplasty balloon. Performing Technologist: Oneta Bilberry RVT  Examination Guidelines: A complete evaluation includes at minimum, Doppler waveform signals and systolic blood pressure reading at the level of bilateral brachial, anterior tibial, and posterior tibial arteries, when vessel segments are accessible. Bilateral testing is considered an integral part of a complete examination. Photoelectric Plethysmograph (PPG) waveforms and toe systolic pressure readings are included as required and additional duplex testing as needed. Limited examinations for reoccurring indications may be performed as noted.  ABI Findings: +---------+------------------+-----+----------+--------+ Right    Rt Pressure (mmHg)IndexWaveform  Comment  +---------+------------------+-----+----------+--------+ Brachial                                  AVGG     +---------+------------------+-----+----------+--------+ PTA      142               1.10 monophasic         +---------+------------------+-----+----------+--------+ DP       149               1.16 monophasic         +---------+------------------+-----+----------+--------+ Great Toe45                0.35                    +---------+------------------+-----+----------+--------+ +---------+------------------+-----+----------+-------+ Left     Lt Pressure (mmHg)IndexWaveform  Comment +---------+------------------+-----+----------+-------+ Brachial 129                                      +---------+------------------+-----+----------+-------+ PTA      69                0.53 monophasic        +---------+------------------+-----+----------+-------+ DP       119  0.92 monophasic        +---------+------------------+-----+----------+-------+ Great Toe60                0.47                    +---------+------------------+-----+----------+-------+ +-------+-----------+-----------+------------+------------+ ABI/TBIToday's ABIToday's TBIPrevious ABIPrevious TBI +-------+-----------+-----------+------------+------------+ Right  1.16       0.35       1.11        1.24         +-------+-----------+-----------+------------+------------+ Left   0.92       0.47       1.18        0.89         +-------+-----------+-----------+------------+------------+  Right ABIs appear essentially unchanged compared to prior study on 08/20/2023. Left ABIs appear decreased compared to prior study on 08/20/2023.  Summary: Right: Resting right ankle-brachial index is within normal range. The right toe-brachial index is abnormal. Although ankle brachial indices are within normal limits (0.95-1.29), arterial Doppler waveforms at the ankle suggest some component of arterial occlusive disease. Left: Resting left ankle-brachial index indicates mild left lower extremity arterial disease. The left toe-brachial index is abnormal. *See table(s) above for measurements and observations.  Electronically signed by Mikki Alexander MD on 12/24/2023 at 2:27:14 PM.    Final        Assessment & Plan:   1. Peripheral arterial disease with history of revascularization (HCC) (Primary) Recommend:  The patient is status post successful angiogram with intervention.  The patient reports that the claudication symptoms and leg pain has improved.   The patient denies lifestyle limiting changes at this point in time.  She does have some worse disease in her right lower extremity but currently no pain or discomfort and so we will proceed conservatively.  No further invasive studies, angiography or surgery at this time. The patient should continue walking and begin a more formal exercise program.  The patient should continue antiplatelet therapy and aggressive treatment of the lipid abnormalities  Continued surveillance is indicated as  atherosclerosis is likely to progress with time.    Patient should undergo noninvasive studies as ordered. The patient will follow up with me to review the studies.   2. ESRD (end stage renal disease) (HCC) Recommend:  The patient is experiencing increasing problems with their dialysis access.  Patient should have a fistulagram with the intention for intervention.  The intention for intervention is to restore appropriate flow and prevent thrombosis and possible loss of the access.  As well as improve the quality of dialysis therapy.  The risks, benefits and alternative therapies were reviewed in detail with the patient.  All questions were answered.  The patient agrees to proceed with angio/intervention.    The patient will follow up with me in the office after the procedure.   3. Type 2 diabetes mellitus without complication, without long-term current use of insulin  (HCC) Continue hypoglycemic medications as already ordered, these medications have been reviewed and there are no changes at this time.  Hgb A1C to be monitored as already arranged by primary service  4. Left leg pain Patient's pain shows no improvement despite recent revascularization.  Based on her description of pain I do not believe that it is vascular in nature.  I feel that is more likely musculoskeletal.  I actually suspect is more of sciatica and it may be part of the reason why she continues to have multiple falls.  She is advised to follow-up with  her primary care for further workup and evaluation.   Current Outpatient Medications on File Prior to Visit  Medication Sig Dispense Refill   ammonium lactate (AMLACTIN) 12 % cream Apply topically.     aspirin  EC 81 MG tablet Take 81 mg by mouth daily.     atorvastatin  (LIPITOR) 40 MG tablet Take 40 mg by mouth daily.     calcium  acetate (PHOSLO) 667 MG capsule Take 1,334 mg by mouth 3 (three) times daily.     clopidogrel  (PLAVIX ) 75 MG tablet Take 75 mg by mouth at  bedtime.     metoprolol  tartrate (LOPRESSOR ) 25 MG tablet Take 1 tablet (25 mg total) by mouth 2 (two) times daily. 60 tablet 0   ondansetron  (ZOFRAN -ODT) 4 MG disintegrating tablet      oxyCODONE -acetaminophen  (PERCOCET) 5-325 MG tablet Take 1 tablet by mouth every 4 (four) hours as needed for severe pain (pain score 7-10). 5 tablet 0   pregabalin (LYRICA) 25 MG capsule Take 25 mg by mouth at bedtime.     Vitamin D, Ergocalciferol, (DRISDOL) 1.25 MG (50000 UNIT) CAPS capsule Take 50,000 Units by mouth once a week. Wednesday     ferrous sulfate  325 (65 FE) MG EC tablet Take 325 mg by mouth in the morning. (Patient not taking: Reported on 01/03/2024)     gabapentin  (NEURONTIN ) 100 MG capsule Take 100 mg by mouth 2 (two) times daily as needed. (Patient not taking: Reported on 01/03/2024)     sevelamer  (RENAGEL ) 800 MG tablet Take 1,600 mg by mouth 3 (three) times daily with meals. (Patient not taking: Reported on 01/03/2024)     No current facility-administered medications on file prior to visit.    There are no Patient Instructions on file for this visit. No follow-ups on file.   Sascha Palma E Tzipporah Nagorski, NP

## 2024-01-23 LAB — VAS US ABI WITH/WO TBI
Left ABI: 1.09
Right ABI: 0.67

## 2024-01-28 ENCOUNTER — Telehealth (INDEPENDENT_AMBULATORY_CARE_PROVIDER_SITE_OTHER): Payer: Self-pay

## 2024-01-28 NOTE — Telephone Encounter (Addendum)
 I attempted to contact transportation at [facility] to schedule the patient for a left arm fistulagram with Dr. Vonna Guardian. A message was left for a return call. Deanna [name] called back and the patient is scheduled with Dr. Vonna Guardian on 01/31/24 with a 10:30 am arrival time to the [facility]. Pre-procedure instructions were discussed and will be faxed to [facility].

## 2024-01-31 ENCOUNTER — Encounter: Payer: Self-pay | Admitting: Vascular Surgery

## 2024-01-31 ENCOUNTER — Telehealth: Payer: Self-pay | Admitting: Neurosurgery

## 2024-01-31 ENCOUNTER — Ambulatory Visit
Admission: RE | Admit: 2024-01-31 | Discharge: 2024-01-31 | Disposition: A | Discharge status: Skilled Nursing Facility | Attending: Vascular Surgery | Admitting: Vascular Surgery

## 2024-01-31 ENCOUNTER — Encounter
Admission: RE | Disposition: A | Discharge status: Skilled Nursing Facility | Payer: Self-pay | Source: Home / Self Care | Attending: Vascular Surgery

## 2024-01-31 ENCOUNTER — Other Ambulatory Visit: Payer: Self-pay

## 2024-01-31 ENCOUNTER — Encounter: Payer: Self-pay | Admitting: Neurology

## 2024-01-31 DIAGNOSIS — I132 Hypertensive heart and chronic kidney disease with heart failure and with stage 5 chronic kidney disease, or end stage renal disease: Secondary | ICD-10-CM | POA: Diagnosis not present

## 2024-01-31 DIAGNOSIS — M79605 Pain in left leg: Secondary | ICD-10-CM | POA: Insufficient documentation

## 2024-01-31 DIAGNOSIS — N186 End stage renal disease: Secondary | ICD-10-CM | POA: Insufficient documentation

## 2024-01-31 DIAGNOSIS — T82858A Stenosis of vascular prosthetic devices, implants and grafts, initial encounter: Secondary | ICD-10-CM | POA: Insufficient documentation

## 2024-01-31 DIAGNOSIS — Z79899 Other long term (current) drug therapy: Secondary | ICD-10-CM | POA: Diagnosis not present

## 2024-01-31 DIAGNOSIS — I5040 Unspecified combined systolic (congestive) and diastolic (congestive) heart failure: Secondary | ICD-10-CM | POA: Insufficient documentation

## 2024-01-31 DIAGNOSIS — Z992 Dependence on renal dialysis: Secondary | ICD-10-CM | POA: Insufficient documentation

## 2024-01-31 DIAGNOSIS — R296 Repeated falls: Secondary | ICD-10-CM | POA: Diagnosis not present

## 2024-01-31 DIAGNOSIS — E1122 Type 2 diabetes mellitus with diabetic chronic kidney disease: Secondary | ICD-10-CM | POA: Insufficient documentation

## 2024-01-31 DIAGNOSIS — Y832 Surgical operation with anastomosis, bypass or graft as the cause of abnormal reaction of the patient, or of later complication, without mention of misadventure at the time of the procedure: Secondary | ICD-10-CM | POA: Diagnosis not present

## 2024-01-31 HISTORY — PX: A/V FISTULAGRAM: CATH118298

## 2024-01-31 LAB — POTASSIUM (ARMC VASCULAR LAB ONLY): Potassium (ARMC vascular lab): 4.6 mmol/L (ref 3.5–5.1)

## 2024-01-31 LAB — GLUCOSE, CAPILLARY: Glucose-Capillary: 76 mg/dL (ref 70–99)

## 2024-01-31 SURGERY — A/V FISTULAGRAM
Anesthesia: Moderate Sedation | Laterality: Left

## 2024-01-31 MED ORDER — FENTANYL CITRATE (PF) 100 MCG/2ML IJ SOLN
INTRAMUSCULAR | Status: DC | PRN
  Administered 2024-01-31 (×3): 25 ug via INTRAVENOUS

## 2024-01-31 MED ORDER — ONDANSETRON HCL 4 MG/2ML IJ SOLN: 4.0000 mg | Freq: Four times a day (QID) | INTRAMUSCULAR | Status: DC | PRN

## 2024-01-31 MED ORDER — MIDAZOLAM HCL 2 MG/2ML IJ SOLN
INTRAMUSCULAR | Status: AC
  Filled 2024-01-31: qty 2

## 2024-01-31 MED ORDER — MIDAZOLAM HCL 2 MG/2ML IJ SOLN
INTRAMUSCULAR | Status: DC | PRN
  Administered 2024-01-31: 2 mg via INTRAVENOUS
  Administered 2024-01-31: .5 mg via INTRAVENOUS

## 2024-01-31 MED ORDER — FENTANYL CITRATE PF 50 MCG/ML IJ SOSY
PREFILLED_SYRINGE | INTRAMUSCULAR | Status: AC
  Filled 2024-01-31: qty 1

## 2024-01-31 MED ORDER — HEPARIN (PORCINE) IN NACL 1000-0.9 UT/500ML-% IV SOLN
INTRAVENOUS | Status: DC | PRN
  Administered 2024-01-31: 500 mL

## 2024-01-31 MED ORDER — CEFAZOLIN SODIUM-DEXTROSE 1-4 GM/50ML-% IV SOLN
1.0000 g | INTRAVENOUS | Status: AC
  Administered 2024-01-31: 1 g via INTRAVENOUS

## 2024-01-31 MED ORDER — METHYLPREDNISOLONE SODIUM SUCC 125 MG IJ SOLR: 125.0000 mg | Freq: Once | INTRAMUSCULAR | Status: DC | PRN

## 2024-01-31 MED ORDER — HEPARIN SODIUM (PORCINE) 1000 UNIT/ML IJ SOLN
INTRAMUSCULAR | Status: AC
  Filled 2024-01-31: qty 10

## 2024-01-31 MED ORDER — CEFAZOLIN SODIUM-DEXTROSE 1-4 GM/50ML-% IV SOLN
INTRAVENOUS | Status: AC
  Filled 2024-01-31: qty 50

## 2024-01-31 MED ORDER — LIDOCAINE-EPINEPHRINE (PF) 1 %-1:200000 IJ SOLN
INTRAMUSCULAR | Status: DC | PRN
  Administered 2024-01-31: 10 mL

## 2024-01-31 MED ORDER — FAMOTIDINE 20 MG PO TABS: 40.0000 mg | ORAL_TABLET | Freq: Once | ORAL | Status: DC | PRN

## 2024-01-31 MED ORDER — IODIXANOL 320 MG/ML IV SOLN
INTRAVENOUS | Status: DC | PRN
  Administered 2024-01-31: 20 mL

## 2024-01-31 MED ORDER — SODIUM CHLORIDE 0.9 % IV SOLN: INTRAVENOUS | Status: DC

## 2024-01-31 MED ORDER — DIPHENHYDRAMINE HCL 50 MG/ML IJ SOLN: 50.0000 mg | Freq: Once | INTRAMUSCULAR | Status: DC | PRN

## 2024-01-31 MED ORDER — MIDAZOLAM HCL 2 MG/ML PO SYRP: 8.0000 mg | ORAL_SOLUTION | Freq: Once | ORAL | Status: DC | PRN

## 2024-01-31 MED ORDER — HYDROMORPHONE HCL 1 MG/ML IJ SOLN: 1.0000 mg | Freq: Once | INTRAMUSCULAR | Status: DC | PRN

## 2024-01-31 SURGICAL SUPPLY — 10 items
BALLOON DORADO 6X200X135 (BALLOONS) IMPLANT
BALLOON LUTONIX 7X220X130 (BALLOONS) IMPLANT
COVER PROBE ULTRASOUND 5X96 (MISCELLANEOUS) IMPLANT
DEVICE PRESTO INFLATION (MISCELLANEOUS) IMPLANT
DRAPE BRACHIAL (DRAPES) IMPLANT
KIT MICROPUNCTURE VSI 5F STIFF (SHEATH) IMPLANT
PACK ANGIOGRAPHY (CUSTOM PROCEDURE TRAY) ×1 IMPLANT
SHEATH BRITE TIP 6FRX5.5 (SHEATH) IMPLANT
SUT MNCRL AB 4-0 PS2 18 (SUTURE) IMPLANT
WIRE SUPRACORE 190CM (WIRE) IMPLANT

## 2024-01-31 NOTE — Discharge Instructions (Addendum)
 Fistulagram/Shuntogram, Care After  Refer to this sheet in the next few weeks. These instructions provide you with information on caring for yourself after your procedure. Your health care provider may also give you more specific instructions. Your treatment has been planned according to current medical practices, but problems sometimes occur. Call your health care provider if you have any problems or questions after your procedure.  What can I expect after the procedure? After your procedure, it is typical to have the following: A small amount of discomfort in the area where the catheters were placed. A small amount of bruising around the fistula. Sleepiness and fatigue.  Follow these instructions at home: Rest at home for the day following your procedure. Do not drive or operate heavy machinery while taking pain medicine. Take medicines only as directed by your health care provider. Do not take baths, swim, or use a hot tub until your health care provider approves. You may shower 24 hours after the procedure or as directed by your health care provider. There are many different ways to close and cover an incision, including stitches, skin glue, and adhesive strips. Follow your health care provider's instructions on: Incision care. Bandage (dressing) changes and removal. Incision closure removal. Monitor your dialysis fistula carefully.  Contact a health care provider if: You have drainage, redness, swelling, or pain at your catheter site. You have a fever. You have chills.  Get help right away if: You feel weak. You have trouble balancing. You have trouble moving your arms or legs. You have problems with your speech or vision. You can no longer feel a vibration or buzz when you put your fingers over your dialysis fistula. The limb that was used for the procedure: Swells. Is painful. Is cold. Is discolored, such as blue or pale white.  This information is not intended to replace  advice given to you by your health care provider. Make sure you discuss any questions you have with your health care provider. Document Released: 01/26/2014 Document Revised: 02/17/2016 Document Reviewed: 10/31/2013 Elsevier Interactive Patient Education  2018 [employer].

## 2024-01-31 NOTE — Interval H&P Note (Signed)
 History and Physical Interval Note:  01/31/2024 9:22 AM  Deanna [name]  has presented today for surgery, with the diagnosis of L arm fistulagram   End Stage Renal [place].  The various methods of treatment have been discussed with the patient and family. After consideration of risks, benefits and other options for treatment, the patient has consented to  Procedure(s): A/V Fistulagram (Left) as a surgical intervention.  The patient's history has been reviewed, patient examined, no change in status, stable for surgery.  I have reviewed the patient's chart and labs.  Questions were answered to the patient's satisfaction.     [name]

## 2024-01-31 NOTE — Op Note (Signed)
 [facility]    OPERATIVE NOTE   PROCEDURE: 1.  Right loop forearm arteriovenous graft cannulation under ultrasound guidance 2.  Right arm shuntogram 3.  Percutaneous transluminal angioplasty of right upper arm brachial vein to the axillary vein with 6 mm diameter high-pressure and 7 mm diameter Lutonix drug-coated angioplasty balloons  PRE-OPERATIVE DIAGNOSIS: 1. ESRD 2. Malfunctioning right loop forearm arteriovenous graft  POST-OPERATIVE DIAGNOSIS: same as above   SURGEON: Mikki Alexander, MD  ANESTHESIA: local with MCS  ESTIMATED BLOOD LOSS: 5 cc  FINDING(S): The loop forearm AV graft itself was patent.  There was a stent across the venous anastomosis that was patent.  The outflow brachial vein into the axillary vein had 3 areas of greater than 70% stenosis over about a 15 cm span.  The central venous circulation was then widely patent without stenosis.  SPECIMEN(S):  None  CONTRAST: 20 cc  FLUORO TIME:  1.8 minutes  MODERATE CONSCIOUS SEDATION TIME:  Approximately 20 minutes using 2.5 mg of Versed  and 75 mcg of Fentanyl   INDICATIONS: Deanna [name] is a 73 y.o. female who presents with malfunctioning right loop forearm arteriovenous graft.  The patient is scheduled for right arm shuntogram.  The patient is aware the risks include but are not limited to: bleeding, infection, thrombosis of the cannulated access, and possible anaphylactic reaction to the contrast.  The patient is aware of the risks of the procedure and elects to proceed forward.  DESCRIPTION: After full informed written consent was obtained, the patient was brought back to the angiography suite and placed supine upon the angiography table.  The patient was connected to monitoring equipment. Moderate conscious sedation was administered during a face to face encounter throughout the procedure with my supervision of the RN administering medicines and monitoring the patient's vital signs, pulse  oximetry, telemetry and mental status throughout from the start of the procedure until the patient was taken to the recovery room The right arm was prepped and draped in the standard fashion for a percutaneous access intervention.  Under ultrasound guidance, the right loop forearm arteriovenous graft was cannulated with a micropuncture needle under direct ultrasound guidance were it was patent and a permanent image was performed.  The microwire was advanced into the graft and the needle was exchanged for the a microsheath.  I then upsized to a 6 Fr Sheath and imaging was performed.  Hand injections were completed to image the access including the central venous system. This demonstrated The loop forearm AV graft itself was patent.  There was a stent across the venous anastomosis that was patent.  The outflow brachial vein into the axillary vein had 3 areas of greater than 70% stenosis over about a 15 cm span.  The central venous circulation was then widely patent without stenosis.  Based on the images, this patient will need intervention to the outflow vein to maintain patency. I then gave the patient 3000 units of intravenous heparin .  I then crossed the stenosis with a supra core wire.  Based on the imaging, a 7 mm x 22 cm Lutonix drug-coated angioplasty balloon was selected.  The balloon was centered around the stenosis and inflated to burst inflation of 14 ATM for 1 minute(s), but one of the 3 areas of narrowing never completely resolved its waist.  I then went with a 6 mm diameter by 20 cm length high-pressure angioplasty balloon and inflated this to 16 atm for 1 minute.  On completion imaging, a less than  25% residual stenosis was present in all 3 areas of narrowing.     Based on the completion imaging, no further intervention is necessary.  The wire and balloon were removed from the sheath.  A 4-0 Monocryl purse-string suture was sewn around the sheath.  The sheath was removed while tying down the  suture.  A sterile bandage was applied to the puncture site.  COMPLICATIONS: None  CONDITION: Stable   Mikki Alexander  01/31/2024 12:55 PM    This note was created with Dragon Medical transcription system. Any errors in dictation are purely unintentional.

## 2024-01-31 NOTE — Telephone Encounter (Signed)
 Patient is asking if she can get surgery on her left arm yet?

## 2024-01-31 NOTE — Telephone Encounter (Signed)
 Tried calling patient. No answer and voice mail full.

## 2024-02-01 ENCOUNTER — Ambulatory Visit (INDEPENDENT_AMBULATORY_CARE_PROVIDER_SITE_OTHER): Payer: Medicare HMO

## 2024-02-01 DIAGNOSIS — I70219 Atherosclerosis of native arteries of extremities with intermittent claudication, unspecified extremity: Secondary | ICD-10-CM

## 2024-02-01 NOTE — Telephone Encounter (Signed)
 Tried calling patient. No answer and voice mail full.

## 2024-02-04 LAB — CUP PACEART REMOTE DEVICE CHECK
Date Time Interrogation Session: 20250509172346
Implantable Pulse Generator Implant Date: 20241113

## 2024-02-04 NOTE — Progress Notes (Signed)
 Carelink Summary Report / Loop Recorder

## 2024-02-04 NOTE — Telephone Encounter (Signed)
 Tried calling patient. No answer and voice mail full.

## 2024-02-04 NOTE — Addendum Note (Signed)
 Addended by: Lott Rouleau A on: 02/04/2024 09:00 AM   Modules accepted: Orders

## 2024-02-10 ENCOUNTER — Ambulatory Visit: Payer: Self-pay | Admitting: Cardiology

## 2024-02-12 ENCOUNTER — Ambulatory Visit (INDEPENDENT_AMBULATORY_CARE_PROVIDER_SITE_OTHER): Admitting: Neurology

## 2024-02-12 DIAGNOSIS — R202 Paresthesia of skin: Secondary | ICD-10-CM

## 2024-02-12 DIAGNOSIS — G629 Polyneuropathy, unspecified: Secondary | ICD-10-CM

## 2024-02-12 DIAGNOSIS — M5412 Radiculopathy, cervical region: Secondary | ICD-10-CM

## 2024-02-12 NOTE — Procedures (Signed)
  [facility]  [address] , [address]  [place], [place] [address] Tel: [phone] Fax: [phone] Test Date:  02/12/2024  Patient: Deanna [name] DOB: 1951-07-29 Physician: Rommie Coats, MD  Sex: Female Height: 6' 1.5" Ref Phys: Lucetta Russel, Kirby Peoples  ID#: [id number]   Technician:    History: This is a 73 year old female with numbness, tingling, and pain in her left upper limb.  NCV & EMG Findings: Extensive electrodiagnostic evaluation of the left upper limb shows: Left median and ulnar sensory responses are absent. Left radial sensory response shows reduced amplitude (6 V). Left median (APB) and ulnar (ADM) motor responses show reduced amplitude (APB 4.9, ADM 4.8 mV). Chronic motor axon loss changes without accompanying active denervation changes are seen in the left first dorsal interosseous and extensor indicis proprius muscles. Patient did not well tolerate cervical paraspinals, but no definitive abnormalities are seen in the abbreviated testing.  Impression: This is an abnormal study. The findings are most consistent with the following: The residuals of an old intraspinal canal lesion (ie: motor radiculopathy) at the left C8 root or segment, mild in degree electrically. 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  Overall, compared to prior EMG dated 09/03/23, findings are very similar. There may be mild worsening of peripheral neuropathy.    ___________________________ Rommie Coats, MD    Nerve Conduction Studies Motor Nerve Results    Latency Amplitude F-Lat Segment Distance CV Comment  Site (ms) Norm (mV) Norm (ms)  (cm) (m/s) Norm   Left Median (APB) Motor  Wrist 3.6  < 4.0 *4.9  > 5.0        Elbow 8.9 - 4.6 -  Elbow-Wrist 31 58  > 50   Left Ulnar (ADM) Motor  Wrist 2.2  < 3.1 *4.8  > 7.0        Bel elbow 7.0 - 4.8 -  Bel elbow-Wrist 25 52  > 50   Ab elbow 9.0 - 4.3 -  Ab  elbow-Bel elbow 10 50 -    Sensory Sites    Neg Peak Lat Amplitude (O-P) Segment Distance Velocity Comment  Site (ms) Norm (V) Norm  (cm) (ms)   Left Median Sensory  Wrist-Dig II *NR  < 3.8 *NR  > 10 Wrist-Dig II 13    Left Radial Sensory  Forearm-Wrist 2.2  < 2.8 *6  > 10 Forearm-Wrist 10    Left Ulnar Sensory  Wrist *NR  < 3.2 *NR  > 5 Wrist-Dig V 11     Electromyography   Side Muscle Ins.Act Fibs Fasc Recrt Amp Dur Poly Activation Comment  Left FDI Nml Nml Nml *1- *1+ *1+ Nml Nml N/A  Left EIP Nml Nml Nml *2- *1+ *1+ Nml Nml N/A  Left Pronator teres Nml Nml Nml Nml Nml Nml Nml Nml N/A  Left APB Nml Nml Nml Nml Nml Nml Nml Nml N/A  Left Biceps Nml Nml Nml Nml Nml Nml Nml Nml N/A  Left Triceps Nml Nml Nml Nml Nml Nml Nml Nml N/A  Left Deltoid Nml Nml Nml Nml Nml Nml Nml Nml N/A  Left C7 PSP Nml Nml Nml Nml Nml Nml Nml Nml N/A      Waveforms:  Motor      Sensory

## 2024-03-03 ENCOUNTER — Ambulatory Visit: Admitting: Neurosurgery

## 2024-03-04 LAB — CUP PACEART REMOTE DEVICE CHECK
Date Time Interrogation Session: 20250609171818
Implantable Pulse Generator Implant Date: 20241113

## 2024-03-07 ENCOUNTER — Ambulatory Visit (INDEPENDENT_AMBULATORY_CARE_PROVIDER_SITE_OTHER): Payer: Medicare HMO

## 2024-03-07 DIAGNOSIS — I70219 Atherosclerosis of native arteries of extremities with intermittent claudication, unspecified extremity: Secondary | ICD-10-CM | POA: Diagnosis not present

## 2024-03-07 NOTE — Progress Notes (Signed)
 Carelink Summary Report / Loop Recorder

## 2024-03-07 NOTE — Addendum Note (Signed)
 Addended by: Lott Rouleau A on: 03/07/2024 10:48 AM   Modules accepted: Orders

## 2024-03-08 ENCOUNTER — Ambulatory Visit: Payer: Self-pay | Admitting: Cardiology

## 2024-03-13 ENCOUNTER — Ambulatory Visit (INDEPENDENT_AMBULATORY_CARE_PROVIDER_SITE_OTHER): Admitting: Nurse Practitioner

## 2024-03-14 ENCOUNTER — Ambulatory Visit (INDEPENDENT_AMBULATORY_CARE_PROVIDER_SITE_OTHER): Admitting: Nurse Practitioner

## 2024-03-24 ENCOUNTER — Ambulatory Visit (INDEPENDENT_AMBULATORY_CARE_PROVIDER_SITE_OTHER): Admitting: Vascular Surgery

## 2024-03-24 ENCOUNTER — Encounter (INDEPENDENT_AMBULATORY_CARE_PROVIDER_SITE_OTHER): Payer: Self-pay | Admitting: Vascular Surgery

## 2024-03-24 VITALS — BP 100/66 | HR 103

## 2024-03-24 DIAGNOSIS — N186 End stage renal disease: Secondary | ICD-10-CM | POA: Diagnosis not present

## 2024-03-24 DIAGNOSIS — I739 Peripheral vascular disease, unspecified: Secondary | ICD-10-CM

## 2024-03-24 DIAGNOSIS — Z9889 Other specified postprocedural states: Secondary | ICD-10-CM | POA: Diagnosis not present

## 2024-03-24 DIAGNOSIS — I10 Essential (primary) hypertension: Secondary | ICD-10-CM | POA: Diagnosis not present

## 2024-03-24 NOTE — Progress Notes (Signed)
 Subjective:    Patient ID: Deanna [name], female    DOB: Mar 16, 1951, 73 y.o.   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   required intubation   Anemia    C3 cervical fracture (HCC) 04/2023   Cervical stress fracture    CHF (congestive heart failure) (HCC)    combined systolic and diastolic   Coronary artery disease    ESRD on dialysis (HCC)    M-W-Fr   GERD (gastroesophageal reflux disease)    Hyperlipidemia    Hypertension    Pneumonia    Secondary hyperparathyroidism of renal origin (HCC)    Stroke (HCC)    with right sided weakness   Type 2 diabetes mellitus with diabetic neuropathy (HCC)    Ulnar neuropathy at elbow of right upper extremity     Social History   Socioeconomic History   Marital status: Single    Spouse name: Not on file   Number of children: 3   Years of education: Not on file   Highest education level: Not on file  Occupational History   Not on file  Tobacco Use   Smoking status: Never   Smokeless tobacco: Never  Vaping Use   Vaping status: Never Used  Substance and Sexual Activity   Alcohol use: No   Drug use: No   Sexual activity: Not Currently  Other Topics Concern   Not on file  Social History Narrative   Temporarily staying  at [facility].   Social Drivers of Corporate investment banker Strain: Not on file  Food Insecurity: No Food Insecurity (08/04/2023)   Hunger Vital Sign    Worried About Running Out of Food in the Last Year: Never true    Ran Out of Food in the Last Year: Never true  Transportation Needs: No Transportation Needs (08/04/2023)   PRAPARE - Administrator, Civil Service (Medical): No    Lack of Transportation (Non-Medical): No  Physical  Activity: Not on file  Stress: Not on file  Social Connections: Not on file  Intimate Partner Violence: Not At Risk (08/04/2023)   Humiliation, Afraid, Rape, and Kick questionnaire    Fear of Current or Ex-Partner: No    Emotionally Abused: No    Physically Abused: No    Sexually Abused: No    Past Surgical History:  Procedure Laterality Date   A/V FISTULAGRAM Right 06/28/2023   Procedure: A/V Fistulagram;  Surgeon: Marea Selinda RAMAN, MD;  Location: [facility];  Service: Cardiovascular;  Laterality: Right;   A/V FISTULAGRAM Left 01/31/2024   Procedure: A/V Fistulagram;  Surgeon: Marea Selinda RAMAN, MD;  Location: [facility];  Service: Cardiovascular;  Laterality: Left;   AV FISTULA PLACEMENT Right 05/17/2023   Procedure: INSERTION OF ARTERIOVENOUS (AV) GORE-TEX GRAFT ARM;  Surgeon: Marea Selinda RAMAN, MD;  Location: [facility];  Service: Vascular;  Laterality: Right;   CARDIAC CATHETERIZATION Left 03/01/2016   Procedure: Left Heart Cath and Coronary Angiography;  Surgeon: Vinie DELENA Jude, MD;  Location: [facility];  Service: Cardiovascular;  Laterality: Left;   CESAREAN SECTION     DIALYSIS/PERMA CATHETER INSERTION Right 01/10/2022  Procedure: DIALYSIS/PERMA CATHETER INSERTION;  Surgeon: Tisa Curry LABOR, MD;  Location: [facility];  Service: Cardiovascular;  Laterality: Right;   DIALYSIS/PERMA CATHETER INSERTION N/A 04/12/2022   Procedure: DIALYSIS/PERMA CATHETER INSERTION;  Surgeon: Marea Selinda RAMAN, MD;  Location: [facility];  Service: Cardiovascular;  Laterality: N/A;   DIALYSIS/PERMA CATHETER REMOVAL N/A 09/03/2023   Procedure: DIALYSIS/PERMA CATHETER REMOVAL;  Surgeon: Marea Selinda RAMAN, MD;  Location: [facility];  Service: Cardiovascular;  Laterality: N/A;   INTUBATION-ENDOTRACHEAL WITH TRACHEOSTOMY STANDBY Left 01/14/2022   Procedure: INTUBATION-NASAL WITH TRACHEOSTOMY STANDBY;  Surgeon: Herminio Miu, MD;  Location: [facility];  Service: ENT;   Laterality: Left;   LOOP RECORDER INSERTION Left 08/08/2023   Procedure: LOOP RECORDER INSERTION;  Surgeon: Cindie Ole DASEN, MD;  Location: [facility];  Service: Cardiovascular;  Laterality: Left;   LOWER EXTREMITY ANGIOGRAPHY Right 07/23/2023   Procedure: Lower Extremity Angiography;  Surgeon: Marea Selinda RAMAN, MD;  Location: [facility];  Service: Cardiovascular;  Laterality: Right;   LOWER EXTREMITY ANGIOGRAPHY Left 01/03/2024   Procedure: Lower Extremity Angiography;  Surgeon: Marea Selinda RAMAN, MD;  Location: [facility];  Service: Cardiovascular;  Laterality: Left;   NERVE TRANSFER Right 11/08/2023   Procedure: ANTERIOR INTEROSSEOUS NERVE TO ULNAR NERVE TRANSFER, END TO SIDE;  Surgeon: Claudene Penne ORN, MD;  Location: [facility];  Service: Neurosurgery;  Laterality: Right;   TEMPORARY DIALYSIS CATHETER N/A 01/04/2022   Procedure: TEMPORARY DIALYSIS CATHETER;  Surgeon: Marea Selinda RAMAN, MD;  Location: [facility];  Service: Cardiovascular;  Laterality: N/A;   ULNAR NERVE DECOMPRESSION Right 11/08/2023   Procedure: RIGHT ULNAR NERVE DECOMPRESSION;  Surgeon: Claudene Penne ORN, MD;  Location: [facility];  Service: Neurosurgery;  Laterality: Right;    Family History  Problem Relation Age of Onset   Osteoarthritis Brother     Allergies  Allergen Reactions   Ace Inhibitors Swelling   Entresto  [Sacubitril -Valsartan ] Swelling    Patient said she took both lisinopril and Entresto  so it is not clear which one she actually reacted to.  For safety reasons, she has been advised to avoid both ACE inhibitors and Entresto .   Lisinopril Swelling    Severe Angioedema (requiring Nasotracheal intubation)   Peanut-Containing Drug Products    Penicillins Rash   Tomato Rash and Other (See Comments)       Latest Ref Rng & Units 01/16/2024    5:59 AM 11/08/2023    6:49 AM 10/11/2023    2:52 PM  CBC  WBC 4.0 - 10.5 K/uL   7.7   Hemoglobin 12.0 - 15.0 g/dL 9.7  86.0  89.3    Hematocrit 36.0 - 46.0 %  41.0  33.9   Platelets 150 - 400 K/uL   246       CMP     Component Value Date/Time   NA 138 11/08/2023 0649   K 5.6 (H) 01/16/2024 0559   CL 98 11/08/2023 0649   CO2 26 10/11/2023 1452   GLUCOSE 112 (H) 11/08/2023 0649   BUN 34 (H) 11/08/2023 0649   CREATININE 4.80 (H) 11/08/2023 0649   CALCIUM  8.5 (L) 10/11/2023 1452   PROT 7.9 10/11/2023 1452   ALBUMIN  3.7 10/11/2023 1452   AST 19 10/11/2023 1452   ALT 21 10/11/2023 1452   ALKPHOS 130 (H) 10/11/2023 1452   BILITOT 0.5 10/11/2023 1452   GFRNONAA 9 (L) 10/11/2023 1452     No results found.  Assessment & Plan:   1. ESRD (end stage renal disease) (HCC) (Primary) Patient currently is not experiencing any difficulties with her dialysis access post revascularization.  No complications to note.  Patient will follow-up in 6 months with HDA.  2. Peripheral arterial disease with history of revascularization (HCC) On 01/31/2024 patient underwent Right loop forearm arteriovenous graft cannulation under ultrasound guidance. Right arm shuntogram was completed. Percutaneous transluminal angioplasty of right upper arm brachial vein to the axillary vein with 6 mm diameter high-pressure and 7 mm diameter Lutonix drug-coated angioplasty balloons were used.   Patient endorses her current dialysis is using her graft site.  There is no complications to note no bleeding postdialysis.  Patient will follow-up in 6 months with HDA and ABIs.  3. Essential (primary) hypertension Continue antihypertensive medications as already ordered, these medications have been reviewed and there are no changes at this time.   Current Outpatient Medications on File Prior to Visit  Medication Sig Dispense Refill   ammonium lactate (AMLACTIN) 12 % cream Apply topically.     aspirin  EC 81 MG tablet Take 81 mg by mouth daily.     atorvastatin  (LIPITOR) 40 MG tablet Take 40 mg by mouth daily.     calcium  acetate (PHOSLO) 667 MG  capsule Take 1,334 mg by mouth 3 (three) times daily.     clopidogrel  (PLAVIX ) 75 MG tablet Take 75 mg by mouth at bedtime.     ferrous sulfate  325 (65 FE) MG EC tablet Take 325 mg by mouth in the morning.     gabapentin  (NEURONTIN ) 100 MG capsule Take 100 mg by mouth 2 (two) times daily as needed.     metoprolol  tartrate (LOPRESSOR ) 25 MG tablet Take 1 tablet (25 mg total) by mouth 2 (two) times daily. 60 tablet 0   ondansetron  (ZOFRAN -ODT) 4 MG disintegrating tablet      oxyCODONE -acetaminophen  (PERCOCET) 5-325 MG tablet Take 1 tablet by mouth every 4 (four) hours as needed for severe pain (pain score 7-10). 5 tablet 0   pregabalin (LYRICA) 25 MG capsule Take 25 mg by mouth at bedtime.     sevelamer  (RENAGEL ) 800 MG tablet Take 1,600 mg by mouth 3 (three) times daily with meals. (Patient not taking: Reported on 01/03/2024)     Vitamin D, Ergocalciferol, (DRISDOL) 1.25 MG (50000 UNIT) CAPS capsule Take 50,000 Units by mouth once a week. Wednesday     No current facility-administered medications on file prior to visit.    There are no Patient Instructions on file for this visit. No follow-ups on file.   Gwendlyn JONELLE Shank, NP

## 2024-03-26 ENCOUNTER — Ambulatory Visit: Admitting: Neurosurgery

## 2024-03-26 VITALS — BP 128/60 | Ht 73.5 in | Wt 189.0 lb

## 2024-03-26 DIAGNOSIS — Z09 Encounter for follow-up examination after completed treatment for conditions other than malignant neoplasm: Secondary | ICD-10-CM

## 2024-03-26 DIAGNOSIS — G5621 Lesion of ulnar nerve, right upper limb: Secondary | ICD-10-CM | POA: Diagnosis not present

## 2024-03-26 NOTE — Progress Notes (Signed)
   REFERRING PHYSICIAN:  [place], [facility] [address] [place],  [place] [address]  DOS: 11/07/22, Right ulnar nerve decompression, anterior interosseous nerve and ulnar nerve transfer end to side  HISTORY OF PRESENT ILLNESS: Deanna [name] is here today to discuss her recent EMG.  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  Also a chronic cervical radiculopathy.  She also continues to have back pain.  PHYSICAL EXAMINATION:  NEUROLOGICAL:  General: In no acute distress.   Awake, alert, oriented to person, place, and time.  Pupils equal round and reactive to light.  Facial tone is symmetric.  Tongue protrusion is midline.  Cranial nerves grossly intact however patient is not compliant fully with examination.  Strength:  Concerning exam.  No flexion or extension of left wrist, 0/5.  Appears to have good bicep and tricep strength relatively.  Limited activation of deltoid.  I am able to passively range the left shoulder somewhat however his pain limited.  In bilateral lower extremities, difficult to exam.  Patient does appear to have some weakness in her left lower extremity as well, difficult to tell how much this is deviating from her baseline due to effort.  Incision c/d/I   [facility]  [address] [place], [address]  [place], [place] [address] Tel: [phone] Fax: [phone] Test Date:  02/12/2024   Patient: Deanna [name] DOB: [birth year] Physician: Venetia Potters, MD  Sex: Female Height: 6' 1.5 Ref Phys: Glade Boys, DEVONNA  ID#: [id number]     Technician:      History: This is a 73 year old female with numbness, tingling, and pain in her left upper limb.   NCV & EMG Findings: Extensive electrodiagnostic evaluation of the left upper limb shows: Left median and ulnar sensory responses are absent. Left radial sensory response shows reduced amplitude (6 V). Left median (APB) and ulnar (ADM) motor responses show reduced amplitude (APB  4.9, ADM 4.8 mV). Chronic motor axon loss changes without accompanying active denervation changes are seen in the left first dorsal interosseous and extensor indicis proprius muscles. Patient did not well tolerate cervical paraspinals, but no definitive abnormalities are seen in the abbreviated testing.   Impression: This is an abnormal study. The findings are most consistent with the following: The residuals of an old intraspinal canal lesion (ie: motor radiculopathy) at the left C8 root or segment, mild in degree electrically. 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   Overall, compared to prior EMG dated 09/03/23, findings are very similar. There may be mild worsening of peripheral neuropathy.       ___________________________ Venetia Potters, MD  Assessment / Plan: Deanna [name] is today to follow-up on a recent EMG.  It showed a large fiber neuropathy with axonal loss in the left upper extremity.  She states that she has been told this before and does not want further workup.  She also has back pain, I let her know that is likely progressive arthritis and offered further imaging, she would like to avoid this as she feels that she gets almost complete relief with Biofreeze.  At this point she can continue to follow as needed.  Advised to contact the office if any questions or concerns arise.   Penne MICAEL Sharps, MD Dept of Neurosurgery

## 2024-04-03 ENCOUNTER — Telehealth: Payer: Self-pay

## 2024-04-03 NOTE — Telephone Encounter (Signed)
 Called patient to schedule New Patient Appointment. VM Full.

## 2024-04-07 ENCOUNTER — Ambulatory Visit: Payer: Self-pay | Admitting: Cardiology

## 2024-04-07 ENCOUNTER — Ambulatory Visit (INDEPENDENT_AMBULATORY_CARE_PROVIDER_SITE_OTHER): Payer: Self-pay

## 2024-04-07 DIAGNOSIS — I70219 Atherosclerosis of native arteries of extremities with intermittent claudication, unspecified extremity: Secondary | ICD-10-CM

## 2024-04-07 LAB — CUP PACEART REMOTE DEVICE CHECK
Date Time Interrogation Session: 20250714003900
Implantable Pulse Generator Implant Date: 20241113

## 2024-04-16 ENCOUNTER — Other Ambulatory Visit: Payer: Self-pay | Admitting: Orthopedic Surgery

## 2024-04-16 DIAGNOSIS — M1612 Unilateral primary osteoarthritis, left hip: Secondary | ICD-10-CM

## 2024-04-24 NOTE — Progress Notes (Signed)
 Carelink Summary Report / Loop Recorder

## 2024-04-29 ENCOUNTER — Ambulatory Visit
Admission: RE | Admit: 2024-04-29 | Discharge: 2024-04-29 | Disposition: A | Discharge status: Home / Self Care | Source: Ambulatory Visit | Attending: Orthopedic Surgery | Admitting: Orthopedic Surgery

## 2024-04-29 DIAGNOSIS — M1612 Unilateral primary osteoarthritis, left hip: Secondary | ICD-10-CM | POA: Diagnosis present

## 2024-05-08 ENCOUNTER — Ambulatory Visit: Payer: Self-pay | Admitting: Cardiology

## 2024-05-08 ENCOUNTER — Ambulatory Visit (INDEPENDENT_AMBULATORY_CARE_PROVIDER_SITE_OTHER): Payer: Self-pay

## 2024-05-08 DIAGNOSIS — I70219 Atherosclerosis of native arteries of extremities with intermittent claudication, unspecified extremity: Secondary | ICD-10-CM

## 2024-05-08 LAB — CUP PACEART REMOTE DEVICE CHECK
Date Time Interrogation Session: 20250813234216
Implantable Pulse Generator Implant Date: 20241113

## 2024-05-20 ENCOUNTER — Encounter: Payer: Self-pay | Admitting: Student in an Organized Health Care Education/Training Program

## 2024-05-20 ENCOUNTER — Ambulatory Visit
Attending: Student in an Organized Health Care Education/Training Program | Admitting: Student in an Organized Health Care Education/Training Program

## 2024-05-20 VITALS — BP 137/71 | HR 90 | Temp 97.5°F | Resp 17 | Ht 73.5 in | Wt 170.0 lb

## 2024-05-20 DIAGNOSIS — M533 Sacrococcygeal disorders, not elsewhere classified: Secondary | ICD-10-CM | POA: Diagnosis present

## 2024-05-20 DIAGNOSIS — M1612 Unilateral primary osteoarthritis, left hip: Secondary | ICD-10-CM | POA: Diagnosis present

## 2024-05-20 DIAGNOSIS — M25552 Pain in left hip: Secondary | ICD-10-CM | POA: Insufficient documentation

## 2024-05-20 DIAGNOSIS — M461 Sacroiliitis, not elsewhere classified: Secondary | ICD-10-CM | POA: Insufficient documentation

## 2024-05-20 DIAGNOSIS — G8929 Other chronic pain: Secondary | ICD-10-CM | POA: Diagnosis present

## 2024-05-20 NOTE — Progress Notes (Signed)
 Safety precautions to be maintained throughout the outpatient stay will include: orient to surroundings, keep bed in low position, maintain call bell within reach at all times, provide assistance with transfer out of bed and ambulation.

## 2024-05-20 NOTE — Patient Instructions (Addendum)
 No need to stop Plavix  for procedures per Dr. Marcelino.   GENERAL RISKS AND COMPLICATIONS  What are the risk, side effects and possible complications? Generally speaking, most procedures are safe.  However, with any procedure there are risks, side effects, and the possibility of complications.  The risks and complications are dependent upon the sites that are lesioned, or the type of nerve block to be performed.  The closer the procedure is to the spine, the more serious the risks are.  Great care is taken when placing the radio frequency needles, block needles or lesioning probes, but sometimes complications can occur. Infection: Any time there is an injection through the skin, there is a risk of infection.  This is why sterile conditions are used for these blocks.  There are four possible types of infection. Localized skin infection. Central Nervous System Infection-This can be in the form of Meningitis, which can be deadly. Epidural Infections-This can be in the form of an epidural abscess, which can cause pressure inside of the spine, causing compression of the spinal cord with subsequent paralysis. This would require an emergency surgery to decompress, and there are no guarantees that the patient would recover from the paralysis. Discitis-This is an infection of the intervertebral discs.  It occurs in about 1% of discography procedures.  It is difficult to treat and it may lead to surgery.        2. Pain: the needles have to go through skin and soft tissues, will cause soreness.       3. Damage to internal structures:  The nerves to be lesioned may be near blood vessels or    other nerves which can be potentially damaged.       4. Bleeding: Bleeding is more common if the patient is taking blood thinners such as  aspirin , Coumadin, Ticiid, Plavix , etc., or if he/she have some genetic predisposition  such as hemophilia. Bleeding into the spinal canal can cause compression of the spinal  cord with  subsequent paralysis.  This would require an emergency surgery to  decompress and there are no guarantees that the patient would recover from the  paralysis.       5. Pneumothorax:  Puncturing of a lung is a possibility, every time a needle is introduced in  the area of the chest or upper back.  Pneumothorax refers to free air around the  collapsed lung(s), inside of the thoracic cavity (chest cavity).  Another two possible  complications related to a similar event would include: Hemothorax and Chylothorax.   These are variations of the Pneumothorax, where instead of air around the collapsed  lung(s), you may have blood or chyle, respectively.       6. Spinal headaches: They may occur with any procedures in the area of the spine.       7. Persistent CSF (Cerebro-Spinal Fluid) leakage: This is a rare problem, but may occur  with prolonged intrathecal or epidural catheters either due to the formation of a fistulous  track or a dural tear.       8. Nerve damage: By working so close to the spinal cord, there is always a possibility of  nerve damage, which could be as serious as a permanent spinal cord injury with  paralysis.       9. Death:  Although rare, severe deadly allergic reactions known as Anaphylactic  reaction can occur to any of the medications used.      10. Worsening of the symptoms:  We can always make thing worse.  What are the chances of something like this happening? Chances of any of this occuring are extremely low.  By statistics, you have more of a chance of getting killed in a motor vehicle accident: while driving to the hospital than any of the above occurring .  Nevertheless, you should be aware that they are possibilities.  In general, it is similar to taking a shower.  Everybody knows that you can slip, hit your head and get killed.  Does that mean that you should not shower again?  Nevertheless always keep in mind that statistics do not mean anything if you happen to be on the wrong  side of them.  Even if a procedure has a 1 (one) in a 1,000,000 (million) chance of going wrong, it you happen to be that one..Also, keep in mind that by statistics, you have more of a chance of having something go wrong when taking medications.  Who should not have this procedure? If you are on a blood thinning medication (e.g. Coumadin, Plavix , see list of Blood Thinners), or if you have an active infection going on, you should not have the procedure.  If you are taking any blood thinners, please inform your physician.  How should I prepare for this procedure? Do not eat or drink anything at least six hours prior to the procedure. Bring a driver with you .  It cannot be a taxi. Come accompanied by an adult that can drive you back, and that is strong enough to help you if your legs get weak or numb from the local anesthetic. Take all of your medicines the morning of the procedure with just enough water to swallow them. If you have diabetes, make sure that you are scheduled to have your procedure done first thing in the morning, whenever possible. If you have diabetes, take only half of your insulin  dose and notify our nurse that you have done so as soon as you arrive at the clinic. If you are diabetic, but only take blood sugar pills (oral hypoglycemic), then do not take them on the morning of your procedure.  You may take them after you have had the procedure. Do not take aspirin  or any aspirin -containing medications, at least eleven (11) days prior to the procedure.  They may prolong bleeding. Wear loose fitting clothing that may be easy to take off and that you would not mind if it got stained with Betadine or blood. Do not wear any jewelry or perfume Remove any nail coloring.  It will interfere with some of our monitoring equipment.  NOTE: Remember that this is not meant to be interpreted as a complete list of all possible complications.  Unforeseen problems may occur.  BLOOD THINNERS The  following drugs contain aspirin  or other products, which can cause increased bleeding during surgery and should not be taken for 2 weeks prior to and 1 week after surgery.  If you should need take something for relief of minor pain, you may take acetaminophen  which is found in Tylenol ,m Datril, Anacin-3 and Panadol. It is not blood thinner. The products listed below are.  Do not take any of the products listed below in addition to any listed on your instruction sheet.  A.P.C or A.P.C with Codeine Codeine Phosphate Capsules #3 Ibuprofen Ridaura  ABC compound Congesprin Imuran rimadil  Advil Cope Indocin Robaxisal  Alka-Seltzer Effervescent Pain Reliever and Antacid Coricidin or Coricidin-D  Indomethacin Rufen  Alka-Seltzer plus Cold Medicine  Cosprin Ketoprofen S-A-C Tablets  Anacin Analgesic Tablets or Capsules Coumadin Korlgesic Salflex  Anacin Extra Strength Analgesic tablets or capsules CP-2 Tablets Lanoril Salicylate  Anaprox Cuprimine Capsules Levenox Salocol  Anexsia-D Dalteparin Magan Salsalate  Anodynos Darvon compound Magnesium  Salicylate Sine-off  Ansaid Dasin Capsules Magsal Sodium Salicylate  Anturane Depen Capsules Marnal Soma  APF Arthritis pain formula Dewitt's Pills Measurin  Stanback  Argesic Dia-Gesic Meclofenamic Sulfinpyrazone  Arthritis Bayer Timed Release Aspirin  Diclofenac Meclomen Sulindac  Arthritis pain formula Anacin Dicumarol Medipren Supac  Analgesic (Safety coated) Arthralgen Diffunasal Mefanamic Suprofen  Arthritis Strength Bufferin Dihydrocodeine Mepro Compound Suprol  Arthropan liquid Dopirydamole Methcarbomol with Aspirin  Synalgos  ASA tablets/Enseals Disalcid Micrainin Tagament  Ascriptin Doan's Midol Talwin  Ascriptin A/D Dolene Mobidin Tanderil  Ascriptin Extra Strength Dolobid Moblgesic Ticlid  Ascriptin with Codeine Doloprin or Doloprin with Codeine Momentum Tolectin  Asperbuf Duoprin Mono-gesic Trendar  Aspergum Duradyne Motrin or Motrin IB Triminicin   Aspirin  plain, buffered or enteric coated Durasal Myochrisine Trigesic  Aspirin  Suppositories Easprin Nalfon Trillsate  Aspirin  with Codeine Ecotrin Regular or Extra Strength Naprosyn Uracel  Atromid-S Efficin Naproxen Ursinus  Auranofin Capsules Elmiron Neocylate Vanquish  Axotal Emagrin Norgesic Verin  Azathioprine Empirin or Empirin with Codeine Normiflo Vitamin E  Azolid Emprazil Nuprin Voltaren  Bayer Aspirin  plain, buffered or children's or timed BC Tablets or powders Encaprin Orgaran Warfarin Sodium  Buff-a-Comp Enoxaparin  Orudis Zorpin  Buff-a-Comp with Codeine Equegesic Os-Cal-Gesic   Buffaprin Excedrin plain, buffered or Extra Strength Oxalid   Bufferin Arthritis Strength Feldene Oxphenbutazone   Bufferin plain or Extra Strength Feldene Capsules Oxycodone  with Aspirin    Bufferin with Codeine Fenoprofen Fenoprofen Pabalate or Pabalate-SF   Buffets II Flogesic Panagesic   Buffinol plain or Extra Strength Florinal or Florinal with Codeine Panwarfarin   Buf-Tabs Flurbiprofen Penicillamine   Butalbital Compound Four-way cold tablets Penicillin   Butazolidin Fragmin Pepto-Bismol   Carbenicillin Geminisyn Percodan   Carna Arthritis Reliever Geopen Persantine   Carprofen Gold's salt Persistin   Chloramphenicol Goody's Phenylbutazone   Chloromycetin Haltrain Piroxlcam   Clmetidine heparin  Plaquenil   Cllnoril Hyco-pap Ponstel   Clofibrate Hydroxy chloroquine Propoxyphen         Before stopping any of these medications, be sure to consult the physician who ordered them.  Some, such as Coumadin (Warfarin) are ordered to prevent or treat serious conditions such as deep thrombosis, pumonary embolisms, and other heart problems.  The amount of time that you may need off of the medication may also vary with the medication and the reason for which you were taking it.  If you are taking any of these medications, please make sure you notify your pain physician before you undergo any  procedures.         Sacroiliac (SI) Joint Injection Patient Information  Description: The sacroiliac joint connects the scrum (very low back and tailbone) to the ilium (a pelvic bone which also forms half of the hip joint).  Normally this joint experiences very little motion.  When this joint becomes inflamed or unstable low back and or hip and pelvis pain may result.  Injection of this joint with local anesthetics (numbing medicines) and steroids can provide diagnostic information and reduce pain.  This injection is performed with the aid of x-ray guidance into the tailbone area while you are lying on your stomach.   You may experience an electrical sensation down the leg while this is being done.  You may also experience numbness.  We also may ask if  we are reproducing your normal pain during the injection.  Conditions which may be treated SI injection:  Low back, buttock, hip or leg pain  Preparation for the Injection:  Do not eat any solid food or dairy products within 8 hours of your appointment.  You may drink clear liquids up to 3 hours before appointment.  Clear liquids include water, black coffee, juice or soda.  No milk or cream please. You may take your regular medications, including pain medications with a sip of water before your appointment.  Diabetics should hold regular insulin  (if take separately) and take 1/2 normal NPH dose the morning of the procedure.  Carry some sugar containing items with you to your appointment. A driver must accompany you and be prepared to drive you home after your procedure. Bring all of your current medications with you. An IV may be inserted and sedation may be given at the discretion of the physician. A blood pressure cuff, EKG and other monitors will often be applied during the procedure.  Some patients may need to have extra oxygen administered for a short period.  You will be asked to provide medical information, including your allergies,  prior to the procedure.  We must know immediately if you are taking blood thinners (like Coumadin/Warfarin) or if you are allergic to IV iodine  contrast (dye).  We must know if you could possible be pregnant.  Possible side effects:  Bleeding from needle site Infection (rare, may require surgery) Nerve injury (rare) Numbness & tingling (temporary) A brief convulsion or seizure Light-headedness (temporary) Pain at injection site (several days) Decreased blood pressure (temporary) Weakness in the leg (temporary)   Call if you experience:  New onset weakness or numbness of an extremity below the injection site that last more than 8 hours. Hives or difficulty breathing ( go to the emergency room) Inflammation or drainage at the injection site Any new symptoms which are concerning to you  Please note:  Although the local anesthetic injected can often make your back/ hip/ buttock/ leg feel good for several hours after the injections, the pain will likely return.  It takes 3-7 days for steroids to work in the sacroiliac area.  You may not notice any pain relief for at least that one week.  If effective, we will often do a series of three injections spaced 3-6 weeks apart to maximally decrease your pain.  After the initial series, we generally will wait some months before a repeat injection of the same type.  If you have any questions, please call [phone] [facility]

## 2024-05-20 NOTE — Progress Notes (Signed)
 PROVIDER NOTE: Interpretation of information contained herein should be left to medically-trained personnel. Specific patient instructions are provided elsewhere under Patient Instructions section of medical record. This document was created in part using AI and STT-dictation technology, any transcriptional errors that may result from this process are unintentional.  Patient: Deanna [name]  Service: E/M Encounter  Provider: Wallie Sherry, MD  DOB: June 13, 1951  Delivery: Face-to-face  Specialty: Interventional Pain Management  MRN: [id number]  Setting: Ambulatory outpatient facility  Specialty designation: 09  Type: New Patient  Location: Outpatient office facility  PCP: Brutus Delon SAUNDERS, NP  DOS: 05/20/2024    Referring Prov.: Sherry Munsoor, MD   Primary Reason(s) for Visit: Encounter for initial evaluation of one or more chronic problems (new to examiner) potentially causing chronic pain, and posing a threat to normal musculoskeletal function. (Level of risk: High) CC: Hip Pain (left)  HPI  Deanna [name] is a 73 y.o. year old, female patient, who comes for the first time to our practice referred by [name], Munsoor, MD for our initial evaluation of her chronic pain. She has Angina of effort (HCC); Essential (primary) hypertension; HFrEF (heart failure with reduced ejection fraction) (HCC); Type 2 diabetes mellitus without complications (HCC); Right sided weakness; Hypokalemia; Acute kidney injury superimposed on chronic kidney disease (HCC); Anemia; Acute CVA (cerebrovascular accident) (HCC); Lobar pneumonia (HCC); CVA (cerebral vascular accident) (HCC); Acute right flank pain; Weakness of right lower extremity; Right sided numbness; Elevated troponin; Hyperkalemia; Angioedema; Acute respiratory failure with hypoxia and hypercapnia (HCC); Severe sepsis (HCC); SOB (shortness of breath); Chest pain; Hypoglycemia associated with diabetes (HCC); End-stage renal disease on hemodialysis (HCC); Aspiration  pneumonia (HCC); Shortness of breath; Closed nondisplaced fracture of third cervical vertebra (HCC); Fall; Anemia in ESRD (end-stage renal disease) (HCC); CAD (coronary artery disease); Leukocytosis; Type II diabetes mellitus with renal manifestations (HCC); PAD (peripheral artery disease) (HCC); Ulnar neuropathy at elbow of right upper extremity; Left hip pain; Primary osteoarthritis of left hip; Chronic left SI joint pain; and SI joint arthritis (HCC) on their problem list. Today she comes in for evaluation of her Hip Pain (left)  Pain Assessment: Location: Left Hip Radiating: to left buttock down side of left leg to foot Onset: More than a month ago Duration: Chronic pain Quality: Sharp, Shooting Severity: 10-Worst pain ever/10 (subjective, self-reported pain score)  Effect on ADL: limits adls, pt states she has not been able to bear weight since she fell 12/2023 Timing: Constant Modifying factors: bioflex,percocet BP: 137/71  HR: 90  Onset and Duration: Date of injury: 01/13/2024 Cause of pain: fall Severity: Getting worse, No change since onset, NAS-11 at its worse: 10/10, NAS-11 at its best: 10/10, NAS-11 now: 10/10, and NAS-11 on the average: 10/10 Timing: not answered Aggravating Factors: Prolonged sitting, Twisting, Walking, Walking uphill, and Walking downhill Alleviating Factors: Bending, Lying down, Medications, Sitting, Sleeping, and Standing Associated Problems: Day-time cramps, Night-time cramps, Fatigue, Nausea, Numbness, Sadness, Spasms, Sweating, Swelling, Vomiting , Weakness, Pain that wakes patient up, and Pain that does not allow patient to sleep Quality of Pain: Aching, Annoying, Constant, Cramping, Cruel, Deep, Disabling, Distressing, Dreadful, Fearful, Nagging, Pressure-like, Sharp, Shooting, Sickening, Stabbing, Tender, Throbbing, Tingling, Tiring, Toothache-like, and Uncomfortable Previous Examinations or Tests: The patient denies , did not answer. Previous  Treatments: The patient denies did not answer.  Deanna [name] is being evaluated for possible interventional pain management therapies for the treatment of her chronic pain.   Discussed the use of AI scribe software for clinical note transcription with the  patient, who gave verbal consent to proceed.  History of Present Illness   Deanna [name] is a 73 year old female with severe arthritis who presents with left hip pain after a fall.  She experiences severe pain in her left hip, buttock, and groin areas, which began after a fall in April 2025. She can barely put weight on the left hip. She has a history of severe arthritis in the left hip and sacroiliac joint. She has not had any prior surgeries on her left hip and has not received any hip or sacroiliac joint injections previously.  The fall occurred while she was transitioning from her bed to a wheelchair, which was supposed to be locked. She fell to the floor and remained there for 45 minutes before assistance arrived. She discharged herself from the facility where the fall occurred due to dissatisfaction with the care provided.  She is currently on dialysis three times a week (Monday, Wednesday, and Friday) and takes Plavix  at 8 PM daily. She has a history of diabetes and has experienced multiple strokes, with the last one occurring in May 2025. She also had a prolonged hospital stay for pneumonia, during which she was given incorrect antibiotics initially.  No prior hip surgeries.       Meds   Current Outpatient Medications:    ammonium lactate (AMLACTIN) 12 % cream, Apply topically., Disp: , Rfl:    aspirin  EC 81 MG tablet, Take 81 mg by mouth daily., Disp: , Rfl:    atorvastatin  (LIPITOR) 40 MG tablet, Take 40 mg by mouth daily., Disp: , Rfl:    calcium  acetate (PHOSLO) 667 MG capsule, Take 1,334 mg by mouth 3 (three) times daily., Disp: , Rfl:    clopidogrel  (PLAVIX ) 75 MG tablet, Take 75 mg by mouth at bedtime., Disp: , Rfl:     gabapentin  (NEURONTIN ) 100 MG capsule, Take 100 mg by mouth 2 (two) times daily as needed., Disp: , Rfl:    Menthol, Topical Analgesic, (BIOFREEZE COOL THE PAIN) 4 % GEL, Apply topically., Disp: , Rfl:    metoprolol  tartrate (LOPRESSOR ) 25 MG tablet, Take 1 tablet (25 mg total) by mouth 2 (two) times daily., Disp: 60 tablet, Rfl: 0   midodrine  (PROAMATINE ) 5 MG tablet, Take 5 mg by mouth 2 (two) times daily with a meal., Disp: , Rfl:    ondansetron  (ZOFRAN -ODT) 4 MG disintegrating tablet, , Disp: , Rfl:    oxyCODONE -acetaminophen  (PERCOCET) 5-325 MG tablet, Take 1 tablet by mouth every 4 (four) hours as needed for severe pain (pain score 7-10)., Disp: 5 tablet, Rfl: 0   sertraline (ZOLOFT) 25 MG tablet, Take 25 mg by mouth daily., Disp: , Rfl:    Vitamin D, Ergocalciferol, (DRISDOL) 1.25 MG (50000 UNIT) CAPS capsule, Take 50,000 Units by mouth once a week. Wednesday, Disp: , Rfl:    ferrous sulfate  325 (65 FE) MG EC tablet, Take 325 mg by mouth in the morning. (Patient not taking: Reported on 05/20/2024), Disp: , Rfl:   Imaging Review  Cervical Imaging: Cervical MR wo contrast: Results for orders placed during the hospital encounter of 05/17/23  MR Cervical Spine Wo Contrast  Narrative CLINICAL DATA:  Neck trauma, fall  EXAM: MRI CERVICAL SPINE WITHOUT CONTRAST  TECHNIQUE: Multiplanar, multisequence MR imaging of the cervical spine was performed. No intravenous contrast was administered.  COMPARISON:  05/01/2023 MRI cervical spine, 05/17/2023 CT cervical spine  FINDINGS: Alignment: Exaggeration of the normal cervical kyphosis.  Vertebrae: Increased T2 hyperintense signal along the  known C3-C4 osteophyte fracture line, which is not unexpected in the setting of interval healing. Redemonstrated flowing osteophytes. Vertebral body heights are preserved.  Cord: Normal signal and morphology.  Posterior Fossa, vertebral arteries, paraspinal tissues: Persistent prevertebral edema  anterior to the fracture site. No evidence of ligamentous injury.  Disc levels:  C2-C3: Disc osteophyte complex. No spinal canal stenosis or neural foraminal narrowing.  C3-C4: Disc osteophyte complex. Facet and uncovertebral hypertrophy. Mild-to-moderate spinal canal stenosis. No significant neural foraminal narrowing.  C4-C5: Disc osteophyte complex. No spinal canal stenosis or neural foraminal narrowing.  C5-C6: Disc osteophyte complex. Facet and uncovertebral hypertrophy. Mild spinal canal stenosis and mild-to-moderate bilateral neural foraminal narrowing.  C6-C7: Disc osteophyte complex. Facet and uncovertebral hypertrophy. No spinal canal stenosis. Moderate right neural foraminal narrowing.  C7-T1: No significant disc bulge. No spinal canal stenosis or neuroforaminal narrowing.  IMPRESSION: 1. Slightly increased edema along the known C3-C4 osteophyte fracture line, which is not unexpected in the setting of interval healing. No evidence of ligamentous injury. 2. Otherwise unchanged degenerative findings.   Electronically Signed By: Donald Campion M.D. On: 05/17/2023 20:39    Narrative CLINICAL DATA:  Cervical radiculopathy.  EXAM: CT CERVICAL SPINE WITHOUT CONTRAST  TECHNIQUE: Multidetector CT imaging of the cervical spine was performed without intravenous contrast. Multiplanar CT image reconstructions were also generated.  RADIATION DOSE REDUCTION: This exam was performed according to the departmental dose-optimization program which includes automated exposure control, adjustment of the mA and/or kV according to patient size and/or use of iterative reconstruction technique.  COMPARISON:  CT cervical spine 05/23/2023.  FINDINGS: Alignment: Normal.  Skull base and vertebrae: Diffuse idiopathic skeletal hyperostosis with bulky anterior osteophytes from C2 to T3, unchanged from prior. No acute fracture. Craniocervical junction is intact.  Soft tissues  and spinal canal: No prevertebral fluid or swelling. No visible canal hematoma.  Disc levels:  C2-C3: Small central disc osteophyte complex. No spinal canal stenosis or neural foraminal narrowing.  C3-C4: Central disc osteophyte complex results in no more than mild spinal canal stenosis.  C4-C5: Right-greater-than-left facet arthropathy and uncovertebral joint spurring results in moderate right neural foraminal narrowing.  C5-C6: Central disc-osteophyte complex results in at least mild spinal canal stenosis. Facet arthropathy and uncovertebral joint spurring results in moderate bilateral neural foraminal narrowing.  C6-C7: Central disc osteophyte complex results in no more than mild spinal canal stenosis. Right-greater-than-left facet arthropathy and uncovertebral joint spurring results in severe right neural foraminal narrowing.  C7-T1:  Unremarkable.  Upper chest: No acute findings.  Other: Atherosclerotic calcifications of the carotid bulbs.  IMPRESSION: 1. No acute fracture or traumatic listhesis of the cervical spine. 2. Multilevel cervical spondylosis, worst at C5-C6 where there is at least mild spinal canal stenosis and moderate bilateral neural foraminal narrowing. 3. Severe right neural foraminal narrowing at C6-C7. 4. Diffuse idiopathic skeletal hyperostosis with bulky anterior osteophytes from C2 to T3, unchanged from prior.   Electronically Signed By: Ryan Chess M.D. On: 08/02/2023 15:07   Narrative CLINICAL DATA:  Cervical fracture  EXAM: CERVICAL SPINE - 2-3 VIEW  COMPARISON:  Cervical spine CT today and 05/01/2023  FINDINGS: The previously seen fracture through the anterior bridging osteophyte at C3-4 difficult to visualize by plain film. See further discussion on cervical spine CT today. Normal alignment. Large flowing anterior osteophytes compatible with diffuse idiopathic skeletal hyperostosis. No visible new  fracture.  IMPRESSION: Difficult to visualize the previously seen fracture through the anterior bridging osteophyte at C3-4. See further discussion on the  cervical spine CT.  No additional acute bony abnormality.   Electronically Signed By: Franky Crease M.D. On: 05/17/2023 17:40    Narrative CLINICAL DATA:  Fusion.  EXAM: CERVICAL SPINE - COMPLETE 4+ VIEW  COMPARISON:  Cervical spine radiographs 05/09/2023. CT cervical spine 05/23/2023.  FINDINGS: Four weight-bearing views of the cervical spine with flexion and extension views. Unchanged diffuse idiopathic skeletal hyperostosis with bulky anterior osteophytes from at least C2 through the upper thoracic spine. Lucency through the anterior osteophytes at C3-4, unchanged. Normal alignment. No evidence of dynamic listhesis on flexion or extension views.  IMPRESSION: 1. Unchanged diffuse idiopathic skeletal hyperostosis with bulky anterior osteophytes from at least C2 through the upper thoracic spine. Lucency through the anterior osteophytes at C3-4, unchanged. 2. Normal alignment. No evidence of dynamic listhesis on flexion or extension views.   Electronically Signed By: Ryan Chess M.D. On: 08/02/2023 15:11   DG Shoulder Right  Narrative CLINICAL DATA:  Fall, landed on right arm.  EXAM: RIGHT SHOULDER - 2+ VIEW  COMPARISON:  None Available.  FINDINGS: Mild degenerative changes in the right AC and glenohumeral joints with joint space narrowing and spurring. No acute bony abnormality. Specifically, no fracture, subluxation, or dislocation. Right dialysis catheter noted with the tip at the cavoatrial junction. No visible rib fracture. Visualized right lung clear.  IMPRESSION: No acute bony abnormality.   Electronically Signed By: Franky Crease M.D. On: 05/17/2023 17:44   Narrative CLINICAL DATA:  Myelopathy and mid back pain  EXAM: MRI CERVICAL AND THORACIC SPINE WITHOUT  CONTRAST  TECHNIQUE: Multiplanar and multiecho pulse sequences of the cervical spine, to include the craniocervical junction and cervicothoracic junction, and the thoracic spine, were obtained without intravenous contrast.  COMPARISON:  None.  FINDINGS: MRI CERVICAL SPINE FINDINGS  Alignment: Physiologic.  Vertebrae: Bulky flowing anterior osteophytes from C2-T1. No acute abnormality.  Cord: Normal signal and morphology.  Posterior Fossa, vertebral arteries, paraspinal tissues: Negative.  Disc levels:  C1-2: Unremarkable.  C2-3: Small central disc protrusion and endplate spurring. There is no spinal canal stenosis. No neural foraminal stenosis.  C3-4: Small central disc protrusion with endplate spurring. There is no spinal canal stenosis. No neural foraminal stenosis.  C4-5: No disc herniation. Right uncovertebral hypertrophy. There is no spinal canal stenosis. No neural foraminal stenosis.  C5-6: Small disc osteophyte complex with right uncovertebral hypertrophy. There is no spinal canal stenosis. Severe right neural foraminal stenosis.  C6-7: Intermediate sized central disc protrusion. Mild spinal canal stenosis. Moderate right neural foraminal stenosis.  C7-T1: Normal disc space and facet joints. There is no spinal canal stenosis. No neural foraminal stenosis.  MRI THORACIC SPINE FINDINGS  Alignment:  Physiologic.  Vertebrae: No fracture, evidence of discitis, or bone lesion.  Cord:  Normal signal and morphology.  Paraspinal and other soft tissues: Negative.  Disc levels:  No spinal canal stenosis. Moderate bilateral foraminal stenosis at T11-12.  IMPRESSION: 1. Mild spinal canal stenosis and moderate right neural foraminal stenosis at C6-7. 2. Severe right C5-6 neural foraminal stenosis. 3. Moderate bilateral T11-12 neural foraminal stenosis.   Electronically Signed By: Franky Stanford M.D. On: 12/28/2021 02:48  CT HIP LEFT WO  CONTRAST  Narrative CLINICAL DATA:  Left hip pain since a fall in April, 2025.  EXAM: CT OF THE LEFT HIP WITHOUT CONTRAST  TECHNIQUE: Multidetector CT imaging of the left hip was performed according to the standard protocol. Multiplanar CT image reconstructions were also generated.  RADIATION DOSE REDUCTION: This exam was performed according  to the departmental dose-optimization program which includes automated exposure control, adjustment of the mA and/or kV according to patient size and/or use of iterative reconstruction technique.  COMPARISON:  None Available.  FINDINGS: Bones/Joint/Cartilage  No acute bony or joint abnormality is identified. The patient has severe left hip osteoarthritis with bone-on-bone joint space narrowing, subchondral sclerosis, subchondral cyst formation and extensive osteophytosis. Moderate appearing left SI joint osteoarthritis is also seen. No lytic or sclerotic lesion is identified.  Ligaments  Suboptimally assessed by CT.  Muscles and Tendons  Intact with a normal CT appearance.  Soft tissues  Calcified uterine fibroid is noted.  IMPRESSION: 1. No acute abnormality. 2. Severe left hip osteoarthritis. 3. Moderate left SI joint osteoarthritis.   Electronically Signed By: Debby Prader M.D. On: 05/07/2024 10:41  DG Knee Complete 4 Views Right  Narrative CLINICAL DATA:  Fall, landed on right side.  EXAM: RIGHT KNEE - COMPLETE 4+ VIEW  COMPARISON:  None Available.  FINDINGS: Diffuse moderate to advanced degenerative disc disease throughout the right knee with joint space narrowing and spurring. No joint effusion. Chondrocalcinosis noted. No acute bony abnormality. Specifically, no fracture, subluxation, or dislocation. Soft tissues are intact.  IMPRESSION: Diffuse moderate to advanced tricompartment degenerative changes. No acute bony abnormality.   Electronically Signed By: Franky Crease M.D. On: 05/17/2023  17:44  Knee-L DG 4 views: Results for orders placed during the hospital encounter of 05/17/23  DG Knee Complete 4 Views Left  Narrative CLINICAL DATA:  Fall  EXAM: LEFT KNEE - COMPLETE 4+ VIEW  COMPARISON:  None Available.  FINDINGS: Mild degenerative changes in the left knee most pronounced in the patellofemoral compartment. No acute bony abnormality. Specifically, no fracture, subluxation, or dislocation. No joint effusion.  IMPRESSION: No acute bony abnormality.   Electronically Signed By: Franky Crease M.D. On: 05/17/2023 17:45   Complexity Note: Imaging results reviewed.                         ROS  Cardiovascular: Blood thinners:  Antiplatelet Pulmonary or Respiratory: No reported pulmonary signs or symptoms such as wheezing and difficulty taking a deep full breath (Asthma), difficulty blowing air out (Emphysema), coughing up mucus (Bronchitis), persistent dry cough, or temporary stoppage of breathing during sleep Neurological: Stroke (Residual deficits or weakness: .) and Abnormal skin sensations (Peripheral Neuropathy) Psychological-Psychiatric: No reported psychological or psychiatric signs or symptoms such as difficulty sleeping, anxiety, depression, delusions or hallucinations (schizophrenial), mood swings (bipolar disorders) or suicidal ideations or attempts Gastrointestinal: No reported gastrointestinal signs or symptoms such as vomiting or evacuating blood, reflux, heartburn, alternating episodes of diarrhea and constipation, inflamed or scarred liver, or pancreas or irrregular and/or infrequent bowel movements Genitourinary: Difficulty producing urine (Renal failure) Hematological: Weakness due to low blood hemoglobin or red blood cell count (Anemia), Brusing easily, and Bleeding easily Endocrine: High blood sugar controlled without the use of insulin  (NIDDM) Rheumatologic: Joint aches and or swelling due to excess weight (Osteoarthritis) and Generalized muscle  aches (Fibromyalgia) Musculoskeletal: Negative for myasthenia gravis, muscular dystrophy, multiple sclerosis or malignant hyperthermia Work History: Retired  Allergies  Deanna [name] is allergic to ace inhibitors, entresto  [sacubitril -valsartan ], lisinopril, peanut-containing drug products, penicillins, and tomato.  Laboratory Chemistry Profile   Renal Lab Results  Component Value Date   BUN 34 (H) 11/08/2023   CREATININE 4.80 (H) 11/08/2023   GFRAA 36 (L) 12/09/2017   GFRNONAA 9 (L) 10/11/2023   PROTEINUR 100 (A) 12/26/2021  Electrolytes Lab Results  Component Value Date   NA 138 11/08/2023   K 5.6 (H) 01/16/2024   CL 98 11/08/2023   CALCIUM  8.5 (L) 10/11/2023   MG 2.0 05/19/2023   PHOS 6.8 (H) 08/09/2023     Hepatic Lab Results  Component Value Date   AST 19 10/11/2023   ALT 21 10/11/2023   ALBUMIN  3.7 10/11/2023   ALKPHOS 130 (H) 10/11/2023     ID Lab Results  Component Value Date   HIV Non Reactive 12/27/2021   SARSCOV2NAA NEGATIVE 01/20/2022     Bone No results found for: VD25OH, CI874NY7UNU, CI6874NY7, CI7874NY7, 25OHVITD1, 25OHVITD2, 25OHVITD3, TESTOFREE, TESTOSTERONE   Endocrine Lab Results  Component Value Date   GLUCOSE 112 (H) 11/08/2023   GLUCOSEU 150 (A) 12/26/2021   HGBA1C 5.2 08/05/2023     Neuropathy Lab Results  Component Value Date   VITAMINB12 645 05/18/2023   FOLATE 9.3 05/18/2023   HGBA1C 5.2 08/05/2023   HIV Non Reactive 12/27/2021     CNS No results found for: COLORCSF, APPEARCSF, RBCCOUNTCSF, WBCCSF, POLYSCSF, LYMPHSCSF, EOSCSF, PROTEINCSF, GLUCCSF, JCVIRUS, CSFOLI, IGGCSF, LABACHR, ACETBL   Inflammation (CRP: Acute  ESR: Chronic) Lab Results  Component Value Date   CRP 9.1 (H) 01/07/2022   LATICACIDVEN 1.7 01/20/2022     Rheumatology No results found for: RF, ANA, LABURIC, URICUR, LYMEIGGIGMAB, LYMEABIGMQN, HLAB27   Coagulation Lab Results  Component Value  Date   INR 1.0 10/11/2023   LABPROT 13.5 10/11/2023   APTT 28 10/11/2023   PLT 246 10/11/2023   DDIMER 2.54 (H) 01/07/2022     Cardiovascular Lab Results  Component Value Date   BNP 4,393.1 (H) 01/20/2022   TROPONINI <0.03 12/09/2017   HGB 9.7 (L) 01/16/2024   HCT 41.0 11/08/2023     Screening Lab Results  Component Value Date   SARSCOV2NAA NEGATIVE 01/20/2022   HIV Non Reactive 12/27/2021     Cancer No results found for: CEA, CA125, LABCA2   Allergens No results found for: ALMOND, APPLE, ASPARAGUS, AVOCADO, BANANA, BARLEY, BASIL, BAYLEAF, GREENBEAN, LIMABEAN, WHITEBEAN, BEEFIGE, REDBEET, BLUEBERRY, BROCCOLI, CABBAGE, MELON, CARROT, CASEIN, CASHEWNUT, CAULIFLOWER, CELERY     Note: Lab results reviewed.  PFSH  Drug: Deanna [name]  reports no history of drug use. Alcohol:  reports no history of alcohol use. Tobacco:  reports that she has never smoked. She has never used smokeless tobacco. 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 Family: family history includes Osteoarthritis in her brother.  Past Surgical History:  Procedure Laterality Date   A/V FISTULAGRAM Right 06/28/2023   Procedure: A/V Fistulagram;  Surgeon: Marea Selinda RAMAN, MD;  Location: [facility];  Service: Cardiovascular;  Laterality: Right;   A/V FISTULAGRAM Left 01/31/2024   Procedure: A/V Fistulagram;  Surgeon: Marea Selinda RAMAN, MD;  Location: [facility];  Service: Cardiovascular;  Laterality: Left;    AV FISTULA PLACEMENT Right 05/17/2023   Procedure: INSERTION OF ARTERIOVENOUS (AV) GORE-TEX GRAFT ARM;  Surgeon: Marea Selinda RAMAN, MD;  Location: [facility];  Service: Vascular;  Laterality: Right;   CARDIAC CATHETERIZATION Left 03/01/2016   Procedure: Left Heart Cath and Coronary Angiography;  Surgeon: Vinie  DELENA Jude, MD;  Location: [facility];  Service: Cardiovascular;  Laterality: Left;   CESAREAN SECTION     DIALYSIS/PERMA CATHETER INSERTION Right 01/10/2022   Procedure: DIALYSIS/PERMA CATHETER INSERTION;  Surgeon: Tisa Curry DELENA, MD;  Location: [facility];  Service: Cardiovascular;  Laterality: Right;   DIALYSIS/PERMA CATHETER INSERTION N/A 04/12/2022   Procedure: DIALYSIS/PERMA CATHETER INSERTION;  Surgeon: Marea Selinda RAMAN, MD;  Location: [facility];  Service: Cardiovascular;  Laterality: N/A;   DIALYSIS/PERMA CATHETER REMOVAL N/A 09/03/2023   Procedure: DIALYSIS/PERMA CATHETER REMOVAL;  Surgeon: Marea Selinda RAMAN, MD;  Location: [facility];  Service: Cardiovascular;  Laterality: N/A;   INTUBATION-ENDOTRACHEAL WITH TRACHEOSTOMY STANDBY Left 01/14/2022   Procedure: INTUBATION-NASAL WITH TRACHEOSTOMY STANDBY;  Surgeon: Herminio Miu, MD;  Location: [facility];  Service: ENT;  Laterality: Left;   LOOP RECORDER INSERTION Left 08/08/2023   Procedure: LOOP RECORDER INSERTION;  Surgeon: Cindie Ole DASEN, MD;  Location: [facility];  Service: Cardiovascular;  Laterality: Left;   LOWER EXTREMITY ANGIOGRAPHY Right 07/23/2023   Procedure: Lower Extremity Angiography;  Surgeon: Marea Selinda RAMAN, MD;  Location: [facility];  Service: Cardiovascular;  Laterality: Right;   LOWER EXTREMITY ANGIOGRAPHY Left 01/03/2024   Procedure: Lower Extremity Angiography;  Surgeon: Marea Selinda RAMAN, MD;  Location: [facility];  Service: Cardiovascular;  Laterality: Left;   NERVE TRANSFER Right 11/08/2023   Procedure: ANTERIOR INTEROSSEOUS NERVE TO ULNAR NERVE TRANSFER, END  TO SIDE;  Surgeon: Claudene Penne ORN, MD;  Location: [facility];  Service: Neurosurgery;  Laterality: Right;   TEMPORARY DIALYSIS CATHETER N/A 01/04/2022   Procedure: TEMPORARY DIALYSIS CATHETER;  Surgeon: Marea Selinda RAMAN, MD;  Location: [facility];  Service: Cardiovascular;  Laterality: N/A;   ULNAR NERVE DECOMPRESSION Right 11/08/2023   Procedure: RIGHT ULNAR NERVE DECOMPRESSION;  Surgeon: Claudene Penne ORN, MD;  Location: [facility];  Service: Neurosurgery;  Laterality: Right;   Active Ambulatory Problems    Diagnosis Date Noted   Angina of effort (HCC) 03/01/2016   Essential (primary) hypertension 06/24/2014   HFrEF (heart failure with reduced ejection fraction) (HCC) 10/31/2021   Type 2 diabetes mellitus without complications (HCC) 06/24/2014   Right sided weakness 12/26/2021   Hypokalemia 12/27/2021   Acute kidney injury superimposed on chronic kidney disease (HCC) 12/27/2021   Anemia 12/27/2021   Acute CVA (cerebrovascular accident) (HCC) 12/27/2021   Lobar pneumonia (HCC) 12/27/2021   CVA (cerebral vascular accident) (HCC) 12/28/2021   Acute right flank pain    Weakness of right lower extremity    Right sided numbness    Elevated troponin 01/08/2022   Hyperkalemia 01/08/2022   Angioedema 01/14/2022   Acute respiratory failure with hypoxia and hypercapnia (HCC) 01/19/2022   Severe sepsis (HCC) 01/20/2022   SOB (shortness of breath) 01/20/2022   Chest pain 01/20/2022   Hypoglycemia associated with diabetes (HCC) 01/20/2022   End-stage renal disease on hemodialysis (HCC) 01/20/2022   Aspiration pneumonia (HCC) 01/20/2022   Shortness of breath 01/21/2022   Closed nondisplaced fracture of third cervical vertebra (HCC) 05/01/2023   Fall 05/17/2023   Anemia in ESRD (end-stage renal disease) (HCC) 05/17/2023   CAD (coronary artery disease) 05/17/2023   Leukocytosis 05/17/2023   Type II diabetes mellitus with renal manifestations (HCC) 05/18/2023   PAD (peripheral artery  disease) (HCC) 08/04/2023   Ulnar neuropathy at elbow of right upper extremity 09/05/2023   Left hip pain 05/20/2024   Primary osteoarthritis of left hip 05/20/2024  Chronic left SI joint pain 05/20/2024   SI joint arthritis (HCC) 05/20/2024   Resolved Ambulatory Problems    Diagnosis Date Noted   No Resolved Ambulatory Problems   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 In no apparent acute distress Vitals:   05/20/24 1338  BP: 137/71  Pulse: 90  Resp: 17  Temp: (!) 97.5 F (36.4 C)  SpO2: 100%  Weight: 170 lb (77.1 kg)  Height: 6' 1.5 (1.867 m)   BMI Assessment: Estimated body mass index is 22.12 kg/m as calculated from the following:   Height as of this encounter: 6' 1.5 (1.867 m).   Weight as of this encounter: 170 lb (77.1 kg).  BMI interpretation table: BMI level Category Range association with higher incidence of chronic pain  <18 kg/m2 Underweight   18.5-24.9 kg/m2 Ideal body weight   25-29.9 kg/m2 Overweight Increased incidence by 20%  30-34.9 kg/m2 Obese (Class I) Increased incidence by 68%  35-39.9 kg/m2 Severe obesity (Class II) Increased incidence by 136%  >40 kg/m2 Extreme obesity (Class III) Increased incidence by 254%   Patient's current BMI Ideal Body weight  Body mass index is 22.12 kg/m. Ideal body weight: 76.6 kg (168 lb 12.2 oz) Adjusted ideal body weight: 76.8 kg (169 lb 4.1 oz)   BMI Readings from Last 4 Encounters:  05/20/24  22.12 kg/m  03/26/24 24.60 kg/m  01/22/24 24.60 kg/m  01/03/24 20.94 kg/m   Wt Readings from Last 4 Encounters:  05/20/24 170 lb (77.1 kg)  03/26/24 189 lb (85.7 kg)  01/22/24 189 lb (85.7 kg)  01/03/24 160 lb 15 oz (73 kg)    Psych/Mental status: Alert, oriented x 3 (person, place, & time)       Eyes: PERLA Respiratory: No evidence of acute respiratory distress  Lumbar Spine Area Exam  Skin & Axial Inspection: No masses, redness, or swelling Alignment: Symmetrical Functional ROM: Pain restricted ROM       Stability: No instability detected Muscle Tone/Strength: Functionally intact. No obvious neuro-muscular anomalies detected. Sensory (Neurological): Musculoskeletal pain pattern Palpation: No palpable anomalies       Provocative Tests: Hyperextension/rotation test: deferred today       Lumbar quadrant test (Kemp's test): deferred today       Lateral bending test: deferred today       Patrick's Maneuver: (+) for left-sided S-I arthralgia             FABER* test: (+) for left-sided S-I arthralgia             S-I anterior distraction/compression test: (+) for left-sided S-I arthralgia             S-I lateral compression test: (+) for left-sided S-I arthralgia             S-I Thigh-thrust test: (+) for left-sided S-I arthralgia             S-I Gaenslen's test: (+) for  left-sided S-I arthralgia             *(Flexion, ABduction and External Rotation) Gait & Posture Assessment  Ambulation: Patient came in today in a wheel chair Gait: Significantly limited. Dependent on assistive device to ambulate Posture: Difficulty standing up straight, due to pain  Lower Extremity Exam    Side: Right lower extremity  Side: Left lower extremity  Stability: No instability observed          Stability: No instability observed          Skin & Extremity Inspection: Skin color, temperature, and hair growth are WNL. No peripheral edema or cyanosis. No masses, redness, swelling, asymmetry, or  associated skin lesions. No contractures.  Skin & Extremity Inspection: Skin color, temperature, and hair growth are WNL. No peripheral edema or cyanosis. No masses, redness, swelling, asymmetry, or associated skin lesions. No contractures.  Functional ROM: Unrestricted ROM                  Functional ROM: Pain restricted ROM for hip joint          Muscle Tone/Strength: Functionally intact. No obvious neuro-muscular anomalies detected.  Muscle Tone/Strength: Functionally intact. No obvious neuro-muscular anomalies detected.  Sensory (Neurological): Unimpaired        Sensory (Neurological): Unimpaired        DTR: Patellar: deferred today Achilles: deferred today Plantar: deferred today  DTR: Patellar: deferred today Achilles: deferred today Plantar: deferred today  Palpation: No palpable anomalies  Palpation: No palpable anomalies    Assessment  Primary Diagnosis & Pertinent Problem List: The primary encounter diagnosis was Left hip pain. Diagnoses of Primary osteoarthritis of left hip, Chronic left SI joint pain, and SI joint arthritis (HCC) were also pertinent to this visit.  Visit Diagnosis (New problems to examiner): 1. Left hip pain   2. Primary osteoarthritis of left hip   3. Chronic left SI joint pain   4. SI joint arthritis (HCC)    Plan of Care (Initial workup plan)  Assessment and Plan    Left hip osteoarthritis with chronic pain   Chronic left hip pain is due to severe osteoarthritis, worsened by a fall in April. The pain is severe and limits weight-bearing on the left side. She is a candidate for hip replacement surgery, pending specialist clearance. Currently on Plavix , she has not had previous hip surgery or injections and is hesitant about injections due to fear of needles and difficulty lying on her stomach. Injections are proposed as a temporary pain management measure until surgery. There is concern that steroid injections may delay surgery if the surgeon prefers no  steroids in the joint prior to operation. Obtain insurance approval for a hip injection. Coordinate with the orthopedic surgeon to determine if a steroid injection is permissible before surgery. Schedule the hip injection in two weeks, contingent on the surgeon's approval.  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 Injections are considered to provide temporary relief until. Obtain insurance approval for an SI joint injection and schedule it in two weeks.        Procedure Orders         SACROILIAC JOINT INJECTION         HIP INJECTION      Provider-requested follow-up: Return in about 22 days (around 06/11/2024) for Left hip joint and left SIJ, in  clinic [facility] (continue Plavix ).  Future Appointments  Date Time Provider Department Center  06/09/2024  7:45 AM [facility] DEVICE REMOTES [facility]  07/10/2024  7:40 AM [facility] DEVICE [facility]OT[facility]  08/11/2024  7:00 AM [facility] DEVICE [facility]OT[facility]  09/11/2024  7:05 AM [facility] DEVICE [facility]OT[facility]  09/23/2024  1:00 PM [facility] VASC 2 [facility] None  09/23/2024  1:30 PM [facility] 2 [facility] None  09/23/2024  2:00 PM Pace, Brien R, NP [facility] None   I discussed the assessment and treatment plan with the patient. The patient was provided an opportunity to ask questions and all were answered. The patient agreed with the plan and demonstrated an understanding of the instructions.  Patient advised to call back or seek an in-person evaluation if the symptoms or condition worsens.  Duration of encounter: .  Total time on encounter, as per AMA guidelines included both the face-to-face and non-face-to-face time personally spent by the physician and/or other qualified health care professional(s) on the day of the encounter (includes time in activities that require the  physician or other qualified health care professional and does not include time in activities normally performed by clinical staff). Physician's time may include the following activities when performed: Preparing to see the patient (e.g., pre-charting review of records, searching for previously ordered imaging, lab work, and nerve conduction tests) Review of prior analgesic pharmacotherapies. Reviewing PMP Interpreting ordered tests (e.g., lab work, imaging, nerve conduction tests) Performing post-procedure evaluations, including interpretation of diagnostic procedures Obtaining and/or reviewing separately obtained history Performing a medically appropriate examination and/or evaluation Counseling and educating the patient/family/caregiver Ordering medications, tests, or procedures Referring and communicating with other health care professionals (when not separately reported) Documenting clinical information in the electronic or other health record Independently interpreting results (not separately reported) and communicating results to the patient/ family/caregiver Care coordination (not separately reported)  Note by: Wallie Sherry, MD (TTS and AI technology used. I apologize for any typographical errors that were not detected and corrected.) Date: 05/20/2024; Time: 2:53 PM

## 2024-06-09 ENCOUNTER — Ambulatory Visit (INDEPENDENT_AMBULATORY_CARE_PROVIDER_SITE_OTHER): Payer: Self-pay

## 2024-06-09 DIAGNOSIS — I70219 Atherosclerosis of native arteries of extremities with intermittent claudication, unspecified extremity: Secondary | ICD-10-CM | POA: Diagnosis not present

## 2024-06-10 LAB — CUP PACEART REMOTE DEVICE CHECK
Date Time Interrogation Session: 20250914235728
Implantable Pulse Generator Implant Date: 20241113

## 2024-06-12 ENCOUNTER — Ambulatory Visit: Admitting: Student in an Organized Health Care Education/Training Program

## 2024-06-12 ENCOUNTER — Ambulatory Visit
Admission: RE | Admit: 2024-06-12 | Discharge: 2024-06-12 | Disposition: A | Discharge status: Home / Self Care | Source: Ambulatory Visit | Attending: Student in an Organized Health Care Education/Training Program | Admitting: Student in an Organized Health Care Education/Training Program

## 2024-06-12 ENCOUNTER — Encounter: Payer: Self-pay | Admitting: Student in an Organized Health Care Education/Training Program

## 2024-06-12 ENCOUNTER — Ambulatory Visit: Payer: Self-pay | Admitting: Cardiology

## 2024-06-12 VITALS — BP 144/73 | HR 100 | Temp 97.3°F | Resp 16 | Ht 73.5 in | Wt 160.0 lb

## 2024-06-12 DIAGNOSIS — M25552 Pain in left hip: Secondary | ICD-10-CM

## 2024-06-12 DIAGNOSIS — G8929 Other chronic pain: Secondary | ICD-10-CM | POA: Diagnosis present

## 2024-06-12 DIAGNOSIS — M461 Sacroiliitis, not elsewhere classified: Secondary | ICD-10-CM | POA: Insufficient documentation

## 2024-06-12 DIAGNOSIS — M533 Sacrococcygeal disorders, not elsewhere classified: Secondary | ICD-10-CM | POA: Insufficient documentation

## 2024-06-12 DIAGNOSIS — M1612 Unilateral primary osteoarthritis, left hip: Secondary | ICD-10-CM | POA: Diagnosis not present

## 2024-06-12 MED ORDER — ROPIVACAINE HCL 2 MG/ML IJ SOLN
18.0000 mL | Freq: Once | INTRAMUSCULAR | Status: AC
  Administered 2024-06-12: 9 mL via PERINEURAL

## 2024-06-12 MED ORDER — IOHEXOL 180 MG/ML  SOLN
INTRAMUSCULAR | Status: AC
  Filled 2024-06-12: qty 20

## 2024-06-12 MED ORDER — METHYLPREDNISOLONE ACETATE 40 MG/ML IJ SUSP
40.0000 mg | Freq: Once | INTRAMUSCULAR | Status: AC
  Administered 2024-06-12: 40 mg via INTRA_ARTICULAR

## 2024-06-12 MED ORDER — IOHEXOL 180 MG/ML  SOLN
10.0000 mL | Freq: Once | INTRAMUSCULAR | Status: AC
  Administered 2024-06-12: 10 mL via INTRA_ARTICULAR

## 2024-06-12 MED ORDER — LIDOCAINE HCL (PF) 2 % IJ SOLN
INTRAMUSCULAR | Status: AC
  Filled 2024-06-12: qty 10

## 2024-06-12 MED ORDER — METHYLPREDNISOLONE ACETATE 40 MG/ML IJ SUSP
40.0000 mg | Freq: Once | INTRAMUSCULAR | Status: AC
  Administered 2024-06-12: 40 mg

## 2024-06-12 MED ORDER — LIDOCAINE HCL 2 % IJ SOLN
20.0000 mL | Freq: Once | INTRAMUSCULAR | Status: AC
  Administered 2024-06-12: 200 mg

## 2024-06-12 MED ORDER — ROPIVACAINE HCL 2 MG/ML IJ SOLN
INTRAMUSCULAR | Status: AC
  Filled 2024-06-12: qty 20

## 2024-06-12 MED ORDER — METHYLPREDNISOLONE ACETATE 40 MG/ML IJ SUSP
INTRAMUSCULAR | Status: AC
  Filled 2024-06-12: qty 2

## 2024-06-12 NOTE — Progress Notes (Signed)
 Safety precautions to be maintained throughout the outpatient stay will include: orient to surroundings, keep bed in low position, maintain call bell within reach at all times, provide assistance with transfer out of bed and ambulation.

## 2024-06-12 NOTE — Progress Notes (Signed)
 PROVIDER NOTE: Interpretation of information contained herein should be left to medically-trained personnel. Specific patient instructions are provided elsewhere under Patient Instructions section of medical record. This document was created in part using STT-dictation technology, any transcriptional errors that may result from this process are unintentional.  Patient: Deanna [name] Type: Established DOB: [birth year] MRN: [id number] PCP: Brutus Delon SAUNDERS, NP  Service: Procedure DOS: 06/12/2024 Setting: Ambulatory Location: Ambulatory outpatient facility Delivery: Face-to-face Provider: Wallie Sherry, MD Specialty: Interventional Pain Management Specialty designation: 09 Location: Outpatient facility Ref. Prov.: Brutus Delon SAUNDERS, NP       Interventional Therapy   Primary Reason for Visit: Interventional Pain Management Treatment. CC: Hip Pain    Procedure:          Anesthesia, Analgesia, Anxiolysis:  Type: Hip bursa injection #1 under ultrasound guidance Primary Purpose: Diagnostic Region: Upper (proximal) Femoral Region Level: Hip Joint Target Area: Trochanteric Bursa Approach: posterolateral approach Laterality: Left  Type: Local Anesthesia Local Anesthetic: Lidocaine  1-2% Sedation: None  Indication(s):  Analgesia Route: Infiltration ([place]/IM) IV Access: N/A   Position: Lateral Decubitus with bad side up   1. Left hip pain   2. Primary osteoarthritis of left hip   3. Chronic left SI joint pain   4. SI joint arthritis (HCC)    NAS-11 Pain score:   Pre-procedure: 10-Worst pain ever/10   Post-procedure: 6/10    Patient has significant difficulty getting into position.  Given her inability to lay on her abdomen in the prone position, we aborted the SI joint injection and focused on her left hip pain specifically her bursa pain.  We performed a left hip bursa injection under ultrasound guidance.   H&P (Pre-op  Assessment):  Deanna [name] is a 73 y.o. (year old), female  patient, seen today for interventional treatment. She  has a past surgical history that includes Cardiac catheterization (Left, 03/01/2016); TEMPORARY DIALYSIS CATHETER (N/A, 01/04/2022); DIALYSIS/PERMA CATHETER INSERTION (Right, 01/10/2022); Intubation-endotracheal with tracheostomy standby (Left, 01/14/2022); DIALYSIS/PERMA CATHETER INSERTION (N/A, 04/12/2022); Cesarean section; AV fistula placement (Right, 05/17/2023); A/V Fistulagram (Right, 06/28/2023); Lower Extremity Angiography (Right, 07/23/2023); LOOP RECORDER INSERTION (Left, 08/08/2023); DIALYSIS/PERMA CATHETER REMOVAL (N/A, 09/03/2023); Ulnar nerve decompression (Right, 11/08/2023); Nerve transfer (Right, 11/08/2023); Lower Extremity Angiography (Left, 01/03/2024); and A/V Fistulagram (Left, 01/31/2024). Deanna [name] has a current medication list which includes the following prescription(s): ammonium lactate, aspirin  ec, atorvastatin , calcium  acetate, clopidogrel , ferrous sulfate , gabapentin , biofreeze cool the pain, metoprolol  tartrate, midodrine , ondansetron , oxycodone -acetaminophen , sertraline, and vitamin d (ergocalciferol). Her primarily concern today is the Hip Pain  Initial Vital Signs:  Pulse/HCG Rate: 96  Temp: (!) 97.3 F (36.3 C) Resp: 18 BP: 131/71 SpO2: 99 %  BMI: Estimated body mass index is 20.82 kg/m as calculated from the following:   Height as of this encounter: 6' 1.5 (1.867 m).   Weight as of this encounter: 160 lb (72.6 kg).  Risk Assessment: Allergies: Reviewed. She is allergic to ace inhibitors, entresto  [sacubitril -valsartan ], lisinopril, peanut-containing drug products, penicillins, and tomato.  Allergy Precautions: None required Coagulopathies: Reviewed. None identified.  Blood-thinner therapy: None at this time Active Infection(s): Reviewed. None identified. Deanna [name] is afebrile  Site Confirmation: Deanna [name] was asked to confirm the procedure and laterality before marking the site Procedure checklist:  Completed Consent: Before the procedure and under the influence of no sedative(s), amnesic(s), or anxiolytics, the patient was informed of the treatment options, risks and possible complications. To fulfill our ethical and legal obligations, as recommended by the American Medical Association's Code of Ethics, I have  informed the patient of my clinical impression; the nature and purpose of the treatment or procedure; the risks, benefits, and possible complications of the intervention; the alternatives, including doing nothing; the risk(s) and benefit(s) of the alternative treatment(s) or procedure(s); and the risk(s) and benefit(s) of doing nothing. The patient was provided information about the general risks and possible complications associated with the procedure. These may include, but are not limited to: failure to achieve desired goals, infection, bleeding, organ or nerve damage, allergic reactions, paralysis, and death. In addition, the patient was informed of those risks and complications associated to the procedure, such as failure to decrease pain; infection; bleeding; organ or nerve damage with subsequent damage to sensory, motor, and/or autonomic systems, resulting in permanent pain, numbness, and/or weakness of one or several areas of the body; allergic reactions; (i.e.: anaphylactic reaction); and/or death. Furthermore, the patient was informed of those risks and complications associated with the medications. These include, but are not limited to: allergic reactions (i.e.: anaphylactic or anaphylactoid reaction(s)); adrenal axis suppression; blood sugar elevation that in diabetics may result in ketoacidosis or comma; water retention that in patients with history of congestive heart failure may result in shortness of breath, pulmonary edema, and decompensation with resultant heart failure; weight gain; swelling or edema; medication-induced neural toxicity; particulate matter embolism and blood vessel  occlusion with resultant organ, and/or nervous system infarction; and/or aseptic necrosis of one or more joints. Finally, the patient was informed that Medicine is not an exact science; therefore, there is also the possibility of unforeseen or unpredictable risks and/or possible complications that may result in a catastrophic outcome. The patient indicated having understood very clearly. We have given the patient no guarantees and we have made no promises. Enough time was given to the patient to ask questions, all of which were answered to the patient's satisfaction. Ms. Gentry has indicated that she wanted to continue with the procedure. Attestation: I, the ordering provider, attest that I have discussed with the patient the benefits, risks, side-effects, alternatives, likelihood of achieving goals, and potential problems during recovery for the procedure that I have provided informed consent. Date  Time: 06/12/2024  1:23 PM  Pre-Procedure Preparation:  Monitoring: As per clinic protocol. Respiration, ETCO2, SpO2, BP, heart rate and rhythm monitor placed and checked for adequate function Safety Precautions: Patient was assessed for positional comfort and pressure points before starting the procedure. Time-out: I initiated and conducted the Time-out before starting the procedure, as per protocol. The patient was asked to participate by confirming the accuracy of the Time Out information. Verification of the correct person, site, and procedure were performed and confirmed by me, the nursing staff, and the patient. Time-out conducted as per Joint Commission's Universal Protocol (UP.01.01.01). Time: 1411 Start Time: 1411 hrs.  Description of Procedure:          Area Prepped: Entire Posterolateral hip area. ChloraPrep (2% chlorhexidine  gluconate and 70% isopropyl alcohol) Safety Precautions: Aspiration looking for blood return was conducted prior to all injections. At no point did we inject any  substances, as a needle was being advanced. No attempts were made at seeking any paresthesias. Safe injection practices and needle disposal techniques used. Medications properly checked for expiration dates. SDV (single dose vial) medications used. Description of the Procedure: Protocol guidelines were followed. The patient was placed in position over the procedure table. The target area was identified and the area prepped in the usual manner. Skin & deeper tissues infiltrated with local anesthetic. Appropriate amount of  time allowed to pass for local anesthetics to take effect. The procedure needles were then advanced to the target area. Proper needle placement secured. Negative aspiration confirmed. Solution injected in intermittent fashion, asking for systemic symptoms every 0.5cc of injectate. The needles were then removed and the area cleansed, making sure to leave some of the prepping solution back to take advantage of its long term bactericidal properties. Vitals:   06/12/24 1329 06/12/24 1410 06/12/24 1416 06/12/24 1421  BP: 131/71 (!) 148/71 (!) 147/78 (!) 144/73  Pulse: 96 (!) 102 99 100  Resp: 18 16 16 16   Temp: (!) 97.3 F (36.3 C)     TempSrc: Temporal     SpO2: 99% 100% 99% 99%  Weight: 160 lb (72.6 kg)     Height: 6' 1.5 (1.867 m)       Start Time: 1411 hrs. End Time: 1425 hrs.           Materials:  Needle(s) Type: Spinal Needle Gauge: 22G Length: 3.5-in Medication(s): Please see orders for medications and dosing details.  Imaging Guidance (Non-Spinal):          Type of Imaging Technique: Ultrasound guidance   Antibiotic Prophylaxis:   Anti-infectives (From admission, onward)    None      Indication(s): None identified  Post-operative Assessment:  Post-procedure Vital Signs:  Pulse/HCG Rate: 100  Temp: (!) 97.3 F (36.3 C) Resp: 16 BP: (!) 144/73 SpO2: 99 %  EBL: None  Complications: No immediate post-treatment complications observed by team, or  reported by patient.  Note: The patient tolerated the entire procedure well. A repeat set of vitals were taken after the procedure and the patient was kept under observation following institutional policy, for this type of procedure. Post-procedural neurological assessment was performed, showing return to baseline, prior to discharge. The patient was provided with post-procedure discharge instructions, including a section on how to identify potential problems. Should any problems arise concerning this procedure, the patient was given instructions to immediately contact us , at any time, without hesitation. In any case, we plan to contact the patient by telephone for a follow-up status report regarding this interventional procedure.  Comments:  No additional relevant information.  Plan of Care (POC)  Orders:  Orders Placed This Encounter  Procedures   DG PAIN CLINIC C-ARM 1-60 MIN NO REPORT    Intraoperative interpretation by procedural physician at [facility].    Standing Status:   Standing    Number of Occurrences:   1    Reason for exam::   Assistance in needle guidance and placement for procedures requiring needle placement in or near specific anatomical locations not easily accessible without such assistance.     Medications ordered for procedure: Meds ordered this encounter  Medications   iohexol  (OMNIPAQUE ) 180 MG/ML injection 10 mL    Must be Myelogram-compatible. If not available, you may substitute with a water-soluble, non-ionic, hypoallergenic, myelogram-compatible radiological contrast medium.   lidocaine  (XYLOCAINE ) 2 % (with pres) injection 400 mg   methylPREDNISolone  acetate (DEPO-MEDROL ) injection 40 mg   methylPREDNISolone  acetate (DEPO-MEDROL ) injection 40 mg   ropivacaine  (PF) 2 mg/mL (0.2%) (NAROPIN ) injection 18 mL   Medications administered: We administered iohexol , lidocaine , methylPREDNISolone  acetate, methylPREDNISolone  acetate, and ropivacaine  (PF) 2  mg/mL (0.2%).  See the medical record for exact dosing, route, and time of administration.    Follow-up plan:   Return in about 6 weeks (around 07/24/2024) for PPE VV.     Recent Visits Date Type Provider  Dept  05/20/24 Office Visit Marcelino Nurse, MD [facility]  Showing recent visits within past 90 days and meeting all other requirements Today's Visits Date Type Provider Dept  06/12/24 Procedure visit Marcelino Nurse, MD [facility]  Showing today's visits and meeting all other requirements Future Appointments Date Type Provider Dept  07/24/24 Appointment Marcelino Nurse, MD [facility]  Showing future appointments within next 90 days and meeting all other requirements   Disposition: Discharge home  Discharge (Date  Time): 06/12/2024; 1425 hrs.   Primary Care Physician: Brutus Delon SAUNDERS, NP Location: [facility] Note by: Nurse Marcelino, MD (TTS technology used. I apologize for any typographical errors that were not detected and corrected.) Date: 06/12/2024; Time: 3:17 PM  Disclaimer:  Medicine is not an Visual merchandiser. The only guarantee in medicine is that nothing is guaranteed. It is important to note that the decision to proceed with this intervention was based on the information collected from the patient. The Data and conclusions were drawn from the patient's questionnaire, the interview, and the physical examination. Because the information was provided in large part by the patient, it cannot be guaranteed that it has not been purposely or unconsciously manipulated. Every effort has been made to obtain as much relevant data as possible for this evaluation. It is important to note that the conclusions that lead to this procedure are derived in large part from the available data. Always take into account that the treatment will also be dependent on availability of resources and existing treatment guidelines, considered by other  Pain Management Practitioners as being common knowledge and practice, at the time of the intervention. For Medico-Legal purposes, it is also important to point out that variation in procedural techniques and pharmacological choices are the acceptable norm. The indications, contraindications, technique, and results of the above procedure should only be interpreted and judged by a Board-Certified Interventional Pain Specialist with extensive familiarity and expertise in the same exact procedure and technique.

## 2024-06-12 NOTE — Patient Instructions (Signed)

## 2024-06-16 NOTE — Progress Notes (Signed)
 Remote Loop Recorder Transmission

## 2024-06-18 ENCOUNTER — Other Ambulatory Visit (INDEPENDENT_AMBULATORY_CARE_PROVIDER_SITE_OTHER): Payer: Self-pay | Admitting: Nurse Practitioner

## 2024-06-18 DIAGNOSIS — N186 End stage renal disease: Secondary | ICD-10-CM

## 2024-06-18 NOTE — Progress Notes (Signed)
 Remote Loop Recorder Transmission

## 2024-06-19 ENCOUNTER — Ambulatory Visit (INDEPENDENT_AMBULATORY_CARE_PROVIDER_SITE_OTHER)

## 2024-06-19 DIAGNOSIS — N186 End stage renal disease: Secondary | ICD-10-CM | POA: Diagnosis not present

## 2024-06-24 ENCOUNTER — Encounter (INDEPENDENT_AMBULATORY_CARE_PROVIDER_SITE_OTHER): Payer: Self-pay | Admitting: Nurse Practitioner

## 2024-06-24 ENCOUNTER — Ambulatory Visit (INDEPENDENT_AMBULATORY_CARE_PROVIDER_SITE_OTHER): Admitting: Nurse Practitioner

## 2024-06-24 VITALS — BP 147/86 | HR 71 | Resp 18 | Ht 73.0 in | Wt 160.0 lb

## 2024-06-24 DIAGNOSIS — E119 Type 2 diabetes mellitus without complications: Secondary | ICD-10-CM | POA: Diagnosis not present

## 2024-06-24 DIAGNOSIS — I10 Essential (primary) hypertension: Secondary | ICD-10-CM | POA: Diagnosis not present

## 2024-06-24 DIAGNOSIS — N186 End stage renal disease: Secondary | ICD-10-CM

## 2024-06-24 NOTE — Progress Notes (Signed)
 Subjective:    Patient ID: Deanna [name], female    DOB: 1950-12-14, 73 y.o.   MRN: [id number] Chief Complaint  Patient presents with   Follow-up    HDA consult, elevated pressures + prolonged bleeding ref.lateef US  done 06/19/24    The patient returns to the office for follow up regarding a problem with their dialysis access.   The patient notes a significant increase in problems with dialysis.  It is reported that adequate dialysis is not being achieved.    The patient denies hand pain or other symptoms consistent with steal phenomena.  No significant arm swelling.  The patient denies redness or swelling at the access site. The patient denies fever or chills at home or while on dialysis.  No recent shortening of the patient's walking distance or new symptoms consistent with claudication.  No history of rest pain symptoms. No new ulcers or wounds of the lower extremities have occurred.  The patient denies amaurosis fugax or recent TIA symptoms. There are no recent neurological changes noted. There is no history of DVT, PE or superficial thrombophlebitis. No recent episodes of angina or shortness of breath documented.   Duplex ultrasound of the AV access shows a patent access.  The previously noted stenosis is significantly increased compared to last study.  Flow volume today is 299 cc/min (previous flow volume was 814 cc/min)    Review of Systems  Neurological:  Positive for weakness.  Hematological:  Bruises/bleeds easily.  All other systems reviewed and are negative.      Objective:   Physical Exam Vitals reviewed.  HENT:     Head: Normocephalic.  Cardiovascular:     Rate and Rhythm: Normal rate.  Pulmonary:     Effort: Pulmonary effort is normal.  Skin:    General: Skin is warm and dry.  Neurological:     Mental Status: She is alert and oriented to person, place, and time.  Psychiatric:        Mood and Affect: Mood normal.        Behavior: Behavior normal.         Thought Content: Thought content normal.        Judgment: Judgment normal.     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   required intubation   Anemia    C3 cervical fracture (HCC) 04/2023   Cervical stress fracture    CHF (congestive heart failure) (HCC)    combined systolic and diastolic   Coronary artery disease    ESRD on dialysis (HCC)    M-W-Fr   GERD (gastroesophageal reflux disease)    Hyperlipidemia    Hypertension    Pneumonia    Secondary hyperparathyroidism of renal origin    Stroke [facility])    with right sided weakness   Type 2 diabetes mellitus with diabetic neuropathy (HCC)    Ulnar neuropathy at elbow of right upper extremity     Social History   Socioeconomic History   Marital status: Single    Spouse name: Not on file   Number of children: 3   Years of education: Not on file   Highest education level: Not on file  Occupational History   Not on file  Tobacco Use   Smoking status: Never   Smokeless tobacco: Never  Vaping Use   Vaping status: Never Used  Substance and Sexual Activity   Alcohol use: No   Drug use: No   Sexual activity: Not Currently  Other Topics Concern   Not on file  Social History Narrative   Temporarily staying  at [facility].   Social Drivers of Corporate investment banker Strain: Not on file  Food Insecurity: No Food Insecurity (08/04/2023)   Hunger Vital Sign    Worried About Running Out of Food in the Last Year: Never true    Ran Out of Food in the Last Year: Never true  Transportation Needs: No Transportation Needs (08/04/2023)   PRAPARE - Administrator, Civil Service (Medical): No    Lack of Transportation (Non-Medical): No  Physical Activity: Not on file  Stress: Not on file  Social  Connections: Not on file  Intimate Partner Violence: Not At Risk (08/04/2023)   Humiliation, Afraid, Rape, and Kick questionnaire    Fear of Current or Ex-Partner: No    Emotionally Abused: No    Physically Abused: No    Sexually Abused: No    Past Surgical History:  Procedure Laterality Date   A/V FISTULAGRAM Right 06/28/2023   Procedure: A/V Fistulagram;  Surgeon: Marea Selinda RAMAN, MD;  Location: [facility];  Service: Cardiovascular;  Laterality: Right;   A/V FISTULAGRAM Left 01/31/2024   Procedure: A/V Fistulagram;  Surgeon: Marea Selinda RAMAN, MD;  Location: [facility];  Service: Cardiovascular;  Laterality: Left;   AV FISTULA PLACEMENT Right 05/17/2023   Procedure: INSERTION OF ARTERIOVENOUS (AV) GORE-TEX GRAFT ARM;  Surgeon: Marea Selinda RAMAN, MD;  Location: [facility];  Service: Vascular;  Laterality: Right;   CARDIAC CATHETERIZATION Left 03/01/2016   Procedure: Left Heart Cath and Coronary Angiography;  Surgeon: Vinie DELENA Jude, MD;  Location: [facility];  Service: Cardiovascular;  Laterality: Left;   CESAREAN SECTION     DIALYSIS/PERMA CATHETER INSERTION Right 01/10/2022   Procedure: DIALYSIS/PERMA CATHETER INSERTION;  Surgeon: Tisa Curry DELENA, MD;  Location: [facility];  Service: Cardiovascular;  Laterality: Right;   DIALYSIS/PERMA CATHETER INSERTION N/A 04/12/2022   Procedure: DIALYSIS/PERMA CATHETER INSERTION;  Surgeon: Marea Selinda RAMAN, MD;  Location: [facility];  Service: Cardiovascular;  Laterality: N/A;   DIALYSIS/PERMA CATHETER REMOVAL N/A 09/03/2023   Procedure: DIALYSIS/PERMA CATHETER REMOVAL;  Surgeon: Marea Selinda RAMAN, MD;  Location: [facility];  Service: Cardiovascular;  Laterality: N/A;   INTUBATION-ENDOTRACHEAL WITH TRACHEOSTOMY STANDBY Left 01/14/2022   Procedure: INTUBATION-NASAL WITH TRACHEOSTOMY STANDBY;  Surgeon: Herminio Miu, MD;  Location: [facility];  Service: ENT;  Laterality: Left;   LOOP RECORDER INSERTION Left 08/08/2023    Procedure: LOOP RECORDER INSERTION;  Surgeon: Cindie Ole DASEN, MD;  Location: [facility];  Service: Cardiovascular;  Laterality: Left;   LOWER EXTREMITY ANGIOGRAPHY Right 07/23/2023   Procedure: Lower Extremity Angiography;  Surgeon: Marea Selinda RAMAN, MD;  Location: [facility];  Service: Cardiovascular;  Laterality: Right;   LOWER EXTREMITY ANGIOGRAPHY Left 01/03/2024   Procedure: Lower Extremity Angiography;  Surgeon: Marea Selinda RAMAN, MD;  Location: [facility];  Service: Cardiovascular;  Laterality: Left;   NERVE TRANSFER Right 11/08/2023   Procedure: ANTERIOR INTEROSSEOUS NERVE TO ULNAR NERVE TRANSFER, END TO SIDE;  Surgeon: Claudene Penne ORN, MD;  Location: [facility];  Service: Neurosurgery;  Laterality: Right;  TEMPORARY DIALYSIS CATHETER N/A 01/04/2022   Procedure: TEMPORARY DIALYSIS CATHETER;  Surgeon: Marea Selinda RAMAN, MD;  Location: [facility];  Service: Cardiovascular;  Laterality: N/A;   ULNAR NERVE DECOMPRESSION Right 11/08/2023   Procedure: RIGHT ULNAR NERVE DECOMPRESSION;  Surgeon: Claudene Penne ORN, MD;  Location: [facility];  Service: Neurosurgery;  Laterality: Right;    Family History  Problem Relation Age of Onset   Osteoarthritis Brother     Allergies  Allergen Reactions   Ace Inhibitors Swelling   Entresto  [Sacubitril -Valsartan ] Swelling    Patient said she took both lisinopril and Entresto  so it is not clear which one she actually reacted to.  For safety reasons, she has been advised to avoid both ACE inhibitors and Entresto .   Lisinopril Swelling    Severe Angioedema (requiring Nasotracheal intubation)   Peanut-Containing Drug Products    Penicillins Rash   Tomato Rash and Other (See Comments)       Latest Ref Rng & Units 01/16/2024    5:59 AM 11/08/2023    6:49 AM 10/11/2023    2:52 PM  CBC  WBC 4.0 - 10.5 K/uL   7.7   Hemoglobin 12.0 - 15.0 g/dL 9.7  86.0  89.3   Hematocrit 36.0 - 46.0 %  41.0  33.9   Platelets 150 - 400 K/uL   246        CMP     Component Value Date/Time   NA 138 11/08/2023 0649   K 5.6 (H) 01/16/2024 0559   CL 98 11/08/2023 0649   CO2 26 10/11/2023 1452   GLUCOSE 112 (H) 11/08/2023 0649   BUN 34 (H) 11/08/2023 0649   CREATININE 4.80 (H) 11/08/2023 0649   CALCIUM  8.5 (L) 10/11/2023 1452   PROT 7.9 10/11/2023 1452   ALBUMIN  3.7 10/11/2023 1452   AST 19 10/11/2023 1452   ALT 21 10/11/2023 1452   ALKPHOS 130 (H) 10/11/2023 1452   BILITOT 0.5 10/11/2023 1452   GFRNONAA 9 (L) 10/11/2023 1452     No results found.     Assessment & Plan:   1. ESRD (end stage renal disease) (HCC) (Primary) Recommend:  The patient is experiencing increasing problems with their dialysis access.  Patient should have a fistulagram with the intention for intervention.  The intention for intervention is to restore appropriate flow and prevent thrombosis and possible loss of the access.  As well as improve the quality of dialysis therapy.  The risks, benefits and alternative therapies were reviewed in detail with the patient.  All questions were answered.  The patient agrees to proceed with angio/intervention.    The patient will follow up with me in the office after the procedure.   2. Essential (primary) hypertension Continue antihypertensive medications as already ordered, these medications have been reviewed and there are no changes at this time.  3. Type 2 diabetes mellitus without complication, without long-term current use of insulin  (HCC) Continue hypoglycemic medications as already ordered, these medications have been reviewed and there are no changes at this time.  Hgb A1C to be monitored as already arranged by primary service   Current Outpatient Medications on File Prior to Visit  Medication Sig Dispense Refill   ammonium lactate (AMLACTIN) 12 % cream Apply topically.     aspirin  EC 81 MG tablet Take 81 mg by mouth daily.     atorvastatin  (LIPITOR) 40 MG tablet Take 40 mg by mouth daily.      calcium  acetate (PHOSLO) 667 MG capsule  Take 1,334 mg by mouth 3 (three) times daily.     clopidogrel  (PLAVIX ) 75 MG tablet Take 75 mg by mouth at bedtime.     gabapentin  (NEURONTIN ) 100 MG capsule Take 100 mg by mouth 2 (two) times daily as needed.     Menthol, Topical Analgesic, (BIOFREEZE COOL THE PAIN) 4 % GEL Apply topically.     metoprolol  tartrate (LOPRESSOR ) 25 MG tablet Take 1 tablet (25 mg total) by mouth 2 (two) times daily. 60 tablet 0   midodrine  (PROAMATINE ) 5 MG tablet Take 5 mg by mouth 2 (two) times daily with a meal.     ondansetron  (ZOFRAN -ODT) 4 MG disintegrating tablet      oxyCODONE -acetaminophen  (PERCOCET) 5-325 MG tablet Take 1 tablet by mouth every 4 (four) hours as needed for severe pain (pain score 7-10). 5 tablet 0   sertraline (ZOLOFT) 25 MG tablet Take 25 mg by mouth daily.     Vitamin D, Ergocalciferol, (DRISDOL) 1.25 MG (50000 UNIT) CAPS capsule Take 50,000 Units by mouth once a week. Wednesday     ferrous sulfate  325 (65 FE) MG EC tablet Take 325 mg by mouth in the morning. (Patient not taking: Reported on 06/24/2024)     No current facility-administered medications on file prior to visit.    There are no Patient Instructions on file for this visit. No follow-ups on file.   Orville Widmann E Luis Sami, NP

## 2024-06-25 ENCOUNTER — Telehealth (INDEPENDENT_AMBULATORY_CARE_PROVIDER_SITE_OTHER): Payer: Self-pay

## 2024-06-25 NOTE — Telephone Encounter (Signed)
 Spoke with Tammy the coordinator for the patient and she is scheduled with Dr. Marea for a right arm fistulagram on 06/26/24 with a 8:30 am arrival time. Pre-procedure instructions were discussed and Tammy stated she wrote them down.

## 2024-06-25 NOTE — Telephone Encounter (Signed)
 Patient's caregiver was called and advised to bring the patient to the [facility] at 11:00 am instead.

## 2024-06-26 ENCOUNTER — Other Ambulatory Visit: Payer: Self-pay

## 2024-06-26 ENCOUNTER — Encounter
Admission: RE | Disposition: A | Discharge status: Skilled Nursing Facility | Payer: Self-pay | Source: Home / Self Care | Attending: Vascular Surgery

## 2024-06-26 ENCOUNTER — Ambulatory Visit
Admission: RE | Admit: 2024-06-26 | Discharge: 2024-06-26 | Disposition: A | Discharge status: Skilled Nursing Facility | Attending: Vascular Surgery | Admitting: Vascular Surgery

## 2024-06-26 DIAGNOSIS — Z539 Procedure and treatment not carried out, unspecified reason: Secondary | ICD-10-CM | POA: Insufficient documentation

## 2024-06-26 DIAGNOSIS — N186 End stage renal disease: Secondary | ICD-10-CM | POA: Diagnosis present

## 2024-06-26 LAB — POTASSIUM: Potassium: 5.2 mmol/L — ABNORMAL HIGH (ref 3.5–5.1)

## 2024-06-26 LAB — POTASSIUM (ARMC VASCULAR LAB ONLY): Potassium (ARMC vascular lab): 6.1 mmol/L — ABNORMAL HIGH (ref 3.5–5.1)

## 2024-06-26 LAB — GLUCOSE, CAPILLARY
Glucose-Capillary: 73 mg/dL (ref 70–99)
Glucose-Capillary: 95 mg/dL (ref 70–99)

## 2024-06-26 SURGERY — A/V FISTULAGRAM
Anesthesia: Moderate Sedation | Laterality: Right

## 2024-06-26 MED ORDER — FAMOTIDINE 20 MG PO TABS: 40.0000 mg | ORAL_TABLET | Freq: Once | ORAL | Status: DC | PRN

## 2024-06-26 MED ORDER — VANCOMYCIN HCL IN DEXTROSE 1-5 GM/200ML-% IV SOLN
1000.0000 mg | INTRAVENOUS | Status: AC
  Administered 2024-06-26: 1000 mg via INTRAVENOUS
  Filled 2024-06-26: qty 200

## 2024-06-26 MED ORDER — DIPHENHYDRAMINE HCL 50 MG/ML IJ SOLN: 50.0000 mg | Freq: Once | INTRAMUSCULAR | Status: DC | PRN

## 2024-06-26 MED ORDER — MIDAZOLAM HCL 2 MG/ML PO SYRP: 8.0000 mg | ORAL_SOLUTION | Freq: Once | ORAL | Status: DC | PRN

## 2024-06-26 MED ORDER — SODIUM CHLORIDE 0.9 % IV SOLN
INTRAVENOUS | Status: DC
Start: 2024-06-26 — End: 2024-06-26

## 2024-06-26 MED ORDER — HYDROMORPHONE HCL 1 MG/ML IJ SOLN: 1.0000 mg | Freq: Once | INTRAMUSCULAR | Status: DC | PRN

## 2024-06-26 MED ORDER — METHYLPREDNISOLONE SODIUM SUCC 125 MG IJ SOLR: 125.0000 mg | Freq: Once | INTRAMUSCULAR | Status: DC | PRN

## 2024-06-26 MED ORDER — ONDANSETRON HCL 4 MG/2ML IJ SOLN: 4.0000 mg | Freq: Four times a day (QID) | INTRAMUSCULAR | Status: DC | PRN

## 2024-06-26 NOTE — Progress Notes (Signed)
 Pt presented for fistulogram today but procedure had to be rescheduled per Dr. Marea. Called [facility] to make them aware. Facility transport picked pt up.

## 2024-07-01 ENCOUNTER — Telehealth (INDEPENDENT_AMBULATORY_CARE_PROVIDER_SITE_OTHER): Payer: Self-pay

## 2024-07-01 NOTE — Telephone Encounter (Signed)
 I attempted to contact the patient to reschedule her right arm fistulagram with Dr. Marea. A message was left for a return call.

## 2024-07-02 NOTE — Progress Notes (Signed)
 Remote Loop Recorder Transmission

## 2024-07-03 ENCOUNTER — Ambulatory Visit (INDEPENDENT_AMBULATORY_CARE_PROVIDER_SITE_OTHER): Admitting: Nurse Practitioner

## 2024-07-03 ENCOUNTER — Encounter (INDEPENDENT_AMBULATORY_CARE_PROVIDER_SITE_OTHER)

## 2024-07-07 ENCOUNTER — Telehealth (INDEPENDENT_AMBULATORY_CARE_PROVIDER_SITE_OTHER): Payer: Self-pay

## 2024-07-07 NOTE — Telephone Encounter (Signed)
 I attempted to contact Deanna [name] at [facility] to reschedule the patient's right arm fistulagram. A message was left for a return call.

## 2024-07-09 ENCOUNTER — Ambulatory Visit (INDEPENDENT_AMBULATORY_CARE_PROVIDER_SITE_OTHER): Payer: Self-pay

## 2024-07-09 DIAGNOSIS — I70219 Atherosclerosis of native arteries of extremities with intermittent claudication, unspecified extremity: Secondary | ICD-10-CM

## 2024-07-10 ENCOUNTER — Ambulatory Visit: Payer: Self-pay | Admitting: Cardiology

## 2024-07-10 LAB — CUP PACEART REMOTE DEVICE CHECK
Date Time Interrogation Session: 20251014232349
Implantable Pulse Generator Implant Date: 20241113

## 2024-07-11 NOTE — Progress Notes (Signed)
 Remote Loop Recorder Transmission

## 2024-07-22 NOTE — Progress Notes (Signed)
 Chief Complaint: Patient returns for diabetic foot care. Some continued paresthesias.   Exam Skin is dry and atrophic with bilateral edema. Diminished hair growth.  All 10 toenails are thick, dystrophic and discolored, brittle with subungual debris. Hyperkeratosis is noted beneath the fifth metatarsal head bilateral as well as the head of the proximal phalanx on the right second toe.  No sign of ulcerations.       Impression: 1.  Diabetes with onychomycosis.  2.  Hyperkeratosis bilateral.     Plan: Debridement of all 10 toenails in length and thickness sharply using toenail nippers.  Paring of the lesions. Return to clinic in 4 months.    TODD ELSIE ROSELLA, DPM

## 2024-07-24 ENCOUNTER — Ambulatory Visit: Admitting: Student in an Organized Health Care Education/Training Program

## 2024-07-25 ENCOUNTER — Telehealth (INDEPENDENT_AMBULATORY_CARE_PROVIDER_SITE_OTHER): Payer: Self-pay

## 2024-07-25 NOTE — Telephone Encounter (Signed)
 Spoke with Tammy at [facility] and the patient is scheduled for a permcath insertion on 07/28/24 with a 12:00 pm arrival time to the [facility]. Pre-procedure instructions were discussed and will be faxed attn to Tammy at [facility]

## 2024-07-28 ENCOUNTER — Ambulatory Visit
Admission: RE | Admit: 2024-07-28 | Discharge: 2024-07-28 | Disposition: A | Discharge status: Home / Self Care | Attending: Vascular Surgery | Admitting: Vascular Surgery

## 2024-07-28 ENCOUNTER — Encounter: Admission: RE | Disposition: A | Payer: Self-pay | Source: Home / Self Care | Attending: Vascular Surgery

## 2024-07-28 DIAGNOSIS — N186 End stage renal disease: Secondary | ICD-10-CM

## 2024-07-28 SURGERY — DIALYSIS/PERMA CATHETER INSERTION
Anesthesia: Moderate Sedation

## 2024-07-28 MED ORDER — ONDANSETRON HCL 4 MG/2ML IJ SOLN: 4.0000 mg | Freq: Four times a day (QID) | INTRAMUSCULAR | Status: DC | PRN

## 2024-07-28 MED ORDER — SODIUM CHLORIDE 0.9 % IV SOLN: INTRAVENOUS | Status: DC

## 2024-07-28 MED ORDER — FAMOTIDINE 20 MG PO TABS: 40.0000 mg | ORAL_TABLET | Freq: Once | ORAL | Status: DC | PRN

## 2024-07-28 MED ORDER — MIDAZOLAM HCL 2 MG/ML PO SYRP: 8.0000 mg | ORAL_SOLUTION | Freq: Once | ORAL | Status: DC | PRN

## 2024-07-28 MED ORDER — HYDROMORPHONE HCL 1 MG/ML IJ SOLN: 1.0000 mg | Freq: Once | INTRAMUSCULAR | Status: DC | PRN

## 2024-07-28 MED ORDER — CEFAZOLIN SODIUM-DEXTROSE 1-4 GM/50ML-% IV SOLN: 1.0000 g | INTRAVENOUS | Status: DC

## 2024-07-28 MED ORDER — DIPHENHYDRAMINE HCL 50 MG/ML IJ SOLN: 50.0000 mg | Freq: Once | INTRAMUSCULAR | Status: DC | PRN

## 2024-07-28 MED ORDER — METHYLPREDNISOLONE SODIUM SUCC 125 MG IJ SOLR: 125.0000 mg | Freq: Once | INTRAMUSCULAR | Status: DC | PRN

## 2024-07-28 NOTE — H&P (Signed)
  Patient already had a PermCath placed at another facility and does not need one today.

## 2024-08-05 ENCOUNTER — Ambulatory Visit: Admitting: Student in an Organized Health Care Education/Training Program

## 2024-08-10 ENCOUNTER — Ambulatory Visit: Attending: Cardiology

## 2024-08-10 DIAGNOSIS — I70219 Atherosclerosis of native arteries of extremities with intermittent claudication, unspecified extremity: Secondary | ICD-10-CM | POA: Diagnosis not present

## 2024-08-11 LAB — CUP PACEART REMOTE DEVICE CHECK
Date Time Interrogation Session: 20251115233018
Implantable Pulse Generator Implant Date: 20241113

## 2024-08-12 NOTE — Progress Notes (Signed)
 Remote Loop Recorder Transmission

## 2024-08-13 ENCOUNTER — Ambulatory Visit: Payer: Self-pay | Admitting: Cardiology

## 2024-08-15 ENCOUNTER — Other Ambulatory Visit: Payer: Self-pay

## 2024-08-15 ENCOUNTER — Inpatient Hospital Stay
Admission: EM | Admit: 2024-08-15 | Discharge: 2024-09-05 | DRG: 061 | Disposition: A | Discharge status: Skilled Nursing Facility | Attending: Internal Medicine | Admitting: Internal Medicine

## 2024-08-15 ENCOUNTER — Encounter: Payer: Self-pay | Admitting: Neurology

## 2024-08-15 ENCOUNTER — Emergency Department

## 2024-08-15 DIAGNOSIS — R29818 Other symptoms and signs involving the nervous system: Secondary | ICD-10-CM

## 2024-08-15 DIAGNOSIS — R29810 Facial weakness: Secondary | ICD-10-CM

## 2024-08-15 DIAGNOSIS — E1151 Type 2 diabetes mellitus with diabetic peripheral angiopathy without gangrene: Secondary | ICD-10-CM | POA: Diagnosis not present

## 2024-08-15 DIAGNOSIS — E119 Type 2 diabetes mellitus without complications: Secondary | ICD-10-CM | POA: Diagnosis not present

## 2024-08-15 DIAGNOSIS — R471 Dysarthria and anarthria: Principal | ICD-10-CM

## 2024-08-15 DIAGNOSIS — Z992 Dependence on renal dialysis: Secondary | ICD-10-CM | POA: Diagnosis not present

## 2024-08-15 DIAGNOSIS — I69351 Hemiplegia and hemiparesis following cerebral infarction affecting right dominant side: Secondary | ICD-10-CM | POA: Diagnosis not present

## 2024-08-15 DIAGNOSIS — I639 Cerebral infarction, unspecified: Principal | ICD-10-CM | POA: Diagnosis present

## 2024-08-15 DIAGNOSIS — N186 End stage renal disease: Secondary | ICD-10-CM | POA: Diagnosis not present

## 2024-08-15 DIAGNOSIS — I63522 Cerebral infarction due to unspecified occlusion or stenosis of left anterior cerebral artery: Secondary | ICD-10-CM

## 2024-08-15 LAB — COMPREHENSIVE METABOLIC PANEL WITH GFR
ALT: 17 U/L (ref 0–44)
AST: 24 U/L (ref 15–41)
Albumin: 4.3 g/dL (ref 3.5–5.0)
Alkaline Phosphatase: 174 U/L — ABNORMAL HIGH (ref 38–126)
Anion gap: 14 (ref 5–15)
BUN: 12 mg/dL (ref 8–23)
CO2: 29 mmol/L (ref 22–32)
Calcium: 8.8 mg/dL — ABNORMAL LOW (ref 8.9–10.3)
Chloride: 96 mmol/L — ABNORMAL LOW (ref 98–111)
Creatinine, Ser: 2.72 mg/dL — ABNORMAL HIGH (ref 0.44–1.00)
GFR, Estimated: 18 mL/min — ABNORMAL LOW (ref >60)
Glucose, Bld: 98 mg/dL (ref 70–99)
Potassium: 4.3 mmol/L (ref 3.5–5.1)
Sodium: 139 mmol/L (ref 135–145)
Total Bilirubin: 0.4 mg/dL (ref 0.0–1.2)
Total Protein: 8.5 g/dL — ABNORMAL HIGH (ref 6.5–8.1)

## 2024-08-15 LAB — PROTIME-INR
INR: 1 (ref 0.8–1.2)
Prothrombin Time: 13.8 s (ref 11.4–15.2)

## 2024-08-15 LAB — DIFFERENTIAL
Abs Immature Granulocytes: 0.06 K/uL (ref 0.00–0.07)
Basophils Absolute: 0 K/uL (ref 0.0–0.1)
Basophils Relative: 1 %
Eosinophils Absolute: 0.2 K/uL (ref 0.0–0.5)
Eosinophils Relative: 2 %
Immature Granulocytes: 1 %
Lymphocytes Relative: 18 %
Lymphs Abs: 1.4 K/uL (ref 0.7–4.0)
Monocytes Absolute: 0.7 K/uL (ref 0.1–1.0)
Monocytes Relative: 9 %
Neutro Abs: 5.5 K/uL (ref 1.7–7.7)
Neutrophils Relative %: 69 %

## 2024-08-15 LAB — GLUCOSE, CAPILLARY
Glucose-Capillary: 78 mg/dL (ref 70–99)
Glucose-Capillary: 91 mg/dL (ref 70–99)

## 2024-08-15 LAB — CBC
HCT: 35.2 % — ABNORMAL LOW (ref 36.0–46.0)
Hemoglobin: 11.2 g/dL — ABNORMAL LOW (ref 12.0–15.0)
MCH: 27.5 pg (ref 26.0–34.0)
MCHC: 31.8 g/dL (ref 30.0–36.0)
MCV: 86.3 fL (ref 80.0–100.0)
Platelets: 186 K/uL (ref 150–400)
RBC: 4.08 MIL/uL (ref 3.87–5.11)
RDW: 18.2 % — ABNORMAL HIGH (ref 11.5–15.5)
WBC: 7.9 K/uL (ref 4.0–10.5)
nRBC: 0 % (ref 0.0–0.2)

## 2024-08-15 LAB — APTT: aPTT: 30 s (ref 24–36)

## 2024-08-15 LAB — CBG MONITORING, ED: Glucose-Capillary: 91 mg/dL (ref 70–99)

## 2024-08-15 LAB — ETHANOL: Alcohol, Ethyl (B): <15 mg/dL (ref <15)

## 2024-08-15 MED ORDER — PANTOPRAZOLE SODIUM 40 MG IV SOLR
40.0000 mg | Freq: Every day | INTRAVENOUS | Status: DC
  Administered 2024-08-16: 40 mg via INTRAVENOUS
  Filled 2024-08-15: qty 10

## 2024-08-15 MED ORDER — CLEVIDIPINE BUTYRATE 0.5 MG/ML IV EMUL
0.0000 mg/h | INTRAVENOUS | Status: DC
  Filled 2024-08-15 (×2): qty 50

## 2024-08-15 MED ORDER — SENNOSIDES-DOCUSATE SODIUM 8.6-50 MG PO TABS: 1.0000 | ORAL_TABLET | Freq: Every evening | ORAL | Status: DC | PRN

## 2024-08-15 MED ORDER — STROKE: EARLY STAGES OF RECOVERY BOOK: Freq: Once | Status: AC

## 2024-08-15 MED ORDER — SODIUM CHLORIDE 0.9% FLUSH: 3.0000 mL | Freq: Once | INTRAVENOUS | Status: DC

## 2024-08-15 MED ORDER — TENECTEPLASE 25 MG IV KIT
0.2500 mg/kg | PACK | Freq: Once | INTRAVENOUS | Status: AC
  Administered 2024-08-15: 19 mg via INTRAVENOUS

## 2024-08-15 MED ORDER — IOHEXOL 350 MG/ML SOLN
75.0000 mL | Freq: Once | INTRAVENOUS | Status: AC | PRN
  Administered 2024-08-15: 75 mL via INTRAVENOUS

## 2024-08-15 MED ORDER — ACETAMINOPHEN 325 MG PO TABS
650.0000 mg | ORAL_TABLET | ORAL | Status: DC | PRN
  Administered 2024-08-16 – 2024-08-25 (×3): 650 mg via ORAL
  Filled 2024-08-15 (×3): qty 2

## 2024-08-15 MED ORDER — ACETAMINOPHEN 160 MG/5ML PO SOLN: 650.0000 mg | ORAL | Status: DC | PRN

## 2024-08-15 MED ORDER — SODIUM CHLORIDE 0.9 % IV SOLN: INTRAVENOUS | Status: DC

## 2024-08-15 MED ORDER — INSULIN ASPART 100 UNIT/ML IJ SOLN
0.0000 [IU] | INTRAMUSCULAR | Status: DC
  Administered 2024-08-18: 1 [IU] via SUBCUTANEOUS
  Filled 2024-08-15: qty 1

## 2024-08-15 MED ORDER — ACETAMINOPHEN 650 MG RE SUPP: 650.0000 mg | RECTAL | Status: DC | PRN

## 2024-08-15 NOTE — ED Notes (Signed)
 [phone]- neurologist discussing TNK with pt. Pt is agreeable to getting TNK. When nursing staff told pt she would need to get an IV before we could her TNK, pt refused to have IV started because she is afraid of needles. This RN, Dr. Lindzen, and EDP Bradler spoke with pt and informed her that she would not be able to get the TNK without having an IV because this is how we give the medication. Pt advised it is her choice if she gets the medication but in order to get the medication she needs to have an IV started. Pt still refusing at this time. Pt is aware of the risk and benefits of getting medication.   1501- Pt agreeable to letting RN start IV but states that we can only stick her one time. 20 G IV placed in pts left forearm. Blood work obtained and sent to lab.

## 2024-08-15 NOTE — ED Triage Notes (Signed)
 Pt to ED via [facility] from dialysis. Per EMS pt was at dialysis and staff noted that pt right sided facial droop and slurred speech. Pt does have hx/o previous CVA with right sided deficients. Pt is on Plavix  and Aspirin . EMS unable to get IV on pt.

## 2024-08-15 NOTE — Progress Notes (Signed)
 CODE STROKE- PHARMACY COMMUNICATION   Time CODE STROKE called/page received: 14:20  Time response to CODE STROKE was made (in person or via phone): 14:25  Time Stroke Kit retrieved from Pyxis and mixed (only if needed): 15:15  TNK dose: 19 mg (3.8 mL) based off of patient's weight 75.8 kg   Name of Provider/Nurse contacted: Dr. 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   required intubation   Anemia    C3 cervical fracture (HCC) 04/2023   Cervical stress fracture    CHF (congestive heart failure) (HCC)    combined systolic and diastolic   Coronary artery disease    ESRD on dialysis (HCC)    M-W-Fr   GERD (gastroesophageal reflux disease)    Hyperlipidemia    Hypertension    Pneumonia    Secondary hyperparathyroidism of renal origin    Stroke [facility])    with right sided weakness   Type 2 diabetes mellitus with diabetic neuropathy (HCC)    Ulnar neuropathy at elbow of right upper extremity    Prior to Admission medications   Medication Sig Start Date End Date Taking? Authorizing Provider  ammonium lactate (AMLACTIN) 12 % cream Apply topically. 10/08/23 10/07/24  [provider]  aspirin  EC 81 MG tablet Take 81 mg by mouth daily.    [provider]  atorvastatin  (LIPITOR) 40 MG tablet Take 40 mg by mouth daily.    [provider]  calcium  acetate (PHOSLO ) 667 MG capsule Take 1,334 mg by mouth 3 (three) times daily. 09/13/23   [provider]  clopidogrel  (PLAVIX ) 75 MG tablet Take 75 mg by mouth at bedtime.    [provider]  ferrous sulfate  325 (65 FE) MG EC tablet Take 325 mg by mouth in the morning. Patient not taking: Reported on 06/24/2024 08/16/21 06/19/24  [provider]  gabapentin  (NEURONTIN ) 100 MG capsule Take 100 mg by mouth 2 (two) times daily as needed.    [provider]  Menthol, Topical Analgesic, (BIOFREEZE COOL THE PAIN) 4 % GEL Apply topically.     [provider]  metoprolol  tartrate (LOPRESSOR ) 25 MG tablet Take 1 tablet (25 mg total) by mouth 2 (two) times daily. 01/13/22 06/26/24  Gherghe, Costin M, MD  midodrine  (PROAMATINE ) 5 MG tablet Take 5 mg by mouth 2 (two) times daily with a meal. 02/22/24   [provider]  ondansetron  (ZOFRAN -ODT) 4 MG disintegrating tablet  10/22/23   [provider]  oxyCODONE -acetaminophen  (PERCOCET) 5-325 MG tablet Take 1 tablet by mouth every 4 (four) hours as needed for severe pain (pain score 7-10). 08/09/23   Aivan Fillingim, Sona, MD  sertraline  (ZOLOFT ) 25 MG tablet Take 25 mg by mouth daily.    [provider]  Vitamin D, Ergocalciferol, (DRISDOL) 1.25 MG (50000 UNIT) CAPS capsule Take 50,000 Units by mouth once a week. Wednesday 10/12/21   [provider]    Ransom Blanch PGY-1 Pharmacy Resident  [place] [facility]  08/15/2024 3:30 PM

## 2024-08-15 NOTE — ED Notes (Signed)
 Report called to ICU

## 2024-08-15 NOTE — Consult Note (Signed)
 NEUROLOGY CONSULT NOTE   Date of service: August 15, 2024 Patient Name: Deanna [name] MRN:  [id number] DOB:  09/09/1951 Chief Complaint: Acute onset of slurred speech and right facial droop Requesting Provider: Jossie Artist POUR, MD  History of Present Illness  Deanna [name] is a 73 y.o. 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 She was also experiencing worsened weakness/numbness of the right upper extremity relative to her baseline. Code Stroke was called.   Home medications include ASA, Plavix  and atorvastatin .   LKW: 1200 Modified rankin score: 3-Moderate disability-requires help but walks WITHOUT assistance IV Thrombolysis: Yes EVT: No: CTA negative for LVO  NIHSS components Score: Comment  1a Level of Conscious 0[x]  1[]  2[]  3[]      1b LOC Questions 0[x]  1[]  2[]      Also correctly oriented to the day, year, city and state.  1c LOC Commands 0[x]  1[]  2[]       2 Best Gaze 0[x]  1[]  2[]       3 Visual 0[x]  1[]  2[]  3[]      4 Facial Palsy 0[]  1[]  2[x]  3[]      5a Motor Arm - left 0[x]  1[]  2[]  3[]  4[]  UN[]    5b Motor Arm - Right 0[x]  1[]  2[]  3[]  4[]  UN[]    6a Motor Leg - Left 0[]  1[]  2[]  3[]  4[]  UN[x]   Unable to elevate legs due to severe back and hip pain. Wiggles toes and plantar flexes equally bilaterally with 4/5 strength.   6b Motor Leg - Right 0[]  1[]  2[]  3[]  4[]  UN[x]    7 Limb Ataxia 0[x]  1[]  2[]  UN[]      8 Sensory 0[]  1[x]  2[]  UN[]     Decreased sensation to right face and RUE.   9 Best Language 0[x]  1[]  2[]  3[]      10 Dysarthria 0[]  1[x]  2[]  UN[]      11 Extinct. and Inattention 0[x]  1[]  2[]       TOTAL:    4      ROS  Severe  LBP and left hip pain. Other symptoms as per HPI. Comprehensive ROS deferred in the context of acuity of presentation.   Past History   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   required intubation   Anemia    C3 cervical fracture (HCC) 04/2023   Cervical stress fracture    CHF (congestive heart failure) (HCC)    combined systolic and diastolic   Coronary artery disease    ESRD on dialysis (HCC)    M-W-Fr   GERD (gastroesophageal reflux disease)    Hyperlipidemia    Hypertension    Pneumonia    Secondary hyperparathyroidism of renal origin    Stroke [facility])    with right sided weakness   Type 2 diabetes mellitus with diabetic neuropathy (HCC)    Ulnar neuropathy at elbow of right upper extremity    Past Surgical History:  Procedure Laterality Date   A/V FISTULAGRAM Right 06/28/2023   Procedure: A/V Fistulagram;  Surgeon: Marea Selinda RAMAN, MD;  Location: [facility] INVASIVE CV LAB;  Service: Cardiovascular;  Laterality: Right;   A/V FISTULAGRAM Left 01/31/2024   Procedure: A/V Fistulagram;  Surgeon: Marea Selinda RAMAN, MD;  Location: [facility]  INVASIVE CV LAB;  Service: Cardiovascular;  Laterality: Left;   AV FISTULA PLACEMENT Right 05/17/2023   Procedure: INSERTION OF ARTERIOVENOUS (AV) GORE-TEX GRAFT ARM;  Surgeon: Marea Selinda RAMAN, MD;  Location: [facility] ORS;  Service: Vascular;  Laterality: Right;   CARDIAC CATHETERIZATION Left 03/01/2016   Procedure: Left Heart Cath and Coronary Angiography;  Surgeon: Vinie DELENA Jude, MD;  Location: [facility] INVASIVE CV LAB;  Service: Cardiovascular;  Laterality: Left;   CESAREAN SECTION     DIALYSIS/PERMA CATHETER INSERTION Right 01/10/2022   Procedure: DIALYSIS/PERMA CATHETER INSERTION;  Surgeon: Tisa Curry DELENA, MD;  Location: [facility] INVASIVE CV LAB;  Service: Cardiovascular;  Laterality: Right;   DIALYSIS/PERMA CATHETER INSERTION N/A 04/12/2022   Procedure: DIALYSIS/PERMA CATHETER INSERTION;  Surgeon: Marea Selinda RAMAN, MD;  Location:  [facility] INVASIVE CV LAB;  Service: Cardiovascular;  Laterality: N/A;   DIALYSIS/PERMA CATHETER REMOVAL N/A 09/03/2023   Procedure: DIALYSIS/PERMA CATHETER REMOVAL;  Surgeon: Marea Selinda RAMAN, MD;  Location: [facility] INVASIVE CV LAB;  Service: Cardiovascular;  Laterality: N/A;   INTUBATION-ENDOTRACHEAL WITH TRACHEOSTOMY STANDBY Left 01/14/2022   Procedure: INTUBATION-NASAL WITH TRACHEOSTOMY STANDBY;  Surgeon: Herminio Miu, MD;  Location: [facility] ORS;  Service: ENT;  Laterality: Left;   LOOP RECORDER INSERTION Left 08/08/2023   Procedure: LOOP RECORDER INSERTION;  Surgeon: Cindie Ole DASEN, MD;  Location: [facility] INVASIVE CV LAB;  Service: Cardiovascular;  Laterality: Left;   LOWER EXTREMITY ANGIOGRAPHY Right 07/23/2023   Procedure: Lower Extremity Angiography;  Surgeon: Marea Selinda RAMAN, MD;  Location: [facility] INVASIVE CV LAB;  Service: Cardiovascular;  Laterality: Right;   LOWER EXTREMITY ANGIOGRAPHY Left 01/03/2024   Procedure: Lower Extremity Angiography;  Surgeon: Marea Selinda RAMAN, MD;  Location: [facility] INVASIVE CV LAB;  Service: Cardiovascular;  Laterality: Left;   NERVE TRANSFER Right 11/08/2023   Procedure: ANTERIOR INTEROSSEOUS NERVE TO ULNAR NERVE TRANSFER, END TO SIDE;  Surgeon: Claudene Penne ORN, MD;  Location: [facility] ORS;  Service: Neurosurgery;  Laterality: Right;   TEMPORARY DIALYSIS CATHETER N/A 01/04/2022   Procedure: TEMPORARY DIALYSIS CATHETER;  Surgeon: Marea Selinda RAMAN, MD;  Location: [facility] INVASIVE CV LAB;  Service: Cardiovascular;  Laterality: N/A;   ULNAR NERVE DECOMPRESSION Right 11/08/2023   Procedure: RIGHT ULNAR NERVE DECOMPRESSION;  Surgeon: Claudene Penne ORN, MD;  Location: [facility] ORS;  Service: Neurosurgery;  Laterality: Right;    Family History: Family History  Problem Relation Age of Onset   Osteoarthritis Brother     Social History  reports that she has never smoked. She has never used smokeless tobacco. She reports that she does not drink alcohol and does not use drugs.  Allergies  Allergen  Reactions   Ace Inhibitors Swelling   Entresto  [Sacubitril -Valsartan ] Swelling    Patient said she took both lisinopril and Entresto  so it is not clear which one she actually reacted to.  For safety reasons, she has been advised to avoid both ACE inhibitors and Entresto .   Lisinopril Swelling    Severe Angioedema (requiring Nasotracheal intubation)   Peanut-Containing Drug Products    Penicillins Rash   Tomato Rash and Other (See Comments)    Medications   Current Facility-Administered Medications:    sodium chloride  flush (NS) 0.9 % injection 3 mL, 3 mL, Intravenous, Once, Bradler, Evan K, MD  Current Outpatient Medications:    ammonium lactate (AMLACTIN) 12 % cream, Apply topically., Disp: , Rfl:    aspirin  EC 81 MG tablet, Take 81 mg by mouth daily., Disp: , Rfl:    atorvastatin  (LIPITOR) 40 MG tablet, Take 40  mg by mouth daily., Disp: , Rfl:    calcium  acetate (PHOSLO ) 667 MG capsule, Take 1,334 mg by mouth 3 (three) times daily., Disp: , Rfl:    clopidogrel  (PLAVIX ) 75 MG tablet, Take 75 mg by mouth at bedtime., Disp: , Rfl:    ferrous sulfate  325 (65 FE) MG EC tablet, Take 325 mg by mouth in the morning. (Patient not taking: Reported on 06/24/2024), Disp: , Rfl:    gabapentin  (NEURONTIN ) 100 MG capsule, Take 100 mg by mouth 2 (two) times daily as needed., Disp: , Rfl:    Menthol, Topical Analgesic, (BIOFREEZE COOL THE PAIN) 4 % GEL, Apply topically., Disp: , Rfl:    metoprolol  tartrate (LOPRESSOR ) 25 MG tablet, Take 1 tablet (25 mg total) by mouth 2 (two) times daily., Disp: 60 tablet, Rfl: 0   midodrine  (PROAMATINE ) 5 MG tablet, Take 5 mg by mouth 2 (two) times daily with a meal., Disp: , Rfl:    ondansetron  (ZOFRAN -ODT) 4 MG disintegrating tablet, , Disp: , Rfl:    oxyCODONE -acetaminophen  (PERCOCET) 5-325 MG tablet, Take 1 tablet by mouth every 4 (four) hours as needed for severe pain (pain score 7-10)., Disp: 5 tablet, Rfl: 0   sertraline  (ZOLOFT ) 25 MG tablet, Take 25 mg by  mouth daily., Disp: , Rfl:    Vitamin D, Ergocalciferol, (DRISDOL) 1.25 MG (50000 UNIT) CAPS capsule, Take 50,000 Units by mouth once a week. Wednesday, Disp: , Rfl:   Vitals   There were no vitals filed for this visit.  There is no height or weight on file to calculate BMI.   Physical Exam   Constitutional: Appears well-developed and well-nourished.  Psych: Anxious and mildly dysthymic affect is appropriate to situation.  Eyes: No scleral injection.  HENT: No OP obstruction.  Head: Normocephalic.  Respiratory: Effort normal, non-labored breathing.  Skin: WDI.   Neurologic Examination   See NIHSS  Labs/Imaging/Neurodiagnostic studies   CBC: No results for input(s): WBC, NEUTROABS, HGB, HCT, MCV, PLT in the last 168 hours. Basic Metabolic Panel:  Lab Results  Component Value Date   NA 138 11/08/2023   K 5.2 (H) 06/26/2024   CO2 26 10/11/2023   GLUCOSE 112 (H) 11/08/2023   BUN 34 (H) 11/08/2023   CREATININE 4.80 (H) 11/08/2023   CALCIUM  8.5 (L) 10/11/2023   GFRNONAA 9 (L) 10/11/2023   GFRAA 36 (L) 12/09/2017   Lipid Panel:  Lab Results  Component Value Date   LDLCALC 57 08/05/2023   HgbA1c:  Lab Results  Component Value Date   HGBA1C 5.2 08/05/2023   Prior TTE (08/06/23): 1. TDS.   2. Left ventricular ejection fraction, by estimation, is 50 to 55%. The  left ventricle has low normal function. The left ventricle has no regional  wall motion abnormalities. Left ventricular diastolic function could not  be evaluated.   3. Right ventricular systolic function is normal. The right ventricular  size is normal.   4. The mitral valve is normal in structure. Trivial mitral valve  regurgitation.   5. The aortic valve is normal in structure. Aortic valve regurgitation is  not visualized.   ASSESSMENT  Deanna [name] is a 73 y.o. 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 She was also experiencing worsened weakness/numbness of the right upper extremity relative to her baseline. Code Stroke was called.  - Exam reveals severe dysarthria, right facial droop and right sided sensory deficit. NIHSS 4.  - Labs: Creatinine 2.7, Alkaline phosphatase 174, hemoglobin 11.2, hematocrit 35.2 Ethyl alcohol <15  - CT head: Likely acute or subacute nonhemorrhagic infarct in the anterior left temporal lobe measuring up to 3.8 cm. Moderate confluent periventricular and subcortical white matter hypoattenuation, stable. ASPECTS: 8. - CTA of head and neck: No acute large vessel occlusion. Extensive predominantly calcified plaque along the proximal right cervical ICA with positive remodeling resulting in less than 50% stenosis. - EKG: Sinus rhythm; Ventricular premature complex; Posterior infarct, old; Repol abnrm suggests ischemia, anterolateral - Impression:  - Acute onset of right sided motor deficits and dysarthria. Possible localizations include the left thalamus, left basal ganglia, left internal capsule and left pontine tegmentum. DDx for etiology includes cardioembolic, atherothrombotic and chronic cerebral small vessel disease - After comprehensive review of possible contraindications, she has no absolute contraindications to TNK administration. - Patient is an IV thrombolysis candidate. Discussed extensively the risks/benefits of IV thrombolysis treatment vs. no treatment with the patient, including risks of hemorrhage and death with IV thrombolysis administration versus worse overall outcomes on average in patients within thrombolysis time window who are not administered TNK. Overall benefits of TNK regarding long-term  prognosis are felt to outweigh risks. The patient expressed understanding and wish to proceed with TNK.  - After obtaining informed consent, there was significant delay in TNK administration due to requirement for IV access with patient initially refusing. After extended discussions of risks/benefits of obtaining the IV for TNK, the patient did consent after a significant period of time engaged in discussion with staff.   RECOMMENDATIONS  1. Admitting to the ICU under CCM 2. Post-TNK order set to include frequent neuro checks and BP management.  3. No antiplatelet medications or anticoagulants for at least 24 hours following TNK.  4. DVT prophylaxis with SCDs.  5. Continue her statin.  6. Restart her DAPT if follow up CT at 24 hours is negative for hemorrhagic conversion. 7. Consult Nephrology. Dialyze off the IV contrast that was used for CTA  8. TTE.  9. MRI brain.  10. PT/OT/Speech.  11. NPO until passes swallow evaluation.  12. Sliding scale insulin .  13. Telemetry monitoring 14. Fasting lipid panel, HgbA1c 15. SSI  ______________________________________________________________________    Bonney SHARK, Simone Tuckey, MD [facility]

## 2024-08-15 NOTE — Progress Notes (Signed)
 Patient becomes very agitated when staff speaks to her. She is refusing to participate in NIHSS. She is demanding to have something to eat. She wants to go home right now. It was explained that she cannot go home right now due to experiencing a stroke and receiving TNK would put her at risk for bleeding. Patient stated this is bullshit. Give me something to eat. This RN reiterated that she could not have anything to eat until we attempted a swallow screen and for that she would have to be willing to participate in the assessment. She requested that this RN leave her alone. This RN will continue to attempt NIHSS and swallow screening.

## 2024-08-15 NOTE — ED Provider Notes (Addendum)
 [facility] Provider Note   Event Date/Time   First MD Initiated Contact with Patient 08/15/24 1437     (approximate) History  Code Stroke  HPI Deanna [name] is a 73 y.o. 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 Pt on plavix  but no blood thinners. Pt also complains of weakness and numbness in the R upper extremity. ROS: Patient currently denies any vision changes, tinnitus, sore throat, chest pain, shortness of breath, abdominal pain, nausea/vomiting/diarrhea, dysuria   Physical Exam  Triage Vital Signs: ED Triage Vitals  Encounter Vitals Group     BP      Girls Systolic BP Percentile      Girls Diastolic BP Percentile      Boys Systolic BP Percentile      Boys Diastolic BP Percentile      Pulse      Resp      Temp      Temp src      SpO2      Weight      Height      Head Circumference      Peak Flow      Pain Score      Pain Loc      Pain Education      Exclude from Growth Chart    Most recent vital signs: Vitals:   08/17/24 0805 08/17/24 0830  BP: 130/61 131/68  Pulse: 82 87  Resp: 20 17  Temp:    SpO2: 99% 100%   General: Awake, oriented x4. Slurred speech CV:  Good peripheral perfusion. Resp:  Normal effort. Abd:  No distention. Other:  Elderly overweight AA female resting comfortably on stretcher.  R facial droop. No drift in upper or lower ext. Numbness to R upper and lower extremities. No ataxia ED Results / Procedures / Treatments  Labs (all labs ordered are listed, but only abnormal results are displayed) Labs Reviewed  CBC - Abnormal; Notable for the following components:      Result Value   Hemoglobin 11.2 (*)    HCT 35.2 (*)    RDW 18.2 (*)    All other components within normal limits  COMPREHENSIVE METABOLIC PANEL WITH GFR - Abnormal; Notable for the following components:   Chloride 96 (*)    Creatinine, Ser 2.72 (*)     Calcium  8.8 (*)    Total Protein 8.5 (*)    Alkaline Phosphatase 174 (*)    GFR, Estimated 18 (*)    All other components within normal limits  HEMOGLOBIN A1C - Abnormal; Notable for the following components:   Hgb A1c MFr Bld 5.7 (*)    All other components within normal limits  LIPID PANEL - Abnormal; Notable for the following components:   LDL Cholesterol 105 (*)    All other components within normal limits  CBC - Abnormal; Notable for the following components:   RBC 3.59 (*)    Hemoglobin 9.9 (*)    HCT 31.2 (*)    RDW 17.9 (*)    All other components within normal limits  RENAL FUNCTION PANEL - Abnormal; Notable for the following components:   Creatinine, Ser 3.65 (*)    Calcium  8.2 (*)    GFR, Estimated 13 (*)    All other components within normal limits  GLUCOSE, CAPILLARY - Abnormal; Notable for the following components:  Glucose-Capillary 141 (*)    All other components within normal limits  GLUCOSE, CAPILLARY - Abnormal; Notable for the following components:   Glucose-Capillary 133 (*)    All other components within normal limits  CBC - Abnormal; Notable for the following components:   RBC 3.65 (*)    Hemoglobin 10.0 (*)    HCT 31.4 (*)    RDW 17.4 (*)    All other components within normal limits  RENAL FUNCTION PANEL - Abnormal; Notable for the following components:   BUN 29 (*)    Creatinine, Ser 5.15 (*)    Calcium  8.2 (*)    Phosphorus 5.3 (*)    GFR, Estimated 8 (*)    All other components within normal limits  PROTIME-INR  APTT  DIFFERENTIAL  ETHANOL  GLUCOSE, CAPILLARY  GLUCOSE, CAPILLARY  GLUCOSE, CAPILLARY  GLUCOSE, CAPILLARY  GLUCOSE, CAPILLARY  GLUCOSE, CAPILLARY  GLUCOSE, CAPILLARY  URINALYSIS, COMPLETE (UACMP) WITH MICROSCOPIC  URINE DRUG SCREEN  HEPATITIS B SURFACE ANTIGEN  HEPATITIS B SURFACE ANTIBODY, QUANTITATIVE  CBC  RENAL FUNCTION PANEL  RENAL FUNCTION PANEL  CBC  HEPATITIS B SURFACE ANTIGEN  HEPATITIS B SURFACE ANTIBODY,  QUANTITATIVE  CBG MONITORING, ED   EKG ED ECG REPORT I, Artist MARLA Kerns, the attending physician, personally viewed and interpreted this ECG. Date: 08/15/2024 EKG Time: 1518 Rate: 93 Rhythm: normal sinus rhythm QRS Axis: normal Intervals: normal ST/T Wave abnormalities: normal Narrative Interpretation: no evidence of acute ischemia RADIOLOGY ED MD interpretation: CT of the head without contrast interpreted by me shows no evidence of acute abnormalities including no intracerebral hemorrhage, obvious masses, or significant edema  CT angiography of the head and neck does not show any evidence of acute abnormalities - All radiology independently interpreted and agree with radiology assessment Official radiology report(s): MR BRAIN WO CONTRAST Result Date: 08/16/2024 EXAM: MRI BRAIN WITHOUT CONTRAST 08/16/2024 03:58:00 PM TECHNIQUE: Multiplanar multisequence MRI of the head/brain was performed without the administration of intravenous contrast. COMPARISON: CT head without contrast 08/15/2024 and MR head without contrast 10/11/2023. CLINICAL HISTORY: Stroke, follow up. Acute onset of slurred speech and facial droop yesterday at dialysis. FINDINGS: BRAIN AND VENTRICLES: An acute nonhemorrhagic infarct in the posterior left frontal lobe measures 2 mm. This involves the primary motor cortex. T2 and FLAIR hyperintensity is associated with the acute/subacute infarct. A remote infarct is present in the anterior right insular cortex and right frontal lobe operculum, new since the prior study. A remote lateral left temporal lobe infarct is new since the prior exam. Confluent periventricular and subcortical T2 hyperintensities are present bilaterally, increased from the prior study. No intracranial hemorrhage. No mass. No midline shift. No hydrocephalus. The sella is unremarkable. Normal flow voids. ORBITS: No acute abnormality. SINUSES AND MASTOIDS: No acute abnormality. BONES AND SOFT TISSUES: Normal marrow  signal. No acute soft tissue abnormality. IMPRESSION: 1. Acute nonhemorrhagic infarct in the posterior left frontal lobe involving the primary motor cortex, with associated T2/FLAIR hyperintensity. 2. Confluent periventricular and subcortical T2 hyperintensities bilaterally, increased from the prior study. 3. Remote infarcts in the anterior right insular cortex, right frontal lobe operculum, and lateral left temporal lobe are new since the prior exam. Electronically signed by: Lonni Necessary MD 08/16/2024 04:27 PM EST RP Workstation: [id number]   PROCEDURES: Critical Care performed: Yes, see critical care procedure note(s) Procedures CRITICAL CARE Performed by: Artist MARLA Kerns  Total critical care time: 31 minutes  Critical care time was exclusive of separately billable procedures and treating other patients.  Critical care was necessary to treat or prevent imminent or life-threatening deterioration.  Critical care was time spent personally by me on the following activities: development of treatment plan with patient and/or surrogate as well as nursing, discussions with consultants, evaluation of patient's response to treatment, examination of patient, obtaining history from patient or surrogate, ordering and performing treatments and interventions, ordering and review of laboratory studies, ordering and review of radiographic studies, pulse oximetry and re-evaluation of patient's condition.  MEDICATIONS ORDERED IN ED: Medications  sodium chloride  flush (NS) 0.9 % injection 3 mL (has no administration in time range)  acetaminophen  (TYLENOL ) tablet 650 mg (650 mg Oral Given 08/16/24 0755)    Or  acetaminophen  (TYLENOL ) 160 MG/5ML solution 650 mg ( Per Tube See Alternative 08/16/24 0755)    Or  acetaminophen  (TYLENOL ) suppository 650 mg ( Rectal See Alternative 08/16/24 0755)  senna-docusate (Senokot-S) tablet 1 tablet (has no administration in time range)  pantoprazole  (PROTONIX )  injection 40 mg (40 mg Intravenous Given 08/16/24 2121)  insulin  aspart (novoLOG ) injection 0-6 Units ( Subcutaneous Patient Refused/Not Given 08/17/24 0336)  atorvastatin  (LIPITOR) tablet 40 mg (40 mg Oral Given 08/16/24 1258)  sertraline  (ZOLOFT ) tablet 100 mg (100 mg Oral Not Given 08/16/24 1259)  calcium  acetate (PHOSLO ) capsule 667 mg (667 mg Oral Not Given 08/16/24 1835)  Chlorhexidine  Gluconate Cloth 2 % PADS 6 each (6 each Topical Patient Refused/Not Given 08/17/24 0555)  pentafluoroprop-tetrafluoroeth (GEBAUERS) aerosol 1 Application (has no administration in time range)  heparin  injection 1,000 Units (has no administration in time range)  anticoagulant sodium citrate  solution 5 mL (has no administration in time range)  alteplase  (CATHFLO ACTIVASE ) injection 2 mg (has no administration in time range)  aspirin  EC tablet 81 mg (81 mg Oral Patient Refused/Not Given 08/17/24 0143)  clopidogrel  (PLAVIX ) tablet 75 mg (75 mg Oral Patient Refused/Not Given 08/17/24 0143)  iohexol  (OMNIPAQUE ) 350 MG/ML injection 75 mL (75 mLs Intravenous Contrast Given 08/15/24 1503)  tenecteplase  (TNKASE ) injection for stroke 19 mg (19 mg Intravenous Given 08/15/24 1515)   stroke: early stages of recovery book ( Does not apply Given 08/16/24 1000)  heparin  sodium (porcine) injection 2,000 Units (1,600 Units Intravenous Given 08/16/24 0615)   IMPRESSION / MDM / ASSESSMENT AND PLAN / ED COURSE  I reviewed the triage vital signs and the nursing notes.                             The patient is on the cardiac monitor to evaluate for evidence of arrhythmia and/or significant heart rate changes. Patient's presentation is most consistent with acute presentation with potential threat to life or bodily function. Stroke alert PMH risk factors: Previous CVA, hypertension Neurologic Deficits: Slurred speech, facial droop, worsening weakness/numbness of the right upper extremity Last known Well Time: 1200 NIH Stroke  Score: 4 Given History and Exam I have lower suspicion for infectious etiology, neurologic changes secondary to toxicologic ingestion, seizure, complex migraine. Presentation concerning for possible stroke requiring workup.  Workup: Labs: POC glucose, CBC, BMP, LFTs, Troponin, PT/INR, PTT, Type and Screen Other Diagnostics: ECG, CXR, non-contrast head CT followed by CTA brain and neck (to r/o large vessel occlusion amenable to thrombectomy) Interventions: Patient qualifies for and has received TNK  Consult: Neurology. Discussed with Dr. Lindzen regarding patient's neurological symptoms and last well-known time and eligibility for TPA criteria. Disposition: Admission to ICU.   FINAL CLINICAL IMPRESSION(S) / ED DIAGNOSES   Final diagnoses:  Dysarthria  Facial droop   Rx / DC Orders   ED Discharge Orders     None      Note:  This document was prepared using Dragon voice recognition software and may include unintentional dictation errors.   Jossie Artist POUR, MD 08/17/24 9155    Hatfield Ducre K, MD 08/28/24 [phone]

## 2024-08-15 NOTE — H&P (Signed)
 Deanna [name], MRN:  [id number], DOB:  09-18-1951, LOS: 0 ADMISSION DATE:  08/15/2024, CONSULTATION DATE:  08/15/2024 REFERRING MD:  Dr. Jossie, CHIEF COMPLAINT:  Code Stroke: right sided facial droop, RUE weakness/numbness and slurred speech   Brief Pt Description / Synopsis:  73 y.o. female with PMHx significant for CVA with right sided deficits, ESRD on HD, CHF, HTN, and HLD admitted with Acute vs subacute Ischemic CVA of the anterior left temporal lobe status post TNK administration.  History of Present Illness:  Deanna [name] is a 73 y.o. female with a past medical history significant for 73 y.o. female with PMHx significant for CVA with right sided deficits, ESRD on HD, CHF, HTN, HLD, anemia, GERD, and Diabetes Mellitus who presented to [facility] ED on 08/15/24 as a CODE STROKE.  She as hemodialysis today, and at around 12:00 noon dialysis staff noted right sided facial droop, slurred speech, and weakness/numbness of the right upper extremity.  She denies dizziness, headache, blurred vision, chest pain, shortness of breath, abdominal pain, nausea/vomiting/diarrhea, or dysuria.   ED Course: Initial Vital Signs: pulse 92, RR 16, BP 164/79, SpO2 98% on room air Significant Labs: Creatinine 2.7, Alkaline phosphatase 174, hemoglobin 11.2, hematocrit 35.2 Ethyl alcohol <15 Imaging CT Head>>IMPRESSION: 1. Likely acute or subacute nonhemorrhagic infarct in the anterior left temporal lobe measuring up to 3.8 cm. 2. Moderate confluent periventricular and subcortical white matter hypoattenuation, stable. 3. ASPECTS: 8. CT Angio Head & Neck>>IMPRESSION: 1. No acute large vessel occlusion. 2. Extensive predominantly calcified plaque along the proximal right cervical ICA with positive remodeling resulting in less than 50% stenosis. Medications Administered: TNK @ 15:15  She was evaluated by Neurology and decision was made to administer TNK.  At 14:48 she was agreeable for TNK, however  changed her man as did not want an IV stick.  At 15:01 after encouraging discussion with ED provider and Neurology, she was agreeable to IV stick in order to receive TNK.  PCCM asked to admit for further workup and treatment.  Please see Significant Hospital Events section below for full detailed hospital course.   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   required intubation   Anemia    C3 cervical fracture (HCC) 04/2023   Cervical stress fracture    CHF (congestive heart failure) (HCC)    combined systolic and diastolic   Coronary artery disease    ESRD on dialysis (HCC)    M-W-Fr   GERD (gastroesophageal reflux disease)    Hyperlipidemia    Hypertension    Pneumonia    Secondary hyperparathyroidism of renal origin    Stroke [facility])    with right sided weakness   Type 2 diabetes mellitus with diabetic neuropathy (HCC)    Ulnar neuropathy at elbow of right upper extremity     Micro Data:  N/A  Antimicrobials:   Anti-infectives (From admission, onward)    None        Significant Hospital Events: Including procedures, antibiotic start and stop dates in addition to other pertinent events   11/21: Presented to ED as Code Stroke with right facial droop, RUE weakness/numbness, and slurred speech.  Evaluated by Neurology, received TNK.  PCCM asked to admit to ICU.   Interim History / Subjective:  As outlined above under Significant Hospital Events section  Objective   Blood pressure (!) 147/71, pulse 92, resp. rate 19, height 5' 1 (1.549 m), weight 75.8  kg, SpO2 100%.       No intake or output data in the 24 hours ending 08/15/24 1554 Filed Weights   08/15/24 1512  Weight: 75.8 kg    Examination: General: Acute on chronically ill appearing female, laying in bed, on room air, in NAD HENT: Atraumatic, normocephalic, neck supple, no JVD Lungs: Clear breath sounds throughout, even,  nonlabored, normal effort Cardiovascular: RRR, s1s2, no M/R/G Abdomen: Soft, nontender, nondistended, no guarding or rebound tenderness, BS+ x4 Extremities: Weakness to  Neuro: Neuro exam limited as pt is not cooperative: Awake and alert, oriented x3, speech is slurred, + right sided facial droop, refuses to move legs due to hip pain, decreased sensation to right face and RUE GU: Deferred  Resolved Hospital Problem list     Assessment & Plan:   #Acute vs subacute Ischemic CVA of the anterior left temporal lobe s/p TNK PMHx: CVA with right sided deficits, HTN, HLD, CHF -CT Head w/o contrast: acute vs subacute nonhemorrhagic infarct to the anterior left temporal lobe -CTa Head & Neck: no large vessel occlusion, Extensive predominantly calcified plaque along the proximal right cervical ICA with positive remodeling resulting in less than 50% stenosis -ICU monitoring -Frequent neurochecks and NIHSS scores per post-TNK protocol -Neurology following, appreciate input -Obtain stat head CT for any change in neurological exam -MRI brain pending 24 hours post TNKase  to rule out hemorrhagic conversion -No antiplatelets or anticoagulation for 24 hours post TNKase , until ICH ruled out on repeat imaging at 24 hours -Maintain SBP <180 and/or DBP <105 -As needed labetalol  and nicardipine drip if needed to maintain blood pressure goal -SCDs for DVT prophylaxis -N.p.o., bedside swallow study pending -PT/OT/speech therapy consult -Check hemoglobin A1c and fasting lipid panel -Echocardiogram is pending -Check UDS  #ESRD on Hemodialysis -Monitor I&O's / urinary output -Follow BMP -Ensure adequate renal perfusion -Avoid nephrotoxic agents as able -Replace electrolytes as indicated ~ Pharmacy following for assistance with electrolyte replacement -Consult Nephrology, appreciate input   #Normocytic Normochromic Anemia PMHx: Anemia -Monitor for S/Sx of bleeding -Trend CBC -SCD's for VTE Prophylaxis   -Transfuse for Hgb <7 -Transfuse Platelets for Platelet count < 10K; < 50K with active bleeding; < 100K with Neurosurgical procedures/processes   #Diabetes Mellitus -CBG's q4h; Target range of 140 to 180 -SSI -Follow ICU Hypo/Hyperglycemia protocol           Best Practice (right click and Reselect all SmartList Selections daily)   Diet/type: NPO, bedside swallow evaluation pending  DVT prophylaxis: SCD (no chemical DVT ppx for 24 hrs post TNK) GI prophylaxis: PPI Lines: N/A Foley:  N/A Code Status:  full code Last date of multidisciplinary goals of care discussion [N/a]  11/21: Pt updated at bedside on plan of care.  Called and updated pt's son Yulissa Needham via telephone.   Labs   CBC: Recent Labs  Lab 08/15/24 1427  WBC 7.9  NEUTROABS 5.5  HGB 11.2*  HCT 35.2*  MCV 86.3  PLT 186    Basic Metabolic Panel: Recent Labs  Lab 08/15/24 1427  NA 139  K 4.3  CL 96*  CO2 29  GLUCOSE 98  BUN 12  CREATININE 2.72*  CALCIUM  8.8*   GFR: Estimated Creatinine Clearance: 17.4 mL/min (A) (by C-G formula based on SCr of 2.72 mg/dL (H)). Recent Labs  Lab 08/15/24 1427  WBC 7.9    Liver Function Tests: Recent Labs  Lab 08/15/24 1427  AST 24  ALT 17  ALKPHOS 174*  BILITOT 0.4  PROT 8.5*  ALBUMIN  4.3   No results for input(s): LIPASE, AMYLASE in the last 168 hours. No results for input(s): AMMONIA in the last 168 hours.  ABG    Component Value Date/Time   PHART 7.43 01/15/2022 0034   PCO2ART 47 01/15/2022 0034   PO2ART 126 (H) 01/15/2022 0034   HCO3 24.4 01/20/2022 1928   TCO2 29 11/08/2023 0649   ACIDBASEDEF 3.8 (H) 01/20/2022 1928   O2SAT 58.2 01/20/2022 1928     Coagulation Profile: Recent Labs  Lab 08/15/24 1427  INR 1.0    Cardiac Enzymes: No results for input(s): CKTOTAL, CKMB, CKMBINDEX, TROPONINI in the last 168 hours.  HbA1C: Hgb A1c MFr Bld  Date/Time Value Ref Range Status  08/05/2023 09:07 AM 5.2 4.8 - 5.6  % Final    Comment:    (NOTE) Pre diabetes:          5.7%-6.4%  Diabetes:              >6.4%  Glycemic control for   <7.0% adults with diabetes   12/27/2021 06:11 AM 5.5 4.8 - 5.6 % Final    Comment:    (NOTE)         Prediabetes: 5.7 - 6.4         Diabetes: >6.4         Glycemic control for adults with diabetes: <7.0     CBG: Recent Labs  Lab 08/15/24 1428  GLUCAP 91    Review of Systems:   Positives in BOLD: Gen: Denies fever, chills, weight change, fatigue, night sweats HEENT: Denies blurred vision, double vision, hearing loss, tinnitus, sinus congestion, rhinorrhea, sore throat, neck stiffness, dysphagia PULM: Denies shortness of breath, cough, sputum production, hemoptysis, wheezing CV: Denies chest pain, edema, orthopnea, paroxysmal nocturnal dyspnea, palpitations GI: Denies abdominal pain, nausea, vomiting, diarrhea, hematochezia, melena, constipation, change in bowel habits GU: Denies dysuria, hematuria, polyuria, oliguria, urethral discharge Endocrine: Denies hot or cold intolerance, polyuria, polyphagia or appetite change Derm: Denies rash, dry skin, scaling or peeling skin change Heme: Denies easy bruising, bleeding, bleeding gums Neuro: Denies headache, numbness, weakness, slurred speech, loss of memory or consciousness   Past Medical History:  She,  has a past medical history of ACE inhibitor-aggravated angioedema (12/2021), Anemia, C3 cervical fracture (HCC) (04/2023), Cervical stress fracture, CHF (congestive heart failure) (HCC), Coronary artery disease, ESRD on dialysis (HCC), GERD (gastroesophageal reflux disease), Hyperlipidemia, Hypertension, Pneumonia, Secondary hyperparathyroidism of renal origin, Stroke (HCC), Type 2 diabetes mellitus with diabetic neuropathy (HCC), and Ulnar neuropathy at elbow of right upper extremity.   Surgical History:   Past Surgical History:  Procedure Laterality Date   A/V FISTULAGRAM Right 06/28/2023   Procedure: A/V  Fistulagram;  Surgeon: Marea Selinda RAMAN, MD;  Location: [facility];  Service: Cardiovascular;  Laterality: Right;   A/V FISTULAGRAM Left 01/31/2024   Procedure: A/V Fistulagram;  Surgeon: Marea Selinda RAMAN, MD;  Location: [facility];  Service: Cardiovascular;  Laterality: Left;   AV FISTULA PLACEMENT Right 05/17/2023   Procedure: INSERTION OF ARTERIOVENOUS (AV) GORE-TEX GRAFT ARM;  Surgeon: Marea Selinda RAMAN, MD;  Location: [facility];  Service: Vascular;  Laterality: Right;   CARDIAC CATHETERIZATION Left 03/01/2016   Procedure: Left Heart Cath and Coronary Angiography;  Surgeon: Vinie DELENA Jude, MD;  Location: [facility];  Service: Cardiovascular;  Laterality: Left;   CESAREAN SECTION     DIALYSIS/PERMA CATHETER INSERTION Right 01/10/2022   Procedure: DIALYSIS/PERMA CATHETER INSERTION;  Surgeon: Tisa Curry DELENA,  MD;  Location: [facility];  Service: Cardiovascular;  Laterality: Right;   DIALYSIS/PERMA CATHETER INSERTION N/A 04/12/2022   Procedure: DIALYSIS/PERMA CATHETER INSERTION;  Surgeon: Marea Selinda RAMAN, MD;  Location: [facility];  Service: Cardiovascular;  Laterality: N/A;   DIALYSIS/PERMA CATHETER REMOVAL N/A 09/03/2023   Procedure: DIALYSIS/PERMA CATHETER REMOVAL;  Surgeon: Marea Selinda RAMAN, MD;  Location: [facility];  Service: Cardiovascular;  Laterality: N/A;   INTUBATION-ENDOTRACHEAL WITH TRACHEOSTOMY STANDBY Left 01/14/2022   Procedure: INTUBATION-NASAL WITH TRACHEOSTOMY STANDBY;  Surgeon: Herminio Miu, MD;  Location: [facility];  Service: ENT;  Laterality: Left;   LOOP RECORDER INSERTION Left 08/08/2023   Procedure: LOOP RECORDER INSERTION;  Surgeon: Cindie Ole DASEN, MD;  Location: [facility];  Service: Cardiovascular;  Laterality: Left;   LOWER EXTREMITY ANGIOGRAPHY Right 07/23/2023   Procedure: Lower Extremity Angiography;  Surgeon: Marea Selinda RAMAN, MD;  Location: [facility];  Service: Cardiovascular;  Laterality: Right;   LOWER  EXTREMITY ANGIOGRAPHY Left 01/03/2024   Procedure: Lower Extremity Angiography;  Surgeon: Marea Selinda RAMAN, MD;  Location: [facility];  Service: Cardiovascular;  Laterality: Left;   NERVE TRANSFER Right 11/08/2023   Procedure: ANTERIOR INTEROSSEOUS NERVE TO ULNAR NERVE TRANSFER, END TO SIDE;  Surgeon: Claudene Penne ORN, MD;  Location: [facility];  Service: Neurosurgery;  Laterality: Right;   TEMPORARY DIALYSIS CATHETER N/A 01/04/2022   Procedure: TEMPORARY DIALYSIS CATHETER;  Surgeon: Marea Selinda RAMAN, MD;  Location: [facility];  Service: Cardiovascular;  Laterality: N/A;   ULNAR NERVE DECOMPRESSION Right 11/08/2023   Procedure: RIGHT ULNAR NERVE DECOMPRESSION;  Surgeon: Claudene Penne ORN, MD;  Location: [facility];  Service: Neurosurgery;  Laterality: Right;     Social History:   reports that she has never smoked. She has never used smokeless tobacco. She reports that she does not drink alcohol and does not use drugs.   Family History:  Her family history includes Osteoarthritis in her brother.   Allergies Allergies  Allergen Reactions   Ace Inhibitors Swelling   Entresto  [Sacubitril -Valsartan ] Swelling    Patient said she took both lisinopril and Entresto  so it is not clear which one she actually reacted to.  For safety reasons, she has been advised to avoid both ACE inhibitors and Entresto .   Lisinopril Swelling    Severe Angioedema (requiring Nasotracheal intubation)   Peanut-Containing Drug Products    Penicillins Rash   Tomato Rash and Other (See Comments)     Home Medications  Prior to Admission medications   Medication Sig Start Date End Date Taking? Authorizing Provider  ammonium lactate (AMLACTIN) 12 % cream Apply topically. 10/08/23 10/07/24  [provider]  aspirin  EC 81 MG tablet Take 81 mg by mouth daily.    [provider]  atorvastatin  (LIPITOR) 40 MG tablet Take 40 mg by mouth daily.    [provider]  calcium  acetate (PHOSLO ) 667 MG  capsule Take 1,334 mg by mouth 3 (three) times daily. 09/13/23   [provider]  clopidogrel  (PLAVIX ) 75 MG tablet Take 75 mg by mouth at bedtime.    [provider]  ferrous sulfate  325 (65 FE) MG EC tablet Take 325 mg by mouth in the morning. Patient not taking: Reported on 06/24/2024 08/16/21 06/19/24  [provider]  gabapentin  (NEURONTIN ) 100 MG capsule Take 100 mg by mouth 2 (two) times daily as needed.    [provider]  Menthol, Topical Analgesic, (BIOFREEZE COOL THE PAIN)  4 % GEL Apply topically.    [provider]  metoprolol  tartrate (LOPRESSOR ) 25 MG tablet Take 1 tablet (25 mg total) by mouth 2 (two) times daily. 01/13/22 06/26/24  Gherghe, Costin M, MD  midodrine  (PROAMATINE ) 5 MG tablet Take 5 mg by mouth 2 (two) times daily with a meal. 02/22/24   [provider]  ondansetron  (ZOFRAN -ODT) 4 MG disintegrating tablet  10/22/23   [provider]  oxyCODONE -acetaminophen  (PERCOCET) 5-325 MG tablet Take 1 tablet by mouth every 4 (four) hours as needed for severe pain (pain score 7-10). 08/09/23   Patel, Sona, MD  sertraline  (ZOLOFT ) 25 MG tablet Take 25 mg by mouth daily.    [provider]  Vitamin D, Ergocalciferol, (DRISDOL) 1.25 MG (50000 UNIT) CAPS capsule Take 50,000 Units by mouth once a week. Wednesday 10/12/21   [provider]     Critical care time: 55 minutes     Inge Lecher, AGACNP-BC [facility] Prefer epic messenger for cross cover needs If after hours, please call E-link

## 2024-08-15 NOTE — Progress Notes (Signed)
   08/15/24 1530  Spiritual Encounters  Type of Visit Initial  Care provided to: Patient  Conversation partners present during encounter Nurse  Referral source Code page (Code Stroke (1430))  Reason for visit Code  OnCall Visit Yes  Spiritual Framework  Presenting Themes Caregiving needs;Impactful experiences and emotions;Rituals and practive  Community/Connection Family  Needs/Challenges/Barriers Doesn't want to be in the hospital.  Patient Stress Factors Health changes;Lack of caregivers  Family Stress Factors None identified  Goals  Self/Personal Goals Rest.  Interventions  Spiritual Care Interventions Made Reflective listening;Compassionate presence;Other (comment) (Breathing and rest to relax the body.)  Intervention Outcomes  Outcomes Reduced anxiety;Reduced fear  Spiritual Care Plan  Spiritual Care Issues Still Outstanding Referring to oncoming chaplain for further support  Recommendations for Clinical Staff Patient loved the warm blanket which seemed to make her feel more safe and secure.   Chaplain sat with patient until her breathing evened out and she seemed more relaxed and drifted off to sleep.  Patient shared that prayer helps her but Chaplain never prayed because she wanted the patient to regulate and self-soothe first.    Rev. Rana M. Nicholaus, M.Div. Chaplain Resident [facility]

## 2024-08-16 ENCOUNTER — Inpatient Hospital Stay

## 2024-08-16 ENCOUNTER — Inpatient Hospital Stay (HOSPITAL_COMMUNITY)
Admit: 2024-08-16 | Discharge: 2024-08-16 | Disposition: A | Discharge status: Home / Self Care | Attending: Neurology | Admitting: Neurology

## 2024-08-16 DIAGNOSIS — Z992 Dependence on renal dialysis: Secondary | ICD-10-CM

## 2024-08-16 DIAGNOSIS — I63522 Cerebral infarction due to unspecified occlusion or stenosis of left anterior cerebral artery: Secondary | ICD-10-CM | POA: Diagnosis not present

## 2024-08-16 DIAGNOSIS — E785 Hyperlipidemia, unspecified: Secondary | ICD-10-CM

## 2024-08-16 DIAGNOSIS — E1122 Type 2 diabetes mellitus with diabetic chronic kidney disease: Secondary | ICD-10-CM

## 2024-08-16 DIAGNOSIS — I132 Hypertensive heart and chronic kidney disease with heart failure and with stage 5 chronic kidney disease, or end stage renal disease: Secondary | ICD-10-CM

## 2024-08-16 DIAGNOSIS — N186 End stage renal disease: Secondary | ICD-10-CM

## 2024-08-16 DIAGNOSIS — I6389 Other cerebral infarction: Secondary | ICD-10-CM

## 2024-08-16 DIAGNOSIS — E119 Type 2 diabetes mellitus without complications: Secondary | ICD-10-CM | POA: Diagnosis not present

## 2024-08-16 DIAGNOSIS — E1140 Type 2 diabetes mellitus with diabetic neuropathy, unspecified: Secondary | ICD-10-CM

## 2024-08-16 DIAGNOSIS — I5040 Unspecified combined systolic (congestive) and diastolic (congestive) heart failure: Secondary | ICD-10-CM | POA: Diagnosis not present

## 2024-08-16 LAB — ECHOCARDIOGRAM COMPLETE
Height: 61 in
Weight: 2515.01 [oz_av]

## 2024-08-16 LAB — CBC
HCT: 31.2 % — ABNORMAL LOW (ref 36.0–46.0)
Hemoglobin: 9.9 g/dL — ABNORMAL LOW (ref 12.0–15.0)
MCH: 27.6 pg (ref 26.0–34.0)
MCHC: 31.7 g/dL (ref 30.0–36.0)
MCV: 86.9 fL (ref 80.0–100.0)
Platelets: 189 K/uL (ref 150–400)
RBC: 3.59 MIL/uL — ABNORMAL LOW (ref 3.87–5.11)
RDW: 17.9 % — ABNORMAL HIGH (ref 11.5–15.5)
WBC: 7.7 K/uL (ref 4.0–10.5)
nRBC: 0 % (ref 0.0–0.2)

## 2024-08-16 LAB — RENAL FUNCTION PANEL
Albumin: 3.8 g/dL (ref 3.5–5.0)
Anion gap: 12 (ref 5–15)
BUN: 18 mg/dL (ref 8–23)
CO2: 26 mmol/L (ref 22–32)
Calcium: 8.2 mg/dL — ABNORMAL LOW (ref 8.9–10.3)
Chloride: 99 mmol/L (ref 98–111)
Creatinine, Ser: 3.65 mg/dL — ABNORMAL HIGH (ref 0.44–1.00)
GFR, Estimated: 13 mL/min — ABNORMAL LOW (ref >60)
Glucose, Bld: 78 mg/dL (ref 70–99)
Phosphorus: 4.4 mg/dL (ref 2.5–4.6)
Potassium: 3.9 mmol/L (ref 3.5–5.1)
Sodium: 137 mmol/L (ref 135–145)

## 2024-08-16 LAB — LIPID PANEL
Cholesterol: 194 mg/dL (ref 0–200)
HDL: 70 mg/dL (ref >40)
LDL Cholesterol: 105 mg/dL — ABNORMAL HIGH (ref 0–99)
Total CHOL/HDL Ratio: 2.8 ratio
Triglycerides: 95 mg/dL (ref <150)
VLDL: 19 mg/dL (ref 0–40)

## 2024-08-16 LAB — GLUCOSE, CAPILLARY
Glucose-Capillary: 133 mg/dL — ABNORMAL HIGH (ref 70–99)
Glucose-Capillary: 141 mg/dL — ABNORMAL HIGH (ref 70–99)
Glucose-Capillary: 83 mg/dL (ref 70–99)
Glucose-Capillary: 84 mg/dL (ref 70–99)
Glucose-Capillary: 89 mg/dL (ref 70–99)
Glucose-Capillary: 94 mg/dL (ref 70–99)

## 2024-08-16 LAB — HEMOGLOBIN A1C
Hgb A1c MFr Bld: 5.7 % — ABNORMAL HIGH (ref 4.8–5.6)
Mean Plasma Glucose: 116.89 mg/dL

## 2024-08-16 MED ORDER — HEPARIN SODIUM (PORCINE) 1000 UNIT/ML DIALYSIS: 1000.0000 [IU] | INTRAMUSCULAR | Status: DC | PRN

## 2024-08-16 MED ORDER — SERTRALINE HCL 50 MG PO TABS
100.0000 mg | ORAL_TABLET | Freq: Every day | ORAL | Status: DC
  Administered 2024-08-18 – 2024-08-22 (×3): 100 mg via ORAL
  Filled 2024-08-16 (×17): qty 2

## 2024-08-16 MED ORDER — PENTAFLUOROPROP-TETRAFLUOROETH EX AERO
1.0000 | INHALATION_SPRAY | CUTANEOUS | Status: DC | PRN
Start: 2024-08-16 — End: 2024-08-17

## 2024-08-16 MED ORDER — CALCIUM ACETATE (PHOS BINDER) 667 MG PO CAPS
667.0000 mg | ORAL_CAPSULE | Freq: Three times a day (TID) | ORAL | Status: AC
Start: 2024-08-16 — End: ?
  Administered 2024-08-18 – 2024-08-27 (×8): 667 mg via ORAL
  Filled 2024-08-16 (×24): qty 1

## 2024-08-16 MED ORDER — ANTICOAGULANT SODIUM CITRATE 4% (200MG/5ML) IV SOLN: 5.0000 mL | Status: DC | PRN

## 2024-08-16 MED ORDER — CHLORHEXIDINE GLUCONATE CLOTH 2 % EX PADS
6.0000 | MEDICATED_PAD | Freq: Every day | CUTANEOUS | Status: DC
  Administered 2024-08-20 – 2024-09-05 (×5): 6 via TOPICAL

## 2024-08-16 MED ORDER — ALTEPLASE 2 MG IJ SOLR: 2.0000 mg | Freq: Once | INTRAMUSCULAR | Status: DC | PRN

## 2024-08-16 MED ORDER — HEPARIN SODIUM (PORCINE) 1000 UNIT/ML IJ SOLN
2000.0000 [IU] | Freq: Once | INTRAMUSCULAR | Status: AC
  Administered 2024-08-16: 1600 [IU] via INTRAVENOUS
  Filled 2024-08-16: qty 2

## 2024-08-16 MED ORDER — CHLORHEXIDINE GLUCONATE CLOTH 2 % EX PADS
6.0000 | MEDICATED_PAD | Freq: Every day | CUTANEOUS | Status: DC
  Administered 2024-08-16: 6 via TOPICAL

## 2024-08-16 MED ORDER — ATORVASTATIN CALCIUM 20 MG PO TABS
40.0000 mg | ORAL_TABLET | Freq: Every day | ORAL | Status: DC
  Administered 2024-08-16 – 2024-09-05 (×13): 40 mg via ORAL
  Filled 2024-08-16 (×18): qty 2

## 2024-08-16 NOTE — Progress Notes (Incomplete)
 NEUROLOGY CONSULT FOLLOW UP NOTE   Date of service: August 16, 2024 Patient Name: Deanna [name] MRN:  [id number] DOB:  1951/01/04  Interval Hx/subjective  Poorly cooperative in the context of irritability today.   Vitals   Vitals:   08/16/24 1500 08/16/24 1600 08/16/24 1700 08/16/24 1800  BP: (!) 146/73  (!) 137/59 133/70  Pulse: 88  86 84  Resp: 14  17 17   Temp:  98.7 F (37.1 C)    TempSrc:  Axillary    SpO2:      Weight:      Height:         Body mass index is 29.7 kg/m.  Physical Exam   Constitutional: Appears well-developed and well-nourished.  Psych: Anxious and mildly dysthymic affect is appropriate to situation.  Eyes: No scleral injection.  HENT: No OP obstruction.  Head: Normocephalic.  Respiratory: Effort normal, non-labored breathing.  Skin: WDI.   Neurologic Examination   Ment: Awake and alert. Speech fluent. Improved dysarthria, but still present. Irritable. Refuses to cooperate with exam.  CN: Patient lying on side and refuses to move to assess for facial symmetry. Phonation intact. Gross EOMI.  Motor: RUE 3-4/5. LUE 5/5. RLE 4/5 plantar flexion, otherwise noncooperative. LLE 4/5 plantar flexion with poor effort.  Sensory: Subjectively decreased on the right. Refuses detailed exam.  Reflexes: Non-cooperative.  Cerebellar: Non-cooperative Gait: Deferred.   Medications  Current Facility-Administered Medications:    acetaminophen  (TYLENOL ) tablet 650 mg, 650 mg, Oral, Q4H PRN, 650 mg at 08/16/24 0755 **OR** acetaminophen  (TYLENOL ) 160 MG/5ML solution 650 mg, 650 mg, Per Tube, Q4H PRN **OR** acetaminophen  (TYLENOL ) suppository 650 mg, 650 mg, Rectal, Q4H PRN, Torunn Chancellor, MD   alteplase  (CATHFLO ACTIVASE ) injection 2 mg, 2 mg, Intracatheter, Once PRN, Levorn Ramonita SQUIBB, NP   anticoagulant sodium citrate  solution 5 mL, 5 mL, Intracatheter, PRN, Levorn Ramonita SQUIBB, NP   atorvastatin  (LIPITOR) tablet 40 mg, 40 mg, Oral, Daily, Assaker, Jean-Pierre,  MD, 40 mg at 08/16/24 1258   calcium  acetate (PHOSLO ) capsule 667 mg, 667 mg, Oral, TID WC, Levorn Ramonita SQUIBB, NP   [START ON 08/17/2024] Chlorhexidine  Gluconate Cloth 2 % PADS 6 each, 6 each, Topical, Q0600, Levorn Ramonita P, NP   heparin  injection 1,000 Units, 1,000 Units, Intracatheter, PRN, Levorn Ramonita SQUIBB, NP   insulin  aspart (novoLOG ) injection 0-6 Units, 0-6 Units, Subcutaneous, Q4H, Shellia Mann D, NP   pantoprazole  (PROTONIX ) injection 40 mg, 40 mg, Intravenous, QHS, Dymphna Wadley, MD   pentafluoroprop-tetrafluoroeth (GEBAUERS) aerosol 1 Application, 1 Application, Topical, PRN, Levorn, [place] P, NP   senna-docusate (Senokot-S) tablet 1 tablet, 1 tablet, Oral, QHS PRN, Jacai Kipp, MD   sertraline  (ZOLOFT ) tablet 100 mg, 100 mg, Oral, Daily, Assaker, Jean-Pierre, MD   sodium chloride  flush (NS) 0.9 % injection 3 mL, 3 mL, Intravenous, Once, Jossie Artist POUR, MD  Labs and Diagnostic Imaging   CBC:  Recent Labs  Lab 08/15/24 1427 08/16/24 0548  WBC 7.9 7.7  NEUTROABS 5.5  --   HGB 11.2* 9.9*  HCT 35.2* 31.2*  MCV 86.3 86.9  PLT 186 189    Basic Metabolic Panel:  Lab Results  Component Value Date   NA 137 08/16/2024   K 3.9 08/16/2024   CO2 26 08/16/2024   GLUCOSE 78 08/16/2024   BUN 18 08/16/2024   CREATININE 3.65 (H) 08/16/2024   CALCIUM  8.2 (L) 08/16/2024   GFRNONAA 13 (L) 08/16/2024   GFRAA 36 (L) 12/09/2017   Lipid Panel:  Lab Results  Component Value Date   LDLCALC 105 (H) 08/16/2024   HgbA1c:  Lab Results  Component Value Date   HGBA1C 5.7 (H) 08/15/2024   Urine Drug Screen:     Component Value Date/Time   LABOPIA NONE DETECTED 12/27/2021 0244   COCAINSCRNUR NONE DETECTED 12/27/2021 0244   LABBENZ NONE DETECTED 12/27/2021 0244   AMPHETMU NONE DETECTED 12/27/2021 0244   THCU NONE DETECTED 12/27/2021 0244   LABBARB NONE DETECTED 12/27/2021 0244    Alcohol Level     Component Value Date/Time   ETH <15 08/15/2024 1427   INR  Lab  Results  Component Value Date   INR 1.0 08/15/2024   APTT  Lab Results  Component Value Date   APTT 30 08/15/2024   TTE: 1. The mitral valve is grossly normal. No evidence of mitral stenosis by  visual assessment.   2. The aortic valve has an indeterminant number of cusps. There is  moderate calcification of the aortic valve. No aortic stenosis is present  by visual assessment.  Due to patient behavioral factors, only 3 images were able  to be obtained. Unable to accurately evaluate LV function, valvular  function, or other cardiac causes of CVA. Recommend repeat study when  patient is agreeable.   Assessment  Deanna [name] is a 73 y.o. female with PMHx of ACE-I aggravated angioedema requiring intubation, anemia, C3 fracture, combined systolic and diastolic CHF, CAD, ESRD on HD, HLD, HTN, secondary hyperparathyroidism, strokes x 2 with residual right sided weakness, DM2, diabetic neuropathy and ulnar neuropathy who presented to the ED via EMS from her dialysis center on Friday for acute onset of slurred speech and facial droop there. She was also experiencing worsened weakness/numbness of the right upper extremity relative to her baseline. TNK was administered and she was admitted to the ICU for post-TNK monitoring and stroke work up.  - Exam today reveals improved dysarthria. Right side is with gross weakness but able to move in the context of poor cooperation with exam.   - Imaging:  - CT head: Likely acute or subacute nonhemorrhagic infarct in the anterior left temporal lobe measuring up to 3.8 cm. Moderate confluent periventricular and subcortical white matter hypoattenuation, stable. ASPECTS: 8. - CTA of head and neck: No acute large vessel occlusion. Extensive predominantly calcified plaque along the proximal right cervical ICA with positive remodeling resulting in less than 50% stenosis. - MRI brain: Acute nonhemorrhagic infarct in the posterior left frontal lobe involving the  primary motor cortex, with associated T2/FLAIR hyperintensity. Confluent periventricular and subcortical T2 hyperintensities bilaterally, increased from the prior study. Remote infarcts in the anterior right insular cortex, right frontal lobe operculum, and lateral left temporal lobe are new since the prior exam. - EKG: Sinus rhythm; Ventricular premature complex; Posterior infarct, old; Repol abnrm suggests ischemia, anterolateral - Impression:  - Acute onset of right sided motor deficits and dysarthria secondary to acute posterior left frontal lobe ischemic infarction.  - DDx for etiology includes cardioembolic, atherothrombotic and chronic cerebral small vessel disease   Recommendations  - Restarting ASA and Plavix   - DVT prophylaxis .  - Continue her statin.  - Nephrology consulted. Planning on dialysis Sunday.  - Repeat TTE when able. Would order as a bubble study. Initial TTE without sufficient windows to rule out  cardiac causes of CVA  - PT/OT/Speech.  - May need inpatient rehabilitation - Sliding scale insulin .  - Telemetry monitoring - Fasting lipid panel, HgbA1c - No further recommendations at this time. Neurohospitalist  service will follow PRN. Please call/page if repeat TTE shows findings predisposing to stroke, including PFO, valvular vegetation or mural thrombus.  - Outpatient Neurology follow up.   ______________________________________________________________________   Bonney SHARK, Lisbeth Puller, MD [facility]

## 2024-08-16 NOTE — Progress Notes (Signed)
 Patient continues to refuse assessments and treatment. Again offered oral care/water to complete her swallow screen. She stated Just leave me alone while refusing to open her eyes. She has also refused her 0000 CBG collection, stating Do it later! This RN again attempted to educate the patient on the importance of the assessments and specimen collection and she became more agitated with any continued talking. Patient remains on the monitor and this RN will continue to attempt interventions and assessments. ICU NP Ouma aware.

## 2024-08-16 NOTE — Progress Notes (Signed)
 At approximately 0230, she pressed the call button for assistance. This RN responded and this time she was more willing to cooperate and became more interactive. With the help of another RN, the patient was repositioned in a better position of comfort. She was tearful and with encouragement she expressed her distrust of healthcare providers and others due to the treatment she has received during past medical events, particularly at [facility]. This RN encouraged her to believe that there are many people who truly do want to help her and she can trust the staff here. She nodded in understanding and seemed to relax. She was still unwilling to attempt a swallow eval or a full NIHSS.

## 2024-08-16 NOTE — Progress Notes (Signed)
 PT Cancellation Note  Patient Details Name: Deanna [name] MRN: [id number] DOB: 1951/05/24   Cancelled Treatment:    Reason Eval/Treat Not Completed: Other (comment) Chart reviewed. Pt is s/p TNK. Will wait for bedrest order to be removed and repeat imaging before completing PT eval.   Allena Bulls, SPT   Allena Bulls 08/16/2024, 10:13 AM

## 2024-08-16 NOTE — Progress Notes (Signed)
   08/16/24 0730  Spiritual Encounters  Type of Visit Follow up  Care provided to: Patient  Conversation partners present during encounter Nurse  Reason for visit Routine spiritual support  OnCall Visit Yes  Spiritual Framework  Presenting Themes Rituals and practive;Coping tools  Values/beliefs Faith tradition.  Strengths Patient shares her concerns openly.  Patient Stress Factors Health changes;Loss of control;Major life changes  Family Stress Factors None identified  Goals  Clinical Care Goals Hydration and nourishment.  Interventions  Spiritual Care Interventions Made Prayer;Normalization of emotions;Reflective listening;Compassionate presence  Intervention Outcomes  Outcomes Reduced anxiety;Reduced fear  Spiritual Care Plan  Spiritual Care Issues Still Outstanding Referring to oncoming chaplain for further support  Recommendations for Clinical Staff Patient wants to have meal and sit up better in the bed.   Chaplain followed up with patient who she met in the ED last night.  Patient was agitated because she wanted to eat, sit up in the bed and her dominant hand is numb. Chaplain prayed with patient and shared with staff who were already ready to assist the patient.  Rev. Rana M. Nicholaus, M.Div. Chaplain Resident [facility]

## 2024-08-16 NOTE — Progress Notes (Addendum)
 NAMEReegan [name], MRN:  [id number], DOB:  Aug 28, 1951, LOS: 1 ADMISSION DATE:  08/15/2024 History of Present Illness:  Deanna [name] is a 73 y.o. female with a past medical history significant for 73 y.o. female with PMHx significant for CVA with right sided deficits, ESRD on HD, CHF, HTN, HLD, anemia, GERD, and Diabetes Mellitus who presented to [facility] ED on 08/15/24 as a CODE STROKE.   She as hemodialysis today, and at around 12:00 noon dialysis staff noted right sided facial droop, slurred speech, and weakness/numbness of the right upper extremity.  She denies dizziness, headache, blurred vision, chest pain, shortness of breath, abdominal pain, nausea/vomiting/diarrhea, or dysuria.    ED Course: Initial Vital Signs: pulse 92, RR 16, BP 164/79, SpO2 98% on room air Significant Labs: Creatinine 2.7, Alkaline phosphatase 174, hemoglobin 11.2, hematocrit 35.2 Ethyl alcohol <15 Imaging CT Head>>IMPRESSION: 1. Likely acute or subacute nonhemorrhagic infarct in the anterior left temporal lobe measuring up to 3.8 cm. 2. Moderate confluent periventricular and subcortical white matter hypoattenuation, stable. 3. ASPECTS: 8. CT Angio Head & Neck>>IMPRESSION: 1. No acute large vessel occlusion. 2. Extensive predominantly calcified plaque along the proximal right cervical ICA with positive remodeling resulting in less than 50% stenosis. Medications Administered: TNK @ 15:15   She was evaluated by Neurology and decision was made to administer TNK.  At 14:48 she was agreeable for TNK, however changed her man as did not want an IV stick.  At 15:01 after encouraging discussion with ED provider and Neurology, she was agreeable to IV stick in order to receive TNK.   PCCM asked to admit for further workup and treatment.   Please see Significant Hospital Events section below for full detailed hospital course.  Pertinent  Medical History  As above.   Significant Hospital Events: Including  procedures, antibiotic start and stop dates in addition to other pertinent events   11/21: Presented to ED as Code Stroke with right facial droop, RUE weakness/numbness, and slurred speech.  Evaluated by Neurology, received TNK.  PCCM asked to admit to ICU.   Interim History / Subjective:  Patient awake and alert without major complaints.  Right neurodeficits with some improvement and speech with some improvement.  Pending MRI today.   Objective    Blood pressure 130/64, pulse 88, temperature 98 F (36.7 C), temperature source Oral, resp. rate 19, height 5' 1 (1.549 m), weight 71.3 kg, SpO2 100%.        Intake/Output Summary (Last 24 hours) at 08/16/2024 0841 Last data filed at 08/15/2024 2322 Gross per 24 hour  Intake 0 ml  Output --  Net 0 ml   Filed Weights   08/15/24 1512 08/15/24 1701  Weight: 75.8 kg 71.3 kg    Examination: General: no acute distress, resting in bed, chronically ill HEENT: [place]/AT, moist mucous membranes, sclera anicteric Neuro: A&O x 3, moving all extremities CV: rrr, s1s2, no murmurs PULM: clear to auscultation bilaterally. No wheezing GI: soft, non-tender, non-distended, BS+ Extremities: warm, no edema  Labs and imaging were reviewed.  Assessment and Plan  #Acute CVA with slurred speech, right sided deficits s/p TNK 15:30. CTA head and neck wo an LVO. CT head stroke concerning for subacute infarct in the anterior left temporal area.  #ESRD on HD #Type II DM    [] Q1H neurochecks.  []  Goal normotensive.  []  MRI in 24 hours 08/16/24 14:00  []  Appreciate neurology consult.  []  Echocardiogram  []  PT/OT. Speech eval. []  Start high dose stating  once passes speech eval.   []  Antiplatelets and AC per Neurology.   Critical care time: 36 minutes    Darrin Barn, MD  Pulmonary Critical Care 08/16/2024 8:43 AM

## 2024-08-16 NOTE — Progress Notes (Addendum)
   This RN helped rub her right hand 3 different times as she has been complaining of numbness. She stated she usually get percocet for pain. It was explained that the neurologist will not want her having anything that could impact her mentation. She stated it is just a muscle relaxer and does not effect her like that. She agreed to her labs being drawn from her permcath but after a short period of being agreeable to some care, the patient became agitated and verbally aggressive toward this RN. Once done with her blood draw, she began raising her voice and demanding food and wanting to leave again. She voiced frustration in not being able to get what she wants despite extensive education attempts being provided for why those requests are not safe. This RN repeatedly attempted to explain the process of swallow screens and their purpose of evaluating a patient's ability to swallow safely. She yelled at this RN, I have had strokes before and I never had a problem with swallowing so give me some damn water. She is still refusing to participate in the process the [facility] system has put in place for swallow evals following a stroke. This RN explained to the patient that the policy and procedure will be followed and until she is willing to cooperate she will not be allowed to have anything PO. The patient continues to raise her voice despite requests to not yell at this RN.   These situations were discussed in depth with NP Ouma. She has agreed that the patient must follow the policy and procedure.

## 2024-08-16 NOTE — Plan of Care (Signed)
  Problem: Self-Care: Goal: Verbalization of feelings and concerns over difficulty with self-care will improve Outcome: Progressing Goal: Ability to communicate needs accurately will improve Outcome: Progressing   Problem: Clinical Measurements: Goal: Respiratory complications will improve Outcome: Progressing Goal: Cardiovascular complication will be avoided Outcome: Progressing   Problem: Education: Goal: Knowledge of disease or condition will improve Outcome: Not Progressing Goal: Knowledge of secondary prevention will improve (MUST DOCUMENT ALL) Outcome: Not Progressing   Problem: Ischemic Stroke/TIA Tissue Perfusion: Goal: Complications of ischemic stroke/TIA will be minimized Outcome: Not Progressing   Problem: Coping: Goal: Will verbalize positive feelings about self Outcome: Not Progressing Goal: Will identify appropriate support needs Outcome: Not Progressing   Problem: Health Behavior/Discharge Planning: Goal: Ability to manage health-related needs will improve Outcome: Not Progressing Goal: Goals will be collaboratively established with patient/family Outcome: Not Progressing   Problem: Self-Care: Goal: Ability to participate in self-care as condition permits will improve Outcome: Not Progressing   Problem: Nutrition: Goal: Risk of aspiration will decrease Outcome: Not Progressing Goal: Dietary intake will improve Outcome: Not Progressing   Problem: Education: Goal: Knowledge of General Education information will improve Description: Including pain rating scale, medication(s)/side effects and non-pharmacologic comfort measures Outcome: Not Progressing   Problem: Health Behavior/Discharge Planning: Goal: Ability to manage health-related needs will improve Outcome: Not Progressing   Problem: Nutrition: Goal: Adequate nutrition will be maintained Outcome: Not Progressing   Problem: Coping: Goal: Level of anxiety will decrease Outcome: Not  Progressing   Problem: Pain Managment: Goal: General experience of comfort will improve and/or be controlled Outcome: Not Progressing   Problem: Fluid Volume: Goal: Ability to maintain a balanced intake and output will improve Outcome: Not Progressing

## 2024-08-16 NOTE — Progress Notes (Signed)
 [facility]  ROUNDING NOTE   Subjective:  [name] is a 73 y.o.  female with a past medical history of ESRD on dialysis, coronary artery disease, CVA, status post PCI, CHF with reduced ejection fraction, diabetes mellitus, and hypertension. Patient presents to the ED from her dialysis center with acute onset of slurred speech and facial droop there. She was also experiencing worsened weakness/numbness of the right upper extremity relative to her baseline. Code Stroke was called.    Patient is known to our practice and receives outpatient dialysis treatments at [facility] on a MWF schedule, supervised by Dr. Marcelino.   Patient sitting up in bed eating. On room air. Appears agitated.    Objective:  Vital signs in last 24 hours:  Temp:  [97.8 F (36.6 C)-98 F (36.7 C)] 98 F (36.7 C) (11/21 2000) Pulse Rate:  [81-99] 88 (11/22 0900) Resp:  [10-34] 19 (11/22 0900) BP: (98-164)/(54-96) 137/74 (11/22 0900) SpO2:  [93 %-100 %] 93 % (11/22 0900) Weight:  [71.3 kg-75.8 kg] 71.3 kg (11/21 1701)  Weight change:  Filed Weights   08/15/24 1512 08/15/24 1701  Weight: 75.8 kg 71.3 kg    Intake/Output: No intake/output data recorded.   Intake/Output this shift:  No intake/output data recorded.  Physical Exam: General: NAD  Head: Normocephalic  Neck: Supple, trachea midline  Lungs:  Clear, room air  Heart: Regular   Abdomen:  Soft,   Extremities:  Trace peripheral edema.  Neurologic: aggitated  Skin: dry  Access: Rt chest Permcath    Basic Metabolic Panel: Recent Labs  Lab 08/15/24 1427 08/16/24 0548  NA 139 137  K 4.3 3.9  CL 96* 99  CO2 29 26  GLUCOSE 98 78  BUN 12 18  CREATININE 2.72* 3.65*  CALCIUM  8.8* 8.2*  PHOS  --  4.4    Liver Function Tests: Recent Labs  Lab 08/15/24 1427 08/16/24 0548  AST 24  --   ALT 17  --   ALKPHOS 174*  --   BILITOT 0.4  --   PROT 8.5*  --   ALBUMIN  4.3 3.8   No results for input(s): LIPASE,  AMYLASE in the last 168 hours. No results for input(s): AMMONIA in the last 168 hours.  CBC: Recent Labs  Lab 08/15/24 1427 08/16/24 0548  WBC 7.9 7.7  NEUTROABS 5.5  --   HGB 11.2* 9.9*  HCT 35.2* 31.2*  MCV 86.3 86.9  PLT 186 189    Cardiac Enzymes: No results for input(s): CKTOTAL, CKMB, CKMBINDEX, TROPONINI in the last 168 hours.  BNP: Invalid input(s): POCBNP  CBG: Recent Labs  Lab 08/15/24 1702 08/15/24 2109 08/16/24 0411 08/16/24 0801 08/16/24 1122  GLUCAP 78 91 94 84 141*    Microbiology: Results for orders placed or performed during the hospital encounter of 01/20/22  Blood culture (routine x 2)     Status: None   Collection Time: 01/20/22  7:22 PM   Specimen: BLOOD  Result Value Ref Range Status   Specimen Description BLOOD BLOOD RIGHT HAND  Final   Special Requests   Final    BOTTLES DRAWN AEROBIC AND ANAEROBIC Blood Culture results may not be optimal due to an inadequate volume of blood received in culture bottles   Culture   Final    NO GROWTH 5 DAYS Performed at [facility], [address], [place], [place] [address]    Report Status 01/25/2022 FINAL  Final  Blood culture (routine x 2)  Status: None   Collection Time: 01/20/22  7:22 PM   Specimen: BLOOD  Result Value Ref Range Status   Specimen Description BLOOD BLOOD LEFT HAND  Final   Special Requests   Final    BOTTLES DRAWN AEROBIC AND ANAEROBIC Blood Culture results may not be optimal due to an inadequate volume of blood received in culture bottles   Culture   Final    NO GROWTH 5 DAYS Performed at [facility], [address] [place], [place] [address]    Report Status 01/25/2022 FINAL  Final  Resp Panel by RT-PCR (Flu A&B, Covid) Nasopharyngeal Swab     Status: None   Collection Time: 01/20/22  7:28 PM   Specimen: Nasopharyngeal Swab; Nasopharyngeal(NP) swabs in vial transport medium  Result Value Ref Range Status   SARS Coronavirus 2 by RT PCR  NEGATIVE NEGATIVE Final    Comment: (NOTE) SARS-CoV-2 target nucleic acids are NOT DETECTED.  The SARS-CoV-2 RNA is generally detectable in upper respiratory specimens during the acute phase of infection. The lowest concentration of SARS-CoV-2 viral copies this assay can detect is 138 copies/mL. A negative result does not preclude SARS-Cov-2 infection and should not be used as the sole basis for treatment or other patient management decisions. A negative result may occur with  improper specimen collection/handling, submission of specimen other than nasopharyngeal swab, presence of viral mutation(s) within the areas targeted by this assay, and inadequate number of viral copies(<138 copies/mL). A negative result must be combined with clinical observations, patient history, and epidemiological information. The expected result is Negative.  Fact Sheet for Patients:  bloggercourse.com  Fact Sheet for Healthcare Providers:  seriousbroker.it  This test is no t yet approved or cleared by the [place]  FDA and  has been authorized for detection and/or diagnosis of SARS-CoV-2 by FDA under an Emergency Use Authorization (EUA). This EUA will remain  in effect (meaning this test can be used) for the duration of the COVID-19 declaration under Section 564(b)(1) of the Act, 21 U.S.C.section 360bbb-3(b)(1), unless the authorization is terminated  or revoked sooner.       Influenza A by PCR NEGATIVE NEGATIVE Final   Influenza B by PCR NEGATIVE NEGATIVE Final    Comment: (NOTE) The Xpert Xpress SARS-CoV-2/FLU/RSV plus assay is intended as an aid in the diagnosis of influenza from Nasopharyngeal swab specimens and should not be used as a sole basis for treatment. Nasal washings and aspirates are unacceptable for Xpert Xpress SARS-CoV-2/FLU/RSV testing.  Fact Sheet for Patients: bloggercourse.com  Fact Sheet for  Healthcare Providers: seriousbroker.it  This test is not yet approved or cleared by the [place]  FDA and has been authorized for detection and/or diagnosis of SARS-CoV-2 by FDA under an Emergency Use Authorization (EUA). This EUA will remain in effect (meaning this test can be used) for the duration of the COVID-19 declaration under Section 564(b)(1) of the Act, 21 U.S.C. section 360bbb-3(b)(1), unless the authorization is terminated or revoked.  Performed at [facility], [address] [place], [place] [address]     Coagulation Studies: Recent Labs    08/15/24 1427  LABPROT 13.8  INR 1.0    Urinalysis: No results for input(s): COLORURINE, LABSPEC, PHURINE, GLUCOSEU, HGBUR, BILIRUBINUR, KETONESUR, PROTEINUR, UROBILINOGEN, NITRITE, LEUKOCYTESUR in the last 72 hours.  Invalid input(s): APPERANCEUR    Imaging: ECHOCARDIOGRAM COMPLETE Result Date: 08/16/2024    ECHOCARDIOGRAM REPORT   Patient Name:   LY WASS Date of Exam: 08/16/2024 Medical Rec #:  [id number]  Height:       61.0 in Accession #:    [id number]       Weight:       157.2 lb Date of Birth:  12/11/1950        BSA:          1.705 m Patient Age:    73 years         BP:           130/64 mmHg Patient Gender: F                HR:           89 bpm. Exam Location:  [facility] Procedure: 2D Echo (Both Spectral and Color Flow Doppler were utilized during            procedure). Indications:     Stroke I63.9  History:         Patient has prior history of Echocardiogram examinations, most                  recent 08/08/2023.  Sonographer:     Thedora Louder RDCS, FASE Referring Phys:  [phone] ERIC LINDZEN Diagnosing Phys: Caron Poser  Sonographer Comments: No apical window and no subcostal window. Image acquisition challenging due to uncooperative patient and Image acquisition challenging due to patient behavioral factors. IMPRESSIONS  1. The mitral valve is  grossly normal. No evidence of mitral stenosis by visual assessment.  2. The aortic valve has an indeterminant number of cusps. There is moderate calcification of the aortic valve. No aortic stenosis is present by visual assessment. Comparison(s): Due to patient behavioral factors, only 3 images were able to be obtained. Unable to accurately evaluate LV function, valvular function, or other cardiac causes of CVA. Recommend repeat study when patient is agreeable. FINDINGS  Left Ventricle: Pericardium: There is no evidence of pericardial effusion. Mitral Valve: The mitral valve is grossly normal. No evidence of mitral valve stenosis. Aortic Valve: The aortic valve has an indeterminant number of cusps. There is moderate calcification of the aortic valve. No aortic stenosis is present. Caron Poser Electronically signed by Caron Poser Signature Date/Time: 08/16/2024/10:36:58 AM    Final    CT ANGIO HEAD NECK W WO CM Result Date: 08/15/2024 EXAM: CTA HEAD AND NECK WITHOUT AND WITH 08/15/2024 03:10:25 PM TECHNIQUE: CTA of the head and neck was performed without and with the administration of intravenous contrast. Multiplanar 2D and/or 3D reformatted images are provided for review. Automated exposure control, iterative reconstruction, and/or weight based adjustment of the mA/kV was utilized to reduce the radiation dose to as low as reasonably achievable. Stenosis of the internal carotid arteries measured using NASCET criteria. COMPARISON: CT head 08/15/2024. CLINICAL HISTORY: Stroke/TIA, determine embolic source. FINDINGS: CTA NECK: AORTIC ARCH AND ARCH VESSELS: No dissection or arterial injury. No significant stenosis of the brachiocephalic or subclavian arteries. CERVICAL CAROTID ARTERIES: Extensive predominantly calcified plaque along the proximal right cervical ICA with positive remodeling resulting in less than 50% stenosis. Mild calcified plaque along the left carotid bulb and proximal left cervical ICA  without significant stenosis. No dissection or arterial injury. CERVICAL VERTEBRAL ARTERIES: No dissection, arterial injury, or significant stenosis. LUNGS AND MEDIASTINUM: 5 mm solid nodular opacity in the anterior right upper lobe segment seen on axial image 16 series 4. SOFT TISSUES: No acute abnormality. 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 No high grade spinal canal stenosis. No suspicious bone lesion. CTA HEAD: ANTERIOR  CIRCULATION: Atherosclerotic calcifications of the ICA siphons without significant stenosis or aneurysm. No significant stenosis of the anterior cerebral arteries. No significant stenosis of the middle cerebral arteries. POSTERIOR CIRCULATION: No significant stenosis of the posterior cerebral arteries. No significant stenosis of the basilar artery. No significant stenosis of the vertebral arteries. No aneurysm. OTHER: No dural venous sinus thrombosis on this non-dedicated study. IMPRESSION: 1. No acute large vessel occlusion. 2. Extensive predominantly calcified plaque along the proximal right cervical ICA with positive remodeling resulting in less than 50% stenosis. 3. These results were communicated to Dr. Lindzen at 3:18 PM on 08/15/2024 by secure text page via the [facility] messaging system. Electronically signed by: Ryan Chess MD 08/15/2024 03:21 PM EST RP Workstation: [id number]   CT HEAD CODE STROKE WO CONTRAST Result Date: 08/15/2024 EXAM: CT HEAD WITHOUT 08/15/2024 02:38:58 PM TECHNIQUE: CT of the head was performed without the administration of intravenous contrast. Automated exposure control, iterative reconstruction, and/or weight based adjustment of the mA/kV was utilized to reduce the radiation dose to as low as reasonably achievable. COMPARISON: 07/31/2024 CLINICAL HISTORY: Neuro deficit, acute, stroke suspected. FINDINGS: BRAIN AND VENTRICLES: An age indeterminate nonhemorrhagic infarct is present in the anterior  left temporal lobe measuring up to 3.8 cm, likely acute or subacute. Moderate confluent periventricular and subcortical white matter hypoattenuation is otherwise stable. No acute intracranial hemorrhage. No mass effect or midline shift. No extra-axial fluid collection. No hydrocephalus. Atherosclerotic calcifications are present in the cavernous carotid arteries bilaterally. No hyperdense vessel is present. ORBITS: Bilateral lens replacements are noted. The globes and orbits are otherwise within normal limits. SINUSES AND MASTOIDS: No acute abnormality. SOFT TISSUES AND SKULL: No acute skull fracture. No acute soft tissue abnormality. [place] Stroke Program Early CT Score (ASPECTS) ----- Ganglionic (caudate, IC, lentiform nucleus, insula, M1-M3): 5 Supraganglionic (M4-M6): 3 Total: 8 IMPRESSION: 1. Likely acute or subacute nonhemorrhagic infarct in the anterior left temporal lobe measuring up to 3.8 cm. 2. Moderate confluent periventricular and subcortical white matter hypoattenuation, stable. 3. ASPECTS: 8. 4. These results were communicated to Dr. Lindzen at 2:44 PM on 08/15/2024 by secure text page via the [facility] messaging system. Electronically signed by: Lonni Necessary MD 08/15/2024 02:45 PM EST RP Workstation: [id number]     Medications:     atorvastatin   40 mg Oral Daily   Chlorhexidine  Gluconate Cloth  6 each Topical Daily   insulin  aspart  0-6 Units Subcutaneous Q4H   pantoprazole  (PROTONIX ) IV  40 mg Intravenous QHS   sertraline   100 mg Oral Daily   sodium chloride  flush  3 mL Intravenous Once   acetaminophen  **OR** acetaminophen  (TYLENOL ) oral liquid 160 mg/5 mL **OR** acetaminophen , senna-docusate  Assessment/ Plan:  [name] is a 73 y.o.  female with a past medical history of ESRD on dialysis, CVA, coronary artery disease, status post PCI, CHF with reduced ejection fraction, diabetes mellitus, and hypertension, Patient presents to the ED from her dialysis center with  acute onset of slurred speech and facial droop there. She was also experiencing worsened weakness/numbness of the right upper extremity relative to her baseline. Code Stroke was called.    [facility]/MWF/Rt PC/74.0 kg   Acute versus subacute ischemic CVA anterior left temporal lobe status post tenecteplase  administration.  History of recurrent CVA. Loop recorder placed on 08/08/23. Prescribed aspirin  and Plavix  outpatient. CT head-likely acute or subacute nonhemorrhagic infarct in the anterior left temporal lobe measuring up to moderate confluent subcortical white matter hypoattenuation stable.  MRI brain ordered.  2.   End-stage renal disease on hemodialysis.    Plan for dialysis tomorrow, holiday scheduling   3. Anemia of chronic kidney disease Recent Labs       Lab Results  Component Value Date    HGB 9.9 (L) 08/16/2024      Patient receives Mircera at outpatient clinic.  Hgb at goal. Will continue to monitor and assess need for ESA.    4. Secondary Hyperparathyroidism: with outpatient labs: PTH 652, phosphorus 5.2, corr calcium  08/06/2024    Latest Reference Range & Units 08/16/24 05:48  Calcium  8.9 - 10.3 mg/dL 8.2 (L)  Phosphorus 2.5 - 4.6 mg/dL 4.4  (L): Data is abnormally low  Patient prescribed calcitriol, Cinacalcet and calcium  acetate outpatient.  5.  Hypertension chronic kidney disease  Patient currently not on any antihypertensive medications. BP 157/87 today       LOS: 1 Zaylen Susman P Omari Mcmanaway 11/22/20252:11 PM

## 2024-08-16 NOTE — Progress Notes (Signed)
 OT Cancellation Note  Patient Details Name: Deanna [name] MRN: [id number] DOB: 04-10-1951   Cancelled Treatment:    Reason Eval/Treat Not Completed: Medical issues which prohibited therapy. Per secure chat and chart review, pt s/p TNK, will wait for bedrest order to be removed and updated imaging before proceeding to OT evaluation.  Larraine Colas M.S. OTR/L  08/16/24, 11:48 AM

## 2024-08-16 NOTE — Progress Notes (Signed)
  Echocardiogram 2D Echocardiogram has been performed. Unable to complete the echo due to patient being very uncooperative and combative. Limited images were acquired at this time.  Deanna [name] Louder 08/16/2024, 8:20 AM

## 2024-08-16 NOTE — Plan of Care (Signed)
 The patient completed her swallow test without incident. The patient intermittently refused parts of the [facility] stroke assessment despite education and explanation of importance of this nurse completing such.   Problem: Coping: Goal: Will identify appropriate support needs Outcome: Progressing   Problem: Nutrition: Goal: Dietary intake will improve Outcome: Progressing   Problem: Clinical Measurements: Goal: Ability to maintain clinical measurements within normal limits will improve Outcome: Progressing

## 2024-08-16 NOTE — Progress Notes (Signed)
 SLP Cancellation Note  Patient Details Name: Koula Venier MRN: [id number] DOB: 02-May-1951   Cancelled treatment:       Reason Eval/Treat Not Completed: SLP screened, no needs identified, will sign off  Pt admitted for acute CVA with noted facial asymmetry and slurred speech. Pt passed swallow screen this AM with RN- no reported concern for swallow function- now on a regular diet. Regarding speech/language, pt reporting that speech is improving. Reported primary concern is right hand and denied concern for cognitive changes. Pt deferred further assessment at this time. RN notified.   If concerns for speech/language presents, please re-consult.   Pama Roskos Clapp, MS, CCC-SLP Speech Language Pathologist Rehab Services; [facility] [phone] (ascom)     Duc Crocket J Clapp 08/16/2024, 10:42 AM

## 2024-08-16 NOTE — Progress Notes (Signed)
 Patient pressed call button and when this RN responded she demanded something to eat. Again, it was explained that she must complete a swallow screen in order to ensure her safety with swallowing. She became agitated, raising her voice saying she does not want water, she wants food and has not eaten for 2 days. She again is demanding to leave and go home. It was again explained that due to the medicine she received for her stroke that would be very dangerous and she could have more bleeding. She then demanded to see the provider.   NP E. Ouma came to the bedside to discuss with patient. Patient raised her voice again demanding to leave and demanding food. She refused the swallow screen again. NP Ouma reiterated the education already provided about the dangers of leaving the hospital and the risks of aspiration/choking if she is given food without completing a swallow screen. The patient continued to demand to leave. NP Ouma explained that the living facility where she lives will not accept her due to the events that lead to her admission and no one will transport her either. She continues to be agitated and verbally aggressive.   She has again been offered water and a chance to complete a swallow evaluation. She again refused. NP Ouma notified the patient that if she can find a way home on her own she can sign AMA paperwork but the staff are not able to assist her in that. She continues to be argumentative and unwilling to cooperate with staff.

## 2024-08-17 DIAGNOSIS — I639 Cerebral infarction, unspecified: Secondary | ICD-10-CM

## 2024-08-17 LAB — CBC
HCT: 31.4 % — ABNORMAL LOW (ref 36.0–46.0)
Hemoglobin: 10 g/dL — ABNORMAL LOW (ref 12.0–15.0)
MCH: 27.4 pg (ref 26.0–34.0)
MCHC: 31.8 g/dL (ref 30.0–36.0)
MCV: 86 fL (ref 80.0–100.0)
Platelets: 191 K/uL (ref 150–400)
RBC: 3.65 MIL/uL — ABNORMAL LOW (ref 3.87–5.11)
RDW: 17.4 % — ABNORMAL HIGH (ref 11.5–15.5)
WBC: 7.7 K/uL (ref 4.0–10.5)
nRBC: 0 % (ref 0.0–0.2)

## 2024-08-17 LAB — RENAL FUNCTION PANEL
Albumin: 3.7 g/dL (ref 3.5–5.0)
Anion gap: 13 (ref 5–15)
BUN: 29 mg/dL — ABNORMAL HIGH (ref 8–23)
CO2: 24 mmol/L (ref 22–32)
Calcium: 8.2 mg/dL — ABNORMAL LOW (ref 8.9–10.3)
Chloride: 98 mmol/L (ref 98–111)
Creatinine, Ser: 5.15 mg/dL — ABNORMAL HIGH (ref 0.44–1.00)
GFR, Estimated: 8 mL/min — ABNORMAL LOW (ref >60)
Glucose, Bld: 85 mg/dL (ref 70–99)
Phosphorus: 5.3 mg/dL — ABNORMAL HIGH (ref 2.5–4.6)
Potassium: 4.2 mmol/L (ref 3.5–5.1)
Sodium: 135 mmol/L (ref 135–145)

## 2024-08-17 LAB — GLUCOSE, CAPILLARY
Glucose-Capillary: 89 mg/dL (ref 70–99)
Glucose-Capillary: 78 mg/dL (ref 70–99)
Glucose-Capillary: 97 mg/dL (ref 70–99)
Glucose-Capillary: 83 mg/dL (ref 70–99)

## 2024-08-17 LAB — HEPATITIS B SURFACE ANTIGEN: Hepatitis B Surface Ag: NONREACTIVE

## 2024-08-17 MED ORDER — ASPIRIN 81 MG PO TBEC
81.0000 mg | DELAYED_RELEASE_TABLET | Freq: Every day | ORAL | Status: DC
  Administered 2024-08-18 – 2024-09-05 (×18): 81 mg via ORAL
  Filled 2024-08-17 (×19): qty 1

## 2024-08-17 MED ORDER — PANTOPRAZOLE SODIUM 40 MG PO TBEC
40.0000 mg | DELAYED_RELEASE_TABLET | Freq: Every day | ORAL | Status: DC
  Administered 2024-08-18 – 2024-08-29 (×4): 40 mg via ORAL
  Filled 2024-08-17 (×13): qty 1

## 2024-08-17 MED ORDER — HEPARIN SODIUM (PORCINE) 1000 UNIT/ML IJ SOLN
INTRAMUSCULAR | Status: AC
  Filled 2024-08-17: qty 4

## 2024-08-17 MED ORDER — CLOPIDOGREL BISULFATE 75 MG PO TABS
75.0000 mg | ORAL_TABLET | Freq: Every day | ORAL | Status: DC
  Administered 2024-08-18 – 2024-09-05 (×17): 75 mg via ORAL
  Filled 2024-08-17 (×18): qty 1

## 2024-08-17 NOTE — Progress Notes (Signed)
 [facility]  ROUNDING NOTE   Subjective:   Patient is known to our practice and receives outpatient dialysis treatments at [facility] on a MWF schedule, supervised by Dr. Marcelino.  Update: Patient seen and evaluated on dialysis. Verbal consent with son over the phone since patient cannot move right arm to sign. No issues with dialysis. Patient agitated with loss of function to right hand.  Hemodialysis dialysis treatment flowsheet  Blood flow rate (mL/min): 399 Arterial pressures (mmHg): -123.43 Venous pressures (mmHg): 184.03 TMP (mmHg): 8.68 Ultrafiltration rate (mL/min): 829 Dialysate (mL/min): 300    Objective:  Vital signs in last 24 hours:  Temp:  [97.6 F (36.4 C)-98.7 F (37.1 C)] 97.7 F (36.5 C) (11/23 1216) Pulse Rate:  [73-105] 101 (11/23 1216) Resp:  [12-21] 16 (11/23 1216) BP: (80-157)/(59-106) 119/66 (11/23 1216) SpO2:  [98 %-100 %] 100 % (11/23 1141) Weight:  [71.4 kg-74.4 kg] 71.4 kg (11/23 1141)  Weight change:  Filed Weights   08/15/24 1701 08/17/24 0755 08/17/24 1141  Weight: 71.3 kg 74.4 kg 71.4 kg    Intake/Output: No intake/output data recorded.   Intake/Output this shift:  Total I/O In: -  Out: 1600 [Other:1600]  Physical Exam: General: NAD, facial droop  Head: Normocephalic  Lungs:  Clear, room air  Heart: Regular   Abdomen:  Soft,   Extremities:  Trace peripheral edema.  Neurologic: Alert, awake  Skin: dry  Access: Rt chest Permcath    Basic Metabolic Panel: Recent Labs  Lab 08/15/24 1427 08/16/24 0548 08/17/24 0803  NA 139 137 135  K 4.3 3.9 4.2  CL 96* 99 98  CO2 29 26 24   GLUCOSE 98 78 85  BUN 12 18 29*  CREATININE 2.72* 3.65* 5.15*  CALCIUM  8.8* 8.2* 8.2*  PHOS  --  4.4 5.3*    Liver Function Tests: Recent Labs  Lab 08/15/24 1427 08/16/24 0548 08/17/24 0803  AST 24  --   --   ALT 17  --   --   ALKPHOS 174*  --   --   BILITOT 0.4  --   --   PROT 8.5*  --   --   ALBUMIN  4.3 3.8 3.7   No  results for input(s): LIPASE, AMYLASE in the last 168 hours. No results for input(s): AMMONIA in the last 168 hours.  CBC: Recent Labs  Lab 08/15/24 1427 08/16/24 0548 08/17/24 0802  WBC 7.9 7.7 7.7  NEUTROABS 5.5  --   --   HGB 11.2* 9.9* 10.0*  HCT 35.2* 31.2* 31.4*  MCV 86.3 86.9 86.0  PLT 186 189 191    Cardiac Enzymes: No results for input(s): CKTOTAL, CKMB, CKMBINDEX, TROPONINI in the last 168 hours.  BNP: Invalid input(s): POCBNP  CBG: Recent Labs  Lab 08/16/24 1602 08/16/24 2032 08/16/24 2348 08/17/24 0354 08/17/24 1215  GLUCAP 89 133* 83 89 78    Microbiology: Results for orders placed or performed during the hospital encounter of 01/20/22  Blood culture (routine x 2)     Status: None   Collection Time: 01/20/22  7:22 PM   Specimen: BLOOD  Result Value Ref Range Status   Specimen Description BLOOD BLOOD RIGHT HAND  Final   Special Requests   Final    BOTTLES DRAWN AEROBIC AND ANAEROBIC Blood Culture results may not be optimal due to an inadequate volume of blood received in culture bottles   Culture   Final    NO GROWTH 5 DAYS Performed at [facility],  [address] [place], [place] [address]    Report Status 01/25/2022 FINAL  Final  Blood culture (routine x 2)     Status: None   Collection Time: 01/20/22  7:22 PM   Specimen: BLOOD  Result Value Ref Range Status   Specimen Description BLOOD BLOOD LEFT HAND  Final   Special Requests   Final    BOTTLES DRAWN AEROBIC AND ANAEROBIC Blood Culture results may not be optimal due to an inadequate volume of blood received in culture bottles   Culture   Final    NO GROWTH 5 DAYS Performed at [facility], [address] [place], [place] [address]    Report Status 01/25/2022 FINAL  Final  Resp Panel by RT-PCR (Flu A&B, Covid) Nasopharyngeal Swab     Status: None   Collection Time: 01/20/22  7:28 PM   Specimen: Nasopharyngeal Swab; Nasopharyngeal(NP) swabs in vial  transport medium  Result Value Ref Range Status   SARS Coronavirus 2 by RT PCR NEGATIVE NEGATIVE Final    Comment: (NOTE) SARS-CoV-2 target nucleic acids are NOT DETECTED.  The SARS-CoV-2 RNA is generally detectable in upper respiratory specimens during the acute phase of infection. The lowest concentration of SARS-CoV-2 viral copies this assay can detect is 138 copies/mL. A negative result does not preclude SARS-Cov-2 infection and should not be used as the sole basis for treatment or other patient management decisions. A negative result may occur with  improper specimen collection/handling, submission of specimen other than nasopharyngeal swab, presence of viral mutation(s) within the areas targeted by this assay, and inadequate number of viral copies(<138 copies/mL). A negative result must be combined with clinical observations, patient history, and epidemiological information. The expected result is Negative.  Fact Sheet for Patients:  bloggercourse.com  Fact Sheet for Healthcare Providers:  seriousbroker.it  This test is no t yet approved or cleared by the [place]  FDA and  has been authorized for detection and/or diagnosis of SARS-CoV-2 by FDA under an Emergency Use Authorization (EUA). This EUA will remain  in effect (meaning this test can be used) for the duration of the COVID-19 declaration under Section 564(b)(1) of the Act, 21 U.S.C.section 360bbb-3(b)(1), unless the authorization is terminated  or revoked sooner.       Influenza A by PCR NEGATIVE NEGATIVE Final   Influenza B by PCR NEGATIVE NEGATIVE Final    Comment: (NOTE) The Xpert Xpress SARS-CoV-2/FLU/RSV plus assay is intended as an aid in the diagnosis of influenza from Nasopharyngeal swab specimens and should not be used as a sole basis for treatment. Nasal washings and aspirates are unacceptable for Xpert Xpress SARS-CoV-2/FLU/RSV testing.  Fact  Sheet for Patients: bloggercourse.com  Fact Sheet for Healthcare Providers: seriousbroker.it  This test is not yet approved or cleared by the [place]  FDA and has been authorized for detection and/or diagnosis of SARS-CoV-2 by FDA under an Emergency Use Authorization (EUA). This EUA will remain in effect (meaning this test can be used) for the duration of the COVID-19 declaration under Section 564(b)(1) of the Act, 21 U.S.C. section 360bbb-3(b)(1), unless the authorization is terminated or revoked.  Performed at [facility], [address] [place], [place] [address]     Coagulation Studies: Recent Labs    08/15/24 1427  LABPROT 13.8  INR 1.0    Urinalysis: No results for input(s): COLORURINE, LABSPEC, PHURINE, GLUCOSEU, HGBUR, BILIRUBINUR, KETONESUR, PROTEINUR, UROBILINOGEN, NITRITE, LEUKOCYTESUR in the last 72 hours.  Invalid input(s): APPERANCEUR    Imaging: MR  BRAIN WO CONTRAST Result Date: 08/16/2024 EXAM: MRI BRAIN WITHOUT CONTRAST 08/16/2024 03:58:00 PM TECHNIQUE: Multiplanar multisequence MRI of the head/brain was performed without the administration of intravenous contrast. COMPARISON: CT head without contrast 08/15/2024 and MR head without contrast 10/11/2023. CLINICAL HISTORY: Stroke, follow up. Acute onset of slurred speech and facial droop yesterday at dialysis. FINDINGS: BRAIN AND VENTRICLES: An acute nonhemorrhagic infarct in the posterior left frontal lobe measures 2 mm. This involves the primary motor cortex. T2 and FLAIR hyperintensity is associated with the acute/subacute infarct. A remote infarct is present in the anterior right insular cortex and right frontal lobe operculum, new since the prior study. A remote lateral left temporal lobe infarct is new since the prior exam. Confluent periventricular and subcortical T2 hyperintensities are present bilaterally, increased  from the prior study. No intracranial hemorrhage. No mass. No midline shift. No hydrocephalus. The sella is unremarkable. Normal flow voids. ORBITS: No acute abnormality. SINUSES AND MASTOIDS: No acute abnormality. BONES AND SOFT TISSUES: Normal marrow signal. No acute soft tissue abnormality. IMPRESSION: 1. Acute nonhemorrhagic infarct in the posterior left frontal lobe involving the primary motor cortex, with associated T2/FLAIR hyperintensity. 2. Confluent periventricular and subcortical T2 hyperintensities bilaterally, increased from the prior study. 3. Remote infarcts in the anterior right insular cortex, right frontal lobe operculum, and lateral left temporal lobe are new since the prior exam. Electronically signed by: Lonni Necessary MD 08/16/2024 04:27 PM EST RP Workstation: [id number]   ECHOCARDIOGRAM COMPLETE Result Date: 08/16/2024    ECHOCARDIOGRAM REPORT   Patient Name:   AILI CASILLAS Date of Exam: 08/16/2024 Medical Rec #:  [id number]        Height:       61.0 in Accession #:    [id number]       Weight:       157.2 lb Date of Birth:  08/06/1951        BSA:          1.705 m Patient Age:    73 years         BP:           130/64 mmHg Patient Gender: F                HR:           89 bpm. Exam Location:  [facility] Procedure: 2D Echo (Both Spectral and Color Flow Doppler were utilized during            procedure). Indications:     Stroke I63.9  History:         Patient has prior history of Echocardiogram examinations, most                  recent 08/08/2023.  Sonographer:     Thedora Louder RDCS, FASE Referring Phys:  [phone] ERIC LINDZEN Diagnosing Phys: Caron Poser  Sonographer Comments: No apical window and no subcostal window. Image acquisition challenging due to uncooperative patient and Image acquisition challenging due to patient behavioral factors. IMPRESSIONS  1. The mitral valve is grossly normal. No evidence of mitral stenosis by visual assessment.  2. The aortic valve has an  indeterminant number of cusps. There is moderate calcification of the aortic valve. No aortic stenosis is present by visual assessment. Comparison(s): Due to patient behavioral factors, only 3 images were able to be obtained. Unable to accurately evaluate LV function, valvular function, or other cardiac causes of CVA. Recommend repeat study when patient is agreeable. FINDINGS  Left Ventricle: Pericardium:  There is no evidence of pericardial effusion. Mitral Valve: The mitral valve is grossly normal. No evidence of mitral valve stenosis. Aortic Valve: The aortic valve has an indeterminant number of cusps. There is moderate calcification of the aortic valve. No aortic stenosis is present. Caron Poser Electronically signed by Caron Poser Signature Date/Time: 08/16/2024/10:36:58 AM    Final    CT ANGIO HEAD NECK W WO CM Result Date: 08/15/2024 EXAM: CTA HEAD AND NECK WITHOUT AND WITH 08/15/2024 03:10:25 PM TECHNIQUE: CTA of the head and neck was performed without and with the administration of intravenous contrast. Multiplanar 2D and/or 3D reformatted images are provided for review. Automated exposure control, iterative reconstruction, and/or weight based adjustment of the mA/kV was utilized to reduce the radiation dose to as low as reasonably achievable. Stenosis of the internal carotid arteries measured using NASCET criteria. COMPARISON: CT head 08/15/2024. CLINICAL HISTORY: Stroke/TIA, determine embolic source. FINDINGS: CTA NECK: AORTIC ARCH AND ARCH VESSELS: No dissection or arterial injury. No significant stenosis of the brachiocephalic or subclavian arteries. CERVICAL CAROTID ARTERIES: Extensive predominantly calcified plaque along the proximal right cervical ICA with positive remodeling resulting in less than 50% stenosis. Mild calcified plaque along the left carotid bulb and proximal left cervical ICA without significant stenosis. No dissection or arterial injury. CERVICAL VERTEBRAL ARTERIES: No  dissection, arterial injury, or significant stenosis. LUNGS AND MEDIASTINUM: 5 mm solid nodular opacity in the anterior right upper lobe segment seen on axial image 16 series 4. SOFT TISSUES: No acute abnormality. 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 No high grade spinal canal stenosis. No suspicious bone lesion. CTA HEAD: ANTERIOR CIRCULATION: Atherosclerotic calcifications of the ICA siphons without significant stenosis or aneurysm. No significant stenosis of the anterior cerebral arteries. No significant stenosis of the middle cerebral arteries. POSTERIOR CIRCULATION: No significant stenosis of the posterior cerebral arteries. No significant stenosis of the basilar artery. No significant stenosis of the vertebral arteries. No aneurysm. OTHER: No dural venous sinus thrombosis on this non-dedicated study. IMPRESSION: 1. No acute large vessel occlusion. 2. Extensive predominantly calcified plaque along the proximal right cervical ICA with positive remodeling resulting in less than 50% stenosis. 3. These results were communicated to Dr. Lindzen at 3:18 PM on 08/15/2024 by secure text page via the [facility] messaging system. Electronically signed by: Ryan Chess MD 08/15/2024 03:21 PM EST RP Workstation: [id number]   CT HEAD CODE STROKE WO CONTRAST Result Date: 08/15/2024 EXAM: CT HEAD WITHOUT 08/15/2024 02:38:58 PM TECHNIQUE: CT of the head was performed without the administration of intravenous contrast. Automated exposure control, iterative reconstruction, and/or weight based adjustment of the mA/kV was utilized to reduce the radiation dose to as low as reasonably achievable. COMPARISON: 07/31/2024 CLINICAL HISTORY: Neuro deficit, acute, stroke suspected. FINDINGS: BRAIN AND VENTRICLES: An age indeterminate nonhemorrhagic infarct is present in the anterior left temporal lobe measuring up to 3.8 cm, likely acute or subacute. Moderate confluent  periventricular and subcortical white matter hypoattenuation is otherwise stable. No acute intracranial hemorrhage. No mass effect or midline shift. No extra-axial fluid collection. No hydrocephalus. Atherosclerotic calcifications are present in the cavernous carotid arteries bilaterally. No hyperdense vessel is present. ORBITS: Bilateral lens replacements are noted. The globes and orbits are otherwise within normal limits. SINUSES AND MASTOIDS: No acute abnormality. SOFT TISSUES AND SKULL: No acute skull fracture. No acute soft tissue abnormality. [place] Stroke Program Early CT Score (ASPECTS) ----- Ganglionic (caudate, IC, lentiform nucleus, insula, M1-M3): 5 Supraganglionic (M4-M6): 3 Total: 8 IMPRESSION:  1. Likely acute or subacute nonhemorrhagic infarct in the anterior left temporal lobe measuring up to 3.8 cm. 2. Moderate confluent periventricular and subcortical white matter hypoattenuation, stable. 3. ASPECTS: 8. 4. These results were communicated to Dr. Lindzen at 2:44 PM on 08/15/2024 by secure text page via the [facility] messaging system. Electronically signed by: Lonni Necessary MD 08/15/2024 02:45 PM EST RP Workstation: [id number]     Medications:     aspirin  EC  81 mg Oral Daily   atorvastatin   40 mg Oral Daily   calcium  acetate  667 mg Oral TID WC   Chlorhexidine  Gluconate Cloth  6 each Topical Q0600   clopidogrel   75 mg Oral Daily   insulin  aspart  0-6 Units Subcutaneous Q4H   pantoprazole   40 mg Oral QHS   sertraline   100 mg Oral Daily   sodium chloride  flush  3 mL Intravenous Once   acetaminophen  **OR** acetaminophen  (TYLENOL ) oral liquid 160 mg/5 mL **OR** acetaminophen , senna-docusate  Assessment/ Plan:  Ms. Pansie Guggisberg is a 73 y.o.  female with a past medical history of ESRD on dialysis, CVA, coronary artery disease, status post PCI, CHF with reduced ejection fraction, diabetes mellitus, and hypertension, Patient presents to the ED from her dialysis center with acute  onset of slurred speech and facial droop there. She was also experiencing worsened weakness/numbness of the right upper extremity relative to her baseline. Code Stroke was called.    [facility]/MWF/Rt PC/74.0 kg   Acute versus subacute ischemic CVA anterior left temporal lobe status post tenecteplase  administration.  History of recurrent CVA. Loop recorder placed on 08/08/23. Prescribed aspirin  and Plavix  outpatient. CT head-likely acute or subacute nonhemorrhagic infarct in the anterior left temporal lobe measuring up to moderate confluent subcortical white matter hypoattenuation stable.  MRI brain- Acute nonhemorrhagic infarct in the posterior left frontal lobe involving the primary motor cortex, with associated T2/FLAIR hyperintensity. Confluent periventricular and subcortical T2 hyperintensities bilaterally, increased from the prior study. Remote infarcts in the anterior right insular cortex, right frontal lobe operculum, and lateral left temporal lobe are new since the prior exam. Neurology to follow outpatient.  2.   End-stage renal disease on hemodialysis.    Patient receiving dialysis today. 1.5LUF goal   3. Anemia of chronic kidney disease Recent Labs       Lab Results  Component Value Date    HGB 10.0 (L) 08/17/2024      Patient receives Mircera at outpatient clinic.  Hgb at goal. No need for ESA for now   4. Secondary Hyperparathyroidism: with outpatient labs: PTH 652, phosphorus 5.2, corr calcium  08/06/2024    Latest Reference Range & Units 08/17/24 08:03  Calcium  8.9 - 10.3 mg/dL 8.2 (L)  Phosphorus 2.5 - 4.6 mg/dL 5.3 (H)  (L): Data is abnormally low (H): Data is abnormally high  Patient prescribed calcitriol, Cinacalcet and calcium  acetate outpatient. Continue Calcium  acetate with meals.  5.  Hypertension chronic kidney disease  Patient currently not on any antihypertensive medications. BP 119/66 today       LOS: 2 Keyla Milone P Mahek Schlesinger 11/23/20251:29  PM

## 2024-08-17 NOTE — Progress Notes (Signed)
 Hemodialysis Note:  Received patient in bed to unit. Alert and oriented. Informed consent singed and in chart.  Treatment initiated: 0805 Treatment completed: 1141  Access used: Right subclavian catheter Access issues: None  100  ml saline bolus given due to hypotension.Transported back to room, alert without acute distress. Report given to patient's RN.  Total UF removed: 1.6 Liters Medications given: None  Post HD weight: 71.4 Kg  [facility]

## 2024-08-17 NOTE — Care Management Important Message (Signed)
 Important Message  Patient Details  Name: Deanna [name] MRN: [id number] Date of Birth: 1951-08-16   Important Message Given:  Yes - Medicare IM     Hanzel Pizzo W, CMA 08/17/2024, 1:38 PM

## 2024-08-17 NOTE — Plan of Care (Signed)

## 2024-08-17 NOTE — Evaluation (Signed)
 Physical Therapy Evaluation Patient Details Name: Deanna [name] MRN: [id number] DOB: [birth year] Today's Date: 08/17/2024  History of Present Illness  Deanna [name] is a 73 y.o.  female with a past medical history of ESRD on dialysis, coronary artery disease, CVA, status post PCI, CHF with reduced ejection fraction, diabetes mellitus, and hypertension. Patient presents to the ED from her dialysis center with acute onset of slurred speech and facial droop there. She was also experiencing worsened weakness/numbness of the right upper extremity relative to her baseline.  Clinical Impression  Patient noted to be in supine position at PT and OT arrival in room, for an initial PT evaluation due to a decline in functional status, with baseline mobility reported as wheelchair, and currently requiring maxA +2 for transfers. The patient is A&O x 4, presenting with little willingness to work with PT and goals of going home, with discharge expectations that include returning to ALF. The patient resides in a ALF with family/friend support. Gait was deferred due to R side weakness UE>LE. The overall clinical impression is that the patient presents with severe mobility limitations secondary to new stroke. Recommended skilled PT will address safety, mobility, and discharge planning. PT recommendation to d/c patient to SNF upon medical clearance.        If plan is discharge home, recommend the following: Help with stairs or ramp for entrance;Assist for transportation;Two people to help with walking and/or transfers;Two people to help with bathing/dressing/bathroom   Can travel by private vehicle   No    Equipment Recommendations Other (comment) (TBD)  Recommendations for Other Services       Functional Status Assessment Patient has had a recent decline in their functional status and/or demonstrates limited ability to make significant improvements in function in a reasonable and predictable amount of  time     Precautions / Restrictions Precautions Precautions: Fall Restrictions Weight Bearing Restrictions Per Provider Order: No      Mobility  Bed Mobility Overal bed mobility: Needs Assistance Bed Mobility: Supine to Sit     Supine to sit: Mod assist, Max assist     General bed mobility comments: modA to get EOB and MaxA to scoot to put feet on floor; vc and physical assist for lower extremity and placement of RUE but unable to grasp and control RUE    Transfers Overall transfer level: Needs assistance Equipment used: Rolling walker (2 wheels) Transfers: Sit to/from Stand, Bed to chair/wheelchair/BSC Sit to Stand: Mod assist, +2 physical assistance   Step pivot transfers: Mod assist, +2 physical assistance      Lateral/Scoot Transfers: Max assist General transfer comment: physical assistance provided for UE placement on RW and control of RLE    Ambulation/Gait                  Stairs            Wheelchair Mobility     Tilt Bed    Modified Rankin (Stroke Patients Only)       Balance Overall balance assessment: Needs assistance Sitting-balance support: Feet supported Sitting balance-[name]: Fair Sitting balance - Comments: not able to use RUE to supports   Standing balance support: Reliant on assistive device for balance, During functional activity Standing balance-[name]: Poor                               Pertinent Vitals/Pain Pain Assessment Pain Assessment: No/denies pain  Home Living Family/patient expects to be discharged to:: Assisted living                 Home Equipment: Rolling Walker (2 wheels);Wheelchair - power;Wheelchair - manual      Prior Function Prior Level of Function : Independent/Modified Independent             Mobility Comments: amb short distances with RW per pt. report; but mostly using wheelchair per son on phone       Extremity/Trunk Assessment   Upper Extremity  Assessment Upper Extremity Assessment: Defer to OT evaluation    Lower Extremity Assessment Lower Extremity Assessment: Generalized weakness;RLE deficits/detail RLE Deficits / Details: limited RLE control/strength from new CVA RLE Sensation: decreased proprioception RLE Coordination: decreased gross motor    Cervical / Trunk Assessment Cervical / Trunk Assessment: Normal  Communication   Communication Communication: No apparent difficulties    Cognition Arousal: Alert Behavior During Therapy: WFL for tasks assessed/performed   PT - Cognitive impairments: No apparent impairments, Safety/Judgement                         Following commands: Intact       Cueing Cueing Techniques: Verbal cues     General Comments      Exercises     Assessment/Plan    PT Assessment Patient needs continued PT services  PT Problem List Decreased strength;Decreased balance;Decreased mobility;Decreased safety awareness       PT Treatment Interventions DME instruction;Functional mobility training;Therapeutic activities;Therapeutic exercise;Neuromuscular re-education;Balance training;Patient/family education    PT Goals (Current goals can be found in the Care Plan section)  Acute Rehab PT Goals Patient Stated Goal: Pt wants to go home PT Goal Formulation: With patient Time For Goal Achievement: 09/07/24 Potential to Achieve Goals: Fair    Frequency Min 2X/week     Co-evaluation PT/OT/SLP Co-Evaluation/Treatment: Yes Reason for Co-Treatment: Complexity of the patient's impairments (multi-system involvement);For patient/therapist safety PT goals addressed during session: Mobility/safety with mobility;Strengthening/ROM         AM-PAC PT 6 Clicks Mobility  Outcome Measure Help needed turning from your back to your side while in a flat bed without using bedrails?: A Little Help needed moving from lying on your back to sitting on the side of a flat bed without using  bedrails?: A Little Help needed moving to and from a bed to a chair (including a wheelchair)?: A Lot Help needed standing up from a chair using your arms (e.g., wheelchair or bedside chair)?: A Lot Help needed to walk in hospital room?: Total Help needed climbing 3-5 steps with a railing? : Total 6 Click Score: 12    End of Session Equipment Utilized During Treatment: Gait belt Activity Tolerance: Patient tolerated treatment well Patient left: in chair;with call bell/phone within reach;with chair alarm set Nurse Communication: Mobility status PT Visit Diagnosis: Other abnormalities of gait and mobility (R26.89);Muscle weakness (generalized) (M62.81);Hemiplegia and hemiparesis Hemiplegia - Right/Left: Right Hemiplegia - dominant/non-dominant: Dominant    Time: 1430-1500 PT Time Calculation (min) (ACUTE ONLY): 30 min   Charges:   PT Evaluation $PT Eval Low Complexity: 1 Low   PT General Charges $$ ACUTE PT VISIT: 1 Visit         Sherlean Lesches DPT, PT    Annett Boxwell A Dezhane Staten 08/17/2024, 3:45 PM

## 2024-08-17 NOTE — Progress Notes (Signed)
 Progress Note    Deanna [name]  FMW:[id number] DOB: Apr 13, 1951  DOA: 08/15/2024 PCP: Brutus Delon SAUNDERS, NP      Brief Narrative:    Medical records reviewed and are as summarized below:  Deanna [name] is a 73 y.o. female with medical history significant for CVA, ESRD on HD, extensive PAD s/p angioplasty and stent, history of HFrEF with recovered EF, hypertension, hyperlipidemia, type 2 diabetes, CAD, DISH arthritis, C3 anterior osteophyte fracture, was brought from the outpatient dialysis center to the ED because of right facial droop and slurred speech.  She also complained of numbness and weakness in the right upper extremity.  ED Course: Initial Vital Signs: pulse 92, RR 16, BP 164/79, SpO2 98% on room air Significant Labs: Creatinine 2.7, Alkaline phosphatase 174, hemoglobin 11.2, hematocrit 35.2 Ethyl alcohol <15 Imaging CT Head>>IMPRESSION: 1. Likely acute or subacute nonhemorrhagic infarct in the anterior left temporal lobe measuring up to 3.8 cm. 2. Moderate confluent periventricular and subcortical white matter hypoattenuation, stable. 3. ASPECTS: 8. CT Angio Head & Neck>>IMPRESSION: 1. No acute large vessel occlusion. 2. Extensive predominantly calcified plaque along the proximal right cervical ICA with positive remodeling resulting in less than 50% stenosis.   She was by the neurologist for acute stroke.  She was given TNK at 3:15 PM on 08/15/2024.  She was admitted to the ICU for further management.     Assessment/Plan:   Principal Problem:   Stroke (cerebrum) (HCC)   Body mass index is 29.74 kg/m.   Acute left frontal lobe nonhemorrhagic stroke: S/p tenecteplase  on 08/15/2024 at 3:15 PM. Follow-up MRI brain showed acute nonhemorrhagic infarct in the posterior left frontal lobe. Continue Lipitor, aspirin  Plavix  Limited 2D echo view because patient was not cooperative. LDL 105, hemoglobin A1c 5.7 PT OT recommended discharge to SNF.   Follow-up with TOC to assist with disposition.    ESRD on HD: She had hemodialysis today.  Follow-up with nephrologist.   Type II DM: NovoLog  as needed for hyperglycemia. Hemoglobin A1c 5.7.   Comorbidities include hyperlipidemia, hypertension, PAD s/p stent, CAD, prior history of strokes, history of C3 anterior osteophyte fracture.   Diet Order             Diet Heart Room service appropriate? Yes; Fluid consistency: Thin  Diet effective now                                  Consultants: Intensivist Neurologist Nephrologist  Procedures: None    Medications:    aspirin  EC  81 mg Oral Daily   atorvastatin   40 mg Oral Daily   calcium  acetate  667 mg Oral TID WC   Chlorhexidine  Gluconate Cloth  6 each Topical Q0600   clopidogrel   75 mg Oral Daily   insulin  aspart  0-6 Units Subcutaneous Q4H   pantoprazole   40 mg Oral QHS   sertraline   100 mg Oral Daily   sodium chloride  flush  3 mL Intravenous Once   Continuous Infusions:   Anti-infectives (From admission, onward)    None              Family Communication/Anticipated D/C date and plan/Code Status   DVT prophylaxis: SCD's Start: 08/15/24 1522     Code Status: Full Code  Family Communication: None Disposition Plan: Plan to discharge to SNF   Status is: Inpatient Remains inpatient appropriate because: Acute stroke  Subjective:   Interval events noted.  She has no complaints.  She says she is just wants to go back home.  She was working with OT at the bedside.  Objective:    Vitals:   08/17/24 1100 08/17/24 1130 08/17/24 1141 08/17/24 1216  BP: (!) 80/62 (!) 94/59 128/65 119/66  Pulse: 100 (!) 101 100 (!) 101  Resp: (!) 21 13 12 16   Temp:   97.8 F (36.6 C) 97.7 F (36.5 C)  TempSrc:   Oral Axillary  SpO2: 100% 100% 100%   Weight:   71.4 kg   Height:       No data found.   Intake/Output Summary (Last 24 hours) at 08/17/2024 1502 Last data filed at  08/17/2024 1300 Gross per 24 hour  Intake 0 ml  Output 1600 ml  Net -1600 ml   Filed Weights   08/15/24 1701 08/17/24 0755 08/17/24 1141  Weight: 71.3 kg 74.4 kg 71.4 kg    Exam:  GEN: NAD SKIN: Warm and dry EYES: EOMI, PERRLA, no pallor or icterus ENT: MMM CV: RRR PULM: CTA B ABD: soft, ND, NT, +BS CNS: AAO x 3, right upper extremity weakness (power 2/5).  Speech is clear.  No obvious facial droop noted. EXT: No edema or tenderness        Data Reviewed:   I have personally reviewed following labs and imaging studies:  Labs: Labs show the following:   Basic Metabolic Panel: Recent Labs  Lab 08/15/24 1427 08/16/24 0548 08/17/24 0803  NA 139 137 135  K 4.3 3.9 4.2  CL 96* 99 98  CO2 29 26 24   GLUCOSE 98 78 85  BUN 12 18 29*  CREATININE 2.72* 3.65* 5.15*  CALCIUM  8.8* 8.2* 8.2*  PHOS  --  4.4 5.3*   GFR Estimated Creatinine Clearance: 8.9 mL/min (A) (by C-G formula based on SCr of 5.15 mg/dL (H)). Liver Function Tests: Recent Labs  Lab 08/15/24 1427 08/16/24 0548 08/17/24 0803  AST 24  --   --   ALT 17  --   --   ALKPHOS 174*  --   --   BILITOT 0.4  --   --   PROT 8.5*  --   --   ALBUMIN  4.3 3.8 3.7   No results for input(s): LIPASE, AMYLASE in the last 168 hours. No results for input(s): AMMONIA in the last 168 hours. Coagulation profile Recent Labs  Lab 08/15/24 1427  INR 1.0    CBC: Recent Labs  Lab 08/15/24 1427 08/16/24 0548 08/17/24 0802  WBC 7.9 7.7 7.7  NEUTROABS 5.5  --   --   HGB 11.2* 9.9* 10.0*  HCT 35.2* 31.2* 31.4*  MCV 86.3 86.9 86.0  PLT 186 189 191   Cardiac Enzymes: No results for input(s): CKTOTAL, CKMB, CKMBINDEX, TROPONINI in the last 168 hours. BNP (last 3 results) No results for input(s): PROBNP in the last 8760 hours. CBG: Recent Labs  Lab 08/16/24 1602 08/16/24 2032 08/16/24 2348 08/17/24 0354 08/17/24 1215  GLUCAP 89 133* 83 89 78   D-Dimer: No results for input(s): DDIMER  in the last 72 hours. Hgb A1c: Recent Labs    08/15/24 1427  HGBA1C 5.7*   Lipid Profile: Recent Labs    08/16/24 0548  CHOL 194  HDL 70  LDLCALC 105*  TRIG 95  CHOLHDL 2.8   Thyroid function studies: No results for input(s): TSH, T4TOTAL, T3FREE, THYROIDAB in the last 72 hours.  Invalid input(s): FREET3 Anemia  work up: No results for input(s): VITAMINB12, FOLATE, FERRITIN, TIBC, IRON, RETICCTPCT in the last 72 hours. Sepsis Labs: Recent Labs  Lab 08/15/24 1427 08/16/24 0548 08/17/24 0802  WBC 7.9 7.7 7.7    Microbiology No results found for this or any previous visit (from the past 240 hours).  Procedures and diagnostic studies:  MR BRAIN WO CONTRAST Result Date: 08/16/2024 EXAM: MRI BRAIN WITHOUT CONTRAST 08/16/2024 03:58:00 PM TECHNIQUE: Multiplanar multisequence MRI of the head/brain was performed without the administration of intravenous contrast. COMPARISON: CT head without contrast 08/15/2024 and MR head without contrast 10/11/2023. CLINICAL HISTORY: Stroke, follow up. Acute onset of slurred speech and facial droop yesterday at dialysis. FINDINGS: BRAIN AND VENTRICLES: An acute nonhemorrhagic infarct in the posterior left frontal lobe measures 2 mm. This involves the primary motor cortex. T2 and FLAIR hyperintensity is associated with the acute/subacute infarct. A remote infarct is present in the anterior right insular cortex and right frontal lobe operculum, new since the prior study. A remote lateral left temporal lobe infarct is new since the prior exam. Confluent periventricular and subcortical T2 hyperintensities are present bilaterally, increased from the prior study. No intracranial hemorrhage. No mass. No midline shift. No hydrocephalus. The sella is unremarkable. Normal flow voids. ORBITS: No acute abnormality. SINUSES AND MASTOIDS: No acute abnormality. BONES AND SOFT TISSUES: Normal marrow signal. No acute soft tissue abnormality.  IMPRESSION: 1. Acute nonhemorrhagic infarct in the posterior left frontal lobe involving the primary motor cortex, with associated T2/FLAIR hyperintensity. 2. Confluent periventricular and subcortical T2 hyperintensities bilaterally, increased from the prior study. 3. Remote infarcts in the anterior right insular cortex, right frontal lobe operculum, and lateral left temporal lobe are new since the prior exam. Electronically signed by: Deanna Necessary MD 08/16/2024 04:27 PM EST RP Workstation: [id number]   ECHOCARDIOGRAM COMPLETE Result Date: 08/16/2024    ECHOCARDIOGRAM REPORT   Patient Name:   Deanna [name] Date of Exam: 08/16/2024 Medical Rec #:  [id number]        Height:       61.0 in Accession #:    [id number]       Weight:       157.2 lb Date of Birth:  07-Mar-1951        BSA:          1.705 m Patient Age:    73 years         BP:           130/64 mmHg Patient Gender: F                HR:           89 bpm. Exam Location:  [facility] Procedure: 2D Echo (Both Spectral and Color Flow Doppler were utilized during            procedure). Indications:     Stroke I63.9  History:         Patient has prior history of Echocardiogram examinations, most                  recent 08/08/2023.  Sonographer:     Thedora Louder RDCS, FASE Referring Phys:  [phone] ERIC LINDZEN Diagnosing Phys: Caron Poser  Sonographer Comments: No apical window and no subcostal window. Image acquisition challenging due to uncooperative patient and Image acquisition challenging due to patient behavioral factors. IMPRESSIONS  1. The mitral valve is grossly normal. No evidence of mitral stenosis by visual assessment.  2. The aortic valve has an indeterminant number  of cusps. There is moderate calcification of the aortic valve. No aortic stenosis is present by visual assessment. Comparison(s): Due to patient behavioral factors, only 3 images were able to be obtained. Unable to accurately evaluate LV function, valvular function, or other cardiac  causes of CVA. Recommend repeat study when patient is agreeable. FINDINGS  Left Ventricle: Pericardium: There is no evidence of pericardial effusion. Mitral Valve: The mitral valve is grossly normal. No evidence of mitral valve stenosis. Aortic Valve: The aortic valve has an indeterminant number of cusps. There is moderate calcification of the aortic valve. No aortic stenosis is present. Caron Poser Electronically signed by Caron Poser Signature Date/Time: 08/16/2024/10:36:58 AM    Final    CT ANGIO HEAD NECK W WO CM Result Date: 08/15/2024 EXAM: CTA HEAD AND NECK WITHOUT AND WITH 08/15/2024 03:10:25 PM TECHNIQUE: CTA of the head and neck was performed without and with the administration of intravenous contrast. Multiplanar 2D and/or 3D reformatted images are provided for review. Automated exposure control, iterative reconstruction, and/or weight based adjustment of the mA/kV was utilized to reduce the radiation dose to as low as reasonably achievable. Stenosis of the internal carotid arteries measured using NASCET criteria. COMPARISON: CT head 08/15/2024. CLINICAL HISTORY: Stroke/TIA, determine embolic source. FINDINGS: CTA NECK: AORTIC ARCH AND ARCH VESSELS: No dissection or arterial injury. No significant stenosis of the brachiocephalic or subclavian arteries. CERVICAL CAROTID ARTERIES: Extensive predominantly calcified plaque along the proximal right cervical ICA with positive remodeling resulting in less than 50% stenosis. Mild calcified plaque along the left carotid bulb and proximal left cervical ICA without significant stenosis. No dissection or arterial injury. CERVICAL VERTEBRAL ARTERIES: No dissection, arterial injury, or significant stenosis. LUNGS AND MEDIASTINUM: 5 mm solid nodular opacity in the anterior right upper lobe segment seen on axial image 16 series 4. SOFT TISSUES: No acute abnormality. 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 No high grade spinal canal stenosis. No suspicious bone lesion. CTA HEAD: ANTERIOR CIRCULATION: Atherosclerotic calcifications of the ICA siphons without significant stenosis or aneurysm. No significant stenosis of the anterior cerebral arteries. No significant stenosis of the middle cerebral arteries. POSTERIOR CIRCULATION: No significant stenosis of the posterior cerebral arteries. No significant stenosis of the basilar artery. No significant stenosis of the vertebral arteries. No aneurysm. OTHER: No dural venous sinus thrombosis on this non-dedicated study. IMPRESSION: 1. No acute large vessel occlusion. 2. Extensive predominantly calcified plaque along the proximal right cervical ICA with positive remodeling resulting in less than 50% stenosis. 3. These results were communicated to Dr. Lindzen at 3:18 PM on 08/15/2024 by secure text page via the [facility] messaging system. Electronically signed by: Ryan Chess MD 08/15/2024 03:21 PM EST RP Workstation: [id number]               LOS: 2 days   Syriah Delisi  [facility]   Pager on [email or web address]. If 7PM-7AM, please contact night-coverage at [email or web address]     08/17/2024, 3:02 PM

## 2024-08-17 NOTE — Evaluation (Signed)
 Occupational Therapy Evaluation Patient Details Name: Deanna [name] MRN: [id number] DOB: [birth year] Today's Date: 08/17/2024   History of Present Illness   Deanna [name] is a 73 y.o.  female with a past medical history of ESRD on dialysis, coronary artery disease, CVA, status post PCI, CHF with reduced ejection fraction, diabetes mellitus, and hypertension. Patient presents to the ED from her dialysis center with acute onset of slurred speech and facial droop there. She was also experiencing worsened weakness/numbness of the right upper extremity relative to her baseline.     Clinical Impressions Pt was seen for OT/PT co-evaluation this date. Prior to hospital admission, pt was mostly wheelchair bound, transferring self with MODI. Pt lives at ALF were she is eager to return, pt states she was able to dress herself prior to this CVA. Pt presents with deficits in decreased Ind in self care, balance, functional mobility/transfers,activity tolerance, and safety awareness affecting safe and optimal ADL completion. Pt requires MODA for bed mobility, MODA+2 for STS/step pivot into recliner, MAXA for LB dressing. Noted significant R sided weakness that is acute to this CVA, decreased light touch on her R side, and no change in vision per pt report. Pt would benefit from skilled OT services to address noted impairments and functional limitations (see below for any additional details) in order to maximize safety and independence while minimizing future risk of falls, injury, and readmission. OT will follow acutely.      If plan is discharge home, recommend the following:   A lot of help with walking and/or transfers;A lot of help with bathing/dressing/bathroom;Assist for transportation;Direct supervision/assist for financial management;Direct supervision/assist for medications management;Assistance with cooking/housework     Functional Status Assessment   Patient has had a recent decline in  their functional status and demonstrates the ability to make significant improvements in function in a reasonable and predictable amount of time.     Equipment Recommendations   Other (comment) (Defer to next venue of care)     Recommendations for Other Services         Precautions/Restrictions   Precautions Precautions: Fall Recall of Precautions/Restrictions: Impaired Restrictions Weight Bearing Restrictions Per Provider Order: No     Mobility Bed Mobility Overal bed mobility: Needs Assistance Bed Mobility: Supine to Sit     Supine to sit: Mod assist, Max assist     General bed mobility comments: modA to get EOB and MaxA to scoot to put feet on floor; vc and physical assist for lower extremity and placement of RUE but unable to grasp and control RUE    Transfers Overall transfer level: Needs assistance Equipment used: Rolling walker (2 wheels) Transfers: Sit to/from Stand, Bed to chair/wheelchair/BSC Sit to Stand: Mod assist, +2 physical assistance     Step pivot transfers: Mod assist, +2 physical assistance    Lateral/Scoot Transfers: Max assist General transfer comment: MAXA scoot to the EOB      Balance Overall balance assessment: Needs assistance Sitting-balance support: Feet supported Sitting balance-[name]: Fair     Standing balance support: Reliant on assistive device for balance, During functional activity Standing balance-[name]: Poor                             ADL either performed or assessed with clinical judgement   ADL Overall ADL's : Needs assistance/impaired Eating/Feeding: Set up;Sitting   Grooming: Set up;Sitting;Wash/dry face;Wash/dry hands  Lower Body Dressing: Maximal assistance;Sitting/lateral leans Lower Body Dressing Details (indicate cue type and reason): Don/doff socks Toilet Transfer: Ambulation;Rolling walker (2 wheels);BSC/3in1;Moderate assistance;+2 for safety/equipment;Cueing  for safety;Cueing for sequencing Toilet Transfer Details (indicate cue type and reason): Simulated step pivot into recliner Toileting- Clothing Manipulation and Hygiene: Maximal assistance;Sit to/from stand       Functional mobility during ADLs: Maximal assistance;+2 for safety/equipment       Vision Baseline Vision/History: 1 Wears glasses Patient Visual Report: No change from baseline       Perception         Praxis         Pertinent Vitals/Pain Pain Assessment Pain Assessment: No/denies pain     Extremity/Trunk Assessment Upper Extremity Assessment Upper Extremity Assessment: RUE deficits/detail (90degrees shoulder flexion, minimal wrist and zero finger AROM) RUE Sensation: decreased light touch RUE Coordination: decreased fine motor;decreased gross motor   Lower Extremity Assessment Lower Extremity Assessment: Defer to PT evaluation RLE Deficits / Details: limited RLE control/strength from new CVA RLE Sensation: decreased proprioception RLE Coordination: decreased gross motor   Cervical / Trunk Assessment Cervical / Trunk Assessment: Kyphotic   Communication Communication Communication: No apparent difficulties   Cognition Arousal: Alert Behavior During Therapy: WFL for tasks assessed/performed Cognition: No family/caregiver present to determine baseline                               Following commands: Intact       Cueing  General Comments   Cueing Techniques: Verbal cues  VSS   Exercises Exercises: Other exercises Other Exercises Other Exercises: Edu: Role of OT eval, safe ADL completion, benefits of rehab   Shoulder Instructions      Home Living Family/patient expects to be discharged to:: Assisted living                             Home Equipment: Rolling Walker (2 wheels);Wheelchair - power;Wheelchair - manual   Additional Comments: Long time resident of ALF, eager to return      Prior  Functioning/Environment Prior Level of Function : Independent/Modified Independent             Mobility Comments: amb short distances with RW per pt. report; but mostly using wheelchair per son on phone ADLs Comments: Staff at facility assist her as needed with bathing and safe transfers    OT Problem List: Decreased strength;Decreased range of motion;Decreased activity tolerance;Impaired balance (sitting and/or standing);Decreased coordination;Decreased safety awareness;Decreased knowledge of use of DME or AE;Decreased knowledge of precautions   OT Treatment/Interventions: Self-care/ADL training;Neuromuscular education;Energy conservation;DME and/or AE instruction;Therapeutic activities;Patient/family education;Balance training      OT Goals(Current goals can be found in the care plan section)   Acute Rehab OT Goals Patient Stated Goal: Go home soon OT Goal Formulation: With patient Time For Goal Achievement: 08/31/24 Potential to Achieve Goals: Good ADL Goals Pt Will Perform Grooming: sitting;with set-up Pt Will Perform Lower Body Dressing: sitting/lateral leans;with min assist Pt Will Transfer to Toilet: ambulating;with mod assist Pt Will Perform Toileting - Clothing Manipulation and hygiene: sit to/from stand;with mod assist   OT Frequency:  Min 3X/week    Co-evaluation PT/OT/SLP Co-Evaluation/Treatment: Yes Reason for Co-Treatment: Complexity of the patient's impairments (multi-system involvement);For patient/therapist safety PT goals addressed during session: Mobility/safety with mobility;Strengthening/ROM OT goals addressed during session: ADL's and self-care;Proper use of Adaptive equipment and DME  AM-PAC OT 6 Clicks Daily Activity     Outcome Measure Help from another person eating meals?: A Little Help from another person taking care of personal grooming?: A Lot Help from another person toileting, which includes using toliet, bedpan, or urinal?: A  Lot Help from another person bathing (including washing, rinsing, drying)?: A Lot Help from another person to put on and taking off regular upper body clothing?: A Little Help from another person to put on and taking off regular lower body clothing?: A Lot 6 Click Score: 14   End of Session Equipment Utilized During Treatment: Rolling walker (2 wheels);Gait belt Nurse Communication: Mobility status  Activity Tolerance: Patient tolerated treatment well Patient left: in chair;with call bell/phone within reach;with bed alarm set  OT Visit Diagnosis: Unsteadiness on feet (R26.81);Other abnormalities of gait and mobility (R26.89);Repeated falls (R29.6);Muscle weakness (generalized) (M62.81);Hemiplegia and hemiparesis Hemiplegia - Right/Left: Right Hemiplegia - caused by: Cerebral infarction                Time: 1427-1501 OT Time Calculation (min): 34 min Charges:  OT General Charges $OT Visit: 1 Visit OT Evaluation $OT Eval Moderate Complexity: 1 Mod  Pearl Bents M.S. OTR/L  08/17/24, 5:12 PM

## 2024-08-18 DIAGNOSIS — I639 Cerebral infarction, unspecified: Secondary | ICD-10-CM | POA: Diagnosis not present

## 2024-08-18 LAB — GLUCOSE, CAPILLARY
Glucose-Capillary: 103 mg/dL — ABNORMAL HIGH (ref 70–99)
Glucose-Capillary: 126 mg/dL — ABNORMAL HIGH (ref 70–99)
Glucose-Capillary: 139 mg/dL — ABNORMAL HIGH (ref 70–99)
Glucose-Capillary: 145 mg/dL — ABNORMAL HIGH (ref 70–99)
Glucose-Capillary: 153 mg/dL — ABNORMAL HIGH (ref 70–99)
Glucose-Capillary: 70 mg/dL (ref 70–99)

## 2024-08-18 LAB — HEPATITIS B SURFACE ANTIBODY, QUANTITATIVE: Hep B S AB Quant (Post): 15.8 m[IU]/mL

## 2024-08-18 MED ORDER — ONDANSETRON HCL 4 MG/2ML IJ SOLN: 4.0000 mg | Freq: Four times a day (QID) | INTRAMUSCULAR | Status: DC

## 2024-08-18 MED ORDER — ONDANSETRON HCL 4 MG/2ML IJ SOLN
4.0000 mg | Freq: Four times a day (QID) | INTRAMUSCULAR | Status: DC | PRN
  Administered 2024-08-18 – 2024-08-27 (×4): 4 mg via INTRAVENOUS
  Filled 2024-08-18 (×4): qty 2

## 2024-08-18 NOTE — Plan of Care (Signed)
  Problem: Health Behavior/Discharge Planning: Goal: Ability to manage health-related needs will improve Outcome: Progressing   Problem: Nutrition: Goal: Risk of aspiration will decrease 08/18/2024 1608 by Tanda Lum BRAVO, LPN Outcome: Progressing 08/18/2024 1303 by Tanda Lum BRAVO, LPN Outcome: Progressing   Problem: Education: Goal: Knowledge of General Education information will improve Description: Including pain rating scale, medication(s)/side effects and non-pharmacologic comfort measures 08/18/2024 1608 by Tanda Lum BRAVO, LPN Outcome: Progressing 08/18/2024 1303 by Tanda Lum BRAVO, LPN Outcome: Progressing   Problem: Health Behavior/Discharge Planning: Goal: Ability to manage health-related needs will improve Outcome: Progressing   Problem: Activity: Goal: Risk for activity intolerance will decrease Outcome: Progressing   Problem: Nutrition: Goal: Adequate nutrition will be maintained Outcome: Progressing   Problem: Coping: Goal: Level of anxiety will decrease Outcome: Progressing   Problem: Pain Managment: Goal: General experience of comfort will improve and/or be controlled 08/18/2024 1608 by Tanda Lum BRAVO, LPN Outcome: Progressing 08/18/2024 1303 by Tanda Lum BRAVO, LPN Outcome: Progressing   Problem: Safety: Goal: Ability to remain free from injury will improve 08/18/2024 1608 by Tanda Lum BRAVO, LPN Outcome: Progressing 08/18/2024 1303 by Tanda Lum BRAVO, LPN Outcome: Progressing   Problem: Skin Integrity: Goal: Risk for impaired skin integrity will decrease Outcome: Progressing

## 2024-08-18 NOTE — NC FL2 (Signed)
 [place]  MEDICAID FL2 LEVEL OF CARE FORM     IDENTIFICATION  Patient Name: Deanna [name] Birthdate: 25-Dec-1950 Sex: female Admission Date (Current Location): 08/15/2024  [facility] and [place] Number:  Chiropodist and Address:  [facility], [address], [place], [place] [address]      Provider Number: [id number]  Attending Physician Name and Address:  Jens Durand, MD  Relative Name and Phone Number:  Major Merle [phone]    Current Level of Care: Hospital Recommended Level of Care: Skilled Nursing Facility Prior Approval Number:    Date Approved/Denied:   PASRR Number: [id number]  Discharge Plan: SNF    Current Diagnoses: Patient Active Problem List   Diagnosis Date Noted   Stroke (cerebrum) (HCC) 08/15/2024   Left hip pain 05/20/2024   Primary osteoarthritis of left hip 05/20/2024   Chronic left SI joint pain 05/20/2024   SI joint arthritis 05/20/2024   Ulnar neuropathy at elbow of right upper extremity 09/05/2023   PAD (peripheral artery disease) 08/04/2023   Type II diabetes mellitus with renal manifestations (HCC) 05/18/2023   Fall 05/17/2023   Anemia in ESRD (end-stage renal disease) (HCC) 05/17/2023   CAD (coronary artery disease) 05/17/2023   Leukocytosis 05/17/2023   Closed nondisplaced fracture of third cervical vertebra (HCC) 05/01/2023   Shortness of breath 01/21/2022   Severe sepsis (HCC) 01/20/2022   SOB (shortness of breath) 01/20/2022   Chest pain 01/20/2022   Hypoglycemia associated with diabetes (HCC) 01/20/2022   End-stage renal disease on hemodialysis (HCC) 01/20/2022   Aspiration pneumonia (HCC) 01/20/2022   Acute respiratory failure with hypoxia and hypercapnia (HCC) 01/19/2022   Angioedema 01/14/2022   Elevated troponin 01/08/2022   Hyperkalemia 01/08/2022   Acute right flank pain    Weakness of right lower extremity    Right sided numbness    CVA (cerebral vascular accident) (HCC)  12/28/2021   Hypokalemia 12/27/2021   Acute kidney injury superimposed on chronic kidney disease 12/27/2021   Anemia 12/27/2021   Acute CVA (cerebrovascular accident) (HCC) 12/27/2021   Lobar pneumonia 12/27/2021   Right sided weakness 12/26/2021   HFrEF (heart failure with reduced ejection fraction) (HCC) 10/31/2021   Angina of effort 03/01/2016   Essential (primary) hypertension 06/24/2014   Type 2 diabetes mellitus without complications (HCC) 06/24/2014    Orientation RESPIRATION BLADDER Height & Weight     Self, Time  Normal Continent Weight: 71.4 kg Height:  5' 1 (154.9 cm)  BEHAVIORAL SYMPTOMS/MOOD NEUROLOGICAL BOWEL NUTRITION STATUS      Continent Diet (Heart)  AMBULATORY STATUS COMMUNICATION OF NEEDS Skin   Extensive Assist Verbally Skin abrasions (left facial abrasion, LEFT forearm fistula)                       Personal Care Assistance Level of Assistance  Bathing, Dressing Bathing Assistance: Maximum assistance   Dressing Assistance: Maximum assistance     Functional Limitations Info             SPECIAL CARE FACTORS FREQUENCY                       Contractures Contractures Info: Present ([place] contractures digits 2-5)    Additional Factors Info  Code Status, Allergies Code Status Info: Full Allergies Info: Ace Inhibitors, Entresto  (Sacubitril -valsartan ), Lisinopril, Peanut-containing Drug Products, Penicillins, Tomato           Current Medications (08/18/2024):  This is the current hospital active medication list  Current Facility-Administered Medications  Medication Dose Route Frequency Provider Last Rate Last Admin   acetaminophen  (TYLENOL ) tablet 650 mg  650 mg Oral Q4H PRN Lindzen, Eric, MD   650 mg at 08/16/24 0755   Or   acetaminophen  (TYLENOL ) 160 MG/5ML solution 650 mg  650 mg Per Tube Q4H PRN Merrianne Locus, MD       Or   acetaminophen  (TYLENOL ) suppository 650 mg  650 mg Rectal Q4H PRN Merrianne Locus, MD       aspirin  EC  tablet 81 mg  81 mg Oral Daily Lindzen, Eric, MD   81 mg at 08/18/24 9173   atorvastatin  (LIPITOR) tablet 40 mg  40 mg Oral Daily Assaker, Darrin, MD   40 mg at 08/18/24 0826   calcium  acetate (PHOSLO ) capsule 667 mg  667 mg Oral TID WC Levorn Ramonita SQUIBB, NP   667 mg at 08/18/24 1133   Chlorhexidine  Gluconate Cloth 2 % PADS 6 each  6 each Topical Q0600 Levorn Ramonita SQUIBB, NP       clopidogrel  (PLAVIX ) tablet 75 mg  75 mg Oral Daily Lindzen, Eric, MD   75 mg at 08/18/24 9173   insulin  aspart (novoLOG ) injection 0-6 Units  0-6 Units Subcutaneous Q4H Keene, Jeremiah D, NP       ondansetron  (ZOFRAN ) injection 4 mg  4 mg Intravenous Q6H PRN Duncan, Hazel V, MD   4 mg at 08/18/24 0441   pantoprazole  (PROTONIX ) EC tablet 40 mg  40 mg Oral QHS Chappell, Alex B, RPH       senna-docusate (Senokot-S) tablet 1 tablet  1 tablet Oral QHS PRN Lindzen, Eric, MD       sertraline  (ZOLOFT ) tablet 100 mg  100 mg Oral Daily Assaker, Darrin, MD   100 mg at 08/18/24 9173   sodium chloride  flush (NS) 0.9 % injection 3 mL  3 mL Intravenous Once Bradler, Evan K, MD         Discharge Medications: Please see discharge summary for a list of discharge medications.  Relevant Imaging Results:  Relevant Lab Results:   Additional Information SS# [id number], Receives outpatient dialysis treatments at [facility] on a MWF schedule, supervised by Dr. Marcelino.  Daved JONETTA Hamilton, RN

## 2024-08-18 NOTE — Progress Notes (Signed)
 Occupational Therapy Treatment Patient Details Name: Deanna [name] MRN: [id number] DOB: [birth year] Today's Date: 08/18/2024   History of present illness Deanna [name] is a 73 y.o.  female with a past medical history of ESRD on dialysis, coronary artery disease, CVA, status post PCI, CHF with reduced ejection fraction, diabetes mellitus, and hypertension. Patient presents to the ED from her dialysis center with acute onset of slurred speech and facial droop there. She was also experiencing worsened weakness/numbness of the right upper extremity relative to her baseline.   OT comments  Upon entering the room,pt seated on EOB. She is upset and wanting to leave hospital today because, Dr. Marcelino said I could go. OT discussing OT purpose and plan for session. Pt states she always stands to transfer into wheelchair. OT shows STEDY to pt and she agrees to trail it this session to see if she can get into standing for some weight bearing. OT setting up equipment and phone rings. Pt answers phone and it appears to be family member discussing rehab with pt and she gets upset and hangs up. She then looks at therapist and states,  I am not standing. I ain't doing shit. Pt refusing to continue and session terminated. Bed alarm activated and RN notified. Call bell within reach.      If plan is discharge home, recommend the following:  A lot of help with walking and/or transfers;A lot of help with bathing/dressing/bathroom;Assist for transportation;Direct supervision/assist for financial management;Direct supervision/assist for medications management;Assistance with cooking/housework   Equipment Recommendations  Other (comment) (defer to next venue of care)       Precautions / Restrictions Precautions Precautions: Fall       Mobility Bed Mobility               General bed mobility comments: seated on EOB    Transfers                   General transfer comment: pt refused          ADL either performed or assessed with clinical judgement     Communication Communication Communication: No apparent difficulties   Cognition Arousal: Alert Behavior During Therapy: WFL for tasks assessed/performed Cognition: No family/caregiver present to determine baseline                               Following commands: Intact        Cueing   Cueing Techniques: Verbal cues             Pertinent Vitals/ Pain       Pain Assessment Pain Assessment: No/denies pain         Frequency  Min 2X/week        Progress Toward Goals  OT Goals(current goals can now be found in the care plan section)  Progress towards OT goals: Not progressing toward goals - comment      AM-PAC OT 6 Clicks Daily Activity     Outcome Measure   Help from another person eating meals?: A Little Help from another person taking care of personal grooming?: A Lot Help from another person toileting, which includes using toliet, bedpan, or urinal?: A Lot Help from another person bathing (including washing, rinsing, drying)?: A Lot Help from another person to put on and taking off regular upper body clothing?: A Little Help from another person to put on and taking off regular lower body clothing?:  A Lot 6 Click Score: 14    End of Session    OT Visit Diagnosis: Unsteadiness on feet (R26.81);Other abnormalities of gait and mobility (R26.89);Repeated falls (R29.6);Muscle weakness (generalized) (M62.81);Hemiplegia and hemiparesis Hemiplegia - Right/Left: Right Hemiplegia - caused by: Cerebral infarction   Activity Tolerance     Patient Left in bed;with call bell/phone within reach;with bed alarm set   Nurse Communication Mobility status        Time: 8966-8953 OT Time Calculation (min): 13 min  Charges: OT General Charges $OT Visit: 1 Visit OT Treatments $Therapeutic Activity: 8-22 mins  Izetta Claude, MS, OTR/L , CBIS ascom [phone]  08/18/24, 12:00  PM

## 2024-08-18 NOTE — TOC Initial Note (Addendum)
 Transition of Care [facility]) - Initial/Assessment Note    Patient Details  Name: Deanna [name] MRN: [id number] Date of Birth: March 19, 1951  Transition of [facility]) CM/SW Contact:    Daved JONETTA Hamilton, RN Phone Number: 08/18/2024, 2:31 PM  Clinical Narrative:                  This CM made aware patient initially refusing SNF-STR. This CM spoke with Tammy at [facility] who advised this CM that they have spoken with patient and advised she will need to complete STR before returning there.   Met with patient, introduced self and explained role. Discussed with patient therapy recommendations for SNF-STR and information from Tammy at [facility]. Patient verbalized she will go to [facility] for STR, whom she has already spoken with, and then once she gets back to [facility], she will be looking for somewhere else to live. Advised patient I will send referral to [facility] and get the process started and will follow up with her. Patient verbalized understanding and agreement to this plan.  Referral info sent to [facility] in [place]. Spoke to [place] with [facility] to ensure they are aware patient receives outpatient dialysis at [facility] on MWF schedule. [name] will attempt to expedite patient approval for admission.   2:26pm- Requested Nitchia with TOC to begin prior auth for [facility].   3:21pm- Received from Nitchia with TOC:  Pending AuthID:[id number]   3:48pm- Received from Nitchia with TOC:  Approved [id number] Dates:11/24-11/26/2025 Next review Date: 08/20/2024  [name] with [facility] notified of approval.       Expected Discharge Plan: Skilled Nursing Facility Barriers to Discharge: Insurance Authorization   Patient Goals and CMS Choice     Choice offered to / list presented to : Patient      Expected Discharge Plan and Services   Discharge Planning Services: CM Consult Post Acute Care Choice: Skilled Nursing Facility Living arrangements for the past 2 months: Assisted Living  Facility Expected Discharge Date: 08/18/24                                    Prior Living Arrangements/Services Living arrangements for the past 2 months: Assisted Living Facility Lives with:: Self Patient language and need for interpreter reviewed:: Yes Do you feel safe going back to the place where you live?: Yes      Need for Family Participation in Patient Care: No (Comment) Care giver support system in place?: Yes (comment)   Criminal Activity/Legal Involvement Pertinent to Current Situation/Hospitalization: No - Comment as needed  Activities of Daily Living   ADL Screening (condition at time of admission) Independently performs ADLs?: No Does the patient have a NEW difficulty with bathing/dressing/toileting/self-feeding that is expected to last >3 days?: No Does the patient have a NEW difficulty with getting in/out of bed, walking, or climbing stairs that is expected to last >3 days?: No Does the patient have a NEW difficulty with communication that is expected to last >3 days?: No Is the patient deaf or have difficulty hearing?: No Does the patient have difficulty seeing, even when wearing glasses/contacts?: No Does the patient have difficulty concentrating, remembering, or making decisions?: No  Permission Sought/Granted Permission sought to share information with : Case Manager, Magazine Features Editor Permission granted to share information with : Yes, Verbal Permission Granted     Permission granted to share info w AGENCY: [facility] and [facility]  Emotional Assessment Appearance:: Appears stated age Attitude/Demeanor/Rapport: Complaining, Guarded Affect (typically observed): Guarded, Frustrated Orientation: : Oriented to Place, Oriented to  Time, Oriented to Situation, Oriented to Self Alcohol / Substance Use: Not Applicable Psych Involvement: No (comment)  Admission diagnosis:  Stroke (cerebrum) (HCC) [I63.9] Patient Active Problem List    Diagnosis Date Noted   Stroke (cerebrum) (HCC) 08/15/2024   Left hip pain 05/20/2024   Primary osteoarthritis of left hip 05/20/2024   Chronic left SI joint pain 05/20/2024   SI joint arthritis 05/20/2024   Ulnar neuropathy at elbow of right upper extremity 09/05/2023   PAD (peripheral artery disease) 08/04/2023   Type II diabetes mellitus with renal manifestations (HCC) 05/18/2023   Fall 05/17/2023   Anemia in ESRD (end-stage renal disease) (HCC) 05/17/2023   CAD (coronary artery disease) 05/17/2023   Leukocytosis 05/17/2023   Closed nondisplaced fracture of third cervical vertebra (HCC) 05/01/2023   Shortness of breath 01/21/2022   Severe sepsis (HCC) 01/20/2022   SOB (shortness of breath) 01/20/2022   Chest pain 01/20/2022   Hypoglycemia associated with diabetes (HCC) 01/20/2022   End-stage renal disease on hemodialysis (HCC) 01/20/2022   Aspiration pneumonia (HCC) 01/20/2022   Acute respiratory failure with hypoxia and hypercapnia (HCC) 01/19/2022   Angioedema 01/14/2022   Elevated troponin 01/08/2022   Hyperkalemia 01/08/2022   Acute right flank pain    Weakness of right lower extremity    Right sided numbness    CVA (cerebral vascular accident) (HCC) 12/28/2021   Hypokalemia 12/27/2021   Acute kidney injury superimposed on chronic kidney disease 12/27/2021   Anemia 12/27/2021   Acute CVA (cerebrovascular accident) (HCC) 12/27/2021   Lobar pneumonia 12/27/2021   Right sided weakness 12/26/2021   HFrEF (heart failure with reduced ejection fraction) (HCC) 10/31/2021   Angina of effort 03/01/2016   Essential (primary) hypertension 06/24/2014   Type 2 diabetes mellitus without complications (HCC) 06/24/2014   PCP:  Brutus Delon SAUNDERS, NP Pharmacy:   [facility] - [place], [place] - [address] [facility] [address] [place] [address] Phone: [phone] Fax: [phone]     Social Drivers of Health (SDOH) Social History: SDOH  Screenings   Food Insecurity: Patient Declined (08/17/2024)  Housing: Unknown (08/17/2024)  Transportation Needs: Patient Declined (08/17/2024)  Utilities: Patient Declined (08/17/2024)  Depression (PHQ2-9): Low Risk  (06/12/2024)  Financial Resource Strain: Low Risk  (07/22/2024)   Received from [facility]  Social Connections: Patient Declined (08/17/2024)  Tobacco Use: Low Risk  (08/15/2024)   SDOH Interventions:     Readmission Risk Interventions    01/16/2022    3:45 PM 12/29/2021    2:33 PM  Readmission Risk Prevention Plan  Transportation Screening Complete Complete  PCP or Specialist Appt within 5-7 Days  Complete  Home Care Screening  Complete  Medication Review (RN CM)  Complete  Medication Review (RN Care Manager) Complete   PCP or Specialist appointment within 3-5 days of discharge Complete   HRI or Home Care Consult Complete   SW Recovery Care/Counseling Consult Complete   Palliative Care Screening Not Applicable   Skilled Nursing Facility Not Applicable

## 2024-08-18 NOTE — Progress Notes (Signed)
 Progress Note    Doniqua Saxby  FMW:[id number] DOB: 12/12/1950  DOA: 08/15/2024 PCP: Brutus Delon SAUNDERS, NP      Brief Narrative:    Medical records reviewed and are as summarized below:  Margret Boster is a 73 y.o. female with medical history significant for CVA, ESRD on HD, extensive PAD s/p angioplasty and stent, history of HFrEF with recovered EF, hypertension, hyperlipidemia, type 2 diabetes, CAD, DISH arthritis, C3 anterior osteophyte fracture, was brought from the outpatient dialysis center to the ED because of right facial droop and slurred speech.  She also complained of numbness and weakness in the right upper extremity.  ED Course: Initial Vital Signs: pulse 92, RR 16, BP 164/79, SpO2 98% on room air Significant Labs: Creatinine 2.7, Alkaline phosphatase 174, hemoglobin 11.2, hematocrit 35.2 Ethyl alcohol <15 Imaging CT Head>>IMPRESSION: 1. Likely acute or subacute nonhemorrhagic infarct in the anterior left temporal lobe measuring up to 3.8 cm. 2. Moderate confluent periventricular and subcortical white matter hypoattenuation, stable. 3. ASPECTS: 8. CT Angio Head & Neck>>IMPRESSION: 1. No acute large vessel occlusion. 2. Extensive predominantly calcified plaque along the proximal right cervical ICA with positive remodeling resulting in less than 50% stenosis.   She was by the neurologist for acute stroke.  She was given TNK at 3:15 PM on 08/15/2024.  She was admitted to the ICU for further management.     Assessment/Plan:   Principal Problem:   Stroke (cerebrum) (HCC)   Body mass index is 29.74 kg/m.   Acute left frontal lobe nonhemorrhagic stroke: S/p tenecteplase  on 08/15/2024 at 3:15 PM. Follow-up MRI brain showed acute nonhemorrhagic infarct in the posterior left frontal lobe. Continue Lipitor, aspirin  Plavix  Limited 2D echo view because patient was not cooperative. LDL 105, hemoglobin A1c 5.7 PT OT recommended discharge to SNF.   Follow-up with TOC to assist with disposition.    ESRD on HD: She had hemodialysis today.  Follow-up with nephrologist.   Type II DM: NovoLog  as needed for hyperglycemia. Hemoglobin A1c 5.7.   Comorbidities include hyperlipidemia, hypertension, PAD s/p stent, CAD, prior history of strokes, history of C3 anterior osteophyte fracture.  She is medically stable for discharge.  Awaiting placement to SNF.  Follow-up with TOC for disposition.    Diet Order             Diet renal with fluid restriction           Diet Heart Room service appropriate? Yes; Fluid consistency: Thin  Diet effective now                                  Consultants: Intensivist Neurologist Nephrologist  Procedures: None    Medications:    aspirin  EC  81 mg Oral Daily   atorvastatin   40 mg Oral Daily   calcium  acetate  667 mg Oral TID WC   Chlorhexidine  Gluconate Cloth  6 each Topical Q0600   clopidogrel   75 mg Oral Daily   insulin  aspart  0-6 Units Subcutaneous Q4H   pantoprazole   40 mg Oral QHS   sertraline   100 mg Oral Daily   sodium chloride  flush  3 mL Intravenous Once   Continuous Infusions:   Anti-infectives (From admission, onward)    None              Family Communication/Anticipated D/C date and plan/Code Status   DVT prophylaxis: SCD's Start: 08/15/24 1522  Code Status: Full Code  Family Communication: None Disposition Plan: Plan to discharge to SNF   Status is: Inpatient Remains inpatient appropriate because: Acute stroke       Subjective:   Interval events noted.  She has no complaints.  She says she wants to be discharged.  Objective:    Vitals:   08/18/24 0045 08/18/24 0445 08/18/24 0845 08/18/24 1237  BP: 132/60 125/66 (!) 107/51 115/63  Pulse: 91 99 94 92  Resp: 16 18 16 18   Temp: 98.5 F (36.9 C) 97.9 F (36.6 C) 97.8 F (36.6 C) 97.9 F (36.6 C)  TempSrc: Axillary Axillary    SpO2: 100% 100%  100%  Weight:       Height:       No data found.  No intake or output data in the 24 hours ending 08/18/24 1314  Filed Weights   08/15/24 1701 08/17/24 0755 08/17/24 1141  Weight: 71.3 kg 74.4 kg 71.4 kg    Exam:   GEN: NAD SKIN: Warm and dry EYES: No pallor oricterus ENT: MMM CV: RRR PULM: CTA B ABD: soft, ND, NT, +BS CNS: AAO x 3, right upper extremity weakness (power 2/5) EXT: No edema or tenderness      Data Reviewed:   I have personally reviewed following labs and imaging studies:  Labs: Labs show the following:   Basic Metabolic Panel: Recent Labs  Lab 08/15/24 1427 08/16/24 0548 08/17/24 0803  NA 139 137 135  K 4.3 3.9 4.2  CL 96* 99 98  CO2 29 26 24   GLUCOSE 98 78 85  BUN 12 18 29*  CREATININE 2.72* 3.65* 5.15*  CALCIUM  8.8* 8.2* 8.2*  PHOS  --  4.4 5.3*   GFR Estimated Creatinine Clearance: 8.9 mL/min (A) (by C-G formula based on SCr of 5.15 mg/dL (H)). Liver Function Tests: Recent Labs  Lab 08/15/24 1427 08/16/24 0548 08/17/24 0803  AST 24  --   --   ALT 17  --   --   ALKPHOS 174*  --   --   BILITOT 0.4  --   --   PROT 8.5*  --   --   ALBUMIN  4.3 3.8 3.7   No results for input(s): LIPASE, AMYLASE in the last 168 hours. No results for input(s): AMMONIA in the last 168 hours. Coagulation profile Recent Labs  Lab 08/15/24 1427  INR 1.0    CBC: Recent Labs  Lab 08/15/24 1427 08/16/24 0548 08/17/24 0802  WBC 7.9 7.7 7.7  NEUTROABS 5.5  --   --   HGB 11.2* 9.9* 10.0*  HCT 35.2* 31.2* 31.4*  MCV 86.3 86.9 86.0  PLT 186 189 191   Cardiac Enzymes: No results for input(s): CKTOTAL, CKMB, CKMBINDEX, TROPONINI in the last 168 hours. BNP (last 3 results) No results for input(s): PROBNP in the last 8760 hours. CBG: Recent Labs  Lab 08/17/24 2016 08/18/24 0044 08/18/24 0538 08/18/24 0838 08/18/24 1242  GLUCAP 83 70 103* 126* 139*   D-Dimer: No results for input(s): DDIMER in the last 72 hours. Hgb A1c: Recent Labs     08/15/24 1427  HGBA1C 5.7*   Lipid Profile: Recent Labs    08/16/24 0548  CHOL 194  HDL 70  LDLCALC 105*  TRIG 95  CHOLHDL 2.8   Thyroid function studies: No results for input(s): TSH, T4TOTAL, T3FREE, THYROIDAB in the last 72 hours.  Invalid input(s): FREET3 Anemia work up: No results for input(s): VITAMINB12, FOLATE, FERRITIN, TIBC, IRON, RETICCTPCT in  the last 72 hours. Sepsis Labs: Recent Labs  Lab 08/15/24 1427 08/16/24 0548 08/17/24 0802  WBC 7.9 7.7 7.7    Microbiology No results found for this or any previous visit (from the past 240 hours).  Procedures and diagnostic studies:  MR BRAIN WO CONTRAST Result Date: 08/16/2024 EXAM: MRI BRAIN WITHOUT CONTRAST 08/16/2024 03:58:00 PM TECHNIQUE: Multiplanar multisequence MRI of the head/brain was performed without the administration of intravenous contrast. COMPARISON: CT head without contrast 08/15/2024 and MR head without contrast 10/11/2023. CLINICAL HISTORY: Stroke, follow up. Acute onset of slurred speech and facial droop yesterday at dialysis. FINDINGS: BRAIN AND VENTRICLES: An acute nonhemorrhagic infarct in the posterior left frontal lobe measures 2 mm. This involves the primary motor cortex. T2 and FLAIR hyperintensity is associated with the acute/subacute infarct. A remote infarct is present in the anterior right insular cortex and right frontal lobe operculum, new since the prior study. A remote lateral left temporal lobe infarct is new since the prior exam. Confluent periventricular and subcortical T2 hyperintensities are present bilaterally, increased from the prior study. No intracranial hemorrhage. No mass. No midline shift. No hydrocephalus. The sella is unremarkable. Normal flow voids. ORBITS: No acute abnormality. SINUSES AND MASTOIDS: No acute abnormality. BONES AND SOFT TISSUES: Normal marrow signal. No acute soft tissue abnormality. IMPRESSION: 1. Acute nonhemorrhagic infarct in the  posterior left frontal lobe involving the primary motor cortex, with associated T2/FLAIR hyperintensity. 2. Confluent periventricular and subcortical T2 hyperintensities bilaterally, increased from the prior study. 3. Remote infarcts in the anterior right insular cortex, right frontal lobe operculum, and lateral left temporal lobe are new since the prior exam. Electronically signed by: Lonni Necessary MD 08/16/2024 04:27 PM EST RP Workstation: [id number]               LOS: 3 days   Katrina Daddona  [facility]   Pager on [email or web address]. If 7PM-7AM, please contact night-coverage at [email or web address]     08/18/2024, 1:14 PM

## 2024-08-18 NOTE — Progress Notes (Signed)
 [facility]  ROUNDING NOTE   Subjective:   Patient is known to our practice and receives outpatient dialysis treatments at [facility] on a MWF schedule, supervised by Dr. Marcelino.  Update: Patient seen and evaluated on dialysis. Verbal consent with son over the phone since patient cannot move right arm to sign. No issues with dialysis. Patient agitated with loss of function to right hand.  Patient seen sitting at side of bed Alert and oriented States she's ready to discharge.  Continues to have right sided weakness   Objective:  Vital signs in last 24 hours:  Temp:  [97.6 F (36.4 C)-98.5 F (36.9 C)] 97.8 F (36.6 C) (11/24 0845) Pulse Rate:  [91-101] 94 (11/24 0845) Resp:  [16-18] 16 (11/24 0845) BP: (107-135)/(51-80) 107/51 (11/24 0845) SpO2:  [100 %] 100 % (11/24 0445)  Weight change:  Filed Weights   08/15/24 1701 08/17/24 0755 08/17/24 1141  Weight: 71.3 kg 74.4 kg 71.4 kg    Intake/Output: I/O last 3 completed shifts: In: 0  Out: 1600 [Other:1600]   Intake/Output this shift:  No intake/output data recorded.  Physical Exam: General: NAD  Head: Normocephalic  Lungs:  Clear, room air  Heart: Regular   Abdomen:  Soft,   Extremities:  Trace peripheral edema.  Neurologic: Alert, awake  Skin: dry  Access: Rt chest Permcath    Basic Metabolic Panel: Recent Labs  Lab 08/15/24 1427 08/16/24 0548 08/17/24 0803  NA 139 137 135  K 4.3 3.9 4.2  CL 96* 99 98  CO2 29 26 24   GLUCOSE 98 78 85  BUN 12 18 29*  CREATININE 2.72* 3.65* 5.15*  CALCIUM  8.8* 8.2* 8.2*  PHOS  --  4.4 5.3*    Liver Function Tests: Recent Labs  Lab 08/15/24 1427 08/16/24 0548 08/17/24 0803  AST 24  --   --   ALT 17  --   --   ALKPHOS 174*  --   --   BILITOT 0.4  --   --   PROT 8.5*  --   --   ALBUMIN  4.3 3.8 3.7   No results for input(s): LIPASE, AMYLASE in the last 168 hours. No results for input(s): AMMONIA in the last 168 hours.  CBC: Recent  Labs  Lab 08/15/24 1427 08/16/24 0548 08/17/24 0802  WBC 7.9 7.7 7.7  NEUTROABS 5.5  --   --   HGB 11.2* 9.9* 10.0*  HCT 35.2* 31.2* 31.4*  MCV 86.3 86.9 86.0  PLT 186 189 191    Cardiac Enzymes: No results for input(s): CKTOTAL, CKMB, CKMBINDEX, TROPONINI in the last 168 hours.  BNP: Invalid input(s): POCBNP  CBG: Recent Labs  Lab 08/17/24 1647 08/17/24 2016 08/18/24 0044 08/18/24 0538 08/18/24 0838  GLUCAP 97 83 70 103* 126*    Microbiology: Results for orders placed or performed during the hospital encounter of 01/20/22  Blood culture (routine x 2)     Status: None   Collection Time: 01/20/22  7:22 PM   Specimen: BLOOD  Result Value Ref Range Status   Specimen Description BLOOD BLOOD RIGHT HAND  Final   Special Requests   Final    BOTTLES DRAWN AEROBIC AND ANAEROBIC Blood Culture results may not be optimal due to an inadequate volume of blood received in culture bottles   Culture   Final    NO GROWTH 5 DAYS Performed at [facility], [address], [place], [place] [address]    Report Status 01/25/2022 FINAL  Final  Blood culture (routine x 2)     Status: None   Collection Time: 01/20/22  7:22 PM   Specimen: BLOOD  Result Value Ref Range Status   Specimen Description BLOOD BLOOD LEFT HAND  Final   Special Requests   Final    BOTTLES DRAWN AEROBIC AND ANAEROBIC Blood Culture results may not be optimal due to an inadequate volume of blood received in culture bottles   Culture   Final    NO GROWTH 5 DAYS Performed at [facility], [address] [place], [place] [address]    Report Status 01/25/2022 FINAL  Final  Resp Panel by RT-PCR (Flu A&B, Covid) Nasopharyngeal Swab     Status: None   Collection Time: 01/20/22  7:28 PM   Specimen: Nasopharyngeal Swab; Nasopharyngeal(NP) swabs in vial transport medium  Result Value Ref Range Status   SARS Coronavirus 2 by RT PCR NEGATIVE NEGATIVE Final    Comment: (NOTE) SARS-CoV-2  target nucleic acids are NOT DETECTED.  The SARS-CoV-2 RNA is generally detectable in upper respiratory specimens during the acute phase of infection. The lowest concentration of SARS-CoV-2 viral copies this assay can detect is 138 copies/mL. A negative result does not preclude SARS-Cov-2 infection and should not be used as the sole basis for treatment or other patient management decisions. A negative result may occur with  improper specimen collection/handling, submission of specimen other than nasopharyngeal swab, presence of viral mutation(s) within the areas targeted by this assay, and inadequate number of viral copies(<138 copies/mL). A negative result must be combined with clinical observations, patient history, and epidemiological information. The expected result is Negative.  Fact Sheet for Patients:  bloggercourse.com  Fact Sheet for Healthcare Providers:  seriousbroker.it  This test is no t yet approved or cleared by the [place]  FDA and  has been authorized for detection and/or diagnosis of SARS-CoV-2 by FDA under an Emergency Use Authorization (EUA). This EUA will remain  in effect (meaning this test can be used) for the duration of the COVID-19 declaration under Section 564(b)(1) of the Act, 21 U.S.C.section 360bbb-3(b)(1), unless the authorization is terminated  or revoked sooner.       Influenza A by PCR NEGATIVE NEGATIVE Final   Influenza B by PCR NEGATIVE NEGATIVE Final    Comment: (NOTE) The Xpert Xpress SARS-CoV-2/FLU/RSV plus assay is intended as an aid in the diagnosis of influenza from Nasopharyngeal swab specimens and should not be used as a sole basis for treatment. Nasal washings and aspirates are unacceptable for Xpert Xpress SARS-CoV-2/FLU/RSV testing.  Fact Sheet for Patients: bloggercourse.com  Fact Sheet for Healthcare  Providers: seriousbroker.it  This test is not yet approved or cleared by the [place]  FDA and has been authorized for detection and/or diagnosis of SARS-CoV-2 by FDA under an Emergency Use Authorization (EUA). This EUA will remain in effect (meaning this test can be used) for the duration of the COVID-19 declaration under Section 564(b)(1) of the Act, 21 U.S.C. section 360bbb-3(b)(1), unless the authorization is terminated or revoked.  Performed at [facility], [address] [place], [place] [address]     Coagulation Studies: Recent Labs    08/15/24 1427  LABPROT 13.8  INR 1.0    Urinalysis: No results for input(s): COLORURINE, LABSPEC, PHURINE, GLUCOSEU, HGBUR, BILIRUBINUR, KETONESUR, PROTEINUR, UROBILINOGEN, NITRITE, LEUKOCYTESUR in the last 72 hours.  Invalid input(s): APPERANCEUR    Imaging: MR BRAIN WO CONTRAST Result Date: 08/16/2024 EXAM: MRI BRAIN WITHOUT CONTRAST 08/16/2024 03:58:00 PM TECHNIQUE: Multiplanar multisequence  MRI of the head/brain was performed without the administration of intravenous contrast. COMPARISON: CT head without contrast 08/15/2024 and MR head without contrast 10/11/2023. CLINICAL HISTORY: Stroke, follow up. Acute onset of slurred speech and facial droop yesterday at dialysis. FINDINGS: BRAIN AND VENTRICLES: An acute nonhemorrhagic infarct in the posterior left frontal lobe measures 2 mm. This involves the primary motor cortex. T2 and FLAIR hyperintensity is associated with the acute/subacute infarct. A remote infarct is present in the anterior right insular cortex and right frontal lobe operculum, new since the prior study. A remote lateral left temporal lobe infarct is new since the prior exam. Confluent periventricular and subcortical T2 hyperintensities are present bilaterally, increased from the prior study. No intracranial hemorrhage. No mass. No midline shift. No hydrocephalus.  The sella is unremarkable. Normal flow voids. ORBITS: No acute abnormality. SINUSES AND MASTOIDS: No acute abnormality. BONES AND SOFT TISSUES: Normal marrow signal. No acute soft tissue abnormality. IMPRESSION: 1. Acute nonhemorrhagic infarct in the posterior left frontal lobe involving the primary motor cortex, with associated T2/FLAIR hyperintensity. 2. Confluent periventricular and subcortical T2 hyperintensities bilaterally, increased from the prior study. 3. Remote infarcts in the anterior right insular cortex, right frontal lobe operculum, and lateral left temporal lobe are new since the prior exam. Electronically signed by: Lonni Necessary MD 08/16/2024 04:27 PM EST RP Workstation: [id number]     Medications:     aspirin  EC  81 mg Oral Daily   atorvastatin   40 mg Oral Daily   calcium  acetate  667 mg Oral TID WC   Chlorhexidine  Gluconate Cloth  6 each Topical Q0600   clopidogrel   75 mg Oral Daily   insulin  aspart  0-6 Units Subcutaneous Q4H   pantoprazole   40 mg Oral QHS   sertraline   100 mg Oral Daily   sodium chloride  flush  3 mL Intravenous Once   acetaminophen  **OR** acetaminophen  (TYLENOL ) oral liquid 160 mg/5 mL **OR** acetaminophen , ondansetron  (ZOFRAN ) IV, senna-docusate  Assessment/ Plan:  Ms. Tanaka Deanna [name] is a 73 y.o.  female with a past medical history of ESRD on dialysis, CVA, coronary artery disease, status post PCI, CHF with reduced ejection fraction, diabetes mellitus, and hypertension, Patient presents to the ED from her dialysis center with acute onset of slurred speech and facial droop there. She was also experiencing worsened weakness/numbness of the right upper extremity relative to her baseline. Code Stroke was called.    [facility]/MWF/Rt PC/74.0 kg   Acute versus subacute ischemic CVA anterior left temporal lobe status post tenecteplase  administration.  History of recurrent CVA. Loop recorder placed on 08/08/23. Prescribed aspirin  and Plavix   outpatient. CT head-likely acute or subacute nonhemorrhagic infarct in the anterior left temporal lobe measuring up to moderate confluent subcortical white matter hypoattenuation stable.  MRI brain- Acute nonhemorrhagic infarct in the posterior left frontal lobe involving the primary motor cortex, with associated T2/FLAIR hyperintensity. Confluent periventricular and subcortical T2 hyperintensities bilaterally, increased from the prior study. Remote infarcts in the anterior right insular cortex, right frontal lobe operculum, and lateral left temporal lobe are new since the prior exam. Neurology recommend restarting plavix  and aspirin . PT/OT  2.   End-stage renal disease on hemodialysis.    Dialysis received yesterday, UF 1.6L  achieved. Next treatment scheduled for Tuesday, per holiday schedule. Will defer discharge to primary team.    3. Anemia of chronic kidney disease Hemoglobin & Hematocrit     Component Value Date/Time   HGB 10.0 (L) 08/17/2024 0802   HCT 31.4 (L)  08/17/2024 0802    Patient receives Mircera at outpatient clinic.  Hgb stable.   4. Secondary Hyperparathyroidism: with outpatient labs: PTH 652, phosphorus 5.2, corr calcium  08/06/2024    Latest Reference Range & Units 08/17/24 08:03  Calcium  8.9 - 10.3 mg/dL 8.2 (L)  Phosphorus 2.5 - 4.6 mg/dL 5.3 (H)  (L): Data is abnormally low (H): Data is abnormally high  Patient prescribed calcitriol, Cinacalcet and calcium  acetate outpatient. Continue Calcium  acetate with meals.  5.  Hypertension chronic kidney disease  Patient currently not on any antihypertensive medications. Blood pressure acceptable for this patient.        LOS: 3 Palmer Fahrner 11/24/202511:49 AM

## 2024-08-18 NOTE — Plan of Care (Signed)
  Problem: Coping: Goal: Will verbalize positive feelings about self Outcome: Progressing   Problem: Self-Care: Goal: Ability to participate in self-care as condition permits will improve Outcome: Progressing   Problem: Nutrition: Goal: Risk of aspiration will decrease Outcome: Progressing   Problem: Education: Goal: Knowledge of General Education information will improve Description: Including pain rating scale, medication(s)/side effects and non-pharmacologic comfort measures Outcome: Progressing   Problem: Health Behavior/Discharge Planning: Goal: Ability to manage health-related needs will improve Outcome: Progressing   Problem: Activity: Goal: Risk for activity intolerance will decrease Outcome: Progressing   Problem: Nutrition: Goal: Adequate nutrition will be maintained Outcome: Progressing   Problem: Coping: Goal: Level of anxiety will decrease Outcome: Progressing   Problem: Pain Managment: Goal: General experience of comfort will improve and/or be controlled Outcome: Progressing   Problem: Safety: Goal: Ability to remain free from injury will improve Outcome: Progressing

## 2024-08-18 NOTE — Progress Notes (Addendum)
 Pt receives outpt HD at [facility] on MWF 6:30am. Navigator following to assist with any HD needs.  Suzen Satchel Dialysis Navigator [phone]

## 2024-08-19 DIAGNOSIS — I639 Cerebral infarction, unspecified: Secondary | ICD-10-CM | POA: Diagnosis not present

## 2024-08-19 LAB — GLUCOSE, CAPILLARY
Glucose-Capillary: 101 mg/dL — ABNORMAL HIGH (ref 70–99)
Glucose-Capillary: 101 mg/dL — ABNORMAL HIGH (ref 70–99)
Glucose-Capillary: 84 mg/dL (ref 70–99)
Glucose-Capillary: 84 mg/dL (ref 70–99)
Glucose-Capillary: 87 mg/dL (ref 70–99)
Glucose-Capillary: 97 mg/dL (ref 70–99)

## 2024-08-19 LAB — CBC
HCT: 32.7 % — ABNORMAL LOW (ref 36.0–46.0)
Hemoglobin: 10.6 g/dL — ABNORMAL LOW (ref 12.0–15.0)
MCH: 27.2 pg (ref 26.0–34.0)
MCHC: 32.4 g/dL (ref 30.0–36.0)
MCV: 84.1 fL (ref 80.0–100.0)
Platelets: 230 K/uL (ref 150–400)
RBC: 3.89 MIL/uL (ref 3.87–5.11)
RDW: 17.1 % — ABNORMAL HIGH (ref 11.5–15.5)
WBC: 9 K/uL (ref 4.0–10.5)
nRBC: 0 % (ref 0.0–0.2)

## 2024-08-19 LAB — RENAL FUNCTION PANEL
Albumin: 4 g/dL (ref 3.5–5.0)
Anion gap: 16 — ABNORMAL HIGH (ref 5–15)
BUN: 33 mg/dL — ABNORMAL HIGH (ref 8–23)
CO2: 23 mmol/L (ref 22–32)
Calcium: 8.4 mg/dL — ABNORMAL LOW (ref 8.9–10.3)
Chloride: 90 mmol/L — ABNORMAL LOW (ref 98–111)
Creatinine, Ser: 5.33 mg/dL — ABNORMAL HIGH (ref 0.44–1.00)
GFR, Estimated: 8 mL/min — ABNORMAL LOW (ref >60)
Glucose, Bld: 112 mg/dL — ABNORMAL HIGH (ref 70–99)
Phosphorus: 6.4 mg/dL — ABNORMAL HIGH (ref 2.5–4.6)
Potassium: 4.6 mmol/L (ref 3.5–5.1)
Sodium: 130 mmol/L — ABNORMAL LOW (ref 135–145)

## 2024-08-19 MED ORDER — HEPARIN SODIUM (PORCINE) 1000 UNIT/ML IJ SOLN
INTRAMUSCULAR | Status: AC
  Filled 2024-08-19: qty 4

## 2024-08-19 NOTE — Progress Notes (Signed)
 Hemodialysis Note:  Received patient in bed to unit. Alert and oriented. Informed consent singed and in chart.  Treatment initiated: 0812 Treatment completed: 1145  Access used: Right Subclavian catheter Access issues: None  Patient tolerated well. Transported back to room, alert without acute distress. Report given to patient's RN.  Total UF removed: 1.5 liters Medications given: None  Post HD weight: 71 Kg  [facility]

## 2024-08-19 NOTE — Plan of Care (Signed)

## 2024-08-19 NOTE — Plan of Care (Signed)
  Problem: Ischemic Stroke/TIA Tissue Perfusion: Goal: Complications of ischemic stroke/TIA will be minimized Outcome: Progressing   Problem: Education: Goal: Knowledge of disease or condition will improve Outcome: Progressing   Problem: Coping: Goal: Will verbalize positive feelings about self Outcome: Progressing Goal: Will identify appropriate support needs Outcome: Progressing   Problem: Health Behavior/Discharge Planning: Goal: Ability to manage health-related needs will improve Outcome: Progressing Goal: Goals will be collaboratively established with patient/family Outcome: Progressing   Problem: Self-Care: Goal: Ability to participate in self-care as condition permits will improve Outcome: Progressing Goal: Verbalization of feelings and concerns over difficulty with self-care will improve Outcome: Progressing Goal: Ability to communicate needs accurately will improve Outcome: Progressing   Problem: Nutrition: Goal: Risk of aspiration will decrease Outcome: Progressing Goal: Dietary intake will improve Outcome: Progressing

## 2024-08-19 NOTE — Progress Notes (Signed)
 [facility]  ROUNDING NOTE   Subjective:   Patient is known to our practice and receives outpatient dialysis treatments at [facility] on a MWF schedule, supervised by Dr. Marcelino.  Update: Patient seen and evaluated on dialysis. Verbal consent with son over the phone since patient cannot move right arm to sign. No issues with dialysis. Patient agitated with loss of function to right hand.  Patient seen and evaluated during dialysis   HEMODIALYSIS FLOWSHEET:  Blood Flow Rate (mL/min): 349 mL/min Arterial Pressure (mmHg): -168.27 mmHg Venous Pressure (mmHg): 155.14 mmHg TMP (mmHg): 16.77 mmHg Ultrafiltration Rate (mL/min): 686 mL/min Dialysate Flow Rate (mL/min): 300 ml/min Bolus Amount (mL): 100 mL  No function in right hand Awaiting rehab placement   Objective:  Vital signs in last 24 hours:  Temp:  [97.5 F (36.4 C)-98.3 F (36.8 C)] 97.7 F (36.5 C) (11/25 0756) Pulse Rate:  [80-101] 101 (11/25 0930) Resp:  [16-21] 20 (11/25 0930) BP: (109-140)/(53-72) 110/66 (11/25 0930) SpO2:  [100 %] 100 % (11/25 0930) Weight:  [72.5 kg] 72.5 kg (11/25 0756)  Weight change:  Filed Weights   08/17/24 0755 08/17/24 1141 08/19/24 0756  Weight: 74.4 kg 71.4 kg 72.5 kg    Intake/Output: I/O last 3 completed shifts: In: 120 [P.O.:120] Out: -    Intake/Output this shift:  No intake/output data recorded.  Physical Exam: General: NAD  Head: Normocephalic  Lungs:  Clear, room air  Heart: Regular   Abdomen:  Soft,   Extremities:  Trace peripheral edema.  Neurologic: Alert, awake  Skin: dry  Access: Rt chest Permcath    Basic Metabolic Panel: Recent Labs  Lab 08/15/24 1427 08/16/24 0548 08/17/24 0803 08/19/24 0800  NA 139 137 135 130*  K 4.3 3.9 4.2 4.6  CL 96* 99 98 90*  CO2 29 26 24 23   GLUCOSE 98 78 85 112*  BUN 12 18 29* 33*  CREATININE 2.72* 3.65* 5.15* 5.33*  CALCIUM  8.8* 8.2* 8.2* 8.4*  PHOS  --  4.4 5.3* 6.4*    Liver Function  Tests: Recent Labs  Lab 08/15/24 1427 08/16/24 0548 08/17/24 0803 08/19/24 0800  AST 24  --   --   --   ALT 17  --   --   --   ALKPHOS 174*  --   --   --   BILITOT 0.4  --   --   --   PROT 8.5*  --   --   --   ALBUMIN  4.3 3.8 3.7 4.0   No results for input(s): LIPASE, AMYLASE in the last 168 hours. No results for input(s): AMMONIA in the last 168 hours.  CBC: Recent Labs  Lab 08/15/24 1427 08/16/24 0548 08/17/24 0802 08/19/24 0800  WBC 7.9 7.7 7.7 9.0  NEUTROABS 5.5  --   --   --   HGB 11.2* 9.9* 10.0* 10.6*  HCT 35.2* 31.2* 31.4* 32.7*  MCV 86.3 86.9 86.0 84.1  PLT 186 189 191 230    Cardiac Enzymes: No results for input(s): CKTOTAL, CKMB, CKMBINDEX, TROPONINI in the last 168 hours.  BNP: Invalid input(s): POCBNP  CBG: Recent Labs  Lab 08/18/24 1647 08/18/24 2039 08/19/24 0047 08/19/24 0422 08/19/24 0446  GLUCAP 145* 153* 84 87 97    Microbiology: Results for orders placed or performed during the hospital encounter of 01/20/22  Blood culture (routine x 2)     Status: None   Collection Time: 01/20/22  7:22 PM   Specimen: BLOOD  Result  Value Ref Range Status   Specimen Description BLOOD BLOOD RIGHT HAND  Final   Special Requests   Final    BOTTLES DRAWN AEROBIC AND ANAEROBIC Blood Culture results may not be optimal due to an inadequate volume of blood received in culture bottles   Culture   Final    NO GROWTH 5 DAYS Performed at [facility], [address] [place], [place] [address]    Report Status 01/25/2022 FINAL  Final  Blood culture (routine x 2)     Status: None   Collection Time: 01/20/22  7:22 PM   Specimen: BLOOD  Result Value Ref Range Status   Specimen Description BLOOD BLOOD LEFT HAND  Final   Special Requests   Final    BOTTLES DRAWN AEROBIC AND ANAEROBIC Blood Culture results may not be optimal due to an inadequate volume of blood received in culture bottles   Culture   Final    NO GROWTH 5 DAYS Performed  at [facility], [address] [place], [place] [address]    Report Status 01/25/2022 FINAL  Final  Resp Panel by RT-PCR (Flu A&B, Covid) Nasopharyngeal Swab     Status: None   Collection Time: 01/20/22  7:28 PM   Specimen: Nasopharyngeal Swab; Nasopharyngeal(NP) swabs in vial transport medium  Result Value Ref Range Status   SARS Coronavirus 2 by RT PCR NEGATIVE NEGATIVE Final    Comment: (NOTE) SARS-CoV-2 target nucleic acids are NOT DETECTED.  The SARS-CoV-2 RNA is generally detectable in upper respiratory specimens during the acute phase of infection. The lowest concentration of SARS-CoV-2 viral copies this assay can detect is 138 copies/mL. A negative result does not preclude SARS-Cov-2 infection and should not be used as the sole basis for treatment or other patient management decisions. A negative result may occur with  improper specimen collection/handling, submission of specimen other than nasopharyngeal swab, presence of viral mutation(s) within the areas targeted by this assay, and inadequate number of viral copies(<138 copies/mL). A negative result must be combined with clinical observations, patient history, and epidemiological information. The expected result is Negative.  Fact Sheet for Patients:  bloggercourse.com  Fact Sheet for Healthcare Providers:  seriousbroker.it  This test is no t yet approved or cleared by the [place]  FDA and  has been authorized for detection and/or diagnosis of SARS-CoV-2 by FDA under an Emergency Use Authorization (EUA). This EUA will remain  in effect (meaning this test can be used) for the duration of the COVID-19 declaration under Section 564(b)(1) of the Act, 21 U.S.C.section 360bbb-3(b)(1), unless the authorization is terminated  or revoked sooner.       Influenza A by PCR NEGATIVE NEGATIVE Final   Influenza B by PCR NEGATIVE NEGATIVE Final    Comment:  (NOTE) The Xpert Xpress SARS-CoV-2/FLU/RSV plus assay is intended as an aid in the diagnosis of influenza from Nasopharyngeal swab specimens and should not be used as a sole basis for treatment. Nasal washings and aspirates are unacceptable for Xpert Xpress SARS-CoV-2/FLU/RSV testing.  Fact Sheet for Patients: bloggercourse.com  Fact Sheet for Healthcare Providers: seriousbroker.it  This test is not yet approved or cleared by the [place]  FDA and has been authorized for detection and/or diagnosis of SARS-CoV-2 by FDA under an Emergency Use Authorization (EUA). This EUA will remain in effect (meaning this test can be used) for the duration of the COVID-19 declaration under Section 564(b)(1) of the Act, 21 U.S.C. section 360bbb-3(b)(1), unless the authorization is terminated or revoked.  Performed at [facility], [address] [place], [place] [address]     Coagulation Studies: No results for input(s): LABPROT, INR in the last 72 hours.   Urinalysis: No results for input(s): COLORURINE, LABSPEC, PHURINE, GLUCOSEU, HGBUR, BILIRUBINUR, KETONESUR, PROTEINUR, UROBILINOGEN, NITRITE, LEUKOCYTESUR in the last 72 hours.  Invalid input(s): APPERANCEUR    Imaging: No results found.    Medications:     aspirin  EC  81 mg Oral Daily   atorvastatin   40 mg Oral Daily   calcium  acetate  667 mg Oral TID WC   Chlorhexidine  Gluconate Cloth  6 each Topical Q0600   clopidogrel   75 mg Oral Daily   insulin  aspart  0-6 Units Subcutaneous Q4H   pantoprazole   40 mg Oral QHS   sertraline   100 mg Oral Daily   sodium chloride  flush  3 mL Intravenous Once   acetaminophen  **OR** acetaminophen  (TYLENOL ) oral liquid 160 mg/5 mL **OR** acetaminophen , ondansetron  (ZOFRAN ) IV, senna-docusate  Assessment/ Plan:  [name] is a 74 y.o.  female with a past medical history of ESRD on dialysis,  CVA, coronary artery disease, status post PCI, CHF with reduced ejection fraction, diabetes mellitus, and hypertension, Patient presents to the ED from her dialysis center with acute onset of slurred speech and facial droop there. She was also experiencing worsened weakness/numbness of the right upper extremity relative to her baseline. Code Stroke was called.    [facility]/MWF/Rt PC/74.0 kg   Acute versus subacute ischemic CVA anterior left temporal lobe status post tenecteplase  administration.  History of recurrent CVA. Loop recorder placed on 08/08/23. Prescribed aspirin  and Plavix  outpatient. CT head-likely acute or subacute nonhemorrhagic infarct in the anterior left temporal lobe measuring up to moderate confluent subcortical white matter hypoattenuation stable.  MRI brain- Acute nonhemorrhagic infarct in the posterior left frontal lobe involving the primary motor cortex, with associated T2/FLAIR hyperintensity. Confluent periventricular and subcortical T2 hyperintensities bilaterally, increased from the prior study. Remote infarcts in the anterior right insular cortex, right frontal lobe operculum, and lateral left temporal lobe are new since the prior exam. Neurology recommend restarting plavix  and aspirin . Will d/c to [facility] re[facility]  2.   End-stage renal disease on hemodialysis.    Receiving dialysis today, UF goal 1-1.5L as tolerated. Next treatment scheduled for Friday   3. Anemia of chronic kidney disease Hemoglobin & Hematocrit     Component Value Date/Time   HGB 10.6 (L) 08/19/2024 0800   HCT 32.7 (L) 08/19/2024 0800    Patient receives Mircera at outpatient clinic.  Hgb stable.   4. Secondary Hyperparathyroidism: with outpatient labs: PTH 652, phosphorus 5.2, corr calcium  08/06/2024    Latest Reference Range & Units 08/17/24 08:03  Calcium  8.9 - 10.3 mg/dL 8.2 (L)  Phosphorus 2.5 - 4.6 mg/dL 5.3 (H)  (L): Data is abnormally low (H): Data is abnormally  high  Patient prescribed calcitriol, Cinacalcet and calcium  acetate outpatient. Continue Calcium  acetate with meals.  5.  Hypertension chronic kidney disease  Patient currently not on any antihypertensive medications. Blood pressure 96/61 during dialysis.       LOS: 4 Reon Hunley 11/25/20259:59 AM

## 2024-08-19 NOTE — Progress Notes (Signed)
 PT Cancellation Note  Patient Details Name: Deanna [name] MRN: [id number] DOB: 04-03-1951   Cancelled Treatment:     PT attempt. Pt currently off floor in HD. Will continue to follow and progress per current POC.    [name] 08/19/2024, 8:05 AM

## 2024-08-19 NOTE — Progress Notes (Signed)
 Progress Note    Deanna [name]  FMW:[id number] DOB: 1951-02-11  DOA: 08/15/2024 PCP: Brutus Delon SAUNDERS, NP      Brief Narrative:    Medical records reviewed and are as summarized below:  Deanna [name] is a 73 y.o. female with medical history significant for CVA, ESRD on HD, extensive PAD s/p angioplasty and stent, history of HFrEF with recovered EF, hypertension, hyperlipidemia, type 2 diabetes, CAD, DISH arthritis, C3 anterior osteophyte fracture, was brought from the outpatient dialysis center to the ED because of right facial droop and slurred speech.  She also complained of numbness and weakness in the right upper extremity.  ED Course: Initial Vital Signs: pulse 92, RR 16, BP 164/79, SpO2 98% on room air Significant Labs: Creatinine 2.7, Alkaline phosphatase 174, hemoglobin 11.2, hematocrit 35.2 Ethyl alcohol <15 Imaging CT Head>>IMPRESSION: 1. Likely acute or subacute nonhemorrhagic infarct in the anterior left temporal lobe measuring up to 3.8 cm. 2. Moderate confluent periventricular and subcortical white matter hypoattenuation, stable. 3. ASPECTS: 8. CT Angio Head & Neck>>IMPRESSION: 1. No acute large vessel occlusion. 2. Extensive predominantly calcified plaque along the proximal right cervical ICA with positive remodeling resulting in less than 50% stenosis.   She was by the neurologist for acute stroke.  She was given TNK at 3:15 PM on 08/15/2024.  She was admitted to the ICU for further management.     Assessment/Plan:   Principal Problem:   Stroke (cerebrum) (HCC)   Body mass index is 29.58 kg/m.   Acute left frontal lobe nonhemorrhagic stroke: S/p tenecteplase  on 08/15/2024 at 3:15 PM. Follow-up MRI brain showed acute nonhemorrhagic infarct in the posterior left frontal lobe. Continue aspirin , Plavix  and Lipitor. Limited 2D echo view because patient was not cooperative. LDL 105, hemoglobin A1c 5.7 PT OT recommended discharge to SNF.   Follow-up with TOC to assist with disposition.    ESRD on HD: Follow-up with nephrologist for hemodialysis.  She had hemodialysis today   Type II DM: NovoLog  as needed for hyperglycemia. Hemoglobin A1c 5.7.   Comorbidities include hyperlipidemia, hypertension, PAD s/p stent, CAD, prior history of strokes, history of C3 anterior osteophyte fracture.  She is medically stable for discharge.  Awaiting placement to SNF.  Follow-up with TOC for disposition.    Diet Order             Diet renal with fluid restriction           Diet Heart Room service appropriate? Yes; Fluid consistency: Thin  Diet effective now                                  Consultants: Intensivist Neurologist Nephrologist  Procedures: None    Medications:    aspirin  EC  81 mg Oral Daily   atorvastatin   40 mg Oral Daily   calcium  acetate  667 mg Oral TID WC   Chlorhexidine  Gluconate Cloth  6 each Topical Q0600   clopidogrel   75 mg Oral Daily   insulin  aspart  0-6 Units Subcutaneous Q4H   pantoprazole   40 mg Oral QHS   sertraline   100 mg Oral Daily   sodium chloride  flush  3 mL Intravenous Once   Continuous Infusions:   Anti-infectives (From admission, onward)    None              Family Communication/Anticipated D/C date and plan/Code Status   DVT prophylaxis: SCD's Start: 08/15/24  1522     Code Status: Full Code  Family Communication: None Disposition Plan: Plan to discharge to SNF   Status is: Inpatient Remains inpatient appropriate because: Acute stroke       Subjective:   Interval events noted.  She complains of feeling worn out.  She just got back from dialysis.  Objective:    Vitals:   08/19/24 1130 08/19/24 1145 08/19/24 1250 08/19/24 1350  BP: (!) 88/55 106/60 (!) 96/58 116/60  Pulse: (!) 107 (!) 107 (!) 108 (!) 104  Resp: 17 (!) 22 15 18   Temp:  97.6 F (36.4 C) 98.3 F (36.8 C)   TempSrc:  Oral Oral   SpO2: 100% 100% 100%    Weight:  71 kg    Height:       No data found.   Intake/Output Summary (Last 24 hours) at 08/19/2024 1554 Last data filed at 08/19/2024 1300 Gross per 24 hour  Intake 120 ml  Output 1500 ml  Net -1380 ml    Filed Weights   08/17/24 1141 08/19/24 0756 08/19/24 1145  Weight: 71.4 kg 72.5 kg 71 kg    Exam:  GEN: NAD SKIN: Warm and dry EYES: No pallor or icterus ENT: MMM CV: RRR PULM: CTA B ABD: soft, ND, NT, +BS CNS: AAO x 3, right upper extremity weakness EXT: No edema or tenderness       Data Reviewed:   I have personally reviewed following labs and imaging studies:  Labs: Labs show the following:   Basic Metabolic Panel: Recent Labs  Lab 08/15/24 1427 08/16/24 0548 08/17/24 0803 08/19/24 0800  NA 139 137 135 130*  K 4.3 3.9 4.2 4.6  CL 96* 99 98 90*  CO2 29 26 24 23   GLUCOSE 98 78 85 112*  BUN 12 18 29* 33*  CREATININE 2.72* 3.65* 5.15* 5.33*  CALCIUM  8.8* 8.2* 8.2* 8.4*  PHOS  --  4.4 5.3* 6.4*   GFR Estimated Creatinine Clearance: 8.6 mL/min (A) (by C-G formula based on SCr of 5.33 mg/dL (H)). Liver Function Tests: Recent Labs  Lab 08/15/24 1427 08/16/24 0548 08/17/24 0803 08/19/24 0800  AST 24  --   --   --   ALT 17  --   --   --   ALKPHOS 174*  --   --   --   BILITOT 0.4  --   --   --   PROT 8.5*  --   --   --   ALBUMIN  4.3 3.8 3.7 4.0   No results for input(s): LIPASE, AMYLASE in the last 168 hours. No results for input(s): AMMONIA in the last 168 hours. Coagulation profile Recent Labs  Lab 08/15/24 1427  INR 1.0    CBC: Recent Labs  Lab 08/15/24 1427 08/16/24 0548 08/17/24 0802 08/19/24 0800  WBC 7.9 7.7 7.7 9.0  NEUTROABS 5.5  --   --   --   HGB 11.2* 9.9* 10.0* 10.6*  HCT 35.2* 31.2* 31.4* 32.7*  MCV 86.3 86.9 86.0 84.1  PLT 186 189 191 230   Cardiac Enzymes: No results for input(s): CKTOTAL, CKMB, CKMBINDEX, TROPONINI in the last 168 hours. BNP (last 3 results) No results for input(s):  PROBNP in the last 8760 hours. CBG: Recent Labs  Lab 08/18/24 2039 08/19/24 0047 08/19/24 0422 08/19/24 0446 08/19/24 1254  GLUCAP 153* 84 87 97 84   D-Dimer: No results for input(s): DDIMER in the last 72 hours. Hgb A1c: No results for input(s): HGBA1C in  the last 72 hours.  Lipid Profile: No results for input(s): CHOL, HDL, LDLCALC, TRIG, CHOLHDL, LDLDIRECT in the last 72 hours.  Thyroid function studies: No results for input(s): TSH, T4TOTAL, T3FREE, THYROIDAB in the last 72 hours.  Invalid input(s): FREET3 Anemia work up: No results for input(s): VITAMINB12, FOLATE, FERRITIN, TIBC, IRON, RETICCTPCT in the last 72 hours. Sepsis Labs: Recent Labs  Lab 08/15/24 1427 08/16/24 0548 08/17/24 0802 08/19/24 0800  WBC 7.9 7.7 7.7 9.0    Microbiology No results found for this or any previous visit (from the past 240 hours).  Procedures and diagnostic studies:  No results found.              LOS: 4 days   Deanna [name]  [facility]   Pager on [email or web address]. If 7PM-7AM, please contact night-coverage at [email or web address]     08/19/2024, 3:54 PM

## 2024-08-19 NOTE — Progress Notes (Signed)
 Pt refused a full assessment.

## 2024-08-19 NOTE — TOC Progression Note (Signed)
 Transition of Care [facility]) - Progression Note    Patient Details  Name: Deanna [name] MRN: [id number] Date of Birth: 1951-03-26  Transition of [facility]) CM/SW Contact  Daved JONETTA Hamilton, RN Phone Number: 08/19/2024, 2:24 PM  Clinical Narrative:     Late Entry:  Received call from Briana at [facility]. [name] advised that patient called the facility last night after hours and was verbally rude and cursing to staff on the phone regarding not being able to come to the facility at that time. Due to this behavior from the patient, [facility] is no longer offering her a bed for STR.  Spoke with patient at bedside upon her return from dialysis. Discussed with patient above encounter and that this CM would continue searching for STR bed. Discussed with patient that she did not need to call facilities, and that [facility] will discuss with her choice once bed offer(s) are received. Patient verbalized understanding.   No bed offers at the time of this documentation. TOC will continue to follow.   Expected Discharge Plan: Skilled Nursing Facility Barriers to Discharge: Insurance Authorization               Expected Discharge Plan and Services   Discharge Planning Services: CM Consult Post Acute Care Choice: Skilled Nursing Facility Living arrangements for the past 2 months: Assisted Living Facility Expected Discharge Date: 08/18/24                                     Social Drivers of Health (SDOH) Interventions SDOH Screenings   Food Insecurity: Patient Declined (08/17/2024)  Housing: Unknown (08/17/2024)  Transportation Needs: Patient Declined (08/17/2024)  Utilities: Patient Declined (08/17/2024)  Depression (PHQ2-9): Low Risk  (06/12/2024)  Financial Resource Strain: Low Risk  (07/22/2024)   Received from [facility]  Social Connections: Patient Declined (08/17/2024)  Tobacco Use: Low Risk  (08/15/2024)    Readmission Risk Interventions    01/16/2022     3:45 PM 12/29/2021    2:33 PM  Readmission Risk Prevention Plan  Transportation Screening Complete Complete  PCP or Specialist Appt within 5-7 Days  Complete  Home Care Screening  Complete  Medication Review (RN CM)  Complete  Medication Review (RN Care Manager) Complete   PCP or Specialist appointment within 3-5 days of discharge Complete   HRI or Home Care Consult Complete   SW Recovery Care/Counseling Consult Complete   Palliative Care Screening Not Applicable   Skilled Nursing Facility Not Applicable

## 2024-08-20 DIAGNOSIS — I639 Cerebral infarction, unspecified: Secondary | ICD-10-CM | POA: Diagnosis not present

## 2024-08-20 LAB — GLUCOSE, CAPILLARY
Glucose-Capillary: 105 mg/dL — ABNORMAL HIGH (ref 70–99)
Glucose-Capillary: 106 mg/dL — ABNORMAL HIGH (ref 70–99)
Glucose-Capillary: 112 mg/dL — ABNORMAL HIGH (ref 70–99)
Glucose-Capillary: 117 mg/dL — ABNORMAL HIGH (ref 70–99)
Glucose-Capillary: 139 mg/dL — ABNORMAL HIGH (ref 70–99)

## 2024-08-20 MED ORDER — HEPARIN SODIUM (PORCINE) 5000 UNIT/ML IJ SOLN
5000.0000 [IU] | Freq: Three times a day (TID) | INTRAMUSCULAR | Status: DC
  Administered 2024-08-20 – 2024-09-05 (×29): 5000 [IU] via SUBCUTANEOUS
  Filled 2024-08-20 (×34): qty 1

## 2024-08-20 NOTE — Progress Notes (Signed)
 PROGRESS NOTE    Deanna [name]   FMW:[id number] DOB: 1951/09/06  DOA: 08/15/2024 Date of Service: 08/20/24 which is hospital day 5  PCP: Brutus Delon SAUNDERS, NP    Hospital course / significant events:   Deanna [name] is a 73 y.o. female with medical history significant for CVA, ESRD on HD, extensive PAD s/p angioplasty and stent, history of HFrEF with recovered EF, hypertension, hyperlipidemia, type 2 diabetes, CAD, DISH arthritis, C3 anterior osteophyte fracture, was brought from the outpatient dialysis center to the ED because of right facial droop and slurred speech.  She also complained of numbness and weakness in the right upper extremity.   11/21:She was by the neurologist for acute stroke.  She was given TNK at 3:15 PM on 08/15/2024. Admitted to ICU  11/22: stable, neuro s/o  11/23: PT/OT recs for SNF 11/24 - 11/26: awaiting SNF placement. Pt was going to [facility] but see TOC note, they have rescinded their offer, we are now waiting to hear back from [employer]  Consultants:  Neurology  Procedures/Surgeries: none      ASSESSMENT & PLAN:   Acute left frontal lobe nonhemorrhagic stroke S/p tenecteplase  on 08/15/2024 at 3:15 PM. Follow-up MRI brain showed acute nonhemorrhagic infarct in the posterior left frontal lobe. Continue aspirin , Plavix  and Lipitor. Limited 2D echo view because patient was not cooperative. PT OT recommended discharge to SNF.  Follow-up with TOC to assist with disposition.   ESRD on HD Follow-up with nephrologist for hemodialysis.    Type II DM well controlled Glc have been appropriate, have dc Glc checks q4 and hold further insulin   Monitor periodic BMP / POC Glc prn suspect hypoglycemia   Comorbidities include hyperlipidemia, hypertension, PAD s/p stent, CAD, prior history of strokes, history of C3 anterior osteophyte fracture.   She is medically stable for discharge.  Awaiting placement to SNF.  Follow-up with TOC for  disposition.   Class 1 obesity based on BMI: Body mass index is 29.58 kg/m.SABRA Significantly low or high BMI is associated with higher medical risk.  Underweight - under 18  overweight - 25 to 29 obese - 30 or more Class 1 obesity: BMI of 30.0 to 34 Class 2 obesity: BMI of 35.0 to 39 Class 3 obesity: BMI of 40.0 to 49 Super Morbid Obesity: BMI 50-59 Super-super Morbid Obesity: BMI 60+ Healthy nutrition and physical activity advised as adjunct to other disease management and risk reduction treatments    DVT prophylaxis: heparin  IV fluids: no continuous IV fluids  Nutrition: cardiac diet  Central lines / other devices: none  Code Status: FULL CODE ACP documentation reviewed: Advanced directive is on file in VYNCA  [facility] needs: placement Medical barriers to dispo: none.              Subjective / Brief ROS:  Patient reports unhappy that she's still here. I sympathized and reiterated please leave contacting SNF facilities to the case management team - see TOC note Denies CP/SOB.  Pain controlled.  Denies new weakness.  Tolerating diet.    Family Communication: none at this time     Objective Findings:  Vitals:   08/19/24 2359 08/20/24 0359 08/20/24 0744 08/20/24 1143  BP: 132/71 (!) 112/59 (!) 103/55 (!) 97/40  Pulse: (!) 102 (!) 101 98 91  Resp: 16 16 20 19   Temp: 98.6 F (37 C) 98.7 F (37.1 C) 98.5 F (36.9 C) 97.8 F (36.6 C)  TempSrc: Oral Oral Oral Oral  SpO2: 100% 100% 99% 100%  Weight:      Height:        Intake/Output Summary (Last 24 hours) at 08/20/2024 1614 Last data filed at 08/19/2024 1900 Gross per 24 hour  Intake 0 ml  Output --  Net 0 ml   Filed Weights   08/17/24 1141 08/19/24 0756 08/19/24 1145  Weight: 71.4 kg 72.5 kg 71 kg    Examination:  Physical Exam Constitutional:      General: She is not in acute distress.    Appearance: She is not ill-appearing.  Cardiovascular:     Rate and Rhythm: Normal rate and regular  rhythm.  Pulmonary:     Effort: Pulmonary effort is normal.     Breath sounds: Normal breath sounds.  Neurological:     Mental Status: She is alert and oriented to person, place, and time.  Psychiatric:     Comments: Upset but not agitated/combative.          Scheduled Medications:   aspirin  EC  81 mg Oral Daily   atorvastatin   40 mg Oral Daily   calcium  acetate  667 mg Oral TID WC   Chlorhexidine  Gluconate Cloth  6 each Topical Q0600   clopidogrel   75 mg Oral Daily   heparin  injection (subcutaneous)  5,000 Units Subcutaneous Q8H   pantoprazole   40 mg Oral QHS   sertraline   100 mg Oral Daily   sodium chloride  flush  3 mL Intravenous Once    Continuous Infusions:   PRN Medications:  acetaminophen  **OR** acetaminophen  (TYLENOL ) oral liquid 160 mg/5 mL **OR** acetaminophen , ondansetron  (ZOFRAN ) IV, senna-docusate  Antimicrobials from admission:  Anti-infectives (From admission, onward)    None           Data Reviewed:  I have personally reviewed the following...  CBC: Recent Labs  Lab 08/15/24 1427 08/16/24 0548 08/17/24 0802 08/19/24 0800  WBC 7.9 7.7 7.7 9.0  NEUTROABS 5.5  --   --   --   HGB 11.2* 9.9* 10.0* 10.6*  HCT 35.2* 31.2* 31.4* 32.7*  MCV 86.3 86.9 86.0 84.1  PLT 186 189 191 230   Basic Metabolic Panel: Recent Labs  Lab 08/15/24 1427 08/16/24 0548 08/17/24 0803 08/19/24 0800  NA 139 137 135 130*  K 4.3 3.9 4.2 4.6  CL 96* 99 98 90*  CO2 29 26 24 23   GLUCOSE 98 78 85 112*  BUN 12 18 29* 33*  CREATININE 2.72* 3.65* 5.15* 5.33*  CALCIUM  8.8* 8.2* 8.2* 8.4*  PHOS  --  4.4 5.3* 6.4*   GFR: Estimated Creatinine Clearance: 8.6 mL/min (A) (by C-G formula based on SCr of 5.33 mg/dL (H)). Liver Function Tests: Recent Labs  Lab 08/15/24 1427 08/16/24 0548 08/17/24 0803 08/19/24 0800  AST 24  --   --   --   ALT 17  --   --   --   ALKPHOS 174*  --   --   --   BILITOT 0.4  --   --   --   PROT 8.5*  --   --   --   ALBUMIN  4.3 3.8  3.7 4.0   No results for input(s): LIPASE, AMYLASE in the last 168 hours. No results for input(s): AMMONIA in the last 168 hours. Coagulation Profile: Recent Labs  Lab 08/15/24 1427  INR 1.0   Cardiac Enzymes: No results for input(s): CKTOTAL, CKMB, CKMBINDEX, TROPONINI in the last 168 hours. BNP (last 3 results) No results for input(s): PROBNP in the last 8760 hours. HbA1C:  No results for input(s): HGBA1C in the last 72 hours. CBG: Recent Labs  Lab 08/19/24 1946 08/20/24 0006 08/20/24 0400 08/20/24 0751 08/20/24 1226  GLUCAP 101* 139* 106* 105* 112*   Lipid Profile: No results for input(s): CHOL, HDL, LDLCALC, TRIG, CHOLHDL, LDLDIRECT in the last 72 hours. Thyroid Function Tests: No results for input(s): TSH, T4TOTAL, FREET4, T3FREE, THYROIDAB in the last 72 hours. Anemia Panel: No results for input(s): VITAMINB12, FOLATE, FERRITIN, TIBC, IRON, RETICCTPCT in the last 72 hours. Most Recent Urinalysis On File:     Component Value Date/Time   COLORURINE YELLOW (A) 12/26/2021 1621   APPEARANCEUR HAZY (A) 12/26/2021 1621   LABSPEC 1.011 12/26/2021 1621   PHURINE 5.0 12/26/2021 1621   GLUCOSEU 150 (A) 12/26/2021 1621   HGBUR SMALL (A) 12/26/2021 1621   BILIRUBINUR NEGATIVE 12/26/2021 1621   KETONESUR NEGATIVE 12/26/2021 1621   PROTEINUR 100 (A) 12/26/2021 1621   NITRITE NEGATIVE 12/26/2021 1621   LEUKOCYTESUR TRACE (A) 12/26/2021 1621   Sepsis Labs: @LABRCNTIP (procalcitonin:4,lacticidven:4) Microbiology: No results found for this or any previous visit (from the past 240 hours).    Radiology Studies last 3 days: No results found.         Tenise Stetler, DO [facility] 08/20/2024, 4:14 PM    Dictation software may have been used to generate the above note. Typos may occur and escape review in typed/dictated notes. Please contact Dr Marsa directly for clarity if needed.  Staff may message  me via secure chat in Epic  but this may not receive an immediate response,  please page me for urgent matters!  If 7PM-7AM, please contact night coverage [email or web address]

## 2024-08-20 NOTE — Hospital Course (Addendum)
 Hospital course / significant events:   Deanna [name] is a 73 y.o. female with medical history significant for CVA, ESRD on HD, extensive PAD s/p angioplasty and stent, history of HFrEF with recovered EF, hypertension, hyperlipidemia, type 2 diabetes, CAD, DISH arthritis, C3 anterior osteophyte fracture, was brought from the outpatient dialysis center to the ED because of right facial droop and slurred speech.  She also complained of numbness and weakness in the right upper extremity.   11/21:She was by the neurologist for acute stroke.  She was given TNK at 3:15 PM on 08/15/2024. Admitted to ICU  11/22: stable, neuro s/o  11/23: PT/OT recs for SNF 11/24 - 12/02: awaiting SNF placement. Pt was going to [facility] but see TOC note, they have rescinded their offer, we have been waiting to hear back from alternate facility, now pending auth for [place]   Consultants:  Neurology  Procedures/Surgeries: none      ASSESSMENT & PLAN:   Acute left frontal lobe nonhemorrhagic stroke S/p tenecteplase  on 08/15/2024 at 3:15 PM. Follow-up MRI brain showed acute nonhemorrhagic infarct in the posterior left frontal lobe. Limited 2D echo view because patient was not cooperative. Continue aspirin , Plavix  and Lipitor. PT OT recommended discharge to SNF.   ESRD on HD Follow-up with nephrologist for hemodialysis.    Type II DM well controlled Glc have been appropriate, have dc Glc checks q4 and hold further insulin   Monitor periodic BMP / POC Glc prn suspect hypoglycemia   Comorbidities include hyperlipidemia, hypertension, PAD s/p stent, CAD, prior history of strokes, history of C3 anterior osteophyte fracture.   She is medically stable for discharge.  Awaiting placement to SNF.  Follow-up with TOC for disposition.   Class 1 obesity based on BMI: Body mass index is 29.58 kg/m.SABRA Significantly low or high BMI is associated with higher medical risk.  Underweight - under 18  overweight - 25 to  29 obese - 30 or more Class 1 obesity: BMI of 30.0 to 34 Class 2 obesity: BMI of 35.0 to 39 Class 3 obesity: BMI of 40.0 to 49 Super Morbid Obesity: BMI 50-59 Super-super Morbid Obesity: BMI 60+ Healthy nutrition and physical activity advised as adjunct to other disease management and risk reduction treatments    DVT prophylaxis: heparin  IV fluids: no continuous IV fluids  Nutrition: cardiac diet  Central lines / other devices: none  Code Status: FULL CODE ACP documentation reviewed: Advanced directive is on file in VYNCA  [facility] needs: placement Medical barriers to dispo: none.

## 2024-08-20 NOTE — Progress Notes (Signed)
 Occupational Therapy Treatment Patient Details Name: Deanna [name] MRN: [id number] DOB: [birth year] Today's Date: 08/20/2024   History of present illness Deanna [name] is a 73 y.o. female with a past medical history of ESRD on dialysis, CAD, CVA, status post PCI, CHF with reduced ejection fraction, DM, HTN. Patient presents to the ED from her dialysis center with acute onset of slurred speech and facial droop there. She was also experiencing worsened weakness/numbness of the right upper extremity relative to her baseline. Found to have acute left frontal lobe nonhemorrhagic stroke: S/p tenecteplase  on 08/15/2024. Follow-up MRI brain showed acute nonhemorrhagic infarct in the posterior left frontal lobe.   OT comments  Pt seen for OT/PT co-treat this date. Pt with overall flat affect and minimal engagement. Pt requires +2 MOD A for bed mobility, neuromuscular re-education exercises completed while sitting EOB to promote activation of hemiparetic triceps/bicep reciprocal pattern. Pt does not display active grip in previously dominant R hand, and lacks ability to perform functional tasks due to impaired bimanual coordination. MAX A +2 for partial stand and pt requests to return to sidelying, left repositioned to comfort with RUE supported to avoid flexor synergy pattern and to prevent shoulder subluxation. Anticipate pt will continue to require edu and reinforcement of taught exercises and positioning strategies. OT will follow, discharge recommendation appropriate.       If plan is discharge home, recommend the following:  A lot of help with walking and/or transfers;A lot of help with bathing/dressing/bathroom;Assist for transportation;Direct supervision/assist for financial management;Direct supervision/assist for medications management;Assistance with cooking/housework   Equipment Recommendations  Other (comment)       Precautions / Restrictions Precautions Precautions: Fall Recall of  Precautions/Restrictions: Impaired Restrictions Weight Bearing Restrictions Per Provider Order: No       Mobility Bed Mobility Overal bed mobility: Needs Assistance Bed Mobility: Supine to Sit, Sit to Supine     Supine to sit: Mod assist, Used rails, HOB elevated Sit to supine: Mod assist, +2 for physical assistance        Transfers Overall transfer level: Needs assistance Equipment used: 2 person hand held assist Transfers: Sit to/from Stand Sit to Stand: Mod assist, Max assist, +2 physical assistance           General transfer comment: poor standing tolerance and unable to get fully upright. Has B knee and hip flexion tightness     Balance Overall balance assessment: Needs assistance Sitting-balance support: Feet supported Sitting balance-[name]: Fair     Standing balance support: Bilateral upper extremity supported, During functional activity Standing balance-[name]: Poor                             ADL either performed or assessed with clinical judgement   ADL Overall ADL's : Needs assistance/impaired                                            Extremity/Trunk Assessment Upper Extremity Assessment RUE Deficits / Details: pt has scapular elevation/depression/protraction/retraction. no active grip, no movement at biceps. trace activation at tricep RUE Sensation: decreased light touch RUE Coordination: decreased fine motor;decreased gross motor                     Communication Communication Communication: No apparent difficulties   Cognition Arousal: Alert Behavior During Therapy: [facility]  for tasks assessed/performed, Flat affect Cognition: No family/caregiver present to determine baseline                               Following commands: Intact        Cueing   Cueing Techniques: Verbal cues, Gestural cues, Tactile cues, Visual cues  Exercises Other Exercises Other Exercises: pt instructed to  perform RUE neuromuscular re-education with assist from OT to support arm distally. focus on proximal acitvation through shoulder, pt activates through tricep with supportive assist to bring arm forwards, improved tricep activation with push-pull repitition. edu to pt on active lap slides with hand positioned palm down. edu to pt re: hemiplegic arm positioning but anticipate pt will continue to require edu and reinforcement due to flat affect to prevent shoulder subluxation            Pertinent Vitals/ Pain       Pain Assessment Pain Assessment: No/denies pain   Frequency  Min 2X/week        Progress Toward Goals  OT Goals(current goals can now be found in the care plan section)  Progress towards OT goals: Progressing toward goals  Acute Rehab OT Goals OT Goal Formulation: With patient Time For Goal Achievement: 08/31/24 Potential to Achieve Goals: Good ADL Goals Pt Will Perform Grooming: sitting;with set-up Pt Will Perform Lower Body Dressing: sitting/lateral leans;with min assist Pt Will Transfer to Toilet: ambulating;with mod assist Pt Will Perform Toileting - Clothing Manipulation and hygiene: sit to/from stand;with mod assist  Plan      Co-evaluation    PT/OT/SLP Co-Evaluation/Treatment: Yes Reason for Co-Treatment: To address functional/ADL transfers;Necessary to address cognition/behavior during functional activity;For patient/therapist safety PT goals addressed during session: Mobility/safety with mobility;Balance OT goals addressed during session: ADL's and self-care;Proper use of Adaptive equipment and DME      AM-PAC OT 6 Clicks Daily Activity     Outcome Measure   Help from another person eating meals?: A Little Help from another person taking care of personal grooming?: A Lot Help from another person toileting, which includes using toliet, bedpan, or urinal?: A Lot Help from another person bathing (including washing, rinsing, drying)?: A Lot Help from  another person to put on and taking off regular upper body clothing?: A Little Help from another person to put on and taking off regular lower body clothing?: A Lot 6 Click Score: 14    End of Session    OT Visit Diagnosis: Unsteadiness on feet (R26.81);Other abnormalities of gait and mobility (R26.89);Repeated falls (R29.6);Muscle weakness (generalized) (M62.81);Hemiplegia and hemiparesis Hemiplegia - Right/Left: Right Hemiplegia - dominant/non-dominant: Dominant Hemiplegia - caused by: Cerebral infarction   Activity Tolerance Patient tolerated treatment well   Patient Left in bed;with call bell/phone within reach;with bed alarm set   Nurse Communication Mobility status        Time: 1010-1033 OT Time Calculation (min): 23 min  Charges: OT General Charges $OT Visit: 1 Visit OT Treatments $Neuromuscular Re-education: 8-22 mins  Robby Pirani L. Jamicia Haaland, OTR/L  08/20/24, 1:26 PM

## 2024-08-20 NOTE — Progress Notes (Signed)
 [facility]  ROUNDING NOTE   Subjective:   Patient is known to our practice and receives outpatient dialysis treatments at [facility] on a MWF schedule, supervised by Dr. Marcelino.  Update: Patient seen and evaluated on dialysis. Verbal consent with son over the phone since patient cannot move right arm to sign. No issues with dialysis. Patient agitated with loss of function to right hand.  Patient sitting up in chair Reports mild nausea Room air Tolerating dialysis well yesterday   Objective:  Vital signs in last 24 hours:  Temp:  [97.6 F (36.4 C)-98.9 F (37.2 C)] 98.5 F (36.9 C) (11/26 0744) Pulse Rate:  [98-110] 98 (11/26 0744) Resp:  [15-22] 20 (11/26 0744) BP: (88-132)/(55-71) 103/55 (11/26 0744) SpO2:  [97 %-100 %] 99 % (11/26 0744) Weight:  [71 kg] 71 kg (11/25 1145)  Weight change:  Filed Weights   08/17/24 1141 08/19/24 0756 08/19/24 1145  Weight: 71.4 kg 72.5 kg 71 kg    Intake/Output: I/O last 3 completed shifts: In: 120 [P.O.:120] Out: 1500 [Other:1500]   Intake/Output this shift:  No intake/output data recorded.  Physical Exam: General: NAD  Head: Normocephalic  Lungs:  Clear, room air  Heart: Regular   Abdomen:  Soft,   Extremities:  No peripheral edema. LUE flaccid   Neurologic: Alert, awake  Skin: dry  Access: Rt chest Permcath    Basic Metabolic Panel: Recent Labs  Lab 08/15/24 1427 08/16/24 0548 08/17/24 0803 08/19/24 0800  NA 139 137 135 130*  K 4.3 3.9 4.2 4.6  CL 96* 99 98 90*  CO2 29 26 24 23   GLUCOSE 98 78 85 112*  BUN 12 18 29* 33*  CREATININE 2.72* 3.65* 5.15* 5.33*  CALCIUM  8.8* 8.2* 8.2* 8.4*  PHOS  --  4.4 5.3* 6.4*    Liver Function Tests: Recent Labs  Lab 08/15/24 1427 08/16/24 0548 08/17/24 0803 08/19/24 0800  AST 24  --   --   --   ALT 17  --   --   --   ALKPHOS 174*  --   --   --   BILITOT 0.4  --   --   --   PROT 8.5*  --   --   --   ALBUMIN  4.3 3.8 3.7 4.0   No results for  input(s): LIPASE, AMYLASE in the last 168 hours. No results for input(s): AMMONIA in the last 168 hours.  CBC: Recent Labs  Lab 08/15/24 1427 08/16/24 0548 08/17/24 0802 08/19/24 0800  WBC 7.9 7.7 7.7 9.0  NEUTROABS 5.5  --   --   --   HGB 11.2* 9.9* 10.0* 10.6*  HCT 35.2* 31.2* 31.4* 32.7*  MCV 86.3 86.9 86.0 84.1  PLT 186 189 191 230    Cardiac Enzymes: No results for input(s): CKTOTAL, CKMB, CKMBINDEX, TROPONINI in the last 168 hours.  BNP: Invalid input(s): POCBNP  CBG: Recent Labs  Lab 08/19/24 1719 08/19/24 1946 08/20/24 0006 08/20/24 0400 08/20/24 0751  GLUCAP 101* 101* 139* 106* 105*    Microbiology: Results for orders placed or performed during the hospital encounter of 01/20/22  Blood culture (routine x 2)     Status: None   Collection Time: 01/20/22  7:22 PM   Specimen: BLOOD  Result Value Ref Range Status   Specimen Description BLOOD BLOOD RIGHT HAND  Final   Special Requests   Final    BOTTLES DRAWN AEROBIC AND ANAEROBIC Blood Culture results may not be optimal due to  an inadequate volume of blood received in culture bottles   Culture   Final    NO GROWTH 5 DAYS Performed at [facility], [address] [place], [place] [address]    Report Status 01/25/2022 FINAL  Final  Blood culture (routine x 2)     Status: None   Collection Time: 01/20/22  7:22 PM   Specimen: BLOOD  Result Value Ref Range Status   Specimen Description BLOOD BLOOD LEFT HAND  Final   Special Requests   Final    BOTTLES DRAWN AEROBIC AND ANAEROBIC Blood Culture results may not be optimal due to an inadequate volume of blood received in culture bottles   Culture   Final    NO GROWTH 5 DAYS Performed at [facility], [address] [place], [place] [address]    Report Status 01/25/2022 FINAL  Final  Resp Panel by RT-PCR (Flu A&B, Covid) Nasopharyngeal Swab     Status: None   Collection Time: 01/20/22  7:28 PM   Specimen: Nasopharyngeal  Swab; Nasopharyngeal(NP) swabs in vial transport medium  Result Value Ref Range Status   SARS Coronavirus 2 by RT PCR NEGATIVE NEGATIVE Final    Comment: (NOTE) SARS-CoV-2 target nucleic acids are NOT DETECTED.  The SARS-CoV-2 RNA is generally detectable in upper respiratory specimens during the acute phase of infection. The lowest concentration of SARS-CoV-2 viral copies this assay can detect is 138 copies/mL. A negative result does not preclude SARS-Cov-2 infection and should not be used as the sole basis for treatment or other patient management decisions. A negative result may occur with  improper specimen collection/handling, submission of specimen other than nasopharyngeal swab, presence of viral mutation(s) within the areas targeted by this assay, and inadequate number of viral copies(<138 copies/mL). A negative result must be combined with clinical observations, patient history, and epidemiological information. The expected result is Negative.  Fact Sheet for Patients:  bloggercourse.com  Fact Sheet for Healthcare Providers:  seriousbroker.it  This test is no t yet approved or cleared by the [place]  FDA and  has been authorized for detection and/or diagnosis of SARS-CoV-2 by FDA under an Emergency Use Authorization (EUA). This EUA will remain  in effect (meaning this test can be used) for the duration of the COVID-19 declaration under Section 564(b)(1) of the Act, 21 U.S.C.section 360bbb-3(b)(1), unless the authorization is terminated  or revoked sooner.       Influenza A by PCR NEGATIVE NEGATIVE Final   Influenza B by PCR NEGATIVE NEGATIVE Final    Comment: (NOTE) The Xpert Xpress SARS-CoV-2/FLU/RSV plus assay is intended as an aid in the diagnosis of influenza from Nasopharyngeal swab specimens and should not be used as a sole basis for treatment. Nasal washings and aspirates are unacceptable for Xpert Xpress  SARS-CoV-2/FLU/RSV testing.  Fact Sheet for Patients: bloggercourse.com  Fact Sheet for Healthcare Providers: seriousbroker.it  This test is not yet approved or cleared by the [place]  FDA and has been authorized for detection and/or diagnosis of SARS-CoV-2 by FDA under an Emergency Use Authorization (EUA). This EUA will remain in effect (meaning this test can be used) for the duration of the COVID-19 declaration under Section 564(b)(1) of the Act, 21 U.S.C. section 360bbb-3(b)(1), unless the authorization is terminated or revoked.  Performed at [facility], [address] [place], [place] [address]     Coagulation Studies: No results for input(s): LABPROT, INR in the last 72 hours.   Urinalysis: No results for input(s): COLORURINE,  LABSPEC, PHURINE, GLUCOSEU, HGBUR, BILIRUBINUR, KETONESUR, PROTEINUR, UROBILINOGEN, NITRITE, LEUKOCYTESUR in the last 72 hours.  Invalid input(s): APPERANCEUR    Imaging: No results found.    Medications:     aspirin  EC  81 mg Oral Daily   atorvastatin   40 mg Oral Daily   calcium  acetate  667 mg Oral TID WC   Chlorhexidine  Gluconate Cloth  6 each Topical Q0600   clopidogrel   75 mg Oral Daily   insulin  aspart  0-6 Units Subcutaneous Q4H   pantoprazole   40 mg Oral QHS   sertraline   100 mg Oral Daily   sodium chloride  flush  3 mL Intravenous Once   acetaminophen  **OR** acetaminophen  (TYLENOL ) oral liquid 160 mg/5 mL **OR** acetaminophen , ondansetron  (ZOFRAN ) IV, senna-docusate  Assessment/ Plan:  Deanna [name] is a 73 y.o.  female with a past medical history of ESRD on dialysis, CVA, coronary artery disease, status post PCI, CHF with reduced ejection fraction, diabetes mellitus, and hypertension, Patient presents to the ED from her dialysis center with acute onset of slurred speech and facial droop there. She was also experiencing worsened  weakness/numbness of the right upper extremity relative to her baseline. Code Stroke was called.    [facility]/MWF/Rt PC/74.0 kg   End-stage renal disease on hemodialysis.   Dialysis received yesterday, UF 1.5L as tolerated. Next treatment scheduled for Friday  Acute versus subacute ischemic CVA anterior left temporal lobe status post tenecteplase  administration.  History of recurrent CVA. Loop recorder placed on 08/08/23. Prescribed aspirin  and Plavix  outpatient. CT head-likely acute or subacute nonhemorrhagic infarct in the anterior left temporal lobe measuring up to moderate confluent subcortical white matter hypoattenuation stable.  MRI brain- Acute nonhemorrhagic infarct in the posterior left frontal lobe involving the primary motor cortex, with associated T2/FLAIR hyperintensity. Confluent periventricular and subcortical T2 hyperintensities bilaterally, increased from the prior study. Remote infarcts in the anterior right insular cortex, right frontal lobe operculum, and lateral left temporal lobe are new since the prior exam. Neurology recommend restarting plavix  and aspirin .   3. Anemia of chronic kidney disease Hemoglobin & Hematocrit     Component Value Date/Time   HGB 10.6 (L) 08/19/2024 0800   HCT 32.7 (L) 08/19/2024 0800    Patient receives Mircera at outpatient clinic.  Hgb stable   4. Secondary Hyperparathyroidism: with outpatient labs: PTH 652, phosphorus 5.2, corr calcium  08/06/2024    Latest Reference Range & Units 08/17/24 08:03  Calcium  8.9 - 10.3 mg/dL 8.2 (L)  Phosphorus 2.5 - 4.6 mg/dL 5.3 (H)  (L): Data is abnormally low (H): Data is abnormally high  Patient prescribed calcitriol, Cinacalcet and calcium  acetate outpatient. Will continue to monitor bone minerals.  Continue Calcium  acetate with meals.  5.  Hypertension chronic kidney disease  Patient currently not on any antihypertensive medications. Blood pressure stable for this patient        LOS: 5 Deanna [name] 11/26/202510:57 AM

## 2024-08-20 NOTE — Progress Notes (Signed)
 Physical Therapy Treatment Patient Details Name: Deanna [name] MRN: [id number] DOB: May 05, 1951 Today's Date: 08/20/2024   History of Present Illness Deanna [name] is a 73 y.o.  female with a past medical history of ESRD on dialysis, coronary artery disease, CVA, status post PCI, CHF with reduced ejection fraction, diabetes mellitus, and hypertension. Patient presents to the ED from her dialysis center with acute onset of slurred speech and facial droop there. She was also experiencing worsened weakness/numbness of the right upper extremity relative to her baseline.    PT Comments  Patient received in bed. Initially refused therapy as she wanted to eat breakfast, now back in bed. Patient is agreeable to PT/OT session. She requires mod A for supine to sit. Assist for scooting hips to edge of bed. Stood at edge of bed with +2 mod/max assist briefly. Unable to get fully upright due to tightness in B Knees and hips in standing. Patient will continue to benefit from skilled PT to improve functional independence and safety.     If plan is discharge home, recommend the following: Two people to help with walking and/or transfers;A lot of help with bathing/dressing/bathroom   Can travel by private vehicle     No  Equipment Recommendations  None recommended by PT    Recommendations for Other Services       Precautions / Restrictions Precautions Precautions: Fall Recall of Precautions/Restrictions: Impaired Restrictions Weight Bearing Restrictions Per Provider Order: No     Mobility  Bed Mobility Overal bed mobility: Needs Assistance Bed Mobility: Supine to Sit, Sit to Supine     Supine to sit: Mod assist, Used rails, HOB elevated Sit to supine: Mod assist, +2 for physical assistance        Transfers Overall transfer level: Needs assistance Equipment used: 2 person hand held assist Transfers: Sit to/from Stand Sit to Stand: Mod assist, Max assist, +2 physical assistance            General transfer comment: poor standing tolerance and unable to get fully upright. Has B knee and hip flexion tightness    Ambulation/Gait               General Gait Details: unable   Stairs             Wheelchair Mobility     Tilt Bed    Modified Rankin (Stroke Patients Only)       Balance Overall balance assessment: Needs assistance Sitting-balance support: Feet supported Sitting balance-[name]: Fair     Standing balance support: Bilateral upper extremity supported, During functional activity Standing balance-[name]: Poor                              Communication Communication Communication: No apparent difficulties  Cognition Arousal: Alert Behavior During Therapy: WFL for tasks assessed/performed   PT - Cognitive impairments: Problem solving, Safety/Judgement                         Following commands: Intact      Cueing Cueing Techniques: Verbal cues, Gestural cues, Tactile cues, Visual cues  Exercises Other Exercises Other Exercises: LAQ seated edge of bed. B Knee extension stretching    General Comments        Pertinent Vitals/Pain Pain Assessment Pain Assessment: No/denies pain    Home Living  Prior Function            PT Goals (current goals can now be found in the care plan section) Acute Rehab PT Goals Patient Stated Goal: Pt wants to go home PT Goal Formulation: With patient Time For Goal Achievement: 09/07/24 Potential to Achieve Goals: Fair Progress towards PT goals: Progressing toward goals    Frequency    Min 2X/week      PT Plan      Co-evaluation PT/OT/SLP Co-Evaluation/Treatment: Yes Reason for Co-Treatment: To address functional/ADL transfers;Necessary to address cognition/behavior during functional activity;For patient/therapist safety PT goals addressed during session: Mobility/safety with mobility;Balance         AM-PAC PT 6 Clicks Mobility   Outcome Measure  Help needed turning from your back to your side while in a flat bed without using bedrails?: A Lot Help needed moving from lying on your back to sitting on the side of a flat bed without using bedrails?: A Lot Help needed moving to and from a bed to a chair (including a wheelchair)?: A Lot Help needed standing up from a chair using your arms (e.g., wheelchair or bedside chair)?: A Lot Help needed to walk in hospital room?: Total Help needed climbing 3-5 steps with a railing? : Total 6 Click Score: 10    End of Session Equipment Utilized During Treatment: Gait belt Activity Tolerance: Patient limited by fatigue Patient left: in bed;with call bell/phone within reach;with bed alarm set Nurse Communication: Mobility status PT Visit Diagnosis: Other abnormalities of gait and mobility (R26.89);Muscle weakness (generalized) (M62.81) Hemiplegia - Right/Left: Right Hemiplegia - dominant/non-dominant: Dominant     Time: 8990-8967 PT Time Calculation (min) (ACUTE ONLY): 23 min  Charges:    $Therapeutic Activity: 8-22 mins PT General Charges $$ ACUTE PT VISIT: 1 Visit                     Jaxyn Mestas, PT, GCS 08/20/24,11:16 AM

## 2024-08-20 NOTE — Plan of Care (Signed)
   Problem: Safety: Goal: Ability to remain free from injury will improve Outcome: Progressing   Problem: Skin Integrity: Goal: Risk for impaired skin integrity will decrease Outcome: Progressing

## 2024-08-21 LAB — GLUCOSE, CAPILLARY: Glucose-Capillary: 88 mg/dL (ref 70–99)

## 2024-08-21 NOTE — Plan of Care (Signed)

## 2024-08-21 NOTE — Progress Notes (Signed)
 PROGRESS NOTE    Deanna [name]   FMW:[id number] DOB: 1951-01-03  DOA: 08/15/2024 Date of Service: 08/21/24 which is hospital day 6  PCP: Brutus Delon SAUNDERS, NP    Hospital course / significant events:   Deanna [name] is a 73 y.o. female with medical history significant for CVA, ESRD on HD, extensive PAD s/p angioplasty and stent, history of HFrEF with recovered EF, hypertension, hyperlipidemia, type 2 diabetes, CAD, DISH arthritis, C3 anterior osteophyte fracture, was brought from the outpatient dialysis center to the ED because of right facial droop and slurred speech.  She also complained of numbness and weakness in the right upper extremity.   11/21:She was by the neurologist for acute stroke.  She was given TNK at 3:15 PM on 08/15/2024. Admitted to ICU  11/22: stable, neuro s/o  11/23: PT/OT recs for SNF 11/24 - 11/27: awaiting SNF placement. Pt was going to [facility] but see TOC note, they have rescinded their offer, we are now waiting to hear back from [employer]  Consultants:  Neurology  Procedures/Surgeries: none      ASSESSMENT & PLAN:   Acute left frontal lobe nonhemorrhagic stroke S/p tenecteplase  on 08/15/2024 at 3:15 PM. Follow-up MRI brain showed acute nonhemorrhagic infarct in the posterior left frontal lobe. Continue aspirin , Plavix  and Lipitor. Limited 2D echo view because patient was not cooperative. PT OT recommended discharge to SNF.  Follow-up with TOC to assist with disposition.   ESRD on HD Follow-up with nephrologist for hemodialysis.    Type II DM well controlled Glc have been appropriate, have dc Glc checks q4 and hold further insulin   Monitor periodic BMP / POC Glc prn suspect hypoglycemia   Comorbidities include hyperlipidemia, hypertension, PAD s/p stent, CAD, prior history of strokes, history of C3 anterior osteophyte fracture.   She is medically stable for discharge.  Awaiting placement to SNF.  Follow-up with TOC for  disposition.   Class 1 obesity based on BMI: Body mass index is 29.58 kg/m.SABRA Significantly low or high BMI is associated with higher medical risk.  Underweight - under 18  overweight - 25 to 29 obese - 30 or more Class 1 obesity: BMI of 30.0 to 34 Class 2 obesity: BMI of 35.0 to 39 Class 3 obesity: BMI of 40.0 to 49 Super Morbid Obesity: BMI 50-59 Super-super Morbid Obesity: BMI 60+ Healthy nutrition and physical activity advised as adjunct to other disease management and risk reduction treatments    DVT prophylaxis: heparin  IV fluids: no continuous IV fluids  Nutrition: cardiac diet  Central lines / other devices: none  Code Status: FULL CODE ACP documentation reviewed: Advanced directive is on file in [facility]  [facility] needs: placement Medical barriers to dispo: none.              Subjective / Brief ROS:  Patient resting  No concerns from RN    Family Communication: none at this time     Objective Findings:  Vitals:   08/20/24 2048 08/20/24 2341 08/21/24 0405 08/21/24 0740  BP: 104/61 134/69 (!) 116/59 122/74  Pulse: 99 91 92 95  Resp:    18  Temp: 97.6 F (36.4 C) (!) 96.9 F (36.1 C) 97.9 F (36.6 C) 98 F (36.7 C)  TempSrc:   Oral Oral  SpO2: 100% 100% 100% 100%  Weight:      Height:        Intake/Output Summary (Last 24 hours) at 08/21/2024 1451 Last data filed at 08/21/2024 0900 Gross per 24 hour  Intake 360 ml  Output --  Net 360 ml    Filed Weights   08/17/24 1141 08/19/24 0756 08/19/24 1145  Weight: 71.4 kg 72.5 kg 71 kg    Examination:  Physical Exam Constitutional:      General: She is not in acute distress. Pulmonary:     Effort: Pulmonary effort is normal.  Neurological:     Mental Status: Mental status is at baseline.          Scheduled Medications:   aspirin  EC  81 mg Oral Daily   atorvastatin   40 mg Oral Daily   calcium  acetate  667 mg Oral TID WC   Chlorhexidine  Gluconate Cloth  6 each Topical Q0600    clopidogrel   75 mg Oral Daily   heparin  injection (subcutaneous)  5,000 Units Subcutaneous Q8H   pantoprazole   40 mg Oral QHS   sertraline   100 mg Oral Daily   sodium chloride  flush  3 mL Intravenous Once    Continuous Infusions:   PRN Medications:  acetaminophen  **OR** acetaminophen  (TYLENOL ) oral liquid 160 mg/5 mL **OR** acetaminophen , ondansetron  (ZOFRAN ) IV, senna-docusate  Antimicrobials from admission:  Anti-infectives (From admission, onward)    None           Data Reviewed:  I have personally reviewed the following...  CBC: Recent Labs  Lab 08/15/24 1427 08/16/24 0548 08/17/24 0802 08/19/24 0800  WBC 7.9 7.7 7.7 9.0  NEUTROABS 5.5  --   --   --   HGB 11.2* 9.9* 10.0* 10.6*  HCT 35.2* 31.2* 31.4* 32.7*  MCV 86.3 86.9 86.0 84.1  PLT 186 189 191 230   Basic Metabolic Panel: Recent Labs  Lab 08/15/24 1427 08/16/24 0548 08/17/24 0803 08/19/24 0800  NA 139 137 135 130*  K 4.3 3.9 4.2 4.6  CL 96* 99 98 90*  CO2 29 26 24 23   GLUCOSE 98 78 85 112*  BUN 12 18 29* 33*  CREATININE 2.72* 3.65* 5.15* 5.33*  CALCIUM  8.8* 8.2* 8.2* 8.4*  PHOS  --  4.4 5.3* 6.4*   GFR: Estimated Creatinine Clearance: 8.6 mL/min (A) (by C-G formula based on SCr of 5.33 mg/dL (H)). Liver Function Tests: Recent Labs  Lab 08/15/24 1427 08/16/24 0548 08/17/24 0803 08/19/24 0800  AST 24  --   --   --   ALT 17  --   --   --   ALKPHOS 174*  --   --   --   BILITOT 0.4  --   --   --   PROT 8.5*  --   --   --   ALBUMIN  4.3 3.8 3.7 4.0   No results for input(s): LIPASE, AMYLASE in the last 168 hours. No results for input(s): AMMONIA in the last 168 hours. Coagulation Profile: Recent Labs  Lab 08/15/24 1427  INR 1.0   Cardiac Enzymes: No results for input(s): CKTOTAL, CKMB, CKMBINDEX, TROPONINI in the last 168 hours. BNP (last 3 results) No results for input(s): PROBNP in the last 8760 hours. HbA1C: No results for input(s): HGBA1C in the last 72  hours. CBG: Recent Labs  Lab 08/20/24 0400 08/20/24 0751 08/20/24 1226 08/20/24 1616 08/21/24 0757  GLUCAP 106* 105* 112* 117* 88   Lipid Profile: No results for input(s): CHOL, HDL, LDLCALC, TRIG, CHOLHDL, LDLDIRECT in the last 72 hours. Thyroid Function Tests: No results for input(s): TSH, T4TOTAL, FREET4, T3FREE, THYROIDAB in the last 72 hours. Anemia Panel: No results for input(s): VITAMINB12, FOLATE, FERRITIN, TIBC, IRON, RETICCTPCT  in the last 72 hours. Most Recent Urinalysis On File:     Component Value Date/Time   COLORURINE YELLOW (A) 12/26/2021 1621   APPEARANCEUR HAZY (A) 12/26/2021 1621   LABSPEC 1.011 12/26/2021 1621   PHURINE 5.0 12/26/2021 1621   GLUCOSEU 150 (A) 12/26/2021 1621   HGBUR SMALL (A) 12/26/2021 1621   BILIRUBINUR NEGATIVE 12/26/2021 1621   KETONESUR NEGATIVE 12/26/2021 1621   PROTEINUR 100 (A) 12/26/2021 1621   NITRITE NEGATIVE 12/26/2021 1621   LEUKOCYTESUR TRACE (A) 12/26/2021 1621   Sepsis Labs: @LABRCNTIP (procalcitonin:4,lacticidven:4) Microbiology: No results found for this or any previous visit (from the past 240 hours).    Radiology Studies last 3 days: No results found.         Johnay Mano, DO [facility] 08/21/2024, 2:51 PM    Dictation software may have been used to generate the above note. Typos may occur and escape review in typed/dictated notes. Please contact Dr Marsa directly for clarity if needed.  Staff may message me via secure chat in Epic  but this may not receive an immediate response,  please page me for urgent matters!  If 7PM-7AM, please contact night coverage [email or web address]

## 2024-08-21 NOTE — Progress Notes (Signed)
 [facility]  ROUNDING NOTE   Subjective:   Patient is known to our practice and receives outpatient dialysis treatments at [facility] on a MWF schedule, supervised by Dr. Marcelino.  Update: Patient seen and evaluated on dialysis. Verbal consent with son over the phone since patient cannot move right arm to sign. No issues with dialysis. Patient agitated with loss of function to right hand.  Update:  Patient sitting up in bed this a.m. No acute complaints. Due for dialysis treatment again tomorrow.   Objective:  Vital signs in last 24 hours:  Temp:  [96.9 F (36.1 C)-98 F (36.7 C)] 98 F (36.7 C) (11/27 0740) Pulse Rate:  [88-99] 95 (11/27 0740) Resp:  [18-19] 18 (11/27 0740) BP: (97-134)/(40-74) 122/74 (11/27 0740) SpO2:  [100 %] 100 % (11/27 0740)  Weight change:  Filed Weights   08/17/24 1141 08/19/24 0756 08/19/24 1145  Weight: 71.4 kg 72.5 kg 71 kg    Intake/Output: No intake/output data recorded.   Intake/Output this shift:  Total I/O In: 360 [P.O.:360] Out: -   Physical Exam: General: NAD  Head: Normocephalic  Lungs:  Clear, room air  Heart: Regular   Abdomen:  Soft,   Extremities: No peripheral edema.   Neurologic: Alert, awake  Skin: dry  Access: Rt chest Permcath    Basic Metabolic Panel: Recent Labs  Lab 08/15/24 1427 08/16/24 0548 08/17/24 0803 08/19/24 0800  NA 139 137 135 130*  K 4.3 3.9 4.2 4.6  CL 96* 99 98 90*  CO2 29 26 24 23   GLUCOSE 98 78 85 112*  BUN 12 18 29* 33*  CREATININE 2.72* 3.65* 5.15* 5.33*  CALCIUM  8.8* 8.2* 8.2* 8.4*  PHOS  --  4.4 5.3* 6.4*    Liver Function Tests: Recent Labs  Lab 08/15/24 1427 08/16/24 0548 08/17/24 0803 08/19/24 0800  AST 24  --   --   --   ALT 17  --   --   --   ALKPHOS 174*  --   --   --   BILITOT 0.4  --   --   --   PROT 8.5*  --   --   --   ALBUMIN  4.3 3.8 3.7 4.0   No results for input(s): LIPASE, AMYLASE in the last 168 hours. No results for input(s):  AMMONIA in the last 168 hours.  CBC: Recent Labs  Lab 08/15/24 1427 08/16/24 0548 08/17/24 0802 08/19/24 0800  WBC 7.9 7.7 7.7 9.0  NEUTROABS 5.5  --   --   --   HGB 11.2* 9.9* 10.0* 10.6*  HCT 35.2* 31.2* 31.4* 32.7*  MCV 86.3 86.9 86.0 84.1  PLT 186 189 191 230    Cardiac Enzymes: No results for input(s): CKTOTAL, CKMB, CKMBINDEX, TROPONINI in the last 168 hours.  BNP: Invalid input(s): POCBNP  CBG: Recent Labs  Lab 08/20/24 0400 08/20/24 0751 08/20/24 1226 08/20/24 1616 08/21/24 0757  GLUCAP 106* 105* 112* 117* 88    Microbiology: Results for orders placed or performed during the hospital encounter of 01/20/22  Blood culture (routine x 2)     Status: None   Collection Time: 01/20/22  7:22 PM   Specimen: BLOOD  Result Value Ref Range Status   Specimen Description BLOOD BLOOD RIGHT HAND  Final   Special Requests   Final    BOTTLES DRAWN AEROBIC AND ANAEROBIC Blood Culture results may not be optimal due to an inadequate volume of blood received in culture bottles  Culture   Final    NO GROWTH 5 DAYS Performed at [facility], [address] [place], [place] [address]    Report Status 01/25/2022 FINAL  Final  Blood culture (routine x 2)     Status: None   Collection Time: 01/20/22  7:22 PM   Specimen: BLOOD  Result Value Ref Range Status   Specimen Description BLOOD BLOOD LEFT HAND  Final   Special Requests   Final    BOTTLES DRAWN AEROBIC AND ANAEROBIC Blood Culture results may not be optimal due to an inadequate volume of blood received in culture bottles   Culture   Final    NO GROWTH 5 DAYS Performed at [facility], [address] [place], [place] [address]    Report Status 01/25/2022 FINAL  Final  Resp Panel by RT-PCR (Flu A&B, Covid) Nasopharyngeal Swab     Status: None   Collection Time: 01/20/22  7:28 PM   Specimen: Nasopharyngeal Swab; Nasopharyngeal(NP) swabs in vial transport medium  Result Value Ref Range  Status   SARS Coronavirus 2 by RT PCR NEGATIVE NEGATIVE Final    Comment: (NOTE) SARS-CoV-2 target nucleic acids are NOT DETECTED.  The SARS-CoV-2 RNA is generally detectable in upper respiratory specimens during the acute phase of infection. The lowest concentration of SARS-CoV-2 viral copies this assay can detect is 138 copies/mL. A negative result does not preclude SARS-Cov-2 infection and should not be used as the sole basis for treatment or other patient management decisions. A negative result may occur with  improper specimen collection/handling, submission of specimen other than nasopharyngeal swab, presence of viral mutation(s) within the areas targeted by this assay, and inadequate number of viral copies(<138 copies/mL). A negative result must be combined with clinical observations, patient history, and epidemiological information. The expected result is Negative.  Fact Sheet for Patients:  bloggercourse.com  Fact Sheet for Healthcare Providers:  seriousbroker.it  This test is no t yet approved or cleared by the [place]  FDA and  has been authorized for detection and/or diagnosis of SARS-CoV-2 by FDA under an Emergency Use Authorization (EUA). This EUA will remain  in effect (meaning this test can be used) for the duration of the COVID-19 declaration under Section 564(b)(1) of the Act, 21 U.S.C.section 360bbb-3(b)(1), unless the authorization is terminated  or revoked sooner.       Influenza A by PCR NEGATIVE NEGATIVE Final   Influenza B by PCR NEGATIVE NEGATIVE Final    Comment: (NOTE) The Xpert Xpress SARS-CoV-2/FLU/RSV plus assay is intended as an aid in the diagnosis of influenza from Nasopharyngeal swab specimens and should not be used as a sole basis for treatment. Nasal washings and aspirates are unacceptable for Xpert Xpress SARS-CoV-2/FLU/RSV testing.  Fact Sheet for  Patients: bloggercourse.com  Fact Sheet for Healthcare Providers: seriousbroker.it  This test is not yet approved or cleared by the [place]  FDA and has been authorized for detection and/or diagnosis of SARS-CoV-2 by FDA under an Emergency Use Authorization (EUA). This EUA will remain in effect (meaning this test can be used) for the duration of the COVID-19 declaration under Section 564(b)(1) of the Act, 21 U.S.C. section 360bbb-3(b)(1), unless the authorization is terminated or revoked.  Performed at [facility], [address] [place], [place] [address]     Coagulation Studies: No results for input(s): LABPROT, INR in the last 72 hours.   Urinalysis: No results for input(s): COLORURINE, LABSPEC, PHURINE, GLUCOSEU, HGBUR, BILIRUBINUR, KETONESUR, PROTEINUR, UROBILINOGEN, NITRITE, LEUKOCYTESUR in  the last 72 hours.  Invalid input(s): APPERANCEUR    Imaging: No results found.    Medications:     aspirin  EC  81 mg Oral Daily   atorvastatin   40 mg Oral Daily   calcium  acetate  667 mg Oral TID WC   Chlorhexidine  Gluconate Cloth  6 each Topical Q0600   clopidogrel   75 mg Oral Daily   heparin  injection (subcutaneous)  5,000 Units Subcutaneous Q8H   pantoprazole   40 mg Oral QHS   sertraline   100 mg Oral Daily   sodium chloride  flush  3 mL Intravenous Once   acetaminophen  **OR** acetaminophen  (TYLENOL ) oral liquid 160 mg/5 mL **OR** acetaminophen , ondansetron  (ZOFRAN ) IV, senna-docusate  Assessment/ Plan:  [name] is a 73 y.o.  female with a past medical history of ESRD on dialysis, CVA, coronary artery disease, status post PCI, CHF with reduced ejection fraction, diabetes mellitus, and hypertension, Patient presents to the ED from her dialysis center with acute onset of slurred speech and facial droop there. She was also experiencing worsened weakness/numbness of the  right upper extremity relative to her baseline. Code Stroke was called.    [facility] /MWF/Rt PC/74.0 kg   End-stage renal disease on hemodialysis.   We will schedule normal hemodialysis treatment tomorrow.  Acute versus subacute ischemic CVA anterior left temporal lobe status post tenecteplase  administration.  History of recurrent CVA. Loop recorder placed on 08/08/23. Prescribed aspirin  and Plavix  outpatient. CT head-likely acute or subacute nonhemorrhagic infarct in the anterior left temporal lobe measuring up to moderate confluent subcortical white matter hypoattenuation stable.  MRI brain- Acute nonhemorrhagic infarct in the posterior left frontal lobe involving the primary motor cortex, with associated T2/FLAIR hyperintensity. Confluent periventricular and subcortical T2 hyperintensities bilaterally, increased from the prior study. Remote infarcts in the anterior right insular cortex, right frontal lobe operculum, and lateral left temporal lobe are new since the prior exam. Neurology recommend restarting plavix  and aspirin .   3. Anemia of chronic kidney disease Hemoglobin & Hematocrit     Component Value Date/Time   HGB 10.6 (L) 08/19/2024 0800   HCT 32.7 (L) 08/19/2024 0800    Patient receives Mircera at outpatient clinic.  Hemoglobin acceptable at 10.6.   4. Secondary Hyperparathyroidism: with outpatient labs: PTH 652, phosphorus 5.2, corr calcium  08/06/2024.  Patient prescribed calcitriol, Cinacalcet and calcium  acetate outpatient.  Update: Monitor bone metabolism parameters over the course of the hospitalization.  5.  Hypertension chronic kidney disease  Patient currently not on any antihypertensive medications. Blood pressure stable for this patient       LOS: 6 Jessiah Wojnar 11/27/202510:48 AM

## 2024-08-22 LAB — RENAL FUNCTION PANEL
Albumin: 4 g/dL (ref 3.5–5.0)
Anion gap: 18 — ABNORMAL HIGH (ref 5–15)
BUN: 38 mg/dL — ABNORMAL HIGH (ref 8–23)
CO2: 23 mmol/L (ref 22–32)
Calcium: 8.4 mg/dL — ABNORMAL LOW (ref 8.9–10.3)
Chloride: 87 mmol/L — ABNORMAL LOW (ref 98–111)
Creatinine, Ser: 6.79 mg/dL — ABNORMAL HIGH (ref 0.44–1.00)
GFR, Estimated: 6 mL/min — ABNORMAL LOW (ref >60)
Glucose, Bld: 79 mg/dL (ref 70–99)
Phosphorus: 5.7 mg/dL — ABNORMAL HIGH (ref 2.5–4.6)
Potassium: 4.5 mmol/L (ref 3.5–5.1)
Sodium: 128 mmol/L — ABNORMAL LOW (ref 135–145)

## 2024-08-22 LAB — GLUCOSE, CAPILLARY
Glucose-Capillary: 83 mg/dL (ref 70–99)
Glucose-Capillary: 103 mg/dL — ABNORMAL HIGH (ref 70–99)

## 2024-08-22 LAB — CBC WITH DIFFERENTIAL/PLATELET
Abs Immature Granulocytes: 0.07 K/uL (ref 0.00–0.07)
Basophils Absolute: 0.1 K/uL (ref 0.0–0.1)
Basophils Relative: 1 %
Eosinophils Absolute: 0.1 K/uL (ref 0.0–0.5)
Eosinophils Relative: 1 %
HCT: 32.8 % — ABNORMAL LOW (ref 36.0–46.0)
Hemoglobin: 10.7 g/dL — ABNORMAL LOW (ref 12.0–15.0)
Immature Granulocytes: 1 %
Lymphocytes Relative: 17 %
Lymphs Abs: 1.3 K/uL (ref 0.7–4.0)
MCH: 27.4 pg (ref 26.0–34.0)
MCHC: 32.6 g/dL (ref 30.0–36.0)
MCV: 84.1 fL (ref 80.0–100.0)
Monocytes Absolute: 0.8 K/uL (ref 0.1–1.0)
Monocytes Relative: 10 %
Neutro Abs: 5.7 K/uL (ref 1.7–7.7)
Neutrophils Relative %: 70 %
Platelets: 300 K/uL (ref 150–400)
RBC: 3.9 MIL/uL (ref 3.87–5.11)
RDW: 16.5 % — ABNORMAL HIGH (ref 11.5–15.5)
WBC: 8.1 K/uL (ref 4.0–10.5)
nRBC: 0 % (ref 0.0–0.2)

## 2024-08-22 MED ORDER — HEPARIN SODIUM (PORCINE) 1000 UNIT/ML IJ SOLN
INTRAMUSCULAR | Status: AC
Start: 2024-08-22 — End: 2024-08-22
  Filled 2024-08-22: qty 4

## 2024-08-22 MED ORDER — HEPARIN SODIUM (PORCINE) 1000 UNIT/ML IJ SOLN
3200.0000 [IU] | Freq: Once | INTRAMUSCULAR | Status: AC
  Administered 2024-08-22: 3200 [IU]

## 2024-08-22 NOTE — Progress Notes (Signed)
 Mobility Specialist - Progress Note    08/22/24 1329  Mobility  Activity Turned to right side;Turned to left side;Dangled on edge of bed;Pivoted/transferred from bed to chair  Level of Assistance +2 (takes two people)  Assistive Device None  Range of Motion/Exercises Active  Activity Response Tolerated well  Mobility Referral Yes  Mobility visit 1 Mobility   Pt resting EOB upon entry on RA. Pt STS MaxA +2 quick transfer to recliner (steady recommended for safety) with no AD. Pt left in recliner with needs in reach and chair alarm activated.   Guido Rumble Mobility Specialist 08/22/24, 2:12 PM

## 2024-08-22 NOTE — Progress Notes (Signed)
 Writer attempted to turn the patient from the left side to the right side to prevent pressure ulcers. Patient refused to be turned. Patient also has been provided education regarding the importance of turning. Patient has refused all efforts to turn. Patient's head is compressed slightly against the side rail, but has refused to be turned.

## 2024-08-22 NOTE — Plan of Care (Signed)
  Problem: Ischemic Stroke/TIA Tissue Perfusion: Goal: Complications of ischemic stroke/TIA will be minimized Outcome: Progressing   Problem: Coping: Goal: Will verbalize positive feelings about self Outcome: Progressing   Problem: Metabolic: Goal: Ability to maintain appropriate glucose levels will improve Outcome: Progressing   Problem: Tissue Perfusion: Goal: Adequacy of tissue perfusion will improve Outcome: Progressing

## 2024-08-22 NOTE — Progress Notes (Signed)
 PT Cancellation Note  Patient Details Name: Deanna [name] MRN: [id number] DOB: Dec 30, 1950   Cancelled Treatment:    Reason Eval/Treat Not Completed: Patient at procedure or test/unavailable Patient is off unit for HD. Will re-attempt at later time/date.  Garet Hooton 08/22/2024, 8:07 AM

## 2024-08-22 NOTE — Progress Notes (Signed)
 OT Cancellation Note  Patient Details Name: Deanna [name] MRN: [id number] DOB: 1951/01/30   Cancelled Treatment:    Reason Eval/Treat Not Completed: Patient at procedure or test/ unavailable. Pt off the unit for HD, OT will continue to follow and see as able/appropriate.   Eythan Jayne L. Maheen Cwikla, OTR/L  08/22/24, 9:19 AM

## 2024-08-22 NOTE — Progress Notes (Signed)
 Occupational Therapy Treatment Patient Details Name: Deanna [name] MRN: [id number] DOB: May 27, 1951 Today's Date: 08/22/2024   History of present illness Deanna [name] is a 73 y.o. female with a past medical history of ESRD on dialysis, CAD, CVA, status post PCI, CHF with reduced ejection fraction, DM, HTN. Patient presents to the ED from her dialysis center with acute onset of slurred speech and facial droop there. She was also experiencing worsened weakness/numbness of the right upper extremity relative to her baseline. Found to have acute left frontal lobe nonhemorrhagic stroke: S/p tenecteplase  on 08/15/2024. Follow-up MRI brain showed acute nonhemorrhagic infarct in the posterior left frontal lobe.   OT comments  Pt seen for OT treatment. Continues with flat affect and minimal engagement overall, often does not respond to edu and closes eyes. Requires +2 for bed mobility largely due to non-functional use of dominant RUE and stiffness at bilateral hips/knees. Sits at EOB for neuromuscular re-education of hemiparetic UE to promote activation for functional reach and ADL use. Pt performs proximal scapular activation for improved upright posture, constant cues for reinforcement, and participates in RUE activation with PROM as needed from OT. Pt demonstrates improved activation with bicep and is able to flex/extend elbow with cues, noted tone distally in digits with functional reach tasks. Anticipate pt will continue to require +2 for all OOB mobility efforts and benefit from reinforcement re: shoulder positioning to prevent subluxation. OT will follow acutely. Discharge recommendation appropriate.       If plan is discharge home, recommend the following:  Assist for transportation;Direct supervision/assist for financial management;Direct supervision/assist for medications management;Assistance with cooking/housework;Two people to help with walking and/or transfers;Two people to help with  bathing/dressing/bathroom   Equipment Recommendations  Other (comment)       Precautions / Restrictions Precautions Precautions: Fall Recall of Precautions/Restrictions: Impaired Restrictions Weight Bearing Restrictions Per Provider Order: No       Mobility Bed Mobility Overal bed mobility: Needs Assistance Bed Mobility: Supine to Sit, Sit to Supine     Supine to sit: Mod assist, HOB elevated, Used rails, +2 for physical assistance Sit to supine: Total assist, HOB elevated, Used rails, +2 for safety/equipment, +2 for physical assistance   General bed mobility comments: greatly limited by stiffness at hip/knee/forward trunk flexion. requires +2 for all bed mobility due to limited pt participation    Transfers Overall transfer level: Needs assistance                 General transfer comment: NT this session. Anticipate +2 continued     Balance Overall balance assessment: Needs assistance Sitting-balance support: Feet supported Sitting balance-[name]: Fair Sitting balance - Comments: not able to use RUE to supports                                   ADL either performed or assessed with clinical judgement   ADL Overall ADL's : Needs assistance/impaired                                       General ADL Comments: Pt refuses ADL performance and requests to focus on hemiparetic UE    Extremity/Trunk Assessment Upper Extremity Assessment RUE Deficits / Details: pt has scapular elevation/depression/protraction/retraction. no active grip, able to supinate but limited pronation, improved activation at tricep and noted bicep activation with  elbow flexion                     Communication Communication Communication: No apparent difficulties   Cognition Arousal: Alert Behavior During Therapy: Flat affect Cognition: No family/caregiver present to determine baseline                               Following  commands: Intact        Cueing   Cueing Techniques: Verbal cues, Gestural cues, Tactile cues, Visual cues  Exercises Exercises: Other exercises, General Upper Extremity General Exercises - Upper Extremity Elbow Flexion: PROM, AROM, Strengthening, Right, Seated, 15 reps Elbow Extension: 15 reps, Seated, AROM, PROM Wrist Flexion: 10 reps, PROM, AROM, Seated Wrist Extension: 10 reps, Seated, Right, AROM, PROM Other Exercises Other Exercises: scapular protraction/retraction x10 to improve proximal stability, pt with forward flexed posture and rounded shoulders. limited carryover to upright despite multiple cuing attempts Other Exercises: seated UE neuromuscular activation of hemiparetic side. pt performs table slides with tissue x10, reaching hemiparetic UE forward. noted increase in tone of digits distally, requires support at elbow to prevent excessive adduction of elbow Other Exercises: mild resistive isometric push-pull seated for improved reciprical tricep-bicep activation x 15 Other Exercises: edu on stroke recovery process            Pertinent Vitals/ Pain       Pain Assessment Pain Assessment: Faces Faces Pain Scale: Hurts little more Pain Location: L hip, both knees Pain Descriptors / Indicators: Grimacing, Guarding, Discomfort Pain Intervention(s): Monitored during session, Limited activity within patient's tolerance, Repositioned   Frequency  Min 2X/week        Progress Toward Goals  OT Goals(current goals can now be found in the care plan section)  Progress towards OT goals: Progressing toward goals  Acute Rehab OT Goals OT Goal Formulation: With patient Time For Goal Achievement: 08/31/24 Potential to Achieve Goals: Good ADL Goals Pt Will Perform Grooming: sitting;with set-up Pt Will Perform Lower Body Dressing: sitting/lateral leans;with min assist Pt Will Transfer to Toilet: ambulating;with mod assist Pt Will Perform Toileting - Clothing Manipulation and  hygiene: sit to/from stand;with mod assist  Plan         AM-PAC OT 6 Clicks Daily Activity     Outcome Measure   Help from another person eating meals?: A Little Help from another person taking care of personal grooming?: A Lot Help from another person toileting, which includes using toliet, bedpan, or urinal?: A Lot Help from another person bathing (including washing, rinsing, drying)?: A Lot Help from another person to put on and taking off regular upper body clothing?: A Little Help from another person to put on and taking off regular lower body clothing?: A Lot 6 Click Score: 14    End of Session    OT Visit Diagnosis: Unsteadiness on feet (R26.81);Other abnormalities of gait and mobility (R26.89);Repeated falls (R29.6);Muscle weakness (generalized) (M62.81);Hemiplegia and hemiparesis Hemiplegia - Right/Left: Right Hemiplegia - dominant/non-dominant: Dominant Hemiplegia - caused by: Cerebral infarction   Activity Tolerance Patient tolerated treatment well   Patient Left in bed;with call bell/phone within reach;with bed alarm set   Nurse Communication Mobility status        Time: 8477-8445 OT Time Calculation (min): 32 min  Charges: OT General Charges $OT Visit: 1 Visit OT Treatments $Neuromuscular Re-education: 23-37 mins  Aryaan Persichetti L. Jabori Henegar, OTR/L  08/22/24, 4:59 PM

## 2024-08-22 NOTE — Progress Notes (Signed)
 [facility]  ROUNDING NOTE   Subjective:   Patient is known to our practice and receives outpatient dialysis treatments at [facility] on a MWF schedule, supervised by Dr. Marcelino.  Update: Patient seen and evaluated on dialysis. Verbal consent with son over the phone since patient cannot move right arm to sign. No issues with dialysis. Patient agitated with loss of function to right hand.  Update:  Patient seen and evaluated during dialysis   HEMODIALYSIS FLOWSHEET:  Blood Flow Rate (mL/min): 400 mL/min Arterial Pressure (mmHg): -190.09 mmHg Venous Pressure (mmHg): 165.85 mmHg TMP (mmHg): 7.47 mmHg Ultrafiltration Rate (mL/min): 686 mL/min Dialysate Flow Rate (mL/min): 300 ml/min Dialysis Fluid Bolus: Normal Saline Bolus Amount (mL): 100 mL  Resting comfortably during dialysis   Objective:  Vital signs in last 24 hours:  Temp:  [97.4 F (36.3 C)-98 F (36.7 C)] 97.6 F (36.4 C) (11/28 0730) Pulse Rate:  [79-103] 96 (11/28 0930) Resp:  [16-18] 16 (11/28 0358) BP: (114-138)/(52-66) 133/66 (11/28 0930) SpO2:  [92 %-100 %] 96 % (11/28 0930) Weight:  [74.4 kg] 74.4 kg (11/28 0730)  Weight change:  Filed Weights   08/19/24 0756 08/19/24 1145 08/22/24 0730  Weight: 72.5 kg 71 kg 74.4 kg    Intake/Output: I/O last 3 completed shifts: In: 360 [P.O.:360] Out: -    Intake/Output this shift:  No intake/output data recorded.  Physical Exam: General: NAD  Head: Normocephalic  Lungs:  Clear, room air  Heart: Regular   Abdomen:  Soft,   Extremities: No peripheral edema.   Neurologic: Alert, awake  Skin: dry  Access: Rt chest Permcath    Basic Metabolic Panel: Recent Labs  Lab 08/15/24 1427 08/16/24 0548 08/17/24 0803 08/19/24 0800 08/22/24 0800  NA 139 137 135 130* 128*  K 4.3 3.9 4.2 4.6 4.5  CL 96* 99 98 90* 87*  CO2 29 26 24 23 23   GLUCOSE 98 78 85 112* 79  BUN 12 18 29* 33* 38*  CREATININE 2.72* 3.65* 5.15* 5.33* 6.79*  CALCIUM  8.8*  8.2* 8.2* 8.4* 8.4*  PHOS  --  4.4 5.3* 6.4* 5.7*    Liver Function Tests: Recent Labs  Lab 08/15/24 1427 08/16/24 0548 08/17/24 0803 08/19/24 0800 08/22/24 0800  AST 24  --   --   --   --   ALT 17  --   --   --   --   ALKPHOS 174*  --   --   --   --   BILITOT 0.4  --   --   --   --   PROT 8.5*  --   --   --   --   ALBUMIN  4.3 3.8 3.7 4.0 4.0   No results for input(s): LIPASE, AMYLASE in the last 168 hours. No results for input(s): AMMONIA in the last 168 hours.  CBC: Recent Labs  Lab 08/15/24 1427 08/16/24 0548 08/17/24 0802 08/19/24 0800 08/22/24 0800  WBC 7.9 7.7 7.7 9.0 8.1  NEUTROABS 5.5  --   --   --  5.7  HGB 11.2* 9.9* 10.0* 10.6* 10.7*  HCT 35.2* 31.2* 31.4* 32.7* 32.8*  MCV 86.3 86.9 86.0 84.1 84.1  PLT 186 189 191 230 300    Cardiac Enzymes: No results for input(s): CKTOTAL, CKMB, CKMBINDEX, TROPONINI in the last 168 hours.  BNP: Invalid input(s): POCBNP  CBG: Recent Labs  Lab 08/20/24 0400 08/20/24 0751 08/20/24 1226 08/20/24 1616 08/21/24 0757  GLUCAP 106* 105* 112* 117* 88  Microbiology: Results for orders placed or performed during the hospital encounter of 01/20/22  Blood culture (routine x 2)     Status: None   Collection Time: 01/20/22  7:22 PM   Specimen: BLOOD  Result Value Ref Range Status   Specimen Description BLOOD BLOOD RIGHT HAND  Final   Special Requests   Final    BOTTLES DRAWN AEROBIC AND ANAEROBIC Blood Culture results may not be optimal due to an inadequate volume of blood received in culture bottles   Culture   Final    NO GROWTH 5 DAYS Performed at [facility], [address] [place], [place] [address]    Report Status 01/25/2022 FINAL  Final  Blood culture (routine x 2)     Status: None   Collection Time: 01/20/22  7:22 PM   Specimen: BLOOD  Result Value Ref Range Status   Specimen Description BLOOD BLOOD LEFT HAND  Final   Special Requests   Final    BOTTLES DRAWN AEROBIC AND  ANAEROBIC Blood Culture results may not be optimal due to an inadequate volume of blood received in culture bottles   Culture   Final    NO GROWTH 5 DAYS Performed at [facility], [address] [place], [place] [address]    Report Status 01/25/2022 FINAL  Final  Resp Panel by RT-PCR (Flu A&B, Covid) Nasopharyngeal Swab     Status: None   Collection Time: 01/20/22  7:28 PM   Specimen: Nasopharyngeal Swab; Nasopharyngeal(NP) swabs in vial transport medium  Result Value Ref Range Status   SARS Coronavirus 2 by RT PCR NEGATIVE NEGATIVE Final    Comment: (NOTE) SARS-CoV-2 target nucleic acids are NOT DETECTED.  The SARS-CoV-2 RNA is generally detectable in upper respiratory specimens during the acute phase of infection. The lowest concentration of SARS-CoV-2 viral copies this assay can detect is 138 copies/mL. A negative result does not preclude SARS-Cov-2 infection and should not be used as the sole basis for treatment or other patient management decisions. A negative result may occur with  improper specimen collection/handling, submission of specimen other than nasopharyngeal swab, presence of viral mutation(s) within the areas targeted by this assay, and inadequate number of viral copies(<138 copies/mL). A negative result must be combined with clinical observations, patient history, and epidemiological information. The expected result is Negative.  Fact Sheet for Patients:  bloggercourse.com  Fact Sheet for Healthcare Providers:  seriousbroker.it  This test is no t yet approved or cleared by the [place]  FDA and  has been authorized for detection and/or diagnosis of SARS-CoV-2 by FDA under an Emergency Use Authorization (EUA). This EUA will remain  in effect (meaning this test can be used) for the duration of the COVID-19 declaration under Section 564(b)(1) of the Act, 21 U.S.C.section 360bbb-3(b)(1), unless the  authorization is terminated  or revoked sooner.       Influenza A by PCR NEGATIVE NEGATIVE Final   Influenza B by PCR NEGATIVE NEGATIVE Final    Comment: (NOTE) The Xpert Xpress SARS-CoV-2/FLU/RSV plus assay is intended as an aid in the diagnosis of influenza from Nasopharyngeal swab specimens and should not be used as a sole basis for treatment. Nasal washings and aspirates are unacceptable for Xpert Xpress SARS-CoV-2/FLU/RSV testing.  Fact Sheet for Patients: bloggercourse.com  Fact Sheet for Healthcare Providers: seriousbroker.it  This test is not yet approved or cleared by the [place]  FDA and has been authorized for detection and/or diagnosis of SARS-CoV-2 by FDA under an Emergency  Use Authorization (EUA). This EUA will remain in effect (meaning this test can be used) for the duration of the COVID-19 declaration under Section 564(b)(1) of the Act, 21 U.S.C. section 360bbb-3(b)(1), unless the authorization is terminated or revoked.  Performed at [facility], [address] [place], [place] [address]     Coagulation Studies: No results for input(s): LABPROT, INR in the last 72 hours.   Urinalysis: No results for input(s): COLORURINE, LABSPEC, PHURINE, GLUCOSEU, HGBUR, BILIRUBINUR, KETONESUR, PROTEINUR, UROBILINOGEN, NITRITE, LEUKOCYTESUR in the last 72 hours.  Invalid input(s): APPERANCEUR    Imaging: No results found.    Medications:     aspirin  EC  81 mg Oral Daily   atorvastatin   40 mg Oral Daily   calcium  acetate  667 mg Oral TID WC   Chlorhexidine  Gluconate Cloth  6 each Topical Q0600   clopidogrel   75 mg Oral Daily   heparin  injection (subcutaneous)  5,000 Units Subcutaneous Q8H   pantoprazole   40 mg Oral QHS   sertraline   100 mg Oral Daily   sodium chloride  flush  3 mL Intravenous Once   acetaminophen  **OR** acetaminophen  (TYLENOL ) oral liquid 160 mg/5  mL **OR** acetaminophen , ondansetron  (ZOFRAN ) IV, senna-docusate  Assessment/ Plan:  [name] is a 73 y.o.  female with a past medical history of ESRD on dialysis, CVA, coronary artery disease, status post PCI, CHF with reduced ejection fraction, diabetes mellitus, and hypertension, Patient presents to the ED from her dialysis center with acute onset of slurred speech and facial droop there. She was also experiencing worsened weakness/numbness of the right upper extremity relative to her baseline. Code Stroke was called.    [facility]/MWF/Rt PC/74.0 kg   End-stage renal disease on hemodialysis.   Receiving dialysis today, UF 1-1.5L as tolerated. Next treatment scheduled for Monday  Acute versus subacute ischemic CVA anterior left temporal lobe status post tenecteplase  administration.  History of recurrent CVA. Loop recorder placed on 08/08/23. Prescribed aspirin  and Plavix  outpatient. CT head-likely acute or subacute nonhemorrhagic infarct in the anterior left temporal lobe measuring up to moderate confluent subcortical white matter hypoattenuation stable.  MRI brain- Acute nonhemorrhagic infarct in the posterior left frontal lobe involving the primary motor cortex, with associated T2/FLAIR hyperintensity. Confluent periventricular and subcortical T2 hyperintensities bilaterally, increased from the prior study. Remote infarcts in the anterior right insular cortex, right frontal lobe operculum, and lateral left temporal lobe are new since the prior exam. Plavix  and aspirin  continued per neurology.   3. Anemia of chronic kidney disease Hemoglobin & Hematocrit     Component Value Date/Time   HGB 10.7 (L) 08/22/2024 0800   HCT 32.8 (L) 08/22/2024 0800    Patient receives Mircera at outpatient clinic.  Hemoglobin 10.7, stable.   4. Secondary Hyperparathyroidism: with outpatient labs: PTH 652, phosphorus 5.2, corr calcium  08/06/2024.  Patient prescribed calcitriol,  Cinacalcet and calcium  acetate outpatient.  Update: Phosphorus slightly elevated. Will continue to monitor and assess need for binders.   5.  Hypertension chronic kidney disease  Patient currently not on any antihypertensive medications. Blood pressure 114/63 during dialysis       LOS: 7 Camber Ninh 11/28/20259:49 AM

## 2024-08-22 NOTE — Progress Notes (Signed)
   08/22/24 1149  Vitals  BP 125/64  Pulse Rate 97  ECG Heart Rate (!) 104  Weight 73.4 kg  Type of Weight Post-Dialysis  Oxygen Therapy  SpO2 99 %  O2 Device Room Air  Patient Activity (if Appropriate) In bed  Pulse Oximetry Type Continuous  Oximetry Probe Site Changed No  During Treatment Monitoring  Blood Flow Rate (mL/min) 0 mL/min  Arterial Pressure (mmHg) -2.83 mmHg  Venous Pressure (mmHg) -2.02 mmHg  TMP (mmHg) 12.52 mmHg  Ultrafiltration Rate (mL/min) 471 mL/min  Dialysate Flow Rate (mL/min) 299 ml/min  Dialysate Potassium Concentration 3  Dialysate Calcium  Concentration 2.5  Duration of HD Treatment -hour(s) 3.5 hour(s)  Cumulative Fluid Removed (mL) per Treatment  1400.07  Post Treatment  Dialyzer Clearance Clear  Hemodialysis Intake (mL) 0 mL  Liters Processed 84  Fluid Removed (mL) 1400 mL  Tolerated HD Treatment Yes  Post-Hemodialysis Comments 1400 Removed. Tx has been tolerated. Vs have remained stable. Access is clean, dry and intact. No verbalized concerns. Stable to return to assigned room

## 2024-08-22 NOTE — TOC Progression Note (Signed)
 Transition of Care [facility]) - Progression Note    Patient Details  Name: Deanna [name] MRN: [id number] Date of Birth: April 26, 1951  Transition of Care [facility]) CM/SW Contact  Daved JONETTA Hamilton, RN Phone Number: 08/22/2024, 5:04 PM  Clinical Narrative:     Met with patient to discuss no bed offers received and [facility] is considering however they do not anticipate having a bed available until early next week. Discussed with patient option of extending the search area, patient verbalized agreement for TOC to include [place].  Search area extended to [place], [place] [facility] facilities.    Expected Discharge Plan: Skilled Nursing Facility Barriers to Discharge: Insurance Authorization               Expected Discharge Plan and Services   Discharge Planning Services: CM Consult Post Acute Care Choice: Skilled Nursing Facility Living arrangements for the past 2 months: Assisted Living Facility Expected Discharge Date: 08/18/24                                     Social Drivers of Health (SDOH) Interventions SDOH Screenings   Food Insecurity: Patient Declined (08/17/2024)  Housing: Unknown (08/17/2024)  Transportation Needs: Patient Declined (08/17/2024)  Utilities: Patient Declined (08/17/2024)  Depression (PHQ2-9): Low Risk  (06/12/2024)  Financial Resource Strain: Low Risk  (07/22/2024)   Received from [facility]  Social Connections: Patient Declined (08/17/2024)  Tobacco Use: Low Risk  (08/15/2024)    Readmission Risk Interventions    01/16/2022    3:45 PM 12/29/2021    2:33 PM  Readmission Risk Prevention Plan  Transportation Screening Complete Complete  PCP or Specialist Appt within 5-7 Days  Complete  Home Care Screening  Complete  Medication Review (RN CM)  Complete  Medication Review (RN Care Manager) Complete   PCP or Specialist appointment within 3-5 days of discharge Complete   HRI or Home Care Consult Complete   SW  Recovery Care/Counseling Consult Complete   Palliative Care Screening Not Applicable   Skilled Nursing Facility Not Applicable

## 2024-08-22 NOTE — Progress Notes (Signed)
 PROGRESS NOTE    Deanna [name]   FMW:[id number] DOB: 11-21-1950  DOA: 08/15/2024 Date of Service: 08/22/24 which is hospital day 7  PCP: Brutus Delon SAUNDERS, NP    Hospital course / significant events:   Deanna [name] is a 73 y.o. female with medical history significant for CVA, ESRD on HD, extensive PAD s/p angioplasty and stent, history of HFrEF with recovered EF, hypertension, hyperlipidemia, type 2 diabetes, CAD, DISH arthritis, C3 anterior osteophyte fracture, was brought from the outpatient dialysis center to the ED because of right facial droop and slurred speech.  She also complained of numbness and weakness in the right upper extremity.   11/21:She was by the neurologist for acute stroke.  She was given TNK at 3:15 PM on 08/15/2024. Admitted to ICU  11/22: stable, neuro s/o  11/23: PT/OT recs for SNF 11/24 - 11/28: awaiting SNF placement. Pt was going to [facility] but see TOC note, they have rescinded their offer, we are now waiting to hear back from [employer]  Consultants:  Neurology  Procedures/Surgeries: none      ASSESSMENT & PLAN:   Acute left frontal lobe nonhemorrhagic stroke S/p tenecteplase  on 08/15/2024 at 3:15 PM. Follow-up MRI brain showed acute nonhemorrhagic infarct in the posterior left frontal lobe. Continue aspirin , Plavix  and Lipitor. Limited 2D echo view because patient was not cooperative. PT OT recommended discharge to SNF.  Follow-up with TOC to assist with disposition.   ESRD on HD Follow-up with nephrologist for hemodialysis.    Type II DM well controlled Glc have been appropriate, have dc Glc checks q4 and hold further insulin   Monitor periodic BMP / POC Glc prn suspect hypoglycemia   Comorbidities include hyperlipidemia, hypertension, PAD s/p stent, CAD, prior history of strokes, history of C3 anterior osteophyte fracture.   She is medically stable for discharge.  Awaiting placement to SNF.  Follow-up with TOC for  disposition.   Class 1 obesity based on BMI: Body mass index is 29.58 kg/m.SABRA Significantly low or high BMI is associated with higher medical risk.  Underweight - under 18  overweight - 25 to 29 obese - 30 or more Class 1 obesity: BMI of 30.0 to 34 Class 2 obesity: BMI of 35.0 to 39 Class 3 obesity: BMI of 40.0 to 49 Super Morbid Obesity: BMI 50-59 Super-super Morbid Obesity: BMI 60+ Healthy nutrition and physical activity advised as adjunct to other disease management and risk reduction treatments    DVT prophylaxis: heparin  IV fluids: no continuous IV fluids  Nutrition: cardiac diet  Central lines / other devices: none  Code Status: FULL CODE ACP documentation reviewed: Advanced directive is on file in [facility]  [facility] needs: placement Medical barriers to dispo: none.              Subjective / Brief ROS:  Patient resting  No concerns from RN    Family Communication: none at this time     Objective Findings:  Vitals:   08/22/24 0358 08/22/24 0730 08/22/24 0800 08/22/24 0809  BP: (!) 119/57 138/60 121/66 121/66  Pulse: 90 84 79 89  Resp: 16     Temp: (!) 97.4 F (36.3 C) 97.6 F (36.4 C)    TempSrc: Oral Oral    SpO2: 100%  92% 100%  Weight:  74.4 kg    Height:        Intake/Output Summary (Last 24 hours) at 08/22/2024 0836 Last data filed at 08/21/2024 0900 Gross per 24 hour  Intake 360 ml  Output --  Net 360 ml    Filed Weights   08/19/24 0756 08/19/24 1145 08/22/24 0730  Weight: 72.5 kg 71 kg 74.4 kg    Examination:  Physical Exam Constitutional:      General: She is not in acute distress. Pulmonary:     Effort: Pulmonary effort is normal.  Neurological:     Mental Status: Mental status is at baseline.          Scheduled Medications:   aspirin  EC  81 mg Oral Daily   atorvastatin   40 mg Oral Daily   calcium  acetate  667 mg Oral TID WC   Chlorhexidine  Gluconate Cloth  6 each Topical Q0600   clopidogrel   75 mg Oral Daily    heparin  injection (subcutaneous)  5,000 Units Subcutaneous Q8H   pantoprazole   40 mg Oral QHS   sertraline   100 mg Oral Daily   sodium chloride  flush  3 mL Intravenous Once    Continuous Infusions:   PRN Medications:  acetaminophen  **OR** acetaminophen  (TYLENOL ) oral liquid 160 mg/5 mL **OR** acetaminophen , ondansetron  (ZOFRAN ) IV, senna-docusate  Antimicrobials from admission:  Anti-infectives (From admission, onward)    None           Data Reviewed:  I have personally reviewed the following...  CBC: Recent Labs  Lab 08/15/24 1427 08/16/24 0548 08/17/24 0802 08/19/24 0800  WBC 7.9 7.7 7.7 9.0  NEUTROABS 5.5  --   --   --   HGB 11.2* 9.9* 10.0* 10.6*  HCT 35.2* 31.2* 31.4* 32.7*  MCV 86.3 86.9 86.0 84.1  PLT 186 189 191 230   Basic Metabolic Panel: Recent Labs  Lab 08/15/24 1427 08/16/24 0548 08/17/24 0803 08/19/24 0800  NA 139 137 135 130*  K 4.3 3.9 4.2 4.6  CL 96* 99 98 90*  CO2 29 26 24 23   GLUCOSE 98 78 85 112*  BUN 12 18 29* 33*  CREATININE 2.72* 3.65* 5.15* 5.33*  CALCIUM  8.8* 8.2* 8.2* 8.4*  PHOS  --  4.4 5.3* 6.4*   GFR: Estimated Creatinine Clearance: 8.8 mL/min (A) (by C-G formula based on SCr of 5.33 mg/dL (H)). Liver Function Tests: Recent Labs  Lab 08/15/24 1427 08/16/24 0548 08/17/24 0803 08/19/24 0800  AST 24  --   --   --   ALT 17  --   --   --   ALKPHOS 174*  --   --   --   BILITOT 0.4  --   --   --   PROT 8.5*  --   --   --   ALBUMIN  4.3 3.8 3.7 4.0   No results for input(s): LIPASE, AMYLASE in the last 168 hours. No results for input(s): AMMONIA in the last 168 hours. Coagulation Profile: Recent Labs  Lab 08/15/24 1427  INR 1.0   Cardiac Enzymes: No results for input(s): CKTOTAL, CKMB, CKMBINDEX, TROPONINI in the last 168 hours. BNP (last 3 results) No results for input(s): PROBNP in the last 8760 hours. HbA1C: No results for input(s): HGBA1C in the last 72 hours. CBG: Recent Labs  Lab  08/20/24 0400 08/20/24 0751 08/20/24 1226 08/20/24 1616 08/21/24 0757  GLUCAP 106* 105* 112* 117* 88   Lipid Profile: No results for input(s): CHOL, HDL, LDLCALC, TRIG, CHOLHDL, LDLDIRECT in the last 72 hours. Thyroid Function Tests: No results for input(s): TSH, T4TOTAL, FREET4, T3FREE, THYROIDAB in the last 72 hours. Anemia Panel: No results for input(s): VITAMINB12, FOLATE, FERRITIN, TIBC, IRON, RETICCTPCT in the last 72  hours. Most Recent Urinalysis On File:     Component Value Date/Time   COLORURINE YELLOW (A) 12/26/2021 1621   APPEARANCEUR HAZY (A) 12/26/2021 1621   LABSPEC 1.011 12/26/2021 1621   PHURINE 5.0 12/26/2021 1621   GLUCOSEU 150 (A) 12/26/2021 1621   HGBUR SMALL (A) 12/26/2021 1621   BILIRUBINUR NEGATIVE 12/26/2021 1621   KETONESUR NEGATIVE 12/26/2021 1621   PROTEINUR 100 (A) 12/26/2021 1621   NITRITE NEGATIVE 12/26/2021 1621   LEUKOCYTESUR TRACE (A) 12/26/2021 1621   Sepsis Labs: @LABRCNTIP (procalcitonin:4,lacticidven:4) Microbiology: No results found for this or any previous visit (from the past 240 hours).    Radiology Studies last 3 days: No results found.         Delva Derden, DO [facility] 08/22/2024, 8:36 AM    Dictation software may have been used to generate the above note. Typos may occur and escape review in typed/dictated notes. Please contact Dr Marsa directly for clarity if needed.  Staff may message me via secure chat in Epic  but this may not receive an immediate response,  please page me for urgent matters!  If 7PM-7AM, please contact night coverage [email or web address]

## 2024-08-22 NOTE — TOC Progression Note (Signed)
 Transition of Care [facility]) - Progression Note    Patient Details  Name: Deanna [name] MRN: [id number] Date of Birth: 12-26-1950  Transition of [facility] CM/SW Contact  Daved JONETTA Hamilton, RN Phone Number: 08/22/2024, 1:06 PM  Clinical Narrative:     Search area extended as no bed offers at this time, [facility] considering and anticipate bed availability early next week.   Expected Discharge Plan: Skilled Nursing Facility Barriers to Discharge: Insurance Authorization               Expected Discharge Plan and Services   Discharge Planning Services: CM Consult Post Acute Care Choice: Skilled Nursing Facility Living arrangements for the past 2 months: Assisted Living Facility Expected Discharge Date: 08/18/24                                     Social Drivers of Health (SDOH) Interventions SDOH Screenings   Food Insecurity: Patient Declined (08/17/2024)  Housing: Unknown (08/17/2024)  Transportation Needs: Patient Declined (08/17/2024)  Utilities: Patient Declined (08/17/2024)  Depression (PHQ2-9): Low Risk  (06/12/2024)  Financial Resource Strain: Low Risk  (07/22/2024)   Received from [facility]  Social Connections: Patient Declined (08/17/2024)  Tobacco Use: Low Risk  (08/15/2024)    Readmission Risk Interventions    01/16/2022    3:45 PM 12/29/2021    2:33 PM  Readmission Risk Prevention Plan  Transportation Screening Complete Complete  PCP or Specialist Appt within 5-7 Days  Complete  Home Care Screening  Complete  Medication Review (RN CM)  Complete  Medication Review (RN Care Manager) Complete   PCP or Specialist appointment within 3-5 days of discharge Complete   HRI or Home Care Consult Complete   SW Recovery Care/Counseling Consult Complete   Palliative Care Screening Not Applicable   Skilled Nursing Facility Not Applicable

## 2024-08-22 NOTE — Plan of Care (Signed)

## 2024-08-23 MED ORDER — TRAMADOL HCL 50 MG PO TABS
50.0000 mg | ORAL_TABLET | Freq: Four times a day (QID) | ORAL | Status: DC | PRN
  Administered 2024-08-23 – 2024-08-29 (×3): 50 mg via ORAL
  Filled 2024-08-23 (×4): qty 1

## 2024-08-23 NOTE — Progress Notes (Signed)
 [facility]  ROUNDING NOTE   Subjective:   Patient is known to our practice and receives outpatient dialysis treatments at [facility] on a MWF schedule, supervised by Dr. Marcelino.  Update: Patient seen and evaluated on dialysis. Verbal consent with son over the phone since patient cannot move right arm to sign. No issues with dialysis. Patient agitated with loss of function to right hand.  Update:  Patient seen resting in bed Complains of back pain   Objective:  Vital signs in last 24 hours:  Temp:  [97.8 F (36.6 C)-98.3 F (36.8 C)] 98.3 F (36.8 C) (11/29 0914) Pulse Rate:  [88-101] 88 (11/29 0914) Resp:  [16-18] 18 (11/29 0914) BP: (112-155)/(48-71) 155/71 (11/29 0914) SpO2:  [99 %-100 %] 99 % (11/29 0914)  Weight change:  Filed Weights   08/22/24 0730 08/22/24 1137 08/22/24 1149  Weight: 74.4 kg 73.3 kg 73.4 kg    Intake/Output: I/O last 3 completed shifts: In: 120 [P.O.:120] Out: 1400 [Other:1400]   Intake/Output this shift:  No intake/output data recorded.  Physical Exam: General: NAD  Head: Normocephalic  Lungs:  Clear, room air  Heart: Regular   Abdomen:  Soft  Extremities: No peripheral edema.   Neurologic: Alert, awake  Skin: dry  Access: Rt chest Permcath    Basic Metabolic Panel: Recent Labs  Lab 08/17/24 0803 08/19/24 0800 08/22/24 0800  NA 135 130* 128*  K 4.2 4.6 4.5  CL 98 90* 87*  CO2 24 23 23   GLUCOSE 85 112* 79  BUN 29* 33* 38*  CREATININE 5.15* 5.33* 6.79*  CALCIUM  8.2* 8.4* 8.4*  PHOS 5.3* 6.4* 5.7*    Liver Function Tests: Recent Labs  Lab 08/17/24 0803 08/19/24 0800 08/22/24 0800  ALBUMIN  3.7 4.0 4.0   No results for input(s): LIPASE, AMYLASE in the last 168 hours. No results for input(s): AMMONIA in the last 168 hours.  CBC: Recent Labs  Lab 08/17/24 0802 08/19/24 0800 08/22/24 0800  WBC 7.7 9.0 8.1  NEUTROABS  --   --  5.7  HGB 10.0* 10.6* 10.7*  HCT 31.4* 32.7* 32.8*  MCV 86.0  84.1 84.1  PLT 191 230 300    Cardiac Enzymes: No results for input(s): CKTOTAL, CKMB, CKMBINDEX, TROPONINI in the last 168 hours.  BNP: Invalid input(s): POCBNP  CBG: Recent Labs  Lab 08/20/24 1226 08/20/24 1616 08/21/24 0757 08/22/24 1233 08/22/24 1619  GLUCAP 112* 117* 88 83 103*    Microbiology: Results for orders placed or performed during the hospital encounter of 01/20/22  Blood culture (routine x 2)     Status: None   Collection Time: 01/20/22  7:22 PM   Specimen: BLOOD  Result Value Ref Range Status   Specimen Description BLOOD BLOOD RIGHT HAND  Final   Special Requests   Final    BOTTLES DRAWN AEROBIC AND ANAEROBIC Blood Culture results may not be optimal due to an inadequate volume of blood received in culture bottles   Culture   Final    NO GROWTH 5 DAYS Performed at [facility], [address] [place], [place] [address]    Report Status 01/25/2022 FINAL  Final  Blood culture (routine x 2)     Status: None   Collection Time: 01/20/22  7:22 PM   Specimen: BLOOD  Result Value Ref Range Status   Specimen Description BLOOD BLOOD LEFT HAND  Final   Special Requests   Final    BOTTLES DRAWN AEROBIC AND ANAEROBIC Blood Culture results  may not be optimal due to an inadequate volume of blood received in culture bottles   Culture   Final    NO GROWTH 5 DAYS Performed at [facility], [address] [place], [place] [address]    Report Status 01/25/2022 FINAL  Final  Resp Panel by RT-PCR (Flu A&B, Covid) Nasopharyngeal Swab     Status: None   Collection Time: 01/20/22  7:28 PM   Specimen: Nasopharyngeal Swab; Nasopharyngeal(NP) swabs in vial transport medium  Result Value Ref Range Status   SARS Coronavirus 2 by RT PCR NEGATIVE NEGATIVE Final    Comment: (NOTE) SARS-CoV-2 target nucleic acids are NOT DETECTED.  The SARS-CoV-2 RNA is generally detectable in upper respiratory specimens during the acute phase of infection. The  lowest concentration of SARS-CoV-2 viral copies this assay can detect is 138 copies/mL. A negative result does not preclude SARS-Cov-2 infection and should not be used as the sole basis for treatment or other patient management decisions. A negative result may occur with  improper specimen collection/handling, submission of specimen other than nasopharyngeal swab, presence of viral mutation(s) within the areas targeted by this assay, and inadequate number of viral copies(<138 copies/mL). A negative result must be combined with clinical observations, patient history, and epidemiological information. The expected result is Negative.  Fact Sheet for Patients:  bloggercourse.com  Fact Sheet for Healthcare Providers:  seriousbroker.it  This test is no t yet approved or cleared by the [place]  FDA and  has been authorized for detection and/or diagnosis of SARS-CoV-2 by FDA under an Emergency Use Authorization (EUA). This EUA will remain  in effect (meaning this test can be used) for the duration of the COVID-19 declaration under Section 564(b)(1) of the Act, 21 U.S.C.section 360bbb-3(b)(1), unless the authorization is terminated  or revoked sooner.       Influenza A by PCR NEGATIVE NEGATIVE Final   Influenza B by PCR NEGATIVE NEGATIVE Final    Comment: (NOTE) The Xpert Xpress SARS-CoV-2/FLU/RSV plus assay is intended as an aid in the diagnosis of influenza from Nasopharyngeal swab specimens and should not be used as a sole basis for treatment. Nasal washings and aspirates are unacceptable for Xpert Xpress SARS-CoV-2/FLU/RSV testing.  Fact Sheet for Patients: bloggercourse.com  Fact Sheet for Healthcare Providers: seriousbroker.it  This test is not yet approved or cleared by the [place]  FDA and has been authorized for detection and/or diagnosis of SARS-CoV-2 by FDA under  an Emergency Use Authorization (EUA). This EUA will remain in effect (meaning this test can be used) for the duration of the COVID-19 declaration under Section 564(b)(1) of the Act, 21 U.S.C. section 360bbb-3(b)(1), unless the authorization is terminated or revoked.  Performed at [facility], [address] [place], [place] [address]     Coagulation Studies: No results for input(s): LABPROT, INR in the last 72 hours.   Urinalysis: No results for input(s): COLORURINE, LABSPEC, PHURINE, GLUCOSEU, HGBUR, BILIRUBINUR, KETONESUR, PROTEINUR, UROBILINOGEN, NITRITE, LEUKOCYTESUR in the last 72 hours.  Invalid input(s): APPERANCEUR    Imaging: No results found.    Medications:     aspirin  EC  81 mg Oral Daily   atorvastatin   40 mg Oral Daily   calcium  acetate  667 mg Oral TID WC   Chlorhexidine  Gluconate Cloth  6 each Topical Q0600   clopidogrel   75 mg Oral Daily   heparin  injection (subcutaneous)  5,000 Units Subcutaneous Q8H   pantoprazole   40 mg Oral QHS   sertraline   100 mg  Oral Daily   sodium chloride  flush  3 mL Intravenous Once   acetaminophen  **OR** acetaminophen  (TYLENOL ) oral liquid 160 mg/5 mL **OR** acetaminophen , ondansetron  (ZOFRAN ) IV, senna-docusate, traMADol  Assessment/ Plan:  [name] is a 73 y.o.  female with a past medical history of ESRD on dialysis, CVA, coronary artery disease, status post PCI, CHF with reduced ejection fraction, diabetes mellitus, and hypertension, Patient presents to the ED from her dialysis center with acute onset of slurred speech and facial droop there. She was also experiencing worsened weakness/numbness of the right upper extremity relative to her baseline. Code Stroke was called.    [facility]/MWF/Rt PC/74.0 kg   End-stage renal disease on hemodialysis.   Received dialysis yesterday, UF 1.4L as tolerated  Next treatment scheduled for Monday. Will continue to monitor  discharge plans.   Acute versus subacute ischemic CVA anterior left temporal lobe status post tenecteplase  administration.  History of recurrent CVA. Loop recorder placed on 08/08/23. Prescribed aspirin  and Plavix  outpatient. CT head-likely acute or subacute nonhemorrhagic infarct in the anterior left temporal lobe measuring up to moderate confluent subcortical white matter hypoattenuation stable.  MRI brain- Acute nonhemorrhagic infarct in the posterior left frontal lobe involving the primary motor cortex, with associated T2/FLAIR hyperintensity. Confluent periventricular and subcortical T2 hyperintensities bilaterally, increased from the prior study. Remote infarcts in the anterior right insular cortex, right frontal lobe operculum, and lateral left temporal lobe are new since the prior exam. Plavix  and aspirin  continued per neurology.   3. Anemia of chronic kidney disease Hemoglobin & Hematocrit     Component Value Date/Time   HGB 10.7 (L) 08/22/2024 0800   HCT 32.8 (L) 08/22/2024 0800    Patient receives Mircera at outpatient clinic.  Hemoglobin 10.7 at last check   4. Secondary Hyperparathyroidism: with outpatient labs: PTH 652, phosphorus 5.2, corr calcium  08/06/2024.  Patient prescribed calcitriol, Cinacalcet and calcium  acetate outpatient.  Update: Will continue to monitor bone minerals.    5.  Hypertension chronic kidney disease  Patient currently not on any antihypertensive medications. Blood pressure 155/71       LOS: 8 Deanna [name] 11/29/202512:30 PM

## 2024-08-23 NOTE — Plan of Care (Signed)
  Problem: Education: Goal: Knowledge of disease or condition will improve Outcome: Progressing   Problem: Ischemic Stroke/TIA Tissue Perfusion: Goal: Complications of ischemic stroke/TIA will be minimized Outcome: Progressing   Problem: Coping: Goal: Will verbalize positive feelings about self Outcome: Progressing Goal: Will identify appropriate support needs Outcome: Progressing   Problem: Health Behavior/Discharge Planning: Goal: Ability to manage health-related needs will improve Outcome: Progressing Goal: Goals will be collaboratively established with patient/family Outcome: Progressing   Problem: Self-Care: Goal: Ability to participate in self-care as condition permits will improve Outcome: Progressing Goal: Verbalization of feelings and concerns over difficulty with self-care will improve Outcome: Progressing Goal: Ability to communicate needs accurately will improve Outcome: Progressing   Problem: Nutrition: Goal: Risk of aspiration will decrease Outcome: Progressing Goal: Dietary intake will improve Outcome: Progressing

## 2024-08-23 NOTE — Progress Notes (Signed)
 PT Cancellation Note  Patient Details Name: Deanna [name] MRN: [id number] DOB: [birth year]   Cancelled Treatment:    Reason Eval/Treat Not Completed: Patient declined, no reason specified Pt declined mobility my back hurting too bad right now I'm not getting up. After being educated on the importance of mobility pt further declined yelling NO I'm not getting up.   Shakhia Gramajo A Luian Schumpert 08/23/2024, 10:25 AM

## 2024-08-23 NOTE — Progress Notes (Signed)
 PROGRESS NOTE    Larkyn Greenberger   FMW:[id number] DOB: Dec 15, 1950  DOA: 08/15/2024 Date of Service: 08/23/24 which is hospital day 8  PCP: Brutus Delon SAUNDERS, NP    Hospital course / significant events:   Delmy Metheney is a 73 y.o. female with medical history significant for CVA, ESRD on HD, extensive PAD s/p angioplasty and stent, history of HFrEF with recovered EF, hypertension, hyperlipidemia, type 2 diabetes, CAD, DISH arthritis, C3 anterior osteophyte fracture, was brought from the outpatient dialysis center to the ED because of right facial droop and slurred speech.  She also complained of numbness and weakness in the right upper extremity.   11/21:She was by the neurologist for acute stroke.  She was given TNK at 3:15 PM on 08/15/2024. Admitted to ICU  11/22: stable, neuro s/o  11/23: PT/OT recs for SNF 11/24 - 11/28: awaiting SNF placement. Pt was going to [facility] but see TOC note, they have rescinded their offer, we are now waiting to hear back from [facility] to confirm bed there / any offers in [place]   Consultants:  Neurology  Procedures/Surgeries: none      ASSESSMENT & PLAN:   Acute left frontal lobe nonhemorrhagic stroke S/p tenecteplase  on 08/15/2024 at 3:15 PM. Follow-up MRI brain showed acute nonhemorrhagic infarct in the posterior left frontal lobe. Limited 2D echo view because patient was not cooperative. Continue aspirin , Plavix  and Lipitor. PT OT recommended discharge to SNF.   ESRD on HD Follow-up with nephrologist for hemodialysis.    Type II DM well controlled Glc have been appropriate, have dc Glc checks q4 and hold further insulin   Monitor periodic BMP / POC Glc prn suspect hypoglycemia   Comorbidities include hyperlipidemia, hypertension, PAD s/p stent, CAD, prior history of strokes, history of C3 anterior osteophyte fracture.   She is medically stable for discharge.  Awaiting placement to SNF.  Follow-up with TOC for  disposition.   Class 1 obesity based on BMI: Body mass index is 29.58 kg/m.SABRA Significantly low or high BMI is associated with higher medical risk.  Underweight - under 18  overweight - 25 to 29 obese - 30 or more Class 1 obesity: BMI of 30.0 to 34 Class 2 obesity: BMI of 35.0 to 39 Class 3 obesity: BMI of 40.0 to 49 Super Morbid Obesity: BMI 50-59 Super-super Morbid Obesity: BMI 60+ Healthy nutrition and physical activity advised as adjunct to other disease management and risk reduction treatments    DVT prophylaxis: heparin  IV fluids: no continuous IV fluids  Nutrition: cardiac diet  Central lines / other devices: none  Code Status: FULL CODE ACP documentation reviewed: Advanced directive is on file in [facility]YNCA  [facility] needs: placement Medical barriers to dispo: none.              Subjective / Brief ROS:  Patient alert No concerns from RN    Family Communication: none at this time     Objective Findings:  Vitals:   08/22/24 1243 08/22/24 1601 08/22/24 2026 08/23/24 0427  BP: 119/66 (!) 112/58 (!) 130/48 131/64  Pulse: (!) 101 95 100 95  Resp: 17 16 18    Temp: 98 F (36.7 C) 97.9 F (36.6 C) 97.8 F (36.6 C) 97.9 F (36.6 C)  TempSrc: Oral  Oral   SpO2: 100% 100% 100% 100%  Weight:      Height:        Intake/Output Summary (Last 24 hours) at 08/23/2024 9096 Last data filed at 08/22/2024 2156 Gross per  24 hour  Intake 120 ml  Output 1400 ml  Net -1280 ml    Filed Weights   08/22/24 0730 08/22/24 1137 08/22/24 1149  Weight: 74.4 kg 73.3 kg 73.4 kg    Examination:  Physical Exam Constitutional:      General: She is not in acute distress. Pulmonary:     Effort: Pulmonary effort is normal.  Neurological:     Mental Status: She is alert. Mental status is at baseline.          Scheduled Medications:   aspirin  EC  81 mg Oral Daily   atorvastatin   40 mg Oral Daily   calcium  acetate  667 mg Oral TID WC   Chlorhexidine  Gluconate Cloth   6 each Topical Q0600   clopidogrel   75 mg Oral Daily   heparin  injection (subcutaneous)  5,000 Units Subcutaneous Q8H   pantoprazole   40 mg Oral QHS   sertraline   100 mg Oral Daily   sodium chloride  flush  3 mL Intravenous Once    Continuous Infusions:   PRN Medications:  acetaminophen  **OR** acetaminophen  (TYLENOL ) oral liquid 160 mg/5 mL **OR** acetaminophen , ondansetron  (ZOFRAN ) IV, senna-docusate, traMADol   Antimicrobials from admission:  Anti-infectives (From admission, onward)    None           Data Reviewed:  I have personally reviewed the following...  CBC: Recent Labs  Lab 08/17/24 0802 08/19/24 0800 08/22/24 0800  WBC 7.7 9.0 8.1  NEUTROABS  --   --  5.7  HGB 10.0* 10.6* 10.7*  HCT 31.4* 32.7* 32.8*  MCV 86.0 84.1 84.1  PLT 191 230 300   Basic Metabolic Panel: Recent Labs  Lab 08/17/24 0803 08/19/24 0800 08/22/24 0800  NA 135 130* 128*  K 4.2 4.6 4.5  CL 98 90* 87*  CO2 24 23 23   GLUCOSE 85 112* 79  BUN 29* 33* 38*  CREATININE 5.15* 5.33* 6.79*  CALCIUM  8.2* 8.4* 8.4*  PHOS 5.3* 6.4* 5.7*   GFR: Estimated Creatinine Clearance: 6.9 mL/min (A) (by C-G formula based on SCr of 6.79 mg/dL (H)). Liver Function Tests: Recent Labs  Lab 08/17/24 0803 08/19/24 0800 08/22/24 0800  ALBUMIN  3.7 4.0 4.0   No results for input(s): LIPASE, AMYLASE in the last 168 hours. No results for input(s): AMMONIA in the last 168 hours. Coagulation Profile: No results for input(s): INR, PROTIME in the last 168 hours.  Cardiac Enzymes: No results for input(s): CKTOTAL, CKMB, CKMBINDEX, TROPONINI in the last 168 hours. BNP (last 3 results) No results for input(s): PROBNP in the last 8760 hours. HbA1C: No results for input(s): HGBA1C in the last 72 hours. CBG: Recent Labs  Lab 08/20/24 1226 08/20/24 1616 08/21/24 0757 08/22/24 1233 08/22/24 1619  GLUCAP 112* 117* 88 83 103*   Lipid Profile: No results for input(s): CHOL,  HDL, LDLCALC, TRIG, CHOLHDL, LDLDIRECT in the last 72 hours. Thyroid Function Tests: No results for input(s): TSH, T4TOTAL, FREET4, T3FREE, THYROIDAB in the last 72 hours. Anemia Panel: No results for input(s): VITAMINB12, FOLATE, FERRITIN, TIBC, IRON, RETICCTPCT in the last 72 hours. Most Recent Urinalysis On File:     Component Value Date/Time   COLORURINE YELLOW (A) 12/26/2021 1621   APPEARANCEUR HAZY (A) 12/26/2021 1621   LABSPEC 1.011 12/26/2021 1621   PHURINE 5.0 12/26/2021 1621   GLUCOSEU 150 (A) 12/26/2021 1621   HGBUR SMALL (A) 12/26/2021 1621   BILIRUBINUR NEGATIVE 12/26/2021 1621   KETONESUR NEGATIVE 12/26/2021 1621   PROTEINUR 100 (A) 12/26/2021 1621  NITRITE NEGATIVE 12/26/2021 1621   LEUKOCYTESUR TRACE (A) 12/26/2021 1621   Sepsis Labs: @LABRCNTIP (procalcitonin:4,lacticidven:4) Microbiology: No results found for this or any previous visit (from the past 240 hours).    Radiology Studies last 3 days: No results found.         Brenin Heidelberger, DO [facility] 08/23/2024, 9:03 AM    Dictation software may have been used to generate the above note. Typos may occur and escape review in typed/dictated notes. Please contact Dr Marsa directly for clarity if needed.  Staff may message me via secure chat in Epic  but this may not receive an immediate response,  please page me for urgent matters!  If 7PM-7AM, please contact night coverage [email or web address]

## 2024-08-23 NOTE — Plan of Care (Signed)

## 2024-08-23 NOTE — Progress Notes (Signed)
 Pt requesting entire bed to be elevated.  This RN provided education on fall risks, but pt insisted.  All rails raised to add protection against falling from elevated bed.

## 2024-08-24 DIAGNOSIS — I639 Cerebral infarction, unspecified: Secondary | ICD-10-CM | POA: Diagnosis not present

## 2024-08-24 MED ORDER — AMMONIUM LACTATE 12 % EX CREA
1.0000 | TOPICAL_CREAM | CUTANEOUS | Status: DC | PRN
  Filled 2024-08-24: qty 1

## 2024-08-24 MED ORDER — DICLOFENAC SODIUM 1 % EX GEL
2.0000 g | Freq: Four times a day (QID) | CUTANEOUS | Status: DC | PRN
  Administered 2024-08-25: 2 g via TOPICAL
  Filled 2024-08-24: qty 100

## 2024-08-24 MED ORDER — AMMONIUM LACTATE 12 % EX CREA: 1.0000 | TOPICAL_CREAM | Freq: Two times a day (BID) | CUTANEOUS | Status: DC

## 2024-08-24 NOTE — Progress Notes (Signed)
 PROGRESS NOTE    Deanna [name]   FMW:[id number] DOB: 02-13-1951  DOA: 08/15/2024 Date of Service: 08/24/24 which is hospital day 9  PCP: Brutus Delon SAUNDERS, NP    Hospital course / significant events:   Deanna [name] is a 73 y.o. female with medical history significant for CVA, ESRD on HD, extensive PAD s/p angioplasty and stent, history of HFrEF with recovered EF, hypertension, hyperlipidemia, type 2 diabetes, CAD, DISH arthritis, C3 anterior osteophyte fracture, was brought from the outpatient dialysis center to the ED because of right facial droop and slurred speech.  She also complained of numbness and weakness in the right upper extremity.   11/21:She was by the neurologist for acute stroke.  She was given TNK at 3:15 PM on 08/15/2024. Admitted to ICU  11/22: stable, neuro s/o  11/23: PT/OT recs for SNF 11/24 - 11/30: awaiting SNF placement. Pt was going to [facility] but see TOC note, they have rescinded their offer, we are now waiting to hear back from [facility] to confirm bed there / any offers in [place]   Consultants:  Neurology  Procedures/Surgeries: none      ASSESSMENT & PLAN:   Acute left frontal lobe nonhemorrhagic stroke S/p tenecteplase  on 08/15/2024 at 3:15 PM. Follow-up MRI brain showed acute nonhemorrhagic infarct in the posterior left frontal lobe. Limited 2D echo view because patient was not cooperative. Continue aspirin , Plavix  and Lipitor. PT OT recommended discharge to SNF.   ESRD on HD Follow-up with nephrologist for hemodialysis.    Type II DM well controlled Glc have been appropriate, have dc Glc checks q4 and hold further insulin   Monitor periodic BMP / POC Glc prn suspect hypoglycemia   Comorbidities include hyperlipidemia, hypertension, PAD s/p stent, CAD, prior history of strokes, history of C3 anterior osteophyte fracture.   She is medically stable for discharge.  Awaiting placement to SNF.  Follow-up with TOC for  disposition.   Class 1 obesity based on BMI: Body mass index is 29.58 kg/m.SABRA Significantly low or high BMI is associated with higher medical risk.  Underweight - under 18  overweight - 25 to 29 obese - 30 or more Class 1 obesity: BMI of 30.0 to 34 Class 2 obesity: BMI of 35.0 to 39 Class 3 obesity: BMI of 40.0 to 49 Super Morbid Obesity: BMI 50-59 Super-super Morbid Obesity: BMI 60+ Healthy nutrition and physical activity advised as adjunct to other disease management and risk reduction treatments    DVT prophylaxis: heparin  IV fluids: no continuous IV fluids  Nutrition: cardiac diet  Central lines / other devices: none  Code Status: FULL CODE ACP documentation reviewed: Advanced directive is on file in [facility]YNCA  [facility] needs: placement Medical barriers to dispo: none.              Subjective / Brief ROS:  Patient asking for Biflex cream for her shoulder aching No other complaints  No concerns from RN    Family Communication: none at this time     Objective Findings:  Vitals:   08/23/24 0914 08/23/24 1713 08/23/24 1952 08/24/24 0417  BP: (!) 155/71 (!) 145/88 137/71 129/62  Pulse: 88 78 87 88  Resp: 18 16 16 16   Temp: 98.3 F (36.8 C) 98.6 F (37 C) (!) 97.3 F (36.3 C) 97.7 F (36.5 C)  TempSrc:    Oral  SpO2: 99% 99% 100% 97%  Weight:      Height:        Intake/Output Summary (Last 24 hours)  at 08/24/2024 1247 Last data filed at 08/24/2024 1100 Gross per 24 hour  Intake 0 ml  Output --  Net 0 ml    Filed Weights   08/22/24 0730 08/22/24 1137 08/22/24 1149  Weight: 74.4 kg 73.3 kg 73.4 kg    Examination:  Physical Exam Constitutional:      General: She is not in acute distress. Cardiovascular:     Rate and Rhythm: Normal rate and regular rhythm.  Pulmonary:     Effort: Pulmonary effort is normal.     Breath sounds: Normal breath sounds.  Skin:    General: Skin is warm and dry.  Neurological:     Mental Status: She is alert.  Mental status is at baseline.          Scheduled Medications:   aspirin  EC  81 mg Oral Daily   atorvastatin   40 mg Oral Daily   calcium  acetate  667 mg Oral TID WC   Chlorhexidine  Gluconate Cloth  6 each Topical Q0600   clopidogrel   75 mg Oral Daily   heparin  injection (subcutaneous)  5,000 Units Subcutaneous Q8H   pantoprazole   40 mg Oral QHS   sertraline   100 mg Oral Daily   sodium chloride  flush  3 mL Intravenous Once    Continuous Infusions:   PRN Medications:  acetaminophen  **OR** acetaminophen  (TYLENOL ) oral liquid 160 mg/5 mL **OR** acetaminophen , ammonium lactate , diclofenac  Sodium, ondansetron  (ZOFRAN ) IV, senna-docusate, traMADol   Antimicrobials from admission:  Anti-infectives (From admission, onward)    None           Data Reviewed:  I have personally reviewed the following...  CBC: Recent Labs  Lab 08/19/24 0800 08/22/24 0800  WBC 9.0 8.1  NEUTROABS  --  5.7  HGB 10.6* 10.7*  HCT 32.7* 32.8*  MCV 84.1 84.1  PLT 230 300   Basic Metabolic Panel: Recent Labs  Lab 08/19/24 0800 08/22/24 0800  NA 130* 128*  K 4.6 4.5  CL 90* 87*  CO2 23 23  GLUCOSE 112* 79  BUN 33* 38*  CREATININE 5.33* 6.79*  CALCIUM  8.4* 8.4*  PHOS 6.4* 5.7*   GFR: Estimated Creatinine Clearance: 6.9 mL/min (A) (by C-G formula based on SCr of 6.79 mg/dL (H)). Liver Function Tests: Recent Labs  Lab 08/19/24 0800 08/22/24 0800  ALBUMIN  4.0 4.0   No results for input(s): LIPASE, AMYLASE in the last 168 hours. No results for input(s): AMMONIA in the last 168 hours. Coagulation Profile: No results for input(s): INR, PROTIME in the last 168 hours.  Cardiac Enzymes: No results for input(s): CKTOTAL, CKMB, CKMBINDEX, TROPONINI in the last 168 hours. BNP (last 3 results) No results for input(s): PROBNP in the last 8760 hours. HbA1C: No results for input(s): HGBA1C in the last 72 hours. CBG: Recent Labs  Lab 08/20/24 1226 08/20/24 1616  08/21/24 0757 08/22/24 1233 08/22/24 1619  GLUCAP 112* 117* 88 83 103*   Lipid Profile: No results for input(s): CHOL, HDL, LDLCALC, TRIG, CHOLHDL, LDLDIRECT in the last 72 hours. Thyroid Function Tests: No results for input(s): TSH, T4TOTAL, FREET4, T3FREE, THYROIDAB in the last 72 hours. Anemia Panel: No results for input(s): VITAMINB12, FOLATE, FERRITIN, TIBC, IRON, RETICCTPCT in the last 72 hours. Most Recent Urinalysis On File:     Component Value Date/Time   COLORURINE YELLOW (A) 12/26/2021 1621   APPEARANCEUR HAZY (A) 12/26/2021 1621   LABSPEC 1.011 12/26/2021 1621   PHURINE 5.0 12/26/2021 1621   GLUCOSEU 150 (A) 12/26/2021 1621   HGBUR  SMALL (A) 12/26/2021 1621   BILIRUBINUR NEGATIVE 12/26/2021 1621   KETONESUR NEGATIVE 12/26/2021 1621   PROTEINUR 100 (A) 12/26/2021 1621   NITRITE NEGATIVE 12/26/2021 1621   LEUKOCYTESUR TRACE (A) 12/26/2021 1621   Sepsis Labs: @LABRCNTIP (procalcitonin:4,lacticidven:4) Microbiology: No results found for this or any previous visit (from the past 240 hours).    Radiology Studies last 3 days: No results found.         Chinonso Linker, DO [facility] 08/24/2024, 12:47 PM    Dictation software may have been used to generate the above note. Typos may occur and escape review in typed/dictated notes. Please contact Dr Marsa directly for clarity if needed.  Staff may message me via secure chat in Epic  but this may not receive an immediate response,  please page me for urgent matters!  If 7PM-7AM, please contact night coverage [email or web address]

## 2024-08-24 NOTE — Progress Notes (Signed)
 Physical Therapy Treatment Patient Details Name: Deanna [name] MRN: [id number] DOB: Feb 05, 1951 Today's Date: 08/24/2024   History of Present Illness [name] is a 73 y.o. female with a past medical history of ESRD on dialysis, CAD, CVA, status post PCI, CHF with reduced ejection fraction, DM, HTN. Patient presents to the ED from her dialysis center with acute onset of slurred speech and facial droop there. She was also experiencing worsened weakness/numbness of the right upper extremity relative to her baseline. Found to have acute left frontal lobe nonhemorrhagic stroke: S/p tenecteplase  on 08/15/2024. Follow-up MRI brain showed acute nonhemorrhagic infarct in the posterior left frontal lobe.    PT Comments  Pt in bed.  Pt refuses all mobility but does agree to [employer].  Initially stated she cannot move it at all but does have fair AAROM and assists with mobility.  Pt encouraged to reposition in bed as she is rolled to L side with head resting on handrail and generally appears uncomfortable.  She refuses all attempts to reposition or for mobility.  Attempted LE's but she stated there is nothing wrong with my legs.  Pt self limiting.  Pt with bed raised up in air.  Bed is placed in low position per protocol for safety.  She voices being upset that it is not raised up it is not helping me down here.  HOB and feet position left as requested.     If plan is discharge home, recommend the following: Two people to help with walking and/or transfers;Two people to help with bathing/dressing/bathroom   Can travel by private vehicle        Equipment Recommendations  None recommended by PT    Recommendations for Other Services       Precautions / Restrictions Precautions Precautions: Fall Recall of Precautions/Restrictions: Impaired Restrictions Weight Bearing Restrictions Per Provider Order: No     Mobility  Bed Mobility               General bed mobility comments:  refused    Transfers                   General transfer comment: NT this session. Anticipate +2 continued    Ambulation/Gait               General Gait Details: unable   Stairs             Wheelchair Mobility     Tilt Bed    Modified Rankin (Stroke Patients Only)       Balance                                            Communication Communication Communication: No apparent difficulties  Cognition Arousal: Alert Behavior During Therapy: Flat affect   PT - Cognitive impairments: Problem solving, Safety/Judgement                         Following commands: Intact      Cueing Cueing Techniques: Verbal cues, Gestural cues, Tactile cues, Visual cues  Exercises      General Comments        Pertinent Vitals/Pain Pain Assessment Pain Assessment: No/denies pain    Home Living  Prior Function            PT Goals (current goals can now be found in the care plan section) Progress towards PT goals: Progressing toward goals    Frequency    Min 2X/week      PT Plan      Co-evaluation              AM-PAC PT 6 Clicks Mobility   Outcome Measure  Help needed turning from your back to your side while in a flat bed without using bedrails?: A Lot Help needed moving from lying on your back to sitting on the side of a flat bed without using bedrails?: A Lot Help needed moving to and from a bed to a chair (including a wheelchair)?: A Lot Help needed standing up from a chair using your arms (e.g., wheelchair or bedside chair)?: A Lot Help needed to walk in hospital room?: Total Help needed climbing 3-5 steps with a railing? : Total 6 Click Score: 10    End of Session   Activity Tolerance: Treatment limited secondary to agitation Patient left: in bed;with call bell/phone within reach;with bed alarm set Nurse Communication: Mobility status PT Visit Diagnosis: Other  abnormalities of gait and mobility (R26.89);Muscle weakness (generalized) (M62.81) Hemiplegia - Right/Left: Right Hemiplegia - dominant/non-dominant: Dominant     Time: 1101-1109 PT Time Calculation (min) (ACUTE ONLY): 8 min  Charges:    $Therapeutic Exercise: 8-22 mins PT General Charges $$ ACUTE PT VISIT: 1 Visit                   Lauraine Gills, PTA 08/24/24, 11:17 AM

## 2024-08-24 NOTE — Plan of Care (Signed)
  Problem: Education: Goal: Knowledge of General Education information will improve Description: Including pain rating scale, medication(s)/side effects and non-pharmacologic comfort measures Outcome: Progressing   Problem: Clinical Measurements: Goal: Ability to maintain clinical measurements within normal limits will improve Outcome: Progressing Goal: Will remain free from infection Outcome: Progressing Goal: Diagnostic test results will improve Outcome: Progressing Goal: Respiratory complications will improve Outcome: Progressing Goal: Cardiovascular complication will be avoided Outcome: Progressing   Problem: Elimination: Goal: Will not experience complications related to bowel motility Outcome: Progressing   Problem: Pain Managment: Goal: General experience of comfort will improve and/or be controlled Outcome: Progressing   Problem: Safety: Goal: Ability to remain free from injury will improve Outcome: Progressing   Problem: Skin Integrity: Goal: Risk for impaired skin integrity will decrease Outcome: Progressing

## 2024-08-24 NOTE — Progress Notes (Signed)
 [facility]  ROUNDING NOTE   Subjective:   Patient is known to our practice and receives outpatient dialysis treatments at [facility] on a MWF schedule, supervised by Dr. Marcelino.  Update: Patient seen and evaluated on dialysis. Verbal consent with son over the phone since patient cannot move right arm to sign. No issues with dialysis. Patient agitated with loss of function to right hand.  Update:  Patient seen sitting up in bed Currently awaiting breakfast Continues to complain of back discomfort Frustrated with lack of mobility of right hand/arm   Objective:  Vital signs in last 24 hours:  Temp:  [97.3 F (36.3 C)-98.6 F (37 C)] 97.7 F (36.5 C) (11/30 0417) Pulse Rate:  [78-88] 88 (11/30 0417) Resp:  [16-18] 16 (11/30 0417) BP: (129-155)/(62-88) 129/62 (11/30 0417) SpO2:  [97 %-100 %] 97 % (11/30 0417)  Weight change:  Filed Weights   08/22/24 0730 08/22/24 1137 08/22/24 1149  Weight: 74.4 kg 73.3 kg 73.4 kg    Intake/Output: I/O last 3 completed shifts: In: 120 [P.O.:120] Out: -    Intake/Output this shift:  No intake/output data recorded.  Physical Exam: General: NAD  Head: Normocephalic  Lungs:  Clear, room air  Heart: Regular   Abdomen:  Soft  Extremities: No peripheral edema.   Neurologic: Alert, awake, conversant  Skin: dry  Access: Rt chest Permcath    Basic Metabolic Panel: Recent Labs  Lab 08/19/24 0800 08/22/24 0800  NA 130* 128*  K 4.6 4.5  CL 90* 87*  CO2 23 23  GLUCOSE 112* 79  BUN 33* 38*  CREATININE 5.33* 6.79*  CALCIUM  8.4* 8.4*  PHOS 6.4* 5.7*    Liver Function Tests: Recent Labs  Lab 08/19/24 0800 08/22/24 0800  ALBUMIN  4.0 4.0   No results for input(s): LIPASE, AMYLASE in the last 168 hours. No results for input(s): AMMONIA in the last 168 hours.  CBC: Recent Labs  Lab 08/19/24 0800 08/22/24 0800  WBC 9.0 8.1  NEUTROABS  --  5.7  HGB 10.6* 10.7*  HCT 32.7* 32.8*  MCV 84.1 84.1  PLT  230 300    Cardiac Enzymes: No results for input(s): CKTOTAL, CKMB, CKMBINDEX, TROPONINI in the last 168 hours.  BNP: Invalid input(s): POCBNP  CBG: Recent Labs  Lab 08/20/24 1226 08/20/24 1616 08/21/24 0757 08/22/24 1233 08/22/24 1619  GLUCAP 112* 117* 88 83 103*    Microbiology: Results for orders placed or performed during the hospital encounter of 01/20/22  Blood culture (routine x 2)     Status: None   Collection Time: 01/20/22  7:22 PM   Specimen: BLOOD  Result Value Ref Range Status   Specimen Description BLOOD BLOOD RIGHT HAND  Final   Special Requests   Final    BOTTLES DRAWN AEROBIC AND ANAEROBIC Blood Culture results may not be optimal due to an inadequate volume of blood received in culture bottles   Culture   Final    NO GROWTH 5 DAYS Performed at [facility], [address] [place], [place] [address]    Report Status 01/25/2022 FINAL  Final  Blood culture (routine x 2)     Status: None   Collection Time: 01/20/22  7:22 PM   Specimen: BLOOD  Result Value Ref Range Status   Specimen Description BLOOD BLOOD LEFT HAND  Final   Special Requests   Final    BOTTLES DRAWN AEROBIC AND ANAEROBIC Blood Culture results may not be optimal due to an inadequate volume  of blood received in culture bottles   Culture   Final    NO GROWTH 5 DAYS Performed at [facility], [address] [place], [place] [address]    Report Status 01/25/2022 FINAL  Final  Resp Panel by RT-PCR (Flu A&B, Covid) Nasopharyngeal Swab     Status: None   Collection Time: 01/20/22  7:28 PM   Specimen: Nasopharyngeal Swab; Nasopharyngeal(NP) swabs in vial transport medium  Result Value Ref Range Status   SARS Coronavirus 2 by RT PCR NEGATIVE NEGATIVE Final    Comment: (NOTE) SARS-CoV-2 target nucleic acids are NOT DETECTED.  The SARS-CoV-2 RNA is generally detectable in upper respiratory specimens during the acute phase of infection. The lowest concentration  of SARS-CoV-2 viral copies this assay can detect is 138 copies/mL. A negative result does not preclude SARS-Cov-2 infection and should not be used as the sole basis for treatment or other patient management decisions. A negative result may occur with  improper specimen collection/handling, submission of specimen other than nasopharyngeal swab, presence of viral mutation(s) within the areas targeted by this assay, and inadequate number of viral copies(<138 copies/mL). A negative result must be combined with clinical observations, patient history, and epidemiological information. The expected result is Negative.  Fact Sheet for Patients:  bloggercourse.com  Fact Sheet for Healthcare Providers:  seriousbroker.it  This test is no t yet approved or cleared by the [place]  FDA and  has been authorized for detection and/or diagnosis of SARS-CoV-2 by FDA under an Emergency Use Authorization (EUA). This EUA will remain  in effect (meaning this test can be used) for the duration of the COVID-19 declaration under Section 564(b)(1) of the Act, 21 U.S.C.section 360bbb-3(b)(1), unless the authorization is terminated  or revoked sooner.       Influenza A by PCR NEGATIVE NEGATIVE Final   Influenza B by PCR NEGATIVE NEGATIVE Final    Comment: (NOTE) The Xpert Xpress SARS-CoV-2/FLU/RSV plus assay is intended as an aid in the diagnosis of influenza from Nasopharyngeal swab specimens and should not be used as a sole basis for treatment. Nasal washings and aspirates are unacceptable for Xpert Xpress SARS-CoV-2/FLU/RSV testing.  Fact Sheet for Patients: bloggercourse.com  Fact Sheet for Healthcare Providers: seriousbroker.it  This test is not yet approved or cleared by the [place]  FDA and has been authorized for detection and/or diagnosis of SARS-CoV-2 by FDA under an Emergency Use  Authorization (EUA). This EUA will remain in effect (meaning this test can be used) for the duration of the COVID-19 declaration under Section 564(b)(1) of the Act, 21 U.S.C. section 360bbb-3(b)(1), unless the authorization is terminated or revoked.  Performed at [facility], [address] [place], [place] [address]     Coagulation Studies: No results for input(s): LABPROT, INR in the last 72 hours.   Urinalysis: No results for input(s): COLORURINE, LABSPEC, PHURINE, GLUCOSEU, HGBUR, BILIRUBINUR, KETONESUR, PROTEINUR, UROBILINOGEN, NITRITE, LEUKOCYTESUR in the last 72 hours.  Invalid input(s): APPERANCEUR    Imaging: No results found.    Medications:     aspirin  EC  81 mg Oral Daily   atorvastatin   40 mg Oral Daily   calcium  acetate  667 mg Oral TID WC   Chlorhexidine  Gluconate Cloth  6 each Topical Q0600   clopidogrel   75 mg Oral Daily   heparin  injection (subcutaneous)  5,000 Units Subcutaneous Q8H   pantoprazole   40 mg Oral QHS   sertraline   100 mg Oral Daily   sodium chloride  flush  3  mL Intravenous Once   acetaminophen  **OR** acetaminophen  (TYLENOL ) oral liquid 160 mg/5 mL **OR** acetaminophen , ondansetron  (ZOFRAN ) IV, senna-docusate, traMADol  Assessment/ Plan:  [name] is a 73 y.o.  female with a past medical history of ESRD on dialysis, CVA, coronary artery disease, status post PCI, CHF with reduced ejection fraction, diabetes mellitus, and hypertension, Patient presents to the ED from her dialysis center with acute onset of slurred speech and facial droop there. She was also experiencing worsened weakness/numbness of the right upper extremity relative to her baseline. Code Stroke was called.    [facility]/MWF/Rt PC/74.0 kg   End-stage renal disease on hemodialysis.   Next treatment scheduled for Monday. Will continue to monitor discharge plans.   Acute versus subacute ischemic CVA anterior left  temporal lobe status post tenecteplase  administration.  History of recurrent CVA. Loop recorder placed on 08/08/23. Prescribed aspirin  and Plavix  outpatient. CT head-likely acute or subacute nonhemorrhagic infarct in the anterior left temporal lobe measuring up to moderate confluent subcortical white matter hypoattenuation stable.  MRI brain- Acute nonhemorrhagic infarct in the posterior left frontal lobe involving the primary motor cortex, with associated T2/FLAIR hyperintensity. Confluent periventricular and subcortical T2 hyperintensities bilaterally, increased from the prior study. Remote infarcts in the anterior right insular cortex, right frontal lobe operculum, and lateral left temporal lobe are new since the prior exam. Plavix  and aspirin  continued per neurology.  Pending rehab placement  3. Anemia of chronic kidney disease Hemoglobin & Hematocrit     Component Value Date/Time   HGB 10.7 (L) 08/22/2024 0800   HCT 32.8 (L) 08/22/2024 0800    Patient receives Mircera at outpatient clinic.  Will recheck labs with dialysis tomorrow.   4. Secondary Hyperparathyroidism: with outpatient labs: PTH 652, phosphorus 5.2, corr calcium  08/06/2024.  Patient prescribed calcitriol, Cinacalcet and calcium  acetate outpatient.  Update: Will order updated labs with dialysis.  5.  Hypertension chronic kidney disease  Patient currently not on any antihypertensive medications. Blood pressure remained stable for this patient       LOS: 9 Early Ord 11/30/20259:02 AM

## 2024-08-25 LAB — CBC WITH DIFFERENTIAL/PLATELET
Abs Immature Granulocytes: 0.06 K/uL (ref 0.00–0.07)
Basophils Absolute: 0.1 K/uL (ref 0.0–0.1)
Basophils Relative: 1 %
Eosinophils Absolute: 0.1 K/uL (ref 0.0–0.5)
Eosinophils Relative: 2 %
HCT: 35.3 % — ABNORMAL LOW (ref 36.0–46.0)
Hemoglobin: 11.3 g/dL — ABNORMAL LOW (ref 12.0–15.0)
Immature Granulocytes: 1 %
Lymphocytes Relative: 22 %
Lymphs Abs: 1.6 K/uL (ref 0.7–4.0)
MCH: 27 pg (ref 26.0–34.0)
MCHC: 32 g/dL (ref 30.0–36.0)
MCV: 84.2 fL (ref 80.0–100.0)
Monocytes Absolute: 0.5 K/uL (ref 0.1–1.0)
Monocytes Relative: 6 %
Neutro Abs: 4.8 K/uL (ref 1.7–7.7)
Neutrophils Relative %: 68 %
Platelets: 350 K/uL (ref 150–400)
RBC: 4.19 MIL/uL (ref 3.87–5.11)
RDW: 16.5 % — ABNORMAL HIGH (ref 11.5–15.5)
WBC: 7.1 K/uL (ref 4.0–10.5)
nRBC: 0 % (ref 0.0–0.2)

## 2024-08-25 LAB — RENAL FUNCTION PANEL
Albumin: 4.1 g/dL (ref 3.5–5.0)
Anion gap: 16 — ABNORMAL HIGH (ref 5–15)
BUN: 43 mg/dL — ABNORMAL HIGH (ref 8–23)
CO2: 24 mmol/L (ref 22–32)
Calcium: 8.8 mg/dL — ABNORMAL LOW (ref 8.9–10.3)
Chloride: 90 mmol/L — ABNORMAL LOW (ref 98–111)
Creatinine, Ser: 6.84 mg/dL — ABNORMAL HIGH (ref 0.44–1.00)
GFR, Estimated: 6 mL/min — ABNORMAL LOW (ref >60)
Glucose, Bld: 92 mg/dL (ref 70–99)
Phosphorus: 6.2 mg/dL — ABNORMAL HIGH (ref 2.5–4.6)
Potassium: 4.9 mmol/L (ref 3.5–5.1)
Sodium: 130 mmol/L — ABNORMAL LOW (ref 135–145)

## 2024-08-25 MED ORDER — ALTEPLASE 2 MG IJ SOLR: 2.0000 mg | Freq: Once | INTRAMUSCULAR | Status: DC | PRN

## 2024-08-25 MED ORDER — HEPARIN SODIUM (PORCINE) 1000 UNIT/ML IJ SOLN
INTRAMUSCULAR | Status: AC
  Filled 2024-08-25: qty 4

## 2024-08-25 MED ORDER — HEPARIN SODIUM (PORCINE) 1000 UNIT/ML DIALYSIS: 1000.0000 [IU] | INTRAMUSCULAR | Status: DC | PRN

## 2024-08-25 MED ORDER — HEPARIN SODIUM (PORCINE) 1000 UNIT/ML DIALYSIS
1000.0000 [IU] | INTRAMUSCULAR | Status: DC | PRN
  Administered 2024-08-25: 3200 [IU]

## 2024-08-25 NOTE — Progress Notes (Signed)
 PROGRESS NOTE    Deanna [name]   FMW:[id number] DOB: 1951-06-04  DOA: 08/15/2024 Date of Service: 08/25/24 which is hospital day 10  PCP: Brutus Delon SAUNDERS, NP    Hospital course / significant events:   Deanna [name] is a 73 y.o. female with medical history significant for CVA, ESRD on HD, extensive PAD s/p angioplasty and stent, history of HFrEF with recovered EF, hypertension, hyperlipidemia, type 2 diabetes, CAD, DISH arthritis, C3 anterior osteophyte fracture, was brought from the outpatient dialysis center to the ED because of right facial droop and slurred speech.  She also complained of numbness and weakness in the right upper extremity.   11/21:She was by the neurologist for acute stroke.  She was given TNK at 3:15 PM on 08/15/2024. Admitted to ICU  11/22: stable, neuro s/o  11/23: PT/OT recs for SNF 11/24 - 12/01: awaiting SNF placement. Pt was going to [facility] but see TOC note, they have rescinded their offer, we are now waiting to hear back from [facility] to confirm bed there / any offers in [place]   Consultants:  Neurology  Procedures/Surgeries: none      ASSESSMENT & PLAN:   Acute left frontal lobe nonhemorrhagic stroke S/p tenecteplase  on 08/15/2024 at 3:15 PM. Follow-up MRI brain showed acute nonhemorrhagic infarct in the posterior left frontal lobe. Limited 2D echo view because patient was not cooperative. Continue aspirin , Plavix  and Lipitor. PT OT recommended discharge to SNF.   ESRD on HD Follow-up with nephrologist for hemodialysis.    Type II DM well controlled Glc have been appropriate, have dc Glc checks q4 and hold further insulin   Monitor periodic BMP / POC Glc prn suspect hypoglycemia   Comorbidities include hyperlipidemia, hypertension, PAD s/p stent, CAD, prior history of strokes, history of C3 anterior osteophyte fracture.   She is medically stable for discharge.  Awaiting placement to SNF.  Follow-up with TOC for  disposition.   Class 1 obesity based on BMI: Body mass index is 29.58 kg/m.SABRA Significantly low or high BMI is associated with higher medical risk.  Underweight - under 18  overweight - 25 to 29 obese - 30 or more Class 1 obesity: BMI of 30.0 to 34 Class 2 obesity: BMI of 35.0 to 39 Class 3 obesity: BMI of 40.0 to 49 Super Morbid Obesity: BMI 50-59 Super-super Morbid Obesity: BMI 60+ Healthy nutrition and physical activity advised as adjunct to other disease management and risk reduction treatments    DVT prophylaxis: heparin  IV fluids: no continuous IV fluids  Nutrition: cardiac diet  Central lines / other devices: none  Code Status: FULL CODE ACP documentation reviewed: Advanced directive is on file in [facility]  [facility] needs: placement Medical barriers to dispo: none.              Subjective / Brief ROS:  No concerns    Family Communication: none at this time     Objective Findings:  Vitals:   08/25/24 1230 08/25/24 1248 08/25/24 1251 08/25/24 1558  BP: (!) 89/56 (!) 115/56 115/66 109/64  Pulse: 94 100 88 97  Resp: (!) 28 17 16 14   Temp:  98.1 F (36.7 C)  98 F (36.7 C)  TempSrc:  Oral  Oral  SpO2: 100% 100% 100% 100%  Weight:   71.4 kg   Height:        Intake/Output Summary (Last 24 hours) at 08/25/2024 1648 Last data filed at 08/25/2024 1251 Gross per 24 hour  Intake 0 ml  Output 1100  ml  Net -1100 ml    Filed Weights   08/22/24 1149 08/25/24 0806 08/25/24 1251  Weight: 73.4 kg 72.5 kg 71.4 kg    Examination:  Physical Exam Constitutional:      General: She is not in acute distress. Pulmonary:     Effort: Pulmonary effort is normal.  Neurological:     Mental Status: She is alert. Mental status is at baseline.          Scheduled Medications:   aspirin  EC  81 mg Oral Daily   atorvastatin   40 mg Oral Daily   calcium  acetate  667 mg Oral TID WC   Chlorhexidine  Gluconate Cloth  6 each Topical Q0600   clopidogrel   75 mg Oral Daily    heparin  injection (subcutaneous)  5,000 Units Subcutaneous Q8H   pantoprazole   40 mg Oral QHS   sertraline   100 mg Oral Daily   sodium chloride  flush  3 mL Intravenous Once    Continuous Infusions:   PRN Medications:  acetaminophen  **OR** acetaminophen  (TYLENOL ) oral liquid 160 mg/5 mL **OR** acetaminophen , ammonium lactate, diclofenac Sodium, ondansetron  (ZOFRAN ) IV, senna-docusate, traMADol  Antimicrobials from admission:  Anti-infectives (From admission, onward)    None           Data Reviewed:  I have personally reviewed the following...  CBC: Recent Labs  Lab 08/19/24 0800 08/22/24 0800 08/25/24 1115  WBC 9.0 8.1 7.1  NEUTROABS  --  5.7 4.8  HGB 10.6* 10.7* 11.3*  HCT 32.7* 32.8* 35.3*  MCV 84.1 84.1 84.2  PLT 230 300 350   Basic Metabolic Panel: Recent Labs  Lab 08/19/24 0800 08/22/24 0800 08/25/24 0940  NA 130* 128* 130*  K 4.6 4.5 4.9  CL 90* 87* 90*  CO2 23 23 24   GLUCOSE 112* 79 92  BUN 33* 38* 43*  CREATININE 5.33* 6.79* 6.84*  CALCIUM  8.4* 8.4* 8.8*  PHOS 6.4* 5.7* 6.2*   GFR: Estimated Creatinine Clearance: 6.7 mL/min (A) (by C-G formula based on SCr of 6.84 mg/dL (H)). Liver Function Tests: Recent Labs  Lab 08/19/24 0800 08/22/24 0800 08/25/24 0940  ALBUMIN  4.0 4.0 4.1   No results for input(s): LIPASE, AMYLASE in the last 168 hours. No results for input(s): AMMONIA in the last 168 hours. Coagulation Profile: No results for input(s): INR, PROTIME in the last 168 hours.  Cardiac Enzymes: No results for input(s): CKTOTAL, CKMB, CKMBINDEX, TROPONINI in the last 168 hours. BNP (last 3 results) No results for input(s): PROBNP in the last 8760 hours. HbA1C: No results for input(s): HGBA1C in the last 72 hours. CBG: Recent Labs  Lab 08/20/24 1226 08/20/24 1616 08/21/24 0757 08/22/24 1233 08/22/24 1619  GLUCAP 112* 117* 88 83 103*   Lipid Profile: No results for input(s): CHOL, HDL, LDLCALC,  TRIG, CHOLHDL, LDLDIRECT in the last 72 hours. Thyroid Function Tests: No results for input(s): TSH, T4TOTAL, FREET4, T3FREE, THYROIDAB in the last 72 hours. Anemia Panel: No results for input(s): VITAMINB12, FOLATE, FERRITIN, TIBC, IRON, RETICCTPCT in the last 72 hours. Most Recent Urinalysis On File:     Component Value Date/Time   COLORURINE YELLOW (A) 12/26/2021 1621   APPEARANCEUR HAZY (A) 12/26/2021 1621   LABSPEC 1.011 12/26/2021 1621   PHURINE 5.0 12/26/2021 1621   GLUCOSEU 150 (A) 12/26/2021 1621   HGBUR SMALL (A) 12/26/2021 1621   BILIRUBINUR NEGATIVE 12/26/2021 1621   KETONESUR NEGATIVE 12/26/2021 1621   PROTEINUR 100 (A) 12/26/2021 1621   NITRITE NEGATIVE 12/26/2021 1621  LEUKOCYTESUR TRACE (A) 12/26/2021 1621   Sepsis Labs: @LABRCNTIP (procalcitonin:4,lacticidven:4) Microbiology: No results found for this or any previous visit (from the past 240 hours).    Radiology Studies last 3 days: No results found.         Zanaria Morell, DO [facility] 08/25/2024, 4:48 PM    Dictation software may have been used to generate the above note. Typos may occur and escape review in typed/dictated notes. Please contact Dr Marsa directly for clarity if needed.  Staff may message me via secure chat in Epic  but this may not receive an immediate response,  please page me for urgent matters!  If 7PM-7AM, please contact night coverage [email or web address]

## 2024-08-25 NOTE — Progress Notes (Signed)
 PT Cancellation Note  Patient Details Name: Deanna [name] MRN: [id number] DOB: 27-Aug-1951   Cancelled Treatment:    Reason Eval/Treat Not Completed: Other (comment) Pt off the floor at dialysis at this time. Will f/u as able.  Deanna [name], PT, DPT 08/25/24, 8:46 AM   Deanna [name] 08/25/2024, 8:46 AM

## 2024-08-25 NOTE — Plan of Care (Signed)
 Problem: Ischemic Stroke/TIA Tissue Perfusion: Goal: Complications of ischemic stroke/TIA will be minimized Outcome: Not Progressing  Problem: Education: Goal: Knowledge of disease or condition will improve Outcome: Not Progressing   Problem: Coping: Goal: Will verbalize positive feelings about self Outcome: Not Progressing Goal: Will identify appropriate support needs Outcome: Not Progressing   Problem: Health Behavior/Discharge Planning: Goal: Ability to manage health-related needs will improve Outcome: Not Progressing Goal: Goals will be collaboratively established with patient/family Outcome: Not Progressing     Pt non compliant with plan of care for stroke.  Refusing medications and PT/OT. -----------------------------------------------------------------------------------------  Problem: Self-Care: Goal: Ability to participate in self-care as condition permits will improve Outcome: Progressing Goal: Verbalization of feelings and concerns over difficulty with self-care will improve Outcome: Progressing Goal: Ability to communicate needs accurately will improve Outcome: Progressing   Problem: Nutrition: Goal: Risk of aspiration will decrease Outcome: Progressing Goal: Dietary intake will improve Outcome: Progressing

## 2024-08-25 NOTE — Plan of Care (Signed)

## 2024-08-25 NOTE — Progress Notes (Signed)
   08/25/24 1251  Vitals  BP 115/66  BP Location Left Arm  BP Method Automatic  Patient Position (if appropriate) Lying  Pulse Rate 88  Pulse Rate Source Monitor  Resp 16  Weight 71.4 kg  Type of Weight Post-Dialysis  Oxygen Therapy  SpO2 100 %  O2 Device Room Air  Patient Activity (if Appropriate) In bed  Pulse Oximetry Type Continuous  Post Treatment  Dialyzer Clearance Lightly streaked  Hemodialysis Intake (mL) 0 mL  Liters Processed 76.8  Fluid Removed (mL) 1100 mL  Tolerated HD Treatment Yes  Post-Hemodialysis Comments tx complete (Treatment uneventful. Patient experienced asymptomatic hypotension which resolved with reduced goal and saline administration. No verbalized concerns. Patient is stable to return to assigned floor.)  AVG/AVF Arterial Site Held (minutes) 0 minutes  AVG/AVF Venous Site Held (minutes) 0 minutes  Fistula / Graft Right Forearm Arteriovenous vein graft  Placement Date/Time: 05/17/23 (c) 1237   Orientation: Right  Access Location: Forearm  Access Type: Arteriovenous vein graft  Site Condition No complications  Drainage Description None

## 2024-08-25 NOTE — Plan of Care (Signed)
  Problem: Health Behavior/Discharge Planning: Goal: Ability to manage health-related needs will improve Outcome: Not Progressing  ------------------------------------------------------------------------------  Problem: Education: Goal: Knowledge of General Education information will improve Description: Including pain rating scale, medication(s)/side effects and non-pharmacologic comfort measures Outcome: Progressing   Problem: Clinical Measurements: Goal: Ability to maintain clinical measurements within normal limits will improve Outcome: Progressing Goal: Will remain free from infection Outcome: Progressing Goal: Diagnostic test results will improve Outcome: Progressing Goal: Respiratory complications will improve Outcome: Progressing Goal: Cardiovascular complication will be avoided Outcome: Progressing   Problem: Activity: Goal: Risk for activity intolerance will decrease Outcome: Progressing   Problem: Nutrition: Goal: Adequate nutrition will be maintained Outcome: Progressing   Problem: Coping: Goal: Level of anxiety will decrease Outcome: Progressing   Problem: Elimination: Goal: Will not experience complications related to bowel motility Outcome: Progressing Goal: Will not experience complications related to urinary retention Outcome: Progressing   Problem: Pain Managment: Goal: General experience of comfort will improve and/or be controlled Outcome: Not Progressing   Problem: Safety: Goal: Ability to remain free from injury will improve Outcome: Progressing   Problem: Skin Integrity: Goal: Risk for impaired skin integrity will decrease Outcome: Progressing

## 2024-08-25 NOTE — Progress Notes (Signed)
 [facility]  ROUNDING NOTE   Subjective:   Patient is known to our practice and receives outpatient dialysis treatments at [facility] on a MWF schedule, supervised by Dr. Marcelino.  Update: Patient seen and evaluated on dialysis. Verbal consent with son over the phone since patient cannot move right arm to sign. No issues with dialysis. Patient agitated with loss of function to right hand.  Update:  Patient seen and evaluated during dialysis   HEMODIALYSIS FLOWSHEET:  Blood Flow Rate (mL/min): 400 mL/min Arterial Pressure (mmHg): -191.5 mmHg Venous Pressure (mmHg): 206.45 mmHg TMP (mmHg): 6.26 mmHg Ultrafiltration Rate (mL/min): 686 mL/min Dialysate Flow Rate (mL/min): 300 ml/min Dialysis Fluid Bolus: Normal Saline Bolus Amount (mL): 100 mL  Reports back has improved some  Generalized complaints about right arm weakness   Objective:  Vital signs in last 24 hours:  Temp:  [97.5 F (36.4 C)-97.7 F (36.5 C)] 97.5 F (36.4 C) (12/01 0806) Pulse Rate:  [78-88] 87 (12/01 0900) Resp:  [14-24] 15 (12/01 0900) BP: (118-161)/(60-70) 129/70 (12/01 0900) SpO2:  [98 %-100 %] 100 % (12/01 0900) Weight:  [72.5 kg] 72.5 kg (12/01 0806)  Weight change:  Filed Weights   08/22/24 1137 08/22/24 1149 08/25/24 0806  Weight: 73.3 kg 73.4 kg 72.5 kg    Intake/Output: No intake/output data recorded.   Intake/Output this shift:  No intake/output data recorded.  Physical Exam: General: NAD  Head: Normocephalic  Lungs:  Clear, room air  Heart: Regular   Abdomen:  Soft  Extremities: No peripheral edema.   Neurologic: Alert, awake, conversant  Skin: dry  Access: Rt chest Permcath    Basic Metabolic Panel: Recent Labs  Lab 08/19/24 0800 08/22/24 0800  NA 130* 128*  K 4.6 4.5  CL 90* 87*  CO2 23 23  GLUCOSE 112* 79  BUN 33* 38*  CREATININE 5.33* 6.79*  CALCIUM  8.4* 8.4*  PHOS 6.4* 5.7*    Liver Function Tests: Recent Labs  Lab 08/19/24 0800  08/22/24 0800  ALBUMIN  4.0 4.0   No results for input(s): LIPASE, AMYLASE in the last 168 hours. No results for input(s): AMMONIA in the last 168 hours.  CBC: Recent Labs  Lab 08/19/24 0800 08/22/24 0800  WBC 9.0 8.1  NEUTROABS  --  5.7  HGB 10.6* 10.7*  HCT 32.7* 32.8*  MCV 84.1 84.1  PLT 230 300    Cardiac Enzymes: No results for input(s): CKTOTAL, CKMB, CKMBINDEX, TROPONINI in the last 168 hours.  BNP: Invalid input(s): POCBNP  CBG: Recent Labs  Lab 08/20/24 1226 08/20/24 1616 08/21/24 0757 08/22/24 1233 08/22/24 1619  GLUCAP 112* 117* 88 83 103*    Microbiology: Results for orders placed or performed during the hospital encounter of 01/20/22  Blood culture (routine x 2)     Status: None   Collection Time: 01/20/22  7:22 PM   Specimen: BLOOD  Result Value Ref Range Status   Specimen Description BLOOD BLOOD RIGHT HAND  Final   Special Requests   Final    BOTTLES DRAWN AEROBIC AND ANAEROBIC Blood Culture results may not be optimal due to an inadequate volume of blood received in culture bottles   Culture   Final    NO GROWTH 5 DAYS Performed at [facility], [address], [place], [place] [address]    Report Status 01/25/2022 FINAL  Final  Blood culture (routine x 2)     Status: None   Collection Time: 01/20/22  7:22 PM   Specimen: BLOOD  Result Value Ref Range Status   Specimen Description BLOOD BLOOD LEFT HAND  Final   Special Requests   Final    BOTTLES DRAWN AEROBIC AND ANAEROBIC Blood Culture results may not be optimal due to an inadequate volume of blood received in culture bottles   Culture   Final    NO GROWTH 5 DAYS Performed at [facility], [address] [place], [place] [address]    Report Status 01/25/2022 FINAL  Final  Resp Panel by RT-PCR (Flu A&B, Covid) Nasopharyngeal Swab     Status: None   Collection Time: 01/20/22  7:28 PM   Specimen: Nasopharyngeal Swab; Nasopharyngeal(NP) swabs in vial  transport medium  Result Value Ref Range Status   SARS Coronavirus 2 by RT PCR NEGATIVE NEGATIVE Final    Comment: (NOTE) SARS-CoV-2 target nucleic acids are NOT DETECTED.  The SARS-CoV-2 RNA is generally detectable in upper respiratory specimens during the acute phase of infection. The lowest concentration of SARS-CoV-2 viral copies this assay can detect is 138 copies/mL. A negative result does not preclude SARS-Cov-2 infection and should not be used as the sole basis for treatment or other patient management decisions. A negative result may occur with  improper specimen collection/handling, submission of specimen other than nasopharyngeal swab, presence of viral mutation(s) within the areas targeted by this assay, and inadequate number of viral copies(<138 copies/mL). A negative result must be combined with clinical observations, patient history, and epidemiological information. The expected result is Negative.  Fact Sheet for Patients:  bloggercourse.com  Fact Sheet for Healthcare Providers:  seriousbroker.it  This test is no t yet approved or cleared by the [place]  FDA and  has been authorized for detection and/or diagnosis of SARS-CoV-2 by FDA under an Emergency Use Authorization (EUA). This EUA will remain  in effect (meaning this test can be used) for the duration of the COVID-19 declaration under Section 564(b)(1) of the Act, 21 U.S.C.section 360bbb-3(b)(1), unless the authorization is terminated  or revoked sooner.       Influenza A by PCR NEGATIVE NEGATIVE Final   Influenza B by PCR NEGATIVE NEGATIVE Final    Comment: (NOTE) The Xpert Xpress SARS-CoV-2/FLU/RSV plus assay is intended as an aid in the diagnosis of influenza from Nasopharyngeal swab specimens and should not be used as a sole basis for treatment. Nasal washings and aspirates are unacceptable for Xpert Xpress SARS-CoV-2/FLU/RSV testing.  Fact  Sheet for Patients: bloggercourse.com  Fact Sheet for Healthcare Providers: seriousbroker.it  This test is not yet approved or cleared by the [place]  FDA and has been authorized for detection and/or diagnosis of SARS-CoV-2 by FDA under an Emergency Use Authorization (EUA). This EUA will remain in effect (meaning this test can be used) for the duration of the COVID-19 declaration under Section 564(b)(1) of the Act, 21 U.S.C. section 360bbb-3(b)(1), unless the authorization is terminated or revoked.  Performed at [facility], [address] [place], [place] [address]     Coagulation Studies: No results for input(s): LABPROT, INR in the last 72 hours.   Urinalysis: No results for input(s): COLORURINE, LABSPEC, PHURINE, GLUCOSEU, HGBUR, BILIRUBINUR, KETONESUR, PROTEINUR, UROBILINOGEN, NITRITE, LEUKOCYTESUR in the last 72 hours.  Invalid input(s): APPERANCEUR    Imaging: No results found.    Medications:     aspirin  EC  81 mg Oral Daily   atorvastatin   40 mg Oral Daily   calcium  acetate  667 mg Oral TID WC   Chlorhexidine  Gluconate Cloth  6 each Topical  [id number]   clopidogrel   75 mg Oral Daily   heparin  injection (subcutaneous)  5,000 Units Subcutaneous Q8H   pantoprazole   40 mg Oral QHS   sertraline   100 mg Oral Daily   sodium chloride  flush  3 mL Intravenous Once   acetaminophen  **OR** acetaminophen  (TYLENOL ) oral liquid 160 mg/5 mL **OR** acetaminophen , ammonium lactate, diclofenac Sodium, ondansetron  (ZOFRAN ) IV, senna-docusate, traMADol  Assessment/ Plan:  [name] is a 73 y.o.  female with a past medical history of ESRD on dialysis, CVA, coronary artery disease, status post PCI, CHF with reduced ejection fraction, diabetes mellitus, and hypertension, Patient presents to the ED from her dialysis center with acute onset of slurred speech and facial droop there. She  was also experiencing worsened weakness/numbness of the right upper extremity relative to her baseline. Code Stroke was called.    [facility]/MWF/Rt PC/74.0 kg   End-stage renal disease on hemodialysis.   Receiving dialysis today, UF goal 1-1.5L as tolerated. Next treatment scheduled for Wednesday.    Acute versus subacute ischemic CVA anterior left temporal lobe status post tenecteplase  administration.  History of recurrent CVA. Loop recorder placed on 08/08/23. Prescribed aspirin  and Plavix  outpatient. CT head-likely acute or subacute nonhemorrhagic infarct in the anterior left temporal lobe measuring up to moderate confluent subcortical white matter hypoattenuation stable.  MRI brain- Acute nonhemorrhagic infarct in the posterior left frontal lobe involving the primary motor cortex, with associated T2/FLAIR hyperintensity. Confluent periventricular and subcortical T2 hyperintensities bilaterally, increased from the prior study. Remote infarcts in the anterior right insular cortex, right frontal lobe operculum, and lateral left temporal lobe are new since the prior exam. Plavix  and aspirin  continued per neurology.  Pending rehab placement  3. Anemia of chronic kidney disease Hemoglobin & Hematocrit     Component Value Date/Time   HGB 10.7 (L) 08/22/2024 0800   HCT 32.8 (L) 08/22/2024 0800    Patient receives Mircera at outpatient clinic.  Will recheck labs with dialysis tomorrow.   4. Secondary Hyperparathyroidism: with outpatient labs: PTH 652, phosphorus 5.2, corr calcium  08/06/2024.  Patient prescribed calcitriol, Cinacalcet and calcium  acetate outpatient.  Update: Will order updated labs with dialysis.  5.  Hypertension chronic kidney disease  Patient currently not on any antihypertensive medications. Blood pressure 161/67 during dialysis       LOS: 10 Hulbert Branscome 12/1/20259:44 AM

## 2024-08-26 ENCOUNTER — Inpatient Hospital Stay

## 2024-08-26 DIAGNOSIS — I639 Cerebral infarction, unspecified: Secondary | ICD-10-CM | POA: Diagnosis not present

## 2024-08-26 MED ORDER — NEPRO/CARBSTEADY PO LIQD
237.0000 mL | Freq: Two times a day (BID) | ORAL | Status: DC
  Administered 2024-08-27 – 2024-09-04 (×9): 237 mL via ORAL

## 2024-08-26 MED ORDER — ONDANSETRON 4 MG PO TBDP
4.0000 mg | ORAL_TABLET | Freq: Three times a day (TID) | ORAL | Status: DC | PRN
  Administered 2024-08-26 – 2024-09-04 (×2): 4 mg via ORAL
  Filled 2024-08-26 (×2): qty 1

## 2024-08-26 NOTE — TOC Progression Note (Addendum)
 Transition of Care [facility]) - Progression Note    Patient Details  Name: Deanna [name] MRN: [id number] Date of Birth: 1951-01-04  [facility] CM/SW Contact  Daved JONETTA Hamilton, RN Phone Number: 08/26/2024, 12:30 PM  Clinical Narrative:     Met with patient and provided choice of bed offers using [email or web address] Patient verbalized her choice as [facility].   [facility] selected in [place], [place] at [facility] notified. Patient is current with [facility] in [place] MWF, [facility] does not transport to [place], patient will need to go to another dialysis facility and maintain MWF schedule with appointments after 9am.  Contacted Suzen Satchel for assistance with dialysis arrangements, she will begin referral process, could take 48-72 hours.   Requested Nitchia with TOC begin prior auth.  Expected Discharge Plan: Skilled Nursing Facility Barriers to Discharge: Insurance Authorization               Expected Discharge Plan and Services   Discharge Planning Services: CM Consult Post Acute Care Choice: Skilled Nursing Facility Living arrangements for the past 2 months: Assisted Living Facility Expected Discharge Date: 08/18/24                                     Social Drivers of Health (SDOH) Interventions SDOH Screenings   Food Insecurity: Patient Declined (08/17/2024)  Housing: Unknown (08/17/2024)  Transportation Needs: Patient Declined (08/17/2024)  Utilities: Patient Declined (08/17/2024)  Depression (PHQ2-9): Low Risk  (06/12/2024)  Financial Resource Strain: Low Risk  (07/22/2024)   Received from [facility]  Social Connections: Patient Declined (08/17/2024)  Tobacco Use: Low Risk  (08/15/2024)    Readmission Risk Interventions    01/16/2022    3:45 PM 12/29/2021    2:33 PM  Readmission Risk Prevention Plan  Transportation Screening Complete Complete  PCP or Specialist Appt within 5-7 Days  Complete   Home Care Screening  Complete  Medication Review (RN CM)  Complete  Medication Review (RN Care Manager) Complete   PCP or Specialist appointment within 3-5 days of discharge Complete   HRI or Home Care Consult Complete   SW Recovery Care/Counseling Consult Complete   Palliative Care Screening Not Applicable   Skilled Nursing Facility Not Applicable

## 2024-08-26 NOTE — Progress Notes (Signed)
 Occupational Therapy Treatment Patient Details Name: Deanna [name] MRN: [id number] DOB: Nov 25, 1950 Today's Date: 08/26/2024   History of present illness [name] is a 73 y.o. female with a past medical history of ESRD on dialysis, CAD, CVA, status post PCI, CHF with reduced ejection fraction, DM, HTN. Patient presents to the ED from her dialysis center with acute onset of slurred speech and facial droop there. She was also experiencing worsened weakness/numbness of the right upper extremity relative to her baseline. Found to have acute left frontal lobe nonhemorrhagic stroke: S/p tenecteplase  on 08/15/2024. Follow-up MRI brain showed acute nonhemorrhagic infarct in the posterior left frontal lobe.   OT comments  Patient seen for OT treatment on this date. Upon arrival to room patient refusing PT/OT, reporting pain 9/10 in R hip, refusing to allow PT/OT to reposition patient, provide stretching/massage or sit EOB, she was agreeable to ROM to RUE, PT left tx as patient refused any intervention. See below for therex/NDT to RUE with minimal engagement from patient through entire tx session. OT educated patient on importance of daily participation with therapy to maximize rehab potential with patient stating understanding however several charted refusals this hospitalization. Patient ended treatment in bed with bed/chair alarm on and all needs within reach. Patient making limited progress toward goals, will continue to follow POC. Discharge recommendation remains appropriate.        If plan is discharge home, recommend the following:  Assist for transportation;Direct supervision/assist for financial management;Direct supervision/assist for medications management;Assistance with cooking/housework;Two people to help with walking and/or transfers;Two people to help with bathing/dressing/bathroom   Equipment Recommendations  None recommended by OT    Recommendations for Other Services       Precautions / Restrictions Precautions Precautions: Fall Recall of Precautions/Restrictions: Impaired Restrictions Weight Bearing Restrictions Per Provider Order: No       Mobility Bed Mobility               General bed mobility comments: refused    Transfers                   General transfer comment: refused     Balance Overall balance assessment: Needs assistance                                         ADL either performed or assessed with clinical judgement   ADL                                              Extremity/Trunk Assessment Upper Extremity Assessment Upper Extremity Assessment: RUE deficits/detail RUE Deficits / Details: pt has scapular elevation/depression/protraction/retraction. no active grip, able to supinate but limited pronation, improved activation at tricep and noted bicep activation with elbow flexion RUE Sensation: decreased light touch RUE Coordination: decreased fine motor;decreased gross motor            Vision       Perception     Praxis     Communication Communication Communication: No apparent difficulties   Cognition Arousal: Alert Behavior During Therapy: Flat affect Cognition: No family/caregiver present to determine baseline  Following commands: Intact        Cueing   Cueing Techniques: Verbal cues, Gestural cues, Tactile cues, Visual cues  Exercises Exercises: Other exercises General Exercises - Upper Extremity Shoulder Flexion: PROM Shoulder Extension: PROM Shoulder ABduction: PROM Shoulder ADduction: PROM Elbow Flexion: AAROM, 10 reps, Supine Elbow Extension: AAROM, 10 reps, Supine Wrist Flexion: 10 reps, PROM, AROM, Supine Wrist Extension: 10 reps, Right, PROM, Supine    Shoulder Instructions       General Comments      Pertinent Vitals/ Pain       Pain Assessment Pain Assessment: 0-10 Pain Score: 9  Pain  Location: L hip, both knees Pain Descriptors / Indicators: Grimacing, Guarding, Discomfort Pain Intervention(s): Limited activity within patient's tolerance, Monitored during session  Home Living                                          Prior Functioning/Environment              Frequency  Min 2X/week        Progress Toward Goals  OT Goals(current goals can now be found in the care plan section)  Progress towards OT goals: Not progressing toward goals - comment (limited partiicipation)  Acute Rehab OT Goals Patient Stated Goal: to be left alone OT Goal Formulation: With patient Time For Goal Achievement: 08/31/24 Potential to Achieve Goals: Good ADL Goals Pt Will Perform Grooming: sitting;with set-up Pt Will Perform Lower Body Dressing: sitting/lateral leans;with min assist Pt Will Transfer to Toilet: ambulating;with mod assist Pt Will Perform Toileting - Clothing Manipulation and hygiene: sit to/from stand;with mod assist  Plan      Co-evaluation                 AM-PAC OT 6 Clicks Daily Activity     Outcome Measure   Help from another person eating meals?: A Little Help from another person taking care of personal grooming?: A Lot Help from another person toileting, which includes using toliet, bedpan, or urinal?: A Lot Help from another person bathing (including washing, rinsing, drying)?: A Lot Help from another person to put on and taking off regular upper body clothing?: A Little Help from another person to put on and taking off regular lower body clothing?: A Lot 6 Click Score: 14    End of Session    OT Visit Diagnosis: Unsteadiness on feet (R26.81);Other abnormalities of gait and mobility (R26.89);Repeated falls (R29.6);Muscle weakness (generalized) (M62.81);Hemiplegia and hemiparesis Hemiplegia - Right/Left: Right Hemiplegia - dominant/non-dominant: Dominant Hemiplegia - caused by: Cerebral infarction   Activity Tolerance  Patient limited by pain   Patient Left in bed;with call bell/phone within reach;with bed alarm set   Nurse Communication Other (comment) (OT provided voltaren gel)        Time: 8676-8661 OT Time Calculation (min): 15 min  Charges: OT General Charges $OT Visit: 1 Visit OT Treatments $Neuromuscular Re-education: 8-22 mins  Rogers Clause, OT/L MSOT, 08/26/2024

## 2024-08-26 NOTE — Progress Notes (Signed)
 PROGRESS NOTE    Deanna [name]   FMW:[id number] DOB: [birth year]  DOA: 08/15/2024 Date of Service: 08/26/24 which is hospital day 11  PCP: Brutus Delon SAUNDERS, NP    Hospital course / significant events:   Scotty Holdman is a 73 y.o. female with medical history significant for CVA, ESRD on HD, extensive PAD s/p angioplasty and stent, history of HFrEF with recovered EF, hypertension, hyperlipidemia, type 2 diabetes, CAD, DISH arthritis, C3 anterior osteophyte fracture, was brought from the outpatient dialysis center to the ED because of right facial droop and slurred speech.  She also complained of numbness and weakness in the right upper extremity.   11/21:She was by the neurologist for acute stroke.  She was given TNK at 3:15 PM on 08/15/2024. Admitted to ICU  11/22: stable, neuro s/o  11/23: PT/OT recs for SNF 11/24 - 12/02: awaiting SNF placement. Pt was going to [facility] but see TOC note, they have rescinded their offer, we have been waiting to hear back from alternate facility, now pending auth for [facility]   Consultants:  Neurology  Procedures/Surgeries: none      ASSESSMENT & PLAN:   Acute left frontal lobe nonhemorrhagic stroke S/p tenecteplase  on 08/15/2024 at 3:15 PM. Follow-up MRI brain showed acute nonhemorrhagic infarct in the posterior left frontal lobe. Limited 2D echo view because patient was not cooperative. Continue aspirin , Plavix  and Lipitor. PT OT recommended discharge to SNF.   ESRD on HD Follow-up with nephrologist for hemodialysis.    Type II DM well controlled Glc have been appropriate, have dc Glc checks q4 and hold further insulin   Monitor periodic BMP / POC Glc prn suspect hypoglycemia   Comorbidities include hyperlipidemia, hypertension, PAD s/p stent, CAD, prior history of strokes, history of C3 anterior osteophyte fracture.   She is medically stable for discharge.  Awaiting placement to SNF.  Follow-up with TOC for disposition.   Class  1 obesity based on BMI: Body mass index is 29.58 kg/m.SABRA Significantly low or high BMI is associated with higher medical risk.  Underweight - under 18  overweight - 25 to 29 obese - 30 or more Class 1 obesity: BMI of 30.0 to 34 Class 2 obesity: BMI of 35.0 to 39 Class 3 obesity: BMI of 40.0 to 49 Super Morbid Obesity: BMI 50-59 Super-super Morbid Obesity: BMI 60+ Healthy nutrition and physical activity advised as adjunct to other disease management and risk reduction treatments    DVT prophylaxis: heparin  IV fluids: no continuous IV fluids  Nutrition: cardiac diet  Central lines / other devices: none  Code Status: FULL CODE ACP documentation reviewed: Advanced directive is on file in [facility]  [facility] needs: placement Medical barriers to dispo: none.              Subjective / Brief ROS:  No concerns pt states voltaren gel is helping her arthritis, otherwise no problems today and asking about placement    Family Communication: none at this time     Objective Findings:  Vitals:   08/25/24 1558 08/25/24 2014 08/26/24 0512 08/26/24 0758  BP: 109/64 (!) 91/47 (!) 99/52 (!) 85/53  Pulse: 97 89 88 94  Resp: 14 16 14 17   Temp: 98 F (36.7 C) 97.7 F (36.5 C) 98.5 F (36.9 C) 98.3 F (36.8 C)  TempSrc: Oral Oral Oral   SpO2: 100% 100% 100% 100%  Weight:      Height:        Intake/Output Summary (Last 24 hours) at 08/26/2024 1536  Last data filed at 08/26/2024 1013 Gross per 24 hour  Intake 0 ml  Output --  Net 0 ml    Filed Weights   08/22/24 1149 08/25/24 0806 08/25/24 1251  Weight: 73.4 kg 72.5 kg 71.4 kg    Examination:  Physical Exam Constitutional:      General: She is not in acute distress. Cardiovascular:     Rate and Rhythm: Normal rate and regular rhythm.  Pulmonary:     Effort: Pulmonary effort is normal.     Breath sounds: Normal breath sounds.  Neurological:     Mental Status: She is alert and oriented to person, place, and time. Mental  status is at baseline.          Scheduled Medications:   aspirin  EC  81 mg Oral Daily   atorvastatin   40 mg Oral Daily   calcium  acetate  667 mg Oral TID WC   Chlorhexidine  Gluconate Cloth  6 each Topical Q0600   clopidogrel   75 mg Oral Daily   feeding supplement (NEPRO CARB STEADY)  237 mL Oral BID BM   heparin  injection (subcutaneous)  5,000 Units Subcutaneous Q8H   pantoprazole   40 mg Oral QHS   sertraline   100 mg Oral Daily   sodium chloride  flush  3 mL Intravenous Once    Continuous Infusions:   PRN Medications:  acetaminophen  **OR** acetaminophen  (TYLENOL ) oral liquid 160 mg/5 mL **OR** acetaminophen , ammonium lactate, diclofenac Sodium, ondansetron  (ZOFRAN ) IV, ondansetron , senna-docusate, traMADol  Antimicrobials from admission:  Anti-infectives (From admission, onward)    None           Data Reviewed:  I have personally reviewed the following...  CBC: Recent Labs  Lab 08/22/24 0800 08/25/24 1115  WBC 8.1 7.1  NEUTROABS 5.7 4.8  HGB 10.7* 11.3*  HCT 32.8* 35.3*  MCV 84.1 84.2  PLT 300 350   Basic Metabolic Panel: Recent Labs  Lab 08/22/24 0800 08/25/24 0940  NA 128* 130*  K 4.5 4.9  CL 87* 90*  CO2 23 24  GLUCOSE 79 92  BUN 38* 43*  CREATININE 6.79* 6.84*  CALCIUM  8.4* 8.8*  PHOS 5.7* 6.2*   GFR: Estimated Creatinine Clearance: 6.7 mL/min (A) (by C-G formula based on SCr of 6.84 mg/dL (H)). Liver Function Tests: Recent Labs  Lab 08/22/24 0800 08/25/24 0940  ALBUMIN  4.0 4.1   No results for input(s): LIPASE, AMYLASE in the last 168 hours. No results for input(s): AMMONIA in the last 168 hours. Coagulation Profile: No results for input(s): INR, PROTIME in the last 168 hours.  Cardiac Enzymes: No results for input(s): CKTOTAL, CKMB, CKMBINDEX, TROPONINI in the last 168 hours. BNP (last 3 results) No results for input(s): PROBNP in the last 8760 hours. HbA1C: No results for input(s): HGBA1C in the last  72 hours. CBG: Recent Labs  Lab 08/20/24 1226 08/20/24 1616 08/21/24 0757 08/22/24 1233 08/22/24 1619  GLUCAP 112* 117* 88 83 103*   Lipid Profile: No results for input(s): CHOL, HDL, LDLCALC, TRIG, CHOLHDL, LDLDIRECT in the last 72 hours. Thyroid Function Tests: No results for input(s): TSH, T4TOTAL, FREET4, T3FREE, THYROIDAB in the last 72 hours. Anemia Panel: No results for input(s): VITAMINB12, FOLATE, FERRITIN, TIBC, IRON, RETICCTPCT in the last 72 hours. Most Recent Urinalysis On File:     Component Value Date/Time   COLORURINE YELLOW (A) 12/26/2021 1621   APPEARANCEUR HAZY (A) 12/26/2021 1621   LABSPEC 1.011 12/26/2021 1621   PHURINE 5.0 12/26/2021 1621   GLUCOSEU 150 (A)  12/26/2021 1621   HGBUR SMALL (A) 12/26/2021 1621   BILIRUBINUR NEGATIVE 12/26/2021 1621   KETONESUR NEGATIVE 12/26/2021 1621   PROTEINUR 100 (A) 12/26/2021 1621   NITRITE NEGATIVE 12/26/2021 1621   LEUKOCYTESUR TRACE (A) 12/26/2021 1621   Sepsis Labs: @LABRCNTIP (procalcitonin:4,lacticidven:4) Microbiology: No results found for this or any previous visit (from the past 240 hours).    Radiology Studies last 3 days: No results found.         Bobie Kistler, DO [facility] 08/26/2024, 3:36 PM    Dictation software may have been used to generate the above note. Typos may occur and escape review in typed/dictated notes. Please contact Dr Marsa directly for clarity if needed.  Staff may message me via secure chat in Epic  but this may not receive an immediate response,  please page me for urgent matters!  If 7PM-7AM, please contact night coverage [email or web address]

## 2024-08-26 NOTE — Plan of Care (Signed)

## 2024-08-26 NOTE — Progress Notes (Signed)
 [facility]  ROUNDING NOTE   Subjective:   Patient is known to our practice and receives outpatient dialysis treatments at [facility] on a MWF schedule, supervised by Dr. Marcelino.  Update: Patient seen and evaluated on dialysis. Verbal consent with son over the phone since patient cannot move right arm to sign. No issues with dialysis. Patient agitated with loss of function to right hand.  Update:   Patient seen sitting up in chair Had complained of nausea earlier, received antiemetic Room air   Objective:  Vital signs in last 24 hours:  Temp:  [97.7 F (36.5 C)-98.5 F (36.9 C)] 98.3 F (36.8 C) (12/02 0758) Pulse Rate:  [88-100] 94 (12/02 0758) Resp:  [14-28] 17 (12/02 0758) BP: (85-115)/(47-66) 85/53 (12/02 0758) SpO2:  [100 %] 100 % (12/02 0758) Weight:  [71.4 kg] 71.4 kg (12/01 1251)  Weight change:  Filed Weights   08/22/24 1149 08/25/24 0806 08/25/24 1251  Weight: 73.4 kg 72.5 kg 71.4 kg    Intake/Output: I/O last 3 completed shifts: In: 0  Out: 1100 [Other:1100]   Intake/Output this shift:  No intake/output data recorded.  Physical Exam: General: NAD, sitting in chair  Head: Normocephalic  Lungs:  Clear, room air  Heart: Regular   Abdomen:  Soft  Extremities: No peripheral edema.   Neurologic: Alert, awake, conversant  Skin: dry  Access: Rt chest Permcath    Basic Metabolic Panel: Recent Labs  Lab 08/22/24 0800 08/25/24 0940  NA 128* 130*  K 4.5 4.9  CL 87* 90*  CO2 23 24  GLUCOSE 79 92  BUN 38* 43*  CREATININE 6.79* 6.84*  CALCIUM  8.4* 8.8*  PHOS 5.7* 6.2*    Liver Function Tests: Recent Labs  Lab 08/22/24 0800 08/25/24 0940  ALBUMIN  4.0 4.1   No results for input(s): LIPASE, AMYLASE in the last 168 hours. No results for input(s): AMMONIA in the last 168 hours.  CBC: Recent Labs  Lab 08/22/24 0800 08/25/24 1115  WBC 8.1 7.1  NEUTROABS 5.7 4.8  HGB 10.7* 11.3*  HCT 32.8* 35.3*  MCV 84.1 84.2  PLT  300 350    Cardiac Enzymes: No results for input(s): CKTOTAL, CKMB, CKMBINDEX, TROPONINI in the last 168 hours.  BNP: Invalid input(s): POCBNP  CBG: Recent Labs  Lab 08/20/24 1226 08/20/24 1616 08/21/24 0757 08/22/24 1233 08/22/24 1619  GLUCAP 112* 117* 88 83 103*    Microbiology: Results for orders placed or performed during the hospital encounter of 01/20/22  Blood culture (routine x 2)     Status: None   Collection Time: 01/20/22  7:22 PM   Specimen: BLOOD  Result Value Ref Range Status   Specimen Description BLOOD BLOOD RIGHT HAND  Final   Special Requests   Final    BOTTLES DRAWN AEROBIC AND ANAEROBIC Blood Culture results may not be optimal due to an inadequate volume of blood received in culture bottles   Culture   Final    NO GROWTH 5 DAYS Performed at [facility], [address] [place], [place] [address]    Report Status 01/25/2022 FINAL  Final  Blood culture (routine x 2)     Status: None   Collection Time: 01/20/22  7:22 PM   Specimen: BLOOD  Result Value Ref Range Status   Specimen Description BLOOD BLOOD LEFT HAND  Final   Special Requests   Final    BOTTLES DRAWN AEROBIC AND ANAEROBIC Blood Culture results may not be optimal due to an inadequate  volume of blood received in culture bottles   Culture   Final    NO GROWTH 5 DAYS Performed at [facility], [address] [place], [place] [address]    Report Status 01/25/2022 FINAL  Final  Resp Panel by RT-PCR (Flu A&B, Covid) Nasopharyngeal Swab     Status: None   Collection Time: 01/20/22  7:28 PM   Specimen: Nasopharyngeal Swab; Nasopharyngeal(NP) swabs in vial transport medium  Result Value Ref Range Status   SARS Coronavirus 2 by RT PCR NEGATIVE NEGATIVE Final    Comment: (NOTE) SARS-CoV-2 target nucleic acids are NOT DETECTED.  The SARS-CoV-2 RNA is generally detectable in upper respiratory specimens during the acute phase of infection. The lowest concentration  of SARS-CoV-2 viral copies this assay can detect is 138 copies/mL. A negative result does not preclude SARS-Cov-2 infection and should not be used as the sole basis for treatment or other patient management decisions. A negative result may occur with  improper specimen collection/handling, submission of specimen other than nasopharyngeal swab, presence of viral mutation(s) within the areas targeted by this assay, and inadequate number of viral copies(<138 copies/mL). A negative result must be combined with clinical observations, patient history, and epidemiological information. The expected result is Negative.  Fact Sheet for Patients:  bloggercourse.com  Fact Sheet for Healthcare Providers:  seriousbroker.it  This test is no t yet approved or cleared by the [place]  FDA and  has been authorized for detection and/or diagnosis of SARS-CoV-2 by FDA under an Emergency Use Authorization (EUA). This EUA will remain  in effect (meaning this test can be used) for the duration of the COVID-19 declaration under Section 564(b)(1) of the Act, 21 U.S.C.section 360bbb-3(b)(1), unless the authorization is terminated  or revoked sooner.       Influenza A by PCR NEGATIVE NEGATIVE Final   Influenza B by PCR NEGATIVE NEGATIVE Final    Comment: (NOTE) The Xpert Xpress SARS-CoV-2/FLU/RSV plus assay is intended as an aid in the diagnosis of influenza from Nasopharyngeal swab specimens and should not be used as a sole basis for treatment. Nasal washings and aspirates are unacceptable for Xpert Xpress SARS-CoV-2/FLU/RSV testing.  Fact Sheet for Patients: bloggercourse.com  Fact Sheet for Healthcare Providers: seriousbroker.it  This test is not yet approved or cleared by the [place]  FDA and has been authorized for detection and/or diagnosis of SARS-CoV-2 by FDA under an Emergency Use  Authorization (EUA). This EUA will remain in effect (meaning this test can be used) for the duration of the COVID-19 declaration under Section 564(b)(1) of the Act, 21 U.S.C. section 360bbb-3(b)(1), unless the authorization is terminated or revoked.  Performed at [facility], [address] [place], [place] [address]     Coagulation Studies: No results for input(s): LABPROT, INR in the last 72 hours.   Urinalysis: No results for input(s): COLORURINE, LABSPEC, PHURINE, GLUCOSEU, HGBUR, BILIRUBINUR, KETONESUR, PROTEINUR, UROBILINOGEN, NITRITE, LEUKOCYTESUR in the last 72 hours.  Invalid input(s): APPERANCEUR    Imaging: No results found.    Medications:     aspirin  EC  81 mg Oral Daily   atorvastatin   40 mg Oral Daily   calcium  acetate  667 mg Oral TID WC   Chlorhexidine  Gluconate Cloth  6 each Topical Q0600   clopidogrel   75 mg Oral Daily   feeding supplement (NEPRO CARB STEADY)  237 mL Oral BID BM   heparin  injection (subcutaneous)  5,000 Units Subcutaneous Q8H   pantoprazole   40 mg Oral QHS  sertraline   100 mg Oral Daily   sodium chloride  flush  3 mL Intravenous Once   acetaminophen  **OR** acetaminophen  (TYLENOL ) oral liquid 160 mg/5 mL **OR** acetaminophen , ammonium lactate , diclofenac  Sodium, ondansetron  (ZOFRAN ) IV, ondansetron , senna-docusate, traMADol   Assessment/ Plan:  [name] is a 73 y.o.  female with a past medical history of ESRD on dialysis, CVA, coronary artery disease, status post PCI, CHF with reduced ejection fraction, diabetes mellitus, and hypertension, Patient presents to the ED from her dialysis center with acute onset of slurred speech and facial droop there. She was also experiencing worsened weakness/numbness of the right upper extremity relative to her baseline. Code Stroke was called.    [facility]/MWF/Rt PC/74.0 kg   End-stage renal disease on hemodialysis.   Received dialysis  yesterday, UF 1.1L achieved. Next treatment scheduled for Wednesday.    Acute versus subacute ischemic CVA anterior left temporal lobe status post tenecteplase  administration.  History of recurrent CVA. Loop recorder placed on 08/08/23. Prescribed aspirin  and Plavix  outpatient. CT head-likely acute or subacute nonhemorrhagic infarct in the anterior left temporal lobe measuring up to moderate confluent subcortical white matter hypoattenuation stable.  MRI brain- Acute nonhemorrhagic infarct in the posterior left frontal lobe involving the primary motor cortex, with associated T2/FLAIR hyperintensity. Confluent periventricular and subcortical T2 hyperintensities bilaterally, increased from the prior study. Remote infarcts in the anterior right insular cortex, right frontal lobe operculum, and lateral left temporal lobe are new since the prior exam. Plavix  and aspirin  continued per neurology.  Pending rehab placement  3. Anemia of chronic kidney disease Hemoglobin & Hematocrit     Component Value Date/Time   HGB 11.3 (L) 08/25/2024 1115   HCT 35.3 (L) 08/25/2024 1115    Patient receives Mircera at outpatient clinic.  Hgb remains stable   4. Secondary Hyperparathyroidism: with outpatient labs: PTH 652, phosphorus 5.2, corr calcium  08/06/2024.  Patient prescribed calcitriol, Cinacalcet and calcium  acetate outpatient.  Update: no new labs today. Will monitor am labs  5.  Hypertension chronic kidney disease  Patient currently not on any antihypertensive medications. Blood pressure soft today       LOS: 11 T[place]on Ballow 12/2/202512:09 PM

## 2024-08-26 NOTE — Progress Notes (Signed)
 PT Cancellation Note  Patient Details Name: Deanna [name] MRN: [id number] DOB: 1951-09-14   Cancelled Treatment:    Reason Eval/Treat Not Completed: Other (comment) Pt received in bed, barely opening eyes or communicating with PT/OT. Pt reports 9/10 R hip pain, declines repositioning in bed, sitting EOB, all mobility efforts. Pt agreeable to PROM to RUE. Therapists educated pt on importance of mobility but pt still declined. Pt left in bed with OT in room. Will f/u as able.  Richerd Pinal, PT, DPT 08/26/24, 1:29 PM   [name] 08/26/2024, 1:28 PM

## 2024-08-27 DIAGNOSIS — I639 Cerebral infarction, unspecified: Secondary | ICD-10-CM | POA: Diagnosis not present

## 2024-08-27 LAB — CBC WITH DIFFERENTIAL/PLATELET
Abs Immature Granulocytes: 0.06 K/uL (ref 0.00–0.07)
Basophils Absolute: 0.1 K/uL (ref 0.0–0.1)
Basophils Relative: 1 %
Eosinophils Absolute: 0.1 K/uL (ref 0.0–0.5)
Eosinophils Relative: 1 %
HCT: 35.6 % — ABNORMAL LOW (ref 36.0–46.0)
Hemoglobin: 11.1 g/dL — ABNORMAL LOW (ref 12.0–15.0)
Immature Granulocytes: 1 %
Lymphocytes Relative: 20 %
Lymphs Abs: 1.9 K/uL (ref 0.7–4.0)
MCH: 26.8 pg (ref 26.0–34.0)
MCHC: 31.2 g/dL (ref 30.0–36.0)
MCV: 86 fL (ref 80.0–100.0)
Monocytes Absolute: 0.8 K/uL (ref 0.1–1.0)
Monocytes Relative: 9 %
Neutro Abs: 6.4 K/uL (ref 1.7–7.7)
Neutrophils Relative %: 68 %
Platelets: 383 K/uL (ref 150–400)
RBC: 4.14 MIL/uL (ref 3.87–5.11)
RDW: 16.5 % — ABNORMAL HIGH (ref 11.5–15.5)
WBC: 9.2 K/uL (ref 4.0–10.5)
nRBC: 0 % (ref 0.0–0.2)

## 2024-08-27 LAB — RENAL FUNCTION PANEL
Albumin: 4 g/dL (ref 3.5–5.0)
Anion gap: 18 — ABNORMAL HIGH (ref 5–15)
BUN: 32 mg/dL — ABNORMAL HIGH (ref 8–23)
CO2: 23 mmol/L (ref 22–32)
Calcium: 9.4 mg/dL (ref 8.9–10.3)
Chloride: 91 mmol/L — ABNORMAL LOW (ref 98–111)
Creatinine, Ser: 6.11 mg/dL — ABNORMAL HIGH (ref 0.44–1.00)
GFR, Estimated: 7 mL/min — ABNORMAL LOW (ref >60)
Glucose, Bld: 76 mg/dL (ref 70–99)
Phosphorus: 5.7 mg/dL — ABNORMAL HIGH (ref 2.5–4.6)
Potassium: 4.3 mmol/L (ref 3.5–5.1)
Sodium: 132 mmol/L — ABNORMAL LOW (ref 135–145)

## 2024-08-27 MED ORDER — ONDANSETRON HCL 4 MG/2ML IJ SOLN
INTRAMUSCULAR | Status: AC
  Filled 2024-08-27: qty 2

## 2024-08-27 MED ORDER — HEPARIN SODIUM (PORCINE) 1000 UNIT/ML IJ SOLN
INTRAMUSCULAR | Status: AC
  Filled 2024-08-27: qty 4

## 2024-08-27 MED ORDER — ALTEPLASE 2 MG IJ SOLR: 2.0000 mg | Freq: Once | INTRAMUSCULAR | Status: DC | PRN

## 2024-08-27 MED ORDER — HEPARIN SODIUM (PORCINE) 1000 UNIT/ML DIALYSIS
1000.0000 [IU] | INTRAMUSCULAR | Status: DC | PRN
  Administered 2024-08-27: 3200 [IU]

## 2024-08-27 NOTE — Plan of Care (Signed)

## 2024-08-27 NOTE — Progress Notes (Signed)
 Contacted by [facility] regarding outpatient HD. Patient would need to transfer From [facility] to [facility] due to SNF placement. Submitting referral to [facility] for review. Navigator will continue to assist.    Suzen Satchel  Dialysis Navigator  [email or web address]

## 2024-08-27 NOTE — TOC Progression Note (Signed)
 Transition of Care [facility]) - Progression Note    Patient Details  Name: Deanna [name] MRN: [id number] Date of Birth: 1951-08-20  Transition of Care [facility]) CM/SW Contact  Daved JONETTA Hamilton, RN Phone Number: 08/27/2024, 4:17 PM  Clinical Narrative:     Pending AuthID: [id number]    Expected Discharge Plan: Skilled Nursing Facility Barriers to Discharge: Insurance Authorization               Expected Discharge Plan and Services   Discharge Planning Services: CM Consult Post Acute Care Choice: Skilled Nursing Facility Living arrangements for the past 2 months: Assisted Living Facility Expected Discharge Date: 08/18/24                                     Social Drivers of Health (SDOH) Interventions SDOH Screenings   Food Insecurity: Patient Declined (08/17/2024)  Housing: Unknown (08/17/2024)  Transportation Needs: Patient Declined (08/17/2024)  Utilities: Patient Declined (08/17/2024)  Depression (PHQ2-9): Low Risk  (06/12/2024)  Financial Resource Strain: Low Risk  (07/22/2024)   Received from [facility]  Social Connections: Patient Declined (08/17/2024)  Tobacco Use: Low Risk  (08/15/2024)    Readmission Risk Interventions    01/16/2022    3:45 PM 12/29/2021    2:33 PM  Readmission Risk Prevention Plan  Transportation Screening Complete Complete  PCP or Specialist Appt within 5-7 Days  Complete  Home Care Screening  Complete  Medication Review (RN CM)  Complete  Medication Review (RN Care Manager) Complete   PCP or Specialist appointment within 3-5 days of discharge Complete   HRI or Home Care Consult Complete   SW Recovery Care/Counseling Consult Complete   Palliative Care Screening Not Applicable   Skilled Nursing Facility Not Applicable

## 2024-08-27 NOTE — Progress Notes (Signed)
 [facility]  ROUNDING NOTE   Subjective:   Patient is known to our practice and receives outpatient dialysis treatments at [facility] on a MWF schedule, supervised by Dr. Marcelino.  Update: Patient seen and evaluated on dialysis. Verbal consent with son over the phone since patient cannot move right arm to sign. No issues with dialysis. Patient agitated with loss of function to right hand.  Update:   Patient seen and evaluated during dialysis   HEMODIALYSIS FLOWSHEET:  Blood Flow Rate (mL/min): 350 mL/min Arterial Pressure (mmHg): -203.02 mmHg Venous Pressure (mmHg): 194.94 mmHg TMP (mmHg): 10.7 mmHg Ultrafiltration Rate (mL/min): 431 mL/min Dialysate Flow Rate (mL/min): 300 ml/min Dialysis Fluid Bolus: Normal Saline Bolus Amount (mL): 150 mL (saline given and uf off for decreased bp. Patient remains asyptomatic)  Has complained of nausea    Objective:  Vital signs in last 24 hours:  Temp:  [97.4 F (36.3 C)-98.3 F (36.8 C)] 98.2 F (36.8 C) (12/03 0804) Pulse Rate:  [95-111] 110 (12/03 1030) Resp:  [17-29] 17 (12/03 1030) BP: (95-152)/(56-75) 97/66 (12/03 1030) SpO2:  [99 %-100 %] 100 % (12/03 1000) Weight:  [74.4 kg] 74.4 kg (12/03 0804)  Weight change:  Filed Weights   08/25/24 0806 08/25/24 1251 08/27/24 0804  Weight: 72.5 kg 71.4 kg 74.4 kg    Intake/Output: No intake/output data recorded.   Intake/Output this shift:  No intake/output data recorded.  Physical Exam: General: NAD  Head: Normocephalic  Lungs:  Clear, room air  Heart: Regular   Abdomen:  Soft  Extremities: No peripheral edema.   Neurologic: Alert, awake, conversant  Skin: dry  Access: Rt chest Permcath    Basic Metabolic Panel: Recent Labs  Lab 08/22/24 0800 08/25/24 0940 08/27/24 0837  NA 128* 130* 132*  K 4.5 4.9 4.3  CL 87* 90* 91*  CO2 23 24 23   GLUCOSE 79 92 76  BUN 38* 43* 32*  CREATININE 6.79* 6.84* 6.11*  CALCIUM  8.4* 8.8* 9.4  PHOS 5.7* 6.2* 5.7*     Liver Function Tests: Recent Labs  Lab 08/22/24 0800 08/25/24 0940 08/27/24 0837  ALBUMIN  4.0 4.1 4.0   No results for input(s): LIPASE, AMYLASE in the last 168 hours. No results for input(s): AMMONIA in the last 168 hours.  CBC: Recent Labs  Lab 08/22/24 0800 08/25/24 1115 08/27/24 0837  WBC 8.1 7.1 9.2  NEUTROABS 5.7 4.8 6.4  HGB 10.7* 11.3* 11.1*  HCT 32.8* 35.3* 35.6*  MCV 84.1 84.2 86.0  PLT 300 350 383    Cardiac Enzymes: No results for input(s): CKTOTAL, CKMB, CKMBINDEX, TROPONINI in the last 168 hours.  BNP: Invalid input(s): POCBNP  CBG: Recent Labs  Lab 08/20/24 1226 08/20/24 1616 08/21/24 0757 08/22/24 1233 08/22/24 1619  GLUCAP 112* 117* 88 83 103*    Microbiology: Results for orders placed or performed during the hospital encounter of 01/20/22  Blood culture (routine x 2)     Status: None   Collection Time: 01/20/22  7:22 PM   Specimen: BLOOD  Result Value Ref Range Status   Specimen Description BLOOD BLOOD RIGHT HAND  Final   Special Requests   Final    BOTTLES DRAWN AEROBIC AND ANAEROBIC Blood Culture results may not be optimal due to an inadequate volume of blood received in culture bottles   Culture   Final    NO GROWTH 5 DAYS Performed at [facility], [address] [place], [place] [address]    Report Status 01/25/2022 FINAL  Final  Blood culture (routine x 2)     Status: None   Collection Time: 01/20/22  7:22 PM   Specimen: BLOOD  Result Value Ref Range Status   Specimen Description BLOOD BLOOD LEFT HAND  Final   Special Requests   Final    BOTTLES DRAWN AEROBIC AND ANAEROBIC Blood Culture results may not be optimal due to an inadequate volume of blood received in culture bottles   Culture   Final    NO GROWTH 5 DAYS Performed at [facility], [address] [place], [place] [address]    Report Status 01/25/2022 FINAL  Final  Resp Panel by RT-PCR (Flu A&B, Covid) Nasopharyngeal Swab      Status: None   Collection Time: 01/20/22  7:28 PM   Specimen: Nasopharyngeal Swab; Nasopharyngeal(NP) swabs in vial transport medium  Result Value Ref Range Status   SARS Coronavirus 2 by RT PCR NEGATIVE NEGATIVE Final    Comment: (NOTE) SARS-CoV-2 target nucleic acids are NOT DETECTED.  The SARS-CoV-2 RNA is generally detectable in upper respiratory specimens during the acute phase of infection. The lowest concentration of SARS-CoV-2 viral copies this assay can detect is 138 copies/mL. A negative result does not preclude SARS-Cov-2 infection and should not be used as the sole basis for treatment or other patient management decisions. A negative result may occur with  improper specimen collection/handling, submission of specimen other than nasopharyngeal swab, presence of viral mutation(s) within the areas targeted by this assay, and inadequate number of viral copies(<138 copies/mL). A negative result must be combined with clinical observations, patient history, and epidemiological information. The expected result is Negative.  Fact Sheet for Patients:  bloggercourse.com  Fact Sheet for Healthcare Providers:  seriousbroker.it  This test is no t yet approved or cleared by the [place]  FDA and  has been authorized for detection and/or diagnosis of SARS-CoV-2 by FDA under an Emergency Use Authorization (EUA). This EUA will remain  in effect (meaning this test can be used) for the duration of the COVID-19 declaration under Section 564(b)(1) of the Act, 21 U.S.C.section 360bbb-3(b)(1), unless the authorization is terminated  or revoked sooner.       Influenza A by PCR NEGATIVE NEGATIVE Final   Influenza B by PCR NEGATIVE NEGATIVE Final    Comment: (NOTE) The Xpert Xpress SARS-CoV-2/FLU/RSV plus assay is intended as an aid in the diagnosis of influenza from Nasopharyngeal swab specimens and should not be used as a sole basis  for treatment. Nasal washings and aspirates are unacceptable for Xpert Xpress SARS-CoV-2/FLU/RSV testing.  Fact Sheet for Patients: bloggercourse.com  Fact Sheet for Healthcare Providers: seriousbroker.it  This test is not yet approved or cleared by the [place]  FDA and has been authorized for detection and/or diagnosis of SARS-CoV-2 by FDA under an Emergency Use Authorization (EUA). This EUA will remain in effect (meaning this test can be used) for the duration of the COVID-19 declaration under Section 564(b)(1) of the Act, 21 U.S.C. section 360bbb-3(b)(1), unless the authorization is terminated or revoked.  Performed at [facility], [address] [place], [place] [address]     Coagulation Studies: No results for input(s): LABPROT, INR in the last 72 hours.   Urinalysis: No results for input(s): COLORURINE, LABSPEC, PHURINE, GLUCOSEU, HGBUR, BILIRUBINUR, KETONESUR, PROTEINUR, UROBILINOGEN, NITRITE, LEUKOCYTESUR in the last 72 hours.  Invalid input(s): APPERANCEUR    Imaging: DG Chest 1 View Result Date: 08/26/2024 EXAM: 1 VIEW(S) XRAY OF THE CHEST 08/26/2024 03:38:00 PM COMPARISON: 08/14/2023  CLINICAL HISTORY: End stage chronic kidney disease (HCC) FINDINGS: LINES, TUBES AND DEVICES: Loop recorder device overlies left chest. Right central venous catheter with tip at superior cavoatrial junction. LUNGS AND PLEURA: Left lower lung linear opacities, likely atelectasis/scarring. No pleural effusion. No pneumothorax. HEART AND MEDIASTINUM: Atherosclerotic plaque. No acute abnormality of the cardiac and mediastinal silhouettes. BONES AND SOFT TISSUES: No acute osseous abnormality. IMPRESSION: 1. Left lower lung linear opacities, likely atelectasis/scarring. Electronically signed by: Norman Gatlin MD 08/26/2024 08:09 PM EST RP Workstation: [id number]      Medications:     aspirin  EC   81 mg Oral Daily   atorvastatin   40 mg Oral Daily   calcium  acetate  667 mg Oral TID WC   Chlorhexidine  Gluconate Cloth  6 each Topical Q0600   clopidogrel   75 mg Oral Daily   feeding supplement (NEPRO CARB STEADY)  237 mL Oral BID BM   heparin  injection (subcutaneous)  5,000 Units Subcutaneous Q8H   pantoprazole   40 mg Oral QHS   sertraline   100 mg Oral Daily   sodium chloride  flush  3 mL Intravenous Once   acetaminophen  **OR** acetaminophen  (TYLENOL ) oral liquid 160 mg/5 mL **OR** acetaminophen , ammonium lactate, diclofenac Sodium, ondansetron  (ZOFRAN ) IV, ondansetron , senna-docusate, traMADol  Assessment/ Plan:  [name] is a 73 y.o.  female with a past medical history of ESRD on dialysis, CVA, coronary artery disease, status post PCI, CHF with reduced ejection fraction, diabetes mellitus, and hypertension, Patient presents to the ED from her dialysis center with acute onset of slurred speech and facial droop there. She was also experiencing worsened weakness/numbness of the right upper extremity relative to her baseline. Code Stroke was called.    [facility]/MWF/Rt PC/74.0 kg   End-stage renal disease on hemodialysis.   Receiving dialysis today, UF 1.5L as tolerated. Next treatment scheduled for Friday. Patient accepted to rehab in [place], dialysis navigator seeking transfer at [facility].     Acute versus subacute ischemic CVA anterior left temporal lobe status post tenecteplase  administration.  History of recurrent CVA. Loop recorder placed on 08/08/23. Prescribed aspirin  and Plavix  outpatient. CT head-likely acute or subacute nonhemorrhagic infarct in the anterior left temporal lobe measuring up to moderate confluent subcortical white matter hypoattenuation stable.  MRI brain- Acute nonhemorrhagic infarct in the posterior left frontal lobe involving the primary motor cortex, with associated T2/FLAIR hyperintensity. Confluent periventricular and subcortical T2  hyperintensities bilaterally, increased from the prior study. Remote infarcts in the anterior right insular cortex, right frontal lobe operculum, and lateral left temporal lobe are new since the prior exam. Plavix  and aspirin  continued per neurology.    3. Anemia of chronic kidney disease Hemoglobin & Hematocrit     Component Value Date/Time   HGB 11.1 (L) 08/27/2024 0837   HCT 35.6 (L) 08/27/2024 9162    Patient receives Mircera at outpatient clinic.  Hgb has remained stable, no need for ESA.   4. Secondary Hyperparathyroidism: with outpatient labs: PTH 652, phosphorus 5.2, corr calcium  08/06/2024.  Patient prescribed calcitriol, Cinacalcet and calcium  acetate outpatient.  Update: Calcium  and phosphorus improving. Continue calcium  acetate with meals.   5.  Hypertension chronic kidney disease  Patient currently not on any antihypertensive medications. Blood pressure 97/66 during dialysis.        LOS: 12 Taunia Frasco 12/3/202511:23 AM

## 2024-08-27 NOTE — Progress Notes (Signed)
 PROGRESS NOTE  Deanna [name] FMW:[id number] DOB: Feb 28, 1951 DOA: 08/15/2024 PCP: Brutus Delon SAUNDERS, NP   LOS: 12 days   Brief narrative:  Deanna [name] is a 73 y.o. female with medical history significant for CVA, ESRD on HD, extensive PAD s/p angioplasty and stent, history of HFrEF with recovered EF, hypertension, hyperlipidemia, type 2 diabetes, CAD, DISH arthritis, C3 anterior osteophyte fracture, was brought into the hospital from outpatient dialysis center due to right facial droop and slurred speech.  She also had numbness and weakness in the right upper extremity.  Patient was seen by neurology and was given TNK and was initially admitted to the ICU.  At this time patient has been stable and awaiting for skilled nursing facility.    Assessment/Plan: Principal Problem:   Stroke (cerebrum) (HCC)    Acute left frontal lobe nonhemorrhagic stroke Patient underwent tenecteplase  on 08/15/2024 at 3:15 PM.  Followed by neurology during hospitalization follow-up brain scan showed acute nonhemorrhagic hemorrhagic infarct in the posterior left frontal lobe.  Continue aspirin , Plavix  and Lipitor.  At this time physical therapy has seen the patient and recommended skilled nursing facility on discharge.   ESRD on HD Nephrology followed the patient during hospitalization for hemodialysis needs.  Continue dialysis as per nephrology.   Type II DM Will continue to monitor.  Diet controlled.  Last hemoglobin A1c of 5.7.  Continue to monitor.    hyperlipidemia,  Continue Lipitor  hypertension,  Blood pressure seems to be stable at this time.  Not on antihypertensives.  Was on metoprolol  at home including midodrine .  Latest blood pressure at this time at 104/58.  PAD s/p stent, CAD,  Continue aspirin  and Lipitor  History of CVA. Continue aspirin  Lipitor   Class 1 obesity : Body mass index is 29.58 kg/m.SABRA  Patient will benefit from lifestyle modifications Patient.   DVT prophylaxis:  heparin  injection 5,000 Units Start: 08/20/24 1700 SCD's Start: 08/15/24 1522   Disposition: Skilled nursing facility as per PT evaluation.  Medically stable for disposition.  Status is: Inpatient Remains inpatient appropriate because: Awaiting for skilled nursing facility    Code Status:     Code Status: Full Code  Family Communication: None at bedside  Consultants: Nephrology Neurology  Procedures: Hemodialysis  Anti-infectives:  None  Anti-infectives (From admission, onward)    None        Subjective: Today, patient was seen and examined at bedside.  Seen during hemodialysis.  Patient states that he has a little bit of nausea but no vomiting.  Has had bowel movements.  Denies any shortness of breath dyspnea but complains of generalized weakness.  Objective: Vitals:   08/27/24 1134 08/27/24 1142  BP: (!) 80/57 (!) 104/58  Pulse: (!) 107 (!) 104  Resp: (!) 22 (!) 6  Temp:    SpO2:      Intake/Output Summary (Last 24 hours) at 08/27/2024 1228 Last data filed at 08/27/2024 1142 Gross per 24 hour  Intake --  Output 400 ml  Net -400 ml   Filed Weights   08/25/24 0806 08/25/24 1251 08/27/24 0804  Weight: 72.5 kg 71.4 kg 74.4 kg   Body mass index is 30.99 kg/m.   Physical Exam: GENERAL: Patient is alert awake and oriented. Not in obvious distress. HENT: No scleral pallor or icterus. Pupils equally reactive to light. Oral mucosa is moist NECK: is supple, no gross swelling noted. CHEST: Clear to auscultation. No crackles or wheezes.  Right-sided chest wall permacath in place. CVS: S1 and S2  heard, no murmur. Regular rate and rhythm.  ABDOMEN: Soft, non-tender, bowel sounds are present. EXTREMITIES: No edema. CNS: Cranial nerves are intact.  Right-sided hemiplegia. SKIN: warm and dry without rashes.  Data Review: I have personally reviewed the following laboratory data and studies,  CBC: Recent Labs  Lab 08/22/24 0800 08/25/24 1115 08/27/24 0837   WBC 8.1 7.1 9.2  NEUTROABS 5.7 4.8 6.4  HGB 10.7* 11.3* 11.1*  HCT 32.8* 35.3* 35.6*  MCV 84.1 84.2 86.0  PLT 300 350 383   Basic Metabolic Panel: Recent Labs  Lab 08/22/24 0800 08/25/24 0940 08/27/24 0837  NA 128* 130* 132*  K 4.5 4.9 4.3  CL 87* 90* 91*  CO2 23 24 23   GLUCOSE 79 92 76  BUN 38* 43* 32*  CREATININE 6.79* 6.84* 6.11*  CALCIUM  8.4* 8.8* 9.4  PHOS 5.7* 6.2* 5.7*   Liver Function Tests: Recent Labs  Lab 08/22/24 0800 08/25/24 0940 08/27/24 0837  ALBUMIN  4.0 4.1 4.0   No results for input(s): LIPASE, AMYLASE in the last 168 hours. No results for input(s): AMMONIA in the last 168 hours. Cardiac Enzymes: No results for input(s): CKTOTAL, CKMB, CKMBINDEX, TROPONINI in the last 168 hours. BNP (last 3 results) No results for input(s): BNP in the last 8760 hours.  ProBNP (last 3 results) No results for input(s): PROBNP in the last 8760 hours.  CBG: Recent Labs  Lab 08/20/24 1616 08/21/24 0757 08/22/24 1233 08/22/24 1619  GLUCAP 117* 88 83 103*   No results found for this or any previous visit (from the past 240 hours).   Studies: DG Chest 1 View Result Date: 08/26/2024 EXAM: 1 VIEW(S) XRAY OF THE CHEST 08/26/2024 03:38:00 PM COMPARISON: 08/14/2023 CLINICAL HISTORY: End stage chronic kidney disease (HCC) FINDINGS: LINES, TUBES AND DEVICES: Loop recorder device overlies left chest. Right central venous catheter with tip at superior cavoatrial junction. LUNGS AND PLEURA: Left lower lung linear opacities, likely atelectasis/scarring. No pleural effusion. No pneumothorax. HEART AND MEDIASTINUM: Atherosclerotic plaque. No acute abnormality of the cardiac and mediastinal silhouettes. BONES AND SOFT TISSUES: No acute osseous abnormality. IMPRESSION: 1. Left lower lung linear opacities, likely atelectasis/scarring. Electronically signed by: Norman Gatlin MD 08/26/2024 08:09 PM EST RP Workstation: [id number]      Vernal Alstrom, MD  [facility] 08/27/2024  If 7PM-7AM, please contact night-coverage

## 2024-08-27 NOTE — Progress Notes (Signed)
 Physical Therapy Treatment Patient Details Name: Deanna [name] MRN: [id number] DOB: 1951-01-15 Today's Date: 08/27/2024   History of Present Illness Deanna [name] is a 73 y.o. female with a past medical history of ESRD on dialysis, CAD, CVA, status post PCI, CHF with reduced ejection fraction, DM, HTN. Patient presents to the ED from her dialysis center with acute onset of slurred speech and facial droop there. She was also experiencing worsened weakness/numbness of the right upper extremity relative to her baseline. Found to have acute left frontal lobe nonhemorrhagic stroke: S/p tenecteplase  on 08/15/2024. Follow-up MRI brain showed acute nonhemorrhagic infarct in the posterior left frontal lobe.    PT Comments  Pt seen for PT tx with pt received in bed, requiring max education re: need to participate in therapy, importance of OOB mobility. Pt requires min assist for bed mobility but heavy reliance on hospital bed features. Pt is able to complete stand pivot bed>recliner on L with mod assist for stand pivot. Recommend ongoing PT services to progress mobility to increase independence with mobility & reduce fall risk.   If plan is discharge home, recommend the following: A lot of help with walking and/or transfers;A lot of help with bathing/dressing/bathroom;Assistance with cooking/housework;Assist for transportation;Help with stairs or ramp for entrance;Direct supervision/assist for medications management;Direct supervision/assist for financial management   Can travel by private vehicle     No  Equipment Recommendations   (defer to next venue)    Recommendations for Other Services       Precautions / Restrictions Precautions Precautions: Fall Recall of Precautions/Restrictions: Impaired Restrictions Weight Bearing Restrictions Per Provider Order: No     Mobility  Bed Mobility Overal bed mobility: Needs Assistance Bed Mobility: Supine to Sit     Supine to sit: Mod assist,  HOB elevated, Used rails (exit L side of bed)     General bed mobility comments: max assist to scoot to sitting EOB & scoot R hip to EOB    Transfers Overall transfer level: Needs assistance Equipment used: 1 person hand held assist Transfers: Bed to chair/wheelchair/BSC   Stand pivot transfers: Mod assist (bed>recliner on L, assistance to power up to stand & assistance to safely lower into chair)              Ambulation/Gait                   Stairs             Wheelchair Mobility     Tilt Bed    Modified Rankin (Stroke Patients Only)       Balance Overall balance assessment: Needs assistance Sitting-balance support: Feet supported Sitting balance-[name]: Fair                                      Hotel Manager: No apparent difficulties  Cognition Arousal: Alert Behavior During Therapy: Flat affect   PT - Cognitive impairments: Awareness, Problem solving, Safety/Judgement, Memory                         Following commands: Impaired Following commands impaired: Follows multi-step commands with increased time    Cueing Cueing Techniques: Verbal cues, Gestural cues, Tactile cues, Visual cues  Exercises      General Comments        Pertinent Vitals/Pain Pain Assessment Pain Assessment: Faces Faces Pain Scale: No  hurt    Home Living                          Prior Function            PT Goals (current goals can now be found in the care plan section) Acute Rehab PT Goals Patient Stated Goal: Pt wants to go home PT Goal Formulation: With patient Time For Goal Achievement: 09/07/24 Potential to Achieve Goals: Fair Progress towards PT goals: Progressing toward goals    Frequency    Min 2X/week      PT Plan      Co-evaluation              AM-PAC PT 6 Clicks Mobility   Outcome Measure  Help needed turning from your back to your side while in a  flat bed without using bedrails?: A Lot Help needed moving from lying on your back to sitting on the side of a flat bed without using bedrails?: Total Help needed moving to and from a bed to a chair (including a wheelchair)?: A Lot Help needed standing up from a chair using your arms (e.g., wheelchair or bedside chair)?: A Lot Help needed to walk in hospital room?: Total Help needed climbing 3-5 steps with a railing? : Total 6 Click Score: 9    End of Session   Activity Tolerance: Patient tolerated treatment well Patient left: in chair;with call bell/phone within reach;with chair alarm set Nurse Communication: Mobility status PT Visit Diagnosis: Other abnormalities of gait and mobility (R26.89);Muscle weakness (generalized) (M62.81);Unsteadiness on feet (R26.81);Other symptoms and signs involving the nervous system (R29.898);Hemiplegia and hemiparesis Hemiplegia - Right/Left: Right Hemiplegia - dominant/non-dominant: Dominant     Time: 8668-8657 PT Time Calculation (min) (ACUTE ONLY): 11 min  Charges:    $Therapeutic Activity: 8-22 mins PT General Charges $$ ACUTE PT VISIT: 1 Visit                     Richerd Pinal, PT, DPT 08/27/24, 2:32 PM   [name] 08/27/2024, 2:30 PM

## 2024-08-27 NOTE — TOC Progression Note (Signed)
 Transition of Care [facility]) - Progression Note    Patient Details  Name: Deanna [name] MRN: [id number] Date of Birth: [birth year]  Transition of Care [facility]) CM/SW Contact  Daved JONETTA Hamilton, RN Phone Number: 08/27/2024, 9:36 AM  Clinical Narrative:     Prior auth unable to be started until therapy is able to see patient and patient participates in session(s).   Spoke with patient 12/02 and discussed the importance of therapy session participation as part of her recovery and ability to go to [facility], patient verbalized understanding and agreement to participate in therapy sessions.   Patient at dialysis at the time of this documentation, PT/OT will attempt to see patient once she returns to room.   Expected Discharge Plan: Skilled Nursing Facility Barriers to Discharge: Insurance Authorization               Expected Discharge Plan and Services   Discharge Planning Services: CM Consult Post Acute Care Choice: Skilled Nursing Facility Living arrangements for the past 2 months: Assisted Living Facility Expected Discharge Date: 08/18/24                                     Social Drivers of Health (SDOH) Interventions SDOH Screenings   Food Insecurity: Patient Declined (08/17/2024)  Housing: Unknown (08/17/2024)  Transportation Needs: Patient Declined (08/17/2024)  Utilities: Patient Declined (08/17/2024)  Depression (PHQ2-9): Low Risk  (06/12/2024)  Financial Resource Strain: Low Risk  (07/22/2024)   Received from [facility]  Social Connections: Patient Declined (08/17/2024)  Tobacco Use: Low Risk  (08/15/2024)    Readmission Risk Interventions    01/16/2022    3:45 PM 12/29/2021    2:33 PM  Readmission Risk Prevention Plan  Transportation Screening Complete Complete  PCP or Specialist Appt within 5-7 Days  Complete  Home Care Screening  Complete  Medication Review (RN CM)  Complete  Medication Review (RN Care Manager) Complete   PCP or  Specialist appointment within 3-5 days of discharge Complete   HRI or Home Care Consult Complete   SW Recovery Care/Counseling Consult Complete   Palliative Care Screening Not Applicable   Skilled Nursing Facility Not Applicable

## 2024-08-27 NOTE — Progress Notes (Signed)
   08/27/24 1142  Vitals  BP (!) 104/58  MAP (mmHg) 73  BP Location Left Arm  BP Method Automatic  Patient Position (if appropriate) Lying  Pulse Rate (!) 104  Pulse Rate Source Monitor  ECG Heart Rate (!) 104  Resp (!) 6  During Treatment Monitoring  Blood Flow Rate (mL/min) 0 mL/min  Arterial Pressure (mmHg) -27.67 mmHg  Venous Pressure (mmHg) 30.7 mmHg  TMP (mmHg) 3.03 mmHg  Ultrafiltration Rate (mL/min) 0 mL/min  Dialysate Flow Rate (mL/min) 300 ml/min  Dialysate Potassium Concentration 3  Dialysate Calcium  Concentration 2.5  Duration of HD Treatment -hour(s) 3.24 hour(s)  Cumulative Fluid Removed (mL) per Treatment  359  HD Safety Checks Performed Yes  Intra-Hemodialysis Comments Tx completed  Dialysis Fluid Bolus Normal Saline  Post Treatment  Dialyzer Clearance Clear  Hemodialysis Intake (mL) 0 mL  Liters Processed 68  Fluid Removed (mL) 400 mL  Tolerated HD Treatment  (Tx uneventful. Vs are stable upon discharge. Blood pressure decreased during HD. Saline administered and goal decreased. Blood pressure now at baseline)  Fistula / Graft Right Forearm Arteriovenous vein graft  Placement Date/Time: 05/17/23 (c) 1237   Orientation: Right  Access Location: Forearm  Access Type: Arteriovenous vein graft  Site Condition No complications  Fistula / Graft Assessment Absent;Present;Thrill  Drainage Description None

## 2024-08-28 DIAGNOSIS — I639 Cerebral infarction, unspecified: Secondary | ICD-10-CM | POA: Diagnosis not present

## 2024-08-28 LAB — HEPATITIS B CORE ANTIBODY, TOTAL: HEP B CORE AB: POSITIVE — AB

## 2024-08-28 MED ORDER — MIDODRINE HCL 5 MG PO TABS
5.0000 mg | ORAL_TABLET | ORAL | Status: DC
  Administered 2024-08-29 – 2024-09-05 (×4): 5 mg via ORAL
  Filled 2024-08-28 (×4): qty 1

## 2024-08-28 NOTE — Plan of Care (Addendum)
 Pt BP has been running low. Dr notified.  Problem: Education: Goal: Knowledge of disease or condition will improve Outcome: Not Progressing Goal: Knowledge of secondary prevention will improve (MUST DOCUMENT ALL) Outcome: Not Progressing Goal: Knowledge of patient specific risk factors will improve (DELETE if not current risk factor) Outcome: Not Progressing   Problem: Ischemic Stroke/TIA Tissue Perfusion: Goal: Complications of ischemic stroke/TIA will be minimized Outcome: Not Progressing   Problem: Coping: Goal: Will verbalize positive feelings about self Outcome: Not Progressing Goal: Will identify appropriate support needs Outcome: Not Progressing   Problem: Health Behavior/Discharge Planning: Goal: Ability to manage health-related needs will improve Outcome: Not Progressing Goal: Goals will be collaboratively established with patient/family Outcome: Not Progressing   Problem: Self-Care: Goal: Ability to participate in self-care as condition permits will improve Outcome: Not Progressing Goal: Verbalization of feelings and concerns over difficulty with self-care will improve Outcome: Not Progressing Goal: Ability to communicate needs accurately will improve Outcome: Not Progressing   Problem: Nutrition: Goal: Risk of aspiration will decrease Outcome: Not Progressing Goal: Dietary intake will improve Outcome: Not Progressing   Problem: Education: Goal: Knowledge of General Education information will improve Description: Including pain rating scale, medication(s)/side effects and non-pharmacologic comfort measures Outcome: Not Progressing   Problem: Health Behavior/Discharge Planning: Goal: Ability to manage health-related needs will improve Outcome: Not Progressing   Problem: Clinical Measurements: Goal: Ability to maintain clinical measurements within normal limits will improve Outcome: Not Progressing Goal: Will remain free from infection Outcome: Not  Progressing Goal: Diagnostic test results will improve Outcome: Not Progressing Goal: Respiratory complications will improve Outcome: Not Progressing Goal: Cardiovascular complication will be avoided Outcome: Not Progressing   Problem: Activity: Goal: Risk for activity intolerance will decrease Outcome: Not Progressing   Problem: Nutrition: Goal: Adequate nutrition will be maintained Outcome: Not Progressing   Problem: Coping: Goal: Level of anxiety will decrease Outcome: Not Progressing   Problem: Elimination: Goal: Will not experience complications related to bowel motility Outcome: Not Progressing Goal: Will not experience complications related to urinary retention Outcome: Not Progressing   Problem: Pain Managment: Goal: General experience of comfort will improve and/or be controlled Outcome: Not Progressing   Problem: Safety: Goal: Ability to remain free from injury will improve Outcome: Not Progressing   Problem: Skin Integrity: Goal: Risk for impaired skin integrity will decrease Outcome: Not Progressing   Problem: Education: Goal: Ability to describe self-care measures that may prevent or decrease complications (Diabetes Survival Skills Education) will improve Outcome: Not Progressing Goal: Individualized Educational Video(s) Outcome: Not Progressing   Problem: Coping: Goal: Ability to adjust to condition or change in health will improve Outcome: Not Progressing   Problem: Fluid Volume: Goal: Ability to maintain a balanced intake and output will improve Outcome: Not Progressing   Problem: Health Behavior/Discharge Planning: Goal: Ability to identify and utilize available resources and services will improve Outcome: Not Progressing Goal: Ability to manage health-related needs will improve Outcome: Not Progressing   Problem: Metabolic: Goal: Ability to maintain appropriate glucose levels will improve Outcome: Not Progressing   Problem:  Nutritional: Goal: Maintenance of adequate nutrition will improve Outcome: Not Progressing Goal: Progress toward achieving an optimal weight will improve Outcome: Not Progressing   Problem: Skin Integrity: Goal: Risk for impaired skin integrity will decrease Outcome: Not Progressing   Problem: Tissue Perfusion: Goal: Adequacy of tissue perfusion will improve Outcome: Not Progressing

## 2024-08-28 NOTE — Progress Notes (Addendum)
 PROGRESS NOTE  Deanna [name] FMW:[id number] DOB: 08-02-1951 DOA: 08/15/2024 PCP: Brutus Delon SAUNDERS, NP   LOS: 13 days   Brief narrative:  Deanna [name] is a 73 y.o. female with medical history significant for CVA, ESRD on HD, extensive PAD s/p angioplasty and stent, history of HFrEF with recovered EF, hypertension, hyperlipidemia, type 2 diabetes, CAD, DISH arthritis, C3 anterior osteophyte fracture, was brought into the hospital from outpatient dialysis center due to right facial droop and slurred speech.  She also had numbness and weakness in the right upper extremity.  Patient was seen by neurology and was given TNK and was initially admitted to the ICU.  At this time, patient has been stable and awaiting for skilled nursing facility.    Assessment/Plan: Principal Problem:   Stroke (cerebrum) (HCC)    Acute left frontal lobe nonhemorrhagic stroke Patient underwent tenecteplase  on 08/15/2024 at 3:15 PM.  Followed by neurology during hospitalization follow-up brain scan showed acute nonhemorrhagic hemorrhagic infarct in the posterior left frontal lobe.  Continue aspirin , Plavix  and Lipitor.  At this time physical therapy has seen the patient and recommended skilled nursing facility on discharge.   ESRD on HD Nephrology followed the patient during hospitalization for hemodialysis needs.  Continue dialysis as per nephrology.   Type II DM Will continue to monitor.  Diet controlled.  Last hemoglobin A1c of 5.7.  Continue to monitor.    hyperlipidemia,  Continue Lipitor  hypertension,  .  Not on antihypertensives.  Was on metoprolol  at home including midodrine .  Latest blood pressure at 83/59.  Might need midodrine  if continues to be low.  PAD s/p stent, CAD,  Continue aspirin  and Lipitor  History of CVA. Continue aspirin  Lipitor   Class 1 obesity : Body mass index is 29.58 kg/m.SABRA  Patient will benefit from lifestyle modifications Patient.   DVT prophylaxis: heparin   injection 5,000 Units Start: 08/20/24 1700 SCD's Start: 08/15/24 1522   Disposition: Skilled nursing facility as per PT evaluation.  Medically stable for disposition.  Status is: Inpatient Remains inpatient appropriate because: Awaiting for skilled nursing facility    Code Status:     Code Status: Full Code  Family Communication: None at bedside.  Spoke with the patient's son on the phone and updated him about the clinical condition of the patient.  Consultants: Nephrology Neurology  Procedures: Hemodialysis  Anti-infectives:  None  Anti-infectives (From admission, onward)    None       Subjective: Today, patient was seen and examined at bedside.  Patient states that she feels okay.  Denies interval complaints denies any shortness of breath chest pain palpitation fever or chills.    Objective: Vitals:   08/28/24 0443 08/28/24 0802  BP: 116/60 (!) 83/59  Pulse: 96 99  Resp: 16 17  Temp: 97.9 F (36.6 C) 97.9 F (36.6 C)  SpO2: 100% 100%    Intake/Output Summary (Last 24 hours) at 08/28/2024 1015 Last data filed at 08/27/2024 1700 Gross per 24 hour  Intake 0 ml  Output 400 ml  Net -400 ml   Filed Weights   08/25/24 0806 08/25/24 1251 08/27/24 0804  Weight: 72.5 kg 71.4 kg 74.4 kg   Body mass index is 30.99 kg/m.   Physical Exam: GENERAL: Patient is alert awake and oriented. Not in obvious distress. HENT: No scleral pallor or icterus. Pupils equally reactive to light. Oral mucosa is moist NECK: is supple, no gross swelling noted. CHEST: Diminished breath sounds bilaterally.  Right-sided chest wall permacath in place.  CVS: S1 and S2 heard, no murmur. Regular rate and rhythm.  ABDOMEN: Soft, non-tender, bowel sounds are present. EXTREMITIES: No edema. CNS: Cranial nerves are intact.  Right-sided hemiplegia. SKIN: warm and dry without rashes.  Data Review: I have personally reviewed the following laboratory data and studies,  CBC: Recent Labs  Lab  08/28/2024 0800 08/25/24 1115 08/27/24 0837  WBC 8.1 7.1 9.2  NEUTROABS 5.7 4.8 6.4  HGB 10.7* 11.3* 11.1*  HCT 32.8* 35.3* 35.6*  MCV 84.1 84.2 86.0  PLT 300 350 383   Basic Metabolic Panel: Recent Labs  Lab 2024/08/28 0800 08/25/24 0940 08/27/24 0837  NA 128* 130* 132*  K 4.5 4.9 4.3  CL 87* 90* 91*  CO2 23 24 23   GLUCOSE 79 92 76  BUN 38* 43* 32*  CREATININE 6.79* 6.84* 6.11*  CALCIUM  8.4* 8.8* 9.4  PHOS 5.7* 6.2* 5.7*   Liver Function Tests: Recent Labs  Lab 08/28/24 0800 08/25/24 0940 08/27/24 0837  ALBUMIN  4.0 4.1 4.0   No results for input(s): LIPASE, AMYLASE in the last 168 hours. No results for input(s): AMMONIA in the last 168 hours. Cardiac Enzymes: No results for input(s): CKTOTAL, CKMB, CKMBINDEX, TROPONINI in the last 168 hours. BNP (last 3 results) No results for input(s): BNP in the last 8760 hours.  ProBNP (last 3 results) No results for input(s): PROBNP in the last 8760 hours.  CBG: Recent Labs  Lab 08/28/2024 1233 2024/08/28 1619  GLUCAP 83 103*   No results found for this or any previous visit (from the past 240 hours).   Studies: DG Chest 1 View Result Date: 08/26/2024 EXAM: 1 VIEW(S) XRAY OF THE CHEST 08/26/2024 03:38:00 PM COMPARISON: 08/14/2023 CLINICAL HISTORY: End stage chronic kidney disease (HCC) FINDINGS: LINES, TUBES AND DEVICES: Loop recorder device overlies left chest. Right central venous catheter with tip at superior cavoatrial junction. LUNGS AND PLEURA: Left lower lung linear opacities, likely atelectasis/scarring. No pleural effusion. No pneumothorax. HEART AND MEDIASTINUM: Atherosclerotic plaque. No acute abnormality of the cardiac and mediastinal silhouettes. BONES AND SOFT TISSUES: No acute osseous abnormality. IMPRESSION: 1. Left lower lung linear opacities, likely atelectasis/scarring. Electronically signed by: Norman Gatlin MD 08/26/2024 08:09 PM EST RP Workstation: [id number]      Vernal Alstrom,  MD  [facility] 08/28/2024  If 7PM-7AM, please contact night-coverage

## 2024-08-28 NOTE — Progress Notes (Signed)
 Physical Therapy Treatment Patient Details Name: Deanna [name] MRN: [id number] DOB: January 06, 1951 Today's Date: 08/28/2024   History of Present Illness Deanna [name] is a 73 y.o. female with a past medical history of ESRD on dialysis, CAD, CVA, status post PCI, CHF with reduced ejection fraction, DM, HTN. Patient presents to the ED from her dialysis center with acute onset of slurred speech and facial droop there. She was also experiencing worsened weakness/numbness of the right upper extremity relative to her baseline. Found to have acute left frontal lobe nonhemorrhagic stroke: S/p tenecteplase  on 08/15/2024. Follow-up MRI brain showed acute nonhemorrhagic infarct in the posterior left frontal lobe.    PT Comments  Pt was long sitting in bed upon arrival. She is alert but needs encouragement to perform OOB and PT session. Eventually agreeable. Extensive assistance to exit bed. Sat at EOB for several minutes prior to attempting STS to RW with author holding RUE. Pt unable. Stood without use of RW from elevated height with author supporting BUEs and blocking knees. Stood pivot to recliner from elevated bed height with max assist + max vcs. Encouraged RN staff to have +2 assist and/or use sara stedy if attempting to return pt to bed. DC recs remain appropriate. Acute PT will continue to follow per current POC.   If plan is discharge home, recommend the following: A lot of help with walking and/or transfers;A lot of help with bathing/dressing/bathroom;Assistance with cooking/housework;Assist for transportation;Help with stairs or ramp for entrance;Direct supervision/assist for medications management;Direct supervision/assist for financial management     Equipment Recommendations  Other (comment) (Defer to next leve of care)       Precautions / Restrictions Precautions Precautions: Fall Recall of Precautions/Restrictions: Impaired Restrictions Weight Bearing Restrictions Per Provider Order:  No     Mobility  Bed Mobility Overal bed mobility: Needs Assistance Bed Mobility: Supine to Sit, Sit to Supine  Supine to sit: Mod assist, HOB elevated, Used rails   Transfers Overall transfer level: Needs assistance Equipment used: 1 person hand held assist, Rolling walker (2 wheels) Transfers: Sit to/from Stand Sit to Stand: Mod assist, Max assist, From elevated surface Stand pivot transfers: Mod assist, Max assist, From elevated surface     Ambulation/Gait    General Gait Details: unable    Balance Overall balance assessment: Needs assistance Sitting-balance support: Feet supported Sitting balance-[name]: Fair     Standing balance support: During functional activity, Reliant on assistive device for balance Standing balance-[name]: Poor     Communication Communication Communication: No apparent difficulties  Cognition Arousal: Alert Behavior During Therapy: Flat affect   PT - Cognitive impairments: Awareness, Problem solving, Safety/Judgement, Memory    PT - Cognition Comments: pt not interested in session but agreeable after max encouragement Following commands: Intact Following commands impaired: Follows one step commands with increased time    Cueing Cueing Techniques: Verbal cues, Tactile cues         Pertinent Vitals/Pain Pain Assessment Pain Assessment: 0-10 Pain Score: 6  Pain Location: L hip, both knees Pain Descriptors / Indicators: Grimacing, Guarding, Discomfort Pain Intervention(s): Limited activity within patient's tolerance, Monitored during session, Premedicated before session, Repositioned, Heat applied (heating pack)     PT Goals (current goals can now be found in the care plan section) Acute Rehab PT Goals Patient Stated Goal: willing to go to rehab prior to returning to ALF Progress towards PT goals: Progressing toward goals    Frequency    Min 2X/week  Co-evaluation     PT goals addressed during session:  Mobility/safety with mobility;Balance;Proper use of DME;Strengthening/ROM        AM-PAC PT 6 Clicks Mobility   Outcome Measure  Help needed turning from your back to your side while in a flat bed without using bedrails?: A Lot Help needed moving from lying on your back to sitting on the side of a flat bed without using bedrails?: A Lot Help needed moving to and from a bed to a chair (including a wheelchair)?: A Lot Help needed standing up from a chair using your arms (e.g., wheelchair or bedside chair)?: A Lot Help needed to walk in hospital room?: Total Help needed climbing 3-5 steps with a railing? : Total 6 Click Score: 10    End of Session   Activity Tolerance: Patient tolerated treatment well Patient left: in chair;with call bell/phone within reach;with chair alarm set Nurse Communication: Mobility status PT Visit Diagnosis: Other abnormalities of gait and mobility (R26.89);Muscle weakness (generalized) (M62.81);Unsteadiness on feet (R26.81);Other symptoms and signs involving the nervous system (R29.898);Hemiplegia and hemiparesis Hemiplegia - Right/Left: Right Hemiplegia - dominant/non-dominant: Dominant     Time: 8893-8873 PT Time Calculation (min) (ACUTE ONLY): 20 min  Charges:    $Therapeutic Activity: 8-22 mins PT General Charges $$ ACUTE PT VISIT: 1 Visit                     Rankin Essex PTA 08/28/24, 1:26 PM

## 2024-08-28 NOTE — Plan of Care (Signed)

## 2024-08-28 NOTE — Progress Notes (Signed)
 OT Cancellation Note  Patient Details Name: Deanna [name] MRN: [id number] DOB: 06/24/1951   Cancelled Treatment:    Reason Eval/Treat Not Completed: Patient declined, no reason specified;Other (comment) (patient reports pain, reports she has already been up today. OT educated and attempted to coax patient to participate, she continued to refuse. RN notified.)  [name] Clause 08/28/2024, 2:24 PM

## 2024-08-28 NOTE — Progress Notes (Signed)
 [facility]  ROUNDING NOTE   Subjective:   Patient is known to our practice and receives outpatient dialysis treatments at [facility] on a MWF schedule, supervised by Dr. Marcelino.  Update: Patient seen and evaluated on dialysis. Verbal consent with son over the phone since patient cannot move right arm to sign. No issues with dialysis. Patient agitated with loss of function to right hand.  Update:   Patient sitting up in bed Alert Continues to have back pain   Objective:  Vital signs in last 24 hours:  Temp:  [97.7 F (36.5 C)-98.2 F (36.8 C)] 97.9 F (36.6 C) (12/04 0802) Pulse Rate:  [96-99] 99 (12/04 0802) Resp:  [16-18] 17 (12/04 0802) BP: (83-116)/(50-60) 83/59 (12/04 0802) SpO2:  [100 %] 100 % (12/04 0802)  Weight change:  Filed Weights   08/25/24 0806 08/25/24 1251 08/27/24 0804  Weight: 72.5 kg 71.4 kg 74.4 kg    Intake/Output: I/O last 3 completed shifts: In: 0  Out: 400 [Other:400]   Intake/Output this shift:  No intake/output data recorded.  Physical Exam: General: NAD  Head: Normocephalic  Lungs:  Clear, room air  Heart: Regular   Abdomen:  Soft  Extremities: No peripheral edema.   Neurologic: Alert, awake, conversant  Skin: dry  Access: Rt chest Permcath    Basic Metabolic Panel: Recent Labs  Lab 08/22/24 0800 08/25/24 0940 08/27/24 0837  NA 128* 130* 132*  K 4.5 4.9 4.3  CL 87* 90* 91*  CO2 23 24 23   GLUCOSE 79 92 76  BUN 38* 43* 32*  CREATININE 6.79* 6.84* 6.11*  CALCIUM  8.4* 8.8* 9.4  PHOS 5.7* 6.2* 5.7*    Liver Function Tests: Recent Labs  Lab 08/22/24 0800 08/25/24 0940 08/27/24 0837  ALBUMIN  4.0 4.1 4.0   No results for input(s): LIPASE, AMYLASE in the last 168 hours. No results for input(s): AMMONIA in the last 168 hours.  CBC: Recent Labs  Lab 08/22/24 0800 08/25/24 1115 08/27/24 0837  WBC 8.1 7.1 9.2  NEUTROABS 5.7 4.8 6.4  HGB 10.7* 11.3* 11.1*  HCT 32.8* 35.3* 35.6*  MCV 84.1  84.2 86.0  PLT 300 350 383    Cardiac Enzymes: No results for input(s): CKTOTAL, CKMB, CKMBINDEX, TROPONINI in the last 168 hours.  BNP: Invalid input(s): POCBNP  CBG: Recent Labs  Lab 08/22/24 1233 08/22/24 1619  GLUCAP 83 103*    Microbiology: Results for orders placed or performed during the hospital encounter of 01/20/22  Blood culture (routine x 2)     Status: None   Collection Time: 01/20/22  7:22 PM   Specimen: BLOOD  Result Value Ref Range Status   Specimen Description BLOOD BLOOD RIGHT HAND  Final   Special Requests   Final    BOTTLES DRAWN AEROBIC AND ANAEROBIC Blood Culture results may not be optimal due to an inadequate volume of blood received in culture bottles   Culture   Final    NO GROWTH 5 DAYS Performed at [facility], [address] [place], [place] [address]    Report Status 01/25/2022 FINAL  Final  Blood culture (routine x 2)     Status: None   Collection Time: 01/20/22  7:22 PM   Specimen: BLOOD  Result Value Ref Range Status   Specimen Description BLOOD BLOOD LEFT HAND  Final   Special Requests   Final    BOTTLES DRAWN AEROBIC AND ANAEROBIC Blood Culture results may not be optimal due to an inadequate volume of  blood received in culture bottles   Culture   Final    NO GROWTH 5 DAYS Performed at [facility], [address] [place], [place] [address]    Report Status 01/25/2022 FINAL  Final  Resp Panel by RT-PCR (Flu A&B, Covid) Nasopharyngeal Swab     Status: None   Collection Time: 01/20/22  7:28 PM   Specimen: Nasopharyngeal Swab; Nasopharyngeal(NP) swabs in vial transport medium  Result Value Ref Range Status   SARS Coronavirus 2 by RT PCR NEGATIVE NEGATIVE Final    Comment: (NOTE) SARS-CoV-2 target nucleic acids are NOT DETECTED.  The SARS-CoV-2 RNA is generally detectable in upper respiratory specimens during the acute phase of infection. The lowest concentration of SARS-CoV-2 viral copies this assay  can detect is 138 copies/mL. A negative result does not preclude SARS-Cov-2 infection and should not be used as the sole basis for treatment or other patient management decisions. A negative result may occur with  improper specimen collection/handling, submission of specimen other than nasopharyngeal swab, presence of viral mutation(s) within the areas targeted by this assay, and inadequate number of viral copies(<138 copies/mL). A negative result must be combined with clinical observations, patient history, and epidemiological information. The expected result is Negative.  Fact Sheet for Patients:  bloggercourse.com  Fact Sheet for Healthcare Providers:  seriousbroker.it  This test is no t yet approved or cleared by the [place]  FDA and  has been authorized for detection and/or diagnosis of SARS-CoV-2 by FDA under an Emergency Use Authorization (EUA). This EUA will remain  in effect (meaning this test can be used) for the duration of the COVID-19 declaration under Section 564(b)(1) of the Act, 21 U.S.C.section 360bbb-3(b)(1), unless the authorization is terminated  or revoked sooner.       Influenza A by PCR NEGATIVE NEGATIVE Final   Influenza B by PCR NEGATIVE NEGATIVE Final    Comment: (NOTE) The Xpert Xpress SARS-CoV-2/FLU/RSV plus assay is intended as an aid in the diagnosis of influenza from Nasopharyngeal swab specimens and should not be used as a sole basis for treatment. Nasal washings and aspirates are unacceptable for Xpert Xpress SARS-CoV-2/FLU/RSV testing.  Fact Sheet for Patients: bloggercourse.com  Fact Sheet for Healthcare Providers: seriousbroker.it  This test is not yet approved or cleared by the [place]  FDA and has been authorized for detection and/or diagnosis of SARS-CoV-2 by FDA under an Emergency Use Authorization (EUA). This EUA will  remain in effect (meaning this test can be used) for the duration of the COVID-19 declaration under Section 564(b)(1) of the Act, 21 U.S.C. section 360bbb-3(b)(1), unless the authorization is terminated or revoked.  Performed at [facility], [address] [place], [place] [address]     Coagulation Studies: No results for input(s): LABPROT, INR in the last 72 hours.   Urinalysis: No results for input(s): COLORURINE, LABSPEC, PHURINE, GLUCOSEU, HGBUR, BILIRUBINUR, KETONESUR, PROTEINUR, UROBILINOGEN, NITRITE, LEUKOCYTESUR in the last 72 hours.  Invalid input(s): APPERANCEUR    Imaging: DG Chest 1 View Result Date: 08/26/2024 EXAM: 1 VIEW(S) XRAY OF THE CHEST 08/26/2024 03:38:00 PM COMPARISON: 08/14/2023 CLINICAL HISTORY: End stage chronic kidney disease (HCC) FINDINGS: LINES, TUBES AND DEVICES: Loop recorder device overlies left chest. Right central venous catheter with tip at superior cavoatrial junction. LUNGS AND PLEURA: Left lower lung linear opacities, likely atelectasis/scarring. No pleural effusion. No pneumothorax. HEART AND MEDIASTINUM: Atherosclerotic plaque. No acute abnormality of the cardiac and mediastinal silhouettes. BONES AND SOFT TISSUES: No acute osseous abnormality. IMPRESSION: 1. Left  lower lung linear opacities, likely atelectasis/scarring. Electronically signed by: Norman Gatlin MD 08/26/2024 08:09 PM EST RP Workstation: [id number]      Medications:     aspirin  EC  81 mg Oral Daily   atorvastatin   40 mg Oral Daily   calcium  acetate  667 mg Oral TID WC   Chlorhexidine  Gluconate Cloth  6 each Topical Q0600   clopidogrel   75 mg Oral Daily   feeding supplement (NEPRO CARB STEADY)  237 mL Oral BID BM   heparin  injection (subcutaneous)  5,000 Units Subcutaneous Q8H   pantoprazole   40 mg Oral QHS   sertraline   100 mg Oral Daily   sodium chloride  flush  3 mL Intravenous Once   acetaminophen  **OR** acetaminophen  (TYLENOL )  oral liquid 160 mg/5 mL **OR** acetaminophen , ammonium lactate, diclofenac Sodium, ondansetron  (ZOFRAN ) IV, ondansetron , senna-docusate, traMADol  Assessment/ Plan:  [name] is a 73 y.o.  female with a past medical history of ESRD on dialysis, CVA, coronary artery disease, status post PCI, CHF with reduced ejection fraction, diabetes mellitus, and hypertension, Patient presents to the ED from her dialysis center with acute onset of slurred speech and facial droop there. She was also experiencing worsened weakness/numbness of the right upper extremity relative to her baseline. Code Stroke was called.    [facility]/MWF/Rt PC/74.0 kg   End-stage renal disease on hemodialysis.   Dialysis received yesterday with UF . Next treatment scheduled for Friday. Patient accepted to rehab in [place], dialysis navigator awaiting acceptance at [facility].     Acute versus subacute ischemic CVA anterior left temporal lobe status post tenecteplase  administration.  History of recurrent CVA. Loop recorder placed on 08/08/23. Prescribed aspirin  and Plavix  outpatient. CT head-likely acute or subacute nonhemorrhagic infarct in the anterior left temporal lobe measuring up to moderate confluent subcortical white matter hypoattenuation stable.  MRI brain- Acute nonhemorrhagic infarct in the posterior left frontal lobe involving the primary motor cortex, with associated T2/FLAIR hyperintensity. Confluent periventricular and subcortical T2 hyperintensities bilaterally, increased from the prior study. Remote infarcts in the anterior right insular cortex, right frontal lobe operculum, and lateral left temporal lobe are new since the prior exam. Plavix  and aspirin  continued per neurology.    3. Anemia of chronic kidney disease Hemoglobin & Hematocrit     Component Value Date/Time   HGB 11.1 (L) 08/27/2024 0837   HCT 35.6 (L) 08/27/2024 9162    Patient receives Mircera at outpatient clinic.  Will  continue to monitor Hgb   4. Secondary Hyperparathyroidism: with outpatient labs: PTH 652, phosphorus 5.2, corr calcium  08/06/2024.  Patient prescribed calcitriol, Cinacalcet and calcium  acetate outpatient.  Update: Will continue to monitor calcium  and phos during this admission. Continue calcium  acetate with meals.   5.  Hypertension chronic kidney disease  Patient currently not on any antihypertensive medications. Blood pressure soft today, patient mentating appropriately        LOS: 13 Tywaun Hiltner 12/4/20251:20 PM

## 2024-08-29 DIAGNOSIS — I639 Cerebral infarction, unspecified: Secondary | ICD-10-CM | POA: Diagnosis not present

## 2024-08-29 LAB — RENAL FUNCTION PANEL
Albumin: 4.2 g/dL (ref 3.5–5.0)
Anion gap: 16 — ABNORMAL HIGH (ref 5–15)
BUN: 41 mg/dL — ABNORMAL HIGH (ref 8–23)
CO2: 26 mmol/L (ref 22–32)
Calcium: 9.7 mg/dL (ref 8.9–10.3)
Chloride: 88 mmol/L — ABNORMAL LOW (ref 98–111)
Creatinine, Ser: 5.87 mg/dL — ABNORMAL HIGH (ref 0.44–1.00)
GFR, Estimated: 7 mL/min — ABNORMAL LOW (ref >60)
Glucose, Bld: 106 mg/dL — ABNORMAL HIGH (ref 70–99)
Phosphorus: 4.6 mg/dL (ref 2.5–4.6)
Potassium: 4.2 mmol/L (ref 3.5–5.1)
Sodium: 129 mmol/L — ABNORMAL LOW (ref 135–145)

## 2024-08-29 LAB — CBC
HCT: 34.3 % — ABNORMAL LOW (ref 36.0–46.0)
Hemoglobin: 11.3 g/dL — ABNORMAL LOW (ref 12.0–15.0)
MCH: 27.4 pg (ref 26.0–34.0)
MCHC: 32.9 g/dL (ref 30.0–36.0)
MCV: 83.3 fL (ref 80.0–100.0)
Platelets: 366 K/uL (ref 150–400)
RBC: 4.12 MIL/uL (ref 3.87–5.11)
RDW: 16.4 % — ABNORMAL HIGH (ref 11.5–15.5)
WBC: 10.8 K/uL — ABNORMAL HIGH (ref 4.0–10.5)
nRBC: 0 % (ref 0.0–0.2)

## 2024-08-29 MED ORDER — HEPARIN SODIUM (PORCINE) 1000 UNIT/ML IJ SOLN
INTRAMUSCULAR | Status: AC
  Filled 2024-08-29: qty 4

## 2024-08-29 MED ORDER — ALBUMIN HUMAN 25 % IV SOLN
25.0000 g | Freq: Once | INTRAVENOUS | Status: AC
  Administered 2024-08-29: 25 g via INTRAVENOUS

## 2024-08-29 MED ORDER — ALBUMIN HUMAN 25 % IV SOLN
INTRAVENOUS | Status: AC
  Filled 2024-08-29: qty 100

## 2024-08-29 NOTE — Progress Notes (Signed)
 Hemodialysis Note:  Received patient in bed to unit. Alert and oriented. Informed consent singed and in chart.  Treatment initiated: 0900 Treatment completed: 1238  Access used: Right Subclavian catheter Access issues: None  Unable to pull fluids due to hypotension. 200 ml saline bolus given during treatment for BP support. Transported back to room, alert without acute distress. Report given to patient's RN.  Total UF removed: 0 Medications given: albumin  25 gm IV  Post HD weight: 71.6 kg  [facility]

## 2024-08-29 NOTE — Progress Notes (Signed)
 [facility]  ROUNDING NOTE   Subjective:   Patient is known to our practice and receives outpatient dialysis treatments at [facility] on a MWF schedule, supervised by Dr. Marcelino.  Update: Patient seen and evaluated on dialysis. Verbal consent with son over the phone since patient cannot move right arm to sign. No issues with dialysis. Patient agitated with loss of function to right hand.  Update:   Patient seen and evaluated during dialysis   HEMODIALYSIS FLOWSHEET:  Blood Flow Rate (mL/min): 0 mL/min Arterial Pressure (mmHg): 48.28 mmHg Venous Pressure (mmHg): 38.98 mmHg TMP (mmHg): 1.61 mmHg Ultrafiltration Rate (mL/min): 0 mL/min Dialysate Flow Rate (mL/min): 299 ml/min Dialysis Fluid Bolus: Normal Saline Bolus Amount (mL): 100 mL  Tolerating treatment well   Objective:  Vital signs in last 24 hours:  Temp:  [97.3 F (36.3 C)-97.9 F (36.6 C)] 97.8 F (36.6 C) (12/05 1238) Pulse Rate:  [82-106] 91 (12/05 1300) Resp:  [15-25] 19 (12/05 1238) BP: (72-134)/(37-66) 87/43 (12/05 1300) SpO2:  [100 %] 100 % (12/05 1300) Weight:  [70.4 kg-71.6 kg] 71.6 kg (12/05 1238)  Weight change:  Filed Weights   08/27/24 0804 08/29/24 0838 08/29/24 1238  Weight: 74.4 kg 70.4 kg 71.6 kg    Intake/Output: No intake/output data recorded.   Intake/Output this shift:  No intake/output data recorded.  Physical Exam: General: NAD  Head: Normocephalic  Lungs:  Clear, room air  Heart: Regular   Abdomen:  Soft  Extremities: No peripheral edema. Rt arm weakness  Neurologic: Alert, awake, conversant  Skin: dry  Access: Rt chest Permcath    Basic Metabolic Panel: Recent Labs  Lab 08/25/24 0940 08/27/24 0837 08/29/24 0850  NA 130* 132* 129*  K 4.9 4.3 4.2  CL 90* 91* 88*  CO2 24 23 26   GLUCOSE 92 76 106*  BUN 43* 32* 41*  CREATININE 6.84* 6.11* 5.87*  CALCIUM  8.8* 9.4 9.7  PHOS 6.2* 5.7* 4.6    Liver Function Tests: Recent Labs  Lab 08/25/24 0940  08/27/24 0837 08/29/24 0850  ALBUMIN  4.1 4.0 4.2   No results for input(s): LIPASE, AMYLASE in the last 168 hours. No results for input(s): AMMONIA in the last 168 hours.  CBC: Recent Labs  Lab 08/25/24 1115 08/27/24 0837 08/29/24 0850  WBC 7.1 9.2 10.8*  NEUTROABS 4.8 6.4  --   HGB 11.3* 11.1* 11.3*  HCT 35.3* 35.6* 34.3*  MCV 84.2 86.0 83.3  PLT 350 383 366    Cardiac Enzymes: No results for input(s): CKTOTAL, CKMB, CKMBINDEX, TROPONINI in the last 168 hours.  BNP: Invalid input(s): POCBNP  CBG: Recent Labs  Lab 08/22/24 1619  GLUCAP 103*    Microbiology: Results for orders placed or performed during the hospital encounter of 01/20/22  Blood culture (routine x 2)     Status: None   Collection Time: 01/20/22  7:22 PM   Specimen: BLOOD  Result Value Ref Range Status   Specimen Description BLOOD BLOOD RIGHT HAND  Final   Special Requests   Final    BOTTLES DRAWN AEROBIC AND ANAEROBIC Blood Culture results may not be optimal due to an inadequate volume of blood received in culture bottles   Culture   Final    NO GROWTH 5 DAYS Performed at [facility], [address], [place], [place] [address]    Report Status 01/25/2022 FINAL  Final  Blood culture (routine x 2)     Status: None   Collection Time: 01/20/22  7:22 PM  Specimen: BLOOD  Result Value Ref Range Status   Specimen Description BLOOD BLOOD LEFT HAND  Final   Special Requests   Final    BOTTLES DRAWN AEROBIC AND ANAEROBIC Blood Culture results may not be optimal due to an inadequate volume of blood received in culture bottles   Culture   Final    NO GROWTH 5 DAYS Performed at [facility], [address] [place], [place] [address]    Report Status 01/25/2022 FINAL  Final  Resp Panel by RT-PCR (Flu A&B, Covid) Nasopharyngeal Swab     Status: None   Collection Time: 01/20/22  7:28 PM   Specimen: Nasopharyngeal Swab; Nasopharyngeal(NP) swabs in vial transport  medium  Result Value Ref Range Status   SARS Coronavirus 2 by RT PCR NEGATIVE NEGATIVE Final    Comment: (NOTE) SARS-CoV-2 target nucleic acids are NOT DETECTED.  The SARS-CoV-2 RNA is generally detectable in upper respiratory specimens during the acute phase of infection. The lowest concentration of SARS-CoV-2 viral copies this assay can detect is 138 copies/mL. A negative result does not preclude SARS-Cov-2 infection and should not be used as the sole basis for treatment or other patient management decisions. A negative result may occur with  improper specimen collection/handling, submission of specimen other than nasopharyngeal swab, presence of viral mutation(s) within the areas targeted by this assay, and inadequate number of viral copies(<138 copies/mL). A negative result must be combined with clinical observations, patient history, and epidemiological information. The expected result is Negative.  Fact Sheet for Patients:  bloggercourse.com  Fact Sheet for Healthcare Providers:  seriousbroker.it  This test is no t yet approved or cleared by the [place]  FDA and  has been authorized for detection and/or diagnosis of SARS-CoV-2 by FDA under an Emergency Use Authorization (EUA). This EUA will remain  in effect (meaning this test can be used) for the duration of the COVID-19 declaration under Section 564(b)(1) of the Act, 21 U.S.C.section 360bbb-3(b)(1), unless the authorization is terminated  or revoked sooner.       Influenza A by PCR NEGATIVE NEGATIVE Final   Influenza B by PCR NEGATIVE NEGATIVE Final    Comment: (NOTE) The Xpert Xpress SARS-CoV-2/FLU/RSV plus assay is intended as an aid in the diagnosis of influenza from Nasopharyngeal swab specimens and should not be used as a sole basis for treatment. Nasal washings and aspirates are unacceptable for Xpert Xpress SARS-CoV-2/FLU/RSV testing.  Fact Sheet for  Patients: bloggercourse.com  Fact Sheet for Healthcare Providers: seriousbroker.it  This test is not yet approved or cleared by the [place]  FDA and has been authorized for detection and/or diagnosis of SARS-CoV-2 by FDA under an Emergency Use Authorization (EUA). This EUA will remain in effect (meaning this test can be used) for the duration of the COVID-19 declaration under Section 564(b)(1) of the Act, 21 U.S.C. section 360bbb-3(b)(1), unless the authorization is terminated or revoked.  Performed at [facility], [address] [place], [place] [address]     Coagulation Studies: No results for input(s): LABPROT, INR in the last 72 hours.   Urinalysis: No results for input(s): COLORURINE, LABSPEC, PHURINE, GLUCOSEU, HGBUR, BILIRUBINUR, KETONESUR, PROTEINUR, UROBILINOGEN, NITRITE, LEUKOCYTESUR in the last 72 hours.  Invalid input(s): APPERANCEUR    Imaging: No results found.     Medications:     aspirin  EC  81 mg Oral Daily   atorvastatin   40 mg Oral Daily   calcium  acetate  667 mg Oral TID WC   Chlorhexidine  Gluconate Cloth  6 each Topical Q0600   clopidogrel   75 mg Oral Daily   feeding supplement (NEPRO CARB STEADY)  237 mL Oral BID BM   heparin  injection (subcutaneous)  5,000 Units Subcutaneous Q8H   midodrine   5 mg Oral Once per day on Monday Wednesday Friday   pantoprazole   40 mg Oral QHS   sertraline   100 mg Oral Daily   sodium chloride  flush  3 mL Intravenous Once   acetaminophen  **OR** acetaminophen  (TYLENOL ) oral liquid 160 mg/5 mL **OR** acetaminophen , ammonium lactate , diclofenac  Sodium, ondansetron  (ZOFRAN ) IV, ondansetron , senna-docusate, traMADol   Assessment/ Plan:  [name] is a 73 y.o.  female with a past medical history of ESRD on dialysis, CVA, coronary artery disease, status post PCI, CHF with reduced ejection fraction, diabetes mellitus,  and hypertension, Patient presents to the ED from her dialysis center with acute onset of slurred speech and facial droop there. She was also experiencing worsened weakness/numbness of the right upper extremity relative to her baseline. Code Stroke was called.    [facility]/MWF/Rt PC/74.0 kg   End-stage renal disease on hemodialysis.   Received dialysis today, due to hypotension, unable to remove fluid. Patient received Albumin  25g with dialysis for blood pressure support. Next treatment scheduled for Monday.   Acute versus subacute ischemic CVA anterior left temporal lobe status post tenecteplase  administration.  History of recurrent CVA. Loop recorder placed on 08/08/23. Prescribed aspirin  and Plavix  outpatient. CT head-likely acute or subacute nonhemorrhagic infarct in the anterior left temporal lobe measuring up to moderate confluent subcortical white matter hypoattenuation stable.  MRI brain- Acute nonhemorrhagic infarct in the posterior left frontal lobe involving the primary motor cortex, with associated T2/FLAIR hyperintensity. Confluent periventricular and subcortical T2 hyperintensities bilaterally, increased from the prior study. Remote infarcts in the anterior right insular cortex, right frontal lobe operculum, and lateral left temporal lobe are new since the prior exam. Plavix  and aspirin  continued per neurology.    3. Anemia of chronic kidney disease Hemoglobin & Hematocrit     Component Value Date/Time   HGB 11.3 (L) 08/29/2024 0850   HCT 34.3 (L) 08/29/2024 0850    Patient receives Mircera at outpatient clinic.  Hgb remains stable   4. Secondary Hyperparathyroidism: with outpatient labs: PTH 652, phosphorus 5.2, corr calcium  08/06/2024.  Patient prescribed calcitriol, Cinacalcet and calcium  acetate outpatient.  Update: Bone minerals well controlled. Continue calcium  acetate with meals.   5.  Hypertension chronic kidney disease  Patient currently not on any  antihypertensive medications. Experienced symptomatic hypotension during dialysis. Given IV Albumin  for blood pressure support. May consider Midodrine  with dialysis.      LOS: 14 Nicholai Willette 12/5/20252:20 PM

## 2024-08-29 NOTE — Progress Notes (Signed)
 [facility]  - [place] at [facility]   PATIENT NAME: Deanna [name]    MR#:  [id number]  DATE OF BIRTH:  [birth year]  SUBJECTIVE:  no family at bedside. Patient sitting at the edge of the bed. Blood pressure bit soft just got back from dialysis patient remains  asymptomatic. Discussed with patient regarding getting echo as part of stroke workup she is agreeable    VITALS:  Blood pressure (!) 87/43, pulse 91, temperature 97.8 F (36.6 C), temperature source Oral, resp. rate 19, height 5' 1 (1.549 m), weight 71.6 kg, SpO2 100%.  PHYSICAL EXAMINATION:  limited GENERAL:  73 y.o.-year-old patient with no acute distress.  LUNGS: Normal breath sounds bilaterally, no wheezing CARDIOVASCULAR: S1, S2 normal. No murmur   ABDOMEN: Soft, nontender, nondistended. Bowel sounds present.  EXTREMITIES: No  edema b/l.    NEUROLOGIC: nonfocal  patient is alert and awake generalized weakness SKIN: per RN  LABORATORY PANEL:  CBC Recent Labs  Lab 08/29/24 0850  WBC 10.8*  HGB 11.3*  HCT 34.3*  PLT 366    Chemistries  Recent Labs  Lab 08/29/24 0850  NA 129*  K 4.2  CL 88*  CO2 26  GLUCOSE 106*  BUN 41*  CREATININE 5.87*  CALCIUM  9.7   Deanna [name] is a 73 y.o. female with medical history significant for CVA, ESRD on HD, extensive PAD s/p angioplasty and stent, history of HFrEF with recovered EF, hypertension, hyperlipidemia, type 2 diabetes, CAD, DISH arthritis, C3 anterior osteophyte fracture, was brought into the hospital from outpatient dialysis center due to right facial droop and slurred speech.  She also had numbness and weakness in the right upper extremity.  Patient was seen by neurology and was given TNK and was initially admitted to the ICU.  At this time, patient has been stable and awaiting for skilled nursing facility.        Acute left frontal lobe nonhemorrhagic stroke --Patient underwent tenecteplase  on 08/15/2024 at 3:15 PM.  Followed by neurology  during hospitalization follow-up brain scan showed acute nonhemorrhagic hemorrhagic infarct in the posterior left frontal lobe.  Neurology signed off --Continue aspirin , Plavix  and Lipitor.   --At this time physical therapy has seen the patient and recommended skilled nursing facility on discharge. --echo ordered to complete CVA w/u   ESRD on HD --Nephrology followed the patient during hospitalization for hemodialysis needs.  Continue dialysis as per nephrology.   Type II DM -- Diet controlled.  Last hemoglobin A1c of 5.7.     hyperlipidemia,  --Continue Lipitor   hypertension,  --Not on antihypertensives.  Was on metoprolol  at home including midodrine .  Latest blood pressure at 83/59.  Might need midodrine  if continues to be low.   PAD s/p stent, CAD,  --Continue aspirin  and Lipitor   History of CVA. --Continue aspirin  Lipitor   Class 1 obesity : Body mass index is 29.58 kg/m. --Patient will benefit from lifestyle modifications  DVT prophylaxis:  --heparin  injection      Procedures: Family communication :none Consults : neurology, nephrology CODE STATUS: full DVT Prophylaxis : heparin  Level of care: Med-Surg Status is: Inpatient Remains inpatient appropriate because: awaiting rehab bed!    TOTAL TIME TAKING CARE OF THIS PATIENT: 35 minutes.  >50% time spent on counselling and coordination of care  Note: This dictation was prepared with Dragon dictation along with smaller phrase technology. Any transcriptional errors that result from this process are unintentional.  Leita Blanch M.D    [facility]  CC: Primary care physician; Brutus Delon SAUNDERS, NP

## 2024-08-29 NOTE — Progress Notes (Signed)
 Occupational Therapy Treatment Patient Details Name: Deanna [name] MRN: [id number] DOB: 1951/09/18 Today's Date: 08/29/2024   History of present illness [name] is a 73 y.o. female with a past medical history of ESRD on dialysis, CAD, CVA, status post PCI, CHF with reduced ejection fraction, DM, HTN. Patient presents to the ED from her dialysis center with acute onset of slurred speech and facial droop there. She was also experiencing worsened weakness/numbness of the right upper extremity relative to her baseline. Found to have acute left frontal lobe nonhemorrhagic stroke: S/p tenecteplase  on 08/15/2024. Follow-up MRI brain showed acute nonhemorrhagic infarct in the posterior left frontal lobe.   OT comments  Patient seen for OT treatment on this date. Upon arrival to room patient sitting EOB, agreeable to treatment. Patient required max A to scoot EOB in prep for SPT. Patient performed sit<>stand with max A for lifting, knees blocked; heavy lift A, SPT to recliner with patient able to initiate lowering into recliner but required max A to lower safely to recliner. OT provided education for self PROM to R hand with patient able to return demo (see below) while sitting in recliner. OT educated on proper positioning of hemiplegic UE with patient tolerating.  Patient ended treatment in recliner with bed/chair alarm on and all needs within reach. Patient making good progress toward goals, will continue to follow POC. Goals updated. Discharge recommendation remains appropriate.        If plan is discharge home, recommend the following:  Assist for transportation;Direct supervision/assist for financial management;Direct supervision/assist for medications management;Assistance with cooking/housework;Two people to help with walking and/or transfers;Two people to help with bathing/dressing/bathroom   Equipment Recommendations  None recommended by OT    Recommendations for Other Services       Precautions / Restrictions Precautions Precautions: Fall Recall of Precautions/Restrictions: Impaired Restrictions Weight Bearing Restrictions Per Provider Order: No       Mobility Bed Mobility               General bed mobility comments: sitting EOB upon OT arrival, max A to scoot closer to EOB with A to weight shift into LLE    Transfers Overall transfer level: Needs assistance Equipment used: 1 person hand held assist Transfers: Bed to chair/wheelchair/BSC Sit to Stand: Max assist, From elevated surface Stand pivot transfers: Max assist, From elevated surface         General transfer comment: heavy lifting A from EOB to [facility]     Balance Overall balance assessment: Needs assistance Sitting-balance support: Feet supported Sitting balance-[name]: Fair Sitting balance - Comments: not able to use RUE to supports   Standing balance support: During functional activity Standing balance-[name]: Poor                             ADL either performed or assessed with clinical judgement   ADL                                              Extremity/Trunk Assessment Upper Extremity Assessment Upper Extremity Assessment: Right hand dominant;RUE deficits/detail RUE Deficits / Details: no active grip; educated on PROM digit flexion with washcloth for feedback, patient demonstrated good return 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     Communication Communication Communication: Impaired Factors Affecting Communication: Reduced clarity of speech   Cognition Arousal: Alert Behavior During Therapy: Flat affect Cognition: No family/caregiver present to determine baseline                               Following commands: Intact Following commands impaired: Follows one step commands with increased time      Cueing   Cueing Techniques: Verbal cues, Tactile cues  Exercises General Exercises -  Upper Extremity Wrist Flexion: PROM, 5 reps, Seated, Other (comment) (self) Wrist Extension: PROM, 5 reps, Seated (self) Digit Composite Flexion: PROM, Seated, 5 reps (self)    Shoulder Instructions       General Comments      Pertinent Vitals/ Pain       Pain Assessment Pain Assessment: No/denies pain  Home Living                                          Prior Functioning/Environment              Frequency  Min 2X/week        Progress Toward Goals  OT Goals(current goals can now be found in the care plan section)  Progress towards OT goals: Progressing toward goals  Acute Rehab OT Goals Patient Stated Goal: to use my hand better OT Goal Formulation: With patient Time For Goal Achievement: 09/12/24 Potential to Achieve Goals: Good ADL Goals Pt Will Perform Grooming: sitting;with set-up Pt Will Perform Lower Body Dressing: sitting/lateral leans;with min assist Pt Will Transfer to Toilet: ambulating;with mod assist Pt Will Perform Toileting - Clothing Manipulation and hygiene: sit to/from stand;with mod assist  Plan      Co-evaluation                 AM-PAC OT 6 Clicks Daily Activity     Outcome Measure   Help from another person eating meals?: A Little Help from another person taking care of personal grooming?: A Lot Help from another person toileting, which includes using toliet, bedpan, or urinal?: A Lot Help from another person bathing (including washing, rinsing, drying)?: A Lot Help from another person to put on and taking off regular upper body clothing?: A Little Help from another person to put on and taking off regular lower body clothing?: A Lot 6 Click Score: 14    End of Session Equipment Utilized During Treatment: Gait belt  OT Visit Diagnosis: Unsteadiness on feet (R26.81);Other abnormalities of gait and mobility (R26.89);Repeated falls (R29.6);Muscle weakness (generalized) (M62.81);Hemiplegia and  hemiparesis Hemiplegia - Right/Left: Right Hemiplegia - dominant/non-dominant: Dominant Hemiplegia - caused by: Cerebral infarction   Activity Tolerance Patient limited by fatigue   Patient Left in chair;with call bell/phone within reach;with chair alarm set   Nurse Communication          Time: 8572-8551 OT Time Calculation (min): 21 min  Charges: OT General Charges $OT Visit: 1 Visit OT Treatments $Self Care/Home Management : 8-22 mins  Rogers Clause, OT/L MSOT, 08/29/2024

## 2024-08-30 ENCOUNTER — Inpatient Hospital Stay: Admit: 2024-08-30 | Discharge: 2024-08-30 | Disposition: A | Attending: Internal Medicine | Admitting: Internal Medicine

## 2024-08-30 DIAGNOSIS — I639 Cerebral infarction, unspecified: Secondary | ICD-10-CM | POA: Diagnosis not present

## 2024-08-30 NOTE — Progress Notes (Signed)
 [facility]  ROUNDING NOTE   Subjective:   Patient is known to our practice and receives outpatient dialysis treatments at [facility] on a MWF schedule, supervised by Dr. Marcelino.  Update: Patient seen and evaluated on dialysis. Verbal consent with son over the phone since patient cannot move right arm to sign. No issues with dialysis. Patient agitated with loss of function to right hand.  Update:   Patient seen sitting in chair States her back pain has improved Frustrated with discharge plan   Objective:  Vital signs in last 24 hours:  Temp:  [97.4 F (36.3 C)-98.2 F (36.8 C)] 98.2 F (36.8 C) (12/06 0724) Pulse Rate:  [80-91] 84 (12/06 0724) Resp:  [12-19] 16 (12/06 0724) BP: (87-108)/(43-56) 104/44 (12/06 0724) SpO2:  [100 %] 100 % (12/06 0724) Weight:  [71.6 kg] 71.6 kg (12/05 1238)  Weight change:  Filed Weights   08/27/24 0804 08/29/24 0838 08/29/24 1238  Weight: 74.4 kg 70.4 kg 71.6 kg    Intake/Output: I/O last 3 completed shifts: In: 240 [P.O.:240] Out: 0    Intake/Output this shift:  No intake/output data recorded.  Physical Exam: General: NAD  Head: Normocephalic  Lungs:  Clear, room air  Heart: Regular   Abdomen:  Soft  Extremities: No peripheral edema. Rt arm weakness  Neurologic: Alert, awake, conversant  Skin: dry  Access: Rt chest Permcath    Basic Metabolic Panel: Recent Labs  Lab 08/25/24 0940 08/27/24 0837 08/29/24 0850  NA 130* 132* 129*  K 4.9 4.3 4.2  CL 90* 91* 88*  CO2 24 23 26   GLUCOSE 92 76 106*  BUN 43* 32* 41*  CREATININE 6.84* 6.11* 5.87*  CALCIUM  8.8* 9.4 9.7  PHOS 6.2* 5.7* 4.6    Liver Function Tests: Recent Labs  Lab 08/25/24 0940 08/27/24 0837 08/29/24 0850  ALBUMIN  4.1 4.0 4.2   No results for input(s): LIPASE, AMYLASE in the last 168 hours. No results for input(s): AMMONIA in the last 168 hours.  CBC: Recent Labs  Lab 08/25/24 1115 08/27/24 0837 08/29/24 0850  WBC 7.1 9.2  10.8*  NEUTROABS 4.8 6.4  --   HGB 11.3* 11.1* 11.3*  HCT 35.3* 35.6* 34.3*  MCV 84.2 86.0 83.3  PLT 350 383 366    Cardiac Enzymes: No results for input(s): CKTOTAL, CKMB, CKMBINDEX, TROPONINI in the last 168 hours.  BNP: Invalid input(s): POCBNP  CBG: No results for input(s): GLUCAP in the last 168 hours.   Microbiology: Results for orders placed or performed during the hospital encounter of 01/20/22  Blood culture (routine x 2)     Status: None   Collection Time: 01/20/22  7:22 PM   Specimen: BLOOD  Result Value Ref Range Status   Specimen Description BLOOD BLOOD RIGHT HAND  Final   Special Requests   Final    BOTTLES DRAWN AEROBIC AND ANAEROBIC Blood Culture results may not be optimal due to an inadequate volume of blood received in culture bottles   Culture   Final    NO GROWTH 5 DAYS Performed at [facility], [address] [place], [place] [address]    Report Status 01/25/2022 FINAL  Final  Blood culture (routine x 2)     Status: None   Collection Time: 01/20/22  7:22 PM   Specimen: BLOOD  Result Value Ref Range Status   Specimen Description BLOOD BLOOD LEFT HAND  Final   Special Requests   Final    BOTTLES DRAWN AEROBIC AND ANAEROBIC  Blood Culture results may not be optimal due to an inadequate volume of blood received in culture bottles   Culture   Final    NO GROWTH 5 DAYS Performed at [facility], [address] [place], [place] [address]    Report Status 01/25/2022 FINAL  Final  Resp Panel by RT-PCR (Flu A&B, Covid) Nasopharyngeal Swab     Status: None   Collection Time: 01/20/22  7:28 PM   Specimen: Nasopharyngeal Swab; Nasopharyngeal(NP) swabs in vial transport medium  Result Value Ref Range Status   SARS Coronavirus 2 by RT PCR NEGATIVE NEGATIVE Final    Comment: (NOTE) SARS-CoV-2 target nucleic acids are NOT DETECTED.  The SARS-CoV-2 RNA is generally detectable in upper respiratory specimens during the acute  phase of infection. The lowest concentration of SARS-CoV-2 viral copies this assay can detect is 138 copies/mL. A negative result does not preclude SARS-Cov-2 infection and should not be used as the sole basis for treatment or other patient management decisions. A negative result may occur with  improper specimen collection/handling, submission of specimen other than nasopharyngeal swab, presence of viral mutation(s) within the areas targeted by this assay, and inadequate number of viral copies(<138 copies/mL). A negative result must be combined with clinical observations, patient history, and epidemiological information. The expected result is Negative.  Fact Sheet for Patients:  bloggercourse.com  Fact Sheet for Healthcare Providers:  seriousbroker.it  This test is no t yet approved or cleared by the [place]  FDA and  has been authorized for detection and/or diagnosis of SARS-CoV-2 by FDA under an Emergency Use Authorization (EUA). This EUA will remain  in effect (meaning this test can be used) for the duration of the COVID-19 declaration under Section 564(b)(1) of the Act, 21 U.S.C.section 360bbb-3(b)(1), unless the authorization is terminated  or revoked sooner.       Influenza A by PCR NEGATIVE NEGATIVE Final   Influenza B by PCR NEGATIVE NEGATIVE Final    Comment: (NOTE) The Xpert Xpress SARS-CoV-2/FLU/RSV plus assay is intended as an aid in the diagnosis of influenza from Nasopharyngeal swab specimens and should not be used as a sole basis for treatment. Nasal washings and aspirates are unacceptable for Xpert Xpress SARS-CoV-2/FLU/RSV testing.  Fact Sheet for Patients: bloggercourse.com  Fact Sheet for Healthcare Providers: seriousbroker.it  This test is not yet approved or cleared by the [place]  FDA and has been authorized for detection and/or diagnosis of  SARS-CoV-2 by FDA under an Emergency Use Authorization (EUA). This EUA will remain in effect (meaning this test can be used) for the duration of the COVID-19 declaration under Section 564(b)(1) of the Act, 21 U.S.C. section 360bbb-3(b)(1), unless the authorization is terminated or revoked.  Performed at [facility], [address] [place], [place] [address]     Coagulation Studies: No results for input(s): LABPROT, INR in the last 72 hours.   Urinalysis: No results for input(s): COLORURINE, LABSPEC, PHURINE, GLUCOSEU, HGBUR, BILIRUBINUR, KETONESUR, PROTEINUR, UROBILINOGEN, NITRITE, LEUKOCYTESUR in the last 72 hours.  Invalid input(s): APPERANCEUR    Imaging: No results found.     Medications:     aspirin  EC  81 mg Oral Daily   atorvastatin   40 mg Oral Daily   calcium  acetate  667 mg Oral TID WC   Chlorhexidine  Gluconate Cloth  6 each Topical Q0600   clopidogrel   75 mg Oral Daily   feeding supplement (NEPRO CARB STEADY)  237 mL Oral BID BM   heparin  injection (subcutaneous)  5,000  Units Subcutaneous Q8H   midodrine   5 mg Oral Once per day on Monday Wednesday Friday   pantoprazole   40 mg Oral QHS   sertraline   100 mg Oral Daily   sodium chloride  flush  3 mL Intravenous Once   acetaminophen  **OR** acetaminophen  (TYLENOL ) oral liquid 160 mg/5 mL **OR** acetaminophen , ammonium lactate , diclofenac  Sodium, ondansetron  (ZOFRAN ) IV, ondansetron , senna-docusate, traMADol   Assessment/ Plan:  [name] is a 73 y.o.  female with a past medical history of ESRD on dialysis, CVA, coronary artery disease, status post PCI, CHF with reduced ejection fraction, diabetes mellitus, and hypertension, Patient presents to the ED from her dialysis center with acute onset of slurred speech and facial droop there. She was also experiencing worsened weakness/numbness of the right upper extremity relative to her baseline. Code Stroke was called.     [facility]/MWF/Rt PC/74.0 kg   End-stage renal disease on hemodialysis.   Dialysis received yesterday, experienced hypotension during treatment. No fluid pull. Next treatment scheduled for Monday. Monitoring discharge plan.   Acute versus subacute ischemic CVA anterior left temporal lobe status post tenecteplase  administration.  History of recurrent CVA. Loop recorder placed on 08/08/23. Prescribed aspirin  and Plavix  outpatient. CT head-likely acute or subacute nonhemorrhagic infarct in the anterior left temporal lobe measuring up to moderate confluent subcortical white matter hypoattenuation stable.  MRI brain- Acute nonhemorrhagic infarct in the posterior left frontal lobe involving the primary motor cortex, with associated T2/FLAIR hyperintensity. Confluent periventricular and subcortical T2 hyperintensities bilaterally, increased from the prior study. Remote infarcts in the anterior right insular cortex, right frontal lobe operculum, and lateral left temporal lobe are new since the prior exam. Plavix  and aspirin  continued per neurology.    3. Anemia of chronic kidney disease Hemoglobin & Hematocrit     Component Value Date/Time   HGB 11.3 (L) 08/29/2024 0850   HCT 34.3 (L) 08/29/2024 0850    Patient receives Mircera at outpatient clinic.  Hgb acceptable   4. Secondary Hyperparathyroidism: with outpatient labs: PTH 652, phosphorus 5.2, corr calcium  08/06/2024.  Patient prescribed calcitriol, Cinacalcet and calcium  acetate outpatient.  Update: Bone minerals well controlled. Continue calcium  acetate with meals.   5.  Hypertension chronic kidney disease  Patient currently not on any antihypertensive medications. Experienced symptomatic hypotension during dialysis. Given IV Albumin  for blood pressure support. May consider Midodrine  with dialysis.      LOS: 15 Deanna [name] 12/6/202512:33 PM

## 2024-08-30 NOTE — Plan of Care (Signed)
  Problem: Education: Goal: Knowledge of disease or condition will improve Outcome: Not Progressing Goal: Knowledge of secondary prevention will improve (MUST DOCUMENT ALL) Outcome: Not Progressing Goal: Knowledge of patient specific risk factors will improve (DELETE if not current risk factor) Outcome: Not Progressing   Problem: Ischemic Stroke/TIA Tissue Perfusion: Goal: Complications of ischemic stroke/TIA will be minimized Outcome: Not Progressing   Problem: Coping: Goal: Will verbalize positive feelings about self Outcome: Not Progressing Goal: Will identify appropriate support needs Outcome: Not Progressing   Problem: Health Behavior/Discharge Planning: Goal: Ability to manage health-related needs will improve Outcome: Not Progressing Goal: Goals will be collaboratively established with patient/family Outcome: Not Progressing   Problem: Self-Care: Goal: Ability to participate in self-care as condition permits will improve Outcome: Not Progressing Goal: Verbalization of feelings and concerns over difficulty with self-care will improve Outcome: Not Progressing Goal: Ability to communicate needs accurately will improve Outcome: Not Progressing   Problem: Nutrition: Goal: Risk of aspiration will decrease Outcome: Not Progressing Goal: Dietary intake will improve Outcome: Not Progressing   Problem: Education: Goal: Knowledge of General Education information will improve Description: Including pain rating scale, medication(s)/side effects and non-pharmacologic comfort measures Outcome: Not Progressing   Problem: Health Behavior/Discharge Planning: Goal: Ability to manage health-related needs will improve Outcome: Not Progressing   Problem: Clinical Measurements: Goal: Ability to maintain clinical measurements within normal limits will improve Outcome: Not Progressing Goal: Will remain free from infection Outcome: Not Progressing Goal: Diagnostic test results will  improve Outcome: Not Progressing Goal: Respiratory complications will improve Outcome: Not Progressing Goal: Cardiovascular complication will be avoided Outcome: Not Progressing   Problem: Activity: Goal: Risk for activity intolerance will decrease Outcome: Not Progressing   Problem: Nutrition: Goal: Adequate nutrition will be maintained Outcome: Not Progressing   Problem: Coping: Goal: Level of anxiety will decrease Outcome: Not Progressing   Problem: Elimination: Goal: Will not experience complications related to bowel motility Outcome: Not Progressing Goal: Will not experience complications related to urinary retention Outcome: Not Progressing   Problem: Pain Managment: Goal: General experience of comfort will improve and/or be controlled Outcome: Not Progressing   Problem: Safety: Goal: Ability to remain free from injury will improve Outcome: Not Progressing   Problem: Skin Integrity: Goal: Risk for impaired skin integrity will decrease Outcome: Not Progressing   Problem: Education: Goal: Ability to describe self-care measures that may prevent or decrease complications (Diabetes Survival Skills Education) will improve Outcome: Not Progressing Goal: Individualized Educational Video(s) Outcome: Not Progressing   Problem: Coping: Goal: Ability to adjust to condition or change in health will improve Outcome: Not Progressing   Problem: Fluid Volume: Goal: Ability to maintain a balanced intake and output will improve Outcome: Not Progressing   Problem: Health Behavior/Discharge Planning: Goal: Ability to identify and utilize available resources and services will improve Outcome: Not Progressing Goal: Ability to manage health-related needs will improve Outcome: Not Progressing   Problem: Metabolic: Goal: Ability to maintain appropriate glucose levels will improve Outcome: Not Progressing   Problem: Nutritional: Goal: Maintenance of adequate nutrition will  improve Outcome: Not Progressing Goal: Progress toward achieving an optimal weight will improve Outcome: Not Progressing   Problem: Skin Integrity: Goal: Risk for impaired skin integrity will decrease Outcome: Not Progressing   Problem: Tissue Perfusion: Goal: Adequacy of tissue perfusion will improve Outcome: Not Progressing

## 2024-08-30 NOTE — Progress Notes (Signed)
 [facility]  - [place] [facility]   PATIENT NAME: Deanna [name]    MR#:  [id number]  DATE OF BIRTH:  [birth year]  SUBJECTIVE:  no family at bedside. Patient sitting at the edge of the bed. BP stable    VITALS:  Blood pressure (!) 104/44, pulse 84, temperature 98.2 F (36.8 C), temperature source Oral, resp. rate 16, height 5' 1 (1.549 m), weight 71.6 kg, SpO2 100%.  PHYSICAL EXAMINATION:  limited GENERAL:  73 y.o.-year-old patient with no acute distress.  LUNGS: Normal breath sounds bilaterally, no wheezing CARDIOVASCULAR: S1, S2 normal. No murmur   ABDOMEN: Soft, nontender, nondistended. EXTREMITIES: No  edema b/l.    NEUROLOGIC: nonfocal  patient is alert and awake generalized weakness SKIN: per RN  LABORATORY PANEL:  CBC Recent Labs  Lab 08/29/24 0850  WBC 10.8*  HGB 11.3*  HCT 34.3*  PLT 366    Chemistries  Recent Labs  Lab 08/29/24 0850  NA 129*  K 4.2  CL 88*  CO2 26  GLUCOSE 106*  BUN 41*  CREATININE 5.87*  CALCIUM  9.7   Deanna [name] is a 73 y.o. female with medical history significant for CVA, ESRD on HD, extensive PAD s/p angioplasty and stent, history of HFrEF with recovered EF, hypertension, hyperlipidemia, type 2 diabetes, CAD, DISH arthritis, C3 anterior osteophyte fracture, was brought into the hospital from outpatient dialysis center due to right facial droop and slurred speech.  She also had numbness and weakness in the right upper extremity.  Patient was seen by neurology and was given TNK and was initially admitted to the ICU.  At this time, patient has been stable and awaiting for skilled nursing facility.        Acute left frontal lobe nonhemorrhagic stroke --Patient underwent tenecteplase  on 08/15/2024 at 3:15 PM.  Followed by neurology during hospitalization follow-up brain scan showed acute nonhemorrhagic hemorrhagic infarct in the posterior left frontal lobe.  Neurology signed off --Continue aspirin , Plavix  and  Lipitor.   --At this time physical therapy has seen the patient and recommended skilled nursing facility on discharge. --echo ordered to complete CVA w/u   ESRD on HD --Nephrology followed the patient during hospitalization for hemodialysis needs.  Continue dialysis as per nephrology.   Type II DM -- Diet controlled.  Last hemoglobin A1c of 5.7.     hyperlipidemia,  --Continue Lipitor   hypertension,  --Not on antihypertensives.  Was on metoprolol  at home including midodrine .  Latest blood pressure at 83/59.  Might need midodrine  if continues to be low.   PAD s/p stent, CAD,  --Continue aspirin  and Lipitor   History of CVA. --Continue aspirin  Lipitor   Class 1 obesity : Body mass index is 29.58 kg/m. --Patient will benefit from lifestyle modifications  DVT prophylaxis:  --heparin  injection      Procedures: Family communication :none Consults : neurology, nephrology CODE STATUS: full DVT Prophylaxis : heparin  Level of care: Med-Surg Status is: Inpatient Remains inpatient appropriate because: awaiting rehab bed!    TOTAL TIME TAKING CARE OF THIS PATIENT: 25 minutes.  >50% time spent on counselling and coordination of care  Note: This dictation was prepared with Dragon dictation along with smaller phrase technology. Any transcriptional errors that result from this process are unintentional.  Leita Blanch M.D    [facility]   CC: Primary care physician; Brutus Delon SAUNDERS, NP

## 2024-08-30 NOTE — Progress Notes (Addendum)
 The patient refused her 0100am Heparin  SQ injection. This clinical research associate explained the importance of taking the Heparin . The patient c/o having right hip pain but refused to take anything when this writer offered her PRN Tramadol  or Tylenol . This writer told the patient to let me know if she decided to take something for pain.

## 2024-08-30 NOTE — Progress Notes (Signed)
  Echocardiogram 2D Echocardiogram has NOT been performed. When I entered the room and introduced myself the patient became irate and began yelling for me to  leave the room and threaten to throw something at me if I did not. Please consider canceling this study.   Deanna [name] Louder 08/30/2024, 5:06 PM

## 2024-08-30 NOTE — Plan of Care (Signed)

## 2024-08-31 NOTE — Progress Notes (Signed)
 PROGRESS NOTE    Deanna [name]  FMW:[id number] DOB: 09/02/1951 DOA: 08/15/2024 PCP: Brutus Delon SAUNDERS, NP  Chief Complaint  Patient presents with   Code Stroke    Hospital Course:  Deanna [name] is a 73 y.o. female with medical history significant for CVA, ESRD on HD, extensive PAD s/p angioplasty and stent, history of HFrEF with recovered EF, hypertension, hyperlipidemia, type 2 diabetes, CAD, DISH arthritis, C3 anterior osteophyte fracture, was brought into the hospital from outpatient dialysis center due to right facial droop and slurred speech. She also had numbness and weakness in the right upper extremity. Patient was seen by neurology and was given TNK and was initially admitted to the ICU. At this time, patient has been stable and awaiting for skilled nursing facility.   Subjective: No acute events overnight.  This morning patient reports she is feeling fine, denies any acute complaints.   Objective: Vitals:   08/30/24 0437 08/30/24 0724 08/30/24 1601 08/31/24 0527  BP: (!) 108/56 (!) 104/44 (!) 114/57 (!) 113/53  Pulse: 86 84 82 90  Resp: 12 16 16 18   Temp: (!) 97.4 F (36.3 C) 98.2 F (36.8 C) (!) 97.5 F (36.4 C) 98.6 F (37 C)  TempSrc: Oral Oral Oral Oral  SpO2: 100% 100% 100% 100%  Weight:      Height:        Intake/Output Summary (Last 24 hours) at 08/31/2024 0825 Last data filed at 08/30/2024 1000 Gross per 24 hour  Intake 0 ml  Output --  Net 0 ml   Filed Weights   08/27/24 0804 08/29/24 0838 08/29/24 1238  Weight: 74.4 kg 70.4 kg 71.6 kg    Examination: General exam: Appears calm and comfortable, NAD  Respiratory system: No work of breathing, symmetric chest wall expansion Cardiovascular system: S1 & S2 heard, RRR.  Gastrointestinal system: Abdomen is nondistended, soft and nontender.  Neuro: Alert and oriented Psychiatry: Demonstrates appropriate judgement and insight. Mood & affect appropriate for situation.   Assessment & Plan:   Principal Problem:   Stroke (cerebrum) (HCC)    Acute left frontal lobe nonhemorrhagic CVA - Received TNK at 11/21 at 3:15 PM - Was followed by neurology during this hospitalization - Follow-up MRI scan shows acute nonhemorrhagic infarct in posterior left frontal lobe, as well as remote infarcts in the anterior right insular cortex and right frontal lobe  - Neurology has since signed off - Continue with aspirin , Plavix , statin - PT recommending SNF - Echocardiogram: Still pending.  Have asked RN to reach out to tech.  ESRD on HD Hyponatremia - Nephrology consulted for ongoing HD during hospital stay  Type 2 diabetes, diet controlled - Last hemoglobin A1c 5.7% - No indication for sliding scale at this time  Hyperlipidemia - Continue statin  Hypotension - On midodrine  at home.  Currently receiving Monday Wednesday Friday with dialysis.  May need daily  PAD status post stent CAD - Continue aspirin  and statin  History of CVA - Continue aspirin  and statin  Overweight BMI 29 - Outpatient follow up for lifestyle modification and risk factor management   DVT prophylaxis: Heparin    Code Status: Full Code Disposition: Needs SNF placement, TOC consulted  Consultants:    Procedures:    Antimicrobials:  Anti-infectives (From admission, onward)    None       Data Reviewed: I have personally reviewed following labs and imaging studies CBC: Recent Labs  Lab 08/25/24 1115 08/27/24 0837 08/29/24 0850  WBC 7.1 9.2 10.8*  NEUTROABS  4.8 6.4  --   HGB 11.3* 11.1* 11.3*  HCT 35.3* 35.6* 34.3*  MCV 84.2 86.0 83.3  PLT 350 383 366   Basic Metabolic Panel: Recent Labs  Lab 08/25/24 0940 08/27/24 0837 08/29/24 0850  NA 130* 132* 129*  K 4.9 4.3 4.2  CL 90* 91* 88*  CO2 24 23 26   GLUCOSE 92 76 106*  BUN 43* 32* 41*  CREATININE 6.84* 6.11* 5.87*  CALCIUM  8.8* 9.4 9.7  PHOS 6.2* 5.7* 4.6   GFR: Estimated Creatinine Clearance: 7.8 mL/min (A) (by C-G formula  based on SCr of 5.87 mg/dL (H)). Liver Function Tests: Recent Labs  Lab 08/25/24 0940 08/27/24 0837 08/29/24 0850  ALBUMIN  4.1 4.0 4.2   CBG: No results for input(s): GLUCAP in the last 168 hours.  No results found for this or any previous visit (from the past 240 hours).   Radiology Studies: No results found.  Scheduled Meds:  aspirin  EC  81 mg Oral Daily   atorvastatin   40 mg Oral Daily   calcium  acetate  667 mg Oral TID WC   Chlorhexidine  Gluconate Cloth  6 each Topical Q0600   clopidogrel   75 mg Oral Daily   feeding supplement (NEPRO CARB STEADY)  237 mL Oral BID BM   heparin  injection (subcutaneous)  5,000 Units Subcutaneous Q8H   midodrine   5 mg Oral Once per day on Monday Wednesday Friday   pantoprazole   40 mg Oral QHS   sertraline   100 mg Oral Daily   sodium chloride  flush  3 mL Intravenous Once   Continuous Infusions:   LOS: 16 days  MDM: Patient is high risk for one or more organ failure.  They necessitate ongoing hospitalization for continued IV therapies and subsequent lab monitoring. Total time spent interpreting labs and vitals, reviewing the medical record, coordinating care amongst consultants and care team members, directly assessing and discussing care with the patient and/or family: 55 min  Deanna Lashley, DO [facility]  To contact the attending physician between 7A-7P please use Epic Chat. To contact the covering physician during after hours 7P-7A, please review Amion.  08/31/2024, 8:25 AM   *This document has been created with the assistance of dictation software. Please excuse typographical errors. *

## 2024-08-31 NOTE — Progress Notes (Signed)
 [facility]  ROUNDING NOTE   Subjective:   Patient is known to our practice and receives outpatient dialysis treatments at [facility] on a MWF schedule, supervised by Dr. Marcelino.   Update:   Patient sitting at side of bed Alert and oriented Waiting for staff assistance back to bed Room air   Objective:  Vital signs in last 24 hours:  Temp:  [97.5 F (36.4 C)-98.6 F (37 C)] 97.8 F (36.6 C) (12/07 0911) Pulse Rate:  [82-90] 84 (12/07 0911) Resp:  [16-18] 16 (12/07 0911) BP: (108-114)/(53-57) 108/53 (12/07 0911) SpO2:  [100 %] 100 % (12/07 0911)  Weight change:  Filed Weights   08/27/24 0804 08/29/24 0838 08/29/24 1238  Weight: 74.4 kg 70.4 kg 71.6 kg    Intake/Output: No intake/output data recorded.   Intake/Output this shift:  Total I/O In: 240 [P.O.:240] Out: -   Physical Exam: General: NAD  Head: Normocephalic  Lungs:  Clear, room air  Heart: Regular   Abdomen:  Soft  Extremities: No peripheral edema. Rt arm weakness  Neurologic: Alert, awake, conversant  Skin: dry  Access: Rt chest Permcath    Basic Metabolic Panel: Recent Labs  Lab 08/25/24 0940 08/27/24 0837 08/29/24 0850  NA 130* 132* 129*  K 4.9 4.3 4.2  CL 90* 91* 88*  CO2 24 23 26   GLUCOSE 92 76 106*  BUN 43* 32* 41*  CREATININE 6.84* 6.11* 5.87*  CALCIUM  8.8* 9.4 9.7  PHOS 6.2* 5.7* 4.6    Liver Function Tests: Recent Labs  Lab 08/25/24 0940 08/27/24 0837 08/29/24 0850  ALBUMIN  4.1 4.0 4.2   No results for input(s): LIPASE, AMYLASE in the last 168 hours. No results for input(s): AMMONIA in the last 168 hours.  CBC: Recent Labs  Lab 08/25/24 1115 08/27/24 0837 08/29/24 0850  WBC 7.1 9.2 10.8*  NEUTROABS 4.8 6.4  --   HGB 11.3* 11.1* 11.3*  HCT 35.3* 35.6* 34.3*  MCV 84.2 86.0 83.3  PLT 350 383 366    Cardiac Enzymes: No results for input(s): CKTOTAL, CKMB, CKMBINDEX, TROPONINI in the last 168 hours.  BNP: Invalid input(s):  POCBNP  CBG: No results for input(s): GLUCAP in the last 168 hours.   Microbiology: Results for orders placed or performed during the hospital encounter of 01/20/22  Blood culture (routine x 2)     Status: None   Collection Time: 01/20/22  7:22 PM   Specimen: BLOOD  Result Value Ref Range Status   Specimen Description BLOOD BLOOD RIGHT HAND  Final   Special Requests   Final    BOTTLES DRAWN AEROBIC AND ANAEROBIC Blood Culture results may not be optimal due to an inadequate volume of blood received in culture bottles   Culture   Final    NO GROWTH 5 DAYS Performed at [facility], [address] [place], [place] [address]    Report Status 01/25/2022 FINAL  Final  Blood culture (routine x 2)     Status: None   Collection Time: 01/20/22  7:22 PM   Specimen: BLOOD  Result Value Ref Range Status   Specimen Description BLOOD BLOOD LEFT HAND  Final   Special Requests   Final    BOTTLES DRAWN AEROBIC AND ANAEROBIC Blood Culture results may not be optimal due to an inadequate volume of blood received in culture bottles   Culture   Final    NO GROWTH 5 DAYS Performed at [facility], [address], [place], [place] [address]  Report Status 01/25/2022 FINAL  Final  Resp Panel by RT-PCR (Flu A&B, Covid) Nasopharyngeal Swab     Status: None   Collection Time: 01/20/22  7:28 PM   Specimen: Nasopharyngeal Swab; Nasopharyngeal(NP) swabs in vial transport medium  Result Value Ref Range Status   SARS Coronavirus 2 by RT PCR NEGATIVE NEGATIVE Final    Comment: (NOTE) SARS-CoV-2 target nucleic acids are NOT DETECTED.  The SARS-CoV-2 RNA is generally detectable in upper respiratory specimens during the acute phase of infection. The lowest concentration of SARS-CoV-2 viral copies this assay can detect is 138 copies/mL. A negative result does not preclude SARS-Cov-2 infection and should not be used as the sole basis for treatment or other patient management  decisions. A negative result may occur with  improper specimen collection/handling, submission of specimen other than nasopharyngeal swab, presence of viral mutation(s) within the areas targeted by this assay, and inadequate number of viral copies(<138 copies/mL). A negative result must be combined with clinical observations, patient history, and epidemiological information. The expected result is Negative.  Fact Sheet for Patients:  bloggercourse.com  Fact Sheet for Healthcare Providers:  seriousbroker.it  This test is no t yet approved or cleared by the [place]  FDA and  has been authorized for detection and/or diagnosis of SARS-CoV-2 by FDA under an Emergency Use Authorization (EUA). This EUA will remain  in effect (meaning this test can be used) for the duration of the COVID-19 declaration under Section 564(b)(1) of the Act, 21 U.S.C.section 360bbb-3(b)(1), unless the authorization is terminated  or revoked sooner.       Influenza A by PCR NEGATIVE NEGATIVE Final   Influenza B by PCR NEGATIVE NEGATIVE Final    Comment: (NOTE) The Xpert Xpress SARS-CoV-2/FLU/RSV plus assay is intended as an aid in the diagnosis of influenza from Nasopharyngeal swab specimens and should not be used as a sole basis for treatment. Nasal washings and aspirates are unacceptable for Xpert Xpress SARS-CoV-2/FLU/RSV testing.  Fact Sheet for Patients: bloggercourse.com  Fact Sheet for Healthcare Providers: seriousbroker.it  This test is not yet approved or cleared by the [place]  FDA and has been authorized for detection and/or diagnosis of SARS-CoV-2 by FDA under an Emergency Use Authorization (EUA). This EUA will remain in effect (meaning this test can be used) for the duration of the COVID-19 declaration under Section 564(b)(1) of the Act, 21 U.S.C. section 360bbb-3(b)(1), unless the  authorization is terminated or revoked.  Performed at [facility], [address] [place], [place] [address]     Coagulation Studies: No results for input(s): LABPROT, INR in the last 72 hours.   Urinalysis: No results for input(s): COLORURINE, LABSPEC, PHURINE, GLUCOSEU, HGBUR, BILIRUBINUR, KETONESUR, PROTEINUR, UROBILINOGEN, NITRITE, LEUKOCYTESUR in the last 72 hours.  Invalid input(s): APPERANCEUR    Imaging: No results found.     Medications:     aspirin  EC  81 mg Oral Daily   atorvastatin   40 mg Oral Daily   calcium  acetate  667 mg Oral TID WC   Chlorhexidine  Gluconate Cloth  6 each Topical Q0600   clopidogrel   75 mg Oral Daily   feeding supplement (NEPRO CARB STEADY)  237 mL Oral BID BM   heparin  injection (subcutaneous)  5,000 Units Subcutaneous Q8H   midodrine   5 mg Oral Once per day on Monday Wednesday Friday   pantoprazole   40 mg Oral QHS   sertraline   100 mg Oral Daily   sodium chloride  flush  3 mL Intravenous Once   acetaminophen  **  OR** acetaminophen  (TYLENOL ) oral liquid 160 mg/5 mL **OR** acetaminophen , ammonium lactate , diclofenac  Sodium, ondansetron  (ZOFRAN ) IV, ondansetron , senna-docusate, traMADol   Assessment/ Plan:  Deanna [name] is a 73 y.o.  female with a past medical history of ESRD on dialysis, CVA, coronary artery disease, status post PCI, CHF with reduced ejection fraction, diabetes mellitus, and hypertension, Patient presents to the ED from her dialysis center with acute onset of slurred speech and facial droop there. She was also experiencing worsened weakness/numbness of the right upper extremity relative to her baseline. Code Stroke was called.    [facility]/MWF/Rt PC/74.0 kg   End-stage renal disease on hemodialysis.   Next treatment scheduled for Monday. Monitoring discharge plan.   Acute versus subacute ischemic CVA anterior left temporal lobe status post tenecteplase   administration.  History of recurrent CVA. Loop recorder placed on 08/08/23. Prescribed aspirin  and Plavix  outpatient. CT head-likely acute or subacute nonhemorrhagic infarct in the anterior left temporal lobe measuring up to moderate confluent subcortical white matter hypoattenuation stable.  MRI brain- Acute nonhemorrhagic infarct in the posterior left frontal lobe involving the primary motor cortex, with associated T2/FLAIR hyperintensity. Confluent periventricular and subcortical T2 hyperintensities bilaterally, increased from the prior study. Remote infarcts in the anterior right insular cortex, right frontal lobe operculum, and lateral left temporal lobe are new since the prior exam. Plavix  and aspirin  continued per neurology.    3. Anemia of chronic kidney disease Hemoglobin & Hematocrit     Component Value Date/Time   HGB 11.3 (L) 08/29/2024 0850   HCT 34.3 (L) 08/29/2024 0850    Patient receives Mircera at outpatient clinic.  Hgb stable   4. Secondary Hyperparathyroidism: with outpatient labs: PTH 652, phosphorus 5.2, corr calcium  08/06/2024.  Patient prescribed calcitriol, Cinacalcet and calcium  acetate outpatient.  Update: Continue calcium  acetate with meals.   5.  Hypertension chronic kidney disease  Patient currently not on any antihypertensive medications. Experienced symptomatic hypotension during dialysis. Given IV Albumin  for blood pressure support. Midodrine  5mg  three times daily.       LOS: 16 Deanna [name] 12/7/202511:35 AM

## 2024-08-31 NOTE — Plan of Care (Signed)
  Problem: Ischemic Stroke/TIA Tissue Perfusion: Goal: Complications of ischemic stroke/TIA will be minimized Outcome: Progressing   Problem: Self-Care: Goal: Verbalization of feelings and concerns over difficulty with self-care will improve Outcome: Progressing   Problem: Nutrition: Goal: Risk of aspiration will decrease Outcome: Progressing   Problem: Clinical Measurements: Goal: Ability to maintain clinical measurements within normal limits will improve Outcome: Progressing Goal: Respiratory complications will improve Outcome: Progressing   Problem: Education: Goal: Knowledge of General Education information will improve Description: Including pain rating scale, medication(s)/side effects and non-pharmacologic comfort measures Outcome: Not Progressing

## 2024-09-01 ENCOUNTER — Inpatient Hospital Stay: Admit: 2024-09-01 | Discharge: 2024-09-01 | Disposition: A | Attending: Internal Medicine | Admitting: Internal Medicine

## 2024-09-01 LAB — ECHOCARDIOGRAM COMPLETE BUBBLE STUDY
AR max vel: 1.89 cm2
AV Area VTI: 2.18 cm2
AV Area mean vel: 1.94 cm2
AV Mean grad: 2.5 mmHg
AV Peak grad: 4.5 mmHg
Ao pk vel: 1.07 m/s
Area-P 1/2: 6.54 cm2
MV VTI: 1.42 cm2
S' Lateral: 2.6 cm

## 2024-09-01 LAB — CBC WITH DIFFERENTIAL/PLATELET
Abs Immature Granulocytes: 0.09 K/uL — ABNORMAL HIGH (ref 0.00–0.07)
Basophils Absolute: 0.1 K/uL (ref 0.0–0.1)
Basophils Relative: 1 %
Eosinophils Absolute: 0.3 K/uL (ref 0.0–0.5)
Eosinophils Relative: 3 %
HCT: 31.5 % — ABNORMAL LOW (ref 36.0–46.0)
Hemoglobin: 10.3 g/dL — ABNORMAL LOW (ref 12.0–15.0)
Immature Granulocytes: 1 %
Lymphocytes Relative: 19 %
Lymphs Abs: 1.8 K/uL (ref 0.7–4.0)
MCH: 27.7 pg (ref 26.0–34.0)
MCHC: 32.7 g/dL (ref 30.0–36.0)
MCV: 84.7 fL (ref 80.0–100.0)
Monocytes Absolute: 0.7 K/uL (ref 0.1–1.0)
Monocytes Relative: 7 %
Neutro Abs: 6.6 K/uL (ref 1.7–7.7)
Neutrophils Relative %: 69 %
Platelets: 338 K/uL (ref 150–400)
RBC: 3.72 MIL/uL — ABNORMAL LOW (ref 3.87–5.11)
RDW: 16.5 % — ABNORMAL HIGH (ref 11.5–15.5)
WBC: 9.4 K/uL (ref 4.0–10.5)
nRBC: 0 % (ref 0.0–0.2)

## 2024-09-01 LAB — RENAL FUNCTION PANEL
Albumin: 4.2 g/dL (ref 3.5–5.0)
Anion gap: 17 — ABNORMAL HIGH (ref 5–15)
BUN: 47 mg/dL — ABNORMAL HIGH (ref 8–23)
CO2: 25 mmol/L (ref 22–32)
Calcium: 9.9 mg/dL (ref 8.9–10.3)
Chloride: 90 mmol/L — ABNORMAL LOW (ref 98–111)
Creatinine, Ser: 6.35 mg/dL — ABNORMAL HIGH (ref 0.44–1.00)
GFR, Estimated: 6 mL/min — ABNORMAL LOW (ref >60)
Glucose, Bld: 98 mg/dL (ref 70–99)
Phosphorus: 4.9 mg/dL — ABNORMAL HIGH (ref 2.5–4.6)
Potassium: 4.6 mmol/L (ref 3.5–5.1)
Sodium: 132 mmol/L — ABNORMAL LOW (ref 135–145)

## 2024-09-01 MED ORDER — HEPARIN SODIUM (PORCINE) 1000 UNIT/ML IJ SOLN
INTRAMUSCULAR | Status: AC
  Filled 2024-09-01: qty 4

## 2024-09-01 NOTE — Plan of Care (Signed)
  Problem: Education: Goal: Knowledge of patient specific risk factors will improve (DELETE if not current risk factor) Outcome: Progressing   Problem: Health Behavior/Discharge Planning: Goal: Ability to manage health-related needs will improve Outcome: Progressing   Problem: Nutrition: Goal: Risk of aspiration will decrease Outcome: Progressing   Problem: Clinical Measurements: Goal: Ability to maintain clinical measurements within normal limits will improve Outcome: Progressing

## 2024-09-01 NOTE — TOC Progression Note (Addendum)
 Transition of Care [facility]) - Progression Note    Patient Details  Name: Deanna [name] MRN: [id number] Date of Birth: Jun 30, 1951  [facility]) CM/SW Contact  Daved JONETTA Hamilton, RN Phone Number: 09/01/2024, 8:47 AM  Clinical Narrative:     As of this documentation referral with [facility] is still pending.  Spoke with [facility] regarding dialysis locations they transport to. [place] will reach out to transportation to confirm and will contact me back.  [facility] can transport to [facility] on [address] in [place], this CM provided this information to Suzen Satchel who is assisting with [facility] referral.   Once dialysis location has been determined, this CM will request re-authorization of SNF.  Expected Discharge Plan: Skilled Nursing Facility Barriers to Discharge: Insurance Authorization               Expected Discharge Plan and Services   Discharge Planning Services: CM Consult Post Acute Care Choice: Skilled Nursing Facility Living arrangements for the past 2 months: Assisted Living Facility Expected Discharge Date: 08/18/24                                     Social Drivers of Health (SDOH) Interventions SDOH Screenings   Food Insecurity: Patient Declined (08/17/2024)  Housing: Unknown (08/17/2024)  Transportation Needs: Patient Declined (08/17/2024)  Utilities: Patient Declined (08/17/2024)  Depression (PHQ2-9): Low Risk  (06/12/2024)  Financial Resource Strain: Low Risk  (07/22/2024)   Received from [facility]  Social Connections: Patient Declined (08/17/2024)  Tobacco Use: Low Risk  (08/15/2024)    Readmission Risk Interventions    01/16/2022    3:45 PM 12/29/2021    2:33 PM  Readmission Risk Prevention Plan  Transportation Screening Complete Complete  PCP or Specialist Appt within 5-7 Days  Complete  Home Care Screening  Complete  Medication Review (RN CM)  Complete  Medication Review (RN Care Manager)  Complete   PCP or Specialist appointment within 3-5 days of discharge Complete   HRI or Home Care Consult Complete   SW Recovery Care/Counseling Consult Complete   Palliative Care Screening Not Applicable   Skilled Nursing Facility Not Applicable

## 2024-09-01 NOTE — TOC Progression Note (Signed)
 Transition of Care [facility]) - Progression Note    Patient Details  Name: Deanna [name] MRN: [id number] Date of Birth: 06-Oct-1950  Transition of Care [facility]) CM/SW Contact  Daved JONETTA Hamilton, RN Phone Number: 09/01/2024, 3:49 PM  Clinical Narrative:     This CM was notified that Briana with [facility] at hospital to meet with patient and assess for potential bed offer. Briana followed up with this CM that they are unable to offer patient bed.  Awaiting referral acceptance with [facility].   Expected Discharge Plan: Skilled Nursing Facility Barriers to Discharge: Insurance Authorization               Expected Discharge Plan and Services   Discharge Planning Services: CM Consult Post Acute Care Choice: Skilled Nursing Facility Living arrangements for the past 2 months: Assisted Living Facility Expected Discharge Date: 08/18/24                                     Social Drivers of Health (SDOH) Interventions SDOH Screenings   Food Insecurity: Patient Declined (08/17/2024)  Housing: Unknown (08/17/2024)  Transportation Needs: Patient Declined (08/17/2024)  Utilities: Patient Declined (08/17/2024)  Depression (PHQ2-9): Low Risk  (06/12/2024)  Financial Resource Strain: Low Risk  (07/22/2024)   Received from [facility]  Social Connections: Patient Declined (08/17/2024)  Tobacco Use: Low Risk  (08/15/2024)    Readmission Risk Interventions    01/16/2022    3:45 PM 12/29/2021    2:33 PM  Readmission Risk Prevention Plan  Transportation Screening Complete Complete  PCP or Specialist Appt within 5-7 Days  Complete  Home Care Screening  Complete  Medication Review (RN CM)  Complete  Medication Review (RN Care Manager) Complete   PCP or Specialist appointment within 3-5 days of discharge Complete   HRI or Home Care Consult Complete   SW Recovery Care/Counseling Consult Complete   Palliative Care Screening Not Applicable   Skilled Nursing Facility Not  Applicable

## 2024-09-01 NOTE — Progress Notes (Signed)
 PT Cancellation Note  Patient Details Name: Magenta Schmiesing MRN: [id number] DOB: 07-16-1951   Cancelled Treatment:    Reason Eval/Treat Not Completed: Patient at procedure or test/unavailable Pt OTF at dialysis at this time. Will f/u as able.  Richerd Pinal, PT, DPT 09/01/24, 12:33 PM   [name] 09/01/2024, 12:33 PM

## 2024-09-01 NOTE — Plan of Care (Signed)
  Problem: Education: Goal: Knowledge of patient specific risk factors will improve (DELETE if not current risk factor) Outcome: Progressing   Problem: Ischemic Stroke/TIA Tissue Perfusion: Goal: Complications of ischemic stroke/TIA will be minimized Outcome: Progressing   Problem: Coping: Goal: Will identify appropriate support needs Outcome: Progressing   Problem: Health Behavior/Discharge Planning: Goal: Ability to manage health-related needs will improve 09/01/2024 0513 by Roark Selinda CROME, RN Outcome: Progressing 09/01/2024 0512 by Roark Selinda CROME, RN Outcome: Progressing   Problem: Self-Care: Goal: Ability to participate in self-care as condition permits will improve Outcome: Progressing   Problem: Nutrition: Goal: Risk of aspiration will decrease 09/01/2024 0513 by Roark Selinda CROME, RN Outcome: Progressing 09/01/2024 0512 by Roark Selinda CROME, RN Outcome: Progressing Goal: Dietary intake will improve Outcome: Progressing   Problem: Clinical Measurements: Goal: Ability to maintain clinical measurements within normal limits will improve Outcome: Progressing

## 2024-09-01 NOTE — Progress Notes (Signed)
*  PRELIMINARY RESULTS* Echocardiogram 2D Echocardiogram has been performed.  Floydene Harder 09/01/2024, 8:00 AM

## 2024-09-01 NOTE — Progress Notes (Signed)
 PROGRESS NOTE    Deanna [name]  FMW:[id number] DOB: 1951/07/13 DOA: 08/15/2024 PCP: Brutus Delon SAUNDERS, NP  Chief Complaint  Patient presents with   Code Stroke    Hospital Course:  Deanna [name] is a 73 y.o. female with medical history significant for CVA, ESRD on HD, extensive PAD s/p angioplasty and stent, history of HFrEF with recovered EF, hypertension, hyperlipidemia, type 2 diabetes, CAD, DISH arthritis, C3 anterior osteophyte fracture, was brought into the hospital from outpatient dialysis center due to right facial droop and slurred speech. She also had numbness and weakness in the right upper extremity. Patient was seen by neurology and was given TNK and was initially admitted to the ICU. At this time, patient has been stable and awaiting for skilled nursing facility.   Subjective: No acute events overnight.  Patient has no complaints this morning.  She is pending SNF placement   Objective: Vitals:   09/01/24 1130 09/01/24 1200 09/01/24 1230 09/01/24 1238  BP: (!) 103/55 (!) 104/55 (!) 92/50 (!) 111/50  Pulse: (!) 104 99 98 97  Resp: 16 11 17 13   Temp:    97.8 F (36.6 C)  TempSrc:    Oral  SpO2: 100% 99% 99% 96%  Weight:      Height:        Intake/Output Summary (Last 24 hours) at 09/01/2024 1525 Last data filed at 09/01/2024 1238 Gross per 24 hour  Intake --  Output 300 ml  Net -300 ml   Filed Weights   08/29/24 0838 08/29/24 1238 09/01/24 0837  Weight: 70.4 kg 71.6 kg 75.2 kg    Examination: General exam: Appears calm and comfortable, NAD  Respiratory system: No work of breathing, symmetric chest wall expansion Cardiovascular system: S1 & S2 heard, RRR.  Gastrointestinal system: Abdomen is nondistended, soft and nontender.  Neuro: Alert and oriented Psychiatry: Demonstrates appropriate judgement and insight. Mood & affect appropriate for situation.   Assessment & Plan:  Principal Problem:   Stroke (cerebrum) (HCC)    Acute left frontal lobe  nonhemorrhagic CVA - Received TNK at 11/21 at 3:15 PM - Was followed by neurology during this hospitalization - Follow-up MRI scan shows acute nonhemorrhagic infarct in posterior left frontal lobe, as well as remote infarcts in the anterior right insular cortex and right frontal lobe  - Neurology has since signed off - Continue with aspirin , Plavix , statin - PT recommending SNF - Echocardiogram: EF 55 to 60%, grade 1 diastolic dysfunction, regional wall motion cannot be evaluated.  No significant valvular abnormalities noted  ESRD on HD Hyponatremia - Nephrology consulted for ongoing HD during hospital stay  Type 2 diabetes, diet controlled - Last hemoglobin A1c 5.7% - No indication for sliding scale at this time  Hyperlipidemia - Continue statin  Hypotension - On midodrine  at home.  Currently receiving Monday Wednesday Friday with dialysis.  Continue to monitor closely.  Will make daily if needed  PAD status post stent CAD - Continue aspirin  and statin  History of CVA - Continue aspirin  and statin  Overweight BMI 29 - Outpatient follow up for lifestyle modification and risk factor management   DVT prophylaxis: Heparin    Code Status: Full Code Disposition: Needs SNF placement, TOC consulted  Consultants:    Procedures:    Antimicrobials:  Anti-infectives (From admission, onward)    None       Data Reviewed: I have personally reviewed following labs and imaging studies CBC: Recent Labs  Lab 08/27/24 0837 08/29/24 0850 09/01/24 0840  WBC 9.2 10.8* 9.4  NEUTROABS 6.4  --  6.6  HGB 11.1* 11.3* 10.3*  HCT 35.6* 34.3* 31.5*  MCV 86.0 83.3 84.7  PLT 383 366 338   Basic Metabolic Panel: Recent Labs  Lab 08/27/24 0837 08/29/24 0850 09/01/24 0840  NA 132* 129* 132*  K 4.3 4.2 4.6  CL 91* 88* 90*  CO2 23 26 25   GLUCOSE 76 106* 98  BUN 32* 41* 47*  CREATININE 6.11* 5.87* 6.35*  CALCIUM  9.4 9.7 9.9  PHOS 5.7* 4.6 4.9*   GFR: Estimated Creatinine  Clearance: 7.4 mL/min (A) (by C-G formula based on SCr of 6.35 mg/dL (H)). Liver Function Tests: Recent Labs  Lab 08/27/24 0837 08/29/24 0850 09/01/24 0840  ALBUMIN  4.0 4.2 4.2   CBG: No results for input(s): GLUCAP in the last 168 hours.  No results found for this or any previous visit (from the past 240 hours).   Radiology Studies: ECHOCARDIOGRAM COMPLETE BUBBLE STUDY Result Date: 09/01/2024    ECHOCARDIOGRAM REPORT   Patient Name:   Deanna [name] Date of Exam: 09/01/2024 Medical Rec #:  [id number]        Height:       61.0 in Accession #:    [id number]       Weight:       157.8 lb Date of Birth:  [birth year]        BSA:          1.708 m Patient Age:    73 years         BP:           109/49 mmHg Patient Gender: F                HR:           86 bpm. Exam Location:  [facility] Procedure: 2D Echo, Cardiac Doppler and Color Doppler (Both Spectral and Color            Flow Doppler were utilized during procedure). Indications:     Stroke 434.91 / I63.9  History:         Patient has prior history of Echocardiogram examinations, most                  recent 08/16/2024. CHF, CAD, Stroke; Risk Factors:Diabetes.  Sonographer:     Christopher Furnace Referring Phys:  [id number] SONA PATEL Diagnosing Phys: Deatrice Cage MD  Sonographer Comments: Technically challenging study due to limited acoustic windows, no parasternal window and no subcostal window. Pt lying in fetal position on right side. IMPRESSIONS  1. Left ventricular ejection fraction, by estimation, is 55 to 60%. The left ventricle has normal function. Left ventricular endocardial border not optimally defined to evaluate regional wall motion. Left ventricular diastolic parameters are consistent with Grade I diastolic dysfunction (impaired relaxation).  2. Right ventricular systolic function is normal. The right ventricular size is normal. Tricuspid regurgitation signal is inadequate for assessing PA pressure.  3. The mitral valve is normal in structure. No evidence  of mitral valve regurgitation. No evidence of mitral stenosis.  4. The aortic valve is normal in structure. Aortic valve regurgitation is not visualized. No aortic stenosis is present. FINDINGS  Left Ventricle: Left ventricular ejection fraction, by estimation, is 55 to 60%. The left ventricle has normal function. Left ventricular endocardial border not optimally defined to evaluate regional wall motion. The left ventricular internal cavity size was normal in size. There is no left ventricular hypertrophy. Left ventricular diastolic parameters are  consistent with Grade I diastolic dysfunction (impaired relaxation). Right Ventricle: The right ventricular size is normal. No increase in right ventricular wall thickness. Right ventricular systolic function is normal. Tricuspid regurgitation signal is inadequate for assessing PA pressure. Left Atrium: Left atrial size was normal in size. Right Atrium: Right atrial size was normal in size. Pericardium: There is no evidence of pericardial effusion. Mitral Valve: The mitral valve is normal in structure. Mild mitral annular calcification. No evidence of mitral valve regurgitation. No evidence of mitral valve stenosis. MV peak gradient, 11.0 mmHg. The mean mitral valve gradient is 6.0 mmHg. Tricuspid Valve: The tricuspid valve is normal in structure. Tricuspid valve regurgitation is not demonstrated. No evidence of tricuspid stenosis. Aortic Valve: The aortic valve is normal in structure. Aortic valve regurgitation is not visualized. No aortic stenosis is present. Aortic valve mean gradient measures 2.5 mmHg. Aortic valve peak gradient measures 4.5 mmHg. Aortic valve area, by VTI measures 2.18 cm. Pulmonic Valve: The pulmonic valve was normal in structure. Pulmonic valve regurgitation is not visualized. No evidence of pulmonic stenosis. Aorta: The aortic root is normal in size and structure. Venous: The inferior vena cava was not well visualized. IAS/Shunts: No atrial level  shunt detected by color flow Doppler.  LEFT VENTRICLE PLAX 2D LVIDd:         3.80 cm   Diastology LVIDs:         2.60 cm   LV e' medial:    6.31 cm/s LV PW:         1.00 cm   LV E/e' medial:  12.1 LV IVS:        0.90 cm   LV e' lateral:   6.53 cm/s LVOT diam:     2.00 cm   LV E/e' lateral: 11.7 LV SV:         38 LV SV Index:   22 LVOT Area:     3.14 cm  RIGHT VENTRICLE RV Basal diam:  3.60 cm RV Mid diam:    2.90 cm LEFT ATRIUM             Index        RIGHT ATRIUM           Index LA diam:        2.70 cm 1.58 cm/m   RA Area:     15.30 cm LA Vol (A2C):   45.5 ml 26.64 ml/m  RA Volume:   41.00 ml  24.00 ml/m LA Vol (A4C):   34.8 ml 20.37 ml/m LA Biplane Vol: 43.1 ml 25.23 ml/m  AORTIC VALVE AV Area (Vmax):    1.89 cm AV Area (Vmean):   1.94 cm AV Area (VTI):     2.18 cm AV Vmax:           106.50 cm/s AV Vmean:          71.250 cm/s AV VTI:            0.173 m AV Peak Grad:      4.5 mmHg AV Mean Grad:      2.5 mmHg LVOT Vmax:         64.00 cm/s LVOT Vmean:        44.100 cm/s LVOT VTI:          0.120 m LVOT/AV VTI ratio: 0.69 MITRAL VALVE                TRICUSPID VALVE MV Area (PHT): 6.54 cm     TR  Peak grad:   6.8 mmHg MV Area VTI:   1.42 cm     TR Vmax:        130.00 cm/s MV Peak grad:  11.0 mmHg MV Mean grad:  6.0 mmHg     SHUNTS MV Vmax:       1.66 m/s     Systemic VTI:  0.12 m MV Vmean:      111.0 cm/s   Systemic Diam: 2.00 cm MV Decel Time: 116 msec MV E velocity: 76.30 cm/s MV A velocity: 121.00 cm/s MV E/A ratio:  0.63 Deatrice Cage MD Electronically signed by Deatrice Cage MD Signature Date/Time: 09/01/2024/8:18:51 AM    Final     Scheduled Meds:  aspirin  EC  81 mg Oral Daily   atorvastatin   40 mg Oral Daily   calcium  acetate  667 mg Oral TID WC   Chlorhexidine  Gluconate Cloth  6 each Topical Q0600   clopidogrel   75 mg Oral Daily   feeding supplement (NEPRO CARB STEADY)  237 mL Oral BID BM   heparin  injection (subcutaneous)  5,000 Units Subcutaneous Q8H   midodrine   5 mg Oral Once per day  on Monday Wednesday Friday   pantoprazole   40 mg Oral QHS   sertraline   100 mg Oral Daily   sodium chloride  flush  3 mL Intravenous Once   Continuous Infusions:   LOS: 17 days  MDM: Patient is high risk for one or more organ failure.  They necessitate ongoing hospitalization for continued IV therapies and subsequent lab monitoring. Total time spent interpreting labs and vitals, reviewing the medical record, coordinating care amongst consultants and care team members, directly assessing and discussing care with the patient and/or family: 55 min  Maven Varelas, DO [facility]  To contact the attending physician between 7A-7P please use Epic Chat. To contact the covering physician during after hours 7P-7A, please review Amion.  09/01/2024, 3:25 PM   *This document has been created with the assistance of dictation software. Please excuse typographical errors. *

## 2024-09-01 NOTE — Progress Notes (Signed)
 [facility]  ROUNDING NOTE   Subjective:   Patient is known to our practice and receives outpatient dialysis treatments at [facility] on a MWF schedule, supervised by Dr. Marcelino.   Update:   Patient seen and evaluated during dialysis   HEMODIALYSIS FLOWSHEET:  Blood Flow Rate (mL/min): 400 mL/min Arterial Pressure (mmHg): -184.64 mmHg Venous Pressure (mmHg): 182.41 mmHg TMP (mmHg): 2.83 mmHg Ultrafiltration Rate (mL/min): 400 mL/min Dialysate Flow Rate (mL/min): 300 ml/min Dialysis Fluid Bolus: Normal Saline Bolus Amount (mL): 100 mL  Resting quietly during treatment   Objective:  Vital signs in last 24 hours:  Temp:  [97.3 F (36.3 C)-98.1 F (36.7 C)] 98.1 F (36.7 C) (12/08 0837) Pulse Rate:  [86-105] 99 (12/08 1030) Resp:  [13-34] 14 (12/08 1030) BP: (90-134)/(49-60) 102/57 (12/08 1030) SpO2:  [98 %-100 %] 100 % (12/08 1030) Weight:  [75.2 kg] 75.2 kg (12/08 0837)  Weight change:  Filed Weights   08/29/24 0838 08/29/24 1238 09/01/24 0837  Weight: 70.4 kg 71.6 kg 75.2 kg    Intake/Output: I/O last 3 completed shifts: In: 240 [P.O.:240] Out: -    Intake/Output this shift:  No intake/output data recorded.  Physical Exam: General: NAD  Head: Normocephalic  Lungs:  Clear, room air  Heart: Regular   Abdomen:  Soft  Extremities: No peripheral edema. Rt arm weakness  Neurologic: Alert, awake, conversant  Skin: dry  Access: Rt chest Permcath    Basic Metabolic Panel: Recent Labs  Lab 08/27/24 0837 08/29/24 0850  NA 132* 129*  K 4.3 4.2  CL 91* 88*  CO2 23 26  GLUCOSE 76 106*  BUN 32* 41*  CREATININE 6.11* 5.87*  CALCIUM  9.4 9.7  PHOS 5.7* 4.6    Liver Function Tests: Recent Labs  Lab 08/27/24 0837 08/29/24 0850  ALBUMIN  4.0 4.2   No results for input(s): LIPASE, AMYLASE in the last 168 hours. No results for input(s): AMMONIA in the last 168 hours.  CBC: Recent Labs  Lab 08/25/24 1115 08/27/24 0837  08/29/24 0850 09/01/24 0840  WBC 7.1 9.2 10.8* 9.4  NEUTROABS 4.8 6.4  --  6.6  HGB 11.3* 11.1* 11.3* 10.3*  HCT 35.3* 35.6* 34.3* 31.5*  MCV 84.2 86.0 83.3 84.7  PLT 350 383 366 338    Cardiac Enzymes: No results for input(s): CKTOTAL, CKMB, CKMBINDEX, TROPONINI in the last 168 hours.  BNP: Invalid input(s): POCBNP  CBG: No results for input(s): GLUCAP in the last 168 hours.   Microbiology: Results for orders placed or performed during the hospital encounter of 01/20/22  Blood culture (routine x 2)     Status: None   Collection Time: 01/20/22  7:22 PM   Specimen: BLOOD  Result Value Ref Range Status   Specimen Description BLOOD BLOOD RIGHT HAND  Final   Special Requests   Final    BOTTLES DRAWN AEROBIC AND ANAEROBIC Blood Culture results may not be optimal due to an inadequate volume of blood received in culture bottles   Culture   Final    NO GROWTH 5 DAYS Performed at [facility], [address] [place], [place] [address]    Report Status 01/25/2022 FINAL  Final  Blood culture (routine x 2)     Status: None   Collection Time: 01/20/22  7:22 PM   Specimen: BLOOD  Result Value Ref Range Status   Specimen Description BLOOD BLOOD LEFT HAND  Final   Special Requests   Final    BOTTLES DRAWN AEROBIC  AND ANAEROBIC Blood Culture results may not be optimal due to an inadequate volume of blood received in culture bottles   Culture   Final    NO GROWTH 5 DAYS Performed at [facility], [address] [place], [place] [address]    Report Status 01/25/2022 FINAL  Final  Resp Panel by RT-PCR (Flu A&B, Covid) Nasopharyngeal Swab     Status: None   Collection Time: 01/20/22  7:28 PM   Specimen: Nasopharyngeal Swab; Nasopharyngeal(NP) swabs in vial transport medium  Result Value Ref Range Status   SARS Coronavirus 2 by RT PCR NEGATIVE NEGATIVE Final    Comment: (NOTE) SARS-CoV-2 target nucleic acids are NOT DETECTED.  The SARS-CoV-2 RNA is  generally detectable in upper respiratory specimens during the acute phase of infection. The lowest concentration of SARS-CoV-2 viral copies this assay can detect is 138 copies/mL. A negative result does not preclude SARS-Cov-2 infection and should not be used as the sole basis for treatment or other patient management decisions. A negative result may occur with  improper specimen collection/handling, submission of specimen other than nasopharyngeal swab, presence of viral mutation(s) within the areas targeted by this assay, and inadequate number of viral copies(<138 copies/mL). A negative result must be combined with clinical observations, patient history, and epidemiological information. The expected result is Negative.  Fact Sheet for Patients:  bloggercourse.com  Fact Sheet for Healthcare Providers:  seriousbroker.it  This test is no t yet approved or cleared by the [place]  FDA and  has been authorized for detection and/or diagnosis of SARS-CoV-2 by FDA under an Emergency Use Authorization (EUA). This EUA will remain  in effect (meaning this test can be used) for the duration of the COVID-19 declaration under Section 564(b)(1) of the Act, 21 U.S.C.section 360bbb-3(b)(1), unless the authorization is terminated  or revoked sooner.       Influenza A by PCR NEGATIVE NEGATIVE Final   Influenza B by PCR NEGATIVE NEGATIVE Final    Comment: (NOTE) The Xpert Xpress SARS-CoV-2/FLU/RSV plus assay is intended as an aid in the diagnosis of influenza from Nasopharyngeal swab specimens and should not be used as a sole basis for treatment. Nasal washings and aspirates are unacceptable for Xpert Xpress SARS-CoV-2/FLU/RSV testing.  Fact Sheet for Patients: bloggercourse.com  Fact Sheet for Healthcare Providers: seriousbroker.it  This test is not yet approved or cleared by the [place]  [place] FDA and has been authorized for detection and/or diagnosis of SARS-CoV-2 by FDA under an Emergency Use Authorization (EUA). This EUA will remain in effect (meaning this test can be used) for the duration of the COVID-19 declaration under Section 564(b)(1) of the Act, 21 U.S.C. section 360bbb-3(b)(1), unless the authorization is terminated or revoked.  Performed at [facility], [address] [place], [place] [address]     Coagulation Studies: No results for input(s): LABPROT, INR in the last 72 hours.   Urinalysis: No results for input(s): COLORURINE, LABSPEC, PHURINE, GLUCOSEU, HGBUR, BILIRUBINUR, KETONESUR, PROTEINUR, UROBILINOGEN, NITRITE, LEUKOCYTESUR in the last 72 hours.  Invalid input(s): APPERANCEUR    Imaging: ECHOCARDIOGRAM COMPLETE BUBBLE STUDY Result Date: 09/01/2024    ECHOCARDIOGRAM REPORT   Patient Name:   ODENA MCQUAID Date of Exam: 09/01/2024 Medical Rec #:  [id number]        Height:       61.0 in Accession #:    [id number]       Weight:       157.8 lb Date of Birth:  1951-01-05  BSA:          1.708 m Patient Age:    73 years         BP:           109/49 mmHg Patient Gender: F                HR:           86 bpm. Exam Location:  [facility] Procedure: 2D Echo, Cardiac Doppler and Color Doppler (Both Spectral and Color            Flow Doppler were utilized during procedure). Indications:     Stroke 434.91 / I63.9  History:         Patient has prior history of Echocardiogram examinations, most                  recent 08/16/2024. CHF, CAD, Stroke; Risk Factors:Diabetes.  Sonographer:     Christopher Furnace Referring Phys:  [id number] SONA PATEL Diagnosing Phys: Deatrice Cage MD  Sonographer Comments: Technically challenging study due to limited acoustic windows, no parasternal window and no subcostal window. Pt lying in fetal position on right side. IMPRESSIONS  1. Left ventricular ejection fraction, by estimation, is 55 to 60%. The left  ventricle has normal function. Left ventricular endocardial border not optimally defined to evaluate regional wall motion. Left ventricular diastolic parameters are consistent with Grade I diastolic dysfunction (impaired relaxation).  2. Right ventricular systolic function is normal. The right ventricular size is normal. Tricuspid regurgitation signal is inadequate for assessing PA pressure.  3. The mitral valve is normal in structure. No evidence of mitral valve regurgitation. No evidence of mitral stenosis.  4. The aortic valve is normal in structure. Aortic valve regurgitation is not visualized. No aortic stenosis is present. FINDINGS  Left Ventricle: Left ventricular ejection fraction, by estimation, is 55 to 60%. The left ventricle has normal function. Left ventricular endocardial border not optimally defined to evaluate regional wall motion. The left ventricular internal cavity size was normal in size. There is no left ventricular hypertrophy. Left ventricular diastolic parameters are consistent with Grade I diastolic dysfunction (impaired relaxation). Right Ventricle: The right ventricular size is normal. No increase in right ventricular wall thickness. Right ventricular systolic function is normal. Tricuspid regurgitation signal is inadequate for assessing PA pressure. Left Atrium: Left atrial size was normal in size. Right Atrium: Right atrial size was normal in size. Pericardium: There is no evidence of pericardial effusion. Mitral Valve: The mitral valve is normal in structure. Mild mitral annular calcification. No evidence of mitral valve regurgitation. No evidence of mitral valve stenosis. MV peak gradient, 11.0 mmHg. The mean mitral valve gradient is 6.0 mmHg. Tricuspid Valve: The tricuspid valve is normal in structure. Tricuspid valve regurgitation is not demonstrated. No evidence of tricuspid stenosis. Aortic Valve: The aortic valve is normal in structure. Aortic valve regurgitation is not  visualized. No aortic stenosis is present. Aortic valve mean gradient measures 2.5 mmHg. Aortic valve peak gradient measures 4.5 mmHg. Aortic valve area, by VTI measures 2.18 cm. Pulmonic Valve: The pulmonic valve was normal in structure. Pulmonic valve regurgitation is not visualized. No evidence of pulmonic stenosis. Aorta: The aortic root is normal in size and structure. Venous: The inferior vena cava was not well visualized. IAS/Shunts: No atrial level shunt detected by color flow Doppler.  LEFT VENTRICLE PLAX 2D LVIDd:         3.80 cm   Diastology LVIDs:  2.60 cm   LV e' medial:    6.31 cm/s LV PW:         1.00 cm   LV E/e' medial:  12.1 LV IVS:        0.90 cm   LV e' lateral:   6.53 cm/s LVOT diam:     2.00 cm   LV E/e' lateral: 11.7 LV SV:         38 LV SV Index:   22 LVOT Area:     3.14 cm  RIGHT VENTRICLE RV Basal diam:  3.60 cm RV Mid diam:    2.90 cm LEFT ATRIUM             Index        RIGHT ATRIUM           Index LA diam:        2.70 cm 1.58 cm/m   RA Area:     15.30 cm LA Vol (A2C):   45.5 ml 26.64 ml/m  RA Volume:   41.00 ml  24.00 ml/m LA Vol (A4C):   34.8 ml 20.37 ml/m LA Biplane Vol: 43.1 ml 25.23 ml/m  AORTIC VALVE AV Area (Vmax):    1.89 cm AV Area (Vmean):   1.94 cm AV Area (VTI):     2.18 cm AV Vmax:           106.50 cm/s AV Vmean:          71.250 cm/s AV VTI:            0.173 m AV Peak Grad:      4.5 mmHg AV Mean Grad:      2.5 mmHg LVOT Vmax:         64.00 cm/s LVOT Vmean:        44.100 cm/s LVOT VTI:          0.120 m LVOT/AV VTI ratio: 0.69 MITRAL VALVE                TRICUSPID VALVE MV Area (PHT): 6.54 cm     TR Peak grad:   6.8 mmHg MV Area VTI:   1.42 cm     TR Vmax:        130.00 cm/s MV Peak grad:  11.0 mmHg MV Mean grad:  6.0 mmHg     SHUNTS MV Vmax:       1.66 m/s     Systemic VTI:  0.12 m MV Vmean:      111.0 cm/s   Systemic Diam: 2.00 cm MV Decel Time: 116 msec MV E velocity: 76.30 cm/s MV A velocity: 121.00 cm/s MV E/A ratio:  0.63 Deatrice Cage MD  Electronically signed by Deatrice Cage MD Signature Date/Time: 09/01/2024/8:18:51 AM    Final        Medications:     aspirin  EC  81 mg Oral Daily   atorvastatin   40 mg Oral Daily   calcium  acetate  667 mg Oral TID WC   Chlorhexidine  Gluconate Cloth  6 each Topical Q0600   clopidogrel   75 mg Oral Daily   feeding supplement (NEPRO CARB STEADY)  237 mL Oral BID BM   heparin  injection (subcutaneous)  5,000 Units Subcutaneous Q8H   midodrine   5 mg Oral Once per day on Monday Wednesday Friday   pantoprazole   40 mg Oral QHS   sertraline   100 mg Oral Daily   sodium chloride  flush  3 mL Intravenous Once   acetaminophen  **OR** acetaminophen  (TYLENOL ) oral  liquid 160 mg/5 mL **OR** acetaminophen , ammonium lactate , diclofenac  Sodium, ondansetron  (ZOFRAN ) IV, ondansetron , senna-docusate, traMADol   Assessment/ Plan:  Ms. Ariyel Jeangilles is a 73 y.o.  female with a past medical history of ESRD on dialysis, CVA, coronary artery disease, status post PCI, CHF with reduced ejection fraction, diabetes mellitus, and hypertension, Patient presents to the ED from her dialysis center with acute onset of slurred speech and facial droop there. She was also experiencing worsened weakness/numbness of the right upper extremity relative to her baseline. Code Stroke was called.    [facility]/MWF/Rt PC/74.0 kg   End-stage renal disease on hemodialysis.   Receiving dialysis today, low UF due to hypotension. Next treatment scheduled for Wednesday.  Monitoring discharge plan.   Acute versus subacute ischemic CVA anterior left temporal lobe status post tenecteplase  administration.  History of recurrent CVA. Loop recorder placed on 08/08/23. Prescribed aspirin  and Plavix  outpatient. CT head-likely acute or subacute nonhemorrhagic infarct in the anterior left temporal lobe measuring up to moderate confluent subcortical white matter hypoattenuation stable.  MRI brain- Acute nonhemorrhagic infarct in the  posterior left frontal lobe involving the primary motor cortex, with associated T2/FLAIR hyperintensity. Confluent periventricular and subcortical T2 hyperintensities bilaterally, increased from the prior study. Remote infarcts in the anterior right insular cortex, right frontal lobe operculum, and lateral left temporal lobe are new since the prior exam. Plavix  and aspirin  continued per neurology.    3. Anemia of chronic kidney disease Hemoglobin & Hematocrit     Component Value Date/Time   HGB 10.3 (L) 09/01/2024 0840   HCT 31.5 (L) 09/01/2024 0840    Patient receives Mircera at outpatient clinic.  Hgb remains acceptable for renal patient.   4. Secondary Hyperparathyroidism: with outpatient labs: PTH 652, phosphorus 5.2, corr calcium  08/06/2024.  Patient prescribed calcitriol, Cinacalcet and calcium  acetate outpatient.   Update: Will continue to monitor bone minerals during this admission.  5.  Hypertension chronic kidney disease  Patient currently not on any antihypertensive medications. Midodrine  5mg  three times daily.  Blood pressure 110/51 during dialysis      LOS: 17 Mansoor Hillyard 12/8/202511:01 AM

## 2024-09-02 MED ORDER — LIDOCAINE 5 % EX PTCH
1.0000 | MEDICATED_PATCH | CUTANEOUS | Status: DC
  Administered 2024-09-02 – 2024-09-04 (×3): 1 via TRANSDERMAL
  Filled 2024-09-02 (×5): qty 1

## 2024-09-02 NOTE — Progress Notes (Signed)
 PT Cancellation Note  Patient Details Name: Deanna [name] MRN: [id number] DOB: 15-Apr-1951   Cancelled Treatment:     Attempted to see pt with OT this pm, pt declined. Educated on importance of progressive mobility and participation in PT/OT. Unable to redirect pt. Will re-attempt next available date/time per POC.    Darice JAYSON Bohr 09/02/2024, 3:21 PM

## 2024-09-02 NOTE — TOC Progression Note (Signed)
 Transition of Care [facility] - Progression Note    Patient Details  Name: Deanna [name] MRN: [id number] Date of Birth: [birth year]  Transition of Care [facility]) CM/SW Contact  Daved JONETTA Hamilton, RN Phone Number: 09/02/2024, 3:13 PM  Clinical Narrative:     Patient has accepted offer at [facility] in [place], prior authorization was obtained however has since expired. Barrier to discharge is [employer] transport to the [facility] dialysis location the patient currently attends. [facility] Dialysis Navigator sent a referral to [facility] on 12/03 for patient to transfer there temporarily while at SNF. Dialysis Navigator notified this morning [facility] declined the referral. Dialysis Navigator advised this CM that a new [facility] opened up in [place] and she will send them the referral.    Expected Discharge Plan: Skilled Nursing Facility Barriers to Discharge: Insurance Authorization               Expected Discharge Plan and Services   Discharge Planning Services: CM Consult Post Acute Care Choice: Skilled Nursing Facility Living arrangements for the past 2 months: Assisted Living Facility Expected Discharge Date: 08/18/24                                     Social Drivers of Health (SDOH) Interventions SDOH Screenings   Food Insecurity: Patient Declined (08/17/2024)  Housing: Unknown (08/17/2024)  Transportation Needs: Patient Declined (08/17/2024)  Utilities: Patient Declined (08/17/2024)  Depression (PHQ2-9): Low Risk  (06/12/2024)  Financial Resource Strain: Low Risk  (07/22/2024)   Received from [facility]  Social Connections: Patient Declined (08/17/2024)  Tobacco Use: Low Risk  (08/15/2024)    Readmission Risk Interventions    01/16/2022    3:45 PM 12/29/2021    2:33 PM  Readmission Risk Prevention Plan  Transportation Screening Complete Complete  PCP or Specialist Appt within 5-7 Days  Complete  Home Care Screening  Complete   Medication Review (RN CM)  Complete  Medication Review (RN Care Manager) Complete   PCP or Specialist appointment within 3-5 days of discharge Complete   HRI or Home Care Consult Complete   SW Recovery Care/Counseling Consult Complete   Palliative Care Screening Not Applicable   Skilled Nursing Facility Not Applicable

## 2024-09-02 NOTE — Plan of Care (Signed)
  Problem: Ischemic Stroke/TIA Tissue Perfusion: Goal: Complications of ischemic stroke/TIA will be minimized Outcome: Progressing   Problem: Coping: Goal: Will verbalize positive feelings about self Outcome: Progressing Goal: Will identify appropriate support needs Outcome: Progressing   Problem: Health Behavior/Discharge Planning: Goal: Ability to manage health-related needs will improve Outcome: Progressing   Problem: Self-Care: Goal: Ability to participate in self-care as condition permits will improve Outcome: Progressing   Problem: Clinical Measurements: Goal: Will remain free from infection Outcome: Progressing   Problem: Activity: Goal: Risk for activity intolerance will decrease Outcome: Progressing   Problem: Nutrition: Goal: Adequate nutrition will be maintained Outcome: Progressing   Problem: Elimination: Goal: Will not experience complications related to bowel motility Outcome: Progressing Goal: Will not experience complications related to urinary retention Outcome: Progressing   Problem: Pain Managment: Goal: General experience of comfort will improve and/or be controlled Outcome: Progressing   Problem: Health Behavior/Discharge Planning: Goal: Ability to manage health-related needs will improve Outcome: Not Progressing

## 2024-09-02 NOTE — Plan of Care (Signed)
  Problem: Education: Goal: Knowledge of patient specific risk factors will improve (DELETE if not current risk factor) Outcome: Progressing   Problem: Coping: Goal: Will identify appropriate support needs Outcome: Progressing   Problem: Self-Care: Goal: Verbalization of feelings and concerns over difficulty with self-care will improve Outcome: Progressing   Problem: Health Behavior/Discharge Planning: Goal: Ability to manage health-related needs will improve Outcome: Progressing

## 2024-09-02 NOTE — Plan of Care (Signed)
  Problem: Education: Goal: Knowledge of patient specific risk factors will improve (DELETE if not current risk factor) 09/02/2024 0603 by Roark Selinda CROME, RN Outcome: Progressing 09/02/2024 0218 by Roark Selinda CROME, RN Outcome: Progressing   Problem: Coping: Goal: Will verbalize positive feelings about self Outcome: Progressing Goal: Will identify appropriate support needs Outcome: Progressing   Problem: Health Behavior/Discharge Planning: Goal: Ability to manage health-related needs will improve Outcome: Progressing   Problem: Self-Care: Goal: Verbalization of feelings and concerns over difficulty with self-care will improve 09/02/2024 0603 by Roark Selinda CROME, RN Outcome: Progressing 09/02/2024 0218 by Roark Selinda CROME, RN Outcome: Progressing   Problem: Health Behavior/Discharge Planning: Goal: Ability to manage health-related needs will improve Outcome: Progressing

## 2024-09-02 NOTE — Progress Notes (Signed)
 OT Cancellation Note  Patient Details Name: Deanna [name] MRN: [id number] DOB: 04/14/1951   Cancelled Treatment:    Reason Eval/Treat Not Completed: Patient declined, no reason specified. Despite education in benefits of mobility/ADL participation and therapy to support recovery, pt declines today. Will re-attempt at later date/time as able.  Zola Runion R., MPH, MS, OTR/L ascom [phone] 09/02/24, 3:17 PM

## 2024-09-02 NOTE — Progress Notes (Signed)
 Pt refusing all PO meds.  She was agreeable to heparin  injection and lidocaine  patch.  Dr. Dezii made aware.

## 2024-09-02 NOTE — Progress Notes (Signed)
 [facility]  ROUNDING NOTE   Subjective:   Patient is known to our practice and receives outpatient dialysis treatments at [facility] on a MWF schedule, supervised by Dr. Marcelino.   Update:   Patient sitting up in bed Alert Denies back pain   Objective:  Vital signs in last 24 hours:  Temp:  [97.8 F (36.6 C)-97.9 F (36.6 C)] 97.9 F (36.6 C) (12/09 0830) Pulse Rate:  [79-104] 79 (12/09 0830) Resp:  [11-20] 20 (12/09 0830) BP: (92-111)/(49-55) 101/49 (12/09 0830) SpO2:  [96 %-100 %] 100 % (12/09 0830)  Weight change:  Filed Weights   08/29/24 0838 08/29/24 1238 09/01/24 0837  Weight: 70.4 kg 71.6 kg 75.2 kg    Intake/Output: I/O last 3 completed shifts: In: -  Out: 300 [Other:300]   Intake/Output this shift:  Total I/O In: 240 [P.O.:240] Out: -   Physical Exam: General: NAD  Head: Normocephalic  Lungs:  Clear, room air  Heart: Regular   Abdomen:  Soft  Extremities: No peripheral edema. Rt arm weakness  Neurologic: Alert, awake, conversant  Skin: dry  Access: Rt chest Permcath    Basic Metabolic Panel: Recent Labs  Lab 08/27/24 0837 08/29/24 0850 09/01/24 0840  NA 132* 129* 132*  K 4.3 4.2 4.6  CL 91* 88* 90*  CO2 23 26 25   GLUCOSE 76 106* 98  BUN 32* 41* 47*  CREATININE 6.11* 5.87* 6.35*  CALCIUM  9.4 9.7 9.9  PHOS 5.7* 4.6 4.9*    Liver Function Tests: Recent Labs  Lab 08/27/24 0837 08/29/24 0850 09/01/24 0840  ALBUMIN  4.0 4.2 4.2   No results for input(s): LIPASE, AMYLASE in the last 168 hours. No results for input(s): AMMONIA in the last 168 hours.  CBC: Recent Labs  Lab 08/27/24 0837 08/29/24 0850 09/01/24 0840  WBC 9.2 10.8* 9.4  NEUTROABS 6.4  --  6.6  HGB 11.1* 11.3* 10.3*  HCT 35.6* 34.3* 31.5*  MCV 86.0 83.3 84.7  PLT 383 366 338    Cardiac Enzymes: No results for input(s): CKTOTAL, CKMB, CKMBINDEX, TROPONINI in the last 168 hours.  BNP: Invalid input(s): POCBNP  CBG: No  results for input(s): GLUCAP in the last 168 hours.   Microbiology: Results for orders placed or performed during the hospital encounter of 01/20/22  Blood culture (routine x 2)     Status: None   Collection Time: 01/20/22  7:22 PM   Specimen: BLOOD  Result Value Ref Range Status   Specimen Description BLOOD BLOOD RIGHT HAND  Final   Special Requests   Final    BOTTLES DRAWN AEROBIC AND ANAEROBIC Blood Culture results may not be optimal due to an inadequate volume of blood received in culture bottles   Culture   Final    NO GROWTH 5 DAYS Performed at [facility], [address] [place], [place] [address]    Report Status 01/25/2022 FINAL  Final  Blood culture (routine x 2)     Status: None   Collection Time: 01/20/22  7:22 PM   Specimen: BLOOD  Result Value Ref Range Status   Specimen Description BLOOD BLOOD LEFT HAND  Final   Special Requests   Final    BOTTLES DRAWN AEROBIC AND ANAEROBIC Blood Culture results may not be optimal due to an inadequate volume of blood received in culture bottles   Culture   Final    NO GROWTH 5 DAYS Performed at [facility], [address], [place], [place] [address]  Report Status 01/25/2022 FINAL  Final  Resp Panel by RT-PCR (Flu A&B, Covid) Nasopharyngeal Swab     Status: None   Collection Time: 01/20/22  7:28 PM   Specimen: Nasopharyngeal Swab; Nasopharyngeal(NP) swabs in vial transport medium  Result Value Ref Range Status   SARS Coronavirus 2 by RT PCR NEGATIVE NEGATIVE Final    Comment: (NOTE) SARS-CoV-2 target nucleic acids are NOT DETECTED.  The SARS-CoV-2 RNA is generally detectable in upper respiratory specimens during the acute phase of infection. The lowest concentration of SARS-CoV-2 viral copies this assay can detect is 138 copies/mL. A negative result does not preclude SARS-Cov-2 infection and should not be used as the sole basis for treatment or other patient management decisions. A negative result  may occur with  improper specimen collection/handling, submission of specimen other than nasopharyngeal swab, presence of viral mutation(s) within the areas targeted by this assay, and inadequate number of viral copies(<138 copies/mL). A negative result must be combined with clinical observations, patient history, and epidemiological information. The expected result is Negative.  Fact Sheet for Patients:  bloggercourse.com  Fact Sheet for Healthcare Providers:  seriousbroker.it  This test is no t yet approved or cleared by the [place]  FDA and  has been authorized for detection and/or diagnosis of SARS-CoV-2 by FDA under an Emergency Use Authorization (EUA). This EUA will remain  in effect (meaning this test can be used) for the duration of the COVID-19 declaration under Section 564(b)(1) of the Act, 21 U.S.C.section 360bbb-3(b)(1), unless the authorization is terminated  or revoked sooner.       Influenza A by PCR NEGATIVE NEGATIVE Final   Influenza B by PCR NEGATIVE NEGATIVE Final    Comment: (NOTE) The Xpert Xpress SARS-CoV-2/FLU/RSV plus assay is intended as an aid in the diagnosis of influenza from Nasopharyngeal swab specimens and should not be used as a sole basis for treatment. Nasal washings and aspirates are unacceptable for Xpert Xpress SARS-CoV-2/FLU/RSV testing.  Fact Sheet for Patients: bloggercourse.com  Fact Sheet for Healthcare Providers: seriousbroker.it  This test is not yet approved or cleared by the [place]  FDA and has been authorized for detection and/or diagnosis of SARS-CoV-2 by FDA under an Emergency Use Authorization (EUA). This EUA will remain in effect (meaning this test can be used) for the duration of the COVID-19 declaration under Section 564(b)(1) of the Act, 21 U.S.C. section 360bbb-3(b)(1), unless the authorization is terminated  or revoked.  Performed at [facility], [address] [place], [place] [address]     Coagulation Studies: No results for input(s): LABPROT, INR in the last 72 hours.   Urinalysis: No results for input(s): COLORURINE, LABSPEC, PHURINE, GLUCOSEU, HGBUR, BILIRUBINUR, KETONESUR, PROTEINUR, UROBILINOGEN, NITRITE, LEUKOCYTESUR in the last 72 hours.  Invalid input(s): APPERANCEUR    Imaging: ECHOCARDIOGRAM COMPLETE BUBBLE STUDY Result Date: 09/01/2024    ECHOCARDIOGRAM REPORT   Patient Name:   PREESHA BENJAMIN Date of Exam: 09/01/2024 Medical Rec #:  [id number]        Height:       61.0 in Accession #:    [id number]       Weight:       157.8 lb Date of Birth:  29-Mar-1951        BSA:          1.708 m Patient Age:    73 years         BP:           109/49 mmHg Patient Gender: F  HR:           86 bpm. Exam Location:  [facility] Procedure: 2D Echo, Cardiac Doppler and Color Doppler (Both Spectral and Color            Flow Doppler were utilized during procedure). Indications:     Stroke 434.91 / I63.9  History:         Patient has prior history of Echocardiogram examinations, most                  recent 08/16/2024. CHF, CAD, Stroke; Risk Factors:Diabetes.  Sonographer:     Christopher Furnace Referring Phys:  [id number] SONA PATEL Diagnosing Phys: Deatrice Cage MD  Sonographer Comments: Technically challenging study due to limited acoustic windows, no parasternal window and no subcostal window. Pt lying in fetal position on right side. IMPRESSIONS  1. Left ventricular ejection fraction, by estimation, is 55 to 60%. The left ventricle has normal function. Left ventricular endocardial border not optimally defined to evaluate regional wall motion. Left ventricular diastolic parameters are consistent with Grade I diastolic dysfunction (impaired relaxation).  2. Right ventricular systolic function is normal. The right ventricular size is normal. Tricuspid regurgitation signal is  inadequate for assessing PA pressure.  3. The mitral valve is normal in structure. No evidence of mitral valve regurgitation. No evidence of mitral stenosis.  4. The aortic valve is normal in structure. Aortic valve regurgitation is not visualized. No aortic stenosis is present. FINDINGS  Left Ventricle: Left ventricular ejection fraction, by estimation, is 55 to 60%. The left ventricle has normal function. Left ventricular endocardial border not optimally defined to evaluate regional wall motion. The left ventricular internal cavity size was normal in size. There is no left ventricular hypertrophy. Left ventricular diastolic parameters are consistent with Grade I diastolic dysfunction (impaired relaxation). Right Ventricle: The right ventricular size is normal. No increase in right ventricular wall thickness. Right ventricular systolic function is normal. Tricuspid regurgitation signal is inadequate for assessing PA pressure. Left Atrium: Left atrial size was normal in size. Right Atrium: Right atrial size was normal in size. Pericardium: There is no evidence of pericardial effusion. Mitral Valve: The mitral valve is normal in structure. Mild mitral annular calcification. No evidence of mitral valve regurgitation. No evidence of mitral valve stenosis. MV peak gradient, 11.0 mmHg. The mean mitral valve gradient is 6.0 mmHg. Tricuspid Valve: The tricuspid valve is normal in structure. Tricuspid valve regurgitation is not demonstrated. No evidence of tricuspid stenosis. Aortic Valve: The aortic valve is normal in structure. Aortic valve regurgitation is not visualized. No aortic stenosis is present. Aortic valve mean gradient measures 2.5 mmHg. Aortic valve peak gradient measures 4.5 mmHg. Aortic valve area, by VTI measures 2.18 cm. Pulmonic Valve: The pulmonic valve was normal in structure. Pulmonic valve regurgitation is not visualized. No evidence of pulmonic stenosis. Aorta: The aortic root is normal in size and  structure. Venous: The inferior vena cava was not well visualized. IAS/Shunts: No atrial level shunt detected by color flow Doppler.  LEFT VENTRICLE PLAX 2D LVIDd:         3.80 cm   Diastology LVIDs:         2.60 cm   LV e' medial:    6.31 cm/s LV PW:         1.00 cm   LV E/e' medial:  12.1 LV IVS:        0.90 cm   LV e' lateral:   6.53 cm/s LVOT diam:  2.00 cm   LV E/e' lateral: 11.7 LV SV:         38 LV SV Index:   22 LVOT Area:     3.14 cm  RIGHT VENTRICLE RV Basal diam:  3.60 cm RV Mid diam:    2.90 cm LEFT ATRIUM             Index        RIGHT ATRIUM           Index LA diam:        2.70 cm 1.58 cm/m   RA Area:     15.30 cm LA Vol (A2C):   45.5 ml 26.64 ml/m  RA Volume:   41.00 ml  24.00 ml/m LA Vol (A4C):   34.8 ml 20.37 ml/m LA Biplane Vol: 43.1 ml 25.23 ml/m  AORTIC VALVE AV Area (Vmax):    1.89 cm AV Area (Vmean):   1.94 cm AV Area (VTI):     2.18 cm AV Vmax:           106.50 cm/s AV Vmean:          71.250 cm/s AV VTI:            0.173 m AV Peak Grad:      4.5 mmHg AV Mean Grad:      2.5 mmHg LVOT Vmax:         64.00 cm/s LVOT Vmean:        44.100 cm/s LVOT VTI:          0.120 m LVOT/AV VTI ratio: 0.69 MITRAL VALVE                TRICUSPID VALVE MV Area (PHT): 6.54 cm     TR Peak grad:   6.8 mmHg MV Area VTI:   1.42 cm     TR Vmax:        130.00 cm/s MV Peak grad:  11.0 mmHg MV Mean grad:  6.0 mmHg     SHUNTS MV Vmax:       1.66 m/s     Systemic VTI:  0.12 m MV Vmean:      111.0 cm/s   Systemic Diam: 2.00 cm MV Decel Time: 116 msec MV E velocity: 76.30 cm/s MV A velocity: 121.00 cm/s MV E/A ratio:  0.63 Deatrice Cage MD Electronically signed by Deatrice Cage MD Signature Date/Time: 09/01/2024/8:18:51 AM    Final        Medications:     aspirin  EC  81 mg Oral Daily   atorvastatin   40 mg Oral Daily   calcium  acetate  667 mg Oral TID WC   Chlorhexidine  Gluconate Cloth  6 each Topical Q0600   clopidogrel   75 mg Oral Daily   feeding supplement (NEPRO CARB STEADY)  237 mL Oral BID BM    heparin  injection (subcutaneous)  5,000 Units Subcutaneous Q8H   lidocaine   1 patch Transdermal Q24H   midodrine   5 mg Oral Once per day on Monday Wednesday Friday   pantoprazole   40 mg Oral QHS   sertraline   100 mg Oral Daily   sodium chloride  flush  3 mL Intravenous Once   acetaminophen  **OR** acetaminophen  (TYLENOL ) oral liquid 160 mg/5 mL **OR** acetaminophen , ammonium lactate , diclofenac  Sodium, ondansetron  (ZOFRAN ) IV, ondansetron , senna-docusate, traMADol   Assessment/ Plan:  [name] is a 73 y.o.  female with a past medical history of ESRD on dialysis, CVA, coronary artery disease, status post PCI, CHF with reduced ejection  fraction, diabetes mellitus, and hypertension, Patient presents to the ED from her dialysis center with acute onset of slurred speech and facial droop there. She was also experiencing worsened weakness/numbness of the right upper extremity relative to her baseline. Code Stroke was called.    [facility]/MWF/Rt PC/74.0 kg   End-stage renal disease on hemodialysis.   Received dialysis yesterday, UF achieved. Next treatment scheduled for Wednesday  Acute versus subacute ischemic CVA anterior left temporal lobe status post tenecteplase  administration.  History of recurrent CVA. Loop recorder placed on 08/08/23. Prescribed aspirin  and Plavix  outpatient. CT head-likely acute or subacute nonhemorrhagic infarct in the anterior left temporal lobe measuring up to moderate confluent subcortical white matter hypoattenuation stable.  MRI brain- Acute nonhemorrhagic infarct in the posterior left frontal lobe involving the primary motor cortex, with associated T2/FLAIR hyperintensity. Confluent periventricular and subcortical T2 hyperintensities bilaterally, increased from the prior study. Remote infarcts in the anterior right insular cortex, right frontal lobe operculum, and lateral left temporal lobe are new since the prior exam. Plavix  and aspirin   continued per neurology.    3. Anemia of chronic kidney disease Hemoglobin & Hematocrit     Component Value Date/Time   HGB 10.3 (L) 09/01/2024 0840   HCT 31.5 (L) 09/01/2024 0840    Patient receives Mircera at outpatient clinic.  Hgb stable.    4. Secondary Hyperparathyroidism: with outpatient labs: PTH 652, phosphorus 5.2, corr calcium  08/06/2024.  Patient prescribed calcitriol, Cinacalcet and calcium  acetate outpatient.   Update: Calcium  and phos stable  5.  Hypertension chronic kidney disease  Patient currently not on any antihypertensive medications. Midodrine  5mg  three times daily.       LOS: 18 Shalawn Wynder 12/9/202511:11 AM

## 2024-09-02 NOTE — Progress Notes (Signed)
 PROGRESS NOTE    Deanna [name]  FMW:[id number] DOB: 1951/03/07 DOA: 08/15/2024 PCP: Brutus Delon SAUNDERS, NP  Chief Complaint  Patient presents with   Code Stroke    Hospital Course:  Deanna [name] is a 73 y.o. female with medical history significant for CVA, ESRD on HD, extensive PAD s/p angioplasty and stent, history of HFrEF with recovered EF, hypertension, hyperlipidemia, type 2 diabetes, CAD, DISH arthritis, C3 anterior osteophyte fracture, was brought into the hospital from outpatient dialysis center due to right facial droop and slurred speech. She also had numbness and weakness in the right upper extremity. Patient was seen by neurology and was given TNK and was initially admitted to the ICU. At this time, patient has been stable and awaiting for skilled nursing facility.   Subjective: Patient complains RN this morning the foot is hurting.  She refuses to let me evaluate it.  She reports it is better with lidocaine  patch now  Objective: Vitals:   09/01/24 1200 09/01/24 1230 09/01/24 1238 09/02/24 0830  BP: (!) 104/55 (!) 92/50 (!) 111/50 (!) 101/49  Pulse: 99 98 97 79  Resp: 11 17 13 20   Temp:   97.8 F (36.6 C) 97.9 F (36.6 C)  TempSrc:   Oral   SpO2: 99% 99% 96% 100%  Weight:      Height:        Intake/Output Summary (Last 24 hours) at 09/02/2024 1509 Last data filed at 09/02/2024 1300 Gross per 24 hour  Intake 480 ml  Output --  Net 480 ml   Filed Weights   08/29/24 0838 08/29/24 1238 09/01/24 0837  Weight: 70.4 kg 71.6 kg 75.2 kg    Examination: General exam: Appears calm and comfortable, NAD  Respiratory system: No work of breathing, symmetric chest wall expansion Cardiovascular system: S1 & S2 heard, RRR.  Gastrointestinal system: Abdomen is nondistended, soft and nontender.  Neuro: Alert and oriented Psychiatry: Demonstrates appropriate judgement and insight. Mood & affect appropriate for situation.   Assessment & Plan:  Principal Problem:    Stroke (cerebrum) (HCC)    Acute left frontal lobe nonhemorrhagic CVA - Received TNK at 11/21 at 3:15 PM - Was followed by neurology during this hospitalization - Follow-up MRI scan shows acute nonhemorrhagic infarct in posterior left frontal lobe, as well as remote infarcts in the anterior right insular cortex and right frontal lobe  - Neurology has since signed off - Continue with aspirin , Plavix , statin - PT recommending SNF - Echocardiogram: EF 55 to 60%, grade 1 diastolic dysfunction, regional wall motion cannot be evaluated.  No significant valvular abnormalities noted  ESRD on HD Hyponatremia - Nephrology consulted for ongoing HD during hospital stay  Type 2 diabetes, diet controlled - Last hemoglobin A1c 5.7% - No indication for sliding scale at this time  Hyperlipidemia - Continue statin  Hypotension - On midodrine  at home.  Currently receiving Monday Wednesday Friday with dialysis.  Continue to monitor closely.  Will make daily if needed  PAD status post stent CAD - Continue aspirin  and statin  History of CVA - Continue aspirin  and statin  Overweight BMI 29 - Outpatient follow up for lifestyle modification and risk factor management   DVT prophylaxis: Heparin    Code Status: Full Code Disposition: Needs SNF placement, TOC consulted  Consultants:    Procedures:    Antimicrobials:  Anti-infectives (From admission, onward)    None       Data Reviewed: I have personally reviewed following labs and imaging studies CBC:  Recent Labs  Lab 08/27/24 0837 08/29/24 0850 09/01/24 0840  WBC 9.2 10.8* 9.4  NEUTROABS 6.4  --  6.6  HGB 11.1* 11.3* 10.3*  HCT 35.6* 34.3* 31.5*  MCV 86.0 83.3 84.7  PLT 383 366 338   Basic Metabolic Panel: Recent Labs  Lab 08/27/24 0837 08/29/24 0850 09/01/24 0840  NA 132* 129* 132*  K 4.3 4.2 4.6  CL 91* 88* 90*  CO2 23 26 25   GLUCOSE 76 106* 98  BUN 32* 41* 47*  CREATININE 6.11* 5.87* 6.35*  CALCIUM  9.4 9.7  9.9  PHOS 5.7* 4.6 4.9*   GFR: Estimated Creatinine Clearance: 7.4 mL/min (A) (by C-G formula based on SCr of 6.35 mg/dL (H)). Liver Function Tests: Recent Labs  Lab 08/27/24 0837 08/29/24 0850 09/01/24 0840  ALBUMIN  4.0 4.2 4.2   CBG: No results for input(s): GLUCAP in the last 168 hours.  No results found for this or any previous visit (from the past 240 hours).   Radiology Studies: ECHOCARDIOGRAM COMPLETE BUBBLE STUDY Result Date: 09/01/2024    ECHOCARDIOGRAM REPORT   Patient Name:   Deanna [name] Date of Exam: 09/01/2024 Medical Rec #:  [id number]        Height:       61.0 in Accession #:    [id number]       Weight:       157.8 lb Date of Birth:  Oct 19, 1950        BSA:          1.708 m Patient Age:    73 years         BP:           109/49 mmHg Patient Gender: F                HR:           86 bpm. Exam Location:  [facility] Procedure: 2D Echo, Cardiac Doppler and Color Doppler (Both Spectral and Color            Flow Doppler were utilized during procedure). Indications:     Stroke 434.91 / I63.9  History:         Patient has prior history of Echocardiogram examinations, most                  recent 08/16/2024. CHF, CAD, Stroke; Risk Factors:Diabetes.  Sonographer:     Christopher Furnace Referring Phys:  [id number] SONA PATEL Diagnosing Phys: Deatrice Cage MD  Sonographer Comments: Technically challenging study due to limited acoustic windows, no parasternal window and no subcostal window. Pt lying in fetal position on right side. IMPRESSIONS  1. Left ventricular ejection fraction, by estimation, is 55 to 60%. The left ventricle has normal function. Left ventricular endocardial border not optimally defined to evaluate regional wall motion. Left ventricular diastolic parameters are consistent with Grade I diastolic dysfunction (impaired relaxation).  2. Right ventricular systolic function is normal. The right ventricular size is normal. Tricuspid regurgitation signal is inadequate for assessing PA pressure.   3. The mitral valve is normal in structure. No evidence of mitral valve regurgitation. No evidence of mitral stenosis.  4. The aortic valve is normal in structure. Aortic valve regurgitation is not visualized. No aortic stenosis is present. FINDINGS  Left Ventricle: Left ventricular ejection fraction, by estimation, is 55 to 60%. The left ventricle has normal function. Left ventricular endocardial border not optimally defined to evaluate regional wall motion. The left ventricular internal cavity size was normal in size.  There is no left ventricular hypertrophy. Left ventricular diastolic parameters are consistent with Grade I diastolic dysfunction (impaired relaxation). Right Ventricle: The right ventricular size is normal. No increase in right ventricular wall thickness. Right ventricular systolic function is normal. Tricuspid regurgitation signal is inadequate for assessing PA pressure. Left Atrium: Left atrial size was normal in size. Right Atrium: Right atrial size was normal in size. Pericardium: There is no evidence of pericardial effusion. Mitral Valve: The mitral valve is normal in structure. Mild mitral annular calcification. No evidence of mitral valve regurgitation. No evidence of mitral valve stenosis. MV peak gradient, 11.0 mmHg. The mean mitral valve gradient is 6.0 mmHg. Tricuspid Valve: The tricuspid valve is normal in structure. Tricuspid valve regurgitation is not demonstrated. No evidence of tricuspid stenosis. Aortic Valve: The aortic valve is normal in structure. Aortic valve regurgitation is not visualized. No aortic stenosis is present. Aortic valve mean gradient measures 2.5 mmHg. Aortic valve peak gradient measures 4.5 mmHg. Aortic valve area, by VTI measures 2.18 cm. Pulmonic Valve: The pulmonic valve was normal in structure. Pulmonic valve regurgitation is not visualized. No evidence of pulmonic stenosis. Aorta: The aortic root is normal in size and structure. Venous: The inferior vena  cava was not well visualized. IAS/Shunts: No atrial level shunt detected by color flow Doppler.  LEFT VENTRICLE PLAX 2D LVIDd:         3.80 cm   Diastology LVIDs:         2.60 cm   LV e' medial:    6.31 cm/s LV PW:         1.00 cm   LV E/e' medial:  12.1 LV IVS:        0.90 cm   LV e' lateral:   6.53 cm/s LVOT diam:     2.00 cm   LV E/e' lateral: 11.7 LV SV:         38 LV SV Index:   22 LVOT Area:     3.14 cm  RIGHT VENTRICLE RV Basal diam:  3.60 cm RV Mid diam:    2.90 cm LEFT ATRIUM             Index        RIGHT ATRIUM           Index LA diam:        2.70 cm 1.58 cm/m   RA Area:     15.30 cm LA Vol (A2C):   45.5 ml 26.64 ml/m  RA Volume:   41.00 ml  24.00 ml/m LA Vol (A4C):   34.8 ml 20.37 ml/m LA Biplane Vol: 43.1 ml 25.23 ml/m  AORTIC VALVE AV Area (Vmax):    1.89 cm AV Area (Vmean):   1.94 cm AV Area (VTI):     2.18 cm AV Vmax:           106.50 cm/s AV Vmean:          71.250 cm/s AV VTI:            0.173 m AV Peak Grad:      4.5 mmHg AV Mean Grad:      2.5 mmHg LVOT Vmax:         64.00 cm/s LVOT Vmean:        44.100 cm/s LVOT VTI:          0.120 m LVOT/AV VTI ratio: 0.69 MITRAL VALVE                TRICUSPID  VALVE MV Area (PHT): 6.54 cm     TR Peak grad:   6.8 mmHg MV Area VTI:   1.42 cm     TR Vmax:        130.00 cm/s MV Peak grad:  11.0 mmHg MV Mean grad:  6.0 mmHg     SHUNTS MV Vmax:       1.66 m/s     Systemic VTI:  0.12 m MV Vmean:      111.0 cm/s   Systemic Diam: 2.00 cm MV Decel Time: 116 msec MV E velocity: 76.30 cm/s MV A velocity: 121.00 cm/s MV E/A ratio:  0.63 Deatrice Cage MD Electronically signed by Deatrice Cage MD Signature Date/Time: 09/01/2024/8:18:51 AM    Final     Scheduled Meds:  aspirin  EC  81 mg Oral Daily   atorvastatin   40 mg Oral Daily   calcium  acetate  667 mg Oral TID WC   Chlorhexidine  Gluconate Cloth  6 each Topical Q0600   clopidogrel   75 mg Oral Daily   feeding supplement (NEPRO CARB STEADY)  237 mL Oral BID BM   heparin  injection (subcutaneous)  5,000  Units Subcutaneous Q8H   lidocaine   1 patch Transdermal Q24H   midodrine   5 mg Oral Once per day on Monday Wednesday Friday   pantoprazole   40 mg Oral QHS   sertraline   100 mg Oral Daily   sodium chloride  flush  3 mL Intravenous Once   Continuous Infusions:   LOS: 18 days  MDM: Patient is high risk for one or more organ failure.  They necessitate ongoing hospitalization for continued IV therapies and subsequent lab monitoring. Total time spent interpreting labs and vitals, reviewing the medical record, coordinating care amongst consultants and care team members, directly assessing and discussing care with the patient and/or family: 55 min  Ermie Glendenning, DO [facility]  To contact the attending physician between 7A-7P please use Epic Chat. To contact the covering physician during after hours 7P-7A, please review Amion.  09/02/2024, 3:09 PM   *This document has been created with the assistance of dictation software. Please excuse typographical errors. *

## 2024-09-03 LAB — RENAL FUNCTION PANEL
Albumin: 3.8 g/dL (ref 3.5–5.0)
Anion gap: 15 (ref 5–15)
BUN: 39 mg/dL — ABNORMAL HIGH (ref 8–23)
CO2: 28 mmol/L (ref 22–32)
Calcium: 9.5 mg/dL (ref 8.9–10.3)
Chloride: 89 mmol/L — ABNORMAL LOW (ref 98–111)
Creatinine, Ser: 5.24 mg/dL — ABNORMAL HIGH (ref 0.44–1.00)
GFR, Estimated: 8 mL/min — ABNORMAL LOW (ref >60)
Glucose, Bld: 138 mg/dL — ABNORMAL HIGH (ref 70–99)
Phosphorus: 4.1 mg/dL (ref 2.5–4.6)
Potassium: 4.2 mmol/L (ref 3.5–5.1)
Sodium: 132 mmol/L — ABNORMAL LOW (ref 135–145)

## 2024-09-03 LAB — CBC
HCT: 30.2 % — ABNORMAL LOW (ref 36.0–46.0)
Hemoglobin: 9.7 g/dL — ABNORMAL LOW (ref 12.0–15.0)
MCH: 27.2 pg (ref 26.0–34.0)
MCHC: 32.1 g/dL (ref 30.0–36.0)
MCV: 84.8 fL (ref 80.0–100.0)
Platelets: 297 K/uL (ref 150–400)
RBC: 3.56 MIL/uL — ABNORMAL LOW (ref 3.87–5.11)
RDW: 16.4 % — ABNORMAL HIGH (ref 11.5–15.5)
WBC: 9 K/uL (ref 4.0–10.5)
nRBC: 0 % (ref 0.0–0.2)

## 2024-09-03 MED ORDER — HEPARIN SODIUM (PORCINE) 1000 UNIT/ML IJ SOLN
INTRAMUSCULAR | Status: AC
  Filled 2024-09-03: qty 4

## 2024-09-03 MED ORDER — ALTEPLASE 2 MG IJ SOLR: 2.0000 mg | Freq: Once | INTRAMUSCULAR | Status: DC | PRN

## 2024-09-03 MED ORDER — HEPARIN SODIUM (PORCINE) 1000 UNIT/ML DIALYSIS: 1000.0000 [IU] | INTRAMUSCULAR | Status: DC | PRN

## 2024-09-03 NOTE — Progress Notes (Signed)
 [facility]  ROUNDING NOTE   Subjective:   Patient is known to our practice and receives outpatient dialysis treatments at [facility] on a MWF schedule, supervised by Dr. Marcelino.   Update:   Patient sitting up in bed Alert and oriented Denies pain   Objective:  Vital signs in last 24 hours:  Temp:  [97.7 F (36.5 C)-98.2 F (36.8 C)] 97.8 F (36.6 C) (12/10 0915) Pulse Rate:  [84-102] 102 (12/10 0915) Resp:  [16-18] 18 (12/10 0915) BP: (98-123)/(50-60) 117/60 (12/10 0915) SpO2:  [100 %] 100 % (12/10 0915)  Weight change:  Filed Weights   08/29/24 0838 08/29/24 1238 09/01/24 0837  Weight: 70.4 kg 71.6 kg 75.2 kg    Intake/Output: I/O last 3 completed shifts: In: 480 [P.O.:480] Out: -    Intake/Output this shift:  No intake/output data recorded.  Physical Exam: General: NAD  Head: Normocephalic  Lungs:  Clear, room air  Heart: Regular   Abdomen:  Soft  Extremities: No peripheral edema. Rt arm weakness  Neurologic: Alert, awake, conversant  Skin: dry  Access: Rt chest Permcath    Basic Metabolic Panel: Recent Labs  Lab 08/29/24 0850 09/01/24 0840  NA 129* 132*  K 4.2 4.6  CL 88* 90*  CO2 26 25  GLUCOSE 106* 98  BUN 41* 47*  CREATININE 5.87* 6.35*  CALCIUM  9.7 9.9  PHOS 4.6 4.9*    Liver Function Tests: Recent Labs  Lab 08/29/24 0850 09/01/24 0840  ALBUMIN  4.2 4.2   No results for input(s): LIPASE, AMYLASE in the last 168 hours. No results for input(s): AMMONIA in the last 168 hours.  CBC: Recent Labs  Lab 08/29/24 0850 09/01/24 0840  WBC 10.8* 9.4  NEUTROABS  --  6.6  HGB 11.3* 10.3*  HCT 34.3* 31.5*  MCV 83.3 84.7  PLT 366 338    Cardiac Enzymes: No results for input(s): CKTOTAL, CKMB, CKMBINDEX, TROPONINI in the last 168 hours.  BNP: Invalid input(s): POCBNP  CBG: No results for input(s): GLUCAP in the last 168 hours.   Microbiology: Results for orders placed or performed during the  hospital encounter of 01/20/22  Blood culture (routine x 2)     Status: None   Collection Time: 01/20/22  7:22 PM   Specimen: BLOOD  Result Value Ref Range Status   Specimen Description BLOOD BLOOD RIGHT HAND  Final   Special Requests   Final    BOTTLES DRAWN AEROBIC AND ANAEROBIC Blood Culture results may not be optimal due to an inadequate volume of blood received in culture bottles   Culture   Final    NO GROWTH 5 DAYS Performed at [facility], [address] [place], [place] [address]    Report Status 01/25/2022 FINAL  Final  Blood culture (routine x 2)     Status: None   Collection Time: 01/20/22  7:22 PM   Specimen: BLOOD  Result Value Ref Range Status   Specimen Description BLOOD BLOOD LEFT HAND  Final   Special Requests   Final    BOTTLES DRAWN AEROBIC AND ANAEROBIC Blood Culture results may not be optimal due to an inadequate volume of blood received in culture bottles   Culture   Final    NO GROWTH 5 DAYS Performed at [facility], [address] [place], [place] [address]    Report Status 01/25/2022 FINAL  Final  Resp Panel by RT-PCR (Flu A&B, Covid) Nasopharyngeal Swab     Status: None  Collection Time: 01/20/22  7:28 PM   Specimen: Nasopharyngeal Swab; Nasopharyngeal(NP) swabs in vial transport medium  Result Value Ref Range Status   SARS Coronavirus 2 by RT PCR NEGATIVE NEGATIVE Final    Comment: (NOTE) SARS-CoV-2 target nucleic acids are NOT DETECTED.  The SARS-CoV-2 RNA is generally detectable in upper respiratory specimens during the acute phase of infection. The lowest concentration of SARS-CoV-2 viral copies this assay can detect is 138 copies/mL. A negative result does not preclude SARS-Cov-2 infection and should not be used as the sole basis for treatment or other patient management decisions. A negative result may occur with  improper specimen collection/handling, submission of specimen other than nasopharyngeal swab, presence of  viral mutation(s) within the areas targeted by this assay, and inadequate number of viral copies(<138 copies/mL). A negative result must be combined with clinical observations, patient history, and epidemiological information. The expected result is Negative.  Fact Sheet for Patients:  bloggercourse.com  Fact Sheet for Healthcare Providers:  seriousbroker.it  This test is no t yet approved or cleared by the [place]  FDA and  has been authorized for detection and/or diagnosis of SARS-CoV-2 by FDA under an Emergency Use Authorization (EUA). This EUA will remain  in effect (meaning this test can be used) for the duration of the COVID-19 declaration under Section 564(b)(1) of the Act, 21 U.S.C.section 360bbb-3(b)(1), unless the authorization is terminated  or revoked sooner.       Influenza A by PCR NEGATIVE NEGATIVE Final   Influenza B by PCR NEGATIVE NEGATIVE Final    Comment: (NOTE) The Xpert Xpress SARS-CoV-2/FLU/RSV plus assay is intended as an aid in the diagnosis of influenza from Nasopharyngeal swab specimens and should not be used as a sole basis for treatment. Nasal washings and aspirates are unacceptable for Xpert Xpress SARS-CoV-2/FLU/RSV testing.  Fact Sheet for Patients: bloggercourse.com  Fact Sheet for Healthcare Providers: seriousbroker.it  This test is not yet approved or cleared by the [place]  FDA and has been authorized for detection and/or diagnosis of SARS-CoV-2 by FDA under an Emergency Use Authorization (EUA). This EUA will remain in effect (meaning this test can be used) for the duration of the COVID-19 declaration under Section 564(b)(1) of the Act, 21 U.S.C. section 360bbb-3(b)(1), unless the authorization is terminated or revoked.  Performed at [facility], [address] [place], [place] [address]     Coagulation  Studies: No results for input(s): LABPROT, INR in the last 72 hours.   Urinalysis: No results for input(s): COLORURINE, LABSPEC, PHURINE, GLUCOSEU, HGBUR, BILIRUBINUR, KETONESUR, PROTEINUR, UROBILINOGEN, NITRITE, LEUKOCYTESUR in the last 72 hours.  Invalid input(s): APPERANCEUR    Imaging: No results found.      Medications:     aspirin  EC  81 mg Oral Daily   atorvastatin   40 mg Oral Daily   calcium  acetate  667 mg Oral TID WC   Chlorhexidine  Gluconate Cloth  6 each Topical Q0600   clopidogrel   75 mg Oral Daily   feeding supplement (NEPRO CARB STEADY)  237 mL Oral BID BM   heparin  injection (subcutaneous)  5,000 Units Subcutaneous Q8H   lidocaine   1 patch Transdermal Q24H   midodrine   5 mg Oral Once per day on Monday Wednesday Friday   pantoprazole   40 mg Oral QHS   sertraline   100 mg Oral Daily   sodium chloride  flush  3 mL Intravenous Once   acetaminophen  **OR** acetaminophen  (TYLENOL ) oral liquid 160 mg/5 mL **OR** acetaminophen , alteplase , ammonium lactate , diclofenac  Sodium,  heparin , ondansetron  (ZOFRAN ) IV, ondansetron , senna-docusate, traMADol   Assessment/ Plan:  Deanna [name] is a 73 y.o.  female with a past medical history of ESRD on dialysis, CVA, coronary artery disease, status post PCI, CHF with reduced ejection fraction, diabetes mellitus, and hypertension, Patient presents to the ED from her dialysis center with acute onset of slurred speech and facial droop there. She was also experiencing worsened weakness/numbness of the right upper extremity relative to her baseline. Code Stroke was called.    [facility]/MWF/Rt PC/74.0 kg   End-stage renal disease on hemodialysis.   Scheduled to receive dialysis later today. Dialysis navigator continues to seek outpatient clinic for rehab placement.   Acute versus subacute ischemic CVA anterior left temporal lobe status post tenecteplase  administration.  History of recurrent  CVA. Loop recorder placed on 08/08/23. Prescribed aspirin  and Plavix  outpatient. CT head-likely acute or subacute nonhemorrhagic infarct in the anterior left temporal lobe measuring up to moderate confluent subcortical white matter hypoattenuation stable.  MRI brain- Acute nonhemorrhagic infarct in the posterior left frontal lobe involving the primary motor cortex, with associated T2/FLAIR hyperintensity. Confluent periventricular and subcortical T2 hyperintensities bilaterally, increased from the prior study. Remote infarcts in the anterior right insular cortex, right frontal lobe operculum, and lateral left temporal lobe are new since the prior exam. Plavix  and aspirin  continued per neurology.    3. Anemia of chronic kidney disease Hemoglobin & Hematocrit     Component Value Date/Time   HGB 10.3 (L) 09/01/2024 0840   HCT 31.5 (L) 09/01/2024 0840    Patient receives Mircera at outpatient clinic.  Will continue to monitor.    4. Secondary Hyperparathyroidism: with outpatient labs: PTH 652, phosphorus 5.2, corr calcium  08/06/2024.  Patient prescribed calcitriol, Cinacalcet and calcium  acetate outpatient.   Update: Bone minerals within optimal range  5.  Hypertension chronic kidney disease  Patient currently not on any antihypertensive medications. Midodrine  5mg  three times daily.       LOS: 19 Jalessa Peyser 12/10/202510:41 AM

## 2024-09-03 NOTE — Plan of Care (Signed)

## 2024-09-03 NOTE — Progress Notes (Addendum)
 PROGRESS NOTE    Deanna [name]  FMW:[id number] DOB: [birth year] DOA: 08/15/2024 PCP: Brutus Delon SAUNDERS, NP  Chief Complaint  Patient presents with   Code Stroke    Hospital Course:  Deanna [name] is a 73 y.o. female with medical history significant for CVA, ESRD on HD, extensive PAD s/p angioplasty and stent, history of HFrEF with recovered EF, hypertension, hyperlipidemia, type 2 diabetes, CAD, DISH arthritis, C3 anterior osteophyte fracture, was brought into the hospital from outpatient dialysis center due to right facial droop and slurred speech. She also had numbness and weakness in the right upper extremity. Patient was seen by neurology and was given TNK and was initially admitted to the ICU.  She has gradually improved with therapy services.  At this time, patient has been stable and awaiting for skilled nursing facility.   Subjective: No acute events overnight.  Patient has no acute complaints this morning.  She is anxious for an update from the [facility] team  Objective: Vitals:   09/03/24 0915 09/03/24 1230 09/03/24 1255 09/03/24 1300  BP: 117/60 (!) 104/47 (!) 111/51 (!) 108/57  Pulse: (!) 102 89 85 90  Resp: 18 20 (!) 27 (!) 29  Temp: 97.8 F (36.6 C) 98 F (36.7 C)    TempSrc: Oral Oral    SpO2: 100%  99% 100%  Weight:      Height:       No intake or output data in the 24 hours ending 09/03/24 1333  Filed Weights   08/29/24 0838 08/29/24 1238 09/01/24 0837  Weight: 70.4 kg 71.6 kg 75.2 kg    Examination: General exam: Appears calm and comfortable, NAD  Respiratory system: No work of breathing, symmetric chest wall expansion Cardiovascular system: S1 & S2 heard, RRR.  Gastrointestinal system: Abdomen is nondistended, soft and nontender.  Neuro: Alert and oriented Psychiatry: Demonstrates appropriate judgement and insight. Mood & affect appropriate for situation.   Assessment & Plan:  Principal Problem:   Stroke (cerebrum) (HCC)    Acute left frontal lobe  nonhemorrhagic CVA - Received TNK at 11/21 at 3:15 PM - Was followed by neurology during this hospitalization - Follow-up MRI scan shows acute nonhemorrhagic infarct in posterior left frontal lobe, as well as remote infarcts in the anterior right insular cortex and right frontal lobe  - Neurology has since signed off - Continue with aspirin , Plavix , statin - PT recommending SNF - Echocardiogram: EF 55 to 60%, grade 1 diastolic dysfunction, regional wall motion cannot be evaluated.  No significant valvular abnormalities noted  ESRD on HD Hyponatremia - Nephrology consulted for ongoing HD during hospital stay  Type 2 diabetes, diet controlled - Last hemoglobin A1c 5.7% - No indication for sliding scale at this time  Hyperlipidemia - Continue statin  Hypotension - On midodrine  at home.  Currently receiving Monday Wednesday Friday with dialysis.  Continue to monitor closely.  Will make daily if needed  PAD status post stent CAD - Continue aspirin  and statin  History of CVA - Continue aspirin  and statin  Overweight BMI 29 - Outpatient follow up for lifestyle modification and risk factor management   DVT prophylaxis: Heparin    Code Status: Full Code Disposition: Needs SNF placement, TOC consulted.  Discharge order placed 11/24 in keeping with CWD protocol  Consultants:    Procedures:    Antimicrobials:  Anti-infectives (From admission, onward)    None       Data Reviewed: I have personally reviewed following labs and imaging studies CBC: Recent Labs  Lab 08/29/24 0850 09/01/24 0840 09/03/24 1254  WBC 10.8* 9.4 9.0  NEUTROABS  --  6.6  --   HGB 11.3* 10.3* 9.7*  HCT 34.3* 31.5* 30.2*  MCV 83.3 84.7 84.8  PLT 366 338 297   Basic Metabolic Panel: Recent Labs  Lab 08/29/24 0850 09/01/24 0840  NA 129* 132*  K 4.2 4.6  CL 88* 90*  CO2 26 25  GLUCOSE 106* 98  BUN 41* 47*  CREATININE 5.87* 6.35*  CALCIUM  9.7 9.9  PHOS 4.6 4.9*   GFR: Estimated  Creatinine Clearance: 7.4 mL/min (A) (by C-G formula based on SCr of 6.35 mg/dL (H)). Liver Function Tests: Recent Labs  Lab 08/29/24 0850 09/01/24 0840  ALBUMIN  4.2 4.2   CBG: No results for input(s): GLUCAP in the last 168 hours.  No results found for this or any previous visit (from the past 240 hours).   Radiology Studies: No results found.   Scheduled Meds:  aspirin  EC  81 mg Oral Daily   atorvastatin   40 mg Oral Daily   calcium  acetate  667 mg Oral TID WC   Chlorhexidine  Gluconate Cloth  6 each Topical Q0600   clopidogrel   75 mg Oral Daily   feeding supplement (NEPRO CARB STEADY)  237 mL Oral BID BM   heparin  injection (subcutaneous)  5,000 Units Subcutaneous Q8H   lidocaine   1 patch Transdermal Q24H   midodrine   5 mg Oral Once per day on Monday Wednesday Friday   pantoprazole   40 mg Oral QHS   sertraline   100 mg Oral Daily   sodium chloride  flush  3 mL Intravenous Once   Continuous Infusions:   LOS: 19 days  Deanna Poland, DO [facility]  To contact the attending physician between 7A-7P please use Epic Chat. To contact the covering physician during after hours 7P-7A, please review Amion.  09/03/2024, 1:33 PM   *This document has been created with the assistance of dictation software. Please excuse typographical errors. *

## 2024-09-03 NOTE — TOC Progression Note (Signed)
 Transition of Care [facility]) - Progression Note    Patient Details  Name: Deanna [name] MRN: [id number] Date of Birth: Feb 15, 1951  [facility]) CM/SW Contact  Daved JONETTA Hamilton, RN Phone Number: 09/03/2024, 1:26 PM  Clinical Narrative:     Notified by Dialysis Coordinator that dialysis chair is available at [facility] for TTS at 12:17. Spoke with E[facility] at [facility] regarding transportation and they can accommodate this. Erie advised they can accept patient on Friday 09/05/24 and plan to transport patient to [facility] on Saturday 12/13 for dialysis.   This CM notified Dialysis Coordinator to move forward with [facility] for TTS at 12:17, informed of the above. Dialysis Coordinator will reach out to provider for instruction on in hospital dialysis with patient changing from MWF to TTS schedule.   This CM requested prior auth be started however, patient has declined the last couple of PT/OT sessions. This CM spoke with patient and reminded her that participation in therapy sessions is required for authorization for patient to go to [facility] for STR, patient verbalized understanding and agreement to participate in therapies.   TOC will continue to follow.      Expected Discharge Plan: Skilled Nursing Facility Barriers to Discharge: Insurance Authorization               Expected Discharge Plan and Services   Discharge Planning Services: CM Consult Post Acute Care Choice: Skilled Nursing Facility Living arrangements for the past 2 months: Assisted Living Facility Expected Discharge Date: 08/18/24                                     Social Drivers of Health (SDOH) Interventions SDOH Screenings   Food Insecurity: Patient Declined (08/17/2024)  Housing: Unknown (08/17/2024)  Transportation Needs: Patient Declined (08/17/2024)  Utilities: Patient Declined (08/17/2024)  Depression (PHQ2-9): Low Risk  (06/12/2024)  Financial Resource  Strain: Low Risk  (07/22/2024)   Received from [facility]  Social Connections: Patient Declined (08/17/2024)  Tobacco Use: Low Risk  (08/15/2024)    Readmission Risk Interventions    01/16/2022    3:45 PM 12/29/2021    2:33 PM  Readmission Risk Prevention Plan  Transportation Screening Complete Complete  PCP or Specialist Appt within 5-7 Days  Complete  Home Care Screening  Complete  Medication Review (RN CM)  Complete  Medication Review (RN Care Manager) Complete   PCP or Specialist appointment within 3-5 days of discharge Complete   HRI or Home Care Consult Complete   SW Recovery Care/Counseling Consult Complete   Palliative Care Screening Not Applicable   Skilled Nursing Facility Not Applicable

## 2024-09-03 NOTE — Progress Notes (Signed)
°   09/03/24 1631  Vitals  Temp (!) 97 F (36.1 C)  Temp Source Oral  BP (!) 115/52  MAP (mmHg) 72  BP Location Left Arm  BP Method Automatic  Patient Position (if appropriate) Lying  Pulse Rate 90  Pulse Rate Source Monitor  ECG Heart Rate 91  Oxygen Therapy  SpO2 100 %  O2 Device Room Air  Patient Activity (if Appropriate) In bed  Pulse Oximetry Type Continuous  Oximetry Probe Site Changed No  During Treatment Monitoring  Blood Flow Rate (mL/min) 199 mL/min  Arterial Pressure (mmHg) -100.2 mmHg  Venous Pressure (mmHg) 27.47 mmHg  TMP (mmHg) 2.22 mmHg  Ultrafiltration Rate (mL/min) 194 mL/min  Dialysate Flow Rate (mL/min) 300 ml/min  Dialysate Potassium Concentration 2  Dialysate Calcium  Concentration 2.5  Duration of HD Treatment -hour(s) 3.5 hour(s)  Cumulative Fluid Removed (mL) per Treatment  0.03  HD Safety Checks Performed Yes  Intra-Hemodialysis Comments Tx completed  Post Treatment  Dialyzer Clearance Lightly streaked  Hemodialysis Intake (mL) 0 mL  Liters Processed 84  Fluid Removed (mL) 0 mL  Tolerated HD Treatment Yes  Hemodialysis Catheter Right Subclavian  Placement Date/Time: 08/15/24 1733   Orientation: Right  Access Location: Subclavian  Site Condition No complications  Blue Lumen Status Flushed;Heparin  locked;Dead end cap in place  Red Lumen Status Flushed;Heparin  locked;Dead end cap in place  Purple Lumen Status N/A  Catheter fill solution Heparin  1000 units/ml  Catheter fill volume (Arterial) 1.6 cc  Catheter fill volume (Venous) 1.6  Dressing Type Transparent  Dressing Status Antimicrobial disc/dressing in place;Clean, Dry, Intact  Interventions New dressing;Dressing changed;Antimicrobial disc changed  Drainage Description None  Dressing Change Due 09/10/24  Post treatment catheter status Capped and Clamped   Pt tolerated tx well. No fliud removal today just cleaning. Bp wnl.

## 2024-09-04 ENCOUNTER — Encounter: Payer: Self-pay | Admitting: Neurology

## 2024-09-04 LAB — HEPATITIS B SURFACE ANTIBODY, QUANTITATIVE: Hep B S AB Quant (Post): 11.3 m[IU]/mL

## 2024-09-04 NOTE — Plan of Care (Signed)

## 2024-09-04 NOTE — Progress Notes (Addendum)
 Progress Note    Deanna [name]  FMW:[id number] DOB: 02/16/1951  DOA: 08/15/2024 PCP: Brutus Delon SAUNDERS, NP      Brief Narrative:    Medical records reviewed and are as summarized below:  Deanna [name] is a 73 y.o. female with medical history significant for CVA, ESRD on HD, extensive PAD s/p angioplasty and stent, history of HFrEF with recovered EF, hypertension, hyperlipidemia, type 2 diabetes, CAD, DISH arthritis, C3 anterior osteophyte fracture, was brought from the outpatient dialysis center to the ED because of right facial droop and slurred speech.  She also complained of numbness and weakness in the right upper extremity.  ED Course: Initial Vital Signs: pulse 92, RR 16, BP 164/79, SpO2 98% on room air Significant Labs: Creatinine 2.7, Alkaline phosphatase 174, hemoglobin 11.2, hematocrit 35.2 Ethyl alcohol <15 Imaging CT Head>>IMPRESSION: 1. Likely acute or subacute nonhemorrhagic infarct in the anterior left temporal lobe measuring up to 3.8 cm. 2. Moderate confluent periventricular and subcortical white matter hypoattenuation, stable. 3. ASPECTS: 8. CT Angio Head & Neck>>IMPRESSION: 1. No acute large vessel occlusion. 2. Extensive predominantly calcified plaque along the proximal right cervical ICA with positive remodeling resulting in less than 50% stenosis.   She was by the neurologist for acute stroke.  She was given TNK at 3:15 PM on 08/15/2024.  She was admitted to the ICU for further management.     Assessment/Plan:   Principal Problem:   Stroke (cerebrum) (HCC)   Body mass index is 31.32 kg/m.   Acute left frontal lobe nonhemorrhagic stroke: S/p tenecteplase  on 08/15/2024 at 3:15 PM. Follow-up MRI brain showed acute nonhemorrhagic infarct in the posterior left frontal lobe. Continue Lipitor, aspirin  and Plavix . Limited 2D echo view because patient was not cooperative. LDL 105, hemoglobin A1c 5.7 Case was discussed with Dr. Voncile,  neurologist, on 09/04/2024.  He recommended 30-day event monitor or loop recorder.  Chart review showed that patient had a loop recorder placed on 08/08/2023 by Dr. Cindie, electrophysiologist.  Case was discussed with Devere Needle, NP with cardiology group, on 08/25/2024.  She said she interrogated her device and there was no evidence of atrial fibrillation on the loop recorder. PT and OT recommended discharge to SNF.   ESRD on HD: Follow-up with nephrologist for hemodialysis On midodrine  for hypotension on dialysis days   Type II DM: NovoLog  as needed for hyperglycemia. Hemoglobin A1c 5.7.   Comorbidities include hyperlipidemia, hypertension, PAD s/p stent, CAD, prior history of strokes, history of C3 anterior osteophyte fracture.   Awaiting placement to SNF.  Follow-up with transition of care team to assist with disposition.    Diet Order             Diet renal with fluid restriction           Diet Heart Room service appropriate? Yes; Fluid consistency: Thin  Diet effective now                                  Consultants: Intensivist Neurologist Nephrologist  Procedures: None    Medications:    aspirin  EC  81 mg Oral Daily   atorvastatin   40 mg Oral Daily   calcium  acetate  667 mg Oral TID WC   Chlorhexidine  Gluconate Cloth  6 each Topical Q0600   clopidogrel   75 mg Oral Daily   feeding supplement (NEPRO CARB STEADY)  237 mL Oral BID BM   heparin   injection (subcutaneous)  5,000 Units Subcutaneous Q8H   lidocaine   1 patch Transdermal Q24H   midodrine   5 mg Oral Once per day on Monday Wednesday Friday   pantoprazole   40 mg Oral QHS   sertraline   100 mg Oral Daily   sodium chloride  flush  3 mL Intravenous Once   Continuous Infusions:   Anti-infectives (From admission, onward)    None              Family Communication/Anticipated D/C date and plan/Code Status   DVT prophylaxis: heparin  injection 5,000 Units Start: 08/20/24  1700 SCD's Start: 08/15/24 1522     Code Status: Full Code  Family Communication: None Disposition Plan: Plan to discharge to SNF   Status is: Inpatient Remains inpatient appropriate because: Acute stroke       Subjective:   Interval events noted.  She has no complaints.  Objective:    Vitals:   09/03/24 1631 09/03/24 1937 09/04/24 0443 09/04/24 0807  BP: (!) 115/52 (!) 98/51 125/60 (!) 122/56  Pulse: 90 86 91 83  Resp:  18 17 18   Temp: (!) 97 F (36.1 C) 97.9 F (36.6 C) 97.8 F (36.6 C) 97.6 F (36.4 C)  TempSrc: Oral Oral Oral   SpO2: 100% 99% 100% 100%  Weight:      Height:       No data found.   Intake/Output Summary (Last 24 hours) at 09/04/2024 1649 Last data filed at 09/04/2024 1449 Gross per 24 hour  Intake 480 ml  Output --  Net 480 ml    Filed Weights   08/29/24 0838 08/29/24 1238 09/01/24 0837  Weight: 70.4 kg 71.6 kg 75.2 kg    Exam:  GEN: NAD SKIN: No rash EYES: EOMI ENT: MMM CV: RRR PULM: CTA B ABD: soft, ND, NT, +BS CNS: AAO x 3, mild right upper extremity weakness (power 4/5) EXT: No edema or tenderness        Data Reviewed:   I have personally reviewed following labs and imaging studies:  Labs: Labs show the following:   Basic Metabolic Panel: Recent Labs  Lab 08/29/24 0850 09/01/24 0840 09/03/24 1254  NA 129* 132* 132*  K 4.2 4.6 4.2  CL 88* 90* 89*  CO2 26 25 28   GLUCOSE 106* 98 138*  BUN 41* 47* 39*  CREATININE 5.87* 6.35* 5.24*  CALCIUM  9.7 9.9 9.5  PHOS 4.6 4.9* 4.1   GFR Estimated Creatinine Clearance: 9 mL/min (A) (by C-G formula based on SCr of 5.24 mg/dL (H)). Liver Function Tests: Recent Labs  Lab 08/29/24 0850 09/01/24 0840 09/03/24 1254  ALBUMIN  4.2 4.2 3.8   No results for input(s): LIPASE, AMYLASE in the last 168 hours. No results for input(s): AMMONIA in the last 168 hours. Coagulation profile No results for input(s): INR, PROTIME in the last 168  hours.   CBC: Recent Labs  Lab 08/29/24 0850 09/01/24 0840 09/03/24 1254  WBC 10.8* 9.4 9.0  NEUTROABS  --  6.6  --   HGB 11.3* 10.3* 9.7*  HCT 34.3* 31.5* 30.2*  MCV 83.3 84.7 84.8  PLT 366 338 297   Cardiac Enzymes: No results for input(s): CKTOTAL, CKMB, CKMBINDEX, TROPONINI in the last 168 hours. BNP (last 3 results) No results for input(s): PROBNP in the last 8760 hours. CBG: No results for input(s): GLUCAP in the last 168 hours.  D-Dimer: No results for input(s): DDIMER in the last 72 hours. Hgb A1c: No results for input(s): HGBA1C in the last  72 hours.  Lipid Profile: No results for input(s): CHOL, HDL, LDLCALC, TRIG, CHOLHDL, LDLDIRECT in the last 72 hours.  Thyroid function studies: No results for input(s): TSH, T4TOTAL, T3FREE, THYROIDAB in the last 72 hours.  Invalid input(s): FREET3 Anemia work up: No results for input(s): VITAMINB12, FOLATE, FERRITIN, TIBC, IRON, RETICCTPCT in the last 72 hours. Sepsis Labs: Recent Labs  Lab 08/29/24 0850 09/01/24 0840 09/03/24 1254  WBC 10.8* 9.4 9.0    Microbiology No results found for this or any previous visit (from the past 240 hours).  Procedures and diagnostic studies:  No results found.              LOS: 20 days   Tierre Netto  [facility]   Pager on [email or web address]. If 7PM-7AM, please contact night-coverage at [email or web address]     09/04/2024, 4:49 PM

## 2024-09-04 NOTE — Progress Notes (Addendum)
 Physical Therapy Treatment Patient Details Name: Deanna [name] MRN: [id number] DOB: January 01, 1951 Today's Date: 09/04/2024   History of Present Illness [name] is a 73 y.o. female with a past medical history of ESRD on dialysis, CAD, CVA, status post PCI, CHF with reduced ejection fraction, DM, HTN. Patient presents to the ED from her dialysis center with acute onset of slurred speech and facial droop there. She was also experiencing worsened weakness/numbness of the right upper extremity relative to her baseline. Found to have acute left frontal lobe nonhemorrhagic stroke: S/p tenecteplase  on 08/15/2024. Follow-up MRI brain showed acute nonhemorrhagic infarct in the posterior left frontal lobe.    PT Comments  PT/OT cotx on this date. Pt received in supine. She required 2+ MIN to mobilize to EOB with vc for scooting closer edge. Pt instructed on lateral WB through RUE to improve sensory input and motor function. She performed left lateral scoots at EOB with 2+ MAX. Performed stand-pivot to [facility] with 2+MAX and cuing for right hand placement. Pt transferred back to bed where she required 2+ MOD to return to supine and 2+ MAX to scoot up in bed. Pt will continue to benefit fro skilled therapy services to address her RUE/RLE weakness secondary to CVA and improve functional mobility following hospital stay.   If plan is discharge home, recommend the following: A lot of help with walking and/or transfers;A lot of help with bathing/dressing/bathroom;Assistance with cooking/housework;Assist for transportation;Help with stairs or ramp for entrance;Direct supervision/assist for medications management;Direct supervision/assist for financial management   Can travel by private vehicle     No  Equipment Recommendations   (TBD at next venue)    Recommendations for Other Services       Precautions / Restrictions Precautions Precautions: Fall Recall of Precautions/Restrictions:  Impaired Restrictions Weight Bearing Restrictions Per Provider Order: No     Mobility  Bed Mobility Overal bed mobility: Needs Assistance Bed Mobility: Supine to Sit, Sit to Supine     Supine to sit: Min assist, +2 for physical assistance Sit to supine: Mod assist, +2 for physical assistance   General bed mobility comments: mod-max vc to scoot closer to EOB    Transfers Overall transfer level: Needs assistance Equipment used: 2 person hand held assist Transfers: Sit to/from Stand, Bed to chair/wheelchair/BSC   Stand pivot transfers: Max assist, +2 physical assistance, From elevated surface        Lateral/Scoot Transfers: +2 physical assistance, Max assist General transfer comment: 2+ max A to scoot up in bed    Ambulation/Gait               General Gait Details: unable   Stairs             Wheelchair Mobility     Tilt Bed    Modified Rankin (Stroke Patients Only)       Balance Overall balance assessment: Needs assistance Sitting-balance support: No upper extremity supported, Feet supported Sitting balance-[name]: Fair Sitting balance - Comments: can tolerate mild challenge     Standing balance-[name]: Zero Standing balance comment: 2+ max to maintain standing balance                            Communication Communication Communication: Impaired Factors Affecting Communication: Reduced clarity of speech  Cognition Arousal: Alert Behavior During Therapy: Flat affect   PT - Cognitive impairments: Awareness, Safety/Judgement, Memory, Sequencing  Following commands: Intact Following commands impaired: Follows one step commands with increased time    Cueing Cueing Techniques: Verbal cues, Tactile cues  Exercises Other Exercises Other Exercises: LAQ with manual resistance, ankle pumps, lateral WB through RUE    General Comments        Pertinent Vitals/Pain      Home Living  Family/patient expects to be discharged to:: Assisted living Living Arrangements: Non-relatives/Friends               Home Equipment: Agricultural Consultant (2 wheels);Wheelchair - power;Wheelchair - manual Additional Comments: Long time resident of ALF, eager to return    Prior Function            PT Goals (current goals can now be found in the care plan section) Acute Rehab PT Goals PT Goal Formulation: With patient Time For Goal Achievement: 09/07/24 Potential to Achieve Goals: Fair Progress towards PT goals: Progressing toward goals    Frequency    Min 2X/week      PT Plan      Co-evaluation PT/OT/SLP Co-Evaluation/Treatment: Yes Reason for Co-Treatment: To address functional/ADL transfers;Necessary to address cognition/behavior during functional activity;For patient/therapist safety PT goals addressed during session: Mobility/safety with mobility;Balance;Proper use of DME;Strengthening/ROM OT goals addressed during session: ADL's and self-care;Proper use of Adaptive equipment and DME      AM-PAC PT 6 Clicks Mobility   Outcome Measure  Help needed turning from your back to your side while in a flat bed without using bedrails?: A Lot Help needed moving from lying on your back to sitting on the side of a flat bed without using bedrails?: A Lot Help needed moving to and from a bed to a chair (including a wheelchair)?: A Lot Help needed standing up from a chair using your arms (e.g., wheelchair or bedside chair)?: A Lot Help needed to walk in hospital room?: Total Help needed climbing 3-5 steps with a railing? : Total 6 Click Score: 10    End of Session   Activity Tolerance: Patient tolerated treatment well;Patient limited by pain Patient left: in bed;with nursing/sitter in room Nurse Communication: Mobility status PT Visit Diagnosis: Other abnormalities of gait and mobility (R26.89);Muscle weakness (generalized) (M62.81);Unsteadiness on feet (R26.81);Other symptoms  and signs involving the nervous system (R29.898);Hemiplegia and hemiparesis Hemiplegia - Right/Left: Right Hemiplegia - dominant/non-dominant: Dominant Hemiplegia - caused by: Cerebral infarction     Time: 8975-8944 PT Time Calculation (min) (ACUTE ONLY): 31 min  Charges:                            Allena Bulls, SPT  Maryanne Finder, PT, DPT Physical Therapist - [facility]  [facility] 09/04/2024, 11:23 AM

## 2024-09-04 NOTE — TOC Progression Note (Addendum)
 Transition of Care [facility]) - Progression Note    Patient Details  Name: Deanna [name] MRN: [id number] Date of Birth: Feb 28, 1951  Transition of Care [facility]) CM/SW Contact  Daved JONETTA Hamilton, RN Phone Number: 09/04/2024, 12:05 PM  Clinical Narrative:     Pending JluyPI:[id number]   UPDATE:  Approved JluyPI:[id number] Dates:12/11-12/15/2025 Next Review Date: 09/08/2024   This CM has reached out to [facility] via vmm, secure message, and secure e-mail to notify of approval. Anticipate patient to discharge to [facility] 09/05/24, [facility] will need to be arranged for transport.   UPDATE: 09/04/24 4:30pm  Received phone call from Talala at [facility], she verbalized they are able to receive patient as planned tomorrow 09/05/24.   Expected Discharge Plan: Skilled Nursing Facility Barriers to Discharge: Insurance Authorization               Expected Discharge Plan and Services   Discharge Planning Services: CM Consult Post Acute Care Choice: Skilled Nursing Facility Living arrangements for the past 2 months: Assisted Living Facility Expected Discharge Date: 08/18/24                                     Social Drivers of Health (SDOH) Interventions SDOH Screenings   Food Insecurity: Patient Declined (08/17/2024)  Housing: Unknown (08/17/2024)  Transportation Needs: Patient Declined (08/17/2024)  Utilities: Patient Declined (08/17/2024)  Depression (PHQ2-9): Low Risk (06/12/2024)  Financial Resource Strain: Low Risk  (07/22/2024)   Received from [facility]  Social Connections: Patient Declined (08/17/2024)  Tobacco Use: Low Risk (08/15/2024)    Readmission Risk Interventions    01/16/2022    3:45 PM 12/29/2021    2:33 PM  Readmission Risk Prevention Plan  Transportation Screening Complete Complete  PCP or Specialist Appt within 5-7 Days  Complete  Home Care Screening  Complete  Medication Review (RN CM)  Complete   Medication Review (RN Care Manager) Complete   PCP or Specialist appointment within 3-5 days of discharge Complete   HRI or Home Care Consult Complete   SW Recovery Care/Counseling Consult Complete   Palliative Care Screening Not Applicable   Skilled Nursing Facility Not Applicable

## 2024-09-04 NOTE — Progress Notes (Signed)
 Occupational Therapy Treatment Patient Details Name: Deanna [name] MRN: [id number] DOB: Aug 16, 1951 Today's Date: 09/04/2024   History of present illness Deanna [name] is a 73 y.o. female with a past medical history of ESRD on dialysis, CAD, CVA, status post PCI, CHF with reduced ejection fraction, DM, HTN. Patient presents to the ED from her dialysis center with acute onset of slurred speech and facial droop there. She was also experiencing worsened weakness/numbness of the right upper extremity relative to her baseline. Found to have acute left frontal lobe nonhemorrhagic stroke: S/p tenecteplase  on 08/15/2024. Follow-up MRI brain showed acute nonhemorrhagic infarct in the posterior left frontal lobe.   OT comments  Pt seen for OT and PT co-tx. Pt agreeable with encouragement. She required MINA  +2 for sup>sit EOB and assist. Pt engaged in dynamic balance activity involving A/P L/R leans with RUE on EOB for WBing and balance with no overt LOB. Pt tolerated sitting EOB to engage in doffing socks, BLE ex with PTS, UB grooming (MOD A for washing face using RUE),and UB bathing/dressing >52min. Pt required MAX A +2 for SPT to [facility] but unsuccessful in urinating. MAX A +2 for return to supine for additional bathing care with NT. Pt progressing towards goals. RUE continues to demonstrate improvement in RUE functional use.       If plan is discharge home, recommend the following:  Assist for transportation;Direct supervision/assist for financial management;Direct supervision/assist for medications management;Assistance with cooking/housework;Two people to help with walking and/or transfers;Two people to help with bathing/dressing/bathroom   Equipment Recommendations  None recommended by OT    Recommendations for Other Services      Precautions / Restrictions Precautions Precautions: Fall Recall of Precautions/Restrictions: Impaired Restrictions Weight Bearing Restrictions Per Provider  Order: No       Mobility Bed Mobility Overal bed mobility: Needs Assistance Bed Mobility: Supine to Sit, Sit to Supine     Supine to sit: Min assist, +2 for physical assistance Sit to supine: Mod assist, +2 for physical assistance        Transfers Overall transfer level: Needs assistance Equipment used: 2 person hand held assist Transfers: Sit to/from Stand, Bed to chair/wheelchair/BSC   Stand pivot transfers: Max assist, From elevated surface, +2 physical assistance        Lateral/Scoot Transfers: Max assist, +2 physical assistance       Balance Overall balance assessment: Needs assistance Sitting-balance support: Feet supported, Single extremity supported Sitting balance-[name]: Fair Sitting balance - Comments: able to utilize RUE on EOB to support balance with shifts for increased WBign through RUE     Standing balance-[name]: Zero                             ADL either performed or assessed with clinical judgement   ADL Overall ADL's : Needs assistance/impaired     Grooming: Sitting;Moderate assistance;Wash/dry face Grooming Details (indicate cue type and reason): MOD A for washing face using R hand Upper Body Bathing: Sitting;Moderate assistance   Lower Body Bathing: Bed level;Maximal assistance   Upper Body Dressing : Sitting;Minimal assistance   Lower Body Dressing: Sitting/lateral leans;Moderate assistance Lower Body Dressing Details (indicate cue type and reason): MOD A for doffing socks Toilet Transfer: BSC/3in1;Maximal assistance;+2 for physical assistance;Stand-pivot   Toileting- Clothing Manipulation and Hygiene: Maximal assistance;Sit to/from stand              Extremity/Trunk Assessment Upper Extremity Assessment Upper Extremity Assessment:  RUE deficits/detail RUE Deficits / Details: increased gross RUE AROM; continues to lack digit AROM secondary to CVA   Lower Extremity Assessment Lower Extremity Assessment:  RLE deficits/detail RLE Deficits / Details: reduced gross strength of RLE secondary to CVA   Cervical / Trunk Assessment Cervical / Trunk Assessment: Kyphotic    Vision       Perception     Praxis     Communication Communication Communication: Impaired Factors Affecting Communication: Reduced clarity of speech   Cognition Arousal: Alert Behavior During Therapy: Flat affect Cognition: No family/caregiver present to determine baseline                               Following commands: Impaired Following commands impaired: Follows one step commands with increased time, Follows multi-step commands with increased time      Cueing   Cueing Techniques: Verbal cues, Tactile cues  Exercises Other Exercises Other Exercises: Pt engaged in dynamic balance activity involving A/P L/R leans with RUE on EOB for WBing and balance    Shoulder Instructions       General Comments      Pertinent Vitals/ Pain       Pain Assessment Pain Location: L foot>LLE Pain Descriptors / Indicators: Grimacing, Guarding, Discomfort Pain Intervention(s): Limited activity within patient's tolerance, Monitored during session, Repositioned, Other (comment) (RN applied pain patch)  Home Living Family/patient expects to be discharged to:: Assisted living Living Arrangements: Non-relatives/Friends                           Home Equipment: Agricultural Consultant (2 wheels);Wheelchair - power;Wheelchair - manual   Additional Comments: Long time resident of ALF, eager to return      Prior Functioning/Environment              Frequency  Min 2X/week        Progress Toward Goals  OT Goals(current goals can now be found in the care plan section)  Progress towards OT goals: Progressing toward goals  Acute Rehab OT Goals Patient Stated Goal: use my hand better OT Goal Formulation: With patient Time For Goal Achievement: 09/12/24 Potential to Achieve Goals: Good  Plan       Co-evaluation    PT/OT/SLP Co-Evaluation/Treatment: Yes Reason for Co-Treatment: To address functional/ADL transfers;Necessary to address cognition/behavior during functional activity;For patient/therapist safety PT goals addressed during session: Mobility/safety with mobility;Balance;Proper use of DME;Strengthening/ROM OT goals addressed during session: ADL's and self-care;Proper use of Adaptive equipment and DME      AM-PAC OT 6 Clicks Daily Activity     Outcome Measure   Help from another person eating meals?: A Little Help from another person taking care of personal grooming?: A Lot Help from another person toileting, which includes using toliet, bedpan, or urinal?: A Lot Help from another person bathing (including washing, rinsing, drying)?: A Lot Help from another person to put on and taking off regular upper body clothing?: A Little Help from another person to put on and taking off regular lower body clothing?: A Lot 6 Click Score: 14    End of Session    OT Visit Diagnosis: Unsteadiness on feet (R26.81);Other abnormalities of gait and mobility (R26.89);Repeated falls (R29.6);Muscle weakness (generalized) (M62.81);Hemiplegia and hemiparesis Hemiplegia - Right/Left: Right Hemiplegia - dominant/non-dominant: Dominant Hemiplegia - caused by: Cerebral infarction   Activity Tolerance Patient tolerated treatment well   Patient Left in bed;with call  bell/phone within reach;Other (comment);with nursing/sitter in room (finishing bath with NT)   Nurse Communication          Time: [phone] OT Time Calculation (min): 31 min  Charges: OT General Charges $OT Visit: 1 Visit OT Treatments $Self Care/Home Management : 8-22 mins  Warren SAUNDERS., MPH, MS, OTR/L ascom [phone] 09/04/2024, 11:06 AM

## 2024-09-04 NOTE — Progress Notes (Signed)
 [facility]  ROUNDING NOTE   Subjective:   Patient is known to our practice and receives outpatient dialysis treatments at [facility] on a MWF schedule, supervised by Dr. Marcelino.   Update:   Patient seen laying in bed Alert Denies pain Room air  Objective:  Vital signs in last 24 hours:  Temp:  [97 F (36.1 C)-98 F (36.7 C)] 97.6 F (36.4 C) (12/11 0807) Pulse Rate:  [83-95] 83 (12/11 0807) Resp:  [14-29] 18 (12/11 0807) BP: (98-125)/(47-64) 122/56 (12/11 0807) SpO2:  [99 %-100 %] 100 % (12/11 0807)  Weight change:  Filed Weights   08/29/24 0838 08/29/24 1238 09/01/24 0837  Weight: 70.4 kg 71.6 kg 75.2 kg    Intake/Output: No intake/output data recorded.   Intake/Output this shift:  Total I/O In: 240 [P.O.:240] Out: -   Physical Exam: General: NAD  Head: Normocephalic  Lungs:  Clear, room air  Heart: Regular   Abdomen:  Soft  Extremities: No peripheral edema. Rt arm weakness  Neurologic: Alert, awake, conversant  Skin: dry  Access: Rt chest Permcath    Basic Metabolic Panel: Recent Labs  Lab 08/29/24 0850 09/01/24 0840 09/03/24 1254  NA 129* 132* 132*  K 4.2 4.6 4.2  CL 88* 90* 89*  CO2 26 25 28   GLUCOSE 106* 98 138*  BUN 41* 47* 39*  CREATININE 5.87* 6.35* 5.24*  CALCIUM  9.7 9.9 9.5  PHOS 4.6 4.9* 4.1    Liver Function Tests: Recent Labs  Lab 08/29/24 0850 09/01/24 0840 09/03/24 1254  ALBUMIN  4.2 4.2 3.8   No results for input(s): LIPASE, AMYLASE in the last 168 hours. No results for input(s): AMMONIA in the last 168 hours.  CBC: Recent Labs  Lab 08/29/24 0850 09/01/24 0840 09/03/24 1254  WBC 10.8* 9.4 9.0  NEUTROABS  --  6.6  --   HGB 11.3* 10.3* 9.7*  HCT 34.3* 31.5* 30.2*  MCV 83.3 84.7 84.8  PLT 366 338 297    Cardiac Enzymes: No results for input(s): CKTOTAL, CKMB, CKMBINDEX, TROPONINI in the last 168 hours.  BNP: Invalid input(s): POCBNP  CBG: No results for input(s): GLUCAP  in the last 168 hours.   Microbiology: Results for orders placed or performed during the hospital encounter of 01/20/22  Blood culture (routine x 2)     Status: None   Collection Time: 01/20/22  7:22 PM   Specimen: BLOOD  Result Value Ref Range Status   Specimen Description BLOOD BLOOD RIGHT HAND  Final   Special Requests   Final    BOTTLES DRAWN AEROBIC AND ANAEROBIC Blood Culture results may not be optimal due to an inadequate volume of blood received in culture bottles   Culture   Final    NO GROWTH 5 DAYS Performed at [facility], [address] [place], [place] [address]    Report Status 01/25/2022 FINAL  Final  Blood culture (routine x 2)     Status: None   Collection Time: 01/20/22  7:22 PM   Specimen: BLOOD  Result Value Ref Range Status   Specimen Description BLOOD BLOOD LEFT HAND  Final   Special Requests   Final    BOTTLES DRAWN AEROBIC AND ANAEROBIC Blood Culture results may not be optimal due to an inadequate volume of blood received in culture bottles   Culture   Final    NO GROWTH 5 DAYS Performed at [facility], [address], [place], [place] [address]    Report Status 01/25/2022  FINAL  Final  Resp Panel by RT-PCR (Flu A&B, Covid) Nasopharyngeal Swab     Status: None   Collection Time: 01/20/22  7:28 PM   Specimen: Nasopharyngeal Swab; Nasopharyngeal(NP) swabs in vial transport medium  Result Value Ref Range Status   SARS Coronavirus 2 by RT PCR NEGATIVE NEGATIVE Final    Comment: (NOTE) SARS-CoV-2 target nucleic acids are NOT DETECTED.  The SARS-CoV-2 RNA is generally detectable in upper respiratory specimens during the acute phase of infection. The lowest concentration of SARS-CoV-2 viral copies this assay can detect is 138 copies/mL. A negative result does not preclude SARS-Cov-2 infection and should not be used as the sole basis for treatment or other patient management decisions. A negative result may occur with  improper  specimen collection/handling, submission of specimen other than nasopharyngeal swab, presence of viral mutation(s) within the areas targeted by this assay, and inadequate number of viral copies(<138 copies/mL). A negative result must be combined with clinical observations, patient history, and epidemiological information. The expected result is Negative.  Fact Sheet for Patients:  bloggercourse.com  Fact Sheet for Healthcare Providers:  seriousbroker.it  This test is no t yet approved or cleared by the [place]  FDA and  has been authorized for detection and/or diagnosis of SARS-CoV-2 by FDA under an Emergency Use Authorization (EUA). This EUA will remain  in effect (meaning this test can be used) for the duration of the COVID-19 declaration under Section 564(b)(1) of the Act, 21 U.S.C.section 360bbb-3(b)(1), unless the authorization is terminated  or revoked sooner.       Influenza A by PCR NEGATIVE NEGATIVE Final   Influenza B by PCR NEGATIVE NEGATIVE Final    Comment: (NOTE) The Xpert Xpress SARS-CoV-2/FLU/RSV plus assay is intended as an aid in the diagnosis of influenza from Nasopharyngeal swab specimens and should not be used as a sole basis for treatment. Nasal washings and aspirates are unacceptable for Xpert Xpress SARS-CoV-2/FLU/RSV testing.  Fact Sheet for Patients: bloggercourse.com  Fact Sheet for Healthcare Providers: seriousbroker.it  This test is not yet approved or cleared by the [place]  FDA and has been authorized for detection and/or diagnosis of SARS-CoV-2 by FDA under an Emergency Use Authorization (EUA). This EUA will remain in effect (meaning this test can be used) for the duration of the COVID-19 declaration under Section 564(b)(1) of the Act, 21 U.S.C. section 360bbb-3(b)(1), unless the authorization is terminated or revoked.  Performed at  [facility], [address] [place], [place] [address]     Coagulation Studies: No results for input(s): LABPROT, INR in the last 72 hours.   Urinalysis: No results for input(s): COLORURINE, LABSPEC, PHURINE, GLUCOSEU, HGBUR, BILIRUBINUR, KETONESUR, PROTEINUR, UROBILINOGEN, NITRITE, LEUKOCYTESUR in the last 72 hours.  Invalid input(s): APPERANCEUR    Imaging: No results found.      Medications:     aspirin  EC  81 mg Oral Daily   atorvastatin   40 mg Oral Daily   calcium  acetate  667 mg Oral TID WC   Chlorhexidine  Gluconate Cloth  6 each Topical Q0600   clopidogrel   75 mg Oral Daily   feeding supplement (NEPRO CARB STEADY)  237 mL Oral BID BM   heparin  injection (subcutaneous)  5,000 Units Subcutaneous Q8H   lidocaine   1 patch Transdermal Q24H   midodrine   5 mg Oral Once per day on Monday Wednesday Friday   pantoprazole   40 mg Oral QHS   sertraline   100 mg Oral Daily   sodium chloride  flush  3  mL Intravenous Once   acetaminophen  **OR** acetaminophen  (TYLENOL ) oral liquid 160 mg/5 mL **OR** acetaminophen , ammonium lactate , diclofenac  Sodium, ondansetron  (ZOFRAN ) IV, ondansetron , senna-docusate, traMADol   Assessment/ Plan:  [name] is a 74 y.o.  female with a past medical history of ESRD on dialysis, CVA, coronary artery disease, status post PCI, CHF with reduced ejection fraction, diabetes mellitus, and hypertension, Patient presents to the ED from her dialysis center with acute onset of slurred speech and facial droop there. She was also experiencing worsened weakness/numbness of the right upper extremity relative to her baseline. Code Stroke was called.    [facility]/MWF/Rt PC/74.0 kg   End-stage renal disease on hemodialysis.   Dialysis received yesterday, UF 0. Next treatment scheduled for Friday.  Dialysis navigator continues to seek outpatient clinic for rehab placement.   Acute versus subacute  ischemic CVA anterior left temporal lobe status post tenecteplase  administration.  History of recurrent CVA. Loop recorder placed on 08/08/23. Prescribed aspirin  and Plavix  outpatient. CT head-likely acute or subacute nonhemorrhagic infarct in the anterior left temporal lobe measuring up to moderate confluent subcortical white matter hypoattenuation stable.  MRI brain- Acute nonhemorrhagic infarct in the posterior left frontal lobe involving the primary motor cortex, with associated T2/FLAIR hyperintensity. Confluent periventricular and subcortical T2 hyperintensities bilaterally, increased from the prior study. Remote infarcts in the anterior right insular cortex, right frontal lobe operculum, and lateral left temporal lobe are new since the prior exam. Plavix  and aspirin  continued per neurology.    3. Anemia of chronic kidney disease Hemoglobin & Hematocrit     Component Value Date/Time   HGB 9.7 (L) 09/03/2024 1254   HCT 30.2 (L) 09/03/2024 1254    Patient receives Mircera at outpatient clinic.  Continue to monitor hgb during this admission.    4. Secondary Hyperparathyroidism: with outpatient labs: PTH 652, phosphorus 5.2, corr calcium  08/06/2024.  Patient prescribed calcitriol, Cinacalcet and calcium  acetate outpatient.   Update: Will continue to monitor bone minerlas  5.  Hypertension chronic kidney disease  Patient currently not on any antihypertensive medications. Midodrine  5mg  three times daily.  Blood pressure has improved with Midodrine .      LOS: 20 Deanna [name] 12/11/202511:55 AM

## 2024-09-04 NOTE — Progress Notes (Signed)
 The patient requested to get up to the [facility]. This clinical research associate and the NT told the patient that we could get the bedpan for her in the bed because the patient is not able to help with the transfer. The patient refused to use the bedpan.

## 2024-09-05 DIAGNOSIS — I639 Cerebral infarction, unspecified: Secondary | ICD-10-CM | POA: Diagnosis not present

## 2024-09-05 NOTE — Progress Notes (Signed)
 D/c orders noted. Contacted out-pt hd clinic, [facility]) TTS 12:15. D/c summary has been faxed over. No further assistance needed at this time.    Suzen Satchel Dialysis Navigator [phone]

## 2024-09-05 NOTE — Plan of Care (Signed)

## 2024-09-05 NOTE — Progress Notes (Signed)
 [facility]  ROUNDING NOTE   Subjective:   Patient is known to our practice and receives outpatient dialysis treatments at [facility] on a MWF schedule, supervised by Dr. Marcelino.   Patient is sitting up in the bed, ready for discharge.  No complaints at this time.   Objective:  Vital signs in last 24 hours:  Temp:  [97.7 F (36.5 C)-98 F (36.7 C)] 97.8 F (36.6 C) (12/12 0811) Pulse Rate:  [81-93] 92 (12/12 0811) Resp:  [16-18] 16 (12/12 0811) BP: (102-124)/(51-59) 122/59 (12/12 0811) SpO2:  [98 %-100 %] 100 % (12/12 0811)  Weight change:  Filed Weights   08/29/24 0838 08/29/24 1238 09/01/24 0837  Weight: 70.4 kg 71.6 kg 75.2 kg    Intake/Output: I/O last 3 completed shifts: In: 720 [P.O.:720] Out: -    Intake/Output this shift:  No intake/output data recorded.  Physical Exam: General: NAD  Head: Normocephalic  Lungs:  Clear, room air  Heart: Regular   Abdomen:  Soft  Extremities: No peripheral edema. Rt arm weakness  Neurologic: Alert, awake, conversant  Skin: dry  Access: Rt chest Permcath    Basic Metabolic Panel: Recent Labs  Lab 09/01/24 0840 09/03/24 1254  NA 132* 132*  K 4.6 4.2  CL 90* 89*  CO2 25 28  GLUCOSE 98 138*  BUN 47* 39*  CREATININE 6.35* 5.24*  CALCIUM  9.9 9.5  PHOS 4.9* 4.1    Liver Function Tests: Recent Labs  Lab 09/01/24 0840 09/03/24 1254  ALBUMIN  4.2 3.8   No results for input(s): LIPASE, AMYLASE in the last 168 hours. No results for input(s): AMMONIA in the last 168 hours.  CBC: Recent Labs  Lab 09/01/24 0840 09/03/24 1254  WBC 9.4 9.0  NEUTROABS 6.6  --   HGB 10.3* 9.7*  HCT 31.5* 30.2*  MCV 84.7 84.8  PLT 338 297    Cardiac Enzymes: No results for input(s): CKTOTAL, CKMB, CKMBINDEX, TROPONINI in the last 168 hours.  BNP: Invalid input(s): POCBNP  CBG: No results for input(s): GLUCAP in the last 168 hours.   Microbiology: Results for orders placed or  performed during the hospital encounter of 01/20/22  Blood culture (routine x 2)     Status: None   Collection Time: 01/20/22  7:22 PM   Specimen: BLOOD  Result Value Ref Range Status   Specimen Description BLOOD BLOOD RIGHT HAND  Final   Special Requests   Final    BOTTLES DRAWN AEROBIC AND ANAEROBIC Blood Culture results may not be optimal due to an inadequate volume of blood received in culture bottles   Culture   Final    NO GROWTH 5 DAYS Performed at [facility], [address] [place], [place] [address]    Report Status 01/25/2022 FINAL  Final  Blood culture (routine x 2)     Status: None   Collection Time: 01/20/22  7:22 PM   Specimen: BLOOD  Result Value Ref Range Status   Specimen Description BLOOD BLOOD LEFT HAND  Final   Special Requests   Final    BOTTLES DRAWN AEROBIC AND ANAEROBIC Blood Culture results may not be optimal due to an inadequate volume of blood received in culture bottles   Culture   Final    NO GROWTH 5 DAYS Performed at [facility], [address] [place], [place] [address]    Report Status 01/25/2022 FINAL  Final  Resp Panel by RT-PCR (Flu A&B, Covid) Nasopharyngeal Swab  Status: None   Collection Time: 01/20/22  7:28 PM   Specimen: Nasopharyngeal Swab; Nasopharyngeal(NP) swabs in vial transport medium  Result Value Ref Range Status   SARS Coronavirus 2 by RT PCR NEGATIVE NEGATIVE Final    Comment: (NOTE) SARS-CoV-2 target nucleic acids are NOT DETECTED.  The SARS-CoV-2 RNA is generally detectable in upper respiratory specimens during the acute phase of infection. The lowest concentration of SARS-CoV-2 viral copies this assay can detect is 138 copies/mL. A negative result does not preclude SARS-Cov-2 infection and should not be used as the sole basis for treatment or other patient management decisions. A negative result may occur with  improper specimen collection/handling, submission of specimen other than  nasopharyngeal swab, presence of viral mutation(s) within the areas targeted by this assay, and inadequate number of viral copies(<138 copies/mL). A negative result must be combined with clinical observations, patient history, and epidemiological information. The expected result is Negative.  Fact Sheet for Patients:  bloggercourse.com  Fact Sheet for Healthcare Providers:  seriousbroker.it  This test is no t yet approved or cleared by the [place]  FDA and  has been authorized for detection and/or diagnosis of SARS-CoV-2 by FDA under an Emergency Use Authorization (EUA). This EUA will remain  in effect (meaning this test can be used) for the duration of the COVID-19 declaration under Section 564(b)(1) of the Act, 21 U.S.C.section 360bbb-3(b)(1), unless the authorization is terminated  or revoked sooner.       Influenza A by PCR NEGATIVE NEGATIVE Final   Influenza B by PCR NEGATIVE NEGATIVE Final    Comment: (NOTE) The Xpert Xpress SARS-CoV-2/FLU/RSV plus assay is intended as an aid in the diagnosis of influenza from Nasopharyngeal swab specimens and should not be used as a sole basis for treatment. Nasal washings and aspirates are unacceptable for Xpert Xpress SARS-CoV-2/FLU/RSV testing.  Fact Sheet for Patients: bloggercourse.com  Fact Sheet for Healthcare Providers: seriousbroker.it  This test is not yet approved or cleared by the [place]  FDA and has been authorized for detection and/or diagnosis of SARS-CoV-2 by FDA under an Emergency Use Authorization (EUA). This EUA will remain in effect (meaning this test can be used) for the duration of the COVID-19 declaration under Section 564(b)(1) of the Act, 21 U.S.C. section 360bbb-3(b)(1), unless the authorization is terminated or revoked.  Performed at [facility], [address] [place], [place]  [address]     Coagulation Studies: No results for input(s): LABPROT, INR in the last 72 hours.   Urinalysis: No results for input(s): COLORURINE, LABSPEC, PHURINE, GLUCOSEU, HGBUR, BILIRUBINUR, KETONESUR, PROTEINUR, UROBILINOGEN, NITRITE, LEUKOCYTESUR in the last 72 hours.  Invalid input(s): APPERANCEUR    Imaging: No results found.      Medications:     aspirin  EC  81 mg Oral Daily   atorvastatin   40 mg Oral Daily   calcium  acetate  667 mg Oral TID WC   Chlorhexidine  Gluconate Cloth  6 each Topical Q0600   clopidogrel   75 mg Oral Daily   feeding supplement (NEPRO CARB STEADY)  237 mL Oral BID BM   heparin  injection (subcutaneous)  5,000 Units Subcutaneous Q8H   lidocaine   1 patch Transdermal Q24H   midodrine   5 mg Oral Once per day on Monday Wednesday Friday   pantoprazole   40 mg Oral QHS   sertraline   100 mg Oral Daily   sodium chloride  flush  3 mL Intravenous Once   acetaminophen  **OR** acetaminophen  (TYLENOL ) oral liquid 160 mg/5 mL **OR** acetaminophen , ammonium  lactate, diclofenac  Sodium, ondansetron  (ZOFRAN ) IV, ondansetron , senna-docusate, traMADol   Assessment/ Plan:  Ms. Sahian Kerney is a 73 y.o.  female with a past medical history of ESRD on dialysis, CVA, coronary artery disease, status post PCI, CHF with reduced ejection fraction, diabetes mellitus, and hypertension, Patient presents to the ED from her dialysis center with acute onset of slurred speech and facial droop there. She was also experiencing worsened weakness/numbness of the right upper extremity relative to her baseline. Code Stroke was called.    [facility]/MWF/Rt PC/74.0 kg   End-stage renal disease on hemodialysis.   Patient to be discharged today to rehab.   New outpatient center: [facility] [address] TTHS schedule, start tomorrow chair time 1215  No recent standing weight is available.  Requested nursing staff to obtain standing weight  today prior to discharge.  Acute versus subacute ischemic CVA anterior left temporal lobe status post tenecteplase  administration.  History of recurrent CVA. Loop recorder placed on 08/08/23. Prescribed aspirin  and Plavix  outpatient. CT head-likely acute or subacute nonhemorrhagic infarct in the anterior left temporal lobe measuring up to moderate confluent subcortical white matter hypoattenuation stable.  MRI brain- Acute nonhemorrhagic infarct in the posterior left frontal lobe involving the primary motor cortex, with associated T2/FLAIR hyperintensity. Confluent periventricular and subcortical T2 hyperintensities bilaterally, increased from the prior study. Remote infarcts in the anterior right insular cortex, right frontal lobe operculum, and lateral left temporal lobe are new since the prior exam. Plavix  and aspirin  continued per neurology.    3. Anemia of chronic kidney disease Hemoglobin & Hematocrit     Component Value Date/Time   HGB 9.7 (L) 09/03/2024 1254   HCT 30.2 (L) 09/03/2024 1254    Patient receives Mircera at outpatient clinic.  Continue to monitor hgb during this admission.    4. Secondary Hyperparathyroidism: with outpatient labs: PTH 652, phosphorus 5.2, corr calcium  08/06/2024.  Patient prescribed calcitriol, Cinacalcet and calcium  acetate outpatient.  Phosphorus 4.1-in normal range.  5.  Hypertension chronic kidney disease  Patient currently not on any antihypertensive medications. Midodrine  5mg  three times daily.  Blood pressure has improved with Midodrine .      LOS: 21 Jamillia Closson 12/12/202511:01 AM

## 2024-09-05 NOTE — Discharge Summary (Addendum)
 Physician Discharge Summary   Patient: Deanna [name] MRN: [id number] DOB: 1951-04-10  Admit date:     08/15/2024  Discharge date: 09/05/2024  Discharge Physician: AIDA CHO   PCP: Brutus Delon SAUNDERS, NP   Recommendations at discharge:   Follow-up with Dr. Fayette, neurologist, within 1 month of discharge Continue outpatient hemodialysis on scheduled days Follow-up with physician at the nursing home within 3 days of discharge  Discharge Diagnoses: Principal Problem:   Stroke (cerebrum) [facility])  Resolved Problems:   * No resolved hospital problems. *  Hospital Course:  Deanna [name] is a 73 y.o. female with medical history significant for CVA, ESRD on HD, extensive PAD s/p angioplasty and stent, history of HFrEF with recovered EF, hypertension, hyperlipidemia, type 2 diabetes, CAD, DISH arthritis, C3 anterior osteophyte fracture, was brought from the outpatient dialysis center to the ED because of right facial droop and slurred speech.  She also complained of numbness and weakness in the right upper extremity.   ED Course: Initial Vital Signs: pulse 92, RR 16, BP 164/79, SpO2 98% on room air Significant Labs: Creatinine 2.7, Alkaline phosphatase 174, hemoglobin 11.2, hematocrit 35.2 Ethyl alcohol <15 Imaging CT Head>>IMPRESSION: 1. Likely acute or subacute nonhemorrhagic infarct in the anterior left temporal lobe measuring up to 3.8 cm. 2. Moderate confluent periventricular and subcortical white matter hypoattenuation, stable. 3. ASPECTS: 8. CT Angio Head & Neck>>IMPRESSION: 1. No acute large vessel occlusion. 2. Extensive predominantly calcified plaque along the proximal right cervical ICA with positive remodeling resulting in less than 50% stenosis.     She was by the neurologist for acute stroke.  She was given TNK at 3:15 PM on 08/15/2024.   She was admitted to the ICU for further management.     Assessment and Plan:   Acute left frontal lobe  nonhemorrhagic stroke: S/p tenecteplase  on 08/15/2024 at 3:15 PM. Follow-up MRI brain showed acute nonhemorrhagic infarct in the posterior left frontal lobe. Continue Lipitor, aspirin  and Plavix . Limited 2D echo view because patient was not cooperative. LDL 105, hemoglobin A1c 5.7 Case was discussed with Dr. Voncile, neurologist, on 09/04/2024.  He recommended 30-day event monitor or loop recorder.  Chart review showed that patient had a loop recorder placed on 08/08/2023 by Dr. Cindie, electrophysiologist.  Case was discussed with Devere Needle, NP with cardiology group, on 09/04/2024.  She said she interrogated her device and there was no evidence of atrial fibrillation on the loop recorder. PT and OT recommended discharge to SNF.     ESRD on HD: Follow-up with nephrologist for hemodialysis as an outpatient. On midodrine  for hypotension on dialysis days     Type II DM: NovoLog  as needed for hyperglycemia. Hemoglobin A1c 5.7.     Comorbidities include hyperlipidemia, hypertension, PAD s/p stent, CAD, prior history of strokes, history of C3 anterior osteophyte fracture.    Her condition has improved and she is deemed stable for discharge today.          Consultants: Neurologist, Intensivist Procedures performed: None  Disposition: Skilled nursing facility Diet recommendation:  Discharge Diet Orders (From admission, onward)     Start     Ordered   08/18/24 0000  Diet renal with fluid restriction        08/18/24 1313           Renal diet DISCHARGE MEDICATION: Allergies as of 09/05/2024       Reactions   Ace Inhibitors Swelling   Entresto  [sacubitril -valsartan ] Swelling   Patient said she took both  lisinopril and Entresto  so it is not clear which one she actually reacted to.  For safety reasons, she has been advised to avoid both ACE inhibitors and Entresto .   Lisinopril Swelling   Severe Angioedema (requiring Nasotracheal intubation)   Peanut-containing Drug Products     Penicillins Rash   Tomato Rash, Other (See Comments)        Medication List     STOP taking these medications    oxyCODONE -acetaminophen  5-325 MG tablet Commonly known as: Percocet       TAKE these medications    ammonium lactate  12 % cream Commonly known as: AMLACTIN Apply 1 Application topically 2 (two) times daily.   aspirin  EC 81 MG tablet Take 81 mg by mouth daily.   atorvastatin  40 MG tablet Commonly known as: LIPITOR Take 40 mg by mouth at bedtime.   Biofreeze Cool The Pain 4 % Gel Generic drug: Menthol (Topical Analgesic) Apply 1 Application topically 4 (four) times daily.   calcium  acetate 667 MG capsule Commonly known as: PHOSLO  Take 1,334 mg by mouth 3 (three) times daily.   clopidogrel  75 MG tablet Commonly known as: PLAVIX  Take 75 mg by mouth at bedtime.   fluticasone  50 MCG/ACT nasal spray Commonly known as: FLONASE  Place 1 spray into both nostrils 2 (two) times daily.   gabapentin  100 MG capsule Commonly known as: NEURONTIN  Take 100 mg by mouth 2 (two) times daily.   metoprolol  tartrate 25 MG tablet Commonly known as: LOPRESSOR  Take 1 tablet (25 mg total) by mouth 2 (two) times daily.   midodrine  5 MG tablet Commonly known as: PROAMATINE  Take 5 mg by mouth 3 (three) times a week. 30 minutes prior to Dialysis on Monday, Wednesday and Friday   Natural Vitamin D-3 125 MCG (5000 UT) Tabs Generic drug: Cholecalciferol Take 5,000 Units by mouth every evening.   ondansetron  4 MG disintegrating tablet Commonly known as: ZOFRAN -ODT Take 4 mg by mouth every 4 (four) hours as needed for nausea or vomiting.   sertraline  100 MG tablet Commonly known as: ZOLOFT  Take 100 mg by mouth daily.        Contact information for follow-up providers     Brutus Delon SAUNDERS, NP Follow up.   Specialty: Adult Health Nurse Practitioner Why: hospital follow up Contact information: [address] [place] [address] [phone]          Lane Arthea BRAVO, MD. Schedule an appointment as soon as possible for a visit in 1 month(s).   Specialty: Neurology Contact information: [phone] [facility] [address] [facility] [place] [address] [phone]              Contact information for after-discharge care     Destination     [facility] [place] .   Service: Skilled Nursing Contact information: [address] [place]  [address] [phone]                    Discharge Exam: Fredricka Weights   08/29/24 9161 08/29/24 1238 09/01/24 0837  Weight: 70.4 kg 71.6 kg 75.2 kg   GEN: NAD SKIN: Warm and dry EYES: No pallor or icterus ENT: MMM CV: RRR PULM: CTA B ABD: soft, ND, NT, +BS CNS: AAO x 3, now with right upper extremity weakness EXT: No edema or tenderness   Condition at discharge: good  The results of significant diagnostics from this hospitalization (including imaging, microbiology, ancillary and laboratory) are listed below for reference.  Imaging Studies: ECHOCARDIOGRAM COMPLETE BUBBLE STUDY Result Date: 09/01/2024    ECHOCARDIOGRAM REPORT   Patient Name:   ALIESHA DOLATA Date of Exam: 09/01/2024 Medical Rec #:  [id number]        Height:       61.0 in Accession #:    [id number]       Weight:       157.8 lb Date of Birth:  July 15, 1951        BSA:          1.708 m Patient Age:    73 years         BP:           109/49 mmHg Patient Gender: F                HR:           86 bpm. Exam Location:  [facility] Procedure: 2D Echo, Cardiac Doppler and Color Doppler (Both Spectral and Color            Flow Doppler were utilized during procedure). Indications:     Stroke 434.91 / I63.9  History:         Patient has prior history of Echocardiogram examinations, most                  recent 08/16/2024. CHF, CAD, Stroke; Risk Factors:Diabetes.  Sonographer:     Christopher Furnace Referring Phys:  [id number] SONA PATEL Diagnosing Phys: Deatrice Cage MD  Sonographer Comments:  Technically challenging study due to limited acoustic windows, no parasternal window and no subcostal window. Pt lying in fetal position on right side. IMPRESSIONS  1. Left ventricular ejection fraction, by estimation, is 55 to 60%. The left ventricle has normal function. Left ventricular endocardial border not optimally defined to evaluate regional wall motion. Left ventricular diastolic parameters are consistent with Grade I diastolic dysfunction (impaired relaxation).  2. Right ventricular systolic function is normal. The right ventricular size is normal. Tricuspid regurgitation signal is inadequate for assessing PA pressure.  3. The mitral valve is normal in structure. No evidence of mitral valve regurgitation. No evidence of mitral stenosis.  4. The aortic valve is normal in structure. Aortic valve regurgitation is not visualized. No aortic stenosis is present. FINDINGS  Left Ventricle: Left ventricular ejection fraction, by estimation, is 55 to 60%. The left ventricle has normal function. Left ventricular endocardial border not optimally defined to evaluate regional wall motion. The left ventricular internal cavity size was normal in size. There is no left ventricular hypertrophy. Left ventricular diastolic parameters are consistent with Grade I diastolic dysfunction (impaired relaxation). Right Ventricle: The right ventricular size is normal. No increase in right ventricular wall thickness. Right ventricular systolic function is normal. Tricuspid regurgitation signal is inadequate for assessing PA pressure. Left Atrium: Left atrial size was normal in size. Right Atrium: Right atrial size was normal in size. Pericardium: There is no evidence of pericardial effusion. Mitral Valve: The mitral valve is normal in structure. Mild mitral annular calcification. No evidence of mitral valve regurgitation. No evidence of mitral valve stenosis. MV peak gradient, 11.0 mmHg. The mean mitral valve gradient is 6.0 mmHg.  Tricuspid Valve: The tricuspid valve is normal in structure. Tricuspid valve regurgitation is not demonstrated. No evidence of tricuspid stenosis. Aortic Valve: The aortic valve is normal in structure. Aortic valve regurgitation is not visualized. No aortic stenosis is present. Aortic valve mean gradient measures 2.5 mmHg. Aortic valve peak gradient measures 4.5 mmHg. Aortic valve  area, by VTI measures 2.18 cm. Pulmonic Valve: The pulmonic valve was normal in structure. Pulmonic valve regurgitation is not visualized. No evidence of pulmonic stenosis. Aorta: The aortic root is normal in size and structure. Venous: The inferior vena cava was not well visualized. IAS/Shunts: No atrial level shunt detected by color flow Doppler.  LEFT VENTRICLE PLAX 2D LVIDd:         3.80 cm   Diastology LVIDs:         2.60 cm   LV e' medial:    6.31 cm/s LV PW:         1.00 cm   LV E/e' medial:  12.1 LV IVS:        0.90 cm   LV e' lateral:   6.53 cm/s LVOT diam:     2.00 cm   LV E/e' lateral: 11.7 LV SV:         38 LV SV Index:   22 LVOT Area:     3.14 cm  RIGHT VENTRICLE RV Basal diam:  3.60 cm RV Mid diam:    2.90 cm LEFT ATRIUM             Index        RIGHT ATRIUM           Index LA diam:        2.70 cm 1.58 cm/m   RA Area:     15.30 cm LA Vol (A2C):   45.5 ml 26.64 ml/m  RA Volume:   41.00 ml  24.00 ml/m LA Vol (A4C):   34.8 ml 20.37 ml/m LA Biplane Vol: 43.1 ml 25.23 ml/m  AORTIC VALVE AV Area (Vmax):    1.89 cm AV Area (Vmean):   1.94 cm AV Area (VTI):     2.18 cm AV Vmax:           106.50 cm/s AV Vmean:          71.250 cm/s AV VTI:            0.173 m AV Peak Grad:      4.5 mmHg AV Mean Grad:      2.5 mmHg LVOT Vmax:         64.00 cm/s LVOT Vmean:        44.100 cm/s LVOT VTI:          0.120 m LVOT/AV VTI ratio: 0.69 MITRAL VALVE                TRICUSPID VALVE MV Area (PHT): 6.54 cm     TR Peak grad:   6.8 mmHg MV Area VTI:   1.42 cm     TR Vmax:        130.00 cm/s MV Peak grad:  11.0 mmHg MV Mean grad:  6.0 mmHg      SHUNTS MV Vmax:       1.66 m/s     Systemic VTI:  0.12 m MV Vmean:      111.0 cm/s   Systemic Diam: 2.00 cm MV Decel Time: 116 msec MV E velocity: 76.30 cm/s MV A velocity: 121.00 cm/s MV E/A ratio:  0.63 Deatrice Cage MD Electronically signed by Deatrice Cage MD Signature Date/Time: 09/01/2024/8:18:51 AM    Final    DG Chest 1 View Result Date: 08/26/2024 EXAM: 1 VIEW(S) XRAY OF THE CHEST 08/26/2024 03:38:00 PM COMPARISON: 08/14/2023 CLINICAL HISTORY: End stage chronic kidney disease (HCC) FINDINGS: LINES, TUBES AND DEVICES: Loop recorder device overlies left chest. Right  central venous catheter with tip at superior cavoatrial junction. LUNGS AND PLEURA: Left lower lung linear opacities, likely atelectasis/scarring. No pleural effusion. No pneumothorax. HEART AND MEDIASTINUM: Atherosclerotic plaque. No acute abnormality of the cardiac and mediastinal silhouettes. BONES AND SOFT TISSUES: No acute osseous abnormality. IMPRESSION: 1. Left lower lung linear opacities, likely atelectasis/scarring. Electronically signed by: Norman Gatlin MD 08/26/2024 08:09 PM EST RP Workstation: [id number]   MR BRAIN WO CONTRAST Result Date: 08/16/2024 EXAM: MRI BRAIN WITHOUT CONTRAST 08/16/2024 03:58:00 PM TECHNIQUE: Multiplanar multisequence MRI of the head/brain was performed without the administration of intravenous contrast. COMPARISON: CT head without contrast 08/15/2024 and MR head without contrast 10/11/2023. CLINICAL HISTORY: Stroke, follow up. Acute onset of slurred speech and facial droop yesterday at dialysis. FINDINGS: BRAIN AND VENTRICLES: An acute nonhemorrhagic infarct in the posterior left frontal lobe measures 2 mm. This involves the primary motor cortex. T2 and FLAIR hyperintensity is associated with the acute/subacute infarct. A remote infarct is present in the anterior right insular cortex and right frontal lobe operculum, new since the prior study. A remote lateral left temporal lobe infarct is new since  the prior exam. Confluent periventricular and subcortical T2 hyperintensities are present bilaterally, increased from the prior study. No intracranial hemorrhage. No mass. No midline shift. No hydrocephalus. The sella is unremarkable. Normal flow voids. ORBITS: No acute abnormality. SINUSES AND MASTOIDS: No acute abnormality. BONES AND SOFT TISSUES: Normal marrow signal. No acute soft tissue abnormality. IMPRESSION: 1. Acute nonhemorrhagic infarct in the posterior left frontal lobe involving the primary motor cortex, with associated T2/FLAIR hyperintensity. 2. Confluent periventricular and subcortical T2 hyperintensities bilaterally, increased from the prior study. 3. Remote infarcts in the anterior right insular cortex, right frontal lobe operculum, and lateral left temporal lobe are new since the prior exam. Electronically signed by: Lonni Necessary MD 08/16/2024 04:27 PM EST RP Workstation: [id number]   ECHOCARDIOGRAM COMPLETE Result Date: 08/16/2024    ECHOCARDIOGRAM REPORT   Patient Name:   AVIS TIRONE Date of Exam: 08/16/2024 Medical Rec #:  [id number]        Height:       61.0 in Accession #:    [id number]       Weight:       157.2 lb Date of Birth:  September 07, 1951        BSA:          1.705 m Patient Age:    73 years         BP:           130/64 mmHg Patient Gender: F                HR:           89 bpm. Exam Location:  [facility] Procedure: 2D Echo (Both Spectral and Color Flow Doppler were utilized during            procedure). Indications:     Stroke I63.9  History:         Patient has prior history of Echocardiogram examinations, most                  recent 08/08/2023.  Sonographer:     Thedora Louder RDCS, FASE Referring Phys:  [phone] ERIC LINDZEN Diagnosing Phys: Caron Poser  Sonographer Comments: No apical window and no subcostal window. Image acquisition challenging due to uncooperative patient and Image acquisition challenging due to patient behavioral factors. IMPRESSIONS  1. The mitral valve  is grossly normal. No evidence of mitral  stenosis by visual assessment.  2. The aortic valve has an indeterminant number of cusps. There is moderate calcification of the aortic valve. No aortic stenosis is present by visual assessment. Comparison(s): Due to patient behavioral factors, only 3 images were able to be obtained. Unable to accurately evaluate LV function, valvular function, or other cardiac causes of CVA. Recommend repeat study when patient is agreeable. FINDINGS  Left Ventricle: Pericardium: There is no evidence of pericardial effusion. Mitral Valve: The mitral valve is grossly normal. No evidence of mitral valve stenosis. Aortic Valve: The aortic valve has an indeterminant number of cusps. There is moderate calcification of the aortic valve. No aortic stenosis is present. Caron Poser Electronically signed by Caron Poser Signature Date/Time: 08/16/2024/10:36:58 AM    Final    CT ANGIO HEAD NECK W WO CM Result Date: 08/15/2024 EXAM: CTA HEAD AND NECK WITHOUT AND WITH 08/15/2024 03:10:25 PM TECHNIQUE: CTA of the head and neck was performed without and with the administration of intravenous contrast. Multiplanar 2D and/or 3D reformatted images are provided for review. Automated exposure control, iterative reconstruction, and/or weight based adjustment of the mA/kV was utilized to reduce the radiation dose to as low as reasonably achievable. Stenosis of the internal carotid arteries measured using NASCET criteria. COMPARISON: CT head 08/15/2024. CLINICAL HISTORY: Stroke/TIA, determine embolic source. FINDINGS: CTA NECK: AORTIC ARCH AND ARCH VESSELS: No dissection or arterial injury. No significant stenosis of the brachiocephalic or subclavian arteries. CERVICAL CAROTID ARTERIES: Extensive predominantly calcified plaque along the proximal right cervical ICA with positive remodeling resulting in less than 50% stenosis. Mild calcified plaque along the left carotid bulb and proximal left cervical ICA  without significant stenosis. No dissection or arterial injury. CERVICAL VERTEBRAL ARTERIES: No dissection, arterial injury, or significant stenosis. LUNGS AND MEDIASTINUM: 5 mm solid nodular opacity in the anterior right upper lobe segment seen on axial image 16 series 4. SOFT TISSUES: No acute abnormality. 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 No high grade spinal canal stenosis. No suspicious bone lesion. CTA HEAD: ANTERIOR CIRCULATION: Atherosclerotic calcifications of the ICA siphons without significant stenosis or aneurysm. No significant stenosis of the anterior cerebral arteries. No significant stenosis of the middle cerebral arteries. POSTERIOR CIRCULATION: No significant stenosis of the posterior cerebral arteries. No significant stenosis of the basilar artery. No significant stenosis of the vertebral arteries. No aneurysm. OTHER: No dural venous sinus thrombosis on this non-dedicated study. IMPRESSION: 1. No acute large vessel occlusion. 2. Extensive predominantly calcified plaque along the proximal right cervical ICA with positive remodeling resulting in less than 50% stenosis. 3. These results were communicated to Dr. Lindzen at 3:18 PM on 08/15/2024 by secure text page via the [facility] messaging system. Electronically signed by: Ryan Chess MD 08/15/2024 03:21 PM EST RP Workstation: [id number]   CT HEAD CODE STROKE WO CONTRAST Result Date: 08/15/2024 EXAM: CT HEAD WITHOUT 08/15/2024 02:38:58 PM TECHNIQUE: CT of the head was performed without the administration of intravenous contrast. Automated exposure control, iterative reconstruction, and/or weight based adjustment of the mA/kV was utilized to reduce the radiation dose to as low as reasonably achievable. COMPARISON: 07/31/2024 CLINICAL HISTORY: Neuro deficit, acute, stroke suspected. FINDINGS: BRAIN AND VENTRICLES: An age indeterminate nonhemorrhagic infarct is present in the anterior  left temporal lobe measuring up to 3.8 cm, likely acute or subacute. Moderate confluent periventricular and subcortical white matter hypoattenuation is otherwise stable. No acute intracranial hemorrhage. No mass effect or midline shift. No extra-axial fluid collection. No hydrocephalus.  Atherosclerotic calcifications are present in the cavernous carotid arteries bilaterally. No hyperdense vessel is present. ORBITS: Bilateral lens replacements are noted. The globes and orbits are otherwise within normal limits. SINUSES AND MASTOIDS: No acute abnormality. SOFT TISSUES AND SKULL: No acute skull fracture. No acute soft tissue abnormality. [place] Stroke Program Early CT Score (ASPECTS) ----- Ganglionic (caudate, IC, lentiform nucleus, insula, M1-M3): 5 Supraganglionic (M4-M6): 3 Total: 8 IMPRESSION: 1. Likely acute or subacute nonhemorrhagic infarct in the anterior left temporal lobe measuring up to 3.8 cm. 2. Moderate confluent periventricular and subcortical white matter hypoattenuation, stable. 3. ASPECTS: 8. 4. These results were communicated to Dr. Lindzen at 2:44 PM on 08/15/2024 by secure text page via the [facility] messaging system. Electronically signed by: Lonni Necessary MD 08/15/2024 02:45 PM EST RP Workstation: [id number]   CUP PACEART REMOTE DEVICE CHECK Result Date: 08/11/2024 ILR summary report received. Battery status OK. Normal device function. No new symptom, tachy, brady, or pause episodes. No new AF episodes. Monthly summary reports and ROV/PRN - CS, CVRS   Microbiology: Results for orders placed or performed during the hospital encounter of 01/20/22  Blood culture (routine x 2)     Status: None   Collection Time: 01/20/22  7:22 PM   Specimen: BLOOD  Result Value Ref Range Status   Specimen Description BLOOD BLOOD RIGHT HAND  Final   Special Requests   Final    BOTTLES DRAWN AEROBIC AND ANAEROBIC Blood Culture results may not be optimal due to an inadequate volume of blood received in  culture bottles   Culture   Final    NO GROWTH 5 DAYS Performed at [facility], [address] [place], [place] [address]    Report Status 01/25/2022 FINAL  Final  Blood culture (routine x 2)     Status: None   Collection Time: 01/20/22  7:22 PM   Specimen: BLOOD  Result Value Ref Range Status   Specimen Description BLOOD BLOOD LEFT HAND  Final   Special Requests   Final    BOTTLES DRAWN AEROBIC AND ANAEROBIC Blood Culture results may not be optimal due to an inadequate volume of blood received in culture bottles   Culture   Final    NO GROWTH 5 DAYS Performed at [facility], [address] [place], [place] [address]    Report Status 01/25/2022 FINAL  Final  Resp Panel by RT-PCR (Flu A&B, Covid) Nasopharyngeal Swab     Status: None   Collection Time: 01/20/22  7:28 PM   Specimen: Nasopharyngeal Swab; Nasopharyngeal(NP) swabs in vial transport medium  Result Value Ref Range Status   SARS Coronavirus 2 by RT PCR NEGATIVE NEGATIVE Final    Comment: (NOTE) SARS-CoV-2 target nucleic acids are NOT DETECTED.  The SARS-CoV-2 RNA is generally detectable in upper respiratory specimens during the acute phase of infection. The lowest concentration of SARS-CoV-2 viral copies this assay can detect is 138 copies/mL. A negative result does not preclude SARS-Cov-2 infection and should not be used as the sole basis for treatment or other patient management decisions. A negative result may occur with  improper specimen collection/handling, submission of specimen other than nasopharyngeal swab, presence of viral mutation(s) within the areas targeted by this assay, and inadequate number of viral copies(<138 copies/mL). A negative result must be combined with clinical observations, patient history, and epidemiological information. The expected result is Negative.  Fact Sheet for Patients:  bloggercourse.com  Fact Sheet for Healthcare Providers:   seriousbroker.it  This test is  no t yet approved or cleared by the [place]  FDA and  has been authorized for detection and/or diagnosis of SARS-CoV-2 by FDA under an Emergency Use Authorization (EUA). This EUA will remain  in effect (meaning this test can be used) for the duration of the COVID-19 declaration under Section 564(b)(1) of the Act, 21 U.S.C.section 360bbb-3(b)(1), unless the authorization is terminated  or revoked sooner.       Influenza A by PCR NEGATIVE NEGATIVE Final   Influenza B by PCR NEGATIVE NEGATIVE Final    Comment: (NOTE) The Xpert Xpress SARS-CoV-2/FLU/RSV plus assay is intended as an aid in the diagnosis of influenza from Nasopharyngeal swab specimens and should not be used as a sole basis for treatment. Nasal washings and aspirates are unacceptable for Xpert Xpress SARS-CoV-2/FLU/RSV testing.  Fact Sheet for Patients: bloggercourse.com  Fact Sheet for Healthcare Providers: seriousbroker.it  This test is not yet approved or cleared by the [place]  FDA and has been authorized for detection and/or diagnosis of SARS-CoV-2 by FDA under an Emergency Use Authorization (EUA). This EUA will remain in effect (meaning this test can be used) for the duration of the COVID-19 declaration under Section 564(b)(1) of the Act, 21 U.S.C. section 360bbb-3(b)(1), unless the authorization is terminated or revoked.  Performed at [facility], [address] [place], [place] [address]     Labs: CBC: Recent Labs  Lab 09/01/24 0840 09/03/24 1254  WBC 9.4 9.0  NEUTROABS 6.6  --   HGB 10.3* 9.7*  HCT 31.5* 30.2*  MCV 84.7 84.8  PLT 338 297   Basic Metabolic Panel: Recent Labs  Lab 09/01/24 0840 09/03/24 1254  NA 132* 132*  K 4.6 4.2  CL 90* 89*  CO2 25 28  GLUCOSE 98 138*  BUN 47* 39*  CREATININE 6.35* 5.24*  CALCIUM  9.9 9.5  PHOS 4.9* 4.1   Liver Function  Tests: Recent Labs  Lab 09/01/24 0840 09/03/24 1254  ALBUMIN  4.2 3.8   CBG: No results for input(s): GLUCAP in the last 168 hours.  Discharge time spent: greater than 30 minutes.  Signed: AIDA CHO, MD [facility] 09/05/2024

## 2024-09-05 NOTE — Progress Notes (Signed)
 Pt report called to [facility] to [facility]  all questions answered

## 2024-09-05 NOTE — TOC Transition Note (Signed)
 Transition of Care [facility]) - Discharge Note   Patient Details  Name: Deanna [name] MRN: [id number] Date of Birth: 1950-10-22  Transition of Care [facility] CM/SW Contact:  Nathanael CHRISTELLA Ring, RN Phone Number: 09/05/2024, 10:30 AM   Clinical Narrative:    Patient can go to [facility] this morning, she will be going to room 504b, bedside RN will call report to [phone].  Notified the facility that the location of the dialysis center has changed to [facility] [address] [place] schedule, start tomorrow chair time 1215 she needs to arrive at 1145.  Confirmed with nephrology that patient does not need to have dialysis today since she will be getting it tomorrow.  [facility] will transport patient to facility she is next in line for pick up.    Final next level of care: Skilled Nursing Facility Barriers to Discharge: Barriers Resolved   Patient Goals and CMS Choice     Choice offered to / list presented to : Patient      Discharge Placement              Patient chooses bed at: [facility] Patient to be transferred to facility by: [facility]   Patient and family notified of of transfer: 09/05/24  Discharge Plan and Services Additional resources added to the After Visit Summary for     Discharge Planning Services: CM Consult Post Acute Care Choice: Skilled Nursing Facility          DME Arranged: N/A           HH Agency: NA        Social Drivers of Health (SDOH) Interventions SDOH Screenings   Food Insecurity: Patient Declined (08/17/2024)  Housing: Unknown (08/17/2024)  Transportation Needs: Patient Declined (08/17/2024)  Utilities: Patient Declined (08/17/2024)  Depression (PHQ2-9): Low Risk (06/12/2024)  Financial Resource Strain: Low Risk  (07/22/2024)   Received from [facility]  Social Connections: Patient Declined (08/17/2024)  Tobacco Use: Low Risk (08/15/2024)     Readmission Risk Interventions    01/16/2022    3:45 PM  12/29/2021    2:33 PM  Readmission Risk Prevention Plan  Transportation Screening Complete Complete  PCP or Specialist Appt within 5-7 Days  Complete  Home Care Screening  Complete  Medication Review (RN CM)  Complete  Medication Review (RN Care Manager) Complete   PCP or Specialist appointment within 3-5 days of discharge Complete   HRI or Home Care Consult Complete   SW Recovery Care/Counseling Consult Complete   Palliative Care Screening Not Applicable   Skilled Nursing Facility Not Applicable

## 2024-09-10 ENCOUNTER — Ambulatory Visit: Attending: Adult Health

## 2024-09-16 ENCOUNTER — Other Ambulatory Visit (INDEPENDENT_AMBULATORY_CARE_PROVIDER_SITE_OTHER): Payer: Self-pay | Admitting: Nurse Practitioner

## 2024-09-16 DIAGNOSIS — N186 End stage renal disease: Secondary | ICD-10-CM

## 2024-09-16 DIAGNOSIS — Z9889 Other specified postprocedural states: Secondary | ICD-10-CM

## 2024-09-23 ENCOUNTER — Encounter (INDEPENDENT_AMBULATORY_CARE_PROVIDER_SITE_OTHER)

## 2024-09-23 ENCOUNTER — Ambulatory Visit (INDEPENDENT_AMBULATORY_CARE_PROVIDER_SITE_OTHER): Admitting: Vascular Surgery

## 2024-10-11 ENCOUNTER — Ambulatory Visit

## 2024-10-16 ENCOUNTER — Ambulatory Visit

## 2024-10-16 DIAGNOSIS — I70219 Atherosclerosis of native arteries of extremities with intermittent claudication, unspecified extremity: Secondary | ICD-10-CM | POA: Diagnosis not present

## 2024-10-17 LAB — CUP PACEART REMOTE DEVICE CHECK
Date Time Interrogation Session: 20260123000327
Implantable Pulse Generator Implant Date: 20241113
Zone Setting Status: 755011
Zone Setting Status: 755011
Zone Setting Status: 755011
Zone Setting Status: 755011

## 2024-10-18 NOTE — Progress Notes (Signed)
 Remote Loop Recorder Transmission

## 2024-10-20 ENCOUNTER — Ambulatory Visit: Payer: Self-pay | Admitting: Cardiology

## 2024-11-11 ENCOUNTER — Ambulatory Visit

## 2024-11-16 ENCOUNTER — Ambulatory Visit

## 2024-12-12 ENCOUNTER — Ambulatory Visit

## 2024-12-17 ENCOUNTER — Ambulatory Visit

## 2025-01-12 ENCOUNTER — Ambulatory Visit

## 2025-01-17 ENCOUNTER — Ambulatory Visit

## 2025-02-17 ENCOUNTER — Ambulatory Visit

## 2025-03-20 ENCOUNTER — Ambulatory Visit

## 2025-04-20 ENCOUNTER — Ambulatory Visit

## 2025-05-21 ENCOUNTER — Ambulatory Visit

## 2025-06-21 ENCOUNTER — Ambulatory Visit

## 2025-07-22 ENCOUNTER — Ambulatory Visit

## 2025-08-22 ENCOUNTER — Ambulatory Visit
